# Patient Record
Sex: Female | Born: 1945
Health system: Southern US, Community
[De-identification: ages and names within clinical notes are randomized; demographics above are authoritative.]

## PROBLEM LIST (undated history)

## (undated) DIAGNOSIS — I10 Essential (primary) hypertension: Secondary | ICD-10-CM

## (undated) DIAGNOSIS — E785 Hyperlipidemia, unspecified: Secondary | ICD-10-CM

## (undated) DIAGNOSIS — F0390 Unspecified dementia without behavioral disturbance: Secondary | ICD-10-CM

## (undated) DIAGNOSIS — I2510 Atherosclerotic heart disease of native coronary artery without angina pectoris: Secondary | ICD-10-CM

## (undated) DIAGNOSIS — M1990 Unspecified osteoarthritis, unspecified site: Secondary | ICD-10-CM

## (undated) DIAGNOSIS — I2584 Coronary atherosclerosis due to calcified coronary lesion: Secondary | ICD-10-CM

## (undated) DIAGNOSIS — N390 Urinary tract infection, site not specified: Secondary | ICD-10-CM

## (undated) DIAGNOSIS — J309 Allergic rhinitis, unspecified: Secondary | ICD-10-CM

## (undated) DIAGNOSIS — I350 Nonrheumatic aortic (valve) stenosis: Secondary | ICD-10-CM

## (undated) DIAGNOSIS — F418 Other specified anxiety disorders: Secondary | ICD-10-CM

## (undated) DIAGNOSIS — J449 Chronic obstructive pulmonary disease, unspecified: Secondary | ICD-10-CM

## (undated) DIAGNOSIS — K635 Polyp of colon: Secondary | ICD-10-CM

## (undated) HISTORY — DX: Essential (primary) hypertension: I10

## (undated) HISTORY — PX: OTHER SURGICAL HISTORY: SHX169

## (undated) HISTORY — DX: Atherosclerotic heart disease of native coronary artery without angina pectoris: I25.10

## (undated) HISTORY — PX: DENTAL SURGERY: SHX609

## (undated) HISTORY — DX: Unspecified osteoarthritis, unspecified site: M19.90

## (undated) HISTORY — DX: Polyp of colon: K63.5

## (undated) HISTORY — DX: Hyperlipidemia, unspecified: E78.5

## (undated) HISTORY — DX: Other specified anxiety disorders: F41.8

## (undated) HISTORY — DX: Urinary tract infection, site not specified: N39.0

## (undated) HISTORY — DX: Chronic obstructive pulmonary disease, unspecified: J44.9

## (undated) HISTORY — DX: Nonrheumatic aortic (valve) stenosis: I35.0

## (undated) HISTORY — PX: TONSILLECTOMY: SUR1361

## (undated) HISTORY — DX: Allergic rhinitis, unspecified: J30.9

## (undated) HISTORY — DX: Coronary atherosclerosis due to calcified coronary lesion: I25.84

---

## 1999-02-17 ENCOUNTER — Other Ambulatory Visit: Admission: RE | Admit: 1999-02-17 | Discharge: 1999-02-17 | Payer: Self-pay | Admitting: Family Medicine

## 2000-10-03 ENCOUNTER — Other Ambulatory Visit: Admission: RE | Admit: 2000-10-03 | Discharge: 2000-10-03 | Payer: Self-pay | Admitting: Family Medicine

## 2001-07-06 ENCOUNTER — Emergency Department (HOSPITAL_COMMUNITY): Admission: EM | Admit: 2001-07-06 | Discharge: 2001-07-06 | Payer: Self-pay | Admitting: Emergency Medicine

## 2001-07-07 ENCOUNTER — Encounter: Payer: Self-pay | Admitting: Emergency Medicine

## 2003-12-30 ENCOUNTER — Other Ambulatory Visit: Admission: RE | Admit: 2003-12-30 | Discharge: 2003-12-30 | Payer: Self-pay | Admitting: Family Medicine

## 2004-09-19 ENCOUNTER — Ambulatory Visit: Payer: Self-pay | Admitting: Family Medicine

## 2005-01-02 ENCOUNTER — Ambulatory Visit: Payer: Self-pay | Admitting: Family Medicine

## 2005-01-16 ENCOUNTER — Ambulatory Visit: Payer: Self-pay | Admitting: Family Medicine

## 2005-07-26 ENCOUNTER — Ambulatory Visit: Payer: Self-pay | Admitting: Family Medicine

## 2005-10-23 ENCOUNTER — Ambulatory Visit: Payer: Self-pay | Admitting: Family Medicine

## 2005-12-05 ENCOUNTER — Ambulatory Visit: Payer: Self-pay | Admitting: Family Medicine

## 2006-01-10 ENCOUNTER — Ambulatory Visit: Payer: Self-pay | Admitting: Family Medicine

## 2006-01-17 ENCOUNTER — Other Ambulatory Visit: Admission: RE | Admit: 2006-01-17 | Discharge: 2006-01-17 | Payer: Self-pay | Admitting: Family Medicine

## 2006-01-17 ENCOUNTER — Ambulatory Visit: Payer: Self-pay | Admitting: Family Medicine

## 2006-01-17 ENCOUNTER — Encounter: Payer: Self-pay | Admitting: Family Medicine

## 2006-01-17 LAB — CONVERTED CEMR LAB

## 2006-01-24 ENCOUNTER — Ambulatory Visit: Payer: Self-pay | Admitting: Family Medicine

## 2006-04-24 ENCOUNTER — Ambulatory Visit: Payer: Self-pay | Admitting: Family Medicine

## 2006-04-24 LAB — CONVERTED CEMR LAB
ALT: 27 units/L (ref 0–40)
AST: 26 units/L (ref 0–37)
Albumin: 3.6 g/dL (ref 3.5–5.2)
Alkaline Phosphatase: 60 units/L (ref 39–117)
Bilirubin, Direct: 0.2 mg/dL (ref 0.0–0.3)
Chol/HDL Ratio, serum: 4
Cholesterol: 208 mg/dL (ref 0–200)
HDL: 51.5 mg/dL (ref >39.0)
LDL DIRECT: 124.5 mg/dL
Total Bilirubin: 0.6 mg/dL (ref 0.3–1.2)
Total Protein: 6.9 g/dL (ref 6.0–8.3)
Triglyceride fasting, serum: 156 mg/dL — ABNORMAL HIGH (ref 0–149)
VLDL: 31 mg/dL (ref 0–40)

## 2006-12-31 ENCOUNTER — Encounter: Payer: Self-pay | Admitting: Family Medicine

## 2006-12-31 DIAGNOSIS — J309 Allergic rhinitis, unspecified: Secondary | ICD-10-CM | POA: Insufficient documentation

## 2006-12-31 DIAGNOSIS — E785 Hyperlipidemia, unspecified: Secondary | ICD-10-CM | POA: Insufficient documentation

## 2007-01-23 ENCOUNTER — Ambulatory Visit: Payer: Self-pay | Admitting: Family Medicine

## 2007-01-23 LAB — CONVERTED CEMR LAB
ALT: 40 units/L — ABNORMAL HIGH (ref 0–35)
AST: 34 units/L (ref 0–37)
Albumin: 3.6 g/dL (ref 3.5–5.2)
Alkaline Phosphatase: 61 units/L (ref 39–117)
BUN: 10 mg/dL (ref 6–23)
Basophils Absolute: 0.1 10*3/uL (ref 0.0–0.1)
Basophils Relative: 1.1 % — ABNORMAL HIGH (ref 0.0–1.0)
Bilirubin, Direct: 0.1 mg/dL (ref 0.0–0.3)
CO2: 27 meq/L (ref 19–32)
Calcium: 8.9 mg/dL (ref 8.4–10.5)
Chloride: 105 meq/L (ref 96–112)
Cholesterol: 189 mg/dL (ref 0–200)
Creatinine, Ser: 0.8 mg/dL (ref 0.4–1.2)
Direct LDL: 96.7 mg/dL
Eosinophils Absolute: 0.3 10*3/uL (ref 0.0–0.6)
Eosinophils Relative: 5.4 % — ABNORMAL HIGH (ref 0.0–5.0)
GFR calc Af Amer: 93 mL/min
GFR calc non Af Amer: 77 mL/min
Glucose, Bld: 79 mg/dL (ref 70–99)
HCT: 40.3 % (ref 36.0–46.0)
HDL: 59.4 mg/dL (ref >39.0)
Hemoglobin: 14.2 g/dL (ref 12.0–15.0)
Lymphocytes Relative: 43.5 % (ref 12.0–46.0)
MCHC: 35.3 g/dL (ref 30.0–36.0)
MCV: 92.7 fL (ref 78.0–100.0)
Monocytes Absolute: 0.5 10*3/uL (ref 0.2–0.7)
Monocytes Relative: 11.2 % — ABNORMAL HIGH (ref 3.0–11.0)
Neutro Abs: 1.8 10*3/uL (ref 1.4–7.7)
Neutrophils Relative %: 38.8 % — ABNORMAL LOW (ref 43.0–77.0)
Platelets: 287 10*3/uL (ref 150–400)
Potassium: 4.2 meq/L (ref 3.5–5.1)
RBC: 4.34 M/uL (ref 3.87–5.11)
RDW: 13.2 % (ref 11.5–14.6)
Sodium: 139 meq/L (ref 135–145)
TSH: 1.89 microintl units/mL (ref 0.35–5.50)
Total Bilirubin: 0.8 mg/dL (ref 0.3–1.2)
Total CHOL/HDL Ratio: 3.2
Total Protein: 6.8 g/dL (ref 6.0–8.3)
Triglycerides: 342 mg/dL (ref 0–149)
VLDL: 68 mg/dL — ABNORMAL HIGH (ref 0–40)
WBC: 4.7 10*3/uL (ref 4.5–10.5)

## 2007-01-30 ENCOUNTER — Ambulatory Visit: Payer: Self-pay | Admitting: Family Medicine

## 2007-01-30 ENCOUNTER — Encounter: Payer: Self-pay | Admitting: Family Medicine

## 2007-01-30 ENCOUNTER — Other Ambulatory Visit: Admission: RE | Admit: 2007-01-30 | Discharge: 2007-01-30 | Payer: Self-pay | Admitting: Family Medicine

## 2007-01-30 DIAGNOSIS — J438 Other emphysema: Secondary | ICD-10-CM | POA: Insufficient documentation

## 2007-02-05 ENCOUNTER — Encounter: Payer: Self-pay | Admitting: Family Medicine

## 2007-06-02 ENCOUNTER — Encounter: Payer: Self-pay | Admitting: Family Medicine

## 2008-02-03 ENCOUNTER — Ambulatory Visit: Payer: Self-pay | Admitting: Family Medicine

## 2008-02-03 LAB — CONVERTED CEMR LAB
Bilirubin Urine: NEGATIVE
Glucose, Urine, Semiquant: NEGATIVE
Ketones, urine, test strip: NEGATIVE
Nitrite: NEGATIVE
Protein, U semiquant: NEGATIVE
Specific Gravity, Urine: 1.01
Urobilinogen, UA: 0.2
WBC Urine, dipstick: NEGATIVE
pH: 5.5

## 2008-02-10 ENCOUNTER — Ambulatory Visit: Payer: Self-pay | Admitting: Family Medicine

## 2008-02-10 DIAGNOSIS — E663 Overweight: Secondary | ICD-10-CM | POA: Insufficient documentation

## 2008-02-10 DIAGNOSIS — F341 Dysthymic disorder: Secondary | ICD-10-CM | POA: Insufficient documentation

## 2008-02-10 DIAGNOSIS — J449 Chronic obstructive pulmonary disease, unspecified: Secondary | ICD-10-CM | POA: Insufficient documentation

## 2008-02-10 LAB — CONVERTED CEMR LAB
ALT: 69 units/L — ABNORMAL HIGH (ref 0–35)
AST: 46 units/L — ABNORMAL HIGH (ref 0–37)
Albumin: 3.8 g/dL (ref 3.5–5.2)
Alkaline Phosphatase: 61 units/L (ref 39–117)
BUN: 12 mg/dL (ref 6–23)
Basophils Absolute: 0 10*3/uL (ref 0.0–0.1)
Basophils Relative: 0.9 % (ref 0.0–3.0)
Bilirubin, Direct: 0.1 mg/dL (ref 0.0–0.3)
CO2: 26 meq/L (ref 19–32)
Calcium: 9.2 mg/dL (ref 8.4–10.5)
Chloride: 103 meq/L (ref 96–112)
Cholesterol: 210 mg/dL (ref 0–200)
Creatinine, Ser: 0.8 mg/dL (ref 0.4–1.2)
Direct LDL: 105.6 mg/dL
Eosinophils Absolute: 0.2 10*3/uL (ref 0.0–0.7)
Eosinophils Relative: 3.3 % (ref 0.0–5.0)
GFR calc Af Amer: 93 mL/min
GFR calc non Af Amer: 77 mL/min
Glucose, Bld: 102 mg/dL — ABNORMAL HIGH (ref 70–99)
HCT: 41.7 % (ref 36.0–46.0)
HDL: 61.5 mg/dL (ref >39.0)
Hemoglobin: 14.4 g/dL (ref 12.0–15.0)
Lymphocytes Relative: 31.8 % (ref 12.0–46.0)
MCHC: 34.5 g/dL (ref 30.0–36.0)
MCV: 97.6 fL (ref 78.0–100.0)
Monocytes Absolute: 0.5 10*3/uL (ref 0.1–1.0)
Monocytes Relative: 9.9 % (ref 3.0–12.0)
Neutro Abs: 2.6 10*3/uL (ref 1.4–7.7)
Neutrophils Relative %: 54.1 % (ref 43.0–77.0)
Platelets: 272 10*3/uL (ref 150–400)
Potassium: 4.4 meq/L (ref 3.5–5.1)
RBC: 4.27 M/uL (ref 3.87–5.11)
RDW: 13.3 % (ref 11.5–14.6)
Sodium: 137 meq/L (ref 135–145)
TSH: 1.3 microintl units/mL (ref 0.35–5.50)
Total Bilirubin: 0.9 mg/dL (ref 0.3–1.2)
Total CHOL/HDL Ratio: 3.4
Total Protein: 7 g/dL (ref 6.0–8.3)
Triglycerides: 221 mg/dL (ref 0–149)
VLDL: 44 mg/dL — ABNORMAL HIGH (ref 0–40)
WBC: 4.9 10*3/uL (ref 4.5–10.5)

## 2008-02-13 ENCOUNTER — Ambulatory Visit: Payer: Self-pay | Admitting: Gastroenterology

## 2008-02-13 LAB — CONVERTED CEMR LAB: Vit D, 1,25-Dihydroxy: 8 — ABNORMAL LOW (ref 30–89)

## 2008-02-23 ENCOUNTER — Encounter: Payer: Self-pay | Admitting: Gastroenterology

## 2008-02-23 ENCOUNTER — Telehealth: Payer: Self-pay | Admitting: Gastroenterology

## 2008-02-27 ENCOUNTER — Ambulatory Visit: Payer: Self-pay | Admitting: Gastroenterology

## 2008-02-27 ENCOUNTER — Encounter: Payer: Self-pay | Admitting: Gastroenterology

## 2008-02-27 LAB — HM COLONOSCOPY

## 2008-03-02 ENCOUNTER — Encounter: Payer: Self-pay | Admitting: Gastroenterology

## 2008-03-16 ENCOUNTER — Telehealth: Payer: Self-pay | Admitting: Family Medicine

## 2008-03-31 ENCOUNTER — Ambulatory Visit: Payer: Self-pay | Admitting: Family Medicine

## 2008-04-06 ENCOUNTER — Ambulatory Visit: Payer: Self-pay | Admitting: Family Medicine

## 2008-04-06 DIAGNOSIS — R062 Wheezing: Secondary | ICD-10-CM | POA: Insufficient documentation

## 2008-04-27 ENCOUNTER — Ambulatory Visit: Payer: Self-pay | Admitting: Family Medicine

## 2008-06-22 ENCOUNTER — Telehealth: Payer: Self-pay | Admitting: Family Medicine

## 2008-06-23 ENCOUNTER — Encounter: Payer: Self-pay | Admitting: Family Medicine

## 2008-07-07 ENCOUNTER — Telehealth: Payer: Self-pay | Admitting: Family Medicine

## 2008-07-08 ENCOUNTER — Ambulatory Visit: Payer: Self-pay | Admitting: Family Medicine

## 2008-10-21 ENCOUNTER — Ambulatory Visit: Payer: Self-pay | Admitting: Family Medicine

## 2008-10-21 DIAGNOSIS — M479 Spondylosis, unspecified: Secondary | ICD-10-CM | POA: Insufficient documentation

## 2008-11-04 ENCOUNTER — Ambulatory Visit: Payer: Self-pay | Admitting: Family Medicine

## 2009-01-05 ENCOUNTER — Telehealth: Payer: Self-pay | Admitting: Family Medicine

## 2009-01-20 ENCOUNTER — Telehealth: Payer: Self-pay | Admitting: Family Medicine

## 2009-02-24 ENCOUNTER — Ambulatory Visit: Payer: Self-pay | Admitting: Family Medicine

## 2009-03-01 ENCOUNTER — Ambulatory Visit: Payer: Self-pay | Admitting: Family Medicine

## 2009-03-01 ENCOUNTER — Other Ambulatory Visit: Admission: RE | Admit: 2009-03-01 | Discharge: 2009-03-01 | Payer: Self-pay | Admitting: Family Medicine

## 2009-03-01 ENCOUNTER — Encounter: Payer: Self-pay | Admitting: Family Medicine

## 2009-03-01 LAB — CONVERTED CEMR LAB
ALT: 28 units/L (ref 0–35)
AST: 27 units/L (ref 0–37)
Albumin: 3.8 g/dL (ref 3.5–5.2)
Alkaline Phosphatase: 56 units/L (ref 39–117)
BUN: 14 mg/dL (ref 6–23)
Basophils Absolute: 0 10*3/uL (ref 0.0–0.1)
Basophils Relative: 0.7 % (ref 0.0–3.0)
Bilirubin, Direct: 0 mg/dL (ref 0.0–0.3)
CO2: 27 meq/L (ref 19–32)
Calcium: 9.2 mg/dL (ref 8.4–10.5)
Chloride: 106 meq/L (ref 96–112)
Cholesterol: 183 mg/dL (ref 0–200)
Creatinine, Ser: 0.9 mg/dL (ref 0.4–1.2)
Eosinophils Absolute: 0.2 10*3/uL (ref 0.0–0.7)
Eosinophils Relative: 3.8 % (ref 0.0–5.0)
GFR calc non Af Amer: 66.69 mL/min (ref >60)
Glucose, Bld: 112 mg/dL — ABNORMAL HIGH (ref 70–99)
Glucose, Urine, Semiquant: NEGATIVE
HCT: 43.3 % (ref 36.0–46.0)
HDL: 54.1 mg/dL (ref >39.00)
Hemoglobin: 14.5 g/dL (ref 12.0–15.0)
Ketones, urine, test strip: NEGATIVE
LDL Cholesterol: 102 mg/dL — ABNORMAL HIGH (ref 0–99)
Lymphocytes Relative: 36.6 % (ref 12.0–46.0)
Lymphs Abs: 2.3 10*3/uL (ref 0.7–4.0)
MCHC: 33.4 g/dL (ref 30.0–36.0)
MCV: 100.9 fL — ABNORMAL HIGH (ref 78.0–100.0)
Monocytes Absolute: 0.7 10*3/uL (ref 0.1–1.0)
Monocytes Relative: 10.5 % (ref 3.0–12.0)
Neutro Abs: 3.2 10*3/uL (ref 1.4–7.7)
Neutrophils Relative %: 48.4 % (ref 43.0–77.0)
Nitrite: NEGATIVE
Platelets: 292 10*3/uL (ref 150.0–400.0)
Potassium: 4.6 meq/L (ref 3.5–5.1)
RBC: 4.29 M/uL (ref 3.87–5.11)
RDW: 12.3 % (ref 11.5–14.6)
Sodium: 140 meq/L (ref 135–145)
Specific Gravity, Urine: 1.025
TSH: 1.05 microintl units/mL (ref 0.35–5.50)
Total Bilirubin: 0.6 mg/dL (ref 0.3–1.2)
Total CHOL/HDL Ratio: 3
Total Protein: 7 g/dL (ref 6.0–8.3)
Triglycerides: 134 mg/dL (ref 0.0–149.0)
Urobilinogen, UA: 0.2
VLDL: 26.8 mg/dL (ref 0.0–40.0)
WBC: 6.4 10*3/uL (ref 4.5–10.5)
pH: 5.5

## 2009-03-01 LAB — HM PAP SMEAR

## 2009-04-11 LAB — CONVERTED CEMR LAB: Pap Smear: NORMAL

## 2009-07-24 ENCOUNTER — Emergency Department (HOSPITAL_COMMUNITY): Admission: EM | Admit: 2009-07-24 | Discharge: 2009-07-24 | Payer: Self-pay | Admitting: Emergency Medicine

## 2009-08-31 ENCOUNTER — Encounter: Payer: Self-pay | Admitting: Family Medicine

## 2010-01-11 ENCOUNTER — Telehealth: Payer: Self-pay | Admitting: Family Medicine

## 2010-02-23 ENCOUNTER — Ambulatory Visit: Payer: Self-pay | Admitting: Family Medicine

## 2010-03-01 LAB — CONVERTED CEMR LAB
ALT: 23 units/L (ref 0–35)
AST: 23 units/L (ref 0–37)
Albumin: 3.9 g/dL (ref 3.5–5.2)
Alkaline Phosphatase: 43 units/L (ref 39–117)
BUN: 17 mg/dL (ref 6–23)
Basophils Absolute: 0.1 10*3/uL (ref 0.0–0.1)
Basophils Relative: 1 % (ref 0.0–3.0)
Bilirubin, Direct: 0.1 mg/dL (ref 0.0–0.3)
CO2: 23 meq/L (ref 19–32)
Calcium: 8.9 mg/dL (ref 8.4–10.5)
Chloride: 102 meq/L (ref 96–112)
Cholesterol: 212 mg/dL — ABNORMAL HIGH (ref 0–200)
Creatinine, Ser: 0.8 mg/dL (ref 0.4–1.2)
Direct LDL: 139.2 mg/dL
Eosinophils Absolute: 0.1 10*3/uL (ref 0.0–0.7)
Eosinophils Relative: 1.9 % (ref 0.0–5.0)
GFR calc non Af Amer: 72.99 mL/min (ref >60)
Glucose, Bld: 99 mg/dL (ref 70–99)
Glucose, Urine, Semiquant: NEGATIVE
HCT: 41.2 % (ref 36.0–46.0)
HDL: 48.8 mg/dL (ref >39.00)
Hemoglobin: 13.9 g/dL (ref 12.0–15.0)
Ketones, urine, test strip: NEGATIVE
Lymphocytes Relative: 28 % (ref 12.0–46.0)
Lymphs Abs: 1.8 10*3/uL (ref 0.7–4.0)
MCHC: 33.8 g/dL (ref 30.0–36.0)
MCV: 98.8 fL (ref 78.0–100.0)
Monocytes Absolute: 0.5 10*3/uL (ref 0.1–1.0)
Monocytes Relative: 7.6 % (ref 3.0–12.0)
Neutro Abs: 3.8 10*3/uL (ref 1.4–7.7)
Neutrophils Relative %: 61.5 % (ref 43.0–77.0)
Nitrite: NEGATIVE
Platelets: 318 10*3/uL (ref 150.0–400.0)
Potassium: 4.2 meq/L (ref 3.5–5.1)
RBC: 4.17 M/uL (ref 3.87–5.11)
RDW: 13.5 % (ref 11.5–14.6)
Sodium: 135 meq/L (ref 135–145)
Specific Gravity, Urine: 1.02
TSH: 0.9 microintl units/mL (ref 0.35–5.50)
Total Bilirubin: 0.8 mg/dL (ref 0.3–1.2)
Total CHOL/HDL Ratio: 4
Total Protein: 6.4 g/dL (ref 6.0–8.3)
Triglycerides: 167 mg/dL — ABNORMAL HIGH (ref 0.0–149.0)
Urobilinogen, UA: 0.2
VLDL: 33.4 mg/dL (ref 0.0–40.0)
WBC: 6.3 10*3/uL (ref 4.5–10.5)
pH: 5.5

## 2010-05-01 ENCOUNTER — Telehealth: Payer: Self-pay | Admitting: Family Medicine

## 2010-05-30 ENCOUNTER — Telehealth: Payer: Self-pay | Admitting: Internal Medicine

## 2010-05-30 ENCOUNTER — Telehealth: Payer: Self-pay | Admitting: Family Medicine

## 2010-06-05 ENCOUNTER — Telehealth: Payer: Self-pay | Admitting: Family Medicine

## 2010-06-20 ENCOUNTER — Ambulatory Visit: Payer: Self-pay | Admitting: Family Medicine

## 2010-06-20 DIAGNOSIS — R55 Syncope and collapse: Secondary | ICD-10-CM | POA: Insufficient documentation

## 2010-06-20 DIAGNOSIS — N390 Urinary tract infection, site not specified: Secondary | ICD-10-CM | POA: Insufficient documentation

## 2010-06-20 LAB — CONVERTED CEMR LAB
Blood in Urine, dipstick: NEGATIVE
Glucose, Urine, Semiquant: NEGATIVE
Nitrite: NEGATIVE
Specific Gravity, Urine: 1.02
Urobilinogen, UA: 0.2
WBC Urine, dipstick: NEGATIVE
pH: 5.5

## 2010-06-20 LAB — HM MAMMOGRAPHY

## 2010-07-11 ENCOUNTER — Telehealth: Payer: Self-pay | Admitting: Family Medicine

## 2010-08-08 NOTE — Progress Notes (Signed)
Summary: Alprazolam refill  Phone Note Refill Request Message from:  Fax from Pharmacy on May 30, 2010 9:58 AM  Refills Requested: Medication #1:  ALPRAZOLAM 0.25 MG TABS Take 1 tablet by mouth three times a day as needed stress   Dosage confirmed as above?Dosage Confirmed  Medication #2:  HYDROCODONE-ACETAMINOPHEN 10-650 MG TABS 1 q4-6 as needed pain not to exceed 4 per day   Dosage confirmed as above?Dosage Confirmed Please advise the Alprazolam refill? The Hydrocodone is to early! The last RX was wrote on 01/31/10 #120X5.  Initial call taken by: Josph Macho RMA,  May 30, 2010 9:59 AM  Follow-up for Phone Call        I agree with no Hydrocodone and OK to give 90 more Alprazolam with same sig and no further refills Follow-up by: Danise Edge MD,  May 30, 2010 10:12 AM    Prescriptions: ALPRAZOLAM 0.25 MG TABS (ALPRAZOLAM) Take 1 tablet by mouth three times a day as needed stress  #90 x 0   Entered by:   Josph Macho RMA   Authorized by:   Danise Edge MD   Signed by:   Josph Macho RMA on 05/30/2010   Method used:   Telephoned to ...       [facility]* (retail)       [address]       [place], [place]  [id number]       Ph: [phone]       Fax: [phone]   RxID:   [id number]  I left message on pharmacy vm/ CF

## 2010-08-08 NOTE — Miscellaneous (Signed)
  Clinical Lists Changes  Medications: Added new medication of MOVIPREP 100 GM  SOLR (PEG-KCL-NACL-NASULF-NA ASC-C) As per prep instructions. - Signed Rx of MOVIPREP 100 GM  SOLR (PEG-KCL-NACL-NASULF-NA ASC-C) As per prep instructions.;  #1 x 0;  Signed;  Entered by: Barton Fanny RN;  Authorized by: Meryl Dare MD [facility];  Method used: Electronic Observations: Added new observation of NKA: T (02/23/2008 15:17)    Prescriptions: MOVIPREP 100 GM  SOLR (PEG-KCL-NACL-NASULF-NA ASC-C) As per prep instructions.  #1 x 0   Entered by:   Barton Fanny RN   Authorized by:   Meryl Dare MD [facility]   Signed by:   Barton Fanny RN on 02/23/2008   Method used:   Electronically sent to ...       [facility]*       [address]       [place], [place]  [address]       Ph: [phone]       Fax: [phone]   RxID:   [id number]

## 2010-08-08 NOTE — Progress Notes (Signed)
Summary: Requesting OV  Phone Note Call from Patient Call back at Work Phone [phone]   Caller: Patient Call For: Andrea Sheen MD Summary of Call: Pt requesting OV with Dr Scotty Court today or tomorrow in follow-up to illness during Christmas.  Pt was not able to get OV with Dr Scotty Court, so her daughter in law (physician) prescribed Avelox 400 mg X 7 days, she finished this yesterday.  Pt still suffering from hoarseness, cough and major fatigue and would like to be checked out by Dr Scotty Court.  Denies fever, wheezing, SOB. Initial call taken by: Sid Falcon LPN,  July 07, 2008 12:48 PM  Follow-up for Phone Call        ok appt tomorrow Follow-up by: Pura Spice, RN,  July 07, 2008 1:00 PM  Additional Follow-up for Phone Call Additional follow up Details #1::        Scheduled OV 9am tomorrow per Dr Scotty Court Additional Follow-up by: Sid Falcon LPN,  July 07, 2008 1:01 PM

## 2010-08-08 NOTE — Assessment & Plan Note (Signed)
Summary: right arm and shoulder pain//ccm   Vital Signs:  Patient profile:   65 year old female Height:      62.5 inches Weight:      157 pounds BMI:     28.36 O2 Sat:      96 % Temp:     97.9 degrees F Pulse rate:   112 / minute BP sitting:   140 / 84  (left arm)  Vitals Entered By: Pura Spice, RN (October 21, 2008 1:11 PM) CC: pain rt shoulder  --icy hot patches helped    History of Present Illness: Pt relates she has had shoulder pain since last of Feb and increaing in severity, on question find pain also referred from rt lateral neck, thought she injured lifting on objects BP doing fine relates breathing bbetter since using spiriva and symbocort  Allergies (verified): No Known Drug Allergies  Review of Systems      See HPI General:  Denies chills, fatigue, fever, loss of appetite, malaise, sleep disorder, sweats, weakness, and weight loss. ENT:  Denies decreased hearing, difficulty swallowing, ear discharge, earache, hoarseness, nasal congestion, nosebleeds, postnasal drainage, ringing in ears, sinus pressure, and sore throat. CV:  Denies bluish discoloration of lips or nails, chest pain or discomfort, difficulty breathing at night, difficulty breathing while lying down, fainting, fatigue, leg cramps with exertion, lightheadness, near fainting, palpitations, shortness of breath with exertion, swelling of feet, swelling of hands, and weight gain. Resp:  Denies chest discomfort, chest pain with inspiration, cough, coughing up blood, excessive snoring, hypersomnolence, morning headaches, pleuritic, shortness of breath, sputum productive, and wheezing. GI:  Denies abdominal pain, bloody stools, change in bowel habits, constipation, dark tarry stools, diarrhea, excessive appetite, gas, hemorrhoids, indigestion, loss of appetite, nausea, vomiting, vomiting blood, and yellowish skin color. GU:  Denies abnormal vaginal bleeding, decreased libido, discharge, dysuria, genital sores,  hematuria, incontinence, nocturia, urinary frequency, and urinary hesitancy. MS:  Complains of joint pain; referred shoulder pain.  Physical Exam  General:  Well-developed,well-nourished,in no acute distress; alert,appropriate and cooperative throughout examination Neck:  marked tenderness rt lateral cervical spine 4-7 Msk:  shoulder neg, elbow neg Pulses:  R and L carotid,radial,femoral,dorsalis pedis and posterior tibial pulses are full and equal bilaterally   Impression & Recommendations:  Problem # 1:  DEGENERATIVE JOINT DISEASE, CERVICAL SPINE (ICD-721.90) Assessment New  Orders: T-Cervical Spine Comp 4 Views (16109UE) Depo- Medrol 80mg  (J1040) Admin of Therapeutic Inj  intramuscular or subcutaneous (45409) diclofenac 75 mg qd  Problem # 2:  COPD (ICD-496) Assessment: Improved  Her updated medication list for this problem includes:    Singulair 10 Mg Tabs (Montelukast sodium) .Marland Kitchen... Take 1 tablet by mouth once a day    Proair Hfa 108 (90 Base) Mcg/act Aers (Albuterol sulfate) .Marland Kitchen... Tale 2 inhalations three times a day  when needed    Spiriva Handihaler 18 Mcg Caps (Tiotropium bromide monohydrate) ..... One puff at bedtime    Symbicort 160-4.5 Mcg/act Aero (Budesonide-formoterol fumarate) .Marland Kitchen... 2 inhalations bid  Problem # 3:  ELEVATED BLOOD PRESSURE (ICD-796.2) Assessment: Improved  Problem # 4:  EMPHYSEMA NEC (ICD-492.8) Assessment: Improved  Complete Medication List: 1)  Alprazolam 0.25 Mg Tabs (Alprazolam) .... Take 1 tablet by mouth three times a day as needed stress 2)  Aspirin 81 Mg Tbec (Aspirin) .... Take 1 tablet by mouth once a day 3)  Caltrate 600+d Plus 600-400 Mg-unit Tabs (Calcium carbonate-vit d-min) .... Take 1 tablet by mouth twice a day 4)  Crestor  10 Mg Tabs (Rosuvastatin calcium) .... Take 1 tablet by mouth every day 5)  Prozac 20 Mg Caps (Fluoxetine hcl) .... Take 1 capsule by mouth once a day 6)  Singulair 10 Mg Tabs (Montelukast sodium) .... Take  1 tablet by mouth once a day 7)  Zetia 10 Mg Tabs (Ezetimibe) .... Take 1 tablet by mouth once a day 8)  Proair Hfa 108 (90 Base) Mcg/act Aers (Albuterol sulfate) .... Tale 2 inhalations three times a day  when needed 9)  Spiriva Handihaler 18 Mcg Caps (Tiotropium bromide monohydrate) .... One puff at bedtime 10)  Vitamin D 16109 Unit Caps (Ergocalciferol) .Marland Kitchen.. 1 by mouth weekly 11)  Symbicort 160-4.5 Mcg/act Aero (Budesonide-formoterol fumarate) .... 2 inhalations bid 12)  Hydrocodone-homatropine 5-1.5 Mg/2ml Syrp (Hydrocodone-homatropine) .... 2 tsp q4h as needed cough 13)  Levaquin 750 Mg Tabs (Levofloxacin) .Marland Kitchen.. 1 qd 14)  Diclofenac Sodium 75 Mg Tbec (Diclofenac sodium) .Marland Kitchen.. 1 two times a day  for inflammation 15)  Hydrocodone-acetaminophen 10-650 Mg Tabs (Hydrocodone-acetaminophen) .Marland Kitchen.. 1 q4-6 as needed pain  Patient Instructions: 1)  To Xray cervical spine 2)  Depomedrol 120 mg IMstart diclofenac 75 mg two times a day 3)  alternate cod and heat therapy 4)  You need to lose weight. Consider a lower calorie diet and regular exercise.  Prescriptions: HYDROCODONE-ACETAMINOPHEN 10-650 MG TABS (HYDROCODONE-ACETAMINOPHEN) 1 q4-6 as needed pain  #100 x 3   Entered and Authorized by:   Judithann Sheen MD   Signed by:   Judithann Sheen MD on 10/21/2008   Method used:   Print then Give to Patient   RxID:   [id number] SYMBICORT 160-4.5 MCG/ACT AERO (BUDESONIDE-FORMOTEROL FUMARATE) 2 inhalations bid  #1 x 11   Entered and Authorized by:   Judithann Sheen MD   Signed by:   Judithann Sheen MD on 10/21/2008   Method used:   Electronically to        [facility]* (retail)       [address]       [place], [place]  [address]       Ph: [phone]       Fax: [phone]   RxID:   [id number] DICLOFENAC SODIUM 75 MG TBEC (DICLOFENAC SODIUM) 1 two times a day  for inflammation  #60 x 11   Entered and Authorized by:   Judithann Sheen MD   Signed  by:   Judithann Sheen MD on 10/21/2008   Method used:   Electronically to        [facility]* (retail)       [address]       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 Patient Level IV [16109]

## 2010-08-08 NOTE — Assessment & Plan Note (Signed)
Summary: bronchitis?/mhf   Vital Signs:  Patient Profile:   65 Years Old Female Weight:      159 pounds Temp:     98.1 degrees F Pulse rate:   100 / minute BP sitting:   150 / 100  (left arm) Cuff size:   large  Vitals Entered By: Pura Spice, RN (April 06, 2008 2:40 PM)                 Chief Complaint:  ? bronchitis .  History of Present Illness: to recheck BP in 2 weeks In office they have been using chemicals to clean walls and this has caused pt to start wheexzing and coughing, taking usual COPD medications but getting worse with nasal drainagem, discolores and coughing and wheezing, general malaise,exertional dyspnea no other complaints    Current Allergies (reviewed today): No known allergies      Review of Systems      See HPI   Physical Exam  General:     Well-developed,well-nourished,in no acute distress; alert,appropriate and cooperative throughout examinationoverweight-appearing.   Head:     Normocephalic and atraumatic without obvious abnormalities. No apparent alopecia or balding. Eyes:     No corneal or conjunctival inflammation noted. EOMI. Perrla. Funduscopic exam benign, without hemorrhages, exudates or papilledema. Vision grossly normal. Ears:     External ear exam shows no significant lesions or deformities.  Otoscopic examination reveals clear canals, tympanic membranes are intact bilaterally without bulging, retraction, inflammation or discharge. Hearing is grossly normal bilaterally. Nose:     nasal congestion with yellow drainagre Mouth:     Oral mucosa and oropharynx without lesions or exudates.  Teeth in good repair. Lungs:     inspiratory and expiratory wheezes, de creased breathe sounds Heart:     Normal rate and regular rhythm. S1 and S2 normal without gallop, murmur, click, rub or other extra sounds. Abdomen:     Bowel sounds positive,abdomen soft and non-tender without masses, organomegaly or hernias noted. Extremities:    No clubbing, cyanosis, edema, or deformity noted with normal full range of motion of all joints.      Impression & Recommendations:  Problem # 1:  ASTHMATIC BRONCHITIS, ACUTE (ICD-466.0) Assessment: New  Her updated medication list for this problem includes:    Singulair 10 Mg Tabs (Montelukast sodium) .Marland Kitchen... Take 1 tablet by mouth once a day    Proair Hfa 108 (90 Base) Mcg/act Aers (Albuterol sulfate) .Marland Kitchen... Tale 2 inhalations three times a day  when needed    Spiriva Handihaler 18 Mcg Caps (Tiotropium bromide monohydrate) ..... One puff at bedtime    Symbicort 160-4.5 Mcg/act Aero (Budesonide-formoterol fumarate) .Marland Kitchen... 1 inhalation bid    Zithromax Z-pak 250 Mg Tabs (Azithromycin) .Marland Kitchen... 2 stat then 1  qd    Hydrocodone-homatropine 5-1.5 Mg/20ml Syrp (Hydrocodone-homatropine) .Marland Kitchen... 2 tsp q4h as needed cough  Orders: Depo- Medrol 80mg  (J1040) Admin of Therapeutic Inj  intramuscular or subcutaneous (16109)   Problem # 2:  WHEEZING (ICD-786.07) Assessment: New  Problem # 3:  ALLERGIC RHINITIS (ICD-477.9) Assessment: Deteriorated  Problem # 4:  EMPHYSEMA NEC (ICD-492.8) Assessment: Deteriorated  Complete Medication List: 1)  Alprazolam 0.25 Mg Tabs (Alprazolam) .... Take 1 tablet by mouth three times a day as needed stress 2)  Aspirin 81 Mg Tbec (Aspirin) .... Take 1 tablet by mouth once a day 3)  Caltrate 600+d Plus 600-400 Mg-unit Tabs (Calcium carbonate-vit d-min) .... Take 1 tablet by mouth twice a day 4)  Crestor  10 Mg Tabs (Rosuvastatin calcium) .... Take 1 tablet by mouth every day 5)  Prozac 20 Mg Caps (Fluoxetine hcl) .... Take 1 capsule by mouth once a day 6)  Singulair 10 Mg Tabs (Montelukast sodium) .... Take 1 tablet by mouth once a day 7)  Zetia 10 Mg Tabs (Ezetimibe) .... Take 1 tablet by mouth once a day 8)  Proair Hfa 108 (90 Base) Mcg/act Aers (Albuterol sulfate) .... Tale 2 inhalations three times a day  when needed 9)  Spiriva Handihaler 18 Mcg Caps (Tiotropium  bromide monohydrate) .... One puff at bedtime 10)  Vitamin D 04540 Unit Caps (Ergocalciferol) .Marland Kitchen.. 1 by mouth weekly 11)  Symbicort 160-4.5 Mcg/act Aero (Budesonide-formoterol fumarate) .Marland Kitchen.. 1 inhalation bid 12)  Zithromax Z-pak 250 Mg Tabs (Azithromycin) .... 2 stat then 1  qd 13)  Hydrocodone-homatropine 5-1.5 Mg/32ml Syrp (Hydrocodone-homatropine) .... 2 tsp q4h as needed cough   Patient Instructions: 1)  allerigic asthmatic bronchitis 2)  singular , spiriva as nbefore 3)  new symbicort wo/4.5 2 inalations morming and night 4)  wn 5)  en beter go to 1 inhalation 6)  early infection Zpack plus hycodanexpectorant 7)  Depomedrol 120 mg IM 8)  continue to dietand minimize smoking   Prescriptions: HYDROCODONE-HOMATROPINE 5-1.5 MG/5ML SYRP (HYDROCODONE-HOMATROPINE) 2 tsp q4h as needed cough  #280cc x 2   Entered and Authorized by:   Judithann Sheen MD   Signed by:   Judithann Sheen MD on 04/06/2008   Method used:   Print then Give to Patient   RxID:   [id number] ZITHROMAX Z-PAK 250 MG TABS (AZITHROMYCIN) 2 stat then 1  qd  #1 pkg x 0   Entered and Authorized by:   Judithann Sheen MD   Signed by:   Judithann Sheen MD on 04/06/2008   Method used:   Electronically to        [facility]* (retail)       [address]       [place], [place]  [address]       Ph: [phone]       Fax: [phone]   RxID:   [id number] ZETIA 10 MG TABS (EZETIMIBE) Take 1 tablet by mouth once a day  #30 x 11   Entered by:   Pura Spice, RN   Authorized by:   Judithann Sheen MD   Signed by:   Pura Spice, RN on 04/06/2008   Method used:   Telephoned to ...       [facility]* (retail)       [address]       [place], [place]  [address]       Ph: [phone]       Fax: [phone]   RxID:   [id number] PROZAC 20 MG CAPS (FLUOXETINE HCL) Take 1 capsule by mouth once a day  #30 x 11   Entered by:   Pura Spice, RN    Authorized by:   Judithann Sheen MD   Signed by:   Pura Spice, RN on 04/06/2008   Method used:   Telephoned to ...       [facility]* (retail)       [address]       [place], [place]  [address]       Ph: [phone]       Fax: [phone]   RxID:  [id number] CRESTOR 10 MG TABS (ROSUVASTATIN CALCIUM) Take 1 tablet by mouth every day  #30 x 11   Entered by:   Pura Spice, RN   Authorized by:   Judithann Sheen MD   Signed by:   Pura Spice, RN on 04/06/2008   Method used:   Telephoned to ...       [facility]* (retail)       [address]       [place], [place]  [address]       Ph: [phone]       Fax: [phone]   RxID:   [id number] ALPRAZOLAM 0.25 MG TABS (ALPRAZOLAM) Take 1 tablet by mouth three times a day as needed stress  #90 x 5   Entered by:   Pura Spice, RN   Authorized by:   Judithann Sheen MD   Signed by:   Pura Spice, RN on 04/06/2008   Method used:   Telephoned to ...       [facility]* (retail)       [address]       [place], [place]  [address]       Ph: [phone]       Fax: [phone]   RxID:   [id number]  ]  Medication Administration  Injection # 1:    Medication: Depo- Medrol 80mg     Diagnosis: ASTHMATIC BRONCHITIS, ACUTE (ICD-466.0)    Route: IM    Site: RUOQ gluteus    Exp Date: 05/2009    Lot #: [id number]    Mfr: sircor    Comments: 120 mg given     Patient tolerated injection without complications    Given by: Pura Spice, RN (April 06, 2008 4:41 PM)  Orders Added: 1)  Depo- Medrol 80mg  [J1040] 2)  Admin of Therapeutic Inj  intramuscular or subcutaneous [96372] 3)  Est. Patient Level III [17616]

## 2010-08-08 NOTE — Letter (Signed)
Summary: Results Follow up Letter  [place]  [address] [place], [place] [address]   Phone: [phone]  Fax: [phone]    02/05/2007 MRN: [id number]  [facility] [address] [place], [place]  [address]  Dear Andrea Larson,  The following are the results of your recent test(s):  Test         Result    Pap Smear:        Normal _____  Not Normal _____ Comments: ______________________________________________________ Cholesterol: LDL(Bad cholesterol):         Your goal is less than:         HDL (Good cholesterol):       Your goal is more than: Comments:  ______________________________________________________ Mammogram:        Normal _____  Not Normal _____ Comments:  ___________________________________________________________________ Hemoccult:        Normal _____  Not normal _______ Comments:    _____________________________________________________________________ Other Tests:    We routinely do not discuss normal results over the telephone.  If you desire a copy of the results, or you have any questions about this information we can discuss them at your next office visit.   Sincerely,

## 2010-08-08 NOTE — Progress Notes (Signed)
Summary: refill on hydrocodone-too earlier  Phone Note From Pharmacy   Caller: [facility]* Reason for Call: Needs renewal Details for Reason: Hydrocodone/apap 10/650 Summary of Call: Pt got #120 on 7/26, 8/19, 9/11, 10/4 and 10/28. She wants another refill.  Follow-up for Phone Call        Denied rx Pt got it filled on 8/19-6 days early; then on 9/11-6 days early; 10/4-8 days early; 10/28-6 days early. Pt should have enough for 26 more days. She is only suppose to take 4 a day. This would be a 30 day supply. Follow-up by: Romualdo Bolk, CMA Duncan Dull),  May 30, 2010 4:39 PM

## 2010-08-08 NOTE — Progress Notes (Signed)
Summary: Alprazolam refill  Phone Note Refill Request Message from:  Fax from Pharmacy on May 01, 2010 1:26 PM  Refills Requested: Medication #1:  ALPRAZOLAM 0.25 MG TABS Take 1 tablet by mouth three times a day as needed stress   Dosage confirmed as above?Dosage Confirmed Please advise refill?  Initial call taken by: Josph Macho RMA,  May 01, 2010 1:27 PM  Follow-up for Phone Call        OK to give with same sig #90, no rf Follow-up by: Danise Edge MD,  May 01, 2010 1:55 PM    Prescriptions: ALPRAZOLAM 0.25 MG TABS (ALPRAZOLAM) Take 1 tablet by mouth three times a day as needed stress  #90 x 0   Entered by:   Josph Macho RMA   Authorized by:   Danise Edge MD   Signed by:   Josph Macho RMA on 05/02/2010   Method used:   Telephoned to ...       [facility]* (retail)       [address]       [place], [place]  [address]       Ph: [phone]       Fax: [phone]   RxID:   [id number]

## 2010-08-08 NOTE — Progress Notes (Signed)
Summary: rx hydrocoodne w 5 refills   Phone Note From Pharmacy   Caller: [facility]* Reason for Call: Needs renewal Summary of Call: refill hydrocodone 10/650  Initial call taken by: Pura Spice, RN,  January 20, 2009 3:01 PM  Follow-up for Phone Call        ok per dr Scotty Court and faxed rx Follow-up by: Pura Spice, RN,  January 20, 2009 3:01 PM    New/Updated Medications: HYDROCODONE-ACETAMINOPHEN 10-650 MG TABS (HYDROCODONE-ACETAMINOPHEN) 1 q4-6 as needed pain not to exceed 4 per day Prescriptions: HYDROCODONE-ACETAMINOPHEN 10-650 MG TABS (HYDROCODONE-ACETAMINOPHEN) 1 q4-6 as needed pain not to exceed 4 per day  #100 x 5   Entered by:   Pura Spice, RN   Authorized by:   Judithann Sheen MD   Signed by:   Pura Spice, RN on 01/20/2009   Method used:   Printed then faxed to ...       [facility]* (retail)       [address]       [place], [place]  [address]       Ph: [phone]       Fax: [phone]   RxID:   [id number]

## 2010-08-08 NOTE — Procedures (Signed)
Summary: Colonoscopy   Colonoscopy  Procedure date:  02/27/2008  Findings:      Location:   [facility].    Procedures Next Due Date:    Colonoscopy: 02/2013  Patient Name: Andrea, Larson MRN:  Procedure Procedures: Colonoscopy CPT: 16109.    with biopsy. CPT: Q5068410.    with polypectomy. CPT: A3573898.  Personnel: Endoscopist: Venita Lick. Russella Dar, MD, Clementeen Graham.  Exam Location: Exam performed in Outpatient Clinic. Outpatient  Patient Consent: Procedure, Alternatives, Risks and Benefits discussed, consent obtained, from patient. Consent was obtained by the RN.  Indications  Surveillance of: Adenomatous Polyp(s). Initial polypectomy was performed in 2005. in Aug.  Increased Risk Screening: Family History of Polyps.  History  Current Medications: Patient is not currently taking Coumadin.  Pre-Exam Physical: Performed Feb 27, 2008. Cardio-pulmonary exam, Rectal exam, HEENT exam , Abdominal exam, Mental status exam WNL.  Comments: Pt. history reviewed/updated, physical exam performed prior to initiation of sedation?Yes Exam Exam: Extent of exam reached: Cecum, extent intended: Cecum.  The cecum was identified by appendiceal orifice and IC valve. Time to Cecum: 00:13: 01. Time for Withdrawl: 00:12:47. Colon retroflexion performed. Images taken. ASA Classification: II. Tolerance: excellent.  Monitoring: Pulse and BP monitoring, Oximetry used. Supplemental O2 given.  Colon Prep Used MoviPrep for colon prep. Prep results: good.  Sedation Meds: Patient assessed and found to be appropriate for moderate (conscious) sedation. Fentanyl 125 mcg. given IV. Versed 12 given IV. Benadryl 50mg  given IV.  Findings POLYP: Descending Colon, Maximum size: 10 mm. sessile polyp. Procedure:  snare with cautery, The polyp was removed piece meal. removed, retrieved, Polyp sent to pathology. ICD9: Colon Polyps: 211. 3.  NORMAL EXAM: Ascending Colon to Transverse Colon.  POLYP:  Descending Colon, Maximum size: 5 mm. sessile polyp. Procedure:  snare without cautery, removed, retrieved, sent to pathology. ICD9: Colon Polyps: 211.3.  - DIVERTICULOSIS: Descending Colon to Sigmoid Colon. Not bleeding. ICD9: Diverticulosis: 562.10.  POLYP: Cecum, Maximum size: 4 mm. sessile polyp. Procedure:  biopsy without cautery, removed, retrieved, sent to pathology. ICD9: Colon Polyps: 211.3.  NORMAL EXAM: Rectum.    Comments: tortuous and fixed colon Assessment  Diagnoses: 562.10: Diverticulosis.  211.3: Colon Polyps.   Events  Unplanned Interventions: No intervention was required.  Unplanned Events: There were no complications. Plans  Post Exam Instructions: Post sedation instructions given. No aspirin or non-steroidal containing medications: 2 weeks.  Medication Plan: Await pathology.  Patient Education: Patient given standard instructions for: Polyps. Diverticulosis.  Disposition: After procedure patient sent to recovery. After recovery patient sent home.  Scheduling/Referral: Colonoscopy, to [facility] T. Russella Dar, MD, [facility], around Feb 26, 2013.    cc:  Dianna Limbo, MD  REPORT OF SURGICAL PATHOLOGY   Case #: [id number] Patient Name: Andrea, Larson. Office Chart Number:  N/A   MRN: [id number] Pathologist: H. Hollice Espy, MD DOB/Age  Dec 19, 1945 (Age: 65)    Gender: F Date Taken:  02/27/2008 Date Received: 03/01/2008   FINAL DIAGNOSIS   ***MICROSCOPIC EXAMINATION AND DIAGNOSIS***   COLON, CECAL AND DESCENDING, POLYPS, BIOPSY:   - TUBULAR ADENOMA (TWO FRAGMENTS).        - HYPERPLASTIC POLYPS.  - NO HIGH GRADE DYSPLASIA OR MALIGNANCY IDENTIFIED.        COMMENT There is adenomatous epithelium having a predominantly tubular growth pattern consistent with a tubular adenoma if the biopsy is representative of the entire lesion. No high grade dysplasia or evidence of malignancy is identified. Clinical correlation is recommended.  HCL:mj  03/02/08)  mj Date Reported:  03/02/2008     H. Hollice Espy, MD *** Electronically Signed Out By [facility] ***  March 02, 2008 MRN: [id number]    [facility] [address] [place], [place]  [address]    Dear Ms. Burciaga,  I am pleased to inform you that the colon polyp(s) removed during your recent colonoscopy was (were) found to be benign (no cancer detected) upon pathologic examination.  I recommend you have a repeat colonoscopy examination in 5 years to look for recurrent polyps, as having colon polyps increases your risk for having recurrent polyps or even colon cancer in the future.  Should you develop new or worsening symptoms of abdominal pain, bowel habit changes or bleeding from the rectum or bowels, please schedule an evaluation with either your primary care physician or with me.  Continue treatment plan as outlined the day of your exam.  Please call us if you are having persistent problems or have questions about your condition that have not been fully answered at this time.  Sincerely,  Meryl Dare MD [facility]  This letter has been electronically signed by your physician.  This report was created from the original endoscopy report, which was reviewed and signed by the above listed endoscopist.

## 2010-08-08 NOTE — Assessment & Plan Note (Signed)
Summary: pneumonia shot/gina/mhf  Nurse Visit   Vitals Entered By: Pura Spice, RN (March 31, 2008 2:48 PM)                 Prior Medications: ALPRAZOLAM 0.25 MG TABS (ALPRAZOLAM) Take 1 tablet by mouth three times a day as needed stress  NEED TO CALL FOR APPT. ASPIRIN 81 MG TBEC (ASPIRIN) Take 1 tablet by mouth once a day CALTRATE 600+D PLUS 600-400 MG-UNIT TABS (CALCIUM CARBONATE-VIT D-MIN) Take 1 tablet by mouth twice a day CRESTOR 10 MG TABS (ROSUVASTATIN CALCIUM) Take 1 tablet by mouth every day PROZAC 20 MG CAPS (FLUOXETINE HCL) Take 1 capsule by mouth once a day SINGULAIR 10 MG TABS (MONTELUKAST SODIUM) Take 1 tablet by mouth once a day ZETIA 10 MG TABS (EZETIMIBE) Take 1 tablet by mouth once a day PROAIR HFA 108 (90 BASE) MCG/ACT  AERS (ALBUTEROL SULFATE) tale 2 inhalations three times a day  when needed SPIRIVA HANDIHALER 18 MCG  CAPS (TIOTROPIUM BROMIDE MONOHYDRATE) one puff at bedtime VITAMIN D 73710 UNIT  CAPS (ERGOCALCIFEROL) 1 by mouth weekly Current Allergies: No known allergies    Pneumovax Vaccine    Vaccine Type: Pneumovax    Site: right deltoid    Mfr: Merck    Dose: 0.5 ml    Route: IM    Given by: Pura Spice, RN    Exp. Date: 11/21/2008    Lot #: [id number]   Orders Added: 1)  Admin 1st Vaccine [90471] 2)  Flu Vaccine 78yrs + [62694] 3)  Pneumococcal Vaccine [85462] 4)  Admin of Any Addtl Vaccine [90472]   Impression & Recommendations: Flu Vaccine Consent Questions     Do you have a history of severe allergic reactions to this vaccine? no    Any prior history of allergic reactions to egg and/or gelatin? no    Do you have a sensitivity to the preservative Thimersol? no    Do you have a past history of Guillan-Barre Syndrome? no    Do you currently have an acute febrile illness? no    Have you ever had a severe reaction to latex? no    Vaccine information given and explained to patient? yes    Are you currently pregnant? no    Lot  Number:[id number]   Site Given  Left Deltoid IM  given by Pura Spice, RN  March 31, 2008 2:50 PM    Complete Medication List: 1)  Alprazolam 0.25 Mg Tabs (Alprazolam) .... Take 1 tablet by mouth three times a day as needed stress  need to call for appt. 2)  Aspirin 81 Mg Tbec (Aspirin) .... Take 1 tablet by mouth once a day 3)  Caltrate 600+d Plus 600-400 Mg-unit Tabs (Calcium carbonate-vit d-min) .... Take 1 tablet by mouth twice a day 4)  Crestor 10 Mg Tabs (Rosuvastatin calcium) .... Take 1 tablet by mouth every day 5)  Prozac 20 Mg Caps (Fluoxetine hcl) .... Take 1 capsule by mouth once a day 6)  Singulair 10 Mg Tabs (Montelukast sodium) .... Take 1 tablet by mouth once a day 7)  Zetia 10 Mg Tabs (Ezetimibe) .... Take 1 tablet by mouth once a day 8)  Proair Hfa 108 (90 Base) Mcg/act Aers (Albuterol sulfate) .... Tale 2 inhalations three times a day  when needed 9)  Spiriva Handihaler 18 Mcg Caps (Tiotropium bromide monohydrate) .... One puff at bedtime 10)  Vitamin D 70350 Unit Caps (Ergocalciferol) .Marland Kitchen.. 1 by mouth weekly    ]

## 2010-08-08 NOTE — Assessment & Plan Note (Signed)
Summary: still coughing,hoarsness,major fatigue,schedule OV per Dr Shelda Jakes...   Vital Signs:  Patient Profile:   65 Years Old Female Weight:      162 pounds O2 Sat:      98 % Temp:     97.5 degrees F Pulse rate:   107 / minute BP sitting:   146 / 100  (left arm)  Vitals Entered By: Pura Spice, RN (July 08, 2008 8:58 AM)                 Chief Complaint:  reck cough still cough finiished avelox on Monday .  History of Present Illness: repear BP 136/90 Pt had been onAvelox for bronchitis and has improved considerably but not completely, malaise and continues to cough and dyspneic on exertion continues to smoke and we have discussed this problem no other complaints has not lost weight as planned     Current Allergies (reviewed today): No known allergies     Risk Factors:     Counseled to quit/cut down tobacco use:  yes   Review of Systems      See HPI  General      Denies chills, fatigue, fever, loss of appetite, malaise, sleep disorder, sweats, weakness, and weight loss.  ENT      Denies decreased hearing, difficulty swallowing, ear discharge, earache, hoarseness, nasal congestion, nosebleeds, postnasal drainage, ringing in ears, sinus pressure, and sore throat.  CV      Denies bluish discoloration of lips or nails, chest pain or discomfort, difficulty breathing at night, difficulty breathing while lying down, fainting, fatigue, leg cramps with exertion, lightheadness, near fainting, palpitations, shortness of breath with exertion, swelling of feet, swelling of hands, and weight gain.  Resp      Complains of cough, shortness of breath, and sputum productive.   Physical Exam  General:     Well-developed,well-nourished,in no acute distress; alert,appropriate and cooperative throughout examinationoverweight-appearing.   Head:     Normocephalic and atraumatic without obvious abnormalities. No apparent alopecia or balding. Eyes:     No corneal or  conjunctival inflammation noted. EOMI. Perrla. Funduscopic exam benign, without hemorrhages, exudates or papilledema. Vision grossly normal. Ears:     External ear exam shows no significant lesions or deformities.  Otoscopic examination reveals clear canals, tympanic membranes are intact bilaterally without bulging, retraction, inflammation or discharge. Hearing is grossly normal bilaterally. Nose:     External nasal examination shows no deformity or inflammation. Nasal mucosa are pink and moist without lesions or exudates. Mouth:     Oral mucosa and oropharynx without lesions or exudates.  Teeth in good repair. Lungs:     rhonchi, exp wehhzes Heart:     Normal rate and regular rhythm. S1 and S2 normal without gallop, murmur, click, rub or other extra sounds.    Impression & Recommendations:  Problem # 1:  COPD (ICD-496) Assessment: Unchanged  Her updated medication list for this problem includes:    Singulair 10 Mg Tabs (Montelukast sodium) .Marland Kitchen... Take 1 tablet by mouth once a day    Proair Hfa 108 (90 Base) Mcg/act Aers (Albuterol sulfate) .Marland Kitchen... Tale 2 inhalations three times a day  when needed    Spiriva Handihaler 18 Mcg Caps (Tiotropium bromide monohydrate) ..... One puff at bedtime    Symbicort 160-4.5 Mcg/act Aero (Budesonide-formoterol fumarate) .Marland Kitchen... 1 inhalation bid  Orders: T-2 View CXR, Same Day (71020.5TC)   Problem # 2:  WHEEZING (ICD-786.07) Assessment: Unchanged  Problem # 3:  OVERWEIGHT (ICD-278.02)  Assessment: Unchanged  Problem # 4:  ASTHMATIC BRONCHITIS, ACUTE (ICD-466.0) Assessment: Unchanged  The following medications were removed from the medication list:    Zithromax Z-pak 250 Mg Tabs (Azithromycin) .Marland Kitchen... 2 stat then 1  qd  Her updated medication list for this problem includes:    Singulair 10 Mg Tabs (Montelukast sodium) .Marland Kitchen... Take 1 tablet by mouth once a day    Proair Hfa 108 (90 Base) Mcg/act Aers (Albuterol sulfate) .Marland Kitchen... Tale 2 inhalations three  times a day  when needed    Spiriva Handihaler 18 Mcg Caps (Tiotropium bromide monohydrate) ..... One puff at bedtime    Symbicort 160-4.5 Mcg/act Aero (Budesonide-formoterol fumarate) .Marland Kitchen... 1 inhalation bid    Hydrocodone-homatropine 5-1.5 Mg/36ml Syrp (Hydrocodone-homatropine) .Marland Kitchen... 2 tsp q4h as needed cough    Levaquin 750 Mg Tabs (Levofloxacin) .Marland Kitchen... 1 qd  Orders: T-2 View CXR, Same Day (71020.5TC) Depo- Medrol 80mg  (J1040) Admin of Therapeutic Inj  intramuscular or subcutaneous (16109)   Complete Medication List: 1)  Alprazolam 0.25 Mg Tabs (Alprazolam) .... Take 1 tablet by mouth three times a day as needed stress 2)  Aspirin 81 Mg Tbec (Aspirin) .... Take 1 tablet by mouth once a day 3)  Caltrate 600+d Plus 600-400 Mg-unit Tabs (Calcium carbonate-vit d-min) .... Take 1 tablet by mouth twice a day 4)  Crestor 10 Mg Tabs (Rosuvastatin calcium) .... Take 1 tablet by mouth every day 5)  Prozac 20 Mg Caps (Fluoxetine hcl) .... Take 1 capsule by mouth once a day 6)  Singulair 10 Mg Tabs (Montelukast sodium) .... Take 1 tablet by mouth once a day 7)  Zetia 10 Mg Tabs (Ezetimibe) .... Take 1 tablet by mouth once a day 8)  Proair Hfa 108 (90 Base) Mcg/act Aers (Albuterol sulfate) .... Tale 2 inhalations three times a day  when needed 9)  Spiriva Handihaler 18 Mcg Caps (Tiotropium bromide monohydrate) .... One puff at bedtime 10)  Vitamin D 60454 Unit Caps (Ergocalciferol) .Marland Kitchen.. 1 by mouth weekly 11)  Symbicort 160-4.5 Mcg/act Aero (Budesonide-formoterol fumarate) .Marland Kitchen.. 1 inhalation bid 12)  Hydrocodone-homatropine 5-1.5 Mg/72ml Syrp (Hydrocodone-homatropine) .... 2 tsp q4h as needed cough 13)  Levaquin 750 Mg Tabs (Levofloxacin) .Marland Kitchen.. 1 qd   Patient Instructions: 1)  asthmatic bronchitis and COPD 2)  Levaquin 750 mg for 7 days, also mucinex bid 3)  Depomedrol 120mg  IM  4)  continue symbicort, and proair as needed 5)  good fluid intake 6)  Tobacco is very bad for your health and your loved  ones! You Should stop smoking!. 7)  To refer to Pulmonologist if not improved   Prescriptions: HYDROCODONE-HOMATROPINE 5-1.5 MG/5ML SYRP (HYDROCODONE-HOMATROPINE) 2 tsp q4h as needed cough  #320 x 3   Entered and Authorized by:   Judithann Sheen MD   Signed by:   Judithann Sheen MD on 07/08/2008   Method used:   Print then Give to Patient   RxID:   [id number] LEVAQUIN 750 MG TABS (LEVOFLOXACIN) 1 qd  #7 x 0   Entered and Authorized by:   Judithann Sheen MD   Signed by:   Judithann Sheen MD on 07/08/2008   Method used:   Electronically to        [facility]* (retail)       [address]       [place], [place]  [id number]       Ph: [phone]       Fax: [phone]  RxID:   [id number]  ]  Medication Administration  Injection # 1:    Medication: Depo- Medrol 80mg     Diagnosis: ASTHMATIC BRONCHITIS, ACUTE (ICD-466.0)    Route: IM    Site: RUOQ gluteus    Exp Date: 08/2009    Lot #: [id number]    Mfr: sircor     Comments: 120 mg given     Patient tolerated injection without complications    Given by: Pura Spice, RN (July 08, 2008 10:00 AM)  Orders Added: 1)  T-2 View CXR, Same Day [71020.5TC] 2)  Depo- Medrol 80mg  [J1040] 3)  Admin of Therapeutic Inj  intramuscular or subcutaneous [96372] 4)  Est. Patient Level IV [21308]

## 2010-08-08 NOTE — Progress Notes (Signed)
Summary: new rx simvastatin   Phone Note From Pharmacy   Caller: [facility]* Summary of Call: wants to request another med to change from crestor to alternative med due to insuranc e purposes.  Initial call taken by: Pura Spice, RN,  January 11, 2010 2:37 PM  Follow-up for Phone Call        per dr staffrod  call in simvastatin.  Follow-up by: Pura Spice, RN,  January 11, 2010 2:37 PM    New/Updated Medications: SIMVASTATIN 40 MG TABS (SIMVASTATIN) 1 by mouth at hs for cholesterol Prescriptions: SIMVASTATIN 40 MG TABS (SIMVASTATIN) 1 by mouth at hs for cholesterol  #30 x 3   Entered by:   Pura Spice, RN   Authorized by:   Judithann Sheen MD   Signed by:   Pura Spice, RN on 01/11/2010   Method used:   Electronically to        [facility]* (retail)       [address]       [place], [place]  [id number]       Ph: [phone]       Fax: [phone]   RxID:   [id number]   Appended Document: new rx simvastatin  left mess for pt to call regarding change in med and was sent to [facility]. .gh.Theola Sequin..Marland Kitchen

## 2010-08-08 NOTE — Progress Notes (Signed)
Summary: refill alprazolam   Phone Note From Pharmacy   Caller: [facility]* Reason for Call: Needs renewal Summary of Call: refill alprazolam  Initial call taken by: Pura Spice, RN,  January 05, 2009 2:13 PM  Follow-up for Phone Call        refill alprazolam faxed to [facility] Follow-up by: Pura Spice, RN,  January 05, 2009 2:14 PM    New/Updated Medications: ALPRAZOLAM 0.25 MG TABS (ALPRAZOLAM) Take 1 tablet by mouth three times a day as needed stress   Prescriptions: ALPRAZOLAM 0.25 MG TABS (ALPRAZOLAM) Take 1 tablet by mouth three times a day as needed stress  #90 x 5   Entered by:   Pura Spice, RN   Authorized by:   Judithann Sheen MD   Signed by:   Pura Spice, RN on 01/05/2009   Method used:   Telephoned to ...       [facility]* (retail)       [address]       [place], [place]  [address]       Ph: [phone]       Fax: [phone]   RxID:   [id number]

## 2010-08-08 NOTE — Letter (Signed)
Summary: Patient Notice- Polyp Results  [facility]  [address] [place], [place] [address]   Phone: [phone]  Fax: [phone]        March 02, 2008 MRN: [id number]    [facility] [address] [place], [place]  [address]    Dear Ms. Knappenberger,  I am pleased to inform you that the colon polyp(s) removed during your recent colonoscopy was (were) found to be benign (no cancer detected) upon pathologic examination.  I recommend you have a repeat colonoscopy examination in 5 years to look for recurrent polyps, as having colon polyps increases your risk for having recurrent polyps or even colon cancer in the future.  Should you develop new or worsening symptoms of abdominal pain, bowel habit changes or bleeding from the rectum or bowels, please schedule an evaluation with either your primary care physician or with me.  Continue treatment plan as outlined the day of your exam.  Please call us if you are having persistent problems or have questions about your condition that have not been fully answered at this time.  Sincerely,  Meryl Dare MD [facility]  This letter has been electronically signed by your physician.

## 2010-08-08 NOTE — Progress Notes (Signed)
Summary: Rx Refill: Simvastatin & Hydrocodone  Phone Note Refill Request Message from:  Fax from Pharmacy on June 05, 2010 2:21 PM  Refills Requested: Medication #1:  HYDROCODONE-ACETAMINOPHEN 10-650 MG TABS 1 q4-6 as needed pain not to exceed 4 per day   Supply Requested: 1 month   Last Refilled: 05/05/2010  Medication #2:  SIMVASTATIN 40 MG TAB TAKE 1 TABLET AT BEDTIME FOR CHOLESTEROL   Supply Requested: 1 month   Last Refilled: 05/05/2010 Initial call taken by: Trixie Dredge,  June 05, 2010 2:23 PM    New/Updated Medications: ZOCOR 40 MG TABS (SIMVASTATIN) qd Prescriptions: ZOCOR 40 MG TABS (SIMVASTATIN) qd  #30 x 0   Entered by:   Duard Brady LPN   Authorized by:   Evelena Peat MD   Signed by:   Duard Brady LPN on 16/04/9603   Method used:   Historical   RxID:   [id number] HYDROCODONE-ACETAMINOPHEN 10-650 MG TABS (HYDROCODONE-ACETAMINOPHEN) 1 q4-6 as needed pain not to exceed 4 per day  #120 x 0   Entered by:   Duard Brady LPN   Authorized by:   Evelena Peat MD   Signed by:   Duard Brady LPN on 21/30/8657   Method used:   Historical   RxID:   [id number]  per pharmacy - zorcor rx'd since [id number].  medication list changed and refill given KIK

## 2010-08-10 NOTE — Assessment & Plan Note (Signed)
Summary: cpx/cjr/pt rescd from bump//ccm/pt rsc from bmp/cjr   Vital Signs:  Patient profile:   65 year old female Menstrual status:  postmenopausal Height:      62.5 inches Weight:      142 pounds BMI:     25.65 Temp:     98.2 degrees F oral Pulse rate:   80 / minute Pulse rhythm:   regular Resp:     14 per minute BP sitting:   130 / 80  (left arm)  Vitals Entered By: Willy Eddy, LPN (June 20, 2010 3:18 PM) CC: annual visit for disease management Is Patient Diabetic? No   History of Present Illness: This 65 year old white divorced female is in the discussed her medical problem he complained of some cough with greenish sputum he continues to smoke at least one pack per day. She has lost weight due to stopping her alcoholic intake and watching her calories. 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 Take 1 Tablet By Mouth Three Times A Day As Needed Stress 2)  Caltrate 600+d Plus 600-400 Mg-Unit Tabs (Calcium Carbonate-Vit D-Min) .... Take 1 Tablet By Mouth Twice A Day 3)  Prozac 20 Mg Caps (Fluoxetine Hcl) .... Take 1 Capsule By Mouth Once A Day 4)   Singulair 10 Mg Tabs (Montelukast Sodium) .... Take 1 Tablet By Mouth Once A Day 5)  Zetia 10 Mg Tabs (Ezetimibe) .... Take 1 Tablet By Mouth Once A Day 6)  Proair Hfa 108 (90 Base) Mcg/act  Aers (Albuterol Sulfate) .... Tale 2 Inhalations Three Times A Day  When Needed 7)  Spiriva Handihaler 18 Mcg  Caps (Tiotropium Bromide Monohydrate) .... One Puff At Bedtime 8)  Vitamin D 47425 Unit  Caps (Ergocalciferol) .Marland Kitchen.. 1 By Mouth Weekly 9)  Symbicort 160-4.5 Mcg/act Aero (Budesonide-Formoterol Fumarate) .... 2 Inhalations Bid 10)  Hydrocodone-Homatropine 5-1.5 Mg/45ml Syrp (Hydrocodone-Homatropine) .... 2 Tsp Q4h As Needed Cough 11)  Hydrocodone-Acetaminophen 10-650 Mg Tabs (Hydrocodone-Acetaminophen) .Marland Kitchen.. 1 Q4-6 As Needed Pain Not To Exceed 4 Per Day 12)  Zocor 40 Mg Tabs (Simvastatin) .... Qd  Allergies (verified): No Known Drug Allergies  Past History:  Past Surgical History: Last updated: 12/31/2006 Tonsillectomy  Risk Factors: Smoking Status: current (06/20/2010) Packs/Day: 1.0 (06/20/2010)  Past Medical History: Allergic rhinitis Hyperlipidemia COPD mild  Physical Exam  General:  Well-developed,well-nourished,in no acute distress; alert,appropriate and cooperative throughout examinationoverweight-appearing.   Head:  Normocephalic and atraumatic without obvious abnormalities. No apparent alopecia or balding. Eyes:  No corneal or conjunctival inflammation noted. EOMI. Perrla. Funduscopic exam benign, without hemorrhages, exudates or papilledema. Vision grossly normal. Ears:  External ear exam shows no significant lesions or deformities.  Otoscopic examination reveals clear canals, tympanic membranes are intact bilaterally without bulging, retraction, inflammation or discharge. Hearing is grossly normal bilaterally. Nose:  External nasal examination shows no deformity or inflammation. Nasal mucosa are pink and moist without lesions or exudates. Mouth:  Oral mucosa and oropharynx  without lesions or exudates.  Teeth in good repair. Neck:  No deformities, masses, or tenderness noted. Chest Wall:  No deformities, masses, or tenderness noted. Breasts:  No mass, nodules, thickening, tenderness, bulging, retraction, inflamation, nipple discharge or skin changes noted.   Lungs:  rhonchi with decreased breath sounds as well as minimal expiratory wheeze occasional rale Heart:  Normal rate and regular rhythm. S1 and S2 normal without gallop, murmur, click, rub or other extra sounds. Abdomen:  Bowel sounds positive,abdomen soft and non-tender without masses, organomegaly or hernias noted. Rectal:  not examined Genitalia:  not examined Pap 02/25/2009 neg  Msk:  No deformity or scoliosis noted of thoracic or lumbar spine.   Pulses:  R and L carotid,radial,femoral,dorsalis pedis and posterior tibial pulses are full and equal bilaterally Extremities:  No clubbing, cyanosis, edema, or deformity noted with normal full range of motion of all joints.   Neurologic:  No cranial nerve deficits noted. Station and gait are normal. Plantar reflexes are down-going bilaterally. DTRs are symmetrical throughout. Sensory, motor and coordinative functions appear intact. Skin:  Intact without suspicious lesions or rashes Cervical Nodes:  No lymphadenopathy noted Axillary Nodes:  No palpable lymphadenopathy Inguinal Nodes:  No significant adenopathy Psych:  Cognition and judgment appear intact. Alert and cooperative with normal attention span and concentration. No apparent delusions, illusions, hallucinations   Impression & Recommendations:  Problem # 1:  DEGENERATIVE JOINT DISEASE, CERVICAL SPINE (ICD-721.90) Assessment Deteriorated prednisone decreasing dose  Problem # 2:  ANXIETY DEPRESSION (ICD-300.4) Assessment: Improved  Problem # 3:  COPD (ICD-496) Assessment: Unchanged  Her updated medication list for this problem includes:    Singulair 10 Mg Tabs (Montelukast sodium) .Marland Kitchen... Take 1  tablet by mouth once a day    Proair Hfa 108 (90 Base) Mcg/act Aers (Albuterol sulfate) .Marland Kitchen... Tale 2 inhalations three times a day  when needed    Spiriva Handihaler 18 Mcg Caps (Tiotropium bromide monohydrate) ..... One puff at bedtime    Symbicort 160-4.5 Mcg/act Aero (Budesonide-formoterol fumarate) .Marland Kitchen... 2 inhalations bid  Problem # 4:  OVERWEIGHT (ICD-278.02) Assessment: Improved decrease dweight 8 pounds  Problem # 5:  WHEEZING (ICD-786.07) Assessment: Unchanged  Problem # 6:  HYPERLIPIDEMIA (ICD-272.4) Assessment: Improved  Her updated medication list for this problem includes:    Zetia 10 Mg Tabs (Ezetimibe) .Marland Kitchen... Take 1 tablet by mouth once a day    Zocor 40 Mg Tabs (Simvastatin) ..... Qd  Problem # 7:  ALLERGIC RHINITIS (ICD-477.9) Assessment: Unchanged  Complete Medication List: 1)  Alprazolam 0.25 Mg Tabs (Alprazolam) .... Take 1 tablet by mouth three times a day as needed stress 2)  Caltrate 600+d Plus 600-400 Mg-unit Tabs (Calcium carbonate-vit d-min) .... Take 1 tablet by mouth twice a day 3)  Prozac 20 Mg Caps (Fluoxetine hcl) .... Take 1 capsule by mouth once a day 4)  Singulair 10 Mg Tabs (Montelukast sodium) .... Take 1 tablet by mouth once a day 5)  Zetia 10 Mg Tabs (Ezetimibe) .... Take 1 tablet by mouth once a day 6)  Proair Hfa 108 (90 Base) Mcg/act Aers (Albuterol sulfate) .... Tale 2 inhalations three times a day  when needed 7)  Spiriva Handihaler 18 Mcg Caps (Tiotropium bromide  monohydrate) .... One puff at bedtime 8)  Vitamin D 40981 Unit Caps (Ergocalciferol) .Marland Kitchen.. 1 by mouth weekly 9)  Symbicort 160-4.5 Mcg/act Aero (Budesonide-formoterol fumarate) .... 2 inhalations bid 10)  Hydrocodone-homatropine 5-1.5 Mg/49ml Syrp (Hydrocodone-homatropine) .... 2 tsp q4h as needed cough 11)  Hydrocodone-acetaminophen 10-650 Mg Tabs (Hydrocodone-acetaminophen) .Marland Kitchen.. 1 q4-6 as needed pain not to exceed 4 per day 12)  Zocor 40 Mg Tabs (Simvastatin) .... Qd 13)   Prednisone 10 Mg Tabs (Prednisone) .... 2 tidpc for 1 day then 1 three times day for 3 days then1 once daily for 6 days then 1 qd 14)  Phenteramine 37.5  .Marland Kitchen.. 1 qam to decrease appetite  Other Orders: UA Dipstick w/o Micro (manual) (19147)  Patient Instructions: 1)  Continue to diet , pleased that you havelost 8 lbs 2)  You need to lose weight. Consider a lower calorie diet and regular exercise.  3)  Tobacco is very bad for your health and your loved ones ! You should stop smoking !  4)  Stop smoking tips: Choose a quit date. Cut down before the quit date. Decide what you will do as a substitute when you feel the urge to smoke(gum, toothpick, exercise).  5)  refilled medications Prescriptions: HYDROCODONE-ACETAMINOPHEN 10-650 MG TABS (HYDROCODONE-ACETAMINOPHEN) 1 q4-6 as needed pain not to exceed 4 per day  #100 x 5   Entered and Authorized by:   Judithann Sheen MD   Signed by:   Judithann Sheen MD on 06/20/2010   Method used:   Print then Give to Patient   RxID:   [id number] PHENTERAMINE 37.5 1 qam to decrease appetite  #30 x 3   Entered and Authorized by:   Judithann Sheen MD   Signed by:   Judithann Sheen MD on 06/20/2010   Method used:   Print then Give to Patient   RxID:   [id number] HYDROCODONE-HOMATROPINE 5-1.5 MG/5ML SYRP (HYDROCODONE-HOMATROPINE) 2 tsp q4h as needed cough  #240cc x 4   Entered and Authorized by:   Judithann Sheen MD   Signed by:   Judithann Sheen MD on 06/20/2010   Method used:   Print then Give to Patient   RxID:   [id number] PREDNISONE 10 MG TABS (PREDNISONE) 2 tidpc for 1 day then 1 three times day for 3 days then1 once daily for 6 days then 1 qd  #36 x 1   Entered and Authorized by:   Judithann Sheen MD   Signed by:   Judithann Sheen MD on 06/20/2010   Method used:   Electronically to        [facility]* (retail)       [address]       [place], [place]  [id number]       Ph:  [phone]       Fax: [phone]   RxID:   [id number] PREDNISONE 10 MG TABS (PREDNISONE) 2 tidpc for 1 day then 1 three times day for 3 days then1 once daily for 6 days then 1 qd  #36 x 1   Entered and Authorized by:   Judithann Sheen MD   Signed by:   Judithann Sheen MD on 06/20/2010   Method used:   Print then Give to Patient   RxID:   [id number]    Orders Added: 1)  UA Dipstick w/o Micro (manual) [81002] 2)  Est. Patient Level IV [  78295]     Preventive Care Screening  Mammogram:    Date:  09/05/2009    Next Due:  09/2010    Results:  normal   Pap Smear:    Date:  04/11/2009    Next Due:  04/2012    Results:  normal    Laboratory Results   Urine Tests    Routine Urinalysis   Color: yellow Appearance: Clear Glucose: negative   (Normal Range: Negative) Bilirubin: 1+   (Normal Range: Negative) Ketone: trace (5)   (Normal Range: Negative) Spec. Gravity: 1.020   (Normal Range: 1.003-1.035) Blood: negative   (Normal Range: Negative) pH: 5.5   (Normal Range: 5.0-8.0) Protein: trace   (Normal Range: Negative) Urobilinogen: 0.2   (Normal Range: 0-1) Nitrite: negative   (Normal Range: Negative) Leukocyte Esterace: negative   (Normal Range: Negative)    Comments: Rita Ohara  June 20, 2010 4:36 PM

## 2010-08-10 NOTE — Progress Notes (Signed)
Summary: Alprazolam refill  Phone Note Refill Request Message from:  Fax from Pharmacy on July 11, 2010 3:30 PM  Refills Requested: Medication #1:  ALPRAZOLAM 0.25 MG TABS Take 1 tablet by mouth three times a day as needed stress   Dosage confirmed as above?Dosage Confirmed   Brand Name Necessary? No   Supply Requested: 1 month   Last Refilled: 05/30/2010  Method Requested: Electronic Initial call taken by: Lannette Donath,  July 11, 2010 3:30 PM    Prescriptions: ALPRAZOLAM 0.25 MG TABS (ALPRAZOLAM) Take 1 tablet by mouth three times a day as needed stress  #90 x 5   Entered and Authorized by:   Kern Reap CMA (AAMA)   Signed by:   Kern Reap CMA (AAMA) on 07/13/2010   Method used:   Telephoned to ...       [facility]* (retail)       [address]       [place], [place]  [address]       Ph: [phone]       Fax: [phone]   RxID:   [id number]

## 2010-09-12 ENCOUNTER — Ambulatory Visit: Payer: Self-pay | Admitting: Family Medicine

## 2010-09-12 ENCOUNTER — Ambulatory Visit (INDEPENDENT_AMBULATORY_CARE_PROVIDER_SITE_OTHER): Payer: PRIVATE HEALTH INSURANCE | Admitting: Family Medicine

## 2010-09-12 ENCOUNTER — Encounter: Payer: Self-pay | Admitting: Family Medicine

## 2010-09-12 VITALS — BP 100/60 | HR 103 | Temp 98.4°F | Ht 62.5 in | Wt 133.0 lb

## 2010-09-12 DIAGNOSIS — F32A Depression, unspecified: Secondary | ICD-10-CM

## 2010-09-12 DIAGNOSIS — F329 Major depressive disorder, single episode, unspecified: Secondary | ICD-10-CM

## 2010-09-12 DIAGNOSIS — J449 Chronic obstructive pulmonary disease, unspecified: Secondary | ICD-10-CM

## 2010-09-12 MED ORDER — CITALOPRAM HYDROBROMIDE 20 MG PO TABS: 20.0000 mg | ORAL_TABLET | Freq: Every day | ORAL | Status: DC

## 2010-09-12 NOTE — Patient Instructions (Addendum)
Contine to take  prozac 20 mg each day and add celex(citaloprm) for depression Call me in 4 weeks and we will discuss your medications You will probably see achange within aweek Restart  Using inhaler for your wheezing

## 2010-09-20 NOTE — Progress Notes (Signed)
  Subjective:    Patient ID: Donald Pore, female    DOB: [birth year], 65 y.o.   MRN: [id number] This 65 year old white divorced female who has lost weight from 147 133 and physical he is doing well even her COPD is improved especially since she is markedly decreased and smoking her main problem which we discussed at some time as she been depressed over the past 3 months and has been on Prozac but is not helping. Her depression has been increasing in severity we discussed her life situation unable but unable to pinpoint any one call. Possibly postmenopausal syndrome symptoms as stated COPD has been doing fine continues to have some arthritic pain but not severeHPI    Review of Systemsreview of systems negative with a stated above     Objective:   Physical Exam the patient appears to be in no distress but does appear slightly depressed there examination of the chest reveal minimal expiratory wheezes on expiration      Assessment & Plan:  Did not pain and the patient is depressed and plan of treatment is to continue Prozac 20 mg a day plus adding Celexa 20 mg per day,,plan to continue all the medications and call or see me in 4-6 weeks if she is not improved as far as the depression

## 2010-11-15 ENCOUNTER — Other Ambulatory Visit: Payer: Self-pay

## 2010-11-15 MED ORDER — HYDROCODONE-ACETAMINOPHEN 10-650 MG PO TABS: 1.0000 | ORAL_TABLET | Freq: Four times a day (QID) | ORAL | Status: DC | PRN

## 2010-11-15 NOTE — Telephone Encounter (Signed)
rx phoned in to [facility] for hydrocodone-acte 10-650 #100 with 5 ok per Dr. Scotty Court

## 2010-12-31 ENCOUNTER — Other Ambulatory Visit: Payer: Self-pay | Admitting: Family Medicine

## 2011-02-01 ENCOUNTER — Other Ambulatory Visit: Payer: Self-pay

## 2011-02-01 MED ORDER — ALPRAZOLAM 0.25 MG PO TABS: 0.2500 mg | ORAL_TABLET | Freq: Three times a day (TID) | ORAL | Status: DC | PRN

## 2011-02-01 NOTE — Telephone Encounter (Signed)
rx for alprazolam sent to [facility].

## 2011-02-20 ENCOUNTER — Ambulatory Visit (INDEPENDENT_AMBULATORY_CARE_PROVIDER_SITE_OTHER): Payer: Medicare Other | Admitting: Family Medicine

## 2011-02-20 ENCOUNTER — Encounter: Payer: Self-pay | Admitting: Family Medicine

## 2011-02-20 VITALS — BP 128/78 | HR 99 | Temp 98.6°F | Wt 145.0 lb

## 2011-02-20 DIAGNOSIS — F32A Depression, unspecified: Secondary | ICD-10-CM

## 2011-02-20 DIAGNOSIS — R05 Cough: Secondary | ICD-10-CM

## 2011-02-20 DIAGNOSIS — J449 Chronic obstructive pulmonary disease, unspecified: Secondary | ICD-10-CM

## 2011-02-20 DIAGNOSIS — R059 Cough, unspecified: Secondary | ICD-10-CM

## 2011-02-20 DIAGNOSIS — F329 Major depressive disorder, single episode, unspecified: Secondary | ICD-10-CM

## 2011-02-20 MED ORDER — HYDROCODONE-HOMATROPINE 5-1.5 MG/5ML PO SYRP: ORAL_SOLUTION | ORAL | Status: DC

## 2011-02-20 MED ORDER — FLUOXETINE HCL 40 MG PO CAPS: 40.0000 mg | ORAL_CAPSULE | Freq: Every day | ORAL | Status: DC

## 2011-02-20 MED ORDER — TRAMADOL HCL 50 MG PO TABS: ORAL_TABLET | ORAL | Status: DC

## 2011-02-20 NOTE — Progress Notes (Signed)
  Subjective:    Patient ID: Andrea Larson, female    DOB: August 11, 1945, 65 y.o.   MRN: [id number] This 65 year old white divorced female was treated by another physician 10 days ago for bronchitis treated with Avelox and he improved however continues to still cough and came in for follow up examination regarding necessity  of further treatment.. she was also treated with prednisone and over this period of time as gaining weight from taking the prednisone. She has no other complaint other than the cough, no fever nonproductive cough Discuss the fact that her emotional state is much better since increasing Prozac to 40 mg per day and stopped in Celexa which we will continue HPI    Review of Systems see history of present illness     Objective:   Physical Exam the patient is a well-developed well-nourished slightly overweight white female in no distress pleasant cooperative. HEENT no positive finding postnasal drainage Lungs decreased breath sounds however on deep inspiration and expiration has minimal wheezing bilaterally no rales no dullness        Assessment & Plan:  Bronchitis completed treatment problem consists of COPD to continue Spiriva proair, Symbicort and Singulair Cough 2 treat with tramadol 50 mg 4 times a day, ordered chest x-ray Anxiety depression increased Prozac 40 mg daily continue alprazolam as needed

## 2011-02-20 NOTE — Patient Instructions (Addendum)
You have COPD and recurrent bronchitis I do not feel that she needs an antibiotic this time continue the other supportive treatment to prevent recurrence take Spiriva  Symbicort Proair Singulair And most of allr your cessation of smoking is going to help tremendously sTop Celexa and will start Prozact 40 mg a day

## 2011-02-21 ENCOUNTER — Ambulatory Visit (INDEPENDENT_AMBULATORY_CARE_PROVIDER_SITE_OTHER)
Admission: RE | Admit: 2011-02-21 | Discharge: 2011-02-21 | Disposition: A | Discharge status: Home / Self Care | Payer: PRIVATE HEALTH INSURANCE | Source: Ambulatory Visit | Attending: Family Medicine | Admitting: Family Medicine

## 2011-02-21 DIAGNOSIS — R05 Cough: Secondary | ICD-10-CM

## 2011-02-21 DIAGNOSIS — R059 Cough, unspecified: Secondary | ICD-10-CM

## 2011-02-26 NOTE — Progress Notes (Signed)
Quick Note:  Pt aware ______ 

## 2011-03-15 ENCOUNTER — Other Ambulatory Visit: Payer: Self-pay | Admitting: Family Medicine

## 2011-04-09 ENCOUNTER — Ambulatory Visit: Payer: PRIVATE HEALTH INSURANCE

## 2011-04-09 ENCOUNTER — Other Ambulatory Visit: Payer: Self-pay | Admitting: Family Medicine

## 2011-04-10 ENCOUNTER — Other Ambulatory Visit: Payer: Self-pay | Admitting: Family Medicine

## 2011-04-11 ENCOUNTER — Ambulatory Visit (INDEPENDENT_AMBULATORY_CARE_PROVIDER_SITE_OTHER): Payer: PRIVATE HEALTH INSURANCE

## 2011-04-11 DIAGNOSIS — Z23 Encounter for immunization: Secondary | ICD-10-CM

## 2011-05-16 LAB — HM MAMMOGRAPHY: HM Mammogram: NEGATIVE

## 2011-05-25 ENCOUNTER — Other Ambulatory Visit: Payer: Self-pay

## 2011-05-25 MED ORDER — ALBUTEROL SULFATE HFA 108 (90 BASE) MCG/ACT IN AERS: 2.0000 | INHALATION_SPRAY | Freq: Four times a day (QID) | RESPIRATORY_TRACT | Status: DC | PRN

## 2011-05-25 NOTE — Telephone Encounter (Signed)
rx sent in to pharmacy for albuterol.

## 2011-06-15 ENCOUNTER — Other Ambulatory Visit: Payer: Self-pay | Admitting: Family Medicine

## 2011-06-18 NOTE — Telephone Encounter (Signed)
Pt aware of need to establish with another physician and that there will be no further refills authorized

## 2011-06-26 ENCOUNTER — Encounter: Payer: Self-pay | Admitting: Family Medicine

## 2011-06-27 ENCOUNTER — Encounter: Payer: PRIVATE HEALTH INSURANCE | Admitting: Family Medicine

## 2011-08-20 ENCOUNTER — Ambulatory Visit (INDEPENDENT_AMBULATORY_CARE_PROVIDER_SITE_OTHER): Payer: Medicare Other | Admitting: Family Medicine

## 2011-08-20 VITALS — BP 135/88 | HR 88 | Temp 98.7°F | Resp 20 | Ht 62.5 in | Wt 142.4 lb

## 2011-08-20 DIAGNOSIS — J069 Acute upper respiratory infection, unspecified: Secondary | ICD-10-CM

## 2011-08-20 DIAGNOSIS — J449 Chronic obstructive pulmonary disease, unspecified: Secondary | ICD-10-CM

## 2011-08-20 DIAGNOSIS — E7800 Pure hypercholesterolemia, unspecified: Secondary | ICD-10-CM

## 2011-08-20 MED ORDER — HYDROCODONE-HOMATROPINE 5-1.5 MG/5ML PO SYRP: ORAL_SOLUTION | ORAL | Status: DC

## 2011-08-20 MED ORDER — AZITHROMYCIN 250 MG PO TABS: ORAL_TABLET | ORAL | Status: AC

## 2011-08-20 MED ORDER — SIMVASTATIN 40 MG PO TABS: 40.0000 mg | ORAL_TABLET | Freq: Every day | ORAL | Status: DC

## 2011-08-20 MED ORDER — TIOTROPIUM BROMIDE MONOHYDRATE 18 MCG IN CAPS: 18.0000 ug | ORAL_CAPSULE | Freq: Every day | RESPIRATORY_TRACT | Status: DC

## 2011-08-20 MED ORDER — EZETIMIBE 10 MG PO TABS: 10.0000 mg | ORAL_TABLET | Freq: Every day | ORAL | Status: DC

## 2011-08-20 NOTE — Progress Notes (Signed)
  Subjective:    Patient ID: Andrea Larson, female    DOB: [birth year], 66 y.o.   MRN: [id number]  HPI 66 yo female with COPD/Emphysema and several other medical problems here with URI symptoms for over a week.  Coughing up yellow phlegm.  Sore throat.  Left ear fullness/pressure.  No fever.  No runny nose.  No shortness of breath currently.  Some increased used of albuterol since illness.  Quit smoking in August 2012.  No recent use of antibiotics.    Also, needs refill of cholesterol meds and spiriva.  PCP appt isn't until mid-March.  Not fasting today.    Review of Systems Negative except as per HPI     Objective:   Physical Exam  Constitutional: She appears well-developed. No distress.  HENT:  Right Ear: Tympanic membrane, external ear and ear canal normal. Tympanic membrane is not injected, not scarred, not perforated, not erythematous, not retracted and not bulging.  Left Ear: Tympanic membrane, external ear and ear canal normal. Tympanic membrane is not injected, not scarred, not perforated, not erythematous, not retracted and not bulging.  Nose: No mucosal edema or rhinorrhea. Right sinus exhibits no maxillary sinus tenderness and no frontal sinus tenderness. Left sinus exhibits no maxillary sinus tenderness and no frontal sinus tenderness.  Mouth/Throat: Uvula is midline, oropharynx is clear and moist and mucous membranes are normal. No oropharyngeal exudate or tonsillar abscesses.  Cardiovascular: Normal rate, regular rhythm, normal heart sounds and intact distal pulses.   No murmur heard. Pulmonary/Chest: Effort normal and breath sounds normal. No respiratory distress. She has no wheezes. She has no rales.  Lymphadenopathy:       Head (right side): No submandibular and no preauricular adenopathy present.       Head (left side): No submandibular and no preauricular adenopathy present.       Right cervical: No superficial cervical and no posterior cervical adenopathy present.     Left cervical: No superficial cervical and no posterior cervical adenopathy present.       Right: No supraclavicular adenopathy present.       Left: No supraclavicular adenopathy present.  Skin: Skin is warm and dry.          Assessment & Plan:  URI  For 7-10 days- in patient with COPD.  Zpak.  Hycodan.  No wheezing and good O2 sat.  No prednisone right now.  Consider if does not improve.  High cholesterol - refilled simvastatin and zetia for 2 months.  cOPD - refilled spiriva for 2 months.

## 2011-09-03 ENCOUNTER — Other Ambulatory Visit: Payer: Self-pay | Admitting: Family Medicine

## 2011-09-04 ENCOUNTER — Other Ambulatory Visit: Payer: Self-pay

## 2011-09-04 ENCOUNTER — Other Ambulatory Visit: Payer: Self-pay | Admitting: Family Medicine

## 2011-09-04 MED ORDER — HYDROCODONE-ACETAMINOPHEN 10-650 MG PO TABS: ORAL_TABLET | ORAL | Status: DC

## 2011-09-04 MED ORDER — ALPRAZOLAM 0.25 MG PO TABS: 0.2500 mg | ORAL_TABLET | Freq: Three times a day (TID) | ORAL | Status: DC | PRN

## 2011-09-04 NOTE — Telephone Encounter (Signed)
Ok per Dr. Caryl Never to fill alprazolam 0.25 mg and hydrocodone-acetaminophen 10-650 until pt's appt on 09/21/11.  Rx called in to pharmacy.

## 2011-09-15 ENCOUNTER — Encounter: Payer: Self-pay | Admitting: Internal Medicine

## 2011-09-15 DIAGNOSIS — Z0001 Encounter for general adult medical examination with abnormal findings: Secondary | ICD-10-CM | POA: Insufficient documentation

## 2011-09-15 DIAGNOSIS — Z0000 Encounter for general adult medical examination without abnormal findings: Secondary | ICD-10-CM | POA: Insufficient documentation

## 2011-09-21 ENCOUNTER — Other Ambulatory Visit (INDEPENDENT_AMBULATORY_CARE_PROVIDER_SITE_OTHER): Payer: PRIVATE HEALTH INSURANCE

## 2011-09-21 ENCOUNTER — Encounter: Payer: Self-pay | Admitting: Internal Medicine

## 2011-09-21 ENCOUNTER — Ambulatory Visit (INDEPENDENT_AMBULATORY_CARE_PROVIDER_SITE_OTHER): Payer: Medicare Other | Admitting: Internal Medicine

## 2011-09-21 DIAGNOSIS — J45909 Unspecified asthma, uncomplicated: Secondary | ICD-10-CM | POA: Insufficient documentation

## 2011-09-21 DIAGNOSIS — R5381 Other malaise: Secondary | ICD-10-CM

## 2011-09-21 DIAGNOSIS — K635 Polyp of colon: Secondary | ICD-10-CM | POA: Insufficient documentation

## 2011-09-21 DIAGNOSIS — J309 Allergic rhinitis, unspecified: Secondary | ICD-10-CM | POA: Insufficient documentation

## 2011-09-21 DIAGNOSIS — R5383 Other fatigue: Secondary | ICD-10-CM | POA: Insufficient documentation

## 2011-09-21 DIAGNOSIS — E785 Hyperlipidemia, unspecified: Secondary | ICD-10-CM

## 2011-09-21 HISTORY — DX: Allergic rhinitis, unspecified: J30.9

## 2011-09-21 LAB — HEPATIC FUNCTION PANEL
ALT: 39 U/L — ABNORMAL HIGH (ref 0–35)
AST: 33 U/L (ref 0–37)
Albumin: 4.2 g/dL (ref 3.5–5.2)
Alkaline Phosphatase: 63 U/L (ref 39–117)
Bilirubin, Direct: 0.1 mg/dL (ref 0.0–0.3)
Total Bilirubin: 0.7 mg/dL (ref 0.3–1.2)
Total Protein: 7.9 g/dL (ref 6.0–8.3)

## 2011-09-21 LAB — BASIC METABOLIC PANEL
BUN: 24 mg/dL — ABNORMAL HIGH (ref 6–23)
CO2: 26 mEq/L (ref 19–32)
Calcium: 9.5 mg/dL (ref 8.4–10.5)
Chloride: 103 mEq/L (ref 96–112)
Creatinine, Ser: 1 mg/dL (ref 0.4–1.2)
GFR: 58.59 mL/min — ABNORMAL LOW (ref >60.00)
Glucose, Bld: 116 mg/dL — ABNORMAL HIGH (ref 70–99)
Potassium: 4.6 mEq/L (ref 3.5–5.1)
Sodium: 136 mEq/L (ref 135–145)

## 2011-09-21 LAB — CBC WITH DIFFERENTIAL/PLATELET
Basophils Absolute: 0 10*3/uL (ref 0.0–0.1)
Basophils Relative: 0.4 % (ref 0.0–3.0)
Eosinophils Absolute: 0.1 10*3/uL (ref 0.0–0.7)
Eosinophils Relative: 1.1 % (ref 0.0–5.0)
HCT: 43.8 % (ref 36.0–46.0)
Hemoglobin: 14.4 g/dL (ref 12.0–15.0)
Lymphocytes Relative: 15.3 % (ref 12.0–46.0)
Lymphs Abs: 1.6 10*3/uL (ref 0.7–4.0)
MCHC: 33 g/dL (ref 30.0–36.0)
MCV: 97.2 fl (ref 78.0–100.0)
Monocytes Absolute: 0.6 10*3/uL (ref 0.1–1.0)
Monocytes Relative: 5.4 % (ref 3.0–12.0)
Neutro Abs: 8.2 10*3/uL — ABNORMAL HIGH (ref 1.4–7.7)
Neutrophils Relative %: 77.8 % — ABNORMAL HIGH (ref 43.0–77.0)
Platelets: 418 10*3/uL — ABNORMAL HIGH (ref 150.0–400.0)
RBC: 4.51 Mil/uL (ref 3.87–5.11)
RDW: 12.9 % (ref 11.5–14.6)
WBC: 10.6 10*3/uL — ABNORMAL HIGH (ref 4.5–10.5)

## 2011-09-21 LAB — URINALYSIS, ROUTINE W REFLEX MICROSCOPIC
Bilirubin Urine: NEGATIVE
Ketones, ur: NEGATIVE
Leukocytes, UA: NEGATIVE
Nitrite: POSITIVE
Specific Gravity, Urine: 1.025 (ref 1.000–1.030)
Total Protein, Urine: NEGATIVE
Urine Glucose: NEGATIVE
Urobilinogen, UA: 0.2 (ref 0.0–1.0)
pH: 5.5 (ref 5.0–8.0)

## 2011-09-21 LAB — LIPID PANEL
Cholesterol: 210 mg/dL — ABNORMAL HIGH (ref 0–200)
HDL: 68.2 mg/dL (ref >39.00)
Total CHOL/HDL Ratio: 3
Triglycerides: 163 mg/dL — ABNORMAL HIGH (ref 0.0–149.0)
VLDL: 32.6 mg/dL (ref 0.0–40.0)

## 2011-09-21 LAB — TSH: TSH: 1.02 u[IU]/mL (ref 0.35–5.50)

## 2011-09-21 LAB — LDL CHOLESTEROL, DIRECT: Direct LDL: 124.8 mg/dL

## 2011-09-21 MED ORDER — TRAMADOL HCL 50 MG PO TABS: ORAL_TABLET | ORAL | Status: DC

## 2011-09-21 MED ORDER — EZETIMIBE 10 MG PO TABS: 10.0000 mg | ORAL_TABLET | Freq: Every day | ORAL | Status: DC

## 2011-09-21 MED ORDER — ALBUTEROL SULFATE HFA 108 (90 BASE) MCG/ACT IN AERS: 2.0000 | INHALATION_SPRAY | Freq: Four times a day (QID) | RESPIRATORY_TRACT | Status: DC | PRN

## 2011-09-21 MED ORDER — SIMVASTATIN 40 MG PO TABS: 40.0000 mg | ORAL_TABLET | Freq: Every day | ORAL | Status: DC

## 2011-09-21 MED ORDER — BUDESONIDE-FORMOTEROL FUMARATE 160-4.5 MCG/ACT IN AERO: 2.0000 | INHALATION_SPRAY | Freq: Two times a day (BID) | RESPIRATORY_TRACT | Status: DC

## 2011-09-21 MED ORDER — HYDROCODONE-ACETAMINOPHEN 10-650 MG PO TABS: ORAL_TABLET | ORAL | Status: DC

## 2011-09-21 MED ORDER — MONTELUKAST SODIUM 10 MG PO TABS: 10.0000 mg | ORAL_TABLET | Freq: Every day | ORAL | Status: DC

## 2011-09-21 MED ORDER — FLUOXETINE HCL 40 MG PO CAPS: 40.0000 mg | ORAL_CAPSULE | Freq: Every day | ORAL | Status: DC

## 2011-09-21 MED ORDER — TIOTROPIUM BROMIDE MONOHYDRATE 18 MCG IN CAPS: 18.0000 ug | ORAL_CAPSULE | Freq: Every day | RESPIRATORY_TRACT | Status: DC

## 2011-09-21 MED ORDER — ALPRAZOLAM 0.25 MG PO TABS: 0.2500 mg | ORAL_TABLET | Freq: Three times a day (TID) | ORAL | Status: DC | PRN

## 2011-09-21 NOTE — Patient Instructions (Signed)
Continue all other medications as before Please go to LAB in the Basement for the blood and/or urine tests to be done today You are otherwise up to date with prevention Your medications were all refilled today Please have the pharmacy call with any other refills you may need. You will be contacted by phone if any changes need to be made immediately.  Otherwise, you will receive a letter about your results with an explanation. Please return in 1 year for your yearly visit, or sooner if needed

## 2011-09-23 ENCOUNTER — Encounter: Payer: Self-pay | Admitting: Internal Medicine

## 2011-09-23 NOTE — Progress Notes (Signed)
Subjective:    Patient ID: Andrea Larson, female    DOB: [birth year], 66 y.o.   MRN: [id number]  HPI  Here as new pt to establish, former pt of Dr Scotty Court who is now retired.  Pt denies chest pain, increased sob or doe, wheezing, orthopnea, PND, increased LE swelling, palpitations, dizziness or syncope.  Pt denies new neurological symptoms such as new headache, or facial or extremity weakness or numbness   Pt denies polydipsia, polyuria  Pt states overall good compliance with meds, trying to follow lower cholesterol diet, wt overall stable but little exercise however.  Does have sense of ongoing fatigue, but denies signficant hypersomnolence.  Denies worsening depressive symptoms, suicidal ideation, or panic, though has ongoing anxiety, not increased recently. Needs mult med refills today.  Does have several wks ongoing nasal allergy symptoms with clear congestion, itch and sneeze, without fever, pain, ST, cough or wheezing, but controlled with singulair. Past Medical History  Diagnosis Date  . Allergic rhinitis   . Hyperlipidemia   . COPD (chronic obstructive pulmonary disease)     mild   . Asthma   . Arthritis   . UTI (lower urinary tract infection)   . Colon polyps   . Asthma 09/21/2011  . Allergic rhinitis, cause unspecified 09/21/2011   Past Surgical History  Procedure Date  . Tonsillectomy     reports that she quit smoking about 7 months ago. Her smoking use included Cigarettes. She has never used smokeless tobacco. She reports that she drinks alcohol. She reports that she does not use illicit drugs. family history includes Alcohol abuse in her other; Arthritis in her other; Heart disease in her other; Hypertension in her other; and Stroke in her other. Allergies  Allergen Reactions  . Sulfa Antibiotics    Current Outpatient Prescriptions on File Prior to Visit  Medication Sig Dispense Refill  . Calcium Carbonate-Vitamin D (CALTRATE 600+D) 600-400 MG-UNIT per tablet Take 1  tablet by mouth 2 (two) times daily.        . ergocalciferol (VITAMIN D2) 50000 UNITS capsule Take 50,000 Units by mouth once a week. One by mouth weekly         Review of Systems Review of Systems  Constitutional: Negative for diaphoresis and unexpected weight change.  HENT: Negative for drooling and tinnitus.   Eyes: Negative for photophobia and visual disturbance.  Respiratory: Negative for choking and stridor.   Gastrointestinal: Negative for vomiting and blood in stool.  Genitourinary: Negative for hematuria and decreased urine volume.  Musculoskeletal: Negative for gait problem.  Skin: Negative for color change and wound.  Neurological: Negative for tremors and numbness.  Psychiatric/Behavioral: Negative for decreased concentration. The patient is not hyperactive.       Objective:   Physical Exam BP 140/84  Pulse 91  Temp(Src) 98.3 F (36.8 C) (Oral)  Ht 5\' 2"  (1.575 m)  Wt 139 lb 8 oz (63.277 kg)  BMI 25.51 kg/m2  SpO2 92% Physical Exam  VS noted Constitutional: Pt appears well-developed and well-nourished.  HENT: Head: Normocephalic.  Right Ear: External ear normal.  Left Ear: External ear normal.  Eyes: Conjunctivae and EOM are normal. Pupils are equal, round, and reactive to light.  Neck: Normal range of motion. Neck supple.  Cardiovascular: Normal rate and regular rhythm.   Pulmonary/Chest: Effort normal and breath sounds normal.  Abd:  Soft, NT, non-distended, + BS Neurological: Pt is alert. No cranial nerve deficit.  Skin: Skin is warm. No erythema.  Psychiatric: Pt  behavior is normal. Thought content normal. 1+ nervous    Assessment & Plan:

## 2011-09-23 NOTE — Assessment & Plan Note (Signed)
stable overall by hx and exam, , and pt to continue medical treatment as before   

## 2011-09-23 NOTE — Assessment & Plan Note (Signed)
stable overall by hx and exam, most recent data reviewed with pt, and pt to continue medical treatment as before  Lab Results  Component Value Date   LDLCALC 102* 02/24/2009

## 2011-09-23 NOTE — Assessment & Plan Note (Signed)
stable overall by hx and exam, most recent data reviewed with pt, and pt to continue medical treatment as before  SpO2 Readings from Last 3 Encounters:  09/21/11 92%  08/20/11 97%  02/20/11 95%

## 2011-09-23 NOTE — Assessment & Plan Note (Signed)
Etiology unclear, Exam otherwise benign, to check labs as documented, follow with expectant management  

## 2011-09-24 LAB — HEMOGLOBIN A1C: Hgb A1c MFr Bld: 5.5 % (ref 4.6–6.5)

## 2011-10-18 ENCOUNTER — Telehealth: Payer: Self-pay

## 2011-10-18 MED ORDER — ATORVASTATIN CALCIUM 10 MG PO TABS: 10.0000 mg | ORAL_TABLET | Freq: Every day | ORAL | Status: DC

## 2011-10-18 NOTE — Telephone Encounter (Signed)
Fax from pharmacy to inform the patient would like to start Lipitor but needs prescription sent to [facility]

## 2011-10-18 NOTE — Telephone Encounter (Signed)
Done per emr 

## 2011-11-07 ENCOUNTER — Other Ambulatory Visit: Payer: Self-pay | Admitting: Internal Medicine

## 2011-11-09 ENCOUNTER — Telehealth: Payer: Self-pay

## 2011-11-09 MED ORDER — HYDROCODONE-ACETAMINOPHEN 10-650 MG PO TABS: ORAL_TABLET | ORAL | Status: DC

## 2011-11-09 NOTE — Telephone Encounter (Signed)
Faxed hardcopy to pharmacy and called pharmacy informed of MD's instructions.

## 2011-11-09 NOTE — Telephone Encounter (Signed)
Pt was new pt on mar 15  Hydrocodone rx was not meant to be used as freqeuntly as she is doing  Ok for refill , but limit to 1 tab qid prn - max use 4 tabs per day

## 2011-11-09 NOTE — Telephone Encounter (Signed)
Pharmacy requesting refill on the patients hydrocodone. They are informing pt. Received #90 on 09/21/11 and #90 on 10/08/11. Is ok to refill at this time as patient states needs refill. Please advise.

## 2012-01-07 ENCOUNTER — Other Ambulatory Visit: Payer: Self-pay | Admitting: Internal Medicine

## 2012-01-07 NOTE — Telephone Encounter (Signed)
Faxed hardcopy to pharmacy. 

## 2012-01-07 NOTE — Telephone Encounter (Signed)
Done hardcopy to robin  

## 2012-01-31 ENCOUNTER — Other Ambulatory Visit: Payer: Self-pay

## 2012-01-31 MED ORDER — ALPRAZOLAM 0.25 MG PO TABS: 0.2500 mg | ORAL_TABLET | Freq: Three times a day (TID) | ORAL | Status: DC | PRN

## 2012-01-31 NOTE — Telephone Encounter (Signed)
Faxed hardcopy to pharmacy. 

## 2012-01-31 NOTE — Telephone Encounter (Signed)
Done hardcopy to robin  

## 2012-03-03 ENCOUNTER — Other Ambulatory Visit: Payer: Self-pay

## 2012-03-03 MED ORDER — HYDROCODONE-ACETAMINOPHEN 10-650 MG PO TABS: 1.0000 | ORAL_TABLET | Freq: Four times a day (QID) | ORAL | Status: DC | PRN

## 2012-03-03 NOTE — Telephone Encounter (Signed)
Faxed hardcopy to pharmacy. 

## 2012-03-03 NOTE — Telephone Encounter (Signed)
Done hardcopy to robin  

## 2012-03-16 ENCOUNTER — Other Ambulatory Visit: Payer: Self-pay | Admitting: Family Medicine

## 2012-04-30 ENCOUNTER — Other Ambulatory Visit: Payer: Self-pay

## 2012-04-30 MED ORDER — HYDROCODONE-ACETAMINOPHEN 10-650 MG PO TABS: 1.0000 | ORAL_TABLET | Freq: Four times a day (QID) | ORAL | Status: DC | PRN

## 2012-04-30 NOTE — Telephone Encounter (Signed)
Done hardcopy to robin  

## 2012-04-30 NOTE — Telephone Encounter (Signed)
Faxed hardcopy to [facility] 

## 2012-05-20 ENCOUNTER — Other Ambulatory Visit: Payer: Self-pay | Admitting: Family Medicine

## 2012-05-21 LAB — HM MAMMOGRAPHY

## 2012-05-26 ENCOUNTER — Encounter: Payer: Self-pay | Admitting: Internal Medicine

## 2012-06-02 ENCOUNTER — Encounter: Payer: Self-pay | Admitting: Internal Medicine

## 2012-06-18 ENCOUNTER — Other Ambulatory Visit: Payer: Self-pay

## 2012-06-18 MED ORDER — ALPRAZOLAM 0.25 MG PO TABS: 0.2500 mg | ORAL_TABLET | Freq: Three times a day (TID) | ORAL | Status: DC | PRN

## 2012-06-18 NOTE — Telephone Encounter (Signed)
Done hardcopy to robin  

## 2012-06-18 NOTE — Telephone Encounter (Signed)
Faxed hardcopy to pharmacy. 

## 2012-06-24 ENCOUNTER — Other Ambulatory Visit: Payer: Self-pay

## 2012-06-24 MED ORDER — HYDROCODONE-ACETAMINOPHEN 10-650 MG PO TABS: 1.0000 | ORAL_TABLET | Freq: Four times a day (QID) | ORAL | Status: DC | PRN

## 2012-06-24 NOTE — Telephone Encounter (Signed)
Done hardcopy to robin  

## 2012-06-24 NOTE — Telephone Encounter (Signed)
Faxed hardcopy to pharmacy. 

## 2012-08-18 ENCOUNTER — Other Ambulatory Visit: Payer: Self-pay

## 2012-08-18 MED ORDER — HYDROCODONE-ACETAMINOPHEN 10-650 MG PO TABS: 1.0000 | ORAL_TABLET | Freq: Four times a day (QID) | ORAL | Status: DC | PRN

## 2012-08-18 NOTE — Telephone Encounter (Signed)
Done hardcopy to robin  

## 2012-08-18 NOTE — Telephone Encounter (Signed)
Faxed hardcopy to pharmacy. 

## 2012-08-28 ENCOUNTER — Telehealth: Payer: Self-pay

## 2012-08-28 MED ORDER — HYDROCODONE-ACETAMINOPHEN 10-325 MG PO TABS: 1.0000 | ORAL_TABLET | Freq: Four times a day (QID) | ORAL | Status: DC | PRN

## 2012-08-28 NOTE — Telephone Encounter (Signed)
Faxed hardcopy to pharmacy. 

## 2012-08-28 NOTE — Telephone Encounter (Signed)
Fax from pharmacy to inform insur. Will no longer pay for 10/650 can they change to 10/325?

## 2012-08-28 NOTE — Telephone Encounter (Signed)
Done hardcopy to robin  

## 2012-10-06 ENCOUNTER — Other Ambulatory Visit: Payer: Self-pay | Admitting: Internal Medicine

## 2012-10-16 ENCOUNTER — Other Ambulatory Visit: Payer: Self-pay | Admitting: Internal Medicine

## 2012-10-17 ENCOUNTER — Other Ambulatory Visit: Payer: Self-pay

## 2012-10-17 MED ORDER — ALPRAZOLAM 0.25 MG PO TABS: 0.2500 mg | ORAL_TABLET | Freq: Three times a day (TID) | ORAL | Status: DC | PRN

## 2012-10-17 NOTE — Telephone Encounter (Signed)
Faxed hardcopy to [facility] 

## 2012-10-17 NOTE — Telephone Encounter (Signed)
Done hardcopy to robin  

## 2012-10-28 ENCOUNTER — Ambulatory Visit (INDEPENDENT_AMBULATORY_CARE_PROVIDER_SITE_OTHER): Payer: Medicare Other | Admitting: Internal Medicine

## 2012-10-28 ENCOUNTER — Ambulatory Visit (INDEPENDENT_AMBULATORY_CARE_PROVIDER_SITE_OTHER)
Admission: RE | Admit: 2012-10-28 | Discharge: 2012-10-28 | Disposition: A | Discharge status: Home / Self Care | Payer: Medicare Other | Source: Ambulatory Visit | Attending: Internal Medicine | Admitting: Internal Medicine

## 2012-10-28 ENCOUNTER — Other Ambulatory Visit (INDEPENDENT_AMBULATORY_CARE_PROVIDER_SITE_OTHER): Payer: Medicare Other

## 2012-10-28 ENCOUNTER — Telehealth: Payer: Self-pay | Admitting: Internal Medicine

## 2012-10-28 ENCOUNTER — Encounter: Payer: Self-pay | Admitting: Internal Medicine

## 2012-10-28 VITALS — BP 130/80 | HR 106 | Temp 97.1°F | Ht 62.5 in | Wt 149.1 lb

## 2012-10-28 DIAGNOSIS — Z136 Encounter for screening for cardiovascular disorders: Secondary | ICD-10-CM

## 2012-10-28 DIAGNOSIS — E785 Hyperlipidemia, unspecified: Secondary | ICD-10-CM

## 2012-10-28 DIAGNOSIS — M549 Dorsalgia, unspecified: Secondary | ICD-10-CM

## 2012-10-28 DIAGNOSIS — J4489 Other specified chronic obstructive pulmonary disease: Secondary | ICD-10-CM

## 2012-10-28 DIAGNOSIS — R000 Tachycardia, unspecified: Secondary | ICD-10-CM

## 2012-10-28 DIAGNOSIS — J45901 Unspecified asthma with (acute) exacerbation: Secondary | ICD-10-CM

## 2012-10-28 DIAGNOSIS — J309 Allergic rhinitis, unspecified: Secondary | ICD-10-CM

## 2012-10-28 DIAGNOSIS — J449 Chronic obstructive pulmonary disease, unspecified: Secondary | ICD-10-CM

## 2012-10-28 LAB — URINALYSIS, ROUTINE W REFLEX MICROSCOPIC
Ketones, ur: 40
Nitrite: NEGATIVE
Specific Gravity, Urine: 1.025 (ref 1.000–1.030)
Urine Glucose: NEGATIVE
Urobilinogen, UA: 0.2 (ref 0.0–1.0)
pH: 5.5 (ref 5.0–8.0)

## 2012-10-28 LAB — CBC WITH DIFFERENTIAL/PLATELET
Basophils Absolute: 0 10*3/uL (ref 0.0–0.1)
Basophils Relative: 0.4 % (ref 0.0–3.0)
Eosinophils Absolute: 0 10*3/uL (ref 0.0–0.7)
Eosinophils Relative: 0.4 % (ref 0.0–5.0)
HCT: 39.7 % (ref 36.0–46.0)
Hemoglobin: 13.4 g/dL (ref 12.0–15.0)
Lymphocytes Relative: 19.6 % (ref 12.0–46.0)
Lymphs Abs: 1.7 10*3/uL (ref 0.7–4.0)
MCHC: 33.7 g/dL (ref 30.0–36.0)
MCV: 95.8 fl (ref 78.0–100.0)
Monocytes Absolute: 0.4 10*3/uL (ref 0.1–1.0)
Monocytes Relative: 4.8 % (ref 3.0–12.0)
Neutro Abs: 6.6 10*3/uL (ref 1.4–7.7)
Neutrophils Relative %: 74.8 % (ref 43.0–77.0)
Platelets: 470 10*3/uL — ABNORMAL HIGH (ref 150.0–400.0)
RBC: 4.14 Mil/uL (ref 3.87–5.11)
RDW: 12.9 % (ref 11.5–14.6)
WBC: 8.8 10*3/uL (ref 4.5–10.5)

## 2012-10-28 LAB — HEMOGLOBIN A1C: Hgb A1c MFr Bld: 5.5 % (ref 4.6–6.5)

## 2012-10-28 LAB — TSH: TSH: 0.62 u[IU]/mL (ref 0.35–5.50)

## 2012-10-28 MED ORDER — CEPHALEXIN 500 MG PO CAPS: 500.0000 mg | ORAL_CAPSULE | Freq: Four times a day (QID) | ORAL | Status: DC

## 2012-10-28 MED ORDER — BUDESONIDE-FORMOTEROL FUMARATE 160-4.5 MCG/ACT IN AERO: 2.0000 | INHALATION_SPRAY | Freq: Two times a day (BID) | RESPIRATORY_TRACT | Status: DC

## 2012-10-28 MED ORDER — EZETIMIBE 10 MG PO TABS: ORAL_TABLET | ORAL | Status: DC

## 2012-10-28 MED ORDER — METHYLPREDNISOLONE ACETATE 80 MG/ML IJ SUSP
120.0000 mg | Freq: Once | INTRAMUSCULAR | Status: AC
  Administered 2012-10-28: 120 mg via INTRAMUSCULAR

## 2012-10-28 MED ORDER — HYDROCODONE-ACETAMINOPHEN 10-325 MG PO TABS: 1.0000 | ORAL_TABLET | Freq: Four times a day (QID) | ORAL | Status: DC | PRN

## 2012-10-28 MED ORDER — ALPRAZOLAM 0.25 MG PO TABS: 0.2500 mg | ORAL_TABLET | Freq: Three times a day (TID) | ORAL | Status: DC | PRN

## 2012-10-28 MED ORDER — ROSUVASTATIN CALCIUM 20 MG PO TABS: 20.0000 mg | ORAL_TABLET | Freq: Every day | ORAL | Status: DC

## 2012-10-28 MED ORDER — ALBUTEROL SULFATE HFA 108 (90 BASE) MCG/ACT IN AERS: INHALATION_SPRAY | RESPIRATORY_TRACT | Status: DC

## 2012-10-28 MED ORDER — TIOTROPIUM BROMIDE MONOHYDRATE 18 MCG IN CAPS: 18.0000 ug | ORAL_CAPSULE | Freq: Every day | RESPIRATORY_TRACT | Status: DC

## 2012-10-28 MED ORDER — FLUOXETINE HCL 20 MG PO CAPS: 20.0000 mg | ORAL_CAPSULE | Freq: Every day | ORAL | Status: DC

## 2012-10-28 MED ORDER — MONTELUKAST SODIUM 10 MG PO TABS: 10.0000 mg | ORAL_TABLET | Freq: Every day | ORAL | Status: DC

## 2012-10-28 MED ORDER — PREDNISONE 10 MG PO TABS: ORAL_TABLET | ORAL | Status: DC

## 2012-10-28 NOTE — Patient Instructions (Addendum)
Your EKG was OK today You had the steroid shot today Please take all new medication as prescribed - the prednisone OK to re-start the crestor at 20 mg per day Please continue all other medications as before, and refills have been done if requested  Please continue your efforts at being more active, low cholesterol diet, and weight control. You are otherwise up to date with prevention measures today. Please go to the LAB in the Basement (turn left off the elevator) for the tests to be done today Please go to the XRAY Department in the Basement (go straight as you get off the elevator) for the x-ray testing You will be contacted by phone if any changes need to be made immediately.  Otherwise, you will receive a letter about your results with an explanation Please remember to sign up for My Chart if you have not done so, as this will be important to you in the future with finding out test results, communicating by private email, and scheduling acute appointments online when needed. Please return in 6 months, or sooner if needed

## 2012-10-28 NOTE — Assessment & Plan Note (Addendum)
Mild to mod, for depomedrol IM, adn predpack asd,,  to f/u any worsening symptoms or concerns  Note:  Total time for pt hx, exam, review of record with pt in the room, determination of diagnoses and plan for further eval and tx is > 40 min, with over 50% spent in coordination and counseling of patient

## 2012-10-28 NOTE — Assessment & Plan Note (Signed)
Cont meds   

## 2012-10-28 NOTE — Progress Notes (Signed)
Subjective:    Patient ID: Andrea Larson, female    DOB: [birth year], 67 y.o.   MRN: [id number]  HPI here for yearly f/u, c/o chest pressure intermittent since last march after moved back from [facility], mild, last hours at a time, assoc with sob but nonexertional/nonpleuritic, not assoc with fever, diaphoresis (though has some night sweats and warmth), n/v, palps.  Pt denies  orthopnea, PND, increased LE swelling, palpitations, dizziness or syncope. Quit smoking about 2 yrs ago, but prior 1ppd for > 40 yrs.   Does have several wks ongoing nasal allergy symptoms with clearish congestion, itch and sneezing, without fever, pain, ST, cough, swelling or wheezing, not better with mucinex, and few cough seems to lowe left rib cage sore to touch, also been using inhalers more.  Has other occas nausea and xanax helps, Denies worsening depressive symptoms, suicidal ideation, or panic; has ongoing anxiety.  Pt continues to have recurring right LBP without change in severity, overall mild, and no bowel or bladder change, fever, wt loss,  worsening LE pain/numbness/weakness, gait change or falls; felt worse to "get after the kids" with more walking and standing during the kids easter egg hunt 2 days ago, felt worse to lie down that night, and somewhat improved overall today.   Better with current pain med. Trying to follow lower chol diet., could not tolerate lipitor in past, wants to re-start crestor since she tolerated this before, only changed due to cost. Past Medical History  Diagnosis Date  . Allergic rhinitis   . Hyperlipidemia   . COPD (chronic obstructive pulmonary disease)     mild   . Asthma   . Arthritis   . UTI (lower urinary tract infection)   . Colon polyps   . Asthma 09/21/2011  . Allergic rhinitis, cause unspecified 09/21/2011   Past Surgical History  Procedure Laterality Date  . Tonsillectomy      reports that she quit smoking about 20 months ago. Her smoking use included Cigarettes. She  smoked 0.00 packs per day. She has never used smokeless tobacco. She reports that  drinks alcohol. She reports that she does not use illicit drugs. family history includes Alcohol abuse in her other; Arthritis in her other; Heart disease in her other; Hypertension in her other; and Stroke in her other. Allergies  Allergen Reactions  . Lipitor (Atorvastatin)     myalgias  . Sulfa Antibiotics    Current Outpatient Prescriptions on File Prior to Visit  Medication Sig Dispense Refill  . Calcium Carbonate-Vitamin D (CALTRATE 600+D) 600-400 MG-UNIT per tablet Take 1 tablet by mouth 2 (two) times daily.        . traMADol (ULTRAM) 50 MG tablet 1 tab qid to prevent cough  100 tablet  2  . atorvastatin (LIPITOR) 10 MG tablet Take 1 tablet (10 mg total) by mouth daily.  90 tablet  3  . citalopram (CELEXA) 20 MG tablet Take 1 tablet (20 mg total) by mouth daily.  30 tablet  5  . FLUoxetine (PROZAC) 40 MG capsule Take 1 capsule (40 mg total) by mouth daily.  30 capsule  11   No current facility-administered medications on file prior to visit.   Review of Systems  Constitutional: Negative for unexpected weight change, or unusual diaphoresis  HENT: Negative for tinnitus.   Eyes: Negative for photophobia and visual disturbance.  Respiratory: Negative for choking and stridor.   Gastrointestinal: Negative for vomiting and blood in stool.  Genitourinary: Negative for hematuria and decreased  urine volume.  Musculoskeletal: Negative for acute joint swelling Skin: Negative for color change and wound.  Neurological: Negative for tremors and numbness other than noted  Psychiatric/Behavioral: Negative for decreased concentration or  hyperactivity.       Objective:   Physical Exam BP 130/80  Pulse 106  Temp(Src) 97.1 F (36.2 C) (Oral)  Ht 5' 2.5" (1.588 m)  Wt 149 lb 2 oz (67.643 kg)  BMI 26.82 kg/m2  SpO2 94% VS noted,  Constitutional: Pt appears well-developed and well-nourished.  HENT: Head:  NCAT.  Right Ear: External ear normal.  Left Ear: External ear normal.  Bilat tm's with mild erythema.  Max sinus areas non tender.  Pharynx with mild erythema, no exudate Eyes: Conjunctivae and EOM are normal. Pupils are equal, round, and reactive to light.  Neck: Normal range of motion. Neck supple.  Cardiovascular: tachy rate and regular rhythm.   Pulmonary/Chest: Effort normal and breath sounds mild decreased, few wheeze bilat.  Abd:  Soft, NT, non-distended, + BS Spine nontender, mild tender right buttock at SI joint area Neurological: Pt is alert. Not confused , motor/dtr/sens/gait intact Skin: Skin is warm. No erythema. No LE edema Psychiatric: Pt behavior is normal. Thought content normal. 2+ nervous    Assessment & Plan:

## 2012-10-28 NOTE — Telephone Encounter (Signed)
Besides the tx pt received at her OV today, UA on labs appears c/w UTI as well  Ok for antibx - done erx  Robin to let pt know

## 2012-10-28 NOTE — Assessment & Plan Note (Signed)
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

## 2012-10-28 NOTE — Assessment & Plan Note (Signed)
Likely due to nervous, back pain, wheezing -  to f/u any worsening symptoms or concerns

## 2012-10-28 NOTE — Assessment & Plan Note (Signed)
For same tx as per asthma exac,  to f/u any worsening symptoms or concerns

## 2012-10-28 NOTE — Assessment & Plan Note (Deleted)

## 2012-10-28 NOTE — Assessment & Plan Note (Signed)
stable overall by history and exam, recent data reviewed with pt, and pt to continue medical treatment as before,  to f/u any worsening symptoms or concerns Lab Results  Component Value Date   LDLCALC 102* 02/24/2009   For lipids today

## 2012-10-29 ENCOUNTER — Telehealth: Payer: Self-pay | Admitting: Internal Medicine

## 2012-10-29 ENCOUNTER — Encounter: Payer: Self-pay | Admitting: Internal Medicine

## 2012-10-29 LAB — PRO B NATRIURETIC PEPTIDE: Pro B Natriuretic peptide (BNP): 535.7 pg/mL — ABNORMAL HIGH (ref <126)

## 2012-10-29 LAB — BASIC METABOLIC PANEL
BUN: 17 mg/dL (ref 6–23)
CO2: 25 mEq/L (ref 19–32)
Calcium: 8.9 mg/dL (ref 8.4–10.5)
Chloride: 98 mEq/L (ref 96–112)
Creatinine, Ser: 1 mg/dL (ref 0.4–1.2)
GFR: 61.96 mL/min (ref >60.00)
Glucose, Bld: 82 mg/dL (ref 70–99)
Potassium: 4.3 mEq/L (ref 3.5–5.1)
Sodium: 135 mEq/L (ref 135–145)

## 2012-10-29 LAB — HEPATIC FUNCTION PANEL
ALT: 21 U/L (ref 0–35)
AST: 27 U/L (ref 0–37)
Albumin: 4 g/dL (ref 3.5–5.2)
Alkaline Phosphatase: 63 U/L (ref 39–117)
Bilirubin, Direct: 0.1 mg/dL (ref 0.0–0.3)
Total Bilirubin: 1 mg/dL (ref 0.3–1.2)
Total Protein: 7.5 g/dL (ref 6.0–8.3)

## 2012-10-29 LAB — LIPID PANEL
Cholesterol: 171 mg/dL (ref 0–200)
HDL: 67 mg/dL (ref >39.00)
LDL Cholesterol: 79 mg/dL (ref 0–99)
Total CHOL/HDL Ratio: 3
Triglycerides: 127 mg/dL (ref 0.0–149.0)
VLDL: 25.4 mg/dL (ref 0.0–40.0)

## 2012-10-29 NOTE — Telephone Encounter (Signed)
Andrea Larson, sorry, antibx rx was already done apr 22

## 2012-10-29 NOTE — Telephone Encounter (Signed)
Called informed the patient of results and antibx sent in.

## 2012-12-24 ENCOUNTER — Other Ambulatory Visit: Payer: Self-pay

## 2012-12-24 MED ORDER — HYDROCODONE-ACETAMINOPHEN 10-325 MG PO TABS: 1.0000 | ORAL_TABLET | Freq: Four times a day (QID) | ORAL | Status: DC | PRN

## 2012-12-24 NOTE — Telephone Encounter (Signed)
Done hardcopy to robin  

## 2012-12-24 NOTE — Telephone Encounter (Signed)
Faxed hardcopy to [facility] 

## 2013-02-10 ENCOUNTER — Encounter: Payer: Self-pay | Admitting: Gastroenterology

## 2013-02-20 ENCOUNTER — Other Ambulatory Visit: Payer: Self-pay

## 2013-02-20 MED ORDER — HYDROCODONE-ACETAMINOPHEN 10-325 MG PO TABS: 1.0000 | ORAL_TABLET | Freq: Four times a day (QID) | ORAL | Status: DC | PRN

## 2013-02-20 NOTE — Telephone Encounter (Signed)
Done hardcopy to robin  

## 2013-02-20 NOTE — Telephone Encounter (Signed)
Faxed hardcopy to [facility] [place] 

## 2013-04-20 ENCOUNTER — Other Ambulatory Visit: Payer: Self-pay

## 2013-04-20 MED ORDER — ROSUVASTATIN CALCIUM 20 MG PO TABS: 20.0000 mg | ORAL_TABLET | Freq: Every day | ORAL | Status: DC

## 2013-04-20 MED ORDER — ALBUTEROL SULFATE HFA 108 (90 BASE) MCG/ACT IN AERS: INHALATION_SPRAY | RESPIRATORY_TRACT | Status: DC

## 2013-04-29 ENCOUNTER — Encounter: Payer: Self-pay | Admitting: Internal Medicine

## 2013-04-29 ENCOUNTER — Ambulatory Visit (INDEPENDENT_AMBULATORY_CARE_PROVIDER_SITE_OTHER): Payer: Medicare Other | Admitting: Internal Medicine

## 2013-04-29 VITALS — BP 132/100 | HR 126 | Temp 99.3°F | Ht 61.5 in | Wt 143.2 lb

## 2013-04-29 DIAGNOSIS — F341 Dysthymic disorder: Secondary | ICD-10-CM

## 2013-04-29 DIAGNOSIS — E785 Hyperlipidemia, unspecified: Secondary | ICD-10-CM

## 2013-04-29 DIAGNOSIS — Z23 Encounter for immunization: Secondary | ICD-10-CM

## 2013-04-29 DIAGNOSIS — J449 Chronic obstructive pulmonary disease, unspecified: Secondary | ICD-10-CM

## 2013-04-29 MED ORDER — ALPRAZOLAM 0.25 MG PO TABS: 0.2500 mg | ORAL_TABLET | Freq: Three times a day (TID) | ORAL | Status: DC | PRN

## 2013-04-29 MED ORDER — HYDROCODONE-ACETAMINOPHEN 10-325 MG PO TABS: 1.0000 | ORAL_TABLET | Freq: Four times a day (QID) | ORAL | Status: DC | PRN

## 2013-04-29 NOTE — Assessment & Plan Note (Signed)
stable overall by history and exam, recent data reviewed with pt, and pt to continue medical treatment as before,  to f/u any worsening symptoms or concerns Lab Results  Component Value Date   LDLCALC 79 10/28/2012

## 2013-04-29 NOTE — Assessment & Plan Note (Signed)
stable overall by history and exam, recent data reviewed with pt, and pt to continue medical treatment as before,  to f/u any worsening symptoms or concerns SpO2 Readings from Last 3 Encounters:  04/29/13 94%  10/28/12 94%  09/21/11 92%

## 2013-04-29 NOTE — Progress Notes (Signed)
Subjective:    Patient ID: Andrea Larson, female    DOB: 07/27/1945, 67 y.o.   MRN: [id number]  HPI  Here to f/u; overall doing ok,  Pt denies chest pain, increased sob or doe, wheezing, orthopnea, PND, increased LE swelling, palpitations, dizziness or syncope.  Pt denies polydipsia, polyuria, or low sugar symptoms such as weakness or confusion improved with po intake.  Pt denies new neurological symptoms such as new headache, or facial or extremity weakness or numbness.   Pt states overall good compliance with meds, has been trying to follow lower cholesterol, diabetic diet, with wt overall stable,  but little exercise however.  Trying to quit the cigs with ecigs but not working so far.  Overall pain ok. Denies worsening depressive symptoms, suicidal ideation, or panic; has ongoing anxiety, not increased recently.   Needs med refills. Past Medical History  Diagnosis Date  . Allergic rhinitis   . Hyperlipidemia   . COPD (chronic obstructive pulmonary disease)     mild   . Asthma   . Arthritis   . UTI (lower urinary tract infection)   . Colon polyps   . Asthma 09/21/2011  . Allergic rhinitis, cause unspecified 09/21/2011   Past Surgical History  Procedure Laterality Date  . Tonsillectomy      reports that she quit smoking about 2 years ago. Her smoking use included Cigarettes. She smoked 0.00 packs per day. She has never used smokeless tobacco. She reports that she drinks alcohol. She reports that she does not use illicit drugs. family history includes Alcohol abuse in her other; Arthritis in her other; Heart disease in her other; Hypertension in her other; Stroke in her other. Allergies  Allergen Reactions  . Lipitor [Atorvastatin]     myalgias  . Sulfa Antibiotics    Current Outpatient Prescriptions on File Prior to Visit  Medication Sig Dispense Refill  . albuterol (PROAIR HFA) 108 (90 BASE) MCG/ACT inhaler USE 2 PUFFS EVERY 6 HOURS AS NEEDED FOR WHEEZING.  8.5 g  5  . ALPRAZolam  (XANAX) 0.25 MG tablet Take 1 tablet (0.25 mg total) by mouth 3 (three) times daily as needed.  90 tablet  2  . aspirin 81 MG tablet Take 81 mg by mouth daily.      . budesonide-formoterol (SYMBICORT) 160-4.5 MCG/ACT inhaler Inhale 2 puffs into the lungs 2 (two) times daily.  30.6 g  5  . Calcium Carbonate-Vitamin D (CALTRATE 600+D) 600-400 MG-UNIT per tablet Take 1 tablet by mouth 2 (two) times daily.        Marland Kitchen ezetimibe (ZETIA) 10 MG tablet TAKE 1 TABLET ONCE DAILY.  90 tablet  3  . FLUoxetine (PROZAC) 20 MG capsule Take 1 capsule (20 mg total) by mouth daily.  90 capsule  3  . HYDROcodone-acetaminophen (NORCO) 10-325 MG per tablet Take 1 tablet by mouth every 6 (six) hours as needed for pain. With limit 4 tabs per day  120 tablet  1  . montelukast (SINGULAIR) 10 MG tablet Take 1 tablet (10 mg total) by mouth daily.  90 tablet  3  . rosuvastatin (CRESTOR) 20 MG tablet Take 1 tablet (20 mg total) by mouth daily.  90 tablet  3  . tiotropium (SPIRIVA HANDIHALER) 18 MCG inhalation capsule Place 1 capsule (18 mcg total) into inhaler and inhale daily.  90 capsule  3  . traMADol (ULTRAM) 50 MG tablet 1 tab qid to prevent cough  100 tablet  2  . atorvastatin (LIPITOR) 10 MG  tablet Take 1 tablet (10 mg total) by mouth daily.  90 tablet  3  . citalopram (CELEXA) 20 MG tablet Take 1 tablet (20 mg total) by mouth daily.  30 tablet  5  . FLUoxetine (PROZAC) 40 MG capsule Take 1 capsule (40 mg total) by mouth daily.  30 capsule  11   No current facility-administered medications on file prior to visit.   Review of Systems  Constitutional: Negative for unexpected weight change, or unusual diaphoresis  HENT: Negative for tinnitus.   Eyes: Negative for photophobia and visual disturbance.  Respiratory: Negative for choking and stridor.   Gastrointestinal: Negative for vomiting and blood in stool.  Genitourinary: Negative for hematuria and decreased urine volume.  Musculoskeletal: Negative for acute joint  swelling Skin: Negative for color change and wound.  Neurological: Negative for tremors and numbness other than noted  Psychiatric/Behavioral: Negative for decreased concentration or  hyperactivity.       Objective:   Physical Exam BP 132/100  Pulse 126  Temp(Src) 99.3 F (37.4 C) (Oral)  Ht 5' 1.5" (1.562 m)  Wt 143 lb 4 oz (64.978 kg)  BMI 26.63 kg/m2  SpO2 94% VS noted,  Constitutional: Pt appears well-developed and well-nourished.  HENT: Head: NCAT.  Right Ear: External ear normal.  Left Ear: External ear normal.  Eyes: Conjunctivae and EOM are normal. Pupils are equal, round, and reactive to light.  Neck: Normal range of motion. Neck supple.  Cardiovascular: Normal rate and regular rhythm.   Pulmonary/Chest: Effort normal and breath sounds normal.  Abd:  Soft, NT, non-distended, + BS Neurological: Pt is alert. Not confused  Skin: Skin is warm. No erythema.  Psychiatric: Pt behavior is normal. Thought content normal. 1+ nervous    Assessment & Plan:

## 2013-04-29 NOTE — Assessment & Plan Note (Signed)
stable overall by history and exam, recent data reviewed with pt, and pt to continue medical treatment as before,  to f/u any worsening symptoms or concerns Lab Results  Component Value Date   WBC 8.8 10/28/2012   HGB 13.4 10/28/2012   HCT 39.7 10/28/2012   PLT 470.0* 10/28/2012   GLUCOSE 82 10/28/2012   CHOL 171 10/28/2012   TRIG 127.0 10/28/2012   HDL 67.00 10/28/2012   LDLDIRECT 124.8 09/21/2011   LDLCALC 79 10/28/2012   ALT 21 10/28/2012   AST 27 10/28/2012   NA 135 10/28/2012   K 4.3 10/28/2012   CL 98 10/28/2012   CREATININE 1.0 10/28/2012   BUN 17 10/28/2012   CO2 25 10/28/2012   TSH 0.62 10/28/2012   HGBA1C 5.5 10/28/2012

## 2013-04-29 NOTE — Patient Instructions (Addendum)
You had the new flu shot today Please return in 2 wks with the Nurse Visit for the Prevnar pneumonia shot Please continue all other medications as before, and refills have been done if requested. Please have the pharmacy call with any other refills you may need.  Please keep your appointments with your specialists as you have planned  No need for further labs or tx today  Please return in 6 months, or sooner if needed

## 2013-05-13 ENCOUNTER — Ambulatory Visit (INDEPENDENT_AMBULATORY_CARE_PROVIDER_SITE_OTHER): Payer: Medicare Other

## 2013-05-13 DIAGNOSIS — Z23 Encounter for immunization: Secondary | ICD-10-CM

## 2013-07-30 ENCOUNTER — Telehealth: Payer: Self-pay | Admitting: *Deleted

## 2013-07-30 ENCOUNTER — Other Ambulatory Visit: Payer: Self-pay | Admitting: Internal Medicine

## 2013-07-30 DIAGNOSIS — G894 Chronic pain syndrome: Secondary | ICD-10-CM

## 2013-07-30 MED ORDER — HYDROCODONE-ACETAMINOPHEN 10-325 MG PO TABS: 1.0000 | ORAL_TABLET | Freq: Four times a day (QID) | ORAL | Status: DC | PRN

## 2013-07-30 NOTE — Telephone Encounter (Signed)
See other note today.

## 2013-07-30 NOTE — Telephone Encounter (Signed)
Left msg on triage wanting refill on her hydrocodone...Johny Chess

## 2013-07-30 NOTE — Telephone Encounter (Signed)
Called left message to call back 

## 2013-07-30 NOTE — Telephone Encounter (Signed)
Done hardcopy to robin., but also to let pt know  You are given the letter today explaining the transitional pain medication refill policy due to change in [place] law, and [place] [facility] Regulations  Please be aware that I will no longer be able to offer monthly refills of any Schedule II or higher medication starting Aug 09, 2013  I can try to refer to pain management if the pt would like this

## 2013-07-30 NOTE — Telephone Encounter (Signed)
referral done.

## 2013-07-30 NOTE — Telephone Encounter (Signed)
Called the patient informed hardcopy's are ready for pickup at the front desk.  Also explained enclosed letter regarding pain medication change in refill policy.  The patient did understand and would like a pain management referral.

## 2013-10-01 ENCOUNTER — Other Ambulatory Visit: Payer: Self-pay

## 2013-10-01 MED ORDER — BUDESONIDE-FORMOTEROL FUMARATE 160-4.5 MCG/ACT IN AERO: 2.0000 | INHALATION_SPRAY | Freq: Two times a day (BID) | RESPIRATORY_TRACT | Status: DC

## 2013-10-28 ENCOUNTER — Other Ambulatory Visit (INDEPENDENT_AMBULATORY_CARE_PROVIDER_SITE_OTHER): Payer: Medicare Other

## 2013-10-28 ENCOUNTER — Encounter: Payer: Self-pay | Admitting: Internal Medicine

## 2013-10-28 ENCOUNTER — Ambulatory Visit (INDEPENDENT_AMBULATORY_CARE_PROVIDER_SITE_OTHER): Payer: Medicare Other | Admitting: Internal Medicine

## 2013-10-28 VITALS — BP 122/80 | HR 98 | Temp 99.7°F | Resp 12 | Wt 127.1 lb

## 2013-10-28 DIAGNOSIS — Z0000 Encounter for general adult medical examination without abnormal findings: Secondary | ICD-10-CM

## 2013-10-28 DIAGNOSIS — E785 Hyperlipidemia, unspecified: Secondary | ICD-10-CM

## 2013-10-28 DIAGNOSIS — R7302 Impaired glucose tolerance (oral): Secondary | ICD-10-CM

## 2013-10-28 DIAGNOSIS — J449 Chronic obstructive pulmonary disease, unspecified: Secondary | ICD-10-CM

## 2013-10-28 DIAGNOSIS — R7309 Other abnormal glucose: Secondary | ICD-10-CM

## 2013-10-28 LAB — HEMOGLOBIN A1C: Hgb A1c MFr Bld: 5.3 % (ref 4.6–6.5)

## 2013-10-28 LAB — URINALYSIS, ROUTINE W REFLEX MICROSCOPIC
Bilirubin Urine: NEGATIVE
Hgb urine dipstick: NEGATIVE
Ketones, ur: NEGATIVE
Leukocytes, UA: NEGATIVE
Nitrite: NEGATIVE
RBC / HPF: NONE SEEN (ref 0–?)
Specific Gravity, Urine: 1.015 (ref 1.000–1.030)
Total Protein, Urine: NEGATIVE
Urine Glucose: NEGATIVE
Urobilinogen, UA: 0.2 (ref 0.0–1.0)
WBC, UA: NONE SEEN (ref 0–?)
pH: 5.5 (ref 5.0–8.0)

## 2013-10-28 LAB — CBC WITH DIFFERENTIAL/PLATELET
Basophils Absolute: 0.1 10*3/uL (ref 0.0–0.1)
Basophils Relative: 1.2 % (ref 0.0–3.0)
Eosinophils Absolute: 0.1 10*3/uL (ref 0.0–0.7)
Eosinophils Relative: 0.9 % (ref 0.0–5.0)
HCT: 43.8 % (ref 36.0–46.0)
Hemoglobin: 14.7 g/dL (ref 12.0–15.0)
Lymphocytes Relative: 24.8 % (ref 12.0–46.0)
Lymphs Abs: 2.4 10*3/uL (ref 0.7–4.0)
MCHC: 33.5 g/dL (ref 30.0–36.0)
MCV: 96.3 fl (ref 78.0–100.0)
Monocytes Absolute: 0.9 10*3/uL (ref 0.1–1.0)
Monocytes Relative: 9.8 % (ref 3.0–12.0)
Neutro Abs: 6.1 10*3/uL (ref 1.4–7.7)
Neutrophils Relative %: 63.3 % (ref 43.0–77.0)
Platelets: 331 10*3/uL (ref 150.0–400.0)
RBC: 4.56 Mil/uL (ref 3.87–5.11)
RDW: 13.6 % (ref 11.5–14.6)
WBC: 9.7 10*3/uL (ref 4.5–10.5)

## 2013-10-28 LAB — BASIC METABOLIC PANEL
BUN: 18 mg/dL (ref 6–23)
CO2: 22 mEq/L (ref 19–32)
Calcium: 9.2 mg/dL (ref 8.4–10.5)
Chloride: 98 mEq/L (ref 96–112)
Creatinine, Ser: 0.9 mg/dL (ref 0.4–1.2)
GFR: 62.54 mL/min (ref >60.00)
Glucose, Bld: 87 mg/dL (ref 70–99)
Potassium: 4 mEq/L (ref 3.5–5.1)
Sodium: 131 mEq/L — ABNORMAL LOW (ref 135–145)

## 2013-10-28 LAB — HEPATIC FUNCTION PANEL
ALT: 26 U/L (ref 0–35)
AST: 24 U/L (ref 0–37)
Albumin: 3.7 g/dL (ref 3.5–5.2)
Alkaline Phosphatase: 48 U/L (ref 39–117)
Bilirubin, Direct: 0.1 mg/dL (ref 0.0–0.3)
Total Bilirubin: 0.6 mg/dL (ref 0.3–1.2)
Total Protein: 7.1 g/dL (ref 6.0–8.3)

## 2013-10-28 LAB — LIPID PANEL
Cholesterol: 159 mg/dL (ref 0–200)
HDL: 63.9 mg/dL (ref >39.00)
LDL Cholesterol: 55 mg/dL (ref 0–99)
Total CHOL/HDL Ratio: 2
Triglycerides: 201 mg/dL — ABNORMAL HIGH (ref 0.0–149.0)
VLDL: 40.2 mg/dL — ABNORMAL HIGH (ref 0.0–40.0)

## 2013-10-28 MED ORDER — TIOTROPIUM BROMIDE MONOHYDRATE 2.5 MCG/ACT IN AERS: 1.0000 | INHALATION_SPRAY | Freq: Every day | RESPIRATORY_TRACT | Status: DC

## 2013-10-28 NOTE — Patient Instructions (Addendum)
Please continue all other medications as before, and refills have been done if requested. Please have the pharmacy call with any other refills you may need.  Please continue your efforts at being more active, low cholesterol diet, and weight control. You are otherwise up to date with prevention measures today.  Please remember to followup with your GYN for the yearly pap smear and/or mammogram  Please remember to call for your colonoscopy follow up with Dr Fuller Plan.  Please go to the LAB in the Basement (turn left off the elevator) for the tests to be done today  You will be contacted by phone if any changes need to be made immediately.  Otherwise, you will receive a letter about your results with an explanation, but please check with MyChart first.  Please remember to sign up for MyChart if you have not done so, as this will be important to you in the future with finding out test results, communicating by private email, and scheduling acute appointments online when needed.  Please return in 1 year for your yearly visit, or sooner if needed

## 2013-10-28 NOTE — Progress Notes (Signed)
Subjective:    Patient ID: Andrea Larson, female    DOB: Oct 30, 1945, 68 y.o.   MRN: [id number]  HPI Here for wellness and f/u;  Overall doing ok;  Pt denies CP, worsening SOB, DOE, wheezing, orthopnea, PND, worsening LE edema, palpitations, dizziness or syncope.  Pt denies neurological change such as new headache, facial or extremity weakness.  Pt denies polydipsia, polyuria, or low sugar symptoms. Pt states overall good compliance with treatment and medications, good tolerability, and has been trying to follow lower cholesterol diet.  Pt denies worsening depressive symptoms, suicidal ideation or panic. No fever, night sweats, wt loss, loss of appetite, or other constitutional symptoms.  Pt states good ability with ADL's, has low fall risk, home safety reviewed and adequate, no other significant changes in hearing or vision, and only occasionally active with exercise.  No current complaints Past Medical History  Diagnosis Date  . Allergic rhinitis   . Hyperlipidemia   . COPD (chronic obstructive pulmonary disease)     mild   . Asthma   . Arthritis   . UTI (lower urinary tract infection)   . Colon polyps   . Asthma 09/21/2011  . Allergic rhinitis, cause unspecified 09/21/2011   Past Surgical History  Procedure Laterality Date  . Tonsillectomy      reports that she quit smoking about 2 years ago. Her smoking use included Cigarettes. She smoked 0.00 packs per day. She has never used smokeless tobacco. She reports that she drinks alcohol. She reports that she does not use illicit drugs. family history includes Alcohol abuse in her other; Arthritis in her other; Heart disease in her other; Hypertension in her other; Stroke in her other. Allergies  Allergen Reactions  . Lipitor [Atorvastatin]     myalgias  . Sulfa Antibiotics    Current Outpatient Prescriptions on File Prior to Visit  Medication Sig Dispense Refill  . albuterol (PROAIR HFA) 108 (90 BASE) MCG/ACT inhaler USE 2 PUFFS  EVERY 6 HOURS AS NEEDED FOR WHEEZING.  8.5 g  5  . ALPRAZolam (XANAX) 0.25 MG tablet Take 1 tablet (0.25 mg total) by mouth 3 (three) times daily as needed.  90 tablet  5  . aspirin 81 MG tablet Take 81 mg by mouth daily.      . budesonide-formoterol (SYMBICORT) 160-4.5 MCG/ACT inhaler Inhale 2 puffs into the lungs 2 (two) times daily.  30.6 g  11  . Calcium Carbonate-Vitamin D (CALTRATE 600+D) 600-400 MG-UNIT per tablet Take 1 tablet by mouth 2 (two) times daily.        Marland Kitchen ezetimibe (ZETIA) 10 MG tablet TAKE 1 TABLET ONCE DAILY.  90 tablet  3  . HYDROcodone-acetaminophen (NORCO) 10-325 MG per tablet Take 1 tablet by mouth every 6 (six) hours as needed. With limit 4 tabs per day  - to fill Sep 27, 2013  120 tablet  0  . montelukast (SINGULAIR) 10 MG tablet Take 1 tablet (10 mg total) by mouth daily.  90 tablet  3  . rosuvastatin (CRESTOR) 20 MG tablet Take 1 tablet (20 mg total) by mouth daily.  90 tablet  3  . traMADol (ULTRAM) 50 MG tablet 1 tab qid to prevent cough  100 tablet  2  . atorvastatin (LIPITOR) 10 MG tablet Take 1 tablet (10 mg total) by mouth daily.  90 tablet  3  . citalopram (CELEXA) 20 MG tablet Take 1 tablet (20 mg total) by mouth daily.  30 tablet  5  . FLUoxetine (  PROZAC) 40 MG capsule Take 1 capsule (40 mg total) by mouth daily.  30 capsule  11   No current facility-administered medications on file prior to visit.   Review of Systems Constitutional: Negative for increased diaphoresis, other activity, appetite or other siginficant weight change  HENT: Negative for worsening hearing loss, ear pain, facial swelling, mouth sores and neck stiffness.   Eyes: Negative for other worsening pain, redness or visual disturbance.  Respiratory: Negative for shortness of breath and wheezing.   Cardiovascular: Negative for chest pain and palpitations.  Gastrointestinal: Negative for diarrhea, blood in stool, abdominal distention or other pain Genitourinary: Negative for hematuria, flank  pain or change in urine volume.  Musculoskeletal: Negative for myalgias or other joint complaints.  Skin: Negative for color change and wound.  Neurological: Negative for syncope and numbness. other than noted Hematological: Negative for adenopathy. or other swelling Psychiatric/Behavioral: Negative for hallucinations, self-injury, decreased concentration or other worsening agitation. ]     Objective:   Physical Exam BP 122/80  Pulse 98  Temp(Src) 99.7 F (37.6 C) (Oral)  Resp 12  Wt 127 lb 1.9 oz (57.661 kg)  SpO2 97% VS noted,  Constitutional: Pt is oriented to person, place, and time. Appears well-developed and well-nourished.  Head: Normocephalic and atraumatic.  Right Ear: External ear normal.  Left Ear: External ear normal.  Nose: Nose normal.  Mouth/Throat: Oropharynx is clear and moist.  Eyes: Conjunctivae and EOM are normal. Pupils are equal, round, and reactive to light.  Neck: Normal range of motion. Neck supple. No JVD present. No tracheal deviation present.  Cardiovascular: Normal rate, regular rhythm, normal heart sounds and intact distal pulses.   Pulmonary/Chest: Effort normal and breath sounds without rales or wheezing  Abdominal: Soft. Bowel sounds are normal. NT. No HSM  Musculoskeletal: Normal range of motion. Exhibits no edema.  Lymphadenopathy:  Has no cervical adenopathy.  Neurological: Pt is alert and oriented to person, place, and time. Pt has normal reflexes. No cranial nerve deficit. Motor grossly intact Skin: Skin is warm and dry. No rash noted.  Psychiatric:  Has normal mood and affect. Behavior is normal.     Assessment & Plan:

## 2013-10-29 ENCOUNTER — Encounter: Payer: Self-pay | Admitting: Internal Medicine

## 2013-10-29 LAB — TSH: TSH: 2.32 u[IU]/mL (ref 0.35–5.50)

## 2013-10-31 DIAGNOSIS — R7302 Impaired glucose tolerance (oral): Secondary | ICD-10-CM | POA: Insufficient documentation

## 2013-10-31 NOTE — Assessment & Plan Note (Signed)

## 2013-10-31 NOTE — Assessment & Plan Note (Signed)
stable overall by history and exam, recent data reviewed with pt, and pt to continue medical treatment as before,  to f/u any worsening symptoms or concerns SpO2 Readings from Last 3 Encounters:  10/28/13 97%  04/29/13 94%  10/28/12 94%

## 2013-10-31 NOTE — Assessment & Plan Note (Signed)
Also for a1c 

## 2013-11-05 ENCOUNTER — Other Ambulatory Visit: Payer: Self-pay

## 2013-11-05 MED ORDER — ALPRAZOLAM 0.25 MG PO TABS: 0.2500 mg | ORAL_TABLET | Freq: Three times a day (TID) | ORAL | Status: DC | PRN

## 2013-11-05 NOTE — Telephone Encounter (Signed)
Done hardcopy to robin  

## 2013-11-05 NOTE — Telephone Encounter (Signed)
Faxed hardcopy to [facility] [address]

## 2013-11-24 ENCOUNTER — Encounter: Payer: Self-pay | Admitting: Internal Medicine

## 2013-12-08 ENCOUNTER — Other Ambulatory Visit: Payer: Self-pay | Admitting: Internal Medicine

## 2013-12-20 ENCOUNTER — Other Ambulatory Visit: Payer: Self-pay | Admitting: Internal Medicine

## 2014-03-25 ENCOUNTER — Other Ambulatory Visit: Payer: Self-pay | Admitting: Internal Medicine

## 2014-03-25 MED ORDER — MONTELUKAST SODIUM 10 MG PO TABS: ORAL_TABLET | ORAL | Status: DC

## 2014-05-07 ENCOUNTER — Ambulatory Visit (INDEPENDENT_AMBULATORY_CARE_PROVIDER_SITE_OTHER): Payer: Medicare Other

## 2014-05-07 DIAGNOSIS — Z23 Encounter for immunization: Secondary | ICD-10-CM

## 2014-05-23 ENCOUNTER — Other Ambulatory Visit: Payer: Self-pay | Admitting: Internal Medicine

## 2014-06-01 ENCOUNTER — Other Ambulatory Visit: Payer: Self-pay

## 2014-06-01 MED ORDER — ALPRAZOLAM 0.25 MG PO TABS: 0.2500 mg | ORAL_TABLET | Freq: Three times a day (TID) | ORAL | Status: DC | PRN

## 2014-06-01 NOTE — Telephone Encounter (Signed)
Done hardcopy to robin  

## 2014-06-02 NOTE — Telephone Encounter (Signed)
Faxed hardcopy for Alprazolam to [facility]

## 2014-07-12 ENCOUNTER — Other Ambulatory Visit: Payer: Self-pay | Admitting: Internal Medicine

## 2014-08-31 ENCOUNTER — Encounter: Payer: Self-pay | Admitting: Gastroenterology

## 2014-10-11 ENCOUNTER — Other Ambulatory Visit: Payer: Self-pay | Admitting: Internal Medicine

## 2014-11-02 ENCOUNTER — Encounter: Payer: Medicare Other | Admitting: Internal Medicine

## 2014-11-02 DIAGNOSIS — Z0289 Encounter for other administrative examinations: Secondary | ICD-10-CM

## 2014-11-16 ENCOUNTER — Other Ambulatory Visit: Payer: Self-pay | Admitting: Internal Medicine

## 2014-12-07 ENCOUNTER — Other Ambulatory Visit: Payer: Self-pay | Admitting: Internal Medicine

## 2014-12-20 ENCOUNTER — Other Ambulatory Visit: Payer: Self-pay | Admitting: Internal Medicine

## 2015-01-17 ENCOUNTER — Other Ambulatory Visit: Payer: Self-pay | Admitting: Internal Medicine

## 2015-01-18 ENCOUNTER — Ambulatory Visit (INDEPENDENT_AMBULATORY_CARE_PROVIDER_SITE_OTHER): Payer: Medicare Other | Admitting: Emergency Medicine

## 2015-01-18 VITALS — BP 128/86 | HR 105 | Temp 99.0°F | Resp 20 | Ht 61.25 in | Wt 130.4 lb

## 2015-01-18 DIAGNOSIS — J441 Chronic obstructive pulmonary disease with (acute) exacerbation: Secondary | ICD-10-CM | POA: Diagnosis not present

## 2015-01-18 DIAGNOSIS — R0602 Shortness of breath: Secondary | ICD-10-CM | POA: Diagnosis not present

## 2015-01-18 MED ORDER — ALBUTEROL SULFATE (2.5 MG/3ML) 0.083% IN NEBU
5.0000 mg | INHALATION_SOLUTION | Freq: Once | RESPIRATORY_TRACT | Status: AC
  Administered 2015-01-18: 5 mg via RESPIRATORY_TRACT

## 2015-01-18 MED ORDER — LEVOFLOXACIN 500 MG PO TABS: 500.0000 mg | ORAL_TABLET | Freq: Every day | ORAL | Status: AC

## 2015-01-18 MED ORDER — ALBUTEROL SULFATE HFA 108 (90 BASE) MCG/ACT IN AERS: 2.0000 | INHALATION_SPRAY | RESPIRATORY_TRACT | Status: DC | PRN

## 2015-01-18 MED ORDER — IPRATROPIUM BROMIDE 0.02 % IN SOLN
0.5000 mg | Freq: Once | RESPIRATORY_TRACT | Status: AC
  Administered 2015-01-18: 0.5 mg via RESPIRATORY_TRACT

## 2015-01-18 NOTE — Patient Instructions (Signed)

## 2015-01-18 NOTE — Progress Notes (Signed)
Subjective:  Patient ID: Andrea Larson, female    DOB: December 14, 1945  Age: 69 y.o. MRN: [id number]  CC: Cough and Medication Refill   HPI CHIYO FAY presents  with a history of COPD. He uses inhalers at home. She's experienced shortness of breath and wheezing over the last month. She has a cough productive of mucopurulent sputum. She also has a mucopurulent nasal discharge. She has initially had a fever. But that has resolved. No chills. No nausea vomiting or stool change. No rash. She's had no improvement with over-the-counter medication. Despite using her normal maintenance inhaler she's had increasing shortness of breath and wheezing  History Cesar has a past medical history of Allergic rhinitis; Hyperlipidemia; COPD (chronic obstructive pulmonary disease); Asthma; Arthritis; UTI (lower urinary tract infection); Colon polyps; Asthma (09/21/2011); and Allergic rhinitis, cause unspecified (09/21/2011).   She has past surgical history that includes Tonsillectomy.   Her  family history includes Alcohol abuse in her other; Arthritis in her other; Heart disease in her other; Hypertension in her other; Stroke in her other.  She   reports that she quit smoking about 3 years ago. Her smoking use included Cigarettes. She has never used smokeless tobacco. She reports that she drinks alcohol. She reports that she does not use illicit drugs.  Outpatient Prescriptions Prior to Visit  Medication Sig Dispense Refill  . ALPRAZolam (XANAX) 0.25 MG tablet Take 1 tablet (0.25 mg total) by mouth 3 (three) times daily as needed. 90 tablet 5  . aspirin 81 MG tablet Take 81 mg by mouth daily.    . CRESTOR 20 MG tablet TAKE 1 TABLET ONCE DAILY. 30 tablet 11  . FLUoxetine (PROZAC) 20 MG capsule TAKE (1) CAPSULE DAILY. 90 capsule 3  . montelukast (SINGULAIR) 10 MG tablet TAKE 1 TABLET ONCE DAILY. 90 tablet 3  . SYMBICORT 160-4.5 MCG/ACT inhaler USE 2 INHALATIONS TWICE DAILY 10.2 g 11  . Tiotropium  Bromide Monohydrate (SPIRIVA RESPIMAT) 2.5 MCG/ACT AERS Inhale 1 puff into the lungs daily as needed. 12 g 3  . PROAIR HFA 108 (90 BASE) MCG/ACT inhaler USE 2 PUFFS EVERY 6 HOURS AS NEEDED FOR WHEEZING. 8.5 g 0  . atorvastatin (LIPITOR) 10 MG tablet Take 1 tablet (10 mg total) by mouth daily. 90 tablet 3  . Calcium Carbonate-Vitamin D (CALTRATE 600+D) 600-400 MG-UNIT per tablet Take 1 tablet by mouth 2 (two) times daily.      . citalopram (CELEXA) 20 MG tablet Take 1 tablet (20 mg total) by mouth daily. 30 tablet 5  . FLUoxetine (PROZAC) 40 MG capsule Take 1 capsule (40 mg total) by mouth daily. 30 capsule 11  . HYDROcodone-acetaminophen (NORCO) 10-325 MG per tablet Take 1 tablet by mouth every 6 (six) hours as needed. With limit 4 tabs per day  - to fill Sep 27, 2013 (Patient not taking: Reported on 01/18/2015) 120 tablet 0  . traMADol (ULTRAM) 50 MG tablet 1 tab qid to prevent cough (Patient not taking: Reported on 01/18/2015) 100 tablet 2  . ZETIA 10 MG tablet TAKE 1 TABLET ONCE DAILY. (Patient not taking: Reported on 01/18/2015) 90 tablet 3   No facility-administered medications prior to visit.    History   Social History  . Marital Status: Divorced    Spouse Name: N/A  . Number of Children: N/A  . Years of Education: N/A   Social History Main Topics  . Smoking status: Former Smoker    Types: Cigarettes    Quit date: 02/18/2011  .  Smokeless tobacco: Never Used  . Alcohol Use: Yes     Comment: occ.   . Drug Use: No  . Sexual Activity: Not on file   Other Topics Concern  . None   Social History Narrative     Review of Systems  Constitutional: Negative for fever, chills and appetite change.  HENT: Positive for congestion, postnasal drip, rhinorrhea and sinus pressure. Negative for ear pain and sore throat.   Eyes: Negative for pain and redness.  Respiratory: Positive for cough, shortness of breath and wheezing.   Cardiovascular: Negative for leg swelling.  Gastrointestinal:  Negative for nausea, vomiting, abdominal pain, diarrhea, constipation and blood in stool.  Endocrine: Negative for polyuria.  Genitourinary: Negative for dysuria, urgency, frequency and flank pain.  Musculoskeletal: Negative for gait problem.  Skin: Negative for rash.  Neurological: Negative for weakness and headaches.  Psychiatric/Behavioral: Negative for confusion and decreased concentration. The patient is not nervous/anxious.     Objective:  BP 128/86 mmHg  Pulse 105  Temp(Src) 99 F (37.2 C) (Oral)  Resp 20  Ht 5' 1.25" (1.556 m)  Wt 130 lb 6.4 oz (59.149 kg)  BMI 24.43 kg/m2  SpO2 94%  Physical Exam  Constitutional: She is oriented to person, place, and time. She appears well-developed and well-nourished. No distress.  HENT:  Head: Normocephalic and atraumatic.  Right Ear: External ear normal.  Left Ear: External ear normal.  Nose: Nose normal.  Eyes: Conjunctivae and EOM are normal. Pupils are equal, round, and reactive to light. No scleral icterus.  Neck: Normal range of motion. Neck supple. No tracheal deviation present.  Cardiovascular: Normal rate, regular rhythm and normal heart sounds.   Pulmonary/Chest: Effort normal. No respiratory distress. She has wheezes. She has no rales.  Abdominal: She exhibits no mass. There is no tenderness. There is no rebound and no guarding.  Musculoskeletal: She exhibits no edema.  Lymphadenopathy:    She has no cervical adenopathy.  Neurological: She is alert and oriented to person, place, and time. Coordination normal.  Skin: Skin is warm and dry. No rash noted.  Psychiatric: She has a normal mood and affect. Her behavior is normal.      Assessment & Plan:   Alyra was seen today for cough and medication refill.  Diagnoses and all orders for this visit:  Shortness of breath Orders: -     albuterol (PROVENTIL) (2.5 MG/3ML) 0.083% nebulizer solution 5 mg; Take 6 mLs (5 mg total) by nebulization once. -     ipratropium  (ATROVENT) nebulizer solution 0.5 mg; Take 2.5 mLs (0.5 mg total) by nebulization once.  COPD exacerbation  Other orders -     albuterol (PROAIR HFA) 108 (90 BASE) MCG/ACT inhaler; Inhale 2 puffs into the lungs every 4 (four) hours as needed for wheezing or shortness of breath. -     levofloxacin (LEVAQUIN) 500 MG tablet; Take 1 tablet (500 mg total) by mouth daily.  I have changed Ms. Enyeart's PROAIR HFA to albuterol. I am also having her start on levofloxacin. Additionally, I am having her maintain her Calcium Carbonate-Vitamin D, citalopram, traMADol, FLUoxetine, atorvastatin, aspirin, HYDROcodone-acetaminophen, ZETIA, FLUoxetine, montelukast, ALPRAZolam, CRESTOR, SYMBICORT, and Tiotropium Bromide Monohydrate. We administered albuterol and ipratropium.  Meds ordered this encounter  Medications  . albuterol (PROVENTIL) (2.5 MG/3ML) 0.083% nebulizer solution 5 mg    Sig:   . ipratropium (ATROVENT) nebulizer solution 0.5 mg    Sig:   . albuterol (PROAIR HFA) 108 (90 BASE) MCG/ACT inhaler  Sig: Inhale 2 puffs into the lungs every 4 (four) hours as needed for wheezing or shortness of breath.    Dispense:  18 g    Refill:  12    Appointment is required for any further refills  . levofloxacin (LEVAQUIN) 500 MG tablet    Sig: Take 1 tablet (500 mg total) by mouth daily.    Dispense:  10 tablet    Refill:  0    Appropriate red flag conditions were discussed with the patient as well as actions that should be taken.  Patient expressed his understanding.  Follow-up: Return if symptoms worsen or fail to improve.  Roselee Culver, MD

## 2015-01-28 ENCOUNTER — Telehealth: Payer: Self-pay | Admitting: Internal Medicine

## 2015-01-28 MED ORDER — FLUOXETINE HCL 40 MG PO CAPS: 40.0000 mg | ORAL_CAPSULE | Freq: Every day | ORAL | Status: DC

## 2015-01-28 MED ORDER — ALBUTEROL SULFATE HFA 108 (90 BASE) MCG/ACT IN AERS: 2.0000 | INHALATION_SPRAY | RESPIRATORY_TRACT | Status: DC | PRN

## 2015-01-28 NOTE — Telephone Encounter (Signed)
Patient missed her cpe in April.  Patient states she did not get the automated call.  I have worked her in for August.  Patient states she will be out of proair and fluoxetine.  She is requesting scripts to be sent to [facility].

## 2015-02-15 ENCOUNTER — Other Ambulatory Visit (INDEPENDENT_AMBULATORY_CARE_PROVIDER_SITE_OTHER): Payer: Medicare Other

## 2015-02-15 ENCOUNTER — Encounter: Payer: Self-pay | Admitting: Internal Medicine

## 2015-02-15 ENCOUNTER — Ambulatory Visit (INDEPENDENT_AMBULATORY_CARE_PROVIDER_SITE_OTHER): Payer: Medicare Other | Admitting: Internal Medicine

## 2015-02-15 VITALS — BP 122/80 | HR 138 | Temp 98.1°F | Ht 61.0 in | Wt 129.0 lb

## 2015-02-15 DIAGNOSIS — J441 Chronic obstructive pulmonary disease with (acute) exacerbation: Secondary | ICD-10-CM | POA: Diagnosis not present

## 2015-02-15 DIAGNOSIS — J209 Acute bronchitis, unspecified: Secondary | ICD-10-CM | POA: Insufficient documentation

## 2015-02-15 DIAGNOSIS — R739 Hyperglycemia, unspecified: Secondary | ICD-10-CM

## 2015-02-15 DIAGNOSIS — Z0000 Encounter for general adult medical examination without abnormal findings: Secondary | ICD-10-CM

## 2015-02-15 DIAGNOSIS — R000 Tachycardia, unspecified: Secondary | ICD-10-CM | POA: Diagnosis not present

## 2015-02-15 LAB — URINALYSIS, ROUTINE W REFLEX MICROSCOPIC
Ketones, ur: 15 — AB
Leukocytes, UA: NEGATIVE
Nitrite: NEGATIVE
Specific Gravity, Urine: 1.025 (ref 1.000–1.030)
Urine Glucose: NEGATIVE
Urobilinogen, UA: 0.2 (ref 0.0–1.0)
pH: 5.5 (ref 5.0–8.0)

## 2015-02-15 LAB — CBC WITH DIFFERENTIAL/PLATELET
Basophils Absolute: 0.1 10*3/uL (ref 0.0–0.1)
Basophils Relative: 1 % (ref 0.0–3.0)
Eosinophils Absolute: 0.1 10*3/uL (ref 0.0–0.7)
Eosinophils Relative: 1 % (ref 0.0–5.0)
HCT: 42.2 % (ref 36.0–46.0)
Hemoglobin: 14.2 g/dL (ref 12.0–15.0)
Lymphocytes Relative: 24 % (ref 12.0–46.0)
Lymphs Abs: 1.8 10*3/uL (ref 0.7–4.0)
MCHC: 33.6 g/dL (ref 30.0–36.0)
MCV: 95.8 fl (ref 78.0–100.0)
Monocytes Absolute: 0.7 10*3/uL (ref 0.1–1.0)
Monocytes Relative: 9.7 % (ref 3.0–12.0)
Neutro Abs: 4.8 10*3/uL (ref 1.4–7.7)
Neutrophils Relative %: 64.3 % (ref 43.0–77.0)
Platelets: 375 10*3/uL (ref 150.0–400.0)
RBC: 4.41 Mil/uL (ref 3.87–5.11)
RDW: 13 % (ref 11.5–15.5)
WBC: 7.4 10*3/uL (ref 4.0–10.5)

## 2015-02-15 LAB — BASIC METABOLIC PANEL
BUN: 19 mg/dL (ref 6–23)
CO2: 24 mEq/L (ref 19–32)
Calcium: 9.3 mg/dL (ref 8.4–10.5)
Chloride: 101 mEq/L (ref 96–112)
Creatinine, Ser: 1 mg/dL (ref 0.40–1.20)
GFR: 58.01 mL/min — ABNORMAL LOW (ref >60.00)
Glucose, Bld: 94 mg/dL (ref 70–99)
Potassium: 3.9 mEq/L (ref 3.5–5.1)
Sodium: 134 mEq/L — ABNORMAL LOW (ref 135–145)

## 2015-02-15 LAB — HEPATIC FUNCTION PANEL
ALT: 12 U/L (ref 0–35)
AST: 19 U/L (ref 0–37)
Albumin: 4 g/dL (ref 3.5–5.2)
Alkaline Phosphatase: 54 U/L (ref 39–117)
Bilirubin, Direct: 0.1 mg/dL (ref 0.0–0.3)
Total Bilirubin: 0.5 mg/dL (ref 0.2–1.2)
Total Protein: 7.2 g/dL (ref 6.0–8.3)

## 2015-02-15 LAB — LIPID PANEL
Cholesterol: 150 mg/dL (ref 0–200)
HDL: 62.7 mg/dL (ref >39.00)
LDL Cholesterol: 68 mg/dL (ref 0–99)
NonHDL: 87.79
Total CHOL/HDL Ratio: 2
Triglycerides: 97 mg/dL (ref 0.0–149.0)
VLDL: 19.4 mg/dL (ref 0.0–40.0)

## 2015-02-15 LAB — TSH: TSH: 1.08 u[IU]/mL (ref 0.35–4.50)

## 2015-02-15 LAB — HEMOGLOBIN A1C: Hgb A1c MFr Bld: 5.2 % (ref 4.6–6.5)

## 2015-02-15 MED ORDER — ALBUTEROL SULFATE HFA 108 (90 BASE) MCG/ACT IN AERS: 2.0000 | INHALATION_SPRAY | RESPIRATORY_TRACT | Status: DC | PRN

## 2015-02-15 MED ORDER — BUDESONIDE-FORMOTEROL FUMARATE 160-4.5 MCG/ACT IN AERO: 2.0000 | INHALATION_SPRAY | Freq: Two times a day (BID) | RESPIRATORY_TRACT | Status: DC

## 2015-02-15 MED ORDER — FLUOXETINE HCL 40 MG PO CAPS: 40.0000 mg | ORAL_CAPSULE | Freq: Every day | ORAL | Status: DC

## 2015-02-15 MED ORDER — LEVOFLOXACIN 250 MG PO TABS: 250.0000 mg | ORAL_TABLET | Freq: Every day | ORAL | Status: DC

## 2015-02-15 MED ORDER — PREDNISONE 10 MG PO TABS: ORAL_TABLET | ORAL | Status: DC

## 2015-02-15 MED ORDER — METHYLPREDNISOLONE ACETATE 80 MG/ML IJ SUSP
80.0000 mg | Freq: Once | INTRAMUSCULAR | Status: AC
  Administered 2015-02-15: 80 mg via INTRAMUSCULAR

## 2015-02-15 MED ORDER — HYDROCODONE-HOMATROPINE 5-1.5 MG/5ML PO SYRP: 5.0000 mL | ORAL_SOLUTION | Freq: Four times a day (QID) | ORAL | Status: DC | PRN

## 2015-02-15 MED ORDER — TIOTROPIUM BROMIDE MONOHYDRATE 2.5 MCG/ACT IN AERS: 1.0000 | INHALATION_SPRAY | Freq: Every day | RESPIRATORY_TRACT | Status: DC | PRN

## 2015-02-15 MED ORDER — MONTELUKAST SODIUM 10 MG PO TABS: ORAL_TABLET | ORAL | Status: DC

## 2015-02-15 MED ORDER — ROSUVASTATIN CALCIUM 20 MG PO TABS: 20.0000 mg | ORAL_TABLET | Freq: Every day | ORAL | Status: DC

## 2015-02-15 NOTE — Addendum Note (Signed)
Addended by: Lyman Bishop on: 02/15/2015 04:23 PM   Modules accepted: Orders

## 2015-02-15 NOTE — Assessment & Plan Note (Signed)
SR by ecg, likely related to current illness,  to f/u any worsening symptoms or concerns, also check tsh with labs

## 2015-02-15 NOTE — Patient Instructions (Addendum)
You had the steroid shot today  Please take all new medication as prescribed - the antibiotic, cough medicine, and prednisone  Please continue all other medications as before, and refills have been done if requested.  Please have the pharmacy call with any other refills you may need.  Please continue your efforts at being more active, low cholesterol diet, and weight control.  You are otherwise up to date with prevention measures today.  Please keep your appointments with your specialists as you may have planned  You will be contacted regarding the referral for: colonoscopy  Please follow up with your mammogram as you indicated.   Please go to the LAB in the Basement (turn left off the elevator) for the tests to be done today  You will be contacted by phone if any changes need to be made immediately.  Otherwise, you will receive a letter about your results with an explanation, but please check with MyChart first.  Please remember to sign up for MyChart if you have not done so, as this will be important to you in the future with finding out test results, communicating by private email, and scheduling acute appointments online when needed.  Please return in 6 months, or sooner if needed

## 2015-02-15 NOTE — Progress Notes (Signed)
Subjective:    Patient ID: Andrea Larson, female    DOB: 1946-04-03, 69 y.o.   MRN: [id number]  HPI  Here for wellness and f/u;  Overall doing ok;  Pt denies Chest pain, orthopnea, PND, worsening LE edema, palpitations, dizziness or syncope.  Pt denies neurological change such as new headache, facial or extremity weakness.  Pt denies polydipsia, polyuria, or low sugar symptoms. Pt states overall good compliance with treatment and medications, good tolerability, and has been trying to follow appropriate diet.  Pt denies worsening depressive symptoms, suicidal ideation or panic. No fever, night sweats, wt loss, loss of appetite, or other constitutional symptoms.  Pt states good ability with ADL's, has low fall risk, home safety reviewed and adequate, no other significant changes in hearing or vision, and only occasionally active with exercise. Did have URi tx with antibx per UC, still with cough, then last wk with more prod cough, congestion, ST, + wheezing (using inhaler more often), not sure about fever.  Neb tx at the UC helped as well. Feels overall worse today again than when seen in UC. Has cut back on smoking with illness, and plans to try to quit.   Now willing to have her overdue colonsocpy, needs mammogram. Past Medical History  Diagnosis Date  . Allergic rhinitis   . Hyperlipidemia   . COPD (chronic obstructive pulmonary disease)     mild   . Asthma   . Arthritis   . UTI (lower urinary tract infection)   . Colon polyps   . Asthma 09/21/2011  . Allergic rhinitis, cause unspecified 09/21/2011   Past Surgical History  Procedure Laterality Date  . Tonsillectomy      reports that she has been smoking Cigarettes.  She has never used smokeless tobacco. She reports that she drinks alcohol. She reports that she does not use illicit drugs. family history includes Alcohol abuse in her other; Arthritis in her other; Heart disease in her other; Hypertension in her other; Stroke in her  other. Allergies  Allergen Reactions  . Lipitor [Atorvastatin]     myalgias  . Sulfa Antibiotics    Current Outpatient Prescriptions on File Prior to Visit  Medication Sig Dispense Refill  . ALPRAZolam (XANAX) 0.25 MG tablet Take 1 tablet (0.25 mg total) by mouth 3 (three) times daily as needed. 90 tablet 5  . aspirin 81 MG tablet Take 81 mg by mouth daily.    Marland Kitchen atorvastatin (LIPITOR) 10 MG tablet Take 1 tablet (10 mg total) by mouth daily. 90 tablet 3  . Calcium Carbonate-Vitamin D (CALTRATE 600+D) 600-400 MG-UNIT per tablet Take 1 tablet by mouth 2 (two) times daily.      . citalopram (CELEXA) 20 MG tablet Take 1 tablet (20 mg total) by mouth daily. 30 tablet 5  . HYDROcodone-acetaminophen (NORCO) 10-325 MG per tablet Take 1 tablet by mouth every 6 (six) hours as needed. With limit 4 tabs per day  - to fill Sep 27, 2013 (Patient not taking: Reported on 01/18/2015) 120 tablet 0  . traMADol (ULTRAM) 50 MG tablet 1 tab qid to prevent cough (Patient not taking: Reported on 01/18/2015) 100 tablet 2  . ZETIA 10 MG tablet TAKE 1 TABLET ONCE DAILY. (Patient not taking: Reported on 01/18/2015) 90 tablet 3   No current facility-administered medications on file prior to visit.   Review of Systems Constitutional: Negative for increased diaphoresis, other activity, appetite or siginficant weight change other than noted HENT: Negative for worsening hearing loss,  ear pain, facial swelling, mouth sores and neck stiffness.   Eyes: Negative for other worsening pain, redness or visual disturbance.  Respiratory: Negative for shortness of breath and wheezing  Cardiovascular: Negative for chest pain and palpitations.  Gastrointestinal: Negative for diarrhea, blood in stool, abdominal distention or other pain Genitourinary: Negative for hematuria, flank pain or change in urine volume.  Musculoskeletal: Negative for myalgias or other joint complaints.  Skin: Negative for color change and wound or drainage.   Neurological: Negative for syncope and numbness. other than noted Hematological: Negative for adenopathy. or other swelling Psychiatric/Behavioral: Negative for hallucinations, SI, self-injury, decreased concentration or other worsening agitation.      Objective:   Physical Exam BP 122/80 mmHg  Pulse 138  Temp(Src) 98.1 F (36.7 C) (Oral)  Ht 5\' 1"  (1.549 m)  Wt 129 lb (58.514 kg)  BMI 24.39 kg/m2  SpO2 96% VS noted, mild ill Constitutional: Pt is oriented to person, place, and time. Appears well-developed and well-nourished, in no significant distress Head: Normocephalic and atraumatic.  Right Ear: External ear normal.  Left Ear: External ear normal.  Nose: Nose normal.  Mouth/Throat: Oropharynx is clear and moist.  Eyes: Conjunctivae and EOM are normal. Pupils are equal, round, and reactive to light.  Bilat tm's with mild erythema.  Max sinus areas mild tender.  Pharynx with mild erythema, no exudate Neck: Normal range of motion. Neck supple. No JVD present. No tracheal deviation present or significant neck LA or mass Cardiovascular: Normal rate, regular rhythm, normal heart sounds and intact distal pulses.   Pulmonary/Chest: Effort normal and breath soundsdecreased without rales but mild bilat wheezing noted Abdominal: Soft. Bowel sounds are normal. NT. No HSM  Musculoskeletal: Normal range of motion. Exhibits no edema.  Lymphadenopathy:  Has no cervical adenopathy.  Neurological: Pt is alert and oriented to person, place, and time. Pt has normal reflexes. No cranial nerve deficit. Motor grossly intact Skin: Skin is warm and dry. No rash noted.  Psychiatric:  Has normal mood and affect. Behavior is normal.     Assessment & Plan:

## 2015-02-15 NOTE — Assessment & Plan Note (Signed)
Mild to mod, for antibx course,  Cough med prn,to f/u any worsening symptoms or concerns 

## 2015-02-15 NOTE — Assessment & Plan Note (Signed)
Mild to mod, for contd inhaler use, as well as steroid IM, predpac asd,  to f/u any worsening symptoms or concerns SpO2 Readings from Last 3 Encounters:  02/15/15 96%  01/18/15 94%  10/28/13 97%

## 2015-02-15 NOTE — Progress Notes (Signed)
Pre visit review using our clinic review tool, if applicable. No additional management support is needed unless otherwise documented below in the visit note. 

## 2015-02-15 NOTE — Assessment & Plan Note (Signed)

## 2015-02-16 ENCOUNTER — Encounter: Payer: Self-pay | Admitting: Gastroenterology

## 2015-03-01 ENCOUNTER — Encounter: Payer: Self-pay | Admitting: Internal Medicine

## 2015-03-01 ENCOUNTER — Ambulatory Visit (INDEPENDENT_AMBULATORY_CARE_PROVIDER_SITE_OTHER): Payer: Medicare Other | Admitting: Internal Medicine

## 2015-03-01 VITALS — BP 150/92 | HR 114 | Temp 98.8°F | Ht 61.0 in | Wt 130.0 lb

## 2015-03-01 DIAGNOSIS — J209 Acute bronchitis, unspecified: Secondary | ICD-10-CM

## 2015-03-01 DIAGNOSIS — J441 Chronic obstructive pulmonary disease with (acute) exacerbation: Secondary | ICD-10-CM | POA: Diagnosis not present

## 2015-03-01 DIAGNOSIS — J309 Allergic rhinitis, unspecified: Secondary | ICD-10-CM | POA: Diagnosis not present

## 2015-03-01 DIAGNOSIS — R7302 Impaired glucose tolerance (oral): Secondary | ICD-10-CM | POA: Diagnosis not present

## 2015-03-01 MED ORDER — METHYLPREDNISOLONE ACETATE 80 MG/ML IJ SUSP
80.0000 mg | Freq: Once | INTRAMUSCULAR | Status: AC
  Administered 2015-03-01: 80 mg via INTRAMUSCULAR

## 2015-03-01 MED ORDER — PREDNISONE 10 MG PO TABS: ORAL_TABLET | ORAL | Status: DC

## 2015-03-01 MED ORDER — FEXOFENADINE HCL 180 MG PO TABS: 180.0000 mg | ORAL_TABLET | Freq: Every day | ORAL | Status: DC

## 2015-03-01 MED ORDER — HYDROCODONE-HOMATROPINE 5-1.5 MG/5ML PO SYRP: 5.0000 mL | ORAL_SOLUTION | Freq: Four times a day (QID) | ORAL | Status: DC | PRN

## 2015-03-01 NOTE — Patient Instructions (Addendum)
You had the steroid shot today  Please take all new medication as prescribed - the prednisone (higher dose) and allegra  Please continue all other medications as before, and refills have been done if requested such as the cough medicine  Please have the pharmacy call with any other refills you may need.  Please keep your appointments with your specialists as you may have planned  Please go to the XRAY Department in the Basement (go straight as you get off the elevator) for the x-ray testing tomorrow if possible  You will be contacted by phone if any changes need to be made immediately.  Otherwise, you will receive a letter about your results with an explanation, but please check with MyChart first.  Please remember to sign up for MyChart if you have not done so, as this will be important to you in the future with finding out test results, communicating by private email, and scheduling acute appointments online when needed.

## 2015-03-01 NOTE — Assessment & Plan Note (Signed)
Resolved, will hold on furhter antibx, for cxr to clarify , consider further antbx if infiltrate

## 2015-03-01 NOTE — Assessment & Plan Note (Signed)
Ok to add the allegra prn,  to f/u any worsening symptoms or concerns

## 2015-03-01 NOTE — Progress Notes (Signed)
Subjective:    Patient ID: Andrea Larson, female    DOB: 09/15/1945, 69 y.o.   MRN: [id number]  HPI  Here to f/u, did quite nicely with last tx for copd exac/acute bronchitis with levaquin, predpac, cough med with near resolution of symptoms at 10 days, but now worse again in last 5 days without fever, white sputum, but scratchy throat, wheezing/sob/doe and scant prod cough.  Does have several wks ongoing nasal allergy symptoms with clearish congestion, itch and sneezing, without fever, pain, ST, cough, swelling, asks for allegra in addition to singulair.  Still smoking - plans to try quit with nicotine patch. Pt denies chest pain, orthopnea, PND, increased LE swelling, palpitations, dizziness or syncope. Past Medical History  Diagnosis Date  . Allergic rhinitis   . Hyperlipidemia   . COPD (chronic obstructive pulmonary disease)     mild   . Asthma   . Arthritis   . UTI (lower urinary tract infection)   . Colon polyps   . Asthma 09/21/2011  . Allergic rhinitis, cause unspecified 09/21/2011   Past Surgical History  Procedure Laterality Date  . Tonsillectomy      reports that she has been smoking Cigarettes.  She has never used smokeless tobacco. She reports that she drinks alcohol. She reports that she does not use illicit drugs. family history includes Alcohol abuse in her other; Arthritis in her other; Heart disease in her other; Hypertension in her other; Stroke in her other. Allergies  Allergen Reactions  . Lipitor [Atorvastatin]     myalgias  . Sulfa Antibiotics    Current Outpatient Prescriptions on File Prior to Visit  Medication Sig Dispense Refill  . albuterol (PROAIR HFA) 108 (90 BASE) MCG/ACT inhaler Inhale 2 puffs into the lungs every 4 (four) hours as needed for wheezing or shortness of breath. 18 g 3  . ALPRAZolam (XANAX) 0.25 MG tablet Take 1 tablet (0.25 mg total) by mouth 3 (three) times daily as needed. 90 tablet 5  . aspirin 81 MG tablet Take 81 mg by mouth  daily.    . budesonide-formoterol (SYMBICORT) 160-4.5 MCG/ACT inhaler Inhale 2 puffs into the lungs 2 (two) times daily. 10.2 g 11  . Calcium Carbonate-Vitamin D (CALTRATE 600+D) 600-400 MG-UNIT per tablet Take 1 tablet by mouth 2 (two) times daily.      Marland Kitchen HYDROcodone-acetaminophen (NORCO) 10-325 MG per tablet Take 1 tablet by mouth every 6 (six) hours as needed. With limit 4 tabs per day  - to fill Sep 27, 2013 120 tablet 0  . HYDROcodone-homatropine (HYCODAN) 5-1.5 MG/5ML syrup Take 5 mLs by mouth every 6 (six) hours as needed for cough. 180 mL 0  . montelukast (SINGULAIR) 10 MG tablet TAKE 1 TABLET ONCE DAILY. 90 tablet 3  . predniSONE (DELTASONE) 10 MG tablet 3 tabs by mouth per day for 3 days,2tabs per day for 3 days,1tab per day for 3 days 18 tablet 0  . rosuvastatin (CRESTOR) 20 MG tablet Take 1 tablet (20 mg total) by mouth daily. 30 tablet 11  . Tiotropium Bromide Monohydrate (SPIRIVA RESPIMAT) 2.5 MCG/ACT AERS Inhale 1 puff into the lungs daily as needed. 12 g 3  . traMADol (ULTRAM) 50 MG tablet 1 tab qid to prevent cough 100 tablet 2  . ZETIA 10 MG tablet TAKE 1 TABLET ONCE DAILY. 90 tablet 3  . atorvastatin (LIPITOR) 10 MG tablet Take 1 tablet (10 mg total) by mouth daily. 90 tablet 3  . citalopram (CELEXA) 20 MG tablet  Take 1 tablet (20 mg total) by mouth daily. 30 tablet 5  . FLUoxetine (PROZAC) 40 MG capsule Take 1 capsule (40 mg total) by mouth daily. (Patient not taking: Reported on 03/01/2015) 30 capsule 1   No current facility-administered medications on file prior to visit.   Review of Systems  Constitutional: Negative for unusual diaphoresis or night sweats HENT: Negative for ringing in ear or discharge Eyes: Negative for double vision or worsening visual disturbance.  Respiratory: Negative for choking and stridor.   Gastrointestinal: Negative for vomiting or other signifcant bowel change Genitourinary: Negative for hematuria or change in urine volume.  Musculoskeletal:  Negative for other MSK pain or swelling Skin: Negative for color change and worsening wound.  Neurological: Negative for tremors and numbness other than noted  Psychiatric/Behavioral: Negative for decreased concentration or agitation other than above       Objective:   Physical Exam BP 150/92 mmHg  Pulse 114  Temp(Src) 98.8 F (37.1 C) (Oral)  Ht 5\' 1"  (1.549 m)  Wt 130 lb (58.968 kg)  BMI 24.58 kg/m2  SpO2 96%  VS noted, not ill appearing Constitutional: Pt appears in no significant distress HENT: Head: NCAT.  Right Ear: External ear normal.  Left Ear: External ear normal.  Eyes: . Pupils are equal, round, and reactive to light. Conjunctivae and EOM are normal Neck: Normal range of motion. Neck supple.  Cardiovascular: Normal rate and regular rhythm.   Pulmonary/Chest: Effort normal and breath sounds decreased with bilat wheezes and several rhonchi Neurological: Pt is alert. Not confused , motor grossly intact Skin: Skin is warm. No rash, no LE edema Psychiatric: Pt behavior is normal. No agitation.      Assessment & Plan:

## 2015-03-01 NOTE — Assessment & Plan Note (Signed)
stable overall by history and exam, recent data reviewed with pt, and pt to continue medical treatment as before,  to f/u any worsening symptoms or concerns Lab Results  Component Value Date   HGBA1C 5.2 02/15/2015

## 2015-03-01 NOTE — Addendum Note (Signed)
Addended by: Lyman Bishop on: 03/01/2015 06:46 PM   Modules accepted: Orders

## 2015-03-01 NOTE — Assessment & Plan Note (Signed)
Better, then worse symptomatically again, for dpeomedrol, predpac asd, cough med prn,  to f/u any worsening symptoms or concerns, cont inhalers as prescrbied including the symbicort

## 2015-03-01 NOTE — Progress Notes (Signed)
Pre visit review using our clinic review tool, if applicable. No additional management support is needed unless otherwise documented below in the visit note. 

## 2015-03-02 ENCOUNTER — Encounter: Payer: Self-pay | Admitting: Internal Medicine

## 2015-03-02 ENCOUNTER — Ambulatory Visit (INDEPENDENT_AMBULATORY_CARE_PROVIDER_SITE_OTHER)
Admission: RE | Admit: 2015-03-02 | Discharge: 2015-03-02 | Disposition: A | Discharge status: Home / Self Care | Payer: Medicare Other | Source: Ambulatory Visit | Attending: Internal Medicine | Admitting: Internal Medicine

## 2015-03-02 DIAGNOSIS — J209 Acute bronchitis, unspecified: Secondary | ICD-10-CM | POA: Diagnosis not present

## 2015-03-31 ENCOUNTER — Other Ambulatory Visit: Payer: Self-pay | Admitting: Internal Medicine

## 2015-04-06 ENCOUNTER — Ambulatory Visit (AMBULATORY_SURGERY_CENTER): Payer: Self-pay

## 2015-04-06 VITALS — Ht 61.0 in | Wt 129.6 lb

## 2015-04-06 DIAGNOSIS — Z860100 Personal history of colon polyps, unspecified: Secondary | ICD-10-CM

## 2015-04-06 NOTE — Progress Notes (Signed)
No allergies to eggs or soy No diet/weight loss meds No home oxygen No past problems with anesthesia  Refused emmi 

## 2015-04-15 ENCOUNTER — Other Ambulatory Visit: Payer: Self-pay | Admitting: Internal Medicine

## 2015-04-20 ENCOUNTER — Ambulatory Visit (AMBULATORY_SURGERY_CENTER): Payer: Medicare Other | Admitting: Gastroenterology

## 2015-04-20 ENCOUNTER — Encounter: Payer: Self-pay | Admitting: Gastroenterology

## 2015-04-20 VITALS — BP 153/84 | HR 94 | Temp 97.0°F | Resp 38 | Ht 61.0 in | Wt 129.0 lb

## 2015-04-20 DIAGNOSIS — D123 Benign neoplasm of transverse colon: Secondary | ICD-10-CM

## 2015-04-20 DIAGNOSIS — Z86010 Personal history of colonic polyps: Secondary | ICD-10-CM

## 2015-04-20 DIAGNOSIS — K635 Polyp of colon: Secondary | ICD-10-CM | POA: Diagnosis not present

## 2015-04-20 DIAGNOSIS — D120 Benign neoplasm of cecum: Secondary | ICD-10-CM | POA: Diagnosis not present

## 2015-04-20 MED ORDER — SODIUM CHLORIDE 0.9 % IV SOLN: 500.0000 mL | INTRAVENOUS | Status: DC

## 2015-04-20 NOTE — Op Note (Signed)
[facility]  [employer]. [place], [address]   COLONOSCOPY PROCEDURE REPORT  PATIENT: Andrea, Larson  MR#: [id number] BIRTHDATE: Aug 23, 1945 , 69  yrs. old GENDER: female ENDOSCOPIST: Ladene Artist, MD, [facility] PROCEDURE DATE:  04/20/2015 PROCEDURE:   Colonoscopy, surveillance , Colonoscopy with biopsy, and Colonoscopy with snare polypectomy First Screening Colonoscopy - Avg.  risk and is 50 yrs.  old or older - No.  Prior Negative Screening - Now for repeat screening. N/A  History of Adenoma - Now for follow-up colonoscopy & has been > or = to 3 yrs.  Yes hx of adenoma.  Has been 3 or more years since last colonoscopy.  Polyps removed today? Yes ASA CLASS:   Class III INDICATIONS:Surveillance due to prior colonic neoplasia and PH Colon Adenoma. MEDICATIONS: Monitored anesthesia care and Propofol 325 mg IV DESCRIPTION OF PROCEDURE:   After the risks benefits and alternatives of the procedure were thoroughly explained, informed consent was obtained.  The digital rectal exam revealed no abnormalities of the rectum.   The LB [id number]-H190 [id number]  endoscope was introduced through the anus and advanced to the cecum, which was identified by both the appendix and ileocecal valve. No adverse events experienced.   The quality of the prep was excellent. (MiraLax was used)  The instrument was then slowly withdrawn as the colon was fully examined. Estimated blood loss is zero unless otherwise noted in this procedure report.  COLON FINDINGS: Two sessile polyps measuring 4 mm in size were found at the cecum.  Polypectomies were performed with cold forceps.  The resection was complete, the polyp tissue was completely retrieved and sent to histology.   A sessile polyp measuring 6 mm in size was found in the transverse colon.  A polypectomy was performed with a cold snare.  The resection was complete, the polyp tissue was completely retrieved and sent to histology.  There was  moderate diverticulosis noted in the sigmoid colon with associated tortuosity, angulation, muscular hypertrophy and luminal narrowing. The examination was otherwise normal.  Retroflexed views revealed no abnormalities. The time to cecum = 7.4 Withdrawal time = 9.0 The scope was withdrawn and the procedure completed. COMPLICATIONS: There were no immediate complications.  ENDOSCOPIC IMPRESSION: 1.   Two sessile polyps at the cecum; polypectomies performed with cold forceps 2.   Sessile polyp in the transverse colon; polypectomy performed with a cold snare 3.   Moderate diverticulosis in the sigmoid colon  RECOMMENDATIONS: 1.  Hold Aspirin and all other NSAIDS for 2 weeks. 2.  Await pathology results 3.  High fiber diet with liberal fluid intake. 4.  Repeat Colonoscopy in 5 years.  eSigned:  Ladene Artist, MD, [facility] 04/20/2015 2:22 PM

## 2015-04-20 NOTE — Progress Notes (Signed)
Transferred to recovery room. A/O x3, pleased with MAC.  VSS.  Report to Wendy, RN. 

## 2015-04-20 NOTE — Progress Notes (Signed)
Called to room to assist during endoscopic procedure.  Patient ID and intended procedure confirmed with present staff. Received instructions for my participation in the procedure from the performing physician.  

## 2015-04-20 NOTE — Patient Instructions (Signed)

## 2015-04-21 ENCOUNTER — Telehealth: Payer: Self-pay | Admitting: *Deleted

## 2015-04-21 NOTE — Telephone Encounter (Signed)
  Follow up Call-  Call back number 04/20/2015  Post procedure Call Back phone  # [phone]  Permission to leave phone message Yes     Patient questions:  Do you have a fever, pain , or abdominal swelling? No. Pain Score  0 *  Have you tolerated food without any problems? Yes.    Have you been able to return to your normal activities? Yes.    Do you have any questions about your discharge instructions: Diet   No. Medications  No. Follow up visit  No.  Do you have questions or concerns about your Care? No.  Actions: * If pain score is 4 or above: No action needed, pain <4.

## 2015-04-25 ENCOUNTER — Encounter: Payer: Self-pay | Admitting: Gastroenterology

## 2015-05-27 ENCOUNTER — Other Ambulatory Visit: Payer: Self-pay | Admitting: Internal Medicine

## 2015-08-18 ENCOUNTER — Other Ambulatory Visit (INDEPENDENT_AMBULATORY_CARE_PROVIDER_SITE_OTHER): Payer: Medicare Other

## 2015-08-18 ENCOUNTER — Encounter: Payer: Self-pay | Admitting: Internal Medicine

## 2015-08-18 ENCOUNTER — Telehealth: Payer: Self-pay | Admitting: Internal Medicine

## 2015-08-18 ENCOUNTER — Ambulatory Visit (INDEPENDENT_AMBULATORY_CARE_PROVIDER_SITE_OTHER): Payer: Medicare Other | Admitting: Internal Medicine

## 2015-08-18 VITALS — BP 144/86 | HR 112 | Temp 98.1°F | Ht 61.0 in | Wt 128.0 lb

## 2015-08-18 DIAGNOSIS — K219 Gastro-esophageal reflux disease without esophagitis: Secondary | ICD-10-CM | POA: Diagnosis not present

## 2015-08-18 DIAGNOSIS — G4700 Insomnia, unspecified: Secondary | ICD-10-CM | POA: Diagnosis not present

## 2015-08-18 DIAGNOSIS — Z0000 Encounter for general adult medical examination without abnormal findings: Secondary | ICD-10-CM

## 2015-08-18 DIAGNOSIS — E785 Hyperlipidemia, unspecified: Secondary | ICD-10-CM

## 2015-08-18 DIAGNOSIS — F341 Dysthymic disorder: Secondary | ICD-10-CM

## 2015-08-18 DIAGNOSIS — J449 Chronic obstructive pulmonary disease, unspecified: Secondary | ICD-10-CM | POA: Diagnosis not present

## 2015-08-18 LAB — URINALYSIS, ROUTINE W REFLEX MICROSCOPIC
Ketones, ur: NEGATIVE
Nitrite: POSITIVE — AB
Specific Gravity, Urine: 1.025 (ref 1.000–1.030)
Urine Glucose: NEGATIVE
Urobilinogen, UA: 0.2 (ref 0.0–1.0)
pH: 5.5 (ref 5.0–8.0)

## 2015-08-18 LAB — CBC WITH DIFFERENTIAL/PLATELET
Basophils Absolute: 0.1 10*3/uL (ref 0.0–0.1)
Basophils Relative: 0.6 % (ref 0.0–3.0)
Eosinophils Absolute: 0.2 10*3/uL (ref 0.0–0.7)
Eosinophils Relative: 1.4 % (ref 0.0–5.0)
HCT: 43.5 % (ref 36.0–46.0)
Hemoglobin: 14.3 g/dL (ref 12.0–15.0)
Lymphocytes Relative: 16.7 % (ref 12.0–46.0)
Lymphs Abs: 1.8 10*3/uL (ref 0.7–4.0)
MCHC: 32.9 g/dL (ref 30.0–36.0)
MCV: 97.6 fl (ref 78.0–100.0)
Monocytes Absolute: 1 10*3/uL (ref 0.1–1.0)
Monocytes Relative: 9 % (ref 3.0–12.0)
Neutro Abs: 7.8 10*3/uL — ABNORMAL HIGH (ref 1.4–7.7)
Neutrophils Relative %: 72.3 % (ref 43.0–77.0)
Platelets: 353 10*3/uL (ref 150.0–400.0)
RBC: 4.45 Mil/uL (ref 3.87–5.11)
RDW: 13.8 % (ref 11.5–15.5)
WBC: 10.8 10*3/uL — ABNORMAL HIGH (ref 4.0–10.5)

## 2015-08-18 MED ORDER — FLUOXETINE HCL 40 MG PO CAPS: 40.0000 mg | ORAL_CAPSULE | Freq: Every day | ORAL | Status: DC

## 2015-08-18 MED ORDER — PANTOPRAZOLE SODIUM 40 MG PO TBEC: 40.0000 mg | DELAYED_RELEASE_TABLET | Freq: Every day | ORAL | Status: DC

## 2015-08-18 MED ORDER — ALPRAZOLAM 0.25 MG PO TABS: 0.2500 mg | ORAL_TABLET | Freq: Two times a day (BID) | ORAL | Status: DC | PRN

## 2015-08-18 MED ORDER — CLONAZEPAM 1 MG PO TABS
1.0000 mg | ORAL_TABLET | Freq: Every evening | ORAL | Status: DC | PRN
Start: 2015-08-18 — End: 2016-01-19

## 2015-08-18 NOTE — Assessment & Plan Note (Signed)
For clonazepan qhs, may need pulm referral r/o osa if not improved

## 2015-08-18 NOTE — Assessment & Plan Note (Signed)
Mild to mod, for PPI,  to f/u any worsening symptoms or concerns 

## 2015-08-18 NOTE — Assessment & Plan Note (Signed)
stable overall by history and exam, recent data reviewed with pt, and pt to continue medical treatment as before,  to f/u any worsening symptoms or concerns Lab Results  Component Value Date   WBC 7.4 02/15/2015   HGB 14.2 02/15/2015   HCT 42.2 02/15/2015   PLT 375.0 02/15/2015   GLUCOSE 94 02/15/2015   CHOL 150 02/15/2015   TRIG 97.0 02/15/2015   HDL 62.70 02/15/2015   LDLDIRECT 124.8 09/21/2011   LDLCALC 68 02/15/2015   ALT 12 02/15/2015   AST 19 02/15/2015   NA 134* 02/15/2015   K 3.9 02/15/2015   CL 101 02/15/2015   CREATININE 1.00 02/15/2015   BUN 19 02/15/2015   CO2 24 02/15/2015   TSH 1.08 02/15/2015   HGBA1C 5.2 02/15/2015

## 2015-08-18 NOTE — Progress Notes (Signed)
Subjective:    Patient ID: Andrea Larson, female    DOB: 1945/10/28, 70 y.o.   MRN: [id number]  HPI  Here for wellness and f/u;  Overall doing ok;  Pt denies Chest pain, worsening SOB, DOE, wheezing, orthopnea, PND, worsening LE edema, palpitations, dizziness or syncope.  Pt denies neurological change such as new headache, facial or extremity weakness.  Pt denies polydipsia, polyuria, or low sugar symptoms. Pt states overall good compliance with treatment and medications, good tolerability, and has been trying to follow appropriate diet.  Pt denies worsening depressive symptoms, suicidal ideation or panic. No fever, night sweats, wt loss, loss of appetite, or other constitutional symptoms.  Pt states good ability with ADL's, has low fall risk, home safety reviewed and adequate, no other significant changes in hearing or vision, and only occasionally active with exercise Completely quit smoking oct 10; Does have worsening reflux recently with nausea, sob, but no abd pain, dysphagia, vomiting, bowel change or blood.  Does have significant trouble getting to sleep and seems to jerk all night with the extremities per housemate with her today  Does also have occas right neck pain with radiation to the right arm, but very infreq and mild, no neuro changes Past Medical History  Diagnosis Date  . Allergic rhinitis   . Hyperlipidemia   . COPD (chronic obstructive pulmonary disease) (HCC)     mild   . Asthma   . Arthritis   . UTI (lower urinary tract infection)   . Colon polyps   . Asthma 09/21/2011  . Allergic rhinitis, cause unspecified 09/21/2011   Past Surgical History  Procedure Laterality Date  . Tonsillectomy    . Pilonidial cyst    . Dental surgery      reports that she quit smoking about 4 months ago. Her smoking use included Cigarettes. She has never used smokeless tobacco. She reports that she drinks alcohol. She reports that she does not use illicit drugs. family history includes  Alcohol abuse in her other; Arthritis in her other; Heart disease in her other; Hypertension in her other; Stroke in her other. There is no history of Colon cancer. Allergies  Allergen Reactions  . Lipitor [Atorvastatin]     myalgias  . Sulfa Antibiotics    Current Outpatient Prescriptions on File Prior to Visit  Medication Sig Dispense Refill  . ALPRAZolam (XANAX) 0.25 MG tablet Take 1 tablet (0.25 mg total) by mouth 3 (three) times daily as needed. 90 tablet 5  . aspirin 81 MG tablet Take 81 mg by mouth daily.    . budesonide-formoterol (SYMBICORT) 160-4.5 MCG/ACT inhaler Inhale 2 puffs into the lungs 2 (two) times daily. 10.2 g 11  . fexofenadine (ALLEGRA) 180 MG tablet Take 1 tablet (180 mg total) by mouth daily. 30 tablet 11  . FLUoxetine (PROZAC) 40 MG capsule TAKE 1 CAPSULE DAILY. 30 capsule 5  . PROAIR HFA 108 (90 BASE) MCG/ACT inhaler USE 2 PUFFS EVERY 4 HOURS AS NEEDED FOR WHEEZE OR SHORTNESS OF BREATH. 8.5 g 0  . rosuvastatin (CRESTOR) 20 MG tablet Take 1 tablet (20 mg total) by mouth daily. 30 tablet 11  . Tiotropium Bromide Monohydrate (SPIRIVA RESPIMAT) 2.5 MCG/ACT AERS Inhale 1 puff into the lungs daily as needed. 12 g 3  . atorvastatin (LIPITOR) 10 MG tablet Take 1 tablet (10 mg total) by mouth daily. 90 tablet 3  . citalopram (CELEXA) 20 MG tablet Take 1 tablet (20 mg total) by mouth daily. 30 tablet 5  .  HYDROcodone-acetaminophen (NORCO) 10-325 MG per tablet Take 1 tablet by mouth every 6 (six) hours as needed. With limit 4 tabs per day  - to fill Sep 27, 2013 (Patient not taking: Reported on 04/20/2015) 120 tablet 0  . HYDROcodone-homatropine (HYCODAN) 5-1.5 MG/5ML syrup Take 5 mLs by mouth every 6 (six) hours as needed for cough. (Patient not taking: Reported on 04/20/2015) 180 mL 0  . montelukast (SINGULAIR) 10 MG tablet TAKE 1 TABLET ONCE DAILY. (Patient not taking: Reported on 08/18/2015) 90 tablet 3   No current facility-administered medications on file prior to visit.     Review of Systems Constitutional: Negative for increased diaphoresis, other activity, appetite or siginficant weight change other than noted HENT: Negative for worsening hearing loss, ear pain, facial swelling, mouth sores and neck stiffness.   Eyes: Negative for other worsening pain, redness or visual disturbance.  Respiratory: Negative for shortness of breath and wheezing  Cardiovascular: Negative for chest pain and palpitations.  Gastrointestinal: Negative for diarrhea, blood in stool, abdominal distention or other pain Genitourinary: Negative for hematuria, flank pain or change in urine volume.  Musculoskeletal: Negative for myalgias or other joint complaints.  Skin: Negative for color change and wound or drainage.  Neurological: Negative for syncope and numbness. other than noted Hematological: Negative for adenopathy. or other swelling Psychiatric/Behavioral: Negative for hallucinations, SI, self-injury, decreased concentration or other worsening agitation.      Objective:   Physical Exam BP 144/86 mmHg  Pulse 112  Temp(Src) 98.1 F (36.7 C) (Oral)  Ht 5\' 1"  (1.549 m)  Wt 128 lb (58.06 kg)  BMI 24.20 kg/m2  SpO2 95% VS noted,  Constitutional: Pt is oriented to person, place, and time. Appears well-developed and well-nourished, in no significant distress Head: Normocephalic and atraumatic.  Right Ear: External ear normal.  Left Ear: External ear normal.  Nose: Nose normal.  Mouth/Throat: Oropharynx is clear and moist.  Eyes: Conjunctivae and EOM are normal. Pupils are equal, round, and reactive to light.  Neck: Normal range of motion. Neck supple. No JVD present. No tracheal deviation present or significant neck LA or mass Cardiovascular: Normal rate, regular rhythm, normal heart sounds and intact distal pulses.   Pulmonary/Chest: Effort normal and breath sounds without rales or wheezing  Abdominal: Soft. Bowel sounds are normal. NT. No HSM  Musculoskeletal: Normal  range of motion. Exhibits no edema.  Lymphadenopathy:  Has no cervical adenopathy.  Neurological: Pt is alert and oriented to person, place, and time. Pt has normal reflexes. No cranial nerve deficit. Motor grossly intact Skin: Skin is warm and dry. No rash noted.  Psychiatric:  Has nervous 2+ mood and affect. Behavior is normal.     Assessment & Plan:

## 2015-08-18 NOTE — Telephone Encounter (Signed)
Patient has a date of service 11/02/2014 $50.00 no show fee. Patient states she called billing and billing had originally told her that they were going to take care of it but patient has recieved another bill.  Can you please remove fee for patient.

## 2015-08-18 NOTE — Assessment & Plan Note (Signed)

## 2015-08-18 NOTE — Patient Instructions (Addendum)
Please take all new medication as prescribed - the nighttime clonazepam for sleep and jerking at night, and the protonix for reflux  Please continue all other medications as before, and refills have been done if requested - including the Xanax BUT we had to decrease this to twice per day as you would take the clonazepam at night (they are similar medications)  Please have the pharmacy call with any other refills you may need.  Please continue your efforts at being more active, low cholesterol diet, and weight control.  You are otherwise up to date with prevention measures today.  Please keep your appointments with your specialists as you may have planned  Please go to the LAB in the Basement (turn left off the elevator) for the tests to be done today  You will be contacted by phone if any changes need to be made immediately.  Otherwise, you will receive a letter about your results with an explanation, but please check with MyChart first.  Please remember to sign up for MyChart if you have not done so, as this will be important to you in the future with finding out test results, communicating by private email, and scheduling acute appointments online when needed.  Please return in 6 months, or sooner if needed, with Lab testing done 3-5 days before

## 2015-08-18 NOTE — Assessment & Plan Note (Signed)
stable overall by history and exam, recent data reviewed with pt, and pt to continue medical treatment as before,  to f/u any worsening symptoms or concerns  SpO2 Readings from Last 3 Encounters:  08/18/15 95%  04/20/15 97%  03/01/15 96%

## 2015-08-19 ENCOUNTER — Other Ambulatory Visit: Payer: Self-pay | Admitting: Internal Medicine

## 2015-08-19 ENCOUNTER — Encounter: Payer: Self-pay | Admitting: Internal Medicine

## 2015-08-19 LAB — BASIC METABOLIC PANEL
BUN: 23 mg/dL (ref 6–23)
CO2: 27 mEq/L (ref 19–32)
Calcium: 10.1 mg/dL (ref 8.4–10.5)
Chloride: 103 mEq/L (ref 96–112)
Creatinine, Ser: 1.41 mg/dL — ABNORMAL HIGH (ref 0.40–1.20)
GFR: 38.97 mL/min — ABNORMAL LOW (ref >60.00)
Glucose, Bld: 98 mg/dL (ref 70–99)
Potassium: 4.7 mEq/L (ref 3.5–5.1)
Sodium: 139 mEq/L (ref 135–145)

## 2015-08-19 LAB — HEPATIC FUNCTION PANEL
ALT: 16 U/L (ref 0–35)
AST: 23 U/L (ref 0–37)
Albumin: 4.3 g/dL (ref 3.5–5.2)
Alkaline Phosphatase: 55 U/L (ref 39–117)
Bilirubin, Direct: 0.2 mg/dL (ref 0.0–0.3)
Total Bilirubin: 0.5 mg/dL (ref 0.2–1.2)
Total Protein: 7.6 g/dL (ref 6.0–8.3)

## 2015-08-19 LAB — LIPID PANEL
Cholesterol: 222 mg/dL — ABNORMAL HIGH (ref 0–200)
HDL: 87.6 mg/dL (ref >39.00)
LDL Cholesterol: 109 mg/dL — ABNORMAL HIGH (ref 0–99)
NonHDL: 134.67
Total CHOL/HDL Ratio: 3
Triglycerides: 127 mg/dL (ref 0.0–149.0)
VLDL: 25.4 mg/dL (ref 0.0–40.0)

## 2015-08-19 LAB — HEPATITIS C ANTIBODY: HCV Ab: NEGATIVE

## 2015-08-19 LAB — TSH: TSH: 1.18 u[IU]/mL (ref 0.35–4.50)

## 2015-08-19 MED ORDER — LEVOFLOXACIN 250 MG PO TABS: 250.0000 mg | ORAL_TABLET | Freq: Every day | ORAL | Status: DC

## 2015-08-19 NOTE — Telephone Encounter (Signed)
The no show fee for 11/02/14 was voided. Please inform patient the $56.70 balance she is being billed for is from Dr. Lenoard Aden office. She will need to contact billing or Dr. Lynne Leader office for clarification.

## 2015-08-22 NOTE — Telephone Encounter (Signed)
Informed patient

## 2015-08-31 ENCOUNTER — Encounter: Payer: Self-pay | Admitting: Gastroenterology

## 2015-09-26 ENCOUNTER — Other Ambulatory Visit: Payer: Self-pay | Admitting: Internal Medicine

## 2015-10-20 ENCOUNTER — Other Ambulatory Visit: Payer: Self-pay | Admitting: Internal Medicine

## 2015-12-18 ENCOUNTER — Emergency Department (HOSPITAL_COMMUNITY)
Admission: EM | Admit: 2015-12-18 | Discharge: 2015-12-19 | Disposition: A | Discharge status: Home / Self Care | Payer: Medicare Other | Attending: Emergency Medicine | Admitting: Emergency Medicine

## 2015-12-18 ENCOUNTER — Encounter (HOSPITAL_COMMUNITY): Payer: Self-pay | Admitting: Nurse Practitioner

## 2015-12-18 ENCOUNTER — Emergency Department (HOSPITAL_COMMUNITY): Payer: Medicare Other

## 2015-12-18 DIAGNOSIS — S81812A Laceration without foreign body, left lower leg, initial encounter: Secondary | ICD-10-CM | POA: Insufficient documentation

## 2015-12-18 DIAGNOSIS — J449 Chronic obstructive pulmonary disease, unspecified: Secondary | ICD-10-CM | POA: Diagnosis not present

## 2015-12-18 DIAGNOSIS — Z87891 Personal history of nicotine dependence: Secondary | ICD-10-CM | POA: Insufficient documentation

## 2015-12-18 DIAGNOSIS — S0181XA Laceration without foreign body of other part of head, initial encounter: Secondary | ICD-10-CM | POA: Insufficient documentation

## 2015-12-18 DIAGNOSIS — Y999 Unspecified external cause status: Secondary | ICD-10-CM | POA: Insufficient documentation

## 2015-12-18 DIAGNOSIS — Y92828 Other wilderness area as the place of occurrence of the external cause: Secondary | ICD-10-CM | POA: Diagnosis not present

## 2015-12-18 DIAGNOSIS — Z7982 Long term (current) use of aspirin: Secondary | ICD-10-CM | POA: Insufficient documentation

## 2015-12-18 DIAGNOSIS — W01198A Fall on same level from slipping, tripping and stumbling with subsequent striking against other object, initial encounter: Secondary | ICD-10-CM | POA: Diagnosis not present

## 2015-12-18 DIAGNOSIS — M1990 Unspecified osteoarthritis, unspecified site: Secondary | ICD-10-CM | POA: Diagnosis not present

## 2015-12-18 DIAGNOSIS — Z79899 Other long term (current) drug therapy: Secondary | ICD-10-CM | POA: Diagnosis not present

## 2015-12-18 DIAGNOSIS — J45909 Unspecified asthma, uncomplicated: Secondary | ICD-10-CM | POA: Diagnosis not present

## 2015-12-18 DIAGNOSIS — E785 Hyperlipidemia, unspecified: Secondary | ICD-10-CM | POA: Insufficient documentation

## 2015-12-18 DIAGNOSIS — S0990XA Unspecified injury of head, initial encounter: Secondary | ICD-10-CM | POA: Diagnosis present

## 2015-12-18 DIAGNOSIS — Y9301 Activity, walking, marching and hiking: Secondary | ICD-10-CM | POA: Diagnosis not present

## 2015-12-18 DIAGNOSIS — W19XXXA Unspecified fall, initial encounter: Secondary | ICD-10-CM

## 2015-12-18 DIAGNOSIS — S0191XA Laceration without foreign body of unspecified part of head, initial encounter: Secondary | ICD-10-CM

## 2015-12-18 MED ORDER — LIDOCAINE-EPINEPHRINE (PF) 1 %-1:200000 IJ SOLN
INTRAMUSCULAR | Status: AC
  Administered 2015-12-19: 30 mL
  Filled 2015-12-18: qty 30

## 2015-12-18 MED ORDER — LIDOCAINE-EPINEPHRINE (PF) 2 %-1:200000 IJ SOLN: 20.0000 mL | Freq: Once | INTRAMUSCULAR | Status: DC

## 2015-12-18 MED ORDER — LIDOCAINE HCL (PF) 1 % IJ SOLN: 30.0000 mL | Freq: Once | INTRAMUSCULAR | Status: DC

## 2015-12-18 MED ORDER — TETANUS-DIPHTH-ACELL PERTUSSIS 5-2.5-18.5 LF-MCG/0.5 IM SUSP
0.5000 mL | Freq: Once | INTRAMUSCULAR | Status: AC
  Administered 2015-12-18: 0.5 mL via INTRAMUSCULAR
  Filled 2015-12-18: qty 0.5

## 2015-12-18 MED ORDER — BACITRACIN ZINC 500 UNIT/GM EX OINT
TOPICAL_OINTMENT | Freq: Two times a day (BID) | CUTANEOUS | Status: DC
  Administered 2015-12-19: 1 via TOPICAL
  Filled 2015-12-18: qty 2.7

## 2015-12-18 NOTE — ED Notes (Signed)
Bed: [id number] Expected date:  Expected time:  Means of arrival:  Comments: EMS 70yo F fall / head lac

## 2015-12-18 NOTE — ED Notes (Signed)
Pt reports a fall on her drive way, impact to the head with a mild laceration, also laceration with controlled bleeding to her left knee. Endorses alcohol involvement, denies pain and being on anticoagulants.

## 2015-12-18 NOTE — ED Provider Notes (Signed)
CSN: [id number]     Arrival date & time 12/18/15  2025 History   First MD Initiated Contact with Patient 12/18/15 2054     No chief complaint on file.  (Consider location/radiation/quality/duration/timing/severity/associated sxs/prior Treatment) The history is provided by the patient and medical records. No language interpreter was used.    Andrea Larson is a 70 y.o. female  with a PMH of COPD, HLD who presents to the Emergency Department after a mechanical fall just PTA. Patient was walking up the hill in her driveway when she tripped and fell forward, hitting her head and right knee. She is unsure of LOC. Admits to laceration of right knee, but denies any knee pain or decreased ROM. Denies headache, neck pain, nausea, vomiting. On 81mg  ASA, no anticoagulants. Tetanus status unknown.   Past Medical History  Diagnosis Date  . Allergic rhinitis   . Hyperlipidemia   . COPD (chronic obstructive pulmonary disease) (HCC)     mild   . Asthma   . Arthritis   . UTI (lower urinary tract infection)   . Colon polyps   . Asthma 09/21/2011  . Allergic rhinitis, cause unspecified 09/21/2011   Past Surgical History  Procedure Laterality Date  . Tonsillectomy    . Pilonidial cyst    . Dental surgery     Family History  Problem Relation Age of Onset  . Alcohol abuse Other   . Arthritis Other   . Heart disease Other   . Stroke Other   . Hypertension Other   . Colon cancer Neg Hx    Social History  Substance Use Topics  . Smoking status: Former Smoker    Types: Cigarettes    Quit date: 04/18/2015  . Smokeless tobacco: Never Used  . Alcohol Use: 0.0 oz/week    0 Standard drinks or equivalent per week     Comment: occ.    OB History    No data available     Review of Systems  Constitutional: Negative for fever.  HENT: Negative for congestion.   Eyes: Negative for visual disturbance.  Respiratory: Negative for cough and shortness of breath.   Cardiovascular: Negative for chest  pain.  Gastrointestinal: Negative for nausea, vomiting and abdominal pain.  Musculoskeletal: Negative for arthralgias.  Skin: Positive for wound.  Allergic/Immunologic: Negative for immunocompromised state.  Neurological: Negative for numbness and headaches.      Allergies  Lipitor and Sulfa antibiotics  Home Medications   Prior to Admission medications   Medication Sig Start Date End Date Taking? Authorizing Provider  ALPRAZolam (XANAX) 0.25 MG tablet Take 1 tablet (0.25 mg total) by mouth 2 (two) times daily as needed. 08/18/15   Biagio Borg, MD  aspirin 81 MG tablet Take 81 mg by mouth daily.    Historical Provider, MD  atorvastatin (LIPITOR) 10 MG tablet Take 1 tablet (10 mg total) by mouth daily. 10/18/11 10/17/12  Biagio Borg, MD  clonazePAM (KLONOPIN) 1 MG tablet Take 1 tablet (1 mg total) by mouth at bedtime as needed (for PLMD). Ok to take with the xanax in daytime 08/18/15   Biagio Borg, MD  fexofenadine (ALLEGRA) 180 MG tablet Take 1 tablet (180 mg total) by mouth daily. 03/01/15   Biagio Borg, MD  FLUoxetine (PROZAC) 40 MG capsule Take 1 capsule (40 mg total) by mouth daily. 08/18/15   Biagio Borg, MD  levofloxacin (LEVAQUIN) 250 MG tablet Take 1 tablet (250 mg total) by mouth daily. 08/19/15  Biagio Borg, MD  pantoprazole (PROTONIX) 40 MG tablet Take 1 tablet (40 mg total) by mouth daily. 08/18/15   Biagio Borg, MD  PROAIR HFA 108 (2 BASE) MCG/ACT inhaler USE 2 PUFFS EVERY 4 HOURS AS NEEDED FOR WHEEZE OR SHORTNESS OF BREATH. 04/15/15   Biagio Borg, MD  rosuvastatin (CRESTOR) 20 MG tablet TAKE 1 TABLET ONCE DAILY. 10/20/15   Biagio Borg, MD  SYMBICORT 160-4.5 MCG/ACT inhaler USE 2 INHALATIONS TWICE DAILY 10/20/15   Biagio Borg, MD  Tiotropium Bromide Monohydrate (SPIRIVA RESPIMAT) 2.5 MCG/ACT AERS Inhale 1 puff into the lungs daily as needed. 02/15/15   Biagio Borg, MD   BP 109/59 mmHg  Pulse 100  Temp(Src) 98.7 F (37.1 C) (Oral)  Resp 18  SpO2 93% Physical Exam   Constitutional: She is oriented to person, place, and time. She appears well-developed and well-nourished.  Alert and in no acute distress  HENT:  Head: Normocephalic. Head is without raccoon's eyes and without Battle's sign.  Right Ear: No hemotympanum.  Left Ear: No hemotympanum.  0.5 cm laceration of right temporal area.   Neck:  Full ROM without pain, no midline tenderness.   Cardiovascular: Normal rate, regular rhythm, normal heart sounds and intact distal pulses.  Exam reveals no gallop and no friction rub.   No murmur heard. Pulmonary/Chest: Effort normal and breath sounds normal. No respiratory distress. She has no wheezes. She has no rales. She exhibits no tenderness.  Abdominal: Soft. She exhibits no distension and no mass. There is no tenderness. There is no rebound and no guarding.  Musculoskeletal:  Left knee: Knee with full ROM. No joint line or bony TTP. There is no joint effusion or swelling appreciated. No abnormal alignment or patellar mobility. No bruising, erythema, or warmth overlaying the joint. + lac. No varus/valgus laxity. Negative drawer's, negative Lachman's, negative McMurray's, no crepitus.  2+ DP pulses bilaterally. All compartments are soft. Sensation intact distal to injury.  Neurological: She is alert and oriented to person, place, and time.  Alert, oriented, thought content appropriate, able to give a coherent history. Speech is clear and goal oriented, able to follow commands.  Cranial Nerves:  II:  Peripheral visual fields grossly normal, pupils equal, round, reactive to light III, IV, VI: EOM intact bilaterally, ptosis not present V,VII: smile symmetric, eyes kept closed tightly against resistance, facial light touch sensation equal VIII: hearing grossly normal IX, X: symmetric soft palate movement, uvula elevates symmetrically  XI: bilateral shoulder shrug symmetric and strong XII: midline tongue extension 5/5 muscle strength in upper and lower  extremities bilaterally including strong and equal grip strength and dorsiflexion/plantar flexion Sensory to light touch normal in all four extremities.  Normal finger-to-nose and rapid alternating movements; normal gait and balance.  Skin: Skin is warm and dry.  5 cm laceration / skin flap of left knee.  Nursing note and vitals reviewed.   ED Course  Procedures (including critical care time)  LACERATION REPAIR Performed by: Ozella Almond Kadija Cruzen Authorized by: Ozella Almond Klaryssa Fauth Consent: Verbal consent obtained. Risks and benefits: risks, benefits and alternatives were discussed Consent given by: patient Patient identity confirmed: provided demographic data Prepped and Draped in normal sterile fashion Wound explored Laceration Location: left knee Laceration Length: 5.5 cm No Foreign Bodies seen or palpated Anesthesia: local infiltration Local anesthetic: lidocaine 1% with epinephrine Anesthetic total: 10 ml Irrigation method: syringe Amount of cleaning: standard Skin closure: 5-0 Nylon Number of sutures: 8 Technique: Simple interrupted  Patient tolerance: Patient tolerated the procedure well with no immediate complications.  LACERATION REPAIR Performed by: Ozella Almond Caylie Sandquist Authorized by: Ozella Almond Cartez Mogle Consent: Verbal consent obtained. Risks and benefits: risks, benefits and alternatives were discussed Consent given by: patient Patient identity confirmed: provided demographic data Prepped and Draped in normal sterile fashion Wound explored Laceration Location: Right temporal area, head Laceration Length: 1cm No Foreign Bodies seen or palpated Anesthesia: none Irrigation method: syringe Amount of cleaning: standard Skin closure: Steri-strips, 1/4 Number: 2  Patient tolerance: Patient tolerated the procedure well with no immediate complications.  Labs Review Labs Reviewed - No data to display  Imaging Review No results found. I have personally reviewed and  evaluated these images and lab results as part of my medical decision-making.   EKG Interpretation None      MDM   Final diagnoses:  None   Andrea Larson presents with mechanical fall. Hit head, on 81mg  ASA. No focal neuro deficits on exam. CT head and neck reassuring. Lacerations repaired as dictated above. Patient counseled on home wound care. Follow up with PCP/urgent care or return to ER for suture removal in 7 days. Patient was urged to return to the Emergency Department for worsening pain, swelling, expanding erythema especially if it streaks away from the affected area, fever, or for any additional concerns. Patient verbalized understanding. All questions answered.   Patient discussed with Dr. Audie Pinto who agrees with treatment plan.    [facility] Layza Summa, PA-C 12/19/15 0004  Leonard Schwartz, MD 12/19/15 [id number]

## 2015-12-19 MED ORDER — BACITRACIN ZINC 500 UNIT/GM EX OINT: 1.0000 "application " | TOPICAL_OINTMENT | Freq: Two times a day (BID) | CUTANEOUS | Status: DC

## 2015-12-19 MED ORDER — HYDROCODONE-ACETAMINOPHEN 5-325 MG PO TABS: 1.0000 | ORAL_TABLET | Freq: Four times a day (QID) | ORAL | Status: DC | PRN

## 2015-12-19 NOTE — Discharge Instructions (Signed)
Keep wound clean with mild soap and water. Keep area covered with a topical antibiotic ointment and bandage, keep bandage dry, and do not submerge in water for 24 hours. Ice and elevate for additional pain relief and swelling. Take pain medication only as needed for severe pain - This can make you very drowsy - please do not drink alcohol, operate heavy machinery or drive on this medication.  Follow up with your primary care doctor or the [facility] [place] in approximately 7 days for wound recheck and suture removal. Monitor area for signs of infection to include, but not limited to: increasing pain, spreading redness, drainage/pus, worsening swelling, or fevers. Return to emergency department for emergent changing or worsening symptoms.   WOUND CARE  Keep area clean and dry for 24 hours. Do not remove bandage, if applied.  After 24 hours,you should change it at least once a day. Also, change the dressing if it becomes wet or dirty, or as directed by your caregiver.   Wash the wound with soap and water 2 times a day. Rinse the wound off with water to remove all soap. Pat the wound dry with a clean towel.   You may shower as usual after the first 24 hours. Do not soak the wound in water until the sutures are removed.   Once the wound has healed, scarring can be minimized by covering the wound with sunscreen during the day for 1 full year.  Do not apply any ointments or creams to the wound while stitches/staples are in place, as this may cause delayed healing.  Return if you experience any of the following signs of infection: Swelling, redness, pus drainage, streaking, fever >101.0 F  Return if you experience excessive bleeding that does not stop after 15-20 minutes of constant, firm pressure.

## 2016-01-19 ENCOUNTER — Other Ambulatory Visit: Payer: Self-pay | Admitting: Internal Medicine

## 2016-01-19 NOTE — Telephone Encounter (Signed)
Medication refill sent to pharmacy  

## 2016-01-19 NOTE — Telephone Encounter (Signed)
Done hardcopy to Corinne  

## 2016-02-28 ENCOUNTER — Other Ambulatory Visit: Payer: Self-pay | Admitting: Internal Medicine

## 2016-02-28 NOTE — Telephone Encounter (Signed)
Faxed script back to [place].../lmb 

## 2016-02-28 NOTE — Telephone Encounter (Signed)
Done hardcopy to Corinne  

## 2016-03-16 ENCOUNTER — Other Ambulatory Visit: Payer: Self-pay | Admitting: Internal Medicine

## 2016-03-28 ENCOUNTER — Encounter: Payer: Self-pay | Admitting: Internal Medicine

## 2016-04-02 ENCOUNTER — Other Ambulatory Visit: Payer: Self-pay | Admitting: Internal Medicine

## 2016-04-17 ENCOUNTER — Other Ambulatory Visit: Payer: Self-pay | Admitting: Internal Medicine

## 2016-04-30 ENCOUNTER — Other Ambulatory Visit: Payer: Self-pay | Admitting: Internal Medicine

## 2016-05-21 ENCOUNTER — Other Ambulatory Visit: Payer: Self-pay | Admitting: Internal Medicine

## 2016-05-29 ENCOUNTER — Other Ambulatory Visit: Payer: Self-pay | Admitting: Internal Medicine

## 2016-06-17 ENCOUNTER — Emergency Department (HOSPITAL_COMMUNITY)
Admission: EM | Admit: 2016-06-17 | Discharge: 2016-06-18 | Disposition: A | Discharge status: Home / Self Care | Payer: Medicare Other | Attending: Emergency Medicine | Admitting: Emergency Medicine

## 2016-06-17 ENCOUNTER — Emergency Department (HOSPITAL_COMMUNITY): Payer: Medicare Other

## 2016-06-17 ENCOUNTER — Encounter (HOSPITAL_COMMUNITY): Payer: Self-pay

## 2016-06-17 DIAGNOSIS — F10920 Alcohol use, unspecified with intoxication, uncomplicated: Secondary | ICD-10-CM

## 2016-06-17 DIAGNOSIS — F10220 Alcohol dependence with intoxication, uncomplicated: Secondary | ICD-10-CM | POA: Diagnosis present

## 2016-06-17 DIAGNOSIS — Z87891 Personal history of nicotine dependence: Secondary | ICD-10-CM | POA: Diagnosis not present

## 2016-06-17 DIAGNOSIS — J441 Chronic obstructive pulmonary disease with (acute) exacerbation: Secondary | ICD-10-CM | POA: Diagnosis not present

## 2016-06-17 DIAGNOSIS — Z79899 Other long term (current) drug therapy: Secondary | ICD-10-CM | POA: Diagnosis not present

## 2016-06-17 DIAGNOSIS — N390 Urinary tract infection, site not specified: Secondary | ICD-10-CM | POA: Diagnosis not present

## 2016-06-17 LAB — COMPREHENSIVE METABOLIC PANEL
ALT: 17 U/L (ref 14–54)
AST: 23 U/L (ref 15–41)
Albumin: 3.7 g/dL (ref 3.5–5.0)
Alkaline Phosphatase: 56 U/L (ref 38–126)
Anion gap: 8 (ref 5–15)
BUN: 16 mg/dL (ref 6–20)
CO2: 23 mmol/L (ref 22–32)
Calcium: 8.4 mg/dL — ABNORMAL LOW (ref 8.9–10.3)
Chloride: 100 mmol/L — ABNORMAL LOW (ref 101–111)
Creatinine, Ser: 0.88 mg/dL (ref 0.44–1.00)
GFR calc Af Amer: >60 mL/min (ref >60)
GFR calc non Af Amer: >60 mL/min (ref >60)
Glucose, Bld: 108 mg/dL — ABNORMAL HIGH (ref 65–99)
Potassium: 3.9 mmol/L (ref 3.5–5.1)
Sodium: 131 mmol/L — ABNORMAL LOW (ref 135–145)
Total Bilirubin: 0.5 mg/dL (ref 0.3–1.2)
Total Protein: 6.6 g/dL (ref 6.5–8.1)

## 2016-06-17 LAB — CBC WITH DIFFERENTIAL/PLATELET
Basophils Absolute: 0.1 10*3/uL (ref 0.0–0.1)
Basophils Relative: 1 %
Eosinophils Absolute: 0.1 10*3/uL (ref 0.0–0.7)
Eosinophils Relative: 1 %
HCT: 39.1 % (ref 36.0–46.0)
Hemoglobin: 13 g/dL (ref 12.0–15.0)
Lymphocytes Relative: 13 %
Lymphs Abs: 1.1 10*3/uL (ref 0.7–4.0)
MCH: 31.2 pg (ref 26.0–34.0)
MCHC: 33.2 g/dL (ref 30.0–36.0)
MCV: 93.8 fL (ref 78.0–100.0)
Monocytes Absolute: 0.5 10*3/uL (ref 0.1–1.0)
Monocytes Relative: 6 %
Neutro Abs: 6.6 10*3/uL (ref 1.7–7.7)
Neutrophils Relative %: 79 %
Platelets: 298 10*3/uL (ref 150–400)
RBC: 4.17 MIL/uL (ref 3.87–5.11)
RDW: 13.5 % (ref 11.5–15.5)
WBC: 8.3 10*3/uL (ref 4.0–10.5)

## 2016-06-17 LAB — ETHANOL: Alcohol, Ethyl (B): 340 mg/dL (ref <5)

## 2016-06-17 MED ORDER — IPRATROPIUM BROMIDE 0.02 % IN SOLN
0.5000 mg | Freq: Once | RESPIRATORY_TRACT | Status: AC
  Administered 2016-06-17: 0.5 mg via RESPIRATORY_TRACT
  Filled 2016-06-17: qty 2.5

## 2016-06-17 MED ORDER — ALBUTEROL SULFATE (2.5 MG/3ML) 0.083% IN NEBU
5.0000 mg | INHALATION_SOLUTION | Freq: Once | RESPIRATORY_TRACT | Status: AC
  Administered 2016-06-17: 5 mg via RESPIRATORY_TRACT
  Filled 2016-06-17: qty 6

## 2016-06-17 NOTE — ED Notes (Signed)
No respiratory or acute distress noted alert and talking pt smells of alcohol at times ask the same question over and over where her friend is.

## 2016-06-17 NOTE — ED Triage Notes (Signed)
Picked up out of another persons car due to alcohol intoxication unable to get pt out of car ems called to transport to hospital.

## 2016-06-17 NOTE — ED Notes (Signed)
Bed: [facility] Expected date:  Expected time:  Means of arrival:  Comments: EMS intoxicated female/fall

## 2016-06-17 NOTE — ED Provider Notes (Signed)
[facility] DEPT Provider Note   CSN: [id number] Arrival date & time: 06/17/16  2141     History   Chief Complaint Chief Complaint  Patient presents with  . Alcohol Intoxication    HPI Andrea Larson is a 70 y.o. female.  HPI  Pt presenting with alcohol intoxication.  Pt is here via EMS- her friend states they had multiple drinks at dinner and patient was having a difficult time walking and another person called EMS.  Pt was found by EMS in another persons car intoxicated and was transported to the ED.  Pt states she drinks every day, but states she did not drink more than usual.  She has hx of COPD- she states she feels some shortness of breath.  No fever/chills.  No fainting, no chest pain.  No vomiting.  There are no other associated systemic symptoms, there are no other alleviating or modifying factors.   Past Medical History:  Diagnosis Date  . Allergic rhinitis   . Allergic rhinitis, cause unspecified 09/21/2011  . Arthritis   . Asthma   . Asthma 09/21/2011  . Colon polyps   . COPD (chronic obstructive pulmonary disease) (HCC)    mild   . Hyperlipidemia   . UTI (lower urinary tract infection)     Patient Active Problem List   Diagnosis Date Noted  . Insomnia 08/18/2015  . GERD (gastroesophageal reflux disease) 08/18/2015  . Acute bronchitis 02/15/2015  . COPD exacerbation ([facility]) 02/15/2015  . Impaired glucose tolerance 10/31/2013  . Tachycardia 10/28/2012  . Back pain 10/28/2012  . Asthma 09/21/2011  . Allergic rhinitis 09/21/2011  . Colon polyps 09/21/2011  . Fatigue 09/21/2011  . Preventative health care 09/15/2011  . SYNCOPE 06/20/2010  . DEGENERATIVE JOINT DISEASE, CERVICAL SPINE 10/21/2008  . Overweight(278.02) 02/10/2008  . ANXIETY DEPRESSION 02/10/2008  . COPD (chronic obstructive pulmonary disease) ([place]) 02/10/2008  . EMPHYSEMA NEC 01/30/2007  . HYPERLIPIDEMIA 12/31/2006    Past Surgical History:  Procedure Laterality Date  . DENTAL  SURGERY    . pilonidial cyst    . TONSILLECTOMY      OB History    No data available       Home Medications    Prior to Admission medications   Medication Sig Start Date End Date Taking? Authorizing Provider  albuterol (PROVENTIL) (2.5 MG/3ML) 0.083% nebulizer solution Take 3 mLs (2.5 mg total) by nebulization every 6 (six) hours as needed for wheezing or shortness of breath. DX J44.9 06/20/16   Marshell Garfinkel, MD  ALPRAZolam (XANAX) 0.25 MG tablet TAKE (1) TABLET TWICE A DAY AS NEEDED. 02/28/16   Biagio Borg, MD  bacitracin ointment Apply 1 application topically 2 (two) times daily. 12/19/15   Ozella Almond Ward, PA-C  cephALEXin (KEFLEX) 500 MG capsule Take 1 capsule (500 mg total) by mouth 4 (four) times daily. 06/18/16   Alfonzo Beers, MD  clonazePAM (KLONOPIN) 1 MG tablet TAKE 1 TABLET AT BEDTIME AS NEEDED. Patient not taking: Reported on 06/20/2016 01/19/16   Biagio Borg, MD  FLUoxetine (PROZAC) 40 MG capsule Take 1 capsule (40 mg total) by mouth daily. 08/18/15   Biagio Borg, MD  pantoprazole (PROTONIX) 40 MG tablet Take 1 tablet (40 mg total) by mouth daily. 08/18/15   Biagio Borg, MD  predniSONE (STERAPRED UNI-PAK 21 TAB) 10 MG (21) TBPK tablet Take 1 tablet (10 mg total) by mouth daily. Take 6 tabs by mouth daily  for 2 days, then 5 tabs for 2  days, then 4 tabs for 2 days, then 3 tabs for 2 days, 2 tabs for 2 days, then 1 tab by mouth daily for 2 days 06/18/16   Alfonzo Beers, MD  rosuvastatin (CRESTOR) 20 MG tablet TAKE 1 TABLET ONCE DAILY. 05/29/16   Biagio Borg, MD  SPIRIVA RESPIMAT 2.5 MCG/ACT AERS USE 1 PUFF DAILY AS DIRECTED 03/16/16   Biagio Borg, MD  sulfamethoxazole-trimethoprim (BACTRIM DS,SEPTRA DS) 800-160 MG tablet Take 1 tablet by mouth 2 (two) times daily. 06/20/16   Marshell Garfinkel, MD  SYMBICORT 160-4.5 MCG/ACT inhaler USE 2 INHALATIONS TWICE DAILY 10/20/15   Biagio Borg, MD  VENTOLIN HFA 108 [phone] Base) MCG/ACT inhaler USE 2 PUFFS EVERY FOUR HOURS AS NEEDED FOR  WHEEZING OR SHORTNESS OF BREATH. 05/21/16   Biagio Borg, MD    Family History Family History  Problem Relation Age of Onset  . Alcohol abuse Other   . Arthritis Other   . Heart disease Other   . Stroke Other   . Hypertension Other   . Colon cancer Neg Hx     Social History Social History  Substance Use Topics  . Smoking status: Former Smoker    Packs/day: 1.00    Types: Cigarettes    Quit date: 04/18/2015  . Smokeless tobacco: Never Used  . Alcohol use 0.0 oz/week     Comment: occ.      Allergies   Lipitor [atorvastatin] and Sulfa antibiotics   Review of Systems Review of Systems  ROS reviewed and all otherwise negative except for mentioned in HPI   Physical Exam Updated Vital Signs BP 125/71   Pulse 96   Temp 97.7 F (36.5 C) (Oral)   Resp 22   Ht 5\' 4"  (1.626 m)   Wt 63.5 kg   SpO2 94%   BMI 24.03 kg/m  Vitals reviewed Physical Exam  Physical Examination: General appearance - alert but intoxicated appearing, well appearing, and in no distress Mental status - alert, oriented to person, place, and time Eyes - no conjunctival injection, no scleral icterus Mouth - mucous membranes moist, pharynx normal without lesions Chest - BSS, bilateral wheezing with prolonged expiratory phase Heart - normal rate, regular rhythm, normal S1, S2, no murmurs, rubs, clicks or gallops Abdomen - soft, nontender, nondistended, no masses or organomegaly Neurological - alert, oriented x 3, moving all extremities, GCS 15 Extremities - peripheral pulses normal, no pedal edema, no clubbing or cyanosis Skin - normal coloration and turgor, no rashes, no suspicious skin lesions noted Psych- appears intoxicated, intermittently tearful, cooperative   ED Treatments / Results  Labs (all labs ordered are listed, but only abnormal results are displayed) Labs Reviewed  URINE CULTURE - Abnormal; Notable for the following:       Result Value   Culture >=100,000 COLONIES/mL ESCHERICHIA  COLI (*)    Organism ID, Bacteria ESCHERICHIA COLI (*)    All other components within normal limits  COMPREHENSIVE METABOLIC PANEL - Abnormal; Notable for the following:    Sodium 131 (*)    Chloride 100 (*)    Glucose, Bld 108 (*)    Calcium 8.4 (*)    All other components within normal limits  ETHANOL - Abnormal; Notable for the following:    Alcohol, Ethyl (B) 340 (*)    All other components within normal limits  RAPID URINE DRUG SCREEN, HOSP PERFORMED - Abnormal; Notable for the following:    Benzodiazepines POSITIVE (*)    All other components within  normal limits  URINALYSIS, ROUTINE W REFLEX MICROSCOPIC - Abnormal; Notable for the following:    Hgb urine dipstick SMALL (*)    Protein, ur 30 (*)    Nitrite POSITIVE (*)    Bacteria, UA MANY (*)    Squamous Epithelial / LPF 0-5 (*)    All other components within normal limits  CBC WITH DIFFERENTIAL/PLATELET    EKG  EKG Interpretation  Date/Time:  Sunday June 17 2016 23:30:22 EST Ventricular Rate:  94 PR Interval:    QRS Duration: 102 QT Interval:  363 QTC Calculation: 454 R Axis:   76 Text Interpretation:  Sinus rhythm Anteroseptal infarct, age indeterminate Baseline wander in lead(s) V3 No old tracing to compare Confirmed by [facility]  MD, MARTHA [phone]) on 06/18/2016 1:10:27 AM Also confirmed by Canary Brim  MD, MARTHA [phone]), editor [place]  CCT, BEVERLY ([id number])  on 06/18/2016 6:50:17 AM       Radiology No results found.  Procedures Procedures (including critical care time)  Medications Ordered in ED Medications  albuterol (PROVENTIL) (2.5 MG/3ML) 0.083% nebulizer solution 5 mg (5 mg Nebulization Given 06/17/16 2327)  ipratropium (ATROVENT) nebulizer solution 0.5 mg (0.5 mg Nebulization Given 06/17/16 2327)  albuterol (PROVENTIL) (2.5 MG/3ML) 0.083% nebulizer solution 5 mg (5 mg Nebulization Given 06/18/16 0104)  ipratropium (ATROVENT) nebulizer solution 0.5 mg (0.5 mg Nebulization Given 06/18/16 0104)    cephALEXin (KEFLEX) capsule 500 mg (500 mg Oral Given 06/18/16 0132)  predniSONE (DELTASONE) tablet 60 mg (60 mg Oral Given 06/18/16 0144)     Initial Impression / Assessment and Plan / ED Course  I have reviewed the triage vital signs and the nursing notes.  Pertinent labs & imaging results that were available during my care of the patient were reviewed by me and considered in my medical decision making (see chart for details).  Clinical Course     Pt presenting with alcohol intoxication.  Her etoh level is over 300.  She endorses daily drinking.  She has hx of COPD and has some wheezing on exam.  Pt treated with 2 duonebs- will go ahead and give prednisone.  Her UDS is positive for benzos which she is prescribed.  Her UA is c/w UTI as well.  Urine culture sent.  Will start on keflex in the ED.  Pt will need to be observed until more sober.  Would discharge with course of steroids, keflex for UTI.  She denies SI/HI.  Pt signed out at 2am pending recheck and disposition.    Final Clinical Impressions(s) / ED Diagnoses   Final diagnoses:  Alcoholic intoxication without complication ([place])  COPD exacerbation (HCC)  Urinary tract infection without hematuria, site unspecified    New Prescriptions Discharge Medication List as of 06/18/2016  2:29 AM    START taking these medications   Details  cephALEXin (KEFLEX) 500 MG capsule Take 1 capsule (500 mg total) by mouth 4 (four) times daily., Starting Mon 06/18/2016, Print    predniSONE (STERAPRED UNI-PAK 21 TAB) 10 MG (21) TBPK tablet Take 1 tablet (10 mg total) by mouth daily. Take 6 tabs by mouth daily  for 2 days, then 5 tabs for 2 days, then 4 tabs for 2 days, then 3 tabs for 2 days, 2 tabs for 2 days, then 1 tab by mouth daily for 2 days, Starting Mon 06/18/2016, Print         Alfonzo Beers, MD 06/20/16 1654

## 2016-06-18 LAB — URINALYSIS, ROUTINE W REFLEX MICROSCOPIC
Bilirubin Urine: NEGATIVE
Glucose, UA: NEGATIVE mg/dL
Ketones, ur: NEGATIVE mg/dL
Leukocytes, UA: NEGATIVE
Nitrite: POSITIVE — AB
Protein, ur: 30 mg/dL — AB
Specific Gravity, Urine: 1.015 (ref 1.005–1.030)
pH: 5 (ref 5.0–8.0)

## 2016-06-18 LAB — RAPID URINE DRUG SCREEN, HOSP PERFORMED
Amphetamines: NOT DETECTED
Barbiturates: NOT DETECTED
Benzodiazepines: POSITIVE — AB
Cocaine: NOT DETECTED
Opiates: NOT DETECTED
Tetrahydrocannabinol: NOT DETECTED

## 2016-06-18 MED ORDER — IPRATROPIUM BROMIDE 0.02 % IN SOLN
0.5000 mg | Freq: Once | RESPIRATORY_TRACT | Status: AC
  Administered 2016-06-18: 0.5 mg via RESPIRATORY_TRACT
  Filled 2016-06-18: qty 2.5

## 2016-06-18 MED ORDER — PREDNISONE 20 MG PO TABS
60.0000 mg | ORAL_TABLET | Freq: Once | ORAL | Status: AC
  Administered 2016-06-18: 60 mg via ORAL
  Filled 2016-06-18: qty 3

## 2016-06-18 MED ORDER — PREDNISONE 10 MG (21) PO TBPK: 10.0000 mg | ORAL_TABLET | Freq: Every day | ORAL | 0 refills | Status: DC

## 2016-06-18 MED ORDER — CEPHALEXIN 500 MG PO CAPS: 500.0000 mg | ORAL_CAPSULE | Freq: Four times a day (QID) | ORAL | 0 refills | Status: DC

## 2016-06-18 MED ORDER — CEPHALEXIN 500 MG PO CAPS
500.0000 mg | ORAL_CAPSULE | Freq: Once | ORAL | Status: AC
  Administered 2016-06-18: 500 mg via ORAL
  Filled 2016-06-18: qty 1

## 2016-06-18 MED ORDER — ALBUTEROL SULFATE (2.5 MG/3ML) 0.083% IN NEBU
5.0000 mg | INHALATION_SOLUTION | Freq: Once | RESPIRATORY_TRACT | Status: AC
  Administered 2016-06-18: 5 mg via RESPIRATORY_TRACT
  Filled 2016-06-18: qty 6

## 2016-06-18 NOTE — Discharge Instructions (Signed)
Return to the ED with any concerns including difficulty breathing despite using albuterol every 4 hours, not drinking fluids, decreased urine output, vomiting and not able to keep down liquids or medications, decreased level of alertness/lethargy, or any other alarming symptoms °

## 2016-06-18 NOTE — ED Provider Notes (Signed)
2:29 AM  Patient is requesting discharge. She is ambulatory independently to the bathroom. She has no complaints. She is not slurring her words. Satting 95% on room air and no respiratory distress. We'll discharge him per Dr. Liliane Shi instructions.  After history, exam, and medical workup I feel the patient has been appropriately medically screened and is safe for discharge home. Pertinent diagnoses were discussed with the patient. Patient was given return precautions.    Merryl Hacker, MD 06/18/16 0230

## 2016-06-20 ENCOUNTER — Ambulatory Visit (INDEPENDENT_AMBULATORY_CARE_PROVIDER_SITE_OTHER): Payer: Medicare Other | Admitting: Pulmonary Disease

## 2016-06-20 ENCOUNTER — Encounter: Payer: Self-pay | Admitting: Pulmonary Disease

## 2016-06-20 VITALS — BP 120/72 | HR 90 | Ht 64.0 in | Wt 135.6 lb

## 2016-06-20 DIAGNOSIS — J449 Chronic obstructive pulmonary disease, unspecified: Secondary | ICD-10-CM

## 2016-06-20 DIAGNOSIS — Z87891 Personal history of nicotine dependence: Secondary | ICD-10-CM | POA: Diagnosis not present

## 2016-06-20 DIAGNOSIS — R0600 Dyspnea, unspecified: Secondary | ICD-10-CM | POA: Diagnosis not present

## 2016-06-20 DIAGNOSIS — R0689 Other abnormalities of breathing: Secondary | ICD-10-CM | POA: Diagnosis not present

## 2016-06-20 LAB — URINE CULTURE: Culture: >=100000 — AB

## 2016-06-20 MED ORDER — SULFAMETHOXAZOLE-TRIMETHOPRIM 800-160 MG PO TABS: 1.0000 | ORAL_TABLET | Freq: Two times a day (BID) | ORAL | 0 refills | Status: DC

## 2016-06-20 MED ORDER — ALBUTEROL SULFATE (2.5 MG/3ML) 0.083% IN NEBU: 2.5000 mg | INHALATION_SOLUTION | Freq: Four times a day (QID) | RESPIRATORY_TRACT | 12 refills | Status: DC | PRN

## 2016-06-20 NOTE — Patient Instructions (Signed)
We'll schedule for pulmonary function tests. We'll start him on Trelegy inhaler. Use this instead of the Spiriva and Symbicort. Continue using the albuterol rescue inhaler.  We'll prescribe nebulizer machine and albuterol nebulizers when necessary. We refer you for a low-dose screening CT. We'll prescribe Bactrim double strength tablet one twice a day for 7 days. Stopped taking the Keflex.  Return to clinic in 1-2 months.

## 2016-06-20 NOTE — Progress Notes (Signed)
Andrea Larson    [id number]    [birth year]  Primary Care Physician:James Jenny Reichmann, MD  Referring Physician: Biagio Borg, MD [address] [place], [place] [address]  Chief complaint:  Consult for dyspnea.  HPI: Andrea Larson is a 70 year old with past medical history of COPD, asthma, hyperlipidemia. She has complains of dyspnea on exertion and at rest and occasional cough with sputum production, wheezing. She has symptoms every day throughout the year and at least 2 exacerbations per year requiring prednisone and antibiotic taper. She is currently on Symbicort and Spiriva. She has been on these medications for several years and she feels that helped with her symptoms. She is also using her albuterol rescue inhaler at least 2 times per day. She denies any nighttime awakenings She was seen in the ED on 06/17/16 for alcohol intoxication, COPD exacerbation. She was given Keflex for UTI and a prednisone taper. She feels that her symptoms are much better while on the prednisone. She has 4 days of the taper left.  She has 60-pack-year smoking history, quit in October 2016. 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  Outpatient Encounter Prescriptions as of 06/20/2016  Medication Sig  . ALPRAZolam (XANAX) 0.25 MG tablet TAKE (1) TABLET TWICE A DAY AS NEEDED.  . bacitracin ointment Apply 1 application topically 2 (two) times daily.  . cephALEXin (KEFLEX) 500 MG capsule Take 1 capsule (500 mg total) by mouth 4 (four) times daily.  Marland Kitchen FLUoxetine (PROZAC) 40 MG capsule Take 1 capsule (40 mg total) by mouth daily.  . pantoprazole (PROTONIX) 40 MG tablet Take 1 tablet (40 mg total) by mouth daily.  . predniSONE (STERAPRED UNI-PAK 21 TAB) 10 MG (21) TBPK tablet Take 1 tablet (10 mg total) by mouth daily. Take 6 tabs by mouth daily  for 2 days, then 5 tabs for 2 days, then 4 tabs for 2 days, then 3 tabs for 2 days, 2 tabs for  2 days, then 1 tab by mouth daily for 2 days  . rosuvastatin (CRESTOR) 20 MG tablet TAKE 1 TABLET ONCE DAILY.  Marland Kitchen SPIRIVA RESPIMAT 2.5 MCG/ACT AERS USE 1 PUFF DAILY AS DIRECTED  . SYMBICORT 160-4.5 MCG/ACT inhaler USE 2 INHALATIONS TWICE DAILY  . VENTOLIN HFA 108 (90 Base) MCG/ACT inhaler USE 2 PUFFS EVERY FOUR HOURS AS NEEDED FOR WHEEZING OR SHORTNESS OF BREATH.  . [DISCONTINUED] aspirin 81 MG tablet Take 81 mg by mouth daily.  . clonazePAM (KLONOPIN) 1 MG tablet TAKE 1 TABLET AT BEDTIME AS NEEDED. (Patient not taking: Reported on 06/20/2016)  . [DISCONTINUED] fexofenadine (ALLEGRA) 180 MG tablet Take 1 tablet (180 mg total) by mouth daily. (Patient not taking: Reported on 12/18/2015)  . [DISCONTINUED] HYDROcodone-acetaminophen (NORCO/VICODIN) 5-325 MG tablet Take 1 tablet by mouth every 6 (six) hours as needed.  . [DISCONTINUED] levofloxacin (LEVAQUIN) 250 MG tablet Take 1 tablet (250 mg total) by mouth daily. (Patient not taking: Reported on 12/18/2015)   No facility-administered encounter medications on file as of 06/20/2016.     Allergies as of 06/20/2016 - Review Complete 06/20/2016  Allergen Reaction Noted  . Lipitor [atorvastatin]  10/28/2012  . Sulfa antibiotics  08/20/2011    Past Medical History:  Diagnosis Date  . Allergic rhinitis   . Allergic rhinitis, cause unspecified 09/21/2011  . Arthritis   . Asthma   . Asthma 09/21/2011  . Colon polyps   .  COPD (chronic obstructive pulmonary disease) (HCC)    mild   . Hyperlipidemia   . UTI (lower urinary tract infection)     Past Surgical History:  Procedure Laterality Date  . DENTAL SURGERY    . pilonidial cyst    . TONSILLECTOMY      Family History  Problem Relation Age of Onset  . Alcohol abuse Other   . Arthritis Other   . Heart disease Other   . Stroke Other   . Hypertension Other   . Colon cancer Neg Hx     Social History   Social History  . Marital status: Divorced    Spouse name: N/A  . Number of  children: N/A  . Years of education: N/A   Occupational History  . Not on file.   Social History Main Topics  . Smoking status: Former Smoker    Types: Cigarettes    Quit date: 04/18/2015  . Smokeless tobacco: Never Used  . Alcohol use 0.0 oz/week     Comment: occ.   . Drug use: No  . Sexual activity: Not on file   Other Topics Concern  . Not on file   Social History Narrative  . No narrative on file   Review of systems: Review of Systems  Constitutional: Negative for fever and chills.  HENT: Negative.   Eyes: Negative for blurred vision.  Respiratory: as per HPI  Cardiovascular: Negative for chest pain and palpitations.  Gastrointestinal: Negative for vomiting, diarrhea, blood per rectum. Genitourinary: Negative for dysuria, urgency, frequency and hematuria.  Musculoskeletal: Negative for myalgias, back pain and joint pain.  Skin: Negative for itching and rash.  Neurological: Negative for dizziness, tremors, focal weakness, seizures and loss of consciousness.  Endo/Heme/Allergies: Negative for environmental allergies.  Psychiatric/Behavioral: Negative for depression, suicidal ideas and hallucinations.  All other systems reviewed and are negative.  Physical Exam: Blood pressure 120/72, pulse 90, height 5\' 4"  (1.626 m), weight 135 lb 9.6 oz (61.5 kg), SpO2 95 %. Gen:      No acute distress HEENT:  EOMI, sclera anicteric Neck:     No masses; no thyromegaly Lungs:    Clear to auscultation bilaterally; normal respiratory effort CV:         Regular rate and rhythm; no murmurs Abd:      + bowel sounds; soft, non-tender; no palpable masses, no distension Ext:    No edema; adequate peripheral perfusion Skin:      Warm and dry; no rash Neuro: alert and oriented x 3 Psych: normal mood and affect  Data Reviewed: CXR 06/17/16- No acute cardiopulmonary abnormality. Images reviewed  Assessment:  #1 Evaluation for dyspnea. She likely has COPD based on her smoking history,  presentation. She is just coming off her prednisone taper with improvement in symptoms. I'll switch her inhalers from Spiriva, Symbicort to trelegy. She'll get scheduled for pulmonary function tests and get nebulizer for her home.  She is a good candidate for the Danerexin COPD study. We'll get consent after the PFTs are done..  #2 Ex smoker Refer to lung cancer screening program  #3 UTI She was started 2 days ago on Keflex for UTI. Her culture is growing resistant Escherichia coli. I'll switch her antibiotics to Bactrim. She has a record of allergies to sulfa but it is minor consisting of nausea and has occurred in her childhood. So she should be ok on bactrim. She will let us know if she has any reaction to new abx  Plan/Recommendations: -  Stop symbicort, spiriva. - Start trelegy, albuterol nebs PRN - Refer to cancer screening program - Stop keflex, start bactrim for 7 days  Marshell Garfinkel MD [facility] Pager [phone] 06/20/2016, 11:31 AM  CC: Biagio Borg, MD

## 2016-06-21 ENCOUNTER — Telehealth (HOSPITAL_BASED_OUTPATIENT_CLINIC_OR_DEPARTMENT_OTHER): Payer: Self-pay

## 2016-06-21 NOTE — Telephone Encounter (Signed)
Post ED Visit - Positive Culture Follow-up  Culture report reviewed by antimicrobial stewardship pharmacist:  [x]  Elenor Quinones, Pharm.D. []  Heide Guile, Pharm.D., BCPS []  Parks Neptune, Pharm.D. []  Alycia Rossetti, Pharm.D., BCPS []  [place], [place].D., BCPS, AAHIVP []  Legrand Como, Pharm.D., BCPS, AAHIVP []  Milus Glazier, Pharm.D. []  Stephens November, Pharm.D.  Positive urine culture, >/= 100,000 colonies -> E Coli Dcd home on Cephalexin  Chart reviewed by Harlene Ramus  PA "No treatment necessary"  Dortha Kern 06/21/2016, 12:16 PM

## 2016-06-21 NOTE — Progress Notes (Signed)
ED Antimicrobial Stewardship Positive Culture Follow Up   Andrea Larson is an 70 y.o. female who presented to [facility] on 06/17/2016 with a chief complaint of  Chief Complaint  Patient presents with  . Alcohol Intoxication    Recent Results (from the past 720 hour(s))  Urine culture     Status: Abnormal   Collection Time: 06/18/16 12:48 AM  Result Value Ref Range Status   Specimen Description URINE, RANDOM  Final   Special Requests NONE  Final   Culture >=100,000 COLONIES/mL ESCHERICHIA COLI (A)  Final   Report Status 06/20/2016 FINAL  Final   Organism ID, Bacteria ESCHERICHIA COLI (A)  Final      Susceptibility   Escherichia coli - MIC*    AMPICILLIN >=32 RESISTANT Resistant     CEFAZOLIN >=64 RESISTANT Resistant     CEFTRIAXONE 16 INTERMEDIATE Intermediate     CIPROFLOXACIN >=4 RESISTANT Resistant     GENTAMICIN <=1 SENSITIVE Sensitive     IMIPENEM <=0.25 SENSITIVE Sensitive     NITROFURANTOIN 32 SENSITIVE Sensitive     TRIMETH/SULFA <=20 SENSITIVE Sensitive     AMPICILLIN/SULBACTAM >=32 RESISTANT Resistant     PIP/TAZO 32 INTERMEDIATE Intermediate     Extended ESBL NEGATIVE Sensitive     * >=100,000 COLONIES/mL ESCHERICHIA COLI    [x]  Treated with Keflex, organism resistant to prescribed antimicrobial  Presented due to alcohol intoxication. No urinary symptoms present. Considered to be asymptomatic bacteruria.  No further antibiotics needed  ED Provider: Harlene Ramus PA-C   Andrea Larson 06/21/2016, 9:38 AM Infectious Diseases Pharmacist Phone# [phone]

## 2016-07-10 ENCOUNTER — Other Ambulatory Visit: Payer: Self-pay | Admitting: Internal Medicine

## 2016-07-10 ENCOUNTER — Encounter: Payer: Medicare Other | Admitting: Internal Medicine

## 2016-07-16 ENCOUNTER — Telehealth: Payer: Self-pay | Admitting: Internal Medicine

## 2016-07-16 NOTE — Telephone Encounter (Signed)
Pt is coming in for her physical on Friday and she is requesting to get her labs done ahead of time.  Her insurance has changed to [facility]. She states she doesn't care about insurance. She will fight with them later. She insists she wants the results when she is there for the appt Please call pt at [phone] to let her know

## 2016-07-17 ENCOUNTER — Telehealth: Payer: Self-pay | Admitting: Internal Medicine

## 2016-07-17 DIAGNOSIS — Z0001 Encounter for general adult medical examination with abnormal findings: Secondary | ICD-10-CM

## 2016-07-17 NOTE — Telephone Encounter (Signed)
Labs done.

## 2016-07-17 NOTE — Telephone Encounter (Signed)
Patient aware.

## 2016-07-17 NOTE — Telephone Encounter (Signed)
Labs ordered.

## 2016-07-18 ENCOUNTER — Institutional Professional Consult (permissible substitution): Payer: Medicare Other | Admitting: Emergency Medicine

## 2016-07-18 ENCOUNTER — Other Ambulatory Visit (INDEPENDENT_AMBULATORY_CARE_PROVIDER_SITE_OTHER): Payer: Medicare HMO

## 2016-07-18 DIAGNOSIS — Z0001 Encounter for general adult medical examination with abnormal findings: Secondary | ICD-10-CM

## 2016-07-18 LAB — URINALYSIS, ROUTINE W REFLEX MICROSCOPIC
Leukocytes, UA: NEGATIVE
Nitrite: NEGATIVE
Specific Gravity, Urine: 1.02 (ref 1.000–1.030)
Urine Glucose: NEGATIVE
Urobilinogen, UA: 0.2 (ref 0.0–1.0)
pH: 6 (ref 5.0–8.0)

## 2016-07-18 LAB — HEPATIC FUNCTION PANEL
ALT: 13 U/L (ref 0–35)
AST: 17 U/L (ref 0–37)
Albumin: 3.8 g/dL (ref 3.5–5.2)
Alkaline Phosphatase: 61 U/L (ref 39–117)
Bilirubin, Direct: 0.1 mg/dL (ref 0.0–0.3)
Total Bilirubin: 0.5 mg/dL (ref 0.2–1.2)
Total Protein: 6.9 g/dL (ref 6.0–8.3)

## 2016-07-18 LAB — LIPID PANEL
Cholesterol: 218 mg/dL — ABNORMAL HIGH (ref 0–200)
HDL: 79 mg/dL (ref >39.00)
LDL Cholesterol: 107 mg/dL — ABNORMAL HIGH (ref 0–99)
NonHDL: 139.26
Total CHOL/HDL Ratio: 3
Triglycerides: 160 mg/dL — ABNORMAL HIGH (ref 0.0–149.0)
VLDL: 32 mg/dL (ref 0.0–40.0)

## 2016-07-18 LAB — BASIC METABOLIC PANEL
BUN: 10 mg/dL (ref 6–23)
CO2: 24 mEq/L (ref 19–32)
Calcium: 9.1 mg/dL (ref 8.4–10.5)
Chloride: 104 mEq/L (ref 96–112)
Creatinine, Ser: 1.02 mg/dL (ref 0.40–1.20)
GFR: 56.48 mL/min — ABNORMAL LOW (ref >60.00)
Glucose, Bld: 112 mg/dL — ABNORMAL HIGH (ref 70–99)
Potassium: 4.7 mEq/L (ref 3.5–5.1)
Sodium: 138 mEq/L (ref 135–145)

## 2016-07-18 LAB — CBC WITH DIFFERENTIAL/PLATELET
Basophils Absolute: 0 10*3/uL (ref 0.0–0.1)
Basophils Relative: 0.8 % (ref 0.0–3.0)
Eosinophils Absolute: 0.3 10*3/uL (ref 0.0–0.7)
Eosinophils Relative: 4.2 % (ref 0.0–5.0)
HCT: 42.4 % (ref 36.0–46.0)
Hemoglobin: 14.4 g/dL (ref 12.0–15.0)
Lymphocytes Relative: 22.2 % (ref 12.0–46.0)
Lymphs Abs: 1.4 10*3/uL (ref 0.7–4.0)
MCHC: 34 g/dL (ref 30.0–36.0)
MCV: 93.9 fl (ref 78.0–100.0)
Monocytes Absolute: 0.5 10*3/uL (ref 0.1–1.0)
Monocytes Relative: 8.3 % (ref 3.0–12.0)
Neutro Abs: 4 10*3/uL (ref 1.4–7.7)
Neutrophils Relative %: 64.5 % (ref 43.0–77.0)
Platelets: 381 10*3/uL (ref 150.0–400.0)
RBC: 4.52 Mil/uL (ref 3.87–5.11)
RDW: 14.5 % (ref 11.5–15.5)
WBC: 6.2 10*3/uL (ref 4.0–10.5)

## 2016-07-18 LAB — TSH: TSH: 1.68 u[IU]/mL (ref 0.35–4.50)

## 2016-07-20 ENCOUNTER — Ambulatory Visit (INDEPENDENT_AMBULATORY_CARE_PROVIDER_SITE_OTHER): Payer: Medicare HMO | Admitting: Internal Medicine

## 2016-07-20 ENCOUNTER — Other Ambulatory Visit: Payer: Medicare HMO

## 2016-07-20 VITALS — BP 128/80 | HR 104 | Temp 97.6°F | Resp 20 | Wt 136.0 lb

## 2016-07-20 DIAGNOSIS — R739 Hyperglycemia, unspecified: Secondary | ICD-10-CM | POA: Diagnosis not present

## 2016-07-20 DIAGNOSIS — R079 Chest pain, unspecified: Secondary | ICD-10-CM | POA: Diagnosis not present

## 2016-07-20 DIAGNOSIS — J449 Chronic obstructive pulmonary disease, unspecified: Secondary | ICD-10-CM | POA: Diagnosis not present

## 2016-07-20 DIAGNOSIS — Z0001 Encounter for general adult medical examination with abnormal findings: Secondary | ICD-10-CM | POA: Diagnosis not present

## 2016-07-20 DIAGNOSIS — E785 Hyperlipidemia, unspecified: Secondary | ICD-10-CM

## 2016-07-20 MED ORDER — GABAPENTIN 100 MG PO CAPS: 100.0000 mg | ORAL_CAPSULE | Freq: Three times a day (TID) | ORAL | 5 refills | Status: DC

## 2016-07-20 NOTE — Assessment & Plan Note (Signed)

## 2016-07-20 NOTE — Assessment & Plan Note (Signed)
With minor elev LDL 107, for lower chol diet,  to f/u any worsening symptoms or concerns

## 2016-07-20 NOTE — Assessment & Plan Note (Signed)
I think ideally should have a LDCT done yearly for lung ca screening, pt plans to f/u with pulm

## 2016-07-20 NOTE — Patient Instructions (Signed)
OK for trial nerve pain medication - gabapentin (sent to pharmacy), but ok to stop if not better in 1 week  Your EKG was OK today  You will be contacted regarding the referral for: cardiology  Please follow up with Pulmonary regarding Low Dose CT chest yearly lung cancer screening program  Please continue all other medications as before, and refills have been done if requested.  Please have the pharmacy call with any other refills you may need.  Please continue your efforts at being more active, low cholesterol diet, and weight control.  You are otherwise up to date with prevention measures today.  Please keep your appointments with your specialists as you may have planned  Please go to the LAB in the Basement (turn left off the elevator) for the tests to be done today  You will be contacted by phone if any changes need to be made immediately.  Otherwise, you will receive a letter about your results with an explanation, but please check with MyChart first.  Please remember to sign up for MyChart if you have not done so, as this will be important to you in the future with finding out test results, communicating by private email, and scheduling acute appointments online when needed.  Please return in 6 months, or sooner if needed

## 2016-07-20 NOTE — Assessment & Plan Note (Signed)
ecg reviewed, atypical, etiology unclear, for cardiology referral per pt request  In addition to the time spent performing CPE, I spent an additional 25 minutes face to face,in which greater than 50% of this time was spent in counseling and coordination of care for patient's acute illness as documented.\

## 2016-07-20 NOTE — Progress Notes (Addendum)
Subjective:    Patient ID: Andrea Larson, female    DOB: 01-15-1946, 71 y.o.   MRN: [id number]  HPI    Here for wellness and f/u;  Overall doing ok;  Pt denies  worsening SOB, DOE exzcept that as chronic, or worsening wheezing, orthopnea, PND, or worsening LE edema, palpitations, dizziness or syncope.  Pt denies neurological change such as new headache, facial or extremity weakness.  Pt denies polydipsia, polyuria, or low sugar symptoms. Pt states overall good compliance with treatment and medications, good tolerability, and has been trying to follow appropriate diet.  Pt denies worsening depressive symptoms, suicidal ideation or panic. No fever, night sweats, wt loss, loss of appetite, or other constitutional symptoms.  Pt states good ability with ADL's, has low fall risk, home safety reviewed and adequate, no other significant changes in hearing or vision, and only occasionally active with exercise. Pt believes having a CT chest soon per pulm but not sure of the details.    Does also have SSCP tightness intermittent over the past month without worsening assoc symptoms, diaphoresis, and not clearly exertional, pleuritic or positional.  Asks for card referral.  ECG done today Also describes a mid thoracic  Arthritic recurring pain, spends much of her days in bed, worse to do this, with electric pain radiating about t4 level it seems to the left chest and anterior chest Past Medical History:  Diagnosis Date  . Allergic rhinitis   . Allergic rhinitis, cause unspecified 09/21/2011  . Arthritis   . Asthma   . Asthma 09/21/2011  . Colon polyps   . COPD (chronic obstructive pulmonary disease) (HCC)    mild   . Hyperlipidemia   . UTI (lower urinary tract infection)    Past Surgical History:  Procedure Laterality Date  . DENTAL SURGERY    . pilonidial cyst    . TONSILLECTOMY      reports that she quit smoking about 15 months ago. Her smoking use included Cigarettes. She smoked 1.00 pack per  day. She has never used smokeless tobacco. She reports that she drinks alcohol. She reports that she does not use drugs. family history includes Alcohol abuse in her other; Arthritis in her other; Heart disease in her other; Hypertension in her other; Stroke in her other. Allergies  Allergen Reactions  . Lipitor [Atorvastatin]     myalgias  . Sulfa Antibiotics    Current Outpatient Prescriptions on File Prior to Visit  Medication Sig Dispense Refill  . albuterol (PROVENTIL) (2.5 MG/3ML) 0.083% nebulizer solution Take 3 mLs (2.5 mg total) by nebulization every 6 (six) hours as needed for wheezing or shortness of breath. DX J44.9 75 mL 12  . ALPRAZolam (XANAX) 0.25 MG tablet TAKE (1) TABLET TWICE A DAY AS NEEDED. 60 tablet 5  . bacitracin ointment Apply 1 application topically 2 (two) times daily. 120 g 0  . FLUoxetine (PROZAC) 40 MG capsule Take 1 capsule (40 mg total) by mouth daily. 90 capsule 3  . pantoprazole (PROTONIX) 40 MG tablet Take 1 tablet (40 mg total) by mouth daily. 90 tablet 3  . predniSONE (STERAPRED UNI-PAK 21 TAB) 10 MG (21) TBPK tablet Take 1 tablet (10 mg total) by mouth daily. Take 6 tabs by mouth daily  for 2 days, then 5 tabs for 2 days, then 4 tabs for 2 days, then 3 tabs for 2 days, 2 tabs for 2 days, then 1 tab by mouth daily for 2 days 42 tablet 0  .  rosuvastatin (CRESTOR) 20 MG tablet TAKE 1 TABLET ONCE DAILY. 30 tablet 0  . SPIRIVA RESPIMAT 2.5 MCG/ACT AERS USE 1 PUFF DAILY AS DIRECTED 4 g 5  . sulfamethoxazole-trimethoprim (BACTRIM DS,SEPTRA DS) 800-160 MG tablet Take 1 tablet by mouth 2 (two) times daily. 14 tablet 0  . SYMBICORT 160-4.5 MCG/ACT inhaler USE 2 INHALATIONS TWICE DAILY 10.2 g 5  . VENTOLIN HFA 108 (90 Base) MCG/ACT inhaler USE 2 PUFFS EVERY FOUR HOURS AS NEEDED FOR WHEEZING OR SHORTNESS OF BREATH. 18 g 2  . cephALEXin (KEFLEX) 500 MG capsule Take 1 capsule (500 mg total) by mouth 4 (four) times daily. (Patient not taking: Reported on 07/20/2016) 28  capsule 0  . clonazePAM (KLONOPIN) 1 MG tablet TAKE 1 TABLET AT BEDTIME AS NEEDED. (Patient not taking: Reported on 07/20/2016) 30 tablet 5   No current facility-administered medications on file prior to visit.     Wt Readings from Last 3 Encounters:  07/20/16 136 lb (61.7 kg)  06/20/16 135 lb 9.6 oz (61.5 kg)  06/17/16 140 lb (63.5 kg)   Review of Systems Constitutional: Negative for increased diaphoresis, or other activity, appetite or siginficant weight change other than noted HENT: Negative for worsening hearing loss, ear pain, facial swelling, mouth sores and neck stiffness.   Eyes: Negative for other worsening pain, redness or visual disturbance.  Respiratory: Negative for choking or stridor Cardiovascular: Negative for other chest pain and palpitations.  Gastrointestinal: Negative for worsening diarrhea, blood in stool, or abdominal distention Genitourinary: Negative for hematuria, flank pain or change in urine volume.  Musculoskeletal: Negative for myalgias or other joint complaints.  Skin: Negative for other color change and wound or drainage.  Neurological: Negative for syncope and numbness. other than noted Hematological: Negative for adenopathy. or other swelling Psychiatric/Behavioral: Negative for hallucinations, SI, self-injury, decreased concentration or other worsening agitation.  All other system neg per pt    Objective:   Physical Exam BP 128/80   Pulse (!) 104   Temp 97.6 F (36.4 C) (Oral)   Resp 20   Wt 136 lb (61.7 kg)   SpO2 92%   BMI 23.34 kg/m  VS noted, chronic mild breathless to speak longer sentences, no accessory muscle use Constitutional: Pt is oriented to person, place, and time. Appears well-developed and well-nourished, in no significant distress Head: Normocephalic and atraumatic  Eyes: Conjunctivae and EOM are normal. Pupils are equal, round, and reactive to light Right Ear: External ear normal.  Left Ear: External ear normal Nose: Nose  normal.  Mouth/Throat: Oropharynx is clear and moist  Neck: Normal range of motion. Neck supple. No JVD present. No tracheal deviation present or significant neck LA or mass Cardiovascular: Normal rate, regular rhythm, normal heart sounds and intact distal pulses.   Pulmonary/Chest: Effort normal and breath sounds decreased without rales or wheezing  Abdominal: Soft. Bowel sounds are normal. NT. No HSM  Musculoskeletal: Normal range of motion. Exhibits no edema Lymphadenopathy: Has no cervical adenopathy.  Neurological: Pt is alert and oriented to person, place, and time. Pt has normal reflexes. No cranial nerve deficit. Motor grossly intact Skin: Skin is warm and dry. No rash noted or new ulcers Psychiatric:  Has mild nervous mood and affect. Behavior is normal.  No other new exam findings  ECG today I have personally interpreted Sinus  Rhythm  -Left atrial enlargement.   -  Negative precordial T-waves  -Probably normal    Assessment & Plan:

## 2016-07-20 NOTE — Assessment & Plan Note (Signed)
Asympt, minor, cont to montior

## 2016-07-20 NOTE — Progress Notes (Signed)
Pre visit review using our clinic review tool, if applicable. No additional management support is needed unless otherwise documented below in the visit note. 

## 2016-07-24 ENCOUNTER — Ambulatory Visit (INDEPENDENT_AMBULATORY_CARE_PROVIDER_SITE_OTHER): Payer: Medicare HMO | Admitting: Pulmonary Disease

## 2016-07-24 ENCOUNTER — Encounter: Payer: Self-pay | Admitting: Pulmonary Disease

## 2016-07-24 VITALS — BP 122/64 | HR 85 | Ht 64.0 in | Wt 139.0 lb

## 2016-07-24 DIAGNOSIS — R0600 Dyspnea, unspecified: Secondary | ICD-10-CM

## 2016-07-24 DIAGNOSIS — R0689 Other abnormalities of breathing: Secondary | ICD-10-CM

## 2016-07-24 MED ORDER — ALBUTEROL SULFATE HFA 108 (90 BASE) MCG/ACT IN AERS: INHALATION_SPRAY | RESPIRATORY_TRACT | 2 refills | Status: DC

## 2016-07-24 MED ORDER — ALBUTEROL SULFATE (2.5 MG/3ML) 0.083% IN NEBU: 2.5000 mg | INHALATION_SOLUTION | Freq: Four times a day (QID) | RESPIRATORY_TRACT | 12 refills | Status: DC | PRN

## 2016-07-24 NOTE — Patient Instructions (Signed)
Continue using the Symbicort and Spiriva. We will reorder your Proventil and order albuterol nebs to be used every 6 hours as needed.  Follow up in clinic in March after PFTs

## 2016-07-24 NOTE — Progress Notes (Signed)
Andrea Larson    [id number]    31-Mar-1946  Primary Care Physician:Andrea Andrea Reichmann, MD  Referring Physician: Biagio Borg, MD [address] [place], [place] [address]  Chief complaint:  Consult for dyspnea.  HPI: Andrea Larson is a 71 year old with past medical history of COPD, asthma, hyperlipidemia. She has complains of dyspnea on exertion and at rest and occasional cough with sputum production, wheezing. She has symptoms every day throughout the year and at least 2 exacerbations per year requiring prednisone and antibiotic taper. She is currently on Symbicort and Spiriva. She has been on these medications for several years and she feels that helped with her symptoms. She is also using her albuterol rescue inhaler at least 2 times per day. She denies any nighttime awakenings She was seen in the ED on 06/17/16 for alcohol intoxication, COPD exacerbation. She was given Keflex for UTI and a prednisone taper. She feels that her symptoms are much better while on the prednisone. She has 4 days of the taper left.  She has 60-pack-year smoking history, quit in October 2016. 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  Interim History: She was switched to Trelegy last week but feels that this did not help with her breathing. She is now back on Symbicort and Spiriva. She is also getting nebulizers which helps with her breathing. However she does not recall the name of the nebulizer.  Outpatient Encounter Prescriptions as of 07/24/2016  Medication Sig  . albuterol (PROVENTIL) (2.5 MG/3ML) 0.083% nebulizer solution Take 3 mLs (2.5 mg total) by nebulization every 6 (six) hours as needed for wheezing or shortness of breath. DX J44.9  . ALPRAZolam (XANAX) 0.25 MG tablet TAKE (1) TABLET TWICE A DAY AS NEEDED.  . bacitracin ointment Apply 1 application topically 2 (two) times daily.  Marland Kitchen FLUoxetine (PROZAC) 40 MG capsule  Take 1 capsule (40 mg total) by mouth daily.  Marland Kitchen gabapentin (NEURONTIN) 100 MG capsule Take 1 capsule (100 mg total) by mouth 3 (three) times daily.  . pantoprazole (PROTONIX) 40 MG tablet Take 1 tablet (40 mg total) by mouth daily.  . rosuvastatin (CRESTOR) 20 MG tablet TAKE 1 TABLET ONCE DAILY.  Marland Kitchen SPIRIVA RESPIMAT 2.5 MCG/ACT AERS USE 1 PUFF DAILY AS DIRECTED  . SYMBICORT 160-4.5 MCG/ACT inhaler USE 2 INHALATIONS TWICE DAILY  . VENTOLIN HFA 108 (90 Base) MCG/ACT inhaler USE 2 PUFFS EVERY FOUR HOURS AS NEEDED FOR WHEEZING OR SHORTNESS OF BREATH.  . [DISCONTINUED] cephALEXin (KEFLEX) 500 MG capsule Take 1 capsule (500 mg total) by mouth 4 (four) times daily. (Patient not taking: Reported on 07/20/2016)  . [DISCONTINUED] clonazePAM (KLONOPIN) 1 MG tablet TAKE 1 TABLET AT BEDTIME AS NEEDED. (Patient not taking: Reported on 07/20/2016)  . [DISCONTINUED] predniSONE (STERAPRED UNI-PAK 21 TAB) 10 MG (21) TBPK tablet Take 1 tablet (10 mg total) by mouth daily. Take 6 tabs by mouth daily  for 2 days, then 5 tabs for 2 days, then 4 tabs for 2 days, then 3 tabs for 2 days, 2 tabs for 2 days, then 1 tab by mouth daily for 2 days  . [DISCONTINUED] sulfamethoxazole-trimethoprim (BACTRIM DS,SEPTRA DS) 800-160 MG tablet Take 1 tablet by mouth 2 (two) times daily.   No facility-administered encounter medications on file as of 07/24/2016.     Allergies as of 07/24/2016 - Review Complete 07/24/2016  Allergen Reaction Noted  . Lipitor [atorvastatin]  10/28/2012  . Sulfa antibiotics  08/20/2011    Past Medical History:  Diagnosis Date  . Allergic rhinitis   . Allergic rhinitis, cause unspecified 09/21/2011  . Arthritis   . Asthma   . Asthma 09/21/2011  . Colon polyps   . COPD (chronic obstructive pulmonary disease) (HCC)    mild   . Hyperlipidemia   . UTI (lower urinary tract infection)     Past Surgical History:  Procedure Laterality Date  . DENTAL SURGERY    . pilonidial cyst    . TONSILLECTOMY       Family History  Problem Relation Age of Onset  . Alcohol abuse Other   . Arthritis Other   . Heart disease Other   . Stroke Other   . Hypertension Other   . Colon cancer Neg Hx     Social History   Social History  . Marital status: Divorced    Spouse name: N/A  . Number of children: N/A  . Years of education: N/A   Occupational History  . Not on file.   Social History Main Topics  . Smoking status: Former Smoker    Packs/day: 1.00    Types: Cigarettes    Quit date: 04/18/2015  . Smokeless tobacco: Never Used  . Alcohol use 0.0 oz/week     Comment: occ.   . Drug use: No  . Sexual activity: Not on file   Other Topics Concern  . Not on file   Social History Narrative  . No narrative on file   Review of systems: Review of Systems  Constitutional: Negative for fever and chills.  HENT: Negative.   Eyes: Negative for blurred vision.  Respiratory: as per HPI  Cardiovascular: Negative for chest pain and palpitations.  Gastrointestinal: Negative for vomiting, diarrhea, blood per rectum. Genitourinary: Negative for dysuria, urgency, frequency and hematuria.  Musculoskeletal: Negative for myalgias, back pain and joint pain.  Skin: Negative for itching and rash.  Neurological: Negative for dizziness, tremors, focal weakness, seizures and loss of consciousness.  Endo/Heme/Allergies: Negative for environmental allergies.  Psychiatric/Behavioral: Negative for depression, suicidal ideas and hallucinations.  All other systems reviewed and are negative.  Physical Exam: Blood pressure 120/72, pulse 90, height 5\' 4"  (1.626 m), weight 135 lb 9.6 oz (61.5 kg), SpO2 95 %. Gen:      No acute distress HEENT:  EOMI, sclera anicteric Neck:     No masses; no thyromegaly Lungs:    Clear to auscultation bilaterally; normal respiratory effort CV:         Regular rate and rhythm; no murmurs Abd:      + bowel sounds; soft, non-tender; no palpable masses, no distension Ext:    No  edema; adequate peripheral perfusion Skin:      Warm and dry; no rash Neuro: alert and oriented x 3 Psych: normal mood and affect  Data Reviewed: CXR 06/17/16- No acute cardiopulmonary abnormality. Images reviewed  Assessment:  #1 Evaluation for dyspnea. She likely has COPD based on her smoking history, presentation. She is now off the prednisone taper. She is back to Symbicort and Spiriva as she feels that this is helping better than the Trelegy. She is scheduled for pulmonary function tests in march and will continue on the nebulizer PRN. She is a good candidate for the Danerexin COPD study. We'll get consent after the PFTs confirm COPD.  #2 Ex smoker Refered to lung cancer screening program  Plan/Recommendations: -  Continue symbicort, spiriva. - Continue Proventil and albuterol nebs PRN   Marshell Garfinkel MD [facility] Pager [phone] 07/24/2016, 4:01 PM  CC: Andrea Borg, MD

## 2016-07-30 ENCOUNTER — Telehealth: Payer: Self-pay | Admitting: Emergency Medicine

## 2016-07-30 NOTE — Telephone Encounter (Signed)
Pt called and stated she was suppose to call and let you know if the medication gabapentin (NEURONTIN) 100 MG capsule worked. She said it did and thank you.

## 2016-07-31 ENCOUNTER — Other Ambulatory Visit: Payer: Self-pay | Admitting: Internal Medicine

## 2016-08-03 ENCOUNTER — Encounter: Payer: Self-pay | Admitting: *Deleted

## 2016-08-03 ENCOUNTER — Other Ambulatory Visit: Payer: Self-pay | Admitting: Acute Care

## 2016-08-03 DIAGNOSIS — Z87891 Personal history of nicotine dependence: Secondary | ICD-10-CM

## 2016-08-13 ENCOUNTER — Encounter: Payer: Self-pay | Admitting: Acute Care

## 2016-08-13 ENCOUNTER — Ambulatory Visit (INDEPENDENT_AMBULATORY_CARE_PROVIDER_SITE_OTHER): Payer: Medicare HMO | Admitting: Acute Care

## 2016-08-13 ENCOUNTER — Ambulatory Visit (INDEPENDENT_AMBULATORY_CARE_PROVIDER_SITE_OTHER)
Admission: RE | Admit: 2016-08-13 | Discharge: 2016-08-13 | Disposition: A | Discharge status: Home / Self Care | Payer: Medicare HMO | Source: Ambulatory Visit | Attending: Acute Care | Admitting: Acute Care

## 2016-08-13 DIAGNOSIS — Z87891 Personal history of nicotine dependence: Secondary | ICD-10-CM

## 2016-08-13 NOTE — Progress Notes (Signed)
Shared Decision Making Visit Lung Cancer Screening Program [phone])   Eligibility:  Age 71 y.o.  Pack Years Smoking History Calculation 41.25-pack-year smoking history (# packs/per year x # years smoked)  Recent History of coughing up blood  no  Unexplained weight loss? no ( >Than 15 pounds within the last 6 months )  Prior History Lung / other cancer no (Diagnosis within the last 5 years already requiring surveillance chest CT Scans).  Smoking Status Former Smoker  Former Smokers: Years since quit: 1.5 years  Quit Date: 04/2015  Visit Components:  Discussion included one or more decision making aids. yes  Discussion included risk/benefits of screening. yes  Discussion included potential follow up diagnostic testing for abnormal scans. yes  Discussion included meaning and risk of over diagnosis. yes  Discussion included meaning and risk of False Positives. yes  Discussion included meaning of total radiation exposure. yes  Counseling Included:  Importance of adherence to annual lung cancer LDCT screening. yes  Impact of comorbidities on ability to participate in the program. yes  Ability and willingness to under diagnostic treatment. yes  Smoking Cessation Counseling:  Current Smokers:   Discussed importance of smoking cessation. NA  Information about tobacco cessation classes and interventions provided to patient. NA  Patient provided with "ticket" for LDCT Scan. yes  Symptomatic Patient. no  Counseling  Diagnosis Code: Tobacco Use Z72.0  Asymptomatic Patient yes  Counseling (Intermediate counseling: > three minutes counseling) [id number]  Former Smokers:   Discussed the importance of maintaining cigarette abstinence. yes  Diagnosis Code: Personal History of Nicotine Dependence. [id number]  Information about tobacco cessation classes and interventions provided to patient. Yes  Patient provided with "ticket" for LDCT Scan. yes  Written Order for Lung  Cancer Screening with LDCT placed in Epic. Yes (CT Chest Lung Cancer Screening Low Dose W/O CM) [id number] Z12.2-Screening of respiratory organs Z87.891-Personal history of nicotine dependence  I spent 20 minutes of face to face time with Andrea Larson discussing the risks and benefits of lung cancer screening. We viewed a power point together that explained in detail the above noted topics. We took the time to pause the power point at intervals to allow for questions to be asked and answered to ensure understanding. We discussed that she had taken the single most powerful action possible to decrease her risk of developing lung cancer when she quit smoking. I counseled her to remain smoke free, and to contact me if she ever had the desire to smoke again so that I can provide resources and tools to help support the effort to remain smoke free. We discussed the time and location of the scan, and that either [place] or I will call with the results within  24-48 hours of receiving them. Andrea Larson has my card and contact information in the event she needs to speak with me, in addition to a copy of the power point we reviewed as a resource. Andrea Larson verbalized understanding of all of the above and had no further questions upon leaving the office.   I spent 4 minutes counseling this patient on maintaining smoking abstinence.   Andrea Spatz, NP 08/13/2016

## 2016-08-14 ENCOUNTER — Telehealth: Payer: Self-pay | Admitting: Acute Care

## 2016-08-14 DIAGNOSIS — Z87891 Personal history of nicotine dependence: Secondary | ICD-10-CM

## 2016-08-14 NOTE — Telephone Encounter (Signed)
I have called the results of Ms. Sarabelle Browne's low dose screening CT to her. I explained that her scan was read as a lung RADS 1, indicating a negative study: no nodules or definitely benign nodules. Radiology recommendation is for a repeat LDCT in 12 months. I explained that we will order and schedule the scan for February 2019. I also explained that there was an incidental finding of coronary artery atherosclerosis and aortic atherosclerosis. Additionally there was a finding of aortic valvular calcifications. Recommendation per radiology was to consider a cardiac echocardiogram to evaluate for valvular dysfunction. Mrs. Archibald explained to me that she has an appointment with Dr. Oval Linsey, cardiologist, 08/23/2016 at 2:30 PM. She has requested that I forward the results of the scan to Dr. Oval Linsey to assist in determining her plan of care. I explained to her that I will also send a copy of the scan to Dr. Cathlean Cower. Patient verbalized understanding of the above and had no further questions at completion of the call.

## 2016-08-23 ENCOUNTER — Encounter: Payer: Self-pay | Admitting: Cardiovascular Disease

## 2016-08-23 ENCOUNTER — Ambulatory Visit (INDEPENDENT_AMBULATORY_CARE_PROVIDER_SITE_OTHER): Payer: Medicare HMO | Admitting: Cardiovascular Disease

## 2016-08-23 VITALS — BP 161/94 | HR 99 | Ht 62.0 in | Wt 135.8 lb

## 2016-08-23 DIAGNOSIS — R079 Chest pain, unspecified: Secondary | ICD-10-CM

## 2016-08-23 DIAGNOSIS — I350 Nonrheumatic aortic (valve) stenosis: Secondary | ICD-10-CM

## 2016-08-23 DIAGNOSIS — R0602 Shortness of breath: Secondary | ICD-10-CM | POA: Diagnosis not present

## 2016-08-23 DIAGNOSIS — I2510 Atherosclerotic heart disease of native coronary artery without angina pectoris: Secondary | ICD-10-CM | POA: Diagnosis not present

## 2016-08-23 DIAGNOSIS — I2584 Coronary atherosclerosis due to calcified coronary lesion: Secondary | ICD-10-CM | POA: Diagnosis not present

## 2016-08-23 HISTORY — DX: Nonrheumatic aortic (valve) stenosis: I35.0

## 2016-08-23 HISTORY — DX: Atherosclerotic heart disease of native coronary artery without angina pectoris: I25.10

## 2016-08-23 NOTE — Progress Notes (Signed)
Cardiology Office Note   Date:  08/23/2016   ID:  Andrea Larson, DOB 1946-01-09, MRN [id number]  PCP:  Cathlean Cower, MD  Cardiologist:   Skeet Latch, MD   Chief Complaint  Patient presents with  . New Patient (Initial Visit)  . Chest Pain    chest discomfort.      History of Present Illness: Andrea Larson is a 71 y.o. female with hyperlipidemia, COPD and syncope  who presents for an evaluation of coronary calcification noted on CT.  [name] had a screening CT scan on 08/14/16 and was noted to have atherosclerosis of the coronary arteries, aorta and aortic valve.  6 months ago she experienced intermittent episodes of left-sided chest pressure. It always occurred when laying down. She has chronic shortness of breath attributable to COPD. It is been stable, and initially has improved since she started seeing pulmonary was started on inhalers. She hasn't noted any lower extremity edema, orthopnea, or PND. Her chest pain improved after she was started on gabapentin. She does not get any exercise due to her shortness of breath. She denies palpitations, lightheadedness, or dizziness. She did experience one fall 05/2016 that occurred when walking up her driveway. She reports that it was a mechanical fall.  She quit smoking over a year ago.   Past Medical History:  Diagnosis Date  . Allergic rhinitis   . Allergic rhinitis, cause unspecified 09/21/2011  . Aortic stenosis 08/23/2016  . Arthritis   . Asthma   . Asthma 09/21/2011  . Colon polyps   . COPD (chronic obstructive pulmonary disease) (HCC)    mild   . Coronary artery calcification 08/23/2016  . Hyperlipidemia   . UTI (lower urinary tract infection)     Past Surgical History:  Procedure Laterality Date  . DENTAL SURGERY    . pilonidial cyst    . TONSILLECTOMY       Current Outpatient Prescriptions  Medication Sig Dispense Refill  . albuterol (PROVENTIL) (2.5 MG/3ML) 0.083% nebulizer solution Take 3 mLs (2.5 mg  total) by nebulization every 6 (six) hours as needed for wheezing or shortness of breath. DX J44.9 75 mL 12  . albuterol (VENTOLIN HFA) 108 (90 Base) MCG/ACT inhaler USE 2 PUFFS EVERY FOUR HOURS AS NEEDED FOR WHEEZING OR SHORTNESS OF BREATH. 18 g 2  . ALPRAZolam (XANAX) 0.25 MG tablet TAKE (1) TABLET TWICE A DAY AS NEEDED. 60 tablet 5  . bacitracin ointment Apply 1 application topically 2 (two) times daily. 120 g 0  . FLUoxetine (PROZAC) 40 MG capsule Take 1 capsule (40 mg total) by mouth daily. 90 capsule 3  . gabapentin (NEURONTIN) 100 MG capsule Take 1 capsule (100 mg total) by mouth 3 (three) times daily. 90 capsule 5  . pantoprazole (PROTONIX) 40 MG tablet Take 1 tablet (40 mg total) by mouth daily. 90 tablet 3  . rosuvastatin (CRESTOR) 20 MG tablet TAKE 1 TABLET ONCE DAILY. 30 tablet 0  . SPIRIVA RESPIMAT 2.5 MCG/ACT AERS USE 1 PUFF DAILY AS DIRECTED 4 g 5  . SYMBICORT 160-4.5 MCG/ACT inhaler USE 2 INHALATIONS TWICE DAILY 10.2 g 5   No current facility-administered medications for this visit.     Allergies:   Lipitor [atorvastatin] and Sulfa antibiotics    Social History:  The patient  reports that she quit smoking about 16 months ago. Her smoking use included Cigarettes. She has a 41.25 pack-year smoking history. She has never used smokeless tobacco. She reports that she drinks  alcohol. She reports that she does not use drugs.   Family History:  The patient's family history includes Alcohol abuse in her other; Arthritis in her other; Atrial fibrillation in her brother; Heart disease in her other; Hypertension in her father and other; Stroke in her father and other.    ROS:  Please see the history of present illness.   Otherwise, review of systems are positive for none.   All other systems are reviewed and negative.    PHYSICAL EXAM: VS:  BP (!) 161/94   Pulse 99   Ht 5\' 2"  (1.575 m)   Wt 61.6 kg (135 lb 12.8 oz)   BMI 24.84 kg/m  , BMI Body mass index is 24.84 kg/m. GENERAL:   Well appearing HEENT:  Pupils equal round and reactive, fundi not visualized, oral mucosa unremarkable NECK:  No jugular venous distention, waveform within normal limits, carotid upstroke brisk and symmetric, no bruits, no thyromegaly LYMPHATICS:  No cervical adenopathy LUNGS:  Clear to auscultation bilaterally HEART:  Distant heart sounds.  RRR.  PMI not displaced or sustained,S1 and S2 within normal limits, no S3, no S4, no clicks, no rubs, III/VI mid to late peaking systolic murmur at the LUSB ABD:  Flat, positive bowel sounds normal in frequency in pitch, no bruits, no rebound, no guarding, no midline pulsatile mass, no hepatomegaly, no splenomegaly EXT:  2 plus pulses throughout, no edema, no cyanosis no clubbing SKIN:  No rashes no nodules NEURO:  Cranial nerves II through XII grossly intact, motor grossly intact throughout PSYCH:  Cognitively intact, oriented to person place and time    EKG:  EKG is ordered today. The ekg ordered 07/20/16 demonstrates sinus rhythm.  Rate 93 bpm.    Recent Labs: 07/18/2016: ALT 13; BUN 10; Creatinine, Ser 1.02; Hemoglobin 14.4; Platelets 381.0; Potassium 4.7; Sodium 138; TSH 1.68    Lipid Panel    Component Value Date/Time   CHOL 218 (H) 07/18/2016 1330   TRIG 160.0 (H) 07/18/2016 1330   TRIG 156 (H) 04/24/2006 1606   HDL 79.00 07/18/2016 1330   CHOLHDL 3 07/18/2016 1330   VLDL 32.0 07/18/2016 1330   LDLCALC 107 (H) 07/18/2016 1330   LDLDIRECT 124.8 09/21/2011 1051      Wt Readings from Last 3 Encounters:  08/23/16 61.6 kg (135 lb 12.8 oz)  07/24/16 63 kg (139 lb)  07/20/16 61.7 kg (136 lb)      ASSESSMENT AND PLAN:  # Coronary calcification: [name] was noted to have coronary calcifications on CT scan. She previously experienced episodes of atypical chest pain that improved with gabapentin. I suspect that this is not due to ischemia. However, she does not get any exercise and therefore we do not know if she has exertional symptoms.  We will obtain a Lexiscan Myoview to better assess.  We will start aspirin 81 mg daily.  # Aortic valve calcification: [name] was noted to have calcification of aortic valve on CT scan. She also has a systolic murmur consistent with moderate aortic stenosis.  We will obtain an echo to assess.   # Hyperlipidemia: Lipids not at goal.  She notes that she had not been taking her rosuvastatin particularly when they were last checked. Repeat in one month and if her LDL remains greater than 70 we will increase her dose to 40 mg daily.  # Hypertension: BP above goal.  On repeat it was 134/74.  In general and has been well-controlled. We will not make any changes  and continue to monitor for now.    Current medicines are reviewed at length with the patient today.  The patient does not have concerns regarding medicines.  The following changes have been made:  Start aspirin 81 mg daily   Labs/ tests ordered today include  Orders Placed This Encounter  Procedures  . Myocardial Perfusion Imaging  . ECHOCARDIOGRAM COMPLETE     Disposition:   FU with Elliot Simoneaux C. Oval Linsey, MD, [facility] in 1 month   This note was written with the assistance of speech recognition software.  Please excuse any transcriptional errors.  Signed, Harshal Sirmon C. Oval Linsey, MD, [facility]  08/23/2016 3:07 PM    [facility]

## 2016-08-23 NOTE — Patient Instructions (Signed)
Medication Instructions:  Your physician recommends that you continue on your current medications as directed. Please refer to the Current Medication list given to you today.  Labwork: none  Testing/Procedures: Your physician has requested that you have an echocardiogram. Echocardiography is a painless test that uses sound waves to create images of your heart. It provides your doctor with information about the size and shape of your heart and how well your heart's chambers and valves are working. This procedure takes approximately one hour. There are no restrictions for this procedure.  Your physician has requested that you have a lexiscan myoview. For further information please visit [email or web address]. Please follow instruction sheet, as given.  Follow-Up: Your physician recommends that you schedule a follow-up appointment in: 4-6 weeks   If you need a refill on your cardiac medications before your next appointment, please call your pharmacy.

## 2016-08-29 ENCOUNTER — Telehealth (HOSPITAL_COMMUNITY): Payer: Self-pay

## 2016-08-29 NOTE — Telephone Encounter (Signed)
Encounter complete. 

## 2016-08-30 ENCOUNTER — Ambulatory Visit (HOSPITAL_COMMUNITY)
Admission: RE | Admit: 2016-08-30 | Discharge: 2016-08-30 | Disposition: A | Discharge status: Home / Self Care | Payer: Medicare HMO | Source: Ambulatory Visit | Attending: Cardiovascular Disease | Admitting: Cardiovascular Disease

## 2016-08-30 DIAGNOSIS — R079 Chest pain, unspecified: Secondary | ICD-10-CM | POA: Diagnosis not present

## 2016-08-30 DIAGNOSIS — R0602 Shortness of breath: Secondary | ICD-10-CM

## 2016-08-30 LAB — MYOCARDIAL PERFUSION IMAGING
LV dias vol: 63 mL (ref 46–106)
LV sys vol: 23 mL
Peak HR: 100 {beats}/min
Rest HR: 84 {beats}/min
SDS: 2
SRS: 6
SSS: 8
TID: 1.16

## 2016-08-30 MED ORDER — TECHNETIUM TC 99M TETROFOSMIN IV KIT
30.8000 | PACK | Freq: Once | INTRAVENOUS | Status: AC | PRN
  Administered 2016-08-30: 30.8 via INTRAVENOUS
  Filled 2016-08-30: qty 31

## 2016-08-30 MED ORDER — REGADENOSON 0.4 MG/5ML IV SOLN
0.4000 mg | Freq: Once | INTRAVENOUS | Status: AC
  Administered 2016-08-30: 0.4 mg via INTRAVENOUS

## 2016-08-30 MED ORDER — TECHNETIUM TC 99M TETROFOSMIN IV KIT
9.9000 | PACK | Freq: Once | INTRAVENOUS | Status: AC | PRN
  Administered 2016-08-30: 9.9 via INTRAVENOUS
  Filled 2016-08-30: qty 10

## 2016-09-03 ENCOUNTER — Other Ambulatory Visit: Payer: Self-pay | Admitting: Internal Medicine

## 2016-09-06 ENCOUNTER — Other Ambulatory Visit: Payer: Self-pay | Admitting: Internal Medicine

## 2016-09-06 ENCOUNTER — Ambulatory Visit: Payer: Medicare Other | Admitting: Pulmonary Disease

## 2016-09-06 ENCOUNTER — Ambulatory Visit (INDEPENDENT_AMBULATORY_CARE_PROVIDER_SITE_OTHER): Payer: Medicare HMO | Admitting: Pulmonary Disease

## 2016-09-06 DIAGNOSIS — R0689 Other abnormalities of breathing: Secondary | ICD-10-CM | POA: Diagnosis not present

## 2016-09-06 DIAGNOSIS — R0600 Dyspnea, unspecified: Secondary | ICD-10-CM

## 2016-09-06 DIAGNOSIS — J449 Chronic obstructive pulmonary disease, unspecified: Secondary | ICD-10-CM

## 2016-09-06 LAB — PULMONARY FUNCTION TEST
DL/VA % pred: 79 %
DL/VA: 3.5 ml/min/mmHg/L
DLCO cor % pred: 57 %
DLCO cor: 11.56 ml/min/mmHg
DLCO unc % pred: 58 %
DLCO unc: 11.73 ml/min/mmHg
FEF 25-75 Post: 0.42 L/sec
FEF 25-75 Pre: 0.18 L/sec
FEF2575-%Change-Post: 139 %
FEF2575-%Pred-Post: 26 %
FEF2575-%Pred-Pre: 11 %
FEV1-%Change-Post: 39 %
FEV1-%Pred-Post: 36 %
FEV1-%Pred-Pre: 26 %
FEV1-Post: 0.7 L
FEV1-Pre: 0.5 L
FEV1FVC-%Change-Post: 15 %
FEV1FVC-%Pred-Pre: 59 %
FEV6-%Change-Post: 22 %
FEV6-%Pred-Post: 55 %
FEV6-%Pred-Pre: 45 %
FEV6-Post: 1.33 L
FEV6-Pre: 1.08 L
FEV6FVC-%Change-Post: 1 %
FEV6FVC-%Pred-Post: 101 %
FEV6FVC-%Pred-Pre: 100 %
FVC-%Change-Post: 21 %
FVC-%Pred-Post: 54 %
FVC-%Pred-Pre: 44 %
FVC-Post: 1.37 L
FVC-Pre: 1.13 L
Post FEV1/FVC ratio: 51 %
Post FEV6/FVC ratio: 97 %
Pre FEV1/FVC ratio: 44 %
Pre FEV6/FVC Ratio: 96 %
RV % pred: 155 %
RV: 3.26 L
TLC % pred: 110 %
TLC: 5.11 L

## 2016-09-10 ENCOUNTER — Ambulatory Visit (HOSPITAL_COMMUNITY): Payer: Medicare HMO | Attending: Cardiovascular Disease

## 2016-09-10 ENCOUNTER — Other Ambulatory Visit: Payer: Self-pay

## 2016-09-10 DIAGNOSIS — I501 Left ventricular failure: Secondary | ICD-10-CM | POA: Insufficient documentation

## 2016-09-10 DIAGNOSIS — I361 Nonrheumatic tricuspid (valve) insufficiency: Secondary | ICD-10-CM | POA: Diagnosis not present

## 2016-09-10 DIAGNOSIS — R0602 Shortness of breath: Secondary | ICD-10-CM | POA: Diagnosis not present

## 2016-09-10 DIAGNOSIS — R079 Chest pain, unspecified: Secondary | ICD-10-CM | POA: Diagnosis not present

## 2016-09-10 DIAGNOSIS — I352 Nonrheumatic aortic (valve) stenosis with insufficiency: Secondary | ICD-10-CM | POA: Insufficient documentation

## 2016-09-13 ENCOUNTER — Encounter: Payer: Self-pay | Admitting: Cardiovascular Disease

## 2016-09-13 ENCOUNTER — Ambulatory Visit (INDEPENDENT_AMBULATORY_CARE_PROVIDER_SITE_OTHER): Payer: Medicare HMO | Admitting: Cardiovascular Disease

## 2016-09-13 VITALS — BP 151/86 | HR 100 | Ht 61.0 in | Wt 138.2 lb

## 2016-09-13 DIAGNOSIS — E785 Hyperlipidemia, unspecified: Secondary | ICD-10-CM | POA: Diagnosis not present

## 2016-09-13 DIAGNOSIS — I10 Essential (primary) hypertension: Secondary | ICD-10-CM

## 2016-09-13 DIAGNOSIS — I350 Nonrheumatic aortic (valve) stenosis: Secondary | ICD-10-CM | POA: Diagnosis not present

## 2016-09-13 DIAGNOSIS — I259 Chronic ischemic heart disease, unspecified: Secondary | ICD-10-CM

## 2016-09-13 MED ORDER — LOSARTAN POTASSIUM 25 MG PO TABS: 25.0000 mg | ORAL_TABLET | Freq: Every day | ORAL | 6 refills | Status: DC

## 2016-09-13 NOTE — Patient Instructions (Addendum)
Medication Instructions:  START losartan 25mg  (1 tablet) daily.  Labwork: Please return to have FASTING blood work (Lipid panel, CMP) in 1 WEEK.  Follow-Up: Your physician recommends that you schedule a follow-up appointment in: 2-3 weeks with our Pharmacist for BP check.   Your physician wants you to follow-up in: 1 YEAR with DR. [place]. You will receive a reminder letter in the mail two months in advance. If you don't receive a letter, please call our office to schedule the follow-up appointment.   Any Other Special Instructions Will Be Listed Below (If Applicable).   [facility] #: [phone]   If you need a refill on your cardiac medications before your next appointment, please call your pharmacy.

## 2016-09-13 NOTE — Progress Notes (Signed)
Cardiology Office Note   Date:  09/17/2016   ID:  Andrea Larson, DOB 11/06/1945, MRN [id number]  PCP:  Cathlean Cower, MD  Cardiologist:   Skeet Latch, MD   Chief Complaint  Patient presents with  . Follow-up    after echo and lexican.      History of Present Illness: Andrea Larson is a 71 y.o. female with mild aortic stenosis, hyperlipidemia, COPD and syncope  who presents for follow up.  She was initially seen 08/2016 for an evaluation of coronary calcification noted on CT. She had a screening CT scan on 08/14/16 and was noted to have atherosclerosis of the coronary arteries, aorta and aortic valve.  She initially reported shortness of breath and chest pressure.  She was referred for [facility] 08/30/16 that revealed LVEF 64% and no ischemia.  She also had an echocardiogram 09/10/16 that showed LVEF 60-65% with grade 1 diastolic dysfunction and mild aortic stenosis (mean gradient 12 mmHg).  Since her last appointment ms. Perritt has been doing well.  Her shortness of breath has been better since starting inhalers.  Her chest pain improved after she was started on gabapentin. . She hasn't noted any lower extremity edema, orthopnea, or PND. She has been feeling well and has no complaints today.   Past Medical History:  Diagnosis Date  . Allergic rhinitis   . Allergic rhinitis, cause unspecified 09/21/2011  . Aortic stenosis 08/23/2016  . Arthritis   . Asthma   . Asthma 09/21/2011  . Colon polyps   . COPD (chronic obstructive pulmonary disease) (HCC)    mild   . Coronary artery calcification 08/23/2016  . Hyperlipidemia   . UTI (lower urinary tract infection)     Past Surgical History:  Procedure Laterality Date  . DENTAL SURGERY    . pilonidial cyst    . TONSILLECTOMY       Current Outpatient Prescriptions  Medication Sig Dispense Refill  . albuterol (PROVENTIL) (2.5 MG/3ML) 0.083% nebulizer solution Take 3 mLs (2.5 mg total) by nebulization every 6 (six) hours  as needed for wheezing or shortness of breath. DX J44.9 75 mL 12  . albuterol (VENTOLIN HFA) 108 (90 Base) MCG/ACT inhaler USE 2 PUFFS EVERY FOUR HOURS AS NEEDED FOR WHEEZING OR SHORTNESS OF BREATH. 18 g 2  . ALPRAZolam (XANAX) 0.25 MG tablet TAKE (1) TABLET TWICE A DAY AS NEEDED. 60 tablet 5  . FLUoxetine (PROZAC) 40 MG capsule TAKE (1) CAPSULE DAILY. 90 capsule 3  . gabapentin (NEURONTIN) 100 MG capsule Take 1 capsule (100 mg total) by mouth 3 (three) times daily. 90 capsule 5  . pantoprazole (PROTONIX) 40 MG tablet TAKE 1 TABLET ONCE DAILY. 90 tablet 3  . rosuvastatin (CRESTOR) 20 MG tablet TAKE 1 TABLET ONCE DAILY. 90 tablet 3  . SPIRIVA RESPIMAT 2.5 MCG/ACT AERS USE 1 PUFF DAILY AS DIRECTED 4 g 5  . SYMBICORT 160-4.5 MCG/ACT inhaler USE 2 PUFFS TWICE DAILY. 10.2 g 5  . losartan (COZAAR) 25 MG tablet Take 1 tablet (25 mg total) by mouth daily. 30 tablet 6   No current facility-administered medications for this visit.     Allergies:   Lipitor [atorvastatin] and Sulfa antibiotics    Social History:  The patient  reports that she quit smoking about 17 months ago. Her smoking use included Cigarettes. She has a 41.25 pack-year smoking history. She has never used smokeless tobacco. She reports that she drinks alcohol. She reports that she does not use  drugs.   Family History:  The patient's family history includes Alcohol abuse in her other; Arthritis in her other; Atrial fibrillation in her brother; Heart disease in her other; Hypertension in her father and other; Stroke in her father and other.    ROS:  Please see the history of present illness.   Otherwise, review of systems are positive for none.   All other systems are reviewed and negative.    PHYSICAL EXAM: VS:  BP (!) 151/86   Pulse 100   Ht 5\' 1"  (1.549 m)   Wt 62.7 kg (138 lb 3.2 oz)   BMI 26.11 kg/m  , BMI Body mass index is 26.11 kg/m. GENERAL:  Well appearing HEENT:  Pupils equal round and reactive, fundi not visualized,  oral mucosa unremarkable NECK:  No jugular venous distention, waveform within normal limits, carotid upstroke brisk and symmetric, no bruits LYMPHATICS:  No cervical adenopathy LUNGS:  Clear to auscultation bilaterally HEART:  Distant heart sounds.  RRR.  PMI not displaced or sustained,S1 and S2 within normal limits, no S3, no S4, no clicks, no rubs, III/VI mid to late peaking systolic murmur at the LUSB ABD:  Flat, positive bowel sounds normal in frequency in pitch, no bruits, no rebound, no guarding, no midline pulsatile mass, no hepatomegaly, no splenomegaly EXT:  2 plus pulses throughout, no edema, no cyanosis no clubbing SKIN:  No rashes no nodules NEURO:  Cranial nerves II through XII grossly intact, motor grossly intact throughout PSYCH:  Cognitively intact, oriented to person place and time   EKG:  EKG is ordered today. The ekg ordered 07/20/16 demonstrates sinus rhythm.  Rate 93 bpm.   Echo 09/10/16: Study Conclusions  - Left ventricle: The cavity size was normal. There was mild   concentric hypertrophy. Systolic function was normal. The   estimated ejection fraction was in the range of 60% to 65%. Wall   motion was normal; there were no regional wall motion   abnormalities. Doppler parameters are consistent with   indeterminate left ventricular relaxation (grade 1 diastolic   dysfunction). Doppler parameters are consistent with high   ventricular filling pressure. - Aortic valve: There was mild stenosis. There was trivial   regurgitation. - Mitral valve: Calcified annulus. Transvalvular velocity was   within the normal range. There was no evidence for stenosis.   There was no regurgitation. - Right ventricle: The cavity size was normal. Wall thickness was   normal. Systolic function was normal. - Tricuspid valve: There was mild regurgitation.   Lexiscan Myoview 08/30/16: The left ventricular ejection fraction is normal (55-65%).  Nuclear stress EF: 64%.  There was no  ST segment deviation noted during stress.  The study is normal.  This is a low risk study    Recent Labs: 07/18/2016: ALT 13; BUN 10; Creatinine, Ser 1.02; Hemoglobin 14.4; Platelets 381.0; Potassium 4.7; Sodium 138; TSH 1.68    Lipid Panel    Component Value Date/Time   CHOL 218 (H) 07/18/2016 1330   TRIG 160.0 (H) 07/18/2016 1330   TRIG 156 (H) 04/24/2006 1606   HDL 79.00 07/18/2016 1330   CHOLHDL 3 07/18/2016 1330   VLDL 32.0 07/18/2016 1330   LDLCALC 107 (H) 07/18/2016 1330   LDLDIRECT 124.8 09/21/2011 1051      Wt Readings from Last 3 Encounters:  09/13/16 62.7 kg (138 lb 3.2 oz)  08/30/16 61.2 kg (135 lb)  08/23/16 61.6 kg (135 lb 12.8 oz)      ASSESSMENT AND PLAN:  #  Coronary calcification: Ms. Tift was noted to have coronary calcifications on CT scan. She had atypical chest pain that has resolved.  Lexiscan Myoview was negative for ischemia 08/2016.  Continue aspirin 81 mg daily.  # Mild atrial stenosis: Ms. Kosek is asymptomatic.  Mean gradient 12 mmHg.  Continue to monitor.  # Hyperlipidemia: Lipids not at goal.  She notes that she had not been taking her rosuvastatin particularly when they were last checked.  She has now been taking it regularly.  Check lipids and CMP.  # Hypertension: BP above goal.  On repeat it was 142/84.  Start losartan 25 mg daily.  Check CMP in 1 week when lipids are checked.    Current medicines are reviewed at length with the patient today.  The patient does not have concerns regarding medicines.  The following changes have been made:  Start losartan 25 mg daily Labs/ tests ordered today include  Orders Placed This Encounter  Procedures  . Lipid panel  . Comprehensive metabolic panel     Disposition:   FU with Azad Calame C. Oval Linsey, MD, [facility] in 1 year.  Pharmacy in 2 weeks   This note was written with the assistance of speech recognition software.  Please excuse any transcriptional errors.  Signed, Peterson Mathey C. Oval Linsey,  MD, [facility]  09/17/2016 12:48 AM    [place]

## 2016-09-18 ENCOUNTER — Encounter: Payer: Self-pay | Admitting: Pulmonary Disease

## 2016-09-18 ENCOUNTER — Other Ambulatory Visit: Payer: Self-pay | Admitting: Internal Medicine

## 2016-09-18 ENCOUNTER — Ambulatory Visit (INDEPENDENT_AMBULATORY_CARE_PROVIDER_SITE_OTHER): Payer: Medicare HMO | Admitting: Pulmonary Disease

## 2016-09-18 VITALS — BP 140/70 | HR 103 | Ht 64.0 in | Wt 139.6 lb

## 2016-09-18 DIAGNOSIS — J439 Emphysema, unspecified: Secondary | ICD-10-CM | POA: Diagnosis not present

## 2016-09-18 NOTE — Progress Notes (Signed)
Andrea Larson    [id number]    1946-05-10  Primary Care Physician:James Jenny Reichmann, MD  Referring Physician: Biagio Borg, MD [place], [place] [address]  Chief complaint:   Follow up for COPD GOLD D (CAT score 17, > 2 exacerbations/year)  HPI: Mrs. Facemire is a 71 year old with past medical history of COPD, asthma, hyperlipidemia. She has complains of dyspnea on exertion and at rest and occasional cough with sputum production, wheezing. She has symptoms every day throughout the year and at least 2 exacerbations per year requiring prednisone and antibiotic taper. She is currently on Symbicort and Spiriva. She has been on these medications for several years and she feels that helped with her symptoms. She is also using her albuterol rescue inhaler at least 2 times per day. She denies any nighttime awakenings She was seen in the ED on 06/17/16 for alcohol intoxication, COPD exacerbation. She was given Keflex for UTI and a prednisone taper. She feels that her symptoms are much better while on the prednisone. She has 4 days of the taper left.  She has 60-pack-year smoking history, quit in October 2016. 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   Interim History: She switched from Trelegy to Symbicort and Spiriva. She feels that her breathing is better now. She uses her albuterol nebulizer up to 2 times a day which helps with her breathing. She had a screening CT which did not show any lung nodules. She had coronary artery calcifications and a cardiology follow up with echo, stress test which did not show any worrisome findings.  Outpatient Encounter Prescriptions as of 09/18/2016  Medication Sig  . albuterol (PROVENTIL) (2.5 MG/3ML) 0.083% nebulizer solution Take 3 mLs (2.5 mg total) by nebulization every 6 (six) hours as needed for wheezing or shortness of breath. DX J44.9  . albuterol  (VENTOLIN HFA) 108 (90 Base) MCG/ACT inhaler USE 2 PUFFS EVERY FOUR HOURS AS NEEDED FOR WHEEZING OR SHORTNESS OF BREATH.  Marland Kitchen ALPRAZolam (XANAX) 0.25 MG tablet TAKE (1) TABLET TWICE A DAY AS NEEDED.  Marland Kitchen FLUoxetine (PROZAC) 40 MG capsule TAKE (1) CAPSULE DAILY.  Marland Kitchen gabapentin (NEURONTIN) 100 MG capsule Take 1 capsule (100 mg total) by mouth 3 (three) times daily.  Marland Kitchen losartan (COZAAR) 25 MG tablet Take 1 tablet (25 mg total) by mouth daily.  . pantoprazole (PROTONIX) 40 MG tablet TAKE 1 TABLET ONCE DAILY.  . rosuvastatin (CRESTOR) 20 MG tablet TAKE 1 TABLET ONCE DAILY.  Marland Kitchen SPIRIVA RESPIMAT 2.5 MCG/ACT AERS USE 1 PUFF DAILY AS DIRECTED  . SYMBICORT 160-4.5 MCG/ACT inhaler USE 2 PUFFS TWICE DAILY.   No facility-administered encounter medications on file as of 09/18/2016.     Allergies as of 09/18/2016 - Review Complete 09/18/2016  Allergen Reaction Noted  . Lipitor [atorvastatin]  10/28/2012  . Sulfa antibiotics  08/20/2011    Past Medical History:  Diagnosis Date  . Allergic rhinitis   . Allergic rhinitis, cause unspecified 09/21/2011  . Aortic stenosis 08/23/2016  . Arthritis   . Asthma   . Asthma 09/21/2011  . Colon polyps   . COPD (chronic obstructive pulmonary disease) (HCC)    mild   . Coronary artery calcification 08/23/2016  . Hyperlipidemia   . UTI (lower urinary tract infection)     Past Surgical History:  Procedure Laterality Date  . DENTAL  SURGERY    . pilonidial cyst    . TONSILLECTOMY      Family History  Problem Relation Age of Onset  . Alcohol abuse Other   . Arthritis Other   . Heart disease Other   . Stroke Other   . Hypertension Other   . Hypertension Father   . Stroke Father   . Atrial fibrillation Brother   . Colon cancer Neg Hx     Social History   Social History  . Marital status: Divorced    Spouse name: N/A  . Number of children: N/A  . Years of education: N/A   Occupational History  . Not on file.   Social History Main Topics  . Smoking  status: Former Smoker    Packs/day: 0.75    Years: 55.00    Types: Cigarettes    Quit date: 04/18/2015  . Smokeless tobacco: Never Used  . Alcohol use 0.0 oz/week     Comment: occ.   . Drug use: No  . Sexual activity: Not on file   Other Topics Concern  . Not on file   Social History Narrative  . No narrative on file   Review of systems: Review of Systems  Constitutional: Negative for fever and chills.  HENT: Negative.   Eyes: Negative for blurred vision.  Respiratory: as per HPI  Cardiovascular: Negative for chest pain and palpitations.  Gastrointestinal: Negative for vomiting, diarrhea, blood per rectum. Genitourinary: Negative for dysuria, urgency, frequency and hematuria.  Musculoskeletal: Negative for myalgias, back pain and joint pain.  Skin: Negative for itching and rash.  Neurological: Negative for dizziness, tremors, focal weakness, seizures and loss of consciousness.  Endo/Heme/Allergies: Negative for environmental allergies.  Psychiatric/Behavioral: Negative for depression, suicidal ideas and hallucinations.  All other systems reviewed and are negative.  Physical Exam: Blood pressure 140/70, pulse (!) 103, height 5\' 4"  (1.626 m), weight 139 lb 9.6 oz (63.3 kg), SpO2 95 %. 94% on exertion. Gen:      No acute distress HEENT:  EOMI, sclera anicteric Neck:     No masses; no thyromegaly Lungs:    Clear to auscultation bilaterally; normal respiratory effort CV:         Regular rate and rhythm; no murmurs Abd:      + bowel sounds; soft, non-tender; no palpable masses, no distension Ext:    No edema; adequate peripheral perfusion Skin:      Warm and dry; no rash Neuro: alert and oriented x 3 Psych: normal mood and affect  Data Reviewed: CXR 06/17/16- No acute cardiopulmonary abnormality. I have reviewed the images personally CT chest 08/13/16- Lung-RADS Category 1 I have reviewed all images personally.  PFTs 09/06/16 FVC 1.37 (54%) FEV1 0.70 (36%) F/F 51 TLC  110%  RV/TLC 141% DLCO 58% Severe obstruction with positive bronchodilator response, airtrapping, moderate diffusion defect.  Echo 09/11/15 - Left ventricle: The cavity size was normal. There was mild   concentric hypertrophy. Systolic function was normal. The   estimated ejection fraction was in the range of 60% to 65%. Wall   motion was normal; there were no regional wall motion   abnormalities. Doppler parameters are consistent with   indeterminate left ventricular relaxation (grade 1 diastolic   dysfunction). Doppler parameters are consistent with high   ventricular filling pressure. - Aortic valve: There was mild stenosis. There was trivial   regurgitation. - Mitral valve: Calcified annulus. Transvalvular velocity was   within the normal range. There was no evidence for  stenosis.   There was no regurgitation. - Right ventricle: The cavity size was normal. Wall thickness was   normal. Systolic function was normal. - Tricuspid valve: There was mild regurgitation.  Stress test 08/30/16- Low risk study  Assessment:  #1 COPD GOLD She is back to Symbicort and Spiriva as she feels that this is helping better than the Trelegy and continues on albuterol nebs PRN.  She did not desat on exertion today and will benefit from pulmonary rehab  #2 Ex smoker Screening CT of chest noted with no lung nodules. Follow up in 1 year.   Plan/Recommendations: - Continue symbicort, spiriva. - Continue Proventil and albuterol nebs PRN - Pulmonary rehab - Continue screening CT of chest.  Marshell Garfinkel MD [facility] Pager [phone] 09/18/2016, 4:55 PM  CC: Biagio Borg, MD

## 2016-09-18 NOTE — Telephone Encounter (Signed)
Done hardcopy to Anna  

## 2016-09-18 NOTE — Patient Instructions (Addendum)
We will refer you to pulmonary rehab We will check O2 levels on ambulation Continue symbicort and spiriva  Return in 3 months

## 2016-09-18 NOTE — Telephone Encounter (Signed)
Faxed script back to [facility].../lmb 

## 2016-09-27 ENCOUNTER — Ambulatory Visit (INDEPENDENT_AMBULATORY_CARE_PROVIDER_SITE_OTHER): Payer: Medicare HMO | Admitting: Pharmacist Clinician (PhC)/ Clinical Pharmacy Specialist

## 2016-09-27 ENCOUNTER — Encounter: Payer: Self-pay | Admitting: Pharmacist Clinician (PhC)/ Clinical Pharmacy Specialist

## 2016-09-27 DIAGNOSIS — E785 Hyperlipidemia, unspecified: Secondary | ICD-10-CM

## 2016-09-27 NOTE — Assessment & Plan Note (Addendum)
Today her BP is slightly elevated in the office, but before making any changes in her medication, I would like to see more readings.  She is to check her pressure daily at home and write them on a log.  After 3-4 weeks she will fax this into our office.  Depending on those readings, we can determine if she really needs further drug therapy.  Patient agreeable to this plan.  She is also going to the lab today for BMET.   I believe the elevated heart rate is more related to her rushing to the office (she apparently went to the lab by mistake thinking her appointment was there) and use of her albuterol inhaler in the past 30 minutes.  Will watch this and asked that she include HR readings in her log.

## 2016-09-27 NOTE — Patient Instructions (Signed)
  Your blood pressure today is 144/86  (goal is < 130/80)  Check your blood pressure at home daily and keep record of the readings.  After 3-4 weeks fax your results to [place] at [phone] (attention Erasmo Downer)  Take your BP meds as follows:  Continue with the losartan 25 mg daily  Bring all of your meds, your BP cuff and your record of home blood pressures to your next appointment.  Exercise as you're able, try to walk approximately 30 minutes per day.  Keep salt intake to a minimum, especially watch canned and prepared boxed foods.  Eat more fresh fruits and vegetables and fewer canned items.  Avoid eating in fast food restaurants.    HOW TO TAKE YOUR BLOOD PRESSURE: . Rest 5 minutes before taking your blood pressure. .  Don't smoke or drink caffeinated beverages for at least 30 minutes before. . Take your blood pressure before (not after) you eat. . Sit comfortably with your back supported and both feet on the floor (don't cross your legs). . Elevate your arm to heart level on a table or a desk. . Use the proper sized cuff. It should fit smoothly and snugly around your bare upper arm. There should be enough room to slip a fingertip under the cuff. The bottom edge of the cuff should be 1 inch above the crease of the elbow. . Ideally, take 3 measurements at one sitting and record the average.

## 2016-09-27 NOTE — Progress Notes (Signed)
09/27/2016 Andrea Larson 15-Jul-1945 [id number]   HPI:  Andrea Larson is a 71 y.o. female patient of Dr Oval Linsey, with a [place] below who presents today for hypertension clinic evaluation.  Her cardiac history is significant for coronary calcification, grade 1 diastolic dysfunction and mild aortic stenosis, as well as hypertension.   She also suffers from COPD and asthma.  At her last visit with Dr. Oval Linsey her blood pressure was elevated at 151/86.   Patient states that she has never had hypertension problems in the past, and the losartan 25 mg is the first BP medication she has ever taken.  She bought a new Omron blood pressure cuff about 2 weeks ago, but did not write down any of her home readings.  She reports no dizziness, lightheadedness or shortness of breath.  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  She also used her albuterol inhaler shortly before coming over.  Blood Pressure Goal:  130/80   Current Medications:  Losartan 25 mg qd   Family Hx:  Father hypertension, multiple strokes, pacemaker (died at 79)  Mother - no heart disease (died at 48 - pneumonia and PE)  1 brother - lung cancer, heart disease, still living  3 boys - no known heart issues  Social Hx:  Quit smoking > 1 year, less than 1 ppd for 30+ yeears  coctails with dinner  No coffee, tea, sodas (some diet caffeine free)  Diet:  Eats out regularly, but no added salt; occasionall with cooking  Exercise:  None; started laps around office; allergies keep all exercise indoors  Home BP readings:  New cuff - Omron  67 weeks old; read within 5 points of the office cuff     Wt Readings from Last 3 Encounters:  09/18/16 139 lb 9.6 oz (63.3 kg)  09/13/16 138 lb 3.2 oz (62.7 kg)  08/30/16 135 lb (61.2 kg)   BP Readings from Last 3 Encounters:  09/27/16 (!) 144/82  09/18/16 140/70  09/13/16 (!) 151/86   Pulse  Readings from Last 3 Encounters:  09/27/16 96  09/18/16 (!) 103  09/13/16 100    Current Outpatient Prescriptions  Medication Sig Dispense Refill  . albuterol (PROVENTIL) (2.5 MG/3ML) 0.083% nebulizer solution Take 3 mLs (2.5 mg total) by nebulization every 6 (six) hours as needed for wheezing or shortness of breath. DX J44.9 75 mL 12  . albuterol (VENTOLIN HFA) 108 (90 Base) MCG/ACT inhaler USE 2 PUFFS EVERY FOUR HOURS AS NEEDED FOR WHEEZING OR SHORTNESS OF BREATH. 18 g 2  . ALPRAZolam (XANAX) 0.25 MG tablet TAKE (1) TABLET TWICE A DAY AS NEEDED. 60 tablet 5  . FLUoxetine (PROZAC) 40 MG capsule TAKE (1) CAPSULE DAILY. 90 capsule 3  . gabapentin (NEURONTIN) 100 MG capsule Take 1 capsule (100 mg total) by mouth 3 (three) times daily. 90 capsule 5  . losartan (COZAAR) 25 MG tablet Take 1 tablet (25 mg total) by mouth daily. 30 tablet 6  . pantoprazole (PROTONIX) 40 MG tablet TAKE 1 TABLET ONCE DAILY. 90 tablet 3  . rosuvastatin (CRESTOR) 20 MG tablet TAKE 1 TABLET ONCE DAILY. 90 tablet 3  . SPIRIVA RESPIMAT 2.5 MCG/ACT AERS USE 1 PUFF DAILY AS DIRECTED 4 g 5  . SYMBICORT 160-4.5 MCG/ACT inhaler USE 2 PUFFS TWICE DAILY. 10.2 g 5   No current facility-administered medications for this visit.  Allergies  Allergen Reactions  . Lipitor [Atorvastatin]     myalgias  . Sulfa Antibiotics     Past Medical History:  Diagnosis Date  . Allergic rhinitis   . Allergic rhinitis, cause unspecified 09/21/2011  . Aortic stenosis 08/23/2016  . Arthritis   . Asthma   . Asthma 09/21/2011  . Colon polyps   . COPD (chronic obstructive pulmonary disease) (HCC)    mild   . Coronary artery calcification 08/23/2016  . Hyperlipidemia   . UTI (lower urinary tract infection)     Blood pressure (!) 144/82, pulse 96.  Hyperlipidemia Today her BP is slightly elevated in the office, but before making any changes in her medication, I would like to see more readings.  She is to check her pressure daily at  home and write them on a log.  After 3-4 weeks she will fax this into our office.  Depending on those readings, we can determine if she really needs further drug therapy.  Patient agreeable to this plan.  She is also going to the lab today for BMET.   I believe the elevated heart rate is more related to her rushing to the office (she apparently went to the lab by mistake thinking her appointment was there) and use of her albuterol inhaler in the past 30 minutes.  Will watch this and asked that she include HR readings in her log.     Tommy Medal PharmD CPP [facility]

## 2016-09-28 LAB — LIPID PANEL
Cholesterol: 188 mg/dL (ref <200)
HDL: 90 mg/dL (ref >50)
LDL Cholesterol: 69 mg/dL (ref <100)
Total CHOL/HDL Ratio: 2.1 Ratio (ref <5.0)
Triglycerides: 146 mg/dL (ref <150)
VLDL: 29 mg/dL (ref <30)

## 2016-09-28 LAB — COMPREHENSIVE METABOLIC PANEL
ALT: 19 U/L (ref 6–29)
AST: 26 U/L (ref 10–35)
Albumin: 4 g/dL (ref 3.6–5.1)
Alkaline Phosphatase: 56 U/L (ref 33–130)
BUN: 16 mg/dL (ref 7–25)
CO2: 25 mmol/L (ref 20–31)
Calcium: 9.1 mg/dL (ref 8.6–10.4)
Chloride: 103 mmol/L (ref 98–110)
Creat: 0.94 mg/dL — ABNORMAL HIGH (ref 0.60–0.93)
Glucose, Bld: 90 mg/dL (ref 65–99)
Potassium: 4.6 mmol/L (ref 3.5–5.3)
Sodium: 139 mmol/L (ref 135–146)
Total Bilirubin: 0.6 mg/dL (ref 0.2–1.2)
Total Protein: 6.6 g/dL (ref 6.1–8.1)

## 2016-10-08 DIAGNOSIS — H524 Presbyopia: Secondary | ICD-10-CM | POA: Diagnosis not present

## 2016-10-08 DIAGNOSIS — H40013 Open angle with borderline findings, low risk, bilateral: Secondary | ICD-10-CM | POA: Diagnosis not present

## 2016-11-07 ENCOUNTER — Other Ambulatory Visit: Payer: Self-pay | Admitting: Pulmonary Disease

## 2016-11-15 ENCOUNTER — Telehealth: Payer: Self-pay | Admitting: Pharmacist Clinician (PhC)/ Clinical Pharmacy Specialist

## 2016-11-15 NOTE — Telephone Encounter (Signed)
Spoke with [name] - patient home BP readings doing well, average of 31 readings in April was 124/72.  Although 6 of those readings showed a systolic < 323, [name] reports that patient is not having any symptoms of hypotension.    Advised that they continue with current medication regimen and call should they notice an increase in BP or other problems.

## 2017-01-03 ENCOUNTER — Other Ambulatory Visit: Payer: Self-pay | Admitting: Pulmonary Disease

## 2017-01-15 ENCOUNTER — Telehealth: Payer: Self-pay | Admitting: Internal Medicine

## 2017-01-15 DIAGNOSIS — Z0000 Encounter for general adult medical examination without abnormal findings: Secondary | ICD-10-CM

## 2017-01-15 NOTE — Telephone Encounter (Signed)
Pt would like orders put in for her appt on 8/9

## 2017-01-15 NOTE — Telephone Encounter (Signed)
Done. Please inform pt. Thanks!

## 2017-01-17 ENCOUNTER — Ambulatory Visit: Payer: Medicare HMO | Admitting: Internal Medicine

## 2017-01-22 ENCOUNTER — Ambulatory Visit (INDEPENDENT_AMBULATORY_CARE_PROVIDER_SITE_OTHER): Payer: Medicare HMO | Admitting: Pulmonary Disease

## 2017-01-22 ENCOUNTER — Encounter: Payer: Self-pay | Admitting: Pulmonary Disease

## 2017-01-22 VITALS — BP 116/60 | HR 99 | Ht 61.0 in | Wt 138.6 lb

## 2017-01-22 DIAGNOSIS — J449 Chronic obstructive pulmonary disease, unspecified: Secondary | ICD-10-CM

## 2017-01-22 NOTE — Patient Instructions (Signed)
Continue using the Symbicort and Spiriva  Follow-up in 6 months

## 2017-01-22 NOTE — Progress Notes (Signed)
Andrea Larson    [id number]    14-Nov-1945  Primary Care Physician:John, Hunt Oris, MD  Referring Physician: Biagio Borg, MD [place], [place] [address]  Chief complaint:   Follow up for COPD GOLD D (CAT score 17, > 2 exacerbations/year)  HPI: Andrea Larson is a 71 year old with past medical history of COPD, asthma, hyperlipidemia. She has complains of dyspnea on exertion and at rest and occasional cough with sputum production, wheezing. She has symptoms every day throughout the year and at least 2 exacerbations per year requiring prednisone and antibiotic taper. She is currently on Symbicort and Spiriva. She has been on these medications for several years and she feels that helped with her symptoms. She is also using her albuterol rescue inhaler at least 2 times per day. She denies any nighttime awakenings She was seen in the ED on 06/17/16 for alcohol intoxication, COPD exacerbation. She was given Keflex for UTI and a prednisone taper. She feels that her symptoms are much better while on the prednisone. She has 4 days of the taper left.  She has 60-pack-year smoking history, quit in October 2016. 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  She had a screening CT which did not show any lung nodules. She had coronary artery calcifications and a cardiology follow up with echo, stress test which did not show any worrisome findings.    Interim History: Stable on symbicort and spiriva. No exacerbations or ER visits since last visit.   Outpatient Encounter Prescriptions as of 01/22/2017  Medication Sig  . albuterol (PROVENTIL) (2.5 MG/3ML) 0.083% nebulizer solution USE 1 NEBULE EVERY 6 HOURS AS NEEDED FOR WHEEZING OR SHORTNESS OF BREATH.  Marland Kitchen ALPRAZolam (XANAX) 0.25 MG tablet TAKE (1) TABLET TWICE A DAY AS NEEDED.  . Aspirin-Acetaminophen-Caffeine (EXCEDRIN EXTRA STRENGTH PO) Take 1-2  tablets by mouth daily as needed (Prn for arthritis flare-ups.).  Marland Kitchen FLUoxetine (PROZAC) 40 MG capsule TAKE (1) CAPSULE DAILY.  Marland Kitchen gabapentin (NEURONTIN) 100 MG capsule Take 1 capsule (100 mg total) by mouth 3 (three) times daily.  Marland Kitchen losartan (COZAAR) 25 MG tablet Take 25 mg by mouth daily.  . pantoprazole (PROTONIX) 40 MG tablet TAKE 1 TABLET ONCE DAILY.  . rosuvastatin (CRESTOR) 20 MG tablet TAKE 1 TABLET ONCE DAILY.  Marland Kitchen SPIRIVA RESPIMAT 2.5 MCG/ACT AERS USE 1 PUFF DAILY AS DIRECTED  . SYMBICORT 160-4.5 MCG/ACT inhaler USE 2 PUFFS TWICE DAILY.  . VENTOLIN HFA 108 (90 Base) MCG/ACT inhaler USE 2 PUFFS EVERY FOUR HOURS AS NEEDED FOR WHEEZING OR SHORTNESS OF BREATH.  . [DISCONTINUED] losartan (COZAAR) 25 MG tablet Take 1 tablet (25 mg total) by mouth daily.   No facility-administered encounter medications on file as of 01/22/2017.     Allergies as of 01/22/2017 - Review Complete 01/22/2017  Allergen Reaction Noted  . Lipitor [atorvastatin]  10/28/2012  . Sulfa antibiotics  08/20/2011    Past Medical History:  Diagnosis Date  . Allergic rhinitis   . Allergic rhinitis, cause unspecified 09/21/2011  . Aortic stenosis 08/23/2016  . Arthritis   . Asthma   . Asthma 09/21/2011  . Colon polyps   . COPD (chronic obstructive pulmonary disease) (HCC)    mild   . Coronary artery calcification 08/23/2016  . Hyperlipidemia   . UTI (lower urinary tract infection)     Past  Surgical History:  Procedure Laterality Date  . DENTAL SURGERY    . pilonidial cyst    . TONSILLECTOMY      Family History  Problem Relation Age of Onset  . Alcohol abuse Other   . Arthritis Other   . Heart disease Other   . Stroke Other   . Hypertension Other   . Hypertension Father   . Stroke Father   . Atrial fibrillation Brother   . Colon cancer Neg Hx     Social History   Social History  . Marital status: Divorced    Spouse name: N/A  . Number of children: N/A  . Years of education: N/A   Occupational  History  . Not on file.   Social History Main Topics  . Smoking status: Former Smoker    Packs/day: 0.75    Years: 55.00    Types: Cigarettes    Quit date: 04/18/2015  . Smokeless tobacco: Never Used  . Alcohol use 0.0 oz/week     Comment: occ.   . Drug use: No  . Sexual activity: Not on file   Other Topics Concern  . Not on file   Social History Narrative  . No narrative on file   Review of systems: Review of Systems  Constitutional: Negative for fever and chills.  HENT: Negative.   Eyes: Negative for blurred vision.  Respiratory: as per HPI  Cardiovascular: Negative for chest pain and palpitations.  Gastrointestinal: Negative for vomiting, diarrhea, blood per rectum. Genitourinary: Negative for dysuria, urgency, frequency and hematuria.  Musculoskeletal: Negative for myalgias, back pain and joint pain.  Skin: Negative for itching and rash.  Neurological: Negative for dizziness, tremors, focal weakness, seizures and loss of consciousness.  Endo/Heme/Allergies: Negative for environmental allergies.  Psychiatric/Behavioral: Negative for depression, suicidal ideas and hallucinations.  All other systems reviewed and are negative.  Physical Exam: Blood pressure 140/70, pulse (!) 103, height 5\' 4"  (1.626 m), weight 139 lb 9.6 oz (63.3 kg), SpO2 95 %. 94% on exertion. Gen:      No acute distress HEENT:  EOMI, sclera anicteric Neck:     No masses; no thyromegaly Lungs:    Clear to auscultation bilaterally; normal respiratory effort CV:         Regular rate and rhythm; no murmurs Abd:      + bowel sounds; soft, non-tender; no palpable masses, no distension Ext:    No edema; adequate peripheral perfusion Skin:      Warm and dry; no rash Neuro: alert and oriented x 3 Psych: normal mood and affect  Data Reviewed: CXR 06/17/16- No acute cardiopulmonary abnormality. I have reviewed the images personally CT chest 08/13/16- Lung-RADS Category 1 I have reviewed all images  personally.  PFTs 09/06/16 FVC 1.37 (54%) FEV1 0.70 (36%) F/F 51 TLC 110%  RV/TLC 141% DLCO 58% Severe obstruction with positive bronchodilator response, airtrapping, moderate diffusion defect.  Echo 09/11/15 - Left ventricle: The cavity size was normal. There was mild   concentric hypertrophy. Systolic function was normal. The   estimated ejection fraction was in the range of 60% to 65%. Wall   motion was normal; there were no regional wall motion   abnormalities. Doppler parameters are consistent with   indeterminate left ventricular relaxation (grade 1 diastolic   dysfunction). Doppler parameters are consistent with high   ventricular filling pressure. - Aortic valve: There was mild stenosis. There was trivial   regurgitation. - Mitral valve: Calcified annulus. Transvalvular velocity was  within the normal range. There was no evidence for stenosis.   There was no regurgitation. - Right ventricle: The cavity size was normal. Wall thickness was   normal. Systolic function was normal. - Tricuspid valve: There was mild regurgitation.  Stress test 08/30/16- Low risk study  Assessment:  #1 COPD GOLD She is back to Symbicort and Spiriva as she feels that this is helping better than the Trelegy and continues on albuterol nebs PRN.  She did not desat on exertion today. Referred for pulmonary rehab but she prefers to exercise on her own.  #2 Ex smoker Screening CT of chest noted with no lung nodules. Follow up next year.   Plan/Recommendations: - Continue symbicort, spiriva. - Continue Proventil and albuterol nebs PRN - Continue screening CT of chest.  Marshell Garfinkel MD  [facility]ulmonary [facility] Critical Care Pager [phone] 01/22/2017, 2:33 PM  CC: Biagio Borg, MD

## 2017-01-30 ENCOUNTER — Ambulatory Visit: Payer: Medicare HMO

## 2017-02-04 ENCOUNTER — Other Ambulatory Visit (INDEPENDENT_AMBULATORY_CARE_PROVIDER_SITE_OTHER): Payer: Medicare HMO

## 2017-02-04 DIAGNOSIS — Z0000 Encounter for general adult medical examination without abnormal findings: Secondary | ICD-10-CM

## 2017-02-04 LAB — CBC WITH DIFFERENTIAL/PLATELET
Basophils Absolute: 0.1 10*3/uL (ref 0.0–0.1)
Basophils Relative: 1.2 % (ref 0.0–3.0)
Eosinophils Absolute: 0.2 10*3/uL (ref 0.0–0.7)
Eosinophils Relative: 2.3 % (ref 0.0–5.0)
HCT: 40 % (ref 36.0–46.0)
Hemoglobin: 13.2 g/dL (ref 12.0–15.0)
Lymphocytes Relative: 16.5 % (ref 12.0–46.0)
Lymphs Abs: 1.1 10*3/uL (ref 0.7–4.0)
MCHC: 33.1 g/dL (ref 30.0–36.0)
MCV: 98.8 fl (ref 78.0–100.0)
Monocytes Absolute: 0.5 10*3/uL (ref 0.1–1.0)
Monocytes Relative: 7.8 % (ref 3.0–12.0)
Neutro Abs: 4.8 10*3/uL (ref 1.4–7.7)
Neutrophils Relative %: 72.2 % (ref 43.0–77.0)
Platelets: 297 10*3/uL (ref 150.0–400.0)
RBC: 4.05 Mil/uL (ref 3.87–5.11)
RDW: 13.8 % (ref 11.5–15.5)
WBC: 6.7 10*3/uL (ref 4.0–10.5)

## 2017-02-04 LAB — HEPATIC FUNCTION PANEL
ALT: 14 U/L (ref 0–35)
AST: 19 U/L (ref 0–37)
Albumin: 3.9 g/dL (ref 3.5–5.2)
Alkaline Phosphatase: 65 U/L (ref 39–117)
Bilirubin, Direct: 0.1 mg/dL (ref 0.0–0.3)
Total Bilirubin: 0.6 mg/dL (ref 0.2–1.2)
Total Protein: 7.1 g/dL (ref 6.0–8.3)

## 2017-02-04 LAB — LIPID PANEL
Cholesterol: 188 mg/dL (ref 0–200)
HDL: 94.7 mg/dL (ref >39.00)
LDL Cholesterol: 77 mg/dL (ref 0–99)
NonHDL: 93.16
Total CHOL/HDL Ratio: 2
Triglycerides: 82 mg/dL (ref 0.0–149.0)
VLDL: 16.4 mg/dL (ref 0.0–40.0)

## 2017-02-04 LAB — URINALYSIS, ROUTINE W REFLEX MICROSCOPIC
Bilirubin Urine: NEGATIVE
Ketones, ur: NEGATIVE
Leukocytes, UA: NEGATIVE
Nitrite: NEGATIVE
Specific Gravity, Urine: 1.015 (ref 1.000–1.030)
Total Protein, Urine: 30 — AB
Urine Glucose: NEGATIVE
Urobilinogen, UA: 0.2 (ref 0.0–1.0)
pH: 6 (ref 5.0–8.0)

## 2017-02-04 LAB — BASIC METABOLIC PANEL
BUN: 17 mg/dL (ref 6–23)
CO2: 26 mEq/L (ref 19–32)
Calcium: 8.9 mg/dL (ref 8.4–10.5)
Chloride: 101 mEq/L (ref 96–112)
Creatinine, Ser: 1 mg/dL (ref 0.40–1.20)
GFR: 57.7 mL/min — ABNORMAL LOW (ref >60.00)
Glucose, Bld: 92 mg/dL (ref 70–99)
Potassium: 4.3 mEq/L (ref 3.5–5.1)
Sodium: 136 mEq/L (ref 135–145)

## 2017-02-04 LAB — TSH: TSH: 0.98 u[IU]/mL (ref 0.35–4.50)

## 2017-02-05 ENCOUNTER — Encounter: Payer: Self-pay | Admitting: Internal Medicine

## 2017-02-07 ENCOUNTER — Other Ambulatory Visit: Payer: Self-pay | Admitting: Pulmonary Disease

## 2017-02-14 ENCOUNTER — Ambulatory Visit (INDEPENDENT_AMBULATORY_CARE_PROVIDER_SITE_OTHER): Payer: Medicare HMO | Admitting: Internal Medicine

## 2017-02-14 ENCOUNTER — Encounter: Payer: Self-pay | Admitting: Internal Medicine

## 2017-02-14 VITALS — BP 106/70 | Ht 61.0 in | Wt 138.0 lb

## 2017-02-14 DIAGNOSIS — J449 Chronic obstructive pulmonary disease, unspecified: Secondary | ICD-10-CM

## 2017-02-14 DIAGNOSIS — Z0000 Encounter for general adult medical examination without abnormal findings: Secondary | ICD-10-CM | POA: Diagnosis not present

## 2017-02-14 DIAGNOSIS — R739 Hyperglycemia, unspecified: Secondary | ICD-10-CM

## 2017-02-14 DIAGNOSIS — J4520 Mild intermittent asthma, uncomplicated: Secondary | ICD-10-CM

## 2017-02-14 DIAGNOSIS — E785 Hyperlipidemia, unspecified: Secondary | ICD-10-CM

## 2017-02-14 NOTE — Patient Instructions (Addendum)
Please continue all other medications as before, and refills have been done if requested.  Please have the pharmacy call with any other refills you may need.  Please continue your efforts at being more active, low cholesterol diet, and weight control..  Please keep your appointments with your specialists as you may have planned  Please return in 6 months, or sooner if needed, with Lab testing done 3-5 days before  

## 2017-02-14 NOTE — Progress Notes (Signed)
Subjective:    Patient ID: Andrea Larson, female    DOB: Nov 04, 1945, 71 y.o.   MRN: [id number]  HPI  Here to f/u; overall doing ok,  Pt denies chest pain, increasing sob or doe, wheezing, orthopnea, PND, increased LE swelling, palpitations, dizziness or syncope.  Pt denies new neurological symptoms such as new headache, or facial or extremity weakness or numbness.  Pt denies polydipsia, polyuria, or low sugar episode.  Pt states overall good compliance with meds, mostly trying to follow appropriate diet, with wt overall stable.  Wt Readings from Last 3 Encounters:  02/14/17 138 lb (62.6 kg)  01/22/17 138 lb 9.6 oz (62.9 kg)  09/18/16 139 lb 9.6 oz (63.3 kg)   Past Medical History:  Diagnosis Date  . Allergic rhinitis   . Allergic rhinitis, cause unspecified 09/21/2011  . Aortic stenosis 08/23/2016  . Arthritis   . Asthma   . Asthma 09/21/2011  . Colon polyps   . COPD (chronic obstructive pulmonary disease) (HCC)    mild   . Coronary artery calcification 08/23/2016  . Hyperlipidemia   . UTI (lower urinary tract infection)    Past Surgical History:  Procedure Laterality Date  . DENTAL SURGERY    . pilonidial cyst    . TONSILLECTOMY      reports that she quit smoking about 22 months ago. Her smoking use included Cigarettes. She has a 41.25 pack-year smoking history. She has never used smokeless tobacco. She reports that she drinks alcohol. She reports that she does not use drugs. family history includes Alcohol abuse in her other; Arthritis in her other; Atrial fibrillation in her brother; Heart disease in her other; Hypertension in her father and other; Stroke in her father and other. Allergies  Allergen Reactions  . Lipitor [Atorvastatin]     myalgias  . Sulfa Antibiotics    Current Outpatient Prescriptions on File Prior to Visit  Medication Sig Dispense Refill  . albuterol (PROVENTIL) (2.5 MG/3ML) 0.083% nebulizer solution USE 1 NEBULE EVERY 6 HOURS AS NEEDED FOR WHEEZING  OR SHORTNESS OF BREATH. 75 mL 0  . ALPRAZolam (XANAX) 0.25 MG tablet TAKE (1) TABLET TWICE A DAY AS NEEDED. 60 tablet 5  . Aspirin-Acetaminophen-Caffeine (EXCEDRIN EXTRA STRENGTH PO) Take 1-2 tablets by mouth daily as needed (Prn for arthritis flare-ups.).    Marland Kitchen FLUoxetine (PROZAC) 40 MG capsule TAKE (1) CAPSULE DAILY. 90 capsule 3  . gabapentin (NEURONTIN) 100 MG capsule Take 1 capsule (100 mg total) by mouth 3 (three) times daily. 90 capsule 5  . losartan (COZAAR) 25 MG tablet Take 25 mg by mouth daily.    . pantoprazole (PROTONIX) 40 MG tablet TAKE 1 TABLET ONCE DAILY. 90 tablet 3  . rosuvastatin (CRESTOR) 20 MG tablet TAKE 1 TABLET ONCE DAILY. 90 tablet 3  . SPIRIVA RESPIMAT 2.5 MCG/ACT AERS USE 1 PUFF DAILY AS DIRECTED 4 g 5  . SYMBICORT 160-4.5 MCG/ACT inhaler USE 2 PUFFS TWICE DAILY. 10.2 g 5  . VENTOLIN HFA 108 (90 Base) MCG/ACT inhaler USE 2 PUFFS EVERY FOUR HOURS AS NEEDED FOR WHEEZING OR SHORTNESS OF BREATH. 18 g 0   No current facility-administered medications on file prior to visit.    Review of Systems  Constitutional: Negative for other unusual diaphoresis or sweats HENT: Negative for ear discharge or swelling Eyes: Negative for other worsening visual disturbances Respiratory: Negative for stridor or other swelling  Gastrointestinal: Negative for worsening distension or other blood Genitourinary: Negative for retention or other urinary change  Musculoskeletal: Negative for other MSK pain or swelling Skin: Negative for color change or other new lesions Neurological: Negative for worsening tremors and other numbness  Psychiatric/Behavioral: Negative for worsening agitation or other fatigue All other system neg per pt    Objective:   Physical Exam BP 106/70   Ht 5\' 1"  (1.549 m)   Wt 138 lb (62.6 kg)   BMI 26.07 kg/m  VS noted,  Constitutional: Pt appears in NAD HENT: Head: NCAT.  Right Ear: External ear normal.  Left Ear: External ear normal.  Eyes: . Pupils are  equal, round, and reactive to light. Conjunctivae and EOM are normal Nose: without d/c or deformity Neck: Neck supple. Gross normal ROM Cardiovascular: Normal rate and regular rhythm.   Pulmonary/Chest: Effort normal and breath sounds without rales or wheezing.  Abd:  Soft, NT, ND, + BS, no organomegaly Neurological: Pt is alert. At baseline orientation, motor grossly intact Skin: Skin is warm. No rashes, other new lesions, no LE edema Psychiatric: Pt behavior is normal without agitation  No other exam findings Lab Results  Component Value Date   WBC 6.7 02/04/2017   HGB 13.2 02/04/2017   HCT 40.0 02/04/2017   PLT 297.0 02/04/2017   GLUCOSE 92 02/04/2017   CHOL 188 02/04/2017   TRIG 82.0 02/04/2017   HDL 94.70 02/04/2017   LDLDIRECT 124.8 09/21/2011   LDLCALC 77 02/04/2017   ALT 14 02/04/2017   AST 19 02/04/2017   NA 136 02/04/2017   K 4.3 02/04/2017   CL 101 02/04/2017   CREATININE 1.00 02/04/2017   BUN 17 02/04/2017   CO2 26 02/04/2017   TSH 0.98 02/04/2017   HGBA1C 5.2 02/15/2015       Assessment & Plan:

## 2017-02-17 NOTE — Assessment & Plan Note (Signed)
Lab Results  Component Value Date   LDLCALC 77 02/04/2017  stable overall by history and exam, recent data reviewed with pt, and pt to continue medical treatment as before,  to f/u any worsening symptoms or concerns 

## 2017-02-17 NOTE — Assessment & Plan Note (Signed)
stable overall by history and exam, and pt to continue medical treatment as before,  to f/u any worsening symptoms or concerns 

## 2017-02-17 NOTE — Assessment & Plan Note (Signed)
stable overall by history and exam and pt to continue medical treatment as before,  to f/u any worsening symptoms or concerns  

## 2017-02-17 NOTE — Assessment & Plan Note (Signed)
Lab Results  Component Value Date   HGBA1C 5.2 02/15/2015  stable overall by history and exam, recent data reviewed with pt, and pt to continue medical treatment as before,  to f/u any worsening symptoms or concerns 

## 2017-03-13 ENCOUNTER — Other Ambulatory Visit: Payer: Self-pay | Admitting: Pulmonary Disease

## 2017-03-26 ENCOUNTER — Other Ambulatory Visit: Payer: Self-pay | Admitting: Internal Medicine

## 2017-03-26 NOTE — Telephone Encounter (Signed)
Done hardcopy to Shirron  

## 2017-03-26 NOTE — Telephone Encounter (Signed)
Faxed

## 2017-04-09 ENCOUNTER — Other Ambulatory Visit: Payer: Self-pay | Admitting: Pulmonary Disease

## 2017-04-24 ENCOUNTER — Other Ambulatory Visit: Payer: Self-pay | Admitting: Cardiovascular Disease

## 2017-04-24 NOTE — Telephone Encounter (Signed)
Please review for refill. Thanks!  

## 2017-05-01 ENCOUNTER — Other Ambulatory Visit: Payer: Self-pay | Admitting: Pulmonary Disease

## 2017-05-02 DIAGNOSIS — Z8679 Personal history of other diseases of the circulatory system: Secondary | ICD-10-CM | POA: Diagnosis not present

## 2017-05-02 DIAGNOSIS — H35033 Hypertensive retinopathy, bilateral: Secondary | ICD-10-CM | POA: Diagnosis not present

## 2017-05-02 DIAGNOSIS — Z961 Presence of intraocular lens: Secondary | ICD-10-CM | POA: Diagnosis not present

## 2017-05-02 DIAGNOSIS — H40013 Open angle with borderline findings, low risk, bilateral: Secondary | ICD-10-CM | POA: Diagnosis not present

## 2017-05-20 ENCOUNTER — Other Ambulatory Visit: Payer: Self-pay | Admitting: Internal Medicine

## 2017-05-27 ENCOUNTER — Other Ambulatory Visit: Payer: Self-pay | Admitting: Cardiovascular Disease

## 2017-05-27 NOTE — Telephone Encounter (Signed)
Please review for refill, thanks ! 

## 2017-06-05 ENCOUNTER — Other Ambulatory Visit: Payer: Self-pay | Admitting: Pulmonary Disease

## 2017-06-25 ENCOUNTER — Other Ambulatory Visit: Payer: Self-pay | Admitting: Internal Medicine

## 2017-07-22 ENCOUNTER — Ambulatory Visit: Payer: Medicare HMO | Admitting: Pulmonary Disease

## 2017-07-22 ENCOUNTER — Encounter: Payer: Self-pay | Admitting: Pulmonary Disease

## 2017-07-22 VITALS — BP 128/78 | HR 97 | Ht 61.0 in | Wt 134.0 lb

## 2017-07-22 DIAGNOSIS — J449 Chronic obstructive pulmonary disease, unspecified: Secondary | ICD-10-CM | POA: Diagnosis not present

## 2017-07-22 NOTE — Patient Instructions (Signed)
Continue using the Symbicort and Spiriva. You need to use the Symbicort 2 puffs 2 times daily for good effect Continue using Mucinex.  We will start you on flutter valve.  Use the flutter valve 2-3 times a day for clearance of secretion Follow-up in 6 months.

## 2017-07-22 NOTE — Progress Notes (Signed)
IVYROSE HASHMAN    [id number]    Aug 21, 1945  Primary Care Physician:John, Hunt Oris, MD  Referring Physician: Biagio Borg, MD [address] [place], [place] [address]  Chief complaint:   Follow up for COPD GOLD D (CAT score 17, > 2 exacerbations/year)  HPI: Mrs. Forge is a 72 year old with past medical history of COPD, asthma, hyperlipidemia. She has complains of dyspnea on exertion and at rest and occasional cough with sputum production, wheezing. She has symptoms every day throughout the year and at least 2 exacerbations per year requiring prednisone and antibiotic taper. She is currently on Symbicort and Spiriva. She has been on these medications for several years and she feels that helped with her symptoms. She is also using her albuterol rescue inhaler at least 2 times per day. She denies any nighttime awakenings She was seen in the ED on 06/17/16 for alcohol intoxication, COPD exacerbation. She was given Keflex for UTI and a prednisone taper. She feels that her symptoms are much better while on the prednisone. She has 4 days of the taper left.  She has 60-pack-year smoking history, quit in October 2016. 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  She had a screening CT which did not show any lung nodules. She had coronary artery calcifications and a cardiology follow up with echo, stress test which did not show any worrisome findings.    Interim History: Tried on trelegy but she did not like it. Stable on Symbicort and Spiriva.  No exacerbations or ER visits. She is using Mucinex for chest congestion with good effect. Did not desat on exertion  Outpatient Encounter Medications as of 07/22/2017  Medication Sig  . albuterol (PROVENTIL) (2.5 MG/3ML) 0.083% nebulizer solution USE 1 NEBULE EVERY 6 HOURS AS NEEDED FOR WHEEZING OR SHORTNESS OF BREATH.  Marland Kitchen ALPRAZolam (XANAX) 0.25 MG tablet TAKE  (1) TABLET TWICE A DAY AS NEEDED.  . Aspirin-Acetaminophen-Caffeine (EXCEDRIN EXTRA STRENGTH PO) Take 1-2 tablets by mouth daily as needed (Prn for arthritis flare-ups.).  Marland Kitchen FLUoxetine (PROZAC) 40 MG capsule TAKE (1) CAPSULE DAILY.  Marland Kitchen gabapentin (NEURONTIN) 100 MG capsule TAKE (1) CAPSULE THREE TIMES DAILY.  Marland Kitchen losartan (COZAAR) 25 MG tablet TAKE 1 TABLET BY MOUTH DAILY.  . pantoprazole (PROTONIX) 40 MG tablet TAKE 1 TABLET ONCE DAILY.  . rosuvastatin (CRESTOR) 20 MG tablet TAKE 1 TABLET ONCE DAILY.  Marland Kitchen SPIRIVA RESPIMAT 2.5 MCG/ACT AERS USE 1 PUFF DAILY AS DIRECTED  . SYMBICORT 160-4.5 MCG/ACT inhaler USE 2 PUFFS TWICE DAILY.  . VENTOLIN HFA 108 (90 Base) MCG/ACT inhaler 2 PUFFS INTO THE LUNGS EVERY 4 HOURS IF NEEDED FOR WHEEZING/SHORTNESSOF BREATH.   No facility-administered encounter medications on file as of 07/22/2017.     Allergies as of 07/22/2017 - Review Complete 07/22/2017  Allergen Reaction Noted  . Lipitor [atorvastatin]  10/28/2012  . Sulfa antibiotics  08/20/2011    Past Medical History:  Diagnosis Date  . Allergic rhinitis   . Allergic rhinitis, cause unspecified 09/21/2011  . Aortic stenosis 08/23/2016  . Arthritis   . Asthma   . Asthma 09/21/2011  . Colon polyps   . COPD (chronic obstructive pulmonary disease) (HCC)    mild   . Coronary artery calcification 08/23/2016  . Hyperlipidemia   . UTI (lower urinary tract infection)     Past Surgical History:  Procedure Laterality Date  . DENTAL SURGERY    . pilonidial cyst    . TONSILLECTOMY      Family History  Problem Relation Age of Onset  . Alcohol abuse Other   . Arthritis Other   . Heart disease Other   . Stroke Other   . Hypertension Other   . Hypertension Father   . Stroke Father   . Atrial fibrillation Brother   . Colon cancer Neg Hx     Social History   Socioeconomic History  . Marital status: Divorced    Spouse name: Not on file  . Number of children: Not on file  . Years of education: Not on  file  . Highest education level: Not on file  Social Needs  . Financial resource strain: Not on file  . Food insecurity - worry: Not on file  . Food insecurity - inability: Not on file  . Transportation needs - medical: Not on file  . Transportation needs - non-medical: Not on file  Occupational History  . Not on file  Tobacco Use  . Smoking status: Former Smoker    Packs/day: 0.75    Years: 55.00    Pack years: 41.25    Types: Cigarettes    Last attempt to quit: 04/18/2015    Years since quitting: 2.2  . Smokeless tobacco: Never Used  Substance and Sexual Activity  . Alcohol use: Yes    Alcohol/week: 0.0 oz    Comment: occ.   . Drug use: No  . Sexual activity: Not on file  Other Topics Concern  . Not on file  Social History Narrative  . Not on file   Review of systems: Review of Systems  Constitutional: Negative for fever and chills.  HENT: Negative.   Eyes: Negative for blurred vision.  Respiratory: as per HPI  Cardiovascular: Negative for chest pain and palpitations.  Gastrointestinal: Negative for vomiting, diarrhea, blood per rectum. Genitourinary: Negative for dysuria, urgency, frequency and hematuria.  Musculoskeletal: Negative for myalgias, back pain and joint pain.  Skin: Negative for itching and rash.  Neurological: Negative for dizziness, tremors, focal weakness, seizures and loss of consciousness.  Endo/Heme/Allergies: Negative for environmental allergies.  Psychiatric/Behavioral: Negative for depression, suicidal ideas and hallucinations.  All other systems reviewed and are negative.  Physical Exam: Blood pressure 128/78, pulse 97, height 5\' 1"  (1.549 m), weight 134 lb (60.8 kg), SpO2 94 %. Gen:      No acute distress HEENT:  EOMI, sclera anicteric Neck:     No masses; no thyromegaly Lungs:    Clear to auscultation bilaterally; normal respiratory effort CV:         Regular rate and rhythm; no murmurs Abd:      + bowel sounds; soft, non-tender; no  palpable masses, no distension Ext:    No edema; adequate peripheral perfusion Skin:      Warm and dry; no rash Neuro: alert and oriented x 3 Psych: normal mood and affect  Data Reviewed: CXR 06/17/16- No acute cardiopulmonary abnormality. I have reviewed the images personally CT chest 08/13/16- Lung-RADS Category 1 I have reviewed all images personally.  PFTs 09/06/16 FVC 1.37 (54%), FEV1 0.70 (36%), F/F 51, TLC 110% , RV/TLC 141%, DLCO 58% Severe obstruction with positive bronchodilator response, airtrapping, moderate diffusion defect.  Echo 09/11/15 - Left ventricle: The cavity size was normal. There was mild   concentric hypertrophy. Systolic function was normal. The   estimated ejection fraction was in the range of  60% to 65%. Wall   motion was normal; there were no regional wall motion   abnormalities. Doppler parameters are consistent with   indeterminate left ventricular relaxation (grade 1 diastolic   dysfunction). Doppler parameters are consistent with high   ventricular filling pressure. - Aortic valve: There was mild stenosis. There was trivial   regurgitation. - Mitral valve: Calcified annulus. Transvalvular velocity was   within the normal range. There was no evidence for stenosis.   There was no regurgitation. - Right ventricle: The cavity size was normal. Wall thickness was   normal. Systolic function was normal. - Tricuspid valve: There was mild regurgitation.  Stress test 08/30/16- Low risk study  Assessment:  #1 COPD GOLD D Symptoms are stable on Symbicort and Spiriva.   Referred for pulmonary rehab but she prefers to exercise on her own. Continue Mucinex.  Start flutter valve for clearance of secretion.  #2 Ex smoker Screening CT of chest noted with no lung nodules.  Continue annual follow-up.  Plan/Recommendations: - Continue symbicort, spiriva. - Continue Proventil and albuterol nebs PRN - Continue screening CT of chest. - Mucinex, flutter  wall.  Marshell Garfinkel MD [facility] Pager [phone] 07/22/2017, 4:02 PM  CC: Biagio Borg, MD

## 2017-07-23 ENCOUNTER — Other Ambulatory Visit: Payer: Self-pay | Admitting: Pulmonary Disease

## 2017-07-27 ENCOUNTER — Other Ambulatory Visit: Payer: Self-pay | Admitting: Pulmonary Disease

## 2017-08-19 ENCOUNTER — Encounter: Payer: Self-pay | Admitting: Internal Medicine

## 2017-08-19 ENCOUNTER — Other Ambulatory Visit: Payer: Self-pay

## 2017-08-19 ENCOUNTER — Ambulatory Visit (INDEPENDENT_AMBULATORY_CARE_PROVIDER_SITE_OTHER): Payer: Medicare HMO | Admitting: Internal Medicine

## 2017-08-19 VITALS — BP 122/78 | HR 123 | Temp 97.7°F | Ht 61.0 in | Wt 133.0 lb

## 2017-08-19 DIAGNOSIS — E785 Hyperlipidemia, unspecified: Secondary | ICD-10-CM | POA: Diagnosis not present

## 2017-08-19 DIAGNOSIS — R739 Hyperglycemia, unspecified: Secondary | ICD-10-CM

## 2017-08-19 DIAGNOSIS — J449 Chronic obstructive pulmonary disease, unspecified: Secondary | ICD-10-CM | POA: Diagnosis not present

## 2017-08-19 DIAGNOSIS — Z0000 Encounter for general adult medical examination without abnormal findings: Secondary | ICD-10-CM

## 2017-08-19 DIAGNOSIS — E2839 Other primary ovarian failure: Secondary | ICD-10-CM

## 2017-08-19 DIAGNOSIS — Z23 Encounter for immunization: Secondary | ICD-10-CM

## 2017-08-19 MED ORDER — FLUTTER DEVI: 0 refills | Status: AC

## 2017-08-19 NOTE — Patient Instructions (Addendum)
You had the Pneumovax pneumonia shot today  Please schedule the bone density test before leaving today at the scheduling desk (where you check out)  Please continue all other medications as before, and refills have been done if requested.  Please have the pharmacy call with any other refills you may need.  Please continue your efforts at being more active, low cholesterol diet, and weight control.  You are otherwise up to date with prevention measures today.  Please keep your appointments with your specialists as you may have planned  Please return in 6 months, or sooner if needed, with Lab testing done 3-5 days before

## 2017-08-19 NOTE — Progress Notes (Signed)
Subjective:    Patient ID: Andrea Larson, female    DOB: [birth year], 72 y.o.   MRN: [id number]  HPI  Here for wellness and f/u;  Overall doing ok;  Pt denies Chest pain, worsening SOB, DOE, wheezing, orthopnea, PND, worsening LE edema, palpitations, dizziness or syncope.  Pt denies neurological change such as new headache, facial or extremity weakness.  Pt denies polydipsia, polyuria, or low sugar symptoms. Pt states overall good compliance with treatment and medications, good tolerability, and has been trying to follow appropriate diet.  Pt denies worsening depressive symptoms, suicidal ideation or panic. No fever, night sweats, wt loss, loss of appetite, or other constitutional symptoms.  Pt states good ability with ADL's, has low fall risk, home safety reviewed and adequate, no other significant changes in hearing or vision, and not active with exercise.  Pt plans to have mammogram soon and will call herself.  Lost 5 lbs since last august with better diet.   Wt Readings from Last 3 Encounters:  08/19/17 133 lb (60.3 kg)  07/22/17 134 lb (60.8 kg)  02/14/17 138 lb (62.6 kg)  No other interval hx or complaints Past Medical History:  Diagnosis Date  . Allergic rhinitis   . Allergic rhinitis, cause unspecified 09/21/2011  . Aortic stenosis 08/23/2016  . Arthritis   . Asthma   . Asthma 09/21/2011  . Colon polyps   . COPD (chronic obstructive pulmonary disease) (HCC)    mild   . Coronary artery calcification 08/23/2016  . Hyperlipidemia   . UTI (lower urinary tract infection)    Past Surgical History:  Procedure Laterality Date  . DENTAL SURGERY    . pilonidial cyst    . TONSILLECTOMY      reports that she quit smoking about 2 years ago. Her smoking use included cigarettes. She has a 41.25 pack-year smoking history. she has never used smokeless tobacco. She reports that she drinks alcohol. She reports that she does not use drugs. family history includes Alcohol abuse in her other;  Arthritis in her other; Atrial fibrillation in her brother; Heart disease in her other; Hypertension in her father and other; Stroke in her father and other. Allergies  Allergen Reactions  . Lipitor [Atorvastatin]     myalgias  . Sulfa Antibiotics    Current Outpatient Medications on File Prior to Visit  Medication Sig Dispense Refill  . albuterol (PROVENTIL) (2.5 MG/3ML) 0.083% nebulizer solution USE 1 VIAL IN NEBULIZER EVERY 6 HOURS AS NEEDED FOR WHEEZING/SHORTNESS OF BREATH. 75 mL 2  . ALPRAZolam (XANAX) 0.25 MG tablet TAKE (1) TABLET TWICE A DAY AS NEEDED. 60 tablet 5  . Aspirin-Acetaminophen-Caffeine (EXCEDRIN EXTRA STRENGTH PO) Take 1-2 tablets by mouth daily as needed (Prn for arthritis flare-ups.).    Marland Kitchen FLUoxetine (PROZAC) 40 MG capsule TAKE (1) CAPSULE DAILY. 90 capsule 3  . gabapentin (NEURONTIN) 100 MG capsule TAKE (1) CAPSULE THREE TIMES DAILY. 90 capsule 2  . losartan (COZAAR) 25 MG tablet TAKE 1 TABLET BY MOUTH DAILY. 30 tablet 4  . pantoprazole (PROTONIX) 40 MG tablet TAKE 1 TABLET ONCE DAILY. 90 tablet 3  . rosuvastatin (CRESTOR) 20 MG tablet TAKE 1 TABLET ONCE DAILY. 90 tablet 3  . SPIRIVA RESPIMAT 2.5 MCG/ACT AERS USE 1 PUFF DAILY AS DIRECTED 4 g 3  . SYMBICORT 160-4.5 MCG/ACT inhaler USE 2 PUFFS TWICE DAILY. 10.2 g 5  . VENTOLIN HFA 108 (90 Base) MCG/ACT inhaler 2 PUFFS INTO THE LUNGS EVERY 4 HOURS IF NEEDED FOR WHEEZING/SHORTNESSOF  BREATH. 18 g 0   No current facility-administered medications on file prior to visit.    Review of Systems Constitutional: Negative for other unusual diaphoresis, sweats, appetite or weight changes HENT: Negative for other worsening hearing loss, ear pain, facial swelling, mouth sores or neck stiffness.   Eyes: Negative for other worsening pain, redness or other visual disturbance.  Respiratory: Negative for other stridor or swelling Cardiovascular: Negative for other palpitations or other chest pain  Gastrointestinal: Negative for  worsening diarrhea or loose stools, blood in stool, distention or other pain Genitourinary: Negative for hematuria, flank pain or other change in urine volume.  Musculoskeletal: Negative for myalgias or other joint swelling.  Skin: Negative for other color change, or other wound or worsening drainage.  Neurological: Negative for other syncope or numbness. Hematological: Negative for other adenopathy or swelling Psychiatric/Behavioral: Negative for hallucinations, other worsening agitation, SI, self-injury, or new decreased concentration All other system neg per pt    Objective:   Physical Exam BP 122/78   Pulse (!) 123   Temp 97.7 F (36.5 C) (Oral)   Ht 5\' 1"  (1.549 m)   Wt 133 lb (60.3 kg)   SpO2 95%   BMI 25.13 kg/m  VS noted,  Constitutional: Pt is oriented to person, place, and time. Appears well-developed and well-nourished, in no significant distress and comfortable Head: Normocephalic and atraumatic  Eyes: Conjunctivae and EOM are normal. Pupils are equal, round, and reactive to light Right Ear: External ear normal without discharge Left Ear: External ear normal without discharge Nose: Nose without discharge or deformity Mouth/Throat: Oropharynx is without other ulcerations and moist  Neck: Normal range of motion. Neck supple. No JVD present. No tracheal deviation present or significant neck LA or mass Cardiovascular: Normal rate, regular rhythm, normal heart sounds and intact distal pulses.   Pulmonary/Chest: WOB normal and breath sounds without rales or wheezing  Abdominal: Soft. Bowel sounds are normal. NT. No HSM  Musculoskeletal: Normal range of motion. Exhibits no edema Lymphadenopathy: Has no other cervical adenopathy.  Neurological: Pt is alert and oriented to person, place, and time. Pt has normal reflexes. No cranial nerve deficit. Motor grossly intact, Gait intact Skin: Skin is warm and dry. No rash noted or new ulcerations Psychiatric:  Has normal mood and  affect. Behavior is normal without agitation No other exam findings  Lab Results  Component Value Date   WBC 6.7 02/04/2017   HGB 13.2 02/04/2017   HCT 40.0 02/04/2017   PLT 297.0 02/04/2017   GLUCOSE 92 02/04/2017   CHOL 188 02/04/2017   TRIG 82.0 02/04/2017   HDL 94.70 02/04/2017   LDLDIRECT 124.8 09/21/2011   LDLCALC 77 02/04/2017   ALT 14 02/04/2017   AST 19 02/04/2017   NA 136 02/04/2017   K 4.3 02/04/2017   CL 101 02/04/2017   CREATININE 1.00 02/04/2017   BUN 17 02/04/2017   CO2 26 02/04/2017   TSH 0.98 02/04/2017   HGBA1C 5.2 02/15/2015   Declines further labs    Assessment & Plan:

## 2017-08-19 NOTE — Assessment & Plan Note (Signed)
stable overall by history and exam, recent data reviewed with pt, and pt to continue medical treatment as before,  to f/u any worsening symptoms or concerns  

## 2017-08-19 NOTE — Assessment & Plan Note (Signed)
Lab Results  Component Value Date   LDLCALC 77 02/04/2017  stable overall by history and exam, recent data reviewed with pt, and pt to continue medical treatment as before,  to f/u any worsening symptoms or concerns

## 2017-08-19 NOTE — Assessment & Plan Note (Signed)
Lab Results  Component Value Date   HGBA1C 5.2 02/15/2015  stable overall by history and exam, recent data reviewed with pt, and pt to continue medical treatment as before,  to f/u any worsening symptoms or concerns

## 2017-08-19 NOTE — Assessment & Plan Note (Signed)

## 2017-08-22 ENCOUNTER — Ambulatory Visit (INDEPENDENT_AMBULATORY_CARE_PROVIDER_SITE_OTHER)
Admission: RE | Admit: 2017-08-22 | Discharge: 2017-08-22 | Disposition: A | Discharge status: Home / Self Care | Payer: Medicare HMO | Source: Ambulatory Visit | Attending: Internal Medicine | Admitting: Internal Medicine

## 2017-08-22 DIAGNOSIS — E2839 Other primary ovarian failure: Secondary | ICD-10-CM | POA: Diagnosis not present

## 2017-08-23 ENCOUNTER — Encounter: Payer: Self-pay | Admitting: Internal Medicine

## 2017-08-23 ENCOUNTER — Telehealth: Payer: Self-pay

## 2017-08-23 ENCOUNTER — Other Ambulatory Visit: Payer: Self-pay | Admitting: Internal Medicine

## 2017-08-23 MED ORDER — ALENDRONATE SODIUM 70 MG PO TABS: 70.0000 mg | ORAL_TABLET | ORAL | 3 refills | Status: DC

## 2017-08-23 NOTE — Telephone Encounter (Signed)
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  This is significant enough to warrant treatment, so I will send Fosamax 70 mg weekly to your pharmacy.  You should also hear from the office as well.    Lashone Stauber to please inform pt, I will do rx

## 2017-08-23 NOTE — Telephone Encounter (Signed)
Called pt, LVM.   CRM created.  

## 2017-08-25 ENCOUNTER — Other Ambulatory Visit: Payer: Self-pay | Admitting: Pulmonary Disease

## 2017-09-04 ENCOUNTER — Other Ambulatory Visit: Payer: Self-pay | Admitting: Internal Medicine

## 2017-09-05 NOTE — Telephone Encounter (Signed)
Done erx 

## 2017-09-10 ENCOUNTER — Other Ambulatory Visit: Payer: Self-pay | Admitting: Internal Medicine

## 2017-10-16 ENCOUNTER — Other Ambulatory Visit: Payer: Self-pay | Admitting: Internal Medicine

## 2017-10-17 NOTE — Telephone Encounter (Signed)
08/26/2017 60# 

## 2017-10-17 NOTE — Telephone Encounter (Signed)
Done erx 

## 2017-10-21 ENCOUNTER — Other Ambulatory Visit: Payer: Self-pay | Admitting: Internal Medicine

## 2017-11-01 ENCOUNTER — Other Ambulatory Visit: Payer: Self-pay | Admitting: Cardiovascular Disease

## 2017-11-01 NOTE — Telephone Encounter (Signed)
Rx sent to pharmacy   

## 2017-11-28 ENCOUNTER — Other Ambulatory Visit: Payer: Self-pay | Admitting: Internal Medicine

## 2017-11-29 NOTE — Telephone Encounter (Signed)
LOV w/you: 08/19/17 Next OV: 02/20/18 Ok to Rf?

## 2017-11-29 NOTE — Telephone Encounter (Signed)
Done erx 

## 2017-12-25 ENCOUNTER — Other Ambulatory Visit: Payer: Self-pay | Admitting: Cardiovascular Disease

## 2017-12-25 ENCOUNTER — Other Ambulatory Visit: Payer: Self-pay | Admitting: Internal Medicine

## 2017-12-26 IMAGING — CR DG CHEST 2V
2 series · 2 of 2 positions shown · non-contrast
Comparison: Chest radiograph dated 03/02/2015

CLINICAL DATA: 70-year-old female with wheezing and shortness of
breath

EXAM:
CHEST  2 VIEW

[w chest lat]
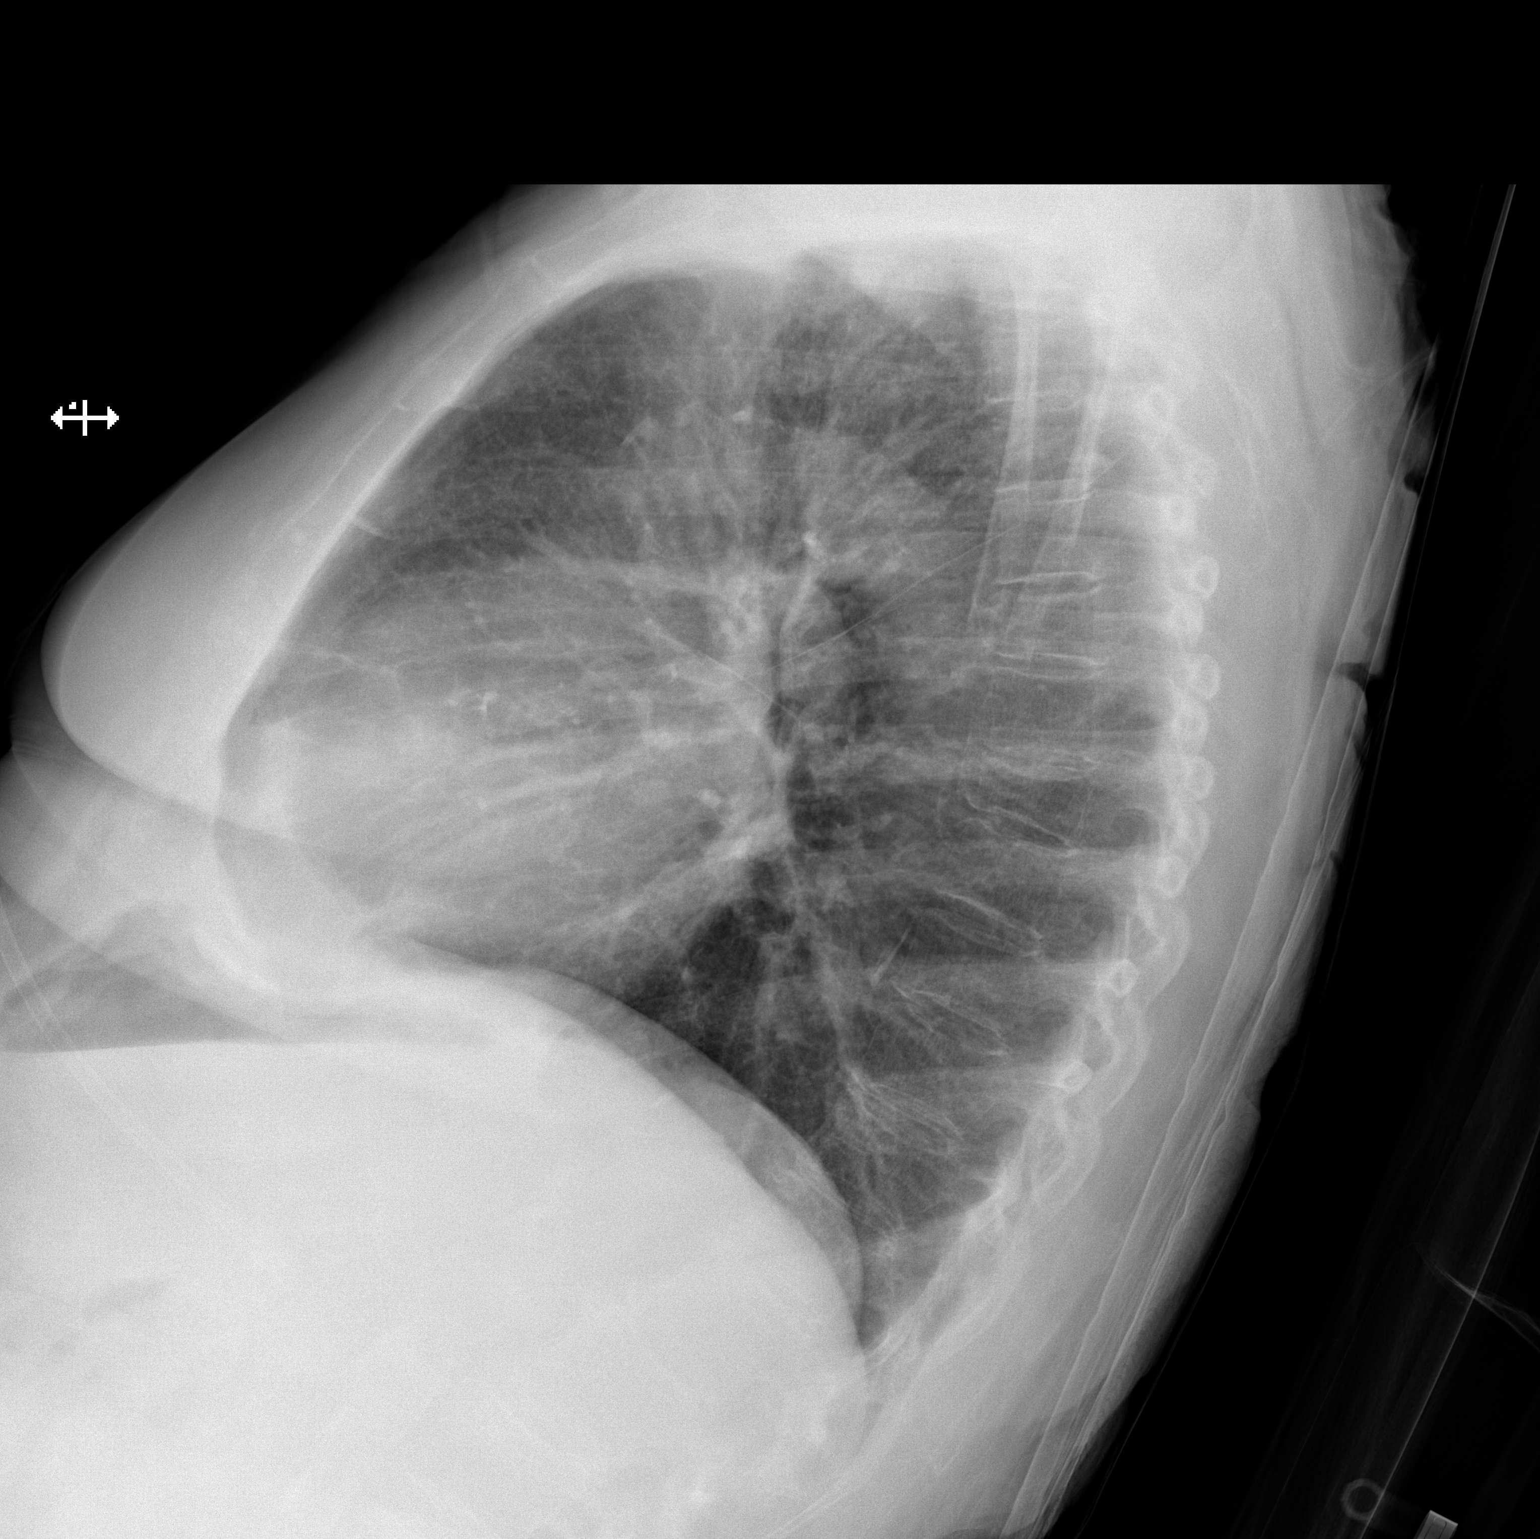

[x chest ap]
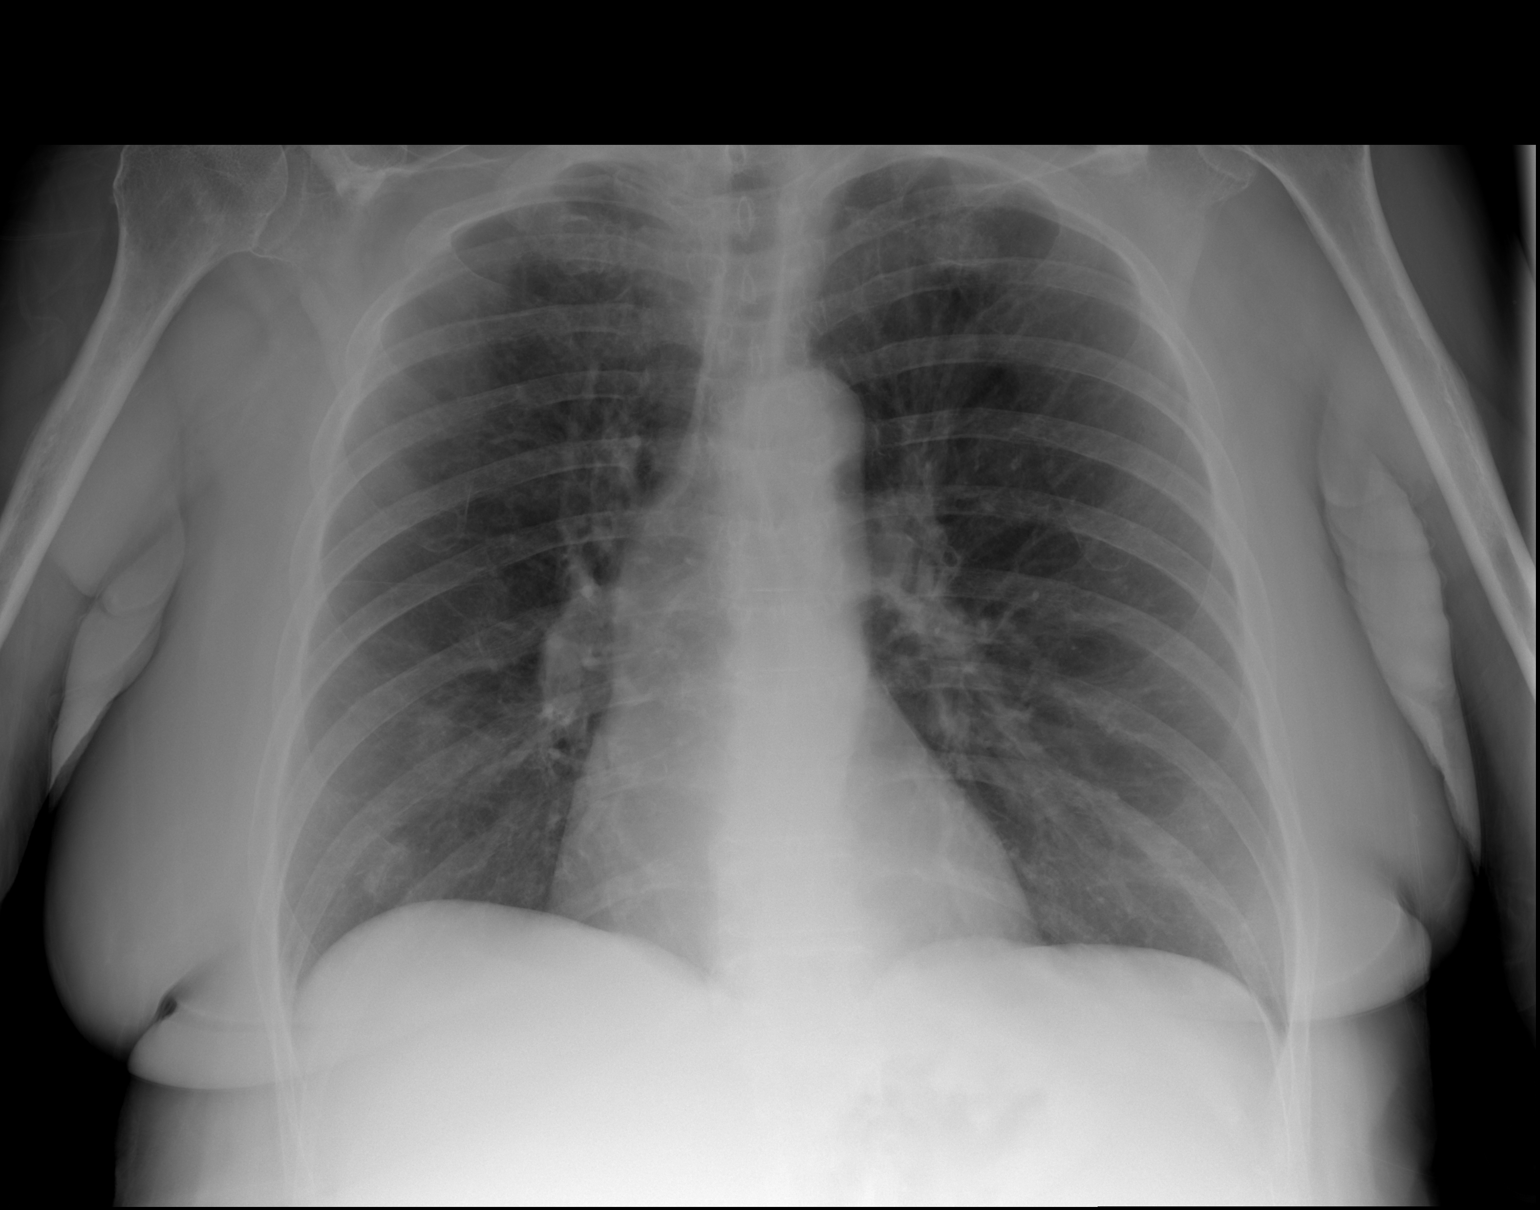

[2 of 2 positions shown; findings below may reference images not displayed]

FINDINGS: Two views of the chest demonstrate mild emphysematous changes of the
lungs. There is no focal consolidation, pleural effusion, or
pneumothorax. The cardiac silhouette is within normal limits. There
is osteopenia with degenerative changes of the spine. Stable
appearing mid thoracic compression fracture with anterior wedging.
No acute osseous pathology identified.
IMPRESSION: No active cardiopulmonary disease.

## 2018-02-11 ENCOUNTER — Telehealth: Payer: Self-pay | Admitting: Emergency Medicine

## 2018-02-11 NOTE — Telephone Encounter (Signed)
Called patient to schedule AWV. Pt will call back at later date to schedule. Can schedule at anytime.

## 2018-02-13 ENCOUNTER — Other Ambulatory Visit: Payer: Self-pay | Admitting: Internal Medicine

## 2018-02-13 DIAGNOSIS — Z1231 Encounter for screening mammogram for malignant neoplasm of breast: Secondary | ICD-10-CM | POA: Diagnosis not present

## 2018-02-13 LAB — HM MAMMOGRAPHY

## 2018-02-18 ENCOUNTER — Other Ambulatory Visit (INDEPENDENT_AMBULATORY_CARE_PROVIDER_SITE_OTHER): Payer: Medicare HMO

## 2018-02-18 DIAGNOSIS — E785 Hyperlipidemia, unspecified: Secondary | ICD-10-CM | POA: Diagnosis not present

## 2018-02-18 DIAGNOSIS — R739 Hyperglycemia, unspecified: Secondary | ICD-10-CM | POA: Diagnosis not present

## 2018-02-18 LAB — URINALYSIS, ROUTINE W REFLEX MICROSCOPIC
Bilirubin Urine: NEGATIVE
Ketones, ur: NEGATIVE
Nitrite: NEGATIVE
Specific Gravity, Urine: 1.015 (ref 1.000–1.030)
Total Protein, Urine: 100 — AB
Urine Glucose: NEGATIVE
Urobilinogen, UA: 0.2 (ref 0.0–1.0)
pH: 6 (ref 5.0–8.0)

## 2018-02-18 LAB — CBC WITH DIFFERENTIAL/PLATELET
Basophils Absolute: 0.1 K/uL (ref 0.0–0.1)
Basophils Relative: 1.2 % (ref 0.0–3.0)
Eosinophils Absolute: 0.1 K/uL (ref 0.0–0.7)
Eosinophils Relative: 1.7 % (ref 0.0–5.0)
HCT: 38.4 % (ref 36.0–46.0)
Hemoglobin: 12.7 g/dL (ref 12.0–15.0)
Lymphocytes Relative: 21 % (ref 12.0–46.0)
Lymphs Abs: 1.3 K/uL (ref 0.7–4.0)
MCHC: 33.2 g/dL (ref 30.0–36.0)
MCV: 99.2 fl (ref 78.0–100.0)
Monocytes Absolute: 0.6 K/uL (ref 0.1–1.0)
Monocytes Relative: 9.9 % (ref 3.0–12.0)
Neutro Abs: 4.2 K/uL (ref 1.4–7.7)
Neutrophils Relative %: 66.2 % (ref 43.0–77.0)
Platelets: 262 K/uL (ref 150.0–400.0)
RBC: 3.87 Mil/uL (ref 3.87–5.11)
RDW: 13.5 % (ref 11.5–15.5)
WBC: 6.4 K/uL (ref 4.0–10.5)

## 2018-02-18 LAB — HEPATIC FUNCTION PANEL
ALT: 15 U/L (ref 0–35)
AST: 24 U/L (ref 0–37)
Albumin: 4 g/dL (ref 3.5–5.2)
Alkaline Phosphatase: 71 U/L (ref 39–117)
Bilirubin, Direct: 0.1 mg/dL (ref 0.0–0.3)
Total Bilirubin: 0.6 mg/dL (ref 0.2–1.2)
Total Protein: 7.1 g/dL (ref 6.0–8.3)

## 2018-02-18 LAB — LIPID PANEL
Cholesterol: 205 mg/dL — ABNORMAL HIGH (ref 0–200)
HDL: 132.6 mg/dL
LDL Cholesterol: 56 mg/dL (ref 0–99)
NonHDL: 72.48
Total CHOL/HDL Ratio: 2
Triglycerides: 80 mg/dL (ref 0.0–149.0)
VLDL: 16 mg/dL (ref 0.0–40.0)

## 2018-02-18 LAB — BASIC METABOLIC PANEL WITH GFR
BUN: 17 mg/dL (ref 6–23)
CO2: 25 meq/L (ref 19–32)
Calcium: 9.2 mg/dL (ref 8.4–10.5)
Chloride: 100 meq/L (ref 96–112)
Creatinine, Ser: 2.16 mg/dL — ABNORMAL HIGH (ref 0.40–1.20)
GFR: 23.66 mL/min — ABNORMAL LOW
Glucose, Bld: 121 mg/dL — ABNORMAL HIGH (ref 70–99)
Potassium: 3.5 meq/L (ref 3.5–5.1)
Sodium: 133 meq/L — ABNORMAL LOW (ref 135–145)

## 2018-02-18 LAB — HEMOGLOBIN A1C: Hgb A1c MFr Bld: 5.3 % (ref 4.6–6.5)

## 2018-02-18 LAB — TSH: TSH: 2.07 u[IU]/mL (ref 0.35–4.50)

## 2018-02-19 ENCOUNTER — Ambulatory Visit (INDEPENDENT_AMBULATORY_CARE_PROVIDER_SITE_OTHER): Payer: Medicare HMO | Admitting: Internal Medicine

## 2018-02-19 ENCOUNTER — Encounter: Payer: Self-pay | Admitting: Internal Medicine

## 2018-02-19 ENCOUNTER — Other Ambulatory Visit: Payer: Medicare HMO

## 2018-02-19 ENCOUNTER — Other Ambulatory Visit: Payer: Self-pay | Admitting: Cardiovascular Disease

## 2018-02-19 ENCOUNTER — Telehealth: Payer: Self-pay

## 2018-02-19 VITALS — BP 124/82 | HR 104 | Temp 98.0°F | Ht 61.0 in | Wt 134.0 lb

## 2018-02-19 DIAGNOSIS — H40013 Open angle with borderline findings, low risk, bilateral: Secondary | ICD-10-CM | POA: Diagnosis not present

## 2018-02-19 DIAGNOSIS — R739 Hyperglycemia, unspecified: Secondary | ICD-10-CM | POA: Diagnosis not present

## 2018-02-19 DIAGNOSIS — R8290 Unspecified abnormal findings in urine: Secondary | ICD-10-CM

## 2018-02-19 DIAGNOSIS — N179 Acute kidney failure, unspecified: Secondary | ICD-10-CM | POA: Insufficient documentation

## 2018-02-19 DIAGNOSIS — Z0001 Encounter for general adult medical examination with abnormal findings: Secondary | ICD-10-CM | POA: Diagnosis not present

## 2018-02-19 DIAGNOSIS — N390 Urinary tract infection, site not specified: Secondary | ICD-10-CM | POA: Insufficient documentation

## 2018-02-19 MED ORDER — CIPROFLOXACIN HCL 500 MG PO TABS: 500.0000 mg | ORAL_TABLET | Freq: Two times a day (BID) | ORAL | 0 refills | Status: AC

## 2018-02-19 NOTE — Telephone Encounter (Signed)
-----   Message from Biagio Borg, MD sent at 02/18/2018  6:50 PM EDT ----- Andrea Larson to call pt - appears to have possible UTI and acute renal failure  Please ask her to be seen at same day appt in the AM of aug 14 if possible (instead of waiting for her appt aug 15)  Please let me know if pt is not able

## 2018-02-19 NOTE — Assessment & Plan Note (Signed)
Asympt, ? Infectious vs other, for urine cx, empiric cipro asd,  to f/u any worsening symptoms or concerns

## 2018-02-19 NOTE — Assessment & Plan Note (Signed)

## 2018-02-19 NOTE — Patient Instructions (Signed)
Please take all new medication as prescribed - the cipro antibiotic  Please continue all other medications as before, and refills have been done if requested.  Please have the pharmacy call with any other refills you may need.  Please continue your efforts at being more active, low cholesterol diet, and weight control.  You are otherwise up to date with prevention measures today.  Please keep your appointments with your specialists as you may have planned  Please go to the LAB in the Basement (turn left off the elevator) for the tests to be done today - just for the urine testing  Please go to the LAB in the Basement (turn left off the elevator) for the repeat kidney tests to be done on Friday  You will be contacted by phone if any changes need to be made immediately.  Otherwise, you will receive a letter about your results with an explanation, but please check with MyChart first.  Please remember to sign up for MyChart if you have not done so, as this will be important to you in the future with finding out test results, communicating by private email, and scheduling acute appointments online when needed.

## 2018-02-19 NOTE — Progress Notes (Signed)
Subjective:    Patient ID: Andrea Larson, female    DOB: [birth year], 72 y.o.   MRN: [id number]  HPI  Here for wellness and f/u;  Overall doing ok;  Pt denies Chest pain, worsening SOB, DOE, wheezing, orthopnea, PND, worsening LE edema, palpitations, dizziness or syncope.  Pt denies neurological change such as new headache, facial or extremity weakness.  Pt denies polydipsia, polyuria, or low sugar symptoms. Pt states overall good compliance with treatment and medications, good tolerability, and has been trying to follow appropriate diet.  Pt denies worsening depressive symptoms, suicidal ideation or panic. No fever, night sweats, wt loss, loss of appetite, or other constitutional symptoms.  Pt states good ability with ADL's, has low fall risk, home safety reviewed and adequate, no other significant changes in hearing or vision, and only occasionally active with exercise.  Denies urinary symptoms such as dysuria, frequency, urgency, flank pain, hematuria or n/v, fever, chills. States she thinks she may not drink enough fluids but not sure.  No recent vomiting or diarrhea, Denies worsening reflux, abd pain, dysphagia, n/v, bowel change or blood. Past Medical History:  Diagnosis Date  . Allergic rhinitis   . Allergic rhinitis, cause unspecified 09/21/2011  . Aortic stenosis 08/23/2016  . Arthritis   . Asthma   . Asthma 09/21/2011  . Colon polyps   . COPD (chronic obstructive pulmonary disease) (HCC)    mild   . Coronary artery calcification 08/23/2016  . Hyperlipidemia   . UTI (lower urinary tract infection)    Past Surgical History:  Procedure Laterality Date  . DENTAL SURGERY    . pilonidial cyst    . TONSILLECTOMY      reports that she quit smoking about 2 years ago. Her smoking use included cigarettes. She has a 41.25 pack-year smoking history. She has never used smokeless tobacco. She reports that she drinks alcohol. She reports that she does not use drugs. family history includes  Alcohol abuse in her other; Arthritis in her other; Atrial fibrillation in her brother; Heart disease in her other; Hypertension in her father and other; Stroke in her father and other. Allergies  Allergen Reactions  . Lipitor [Atorvastatin]     myalgias  . Sulfa Antibiotics    Current Outpatient Medications on File Prior to Visit  Medication Sig Dispense Refill  . albuterol (PROVENTIL) (2.5 MG/3ML) 0.083% nebulizer solution USE 1 VIAL IN NEBULIZER EVERY 6 HOURS AS NEEDED FOR WHEEZING/SHORTNESS OF BREATH. 75 mL 2  . alendronate (FOSAMAX) 70 MG tablet Take 1 tablet (70 mg total) by mouth every 7 (seven) days. Take with a full glass of water on an empty stomach. 12 tablet 3  . ALPRAZolam (XANAX) 0.25 MG tablet TAKE (1) TABLET TWICE A DAY AS NEEDED. 60 tablet 2  . Aspirin-Acetaminophen-Caffeine (EXCEDRIN EXTRA STRENGTH PO) Take 1-2 tablets by mouth daily as needed (Prn for arthritis flare-ups.).    Marland Kitchen FLUoxetine (PROZAC) 40 MG capsule TAKE (1) CAPSULE DAILY. 90 capsule 3  . gabapentin (NEURONTIN) 100 MG capsule TAKE (1) CAPSULE THREE TIMES DAILY. 90 capsule 0  . losartan (COZAAR) 25 MG tablet TAKE 1 TABLET BY MOUTH DAILY. 30 tablet 0  . pantoprazole (PROTONIX) 40 MG tablet TAKE 1 TABLET ONCE DAILY. 90 tablet 3  . Respiratory Therapy Supplies (FLUTTER) DEVI Use as directed 1 each 0  . rosuvastatin (CRESTOR) 20 MG tablet TAKE 1 TABLET ONCE DAILY. 90 tablet 3  . SPIRIVA RESPIMAT 2.5 MCG/ACT AERS USE 1 PUFF DAILY AS DIRECTED  4 g 5  . SYMBICORT 160-4.5 MCG/ACT inhaler USE 2 PUFFS TWICE DAILY. 10.2 g 2  . VENTOLIN HFA 108 (90 Base) MCG/ACT inhaler 2 PUFFS INTO THE LUNGS EVERY 4 HOURS IF NEEDED FOR WHEEZING/SHORTNESSOF BREATH. 1 Inhaler 5   No current facility-administered medications on file prior to visit.    Review of Systems Constitutional: Negative for other unusual diaphoresis, sweats, appetite or weight changes HENT: Negative for other worsening hearing loss, ear pain, facial swelling, mouth  sores or neck stiffness.   Eyes: Negative for other worsening pain, redness or other visual disturbance.  Respiratory: Negative for other stridor or swelling Cardiovascular: Negative for other palpitations or other chest pain  Gastrointestinal: Negative for worsening diarrhea or loose stools, blood in stool, distention or other pain Genitourinary: Negative for hematuria, flank pain or other change in urine volume.  Musculoskeletal: Negative for myalgias or other joint swelling.  Skin: Negative for other color change, or other wound or worsening drainage.  Neurological: Negative for other syncope or numbness. Hematological: Negative for other adenopathy or swelling Psychiatric/Behavioral: Negative for hallucinations, other worsening agitation, SI, self-injury, or new decreased concentration All other system neg per pt    Objective:   Physical Exam BP 124/82   Pulse (!) 104   Temp 98 F (36.7 C) (Oral)   Ht 5\' 1"  (1.549 m)   Wt 134 lb (60.8 kg)   SpO2 98%   BMI 25.32 kg/m  VS noted, not ill appearing Constitutional: Pt is oriented to person, place, and time. Appears well-developed and well-nourished, in no significant distress and comfortable Head: Normocephalic and atraumatic  Eyes: Conjunctivae and EOM are normal. Pupils are equal, round, and reactive to light Right Ear: External ear normal without discharge Left Ear: External ear normal without discharge Nose: Nose without discharge or deformity Mouth/Throat: Oropharynx is without other ulcerations and moist  Neck: Normal range of motion. Neck supple. No JVD present. No tracheal deviation present or significant neck LA or mass Cardiovascular: Normal rate, regular rhythm, normal heart sounds and intact distal pulses.   Pulmonary/Chest: WOB normal and breath sounds without rales or wheezing  Abdominal: Soft. Bowel sounds are normal. NT. No HSM  Musculoskeletal: Normal range of motion. Exhibits no edema Lymphadenopathy: Has no  other cervical adenopathy.  Neurological: Pt is alert and oriented to person, place, and time. Pt has normal reflexes. No cranial nerve deficit. Motor grossly intact, Gait intact Skin: Skin is warm and dry. No rash noted or new ulcerations Psychiatric:  Has normal mood and affect. Behavior is normal without agitation No other exam findings Lab Results  Component Value Date   WBC 6.4 02/18/2018   HGB 12.7 02/18/2018   HCT 38.4 02/18/2018   PLT 262.0 02/18/2018   GLUCOSE 121 (H) 02/18/2018   CHOL 205 (H) 02/18/2018   TRIG 80.0 02/18/2018   HDL 132.60 02/18/2018   LDLDIRECT 124.8 09/21/2011   LDLCALC 56 02/18/2018   ALT 15 02/18/2018   AST 24 02/18/2018   NA 133 (L) 02/18/2018   K 3.5 02/18/2018   CL 100 02/18/2018   CREATININE 2.16 (H) 02/18/2018   BUN 17 02/18/2018   CO2 25 02/18/2018   TSH 2.07 02/18/2018   HGBA1C 5.3 02/18/2018   Urinalysis, Routine w reflex microscopic  Order: [id number]  Status:  Final result Visible to patient:  No (Not Released) Next appt:  03/12/2018 at 03:40 PM in Cardiology Skeet Latch, MD) Dx:  Hyperlipidemia, unspecified hyperlipi...   Ref Range & Units 1d ago  29yr ago  Color, Urine Yellow;Lt. Yellow YELLOW  YELLOW   APPearance Clear CloudyAbnormal   CLEAR   Specific Gravity, Urine 1.000 - 1.030 1.015  1.015   pH 5.0 - 8.0 6.0  6.0   Total Protein, Urine Negative 100Abnormal   30Abnormal    Urine Glucose Negative NEGATIVE  NEGATIVE   Ketones, ur Negative NEGATIVE  NEGATIVE   Bilirubin Urine Negative NEGATIVE  NEGATIVE   Hgb urine dipstick Negative MODERATEAbnormal   SMALLAbnormal    Urobilinogen, UA 0.0 - 1.0 0.2  0.2   Leukocytes, UA Negative MODERATEAbnormal   NEGATIVE   Nitrite Negative NEGATIVE  NEGATIVE   WBC, UA 0-2/hpf 21-50/hpfAbnormal   3-6/hpfAbnormal    RBC / HPF 0-2/hpf 11-20/hpfAbnormal   0-2/hpf   Mucus, UA None Presence ofAbnormal     Squamous Epithelial / LPF Rare(0-4/hpf) Many(>10/hpf)Abnormal   Rare(0-4/hpf) CM    Bacteria, UA None Rare(<10/hpf)Abnormal   Rare(<10/hpf)Abnormal    Granular Casts, UA None Presence ofAbnormal     Hyaline Casts, UA None Presence ofAbnormal     Resulting Agency  [facility]            Assessment & Plan:

## 2018-02-19 NOTE — Telephone Encounter (Signed)
Pt informed and expressed understanding. She has made an appt for this afternoon.

## 2018-02-19 NOTE — Addendum Note (Signed)
Addended by: Biagio Borg on: 02/19/2018 07:08 PM   Modules accepted: Orders

## 2018-02-19 NOTE — Assessment & Plan Note (Addendum)
Etiology unclear, infectous vs volume vs other, for urine cx today, more fluids oral in next few days, repeat BMP in 2 days  In addition to the time spent performing CPE, I spent an additional 25 minutes face to face,in which greater than 50% of this time was spent in counseling and coordination of care for patient's acute illness as documented, including the differential dx, treatment, further evaluation and other management of AKI, abnormal UA, and hyperglycemia

## 2018-02-19 NOTE — Assessment & Plan Note (Signed)
Lab Results  Component Value Date   HGBA1C 5.3 02/18/2018   stable overall by history and exam, recent data reviewed with pt, and pt to continue medical treatment as before,  to f/u any worsening symptoms or concerns

## 2018-02-20 ENCOUNTER — Ambulatory Visit: Payer: Medicare HMO | Admitting: Internal Medicine

## 2018-02-21 ENCOUNTER — Other Ambulatory Visit: Payer: Self-pay | Admitting: Internal Medicine

## 2018-02-21 ENCOUNTER — Encounter: Payer: Self-pay | Admitting: Internal Medicine

## 2018-02-21 ENCOUNTER — Other Ambulatory Visit (INDEPENDENT_AMBULATORY_CARE_PROVIDER_SITE_OTHER): Payer: Medicare HMO

## 2018-02-21 DIAGNOSIS — N289 Disorder of kidney and ureter, unspecified: Secondary | ICD-10-CM

## 2018-02-21 DIAGNOSIS — N179 Acute kidney failure, unspecified: Secondary | ICD-10-CM | POA: Diagnosis not present

## 2018-02-21 LAB — BASIC METABOLIC PANEL
BUN: 23 mg/dL (ref 6–23)
CO2: 22 mEq/L (ref 19–32)
Calcium: 9.3 mg/dL (ref 8.4–10.5)
Chloride: 108 mEq/L (ref 96–112)
Creatinine, Ser: 2.39 mg/dL — ABNORMAL HIGH (ref 0.40–1.20)
GFR: 21.05 mL/min — ABNORMAL LOW (ref >60.00)
Glucose, Bld: 98 mg/dL (ref 70–99)
Potassium: 3.3 mEq/L — ABNORMAL LOW (ref 3.5–5.1)
Sodium: 139 mEq/L (ref 135–145)

## 2018-02-21 LAB — URINE CULTURE
MICRO NUMBER:: 90965601
SPECIMEN QUALITY:: ADEQUATE

## 2018-02-24 ENCOUNTER — Telehealth: Payer: Self-pay

## 2018-02-24 NOTE — Telephone Encounter (Signed)
-----   Message from Biagio Borg, MD sent at 02/21/2018  8:42 PM EDT ----- Letter sent, cont same tx except  The test results show that your current treatment is OK, except the kidney function is not improved, and the potassium is slightly low  Please stop the losartan, and we will need to check a kidney ultrasound and refer to Nephrology (kidney doctor).  You should hear soon.  Tinnie Kunin to please inform pt, I will stop the losartan, order kidney u/s and referral

## 2018-02-24 NOTE — Telephone Encounter (Signed)
Pt has been informed of results and expressed understanding.  °

## 2018-02-26 ENCOUNTER — Other Ambulatory Visit: Payer: Self-pay | Admitting: Pulmonary Disease

## 2018-03-12 ENCOUNTER — Encounter: Payer: Self-pay | Admitting: Cardiovascular Disease

## 2018-03-12 ENCOUNTER — Ambulatory Visit: Payer: Medicare HMO | Admitting: Cardiovascular Disease

## 2018-03-12 VITALS — BP 128/76 | HR 108 | Ht 61.0 in | Wt 133.2 lb

## 2018-03-12 DIAGNOSIS — I350 Nonrheumatic aortic (valve) stenosis: Secondary | ICD-10-CM

## 2018-03-12 DIAGNOSIS — I2584 Coronary atherosclerosis due to calcified coronary lesion: Secondary | ICD-10-CM | POA: Diagnosis not present

## 2018-03-12 DIAGNOSIS — I10 Essential (primary) hypertension: Secondary | ICD-10-CM | POA: Diagnosis not present

## 2018-03-12 DIAGNOSIS — I2510 Atherosclerotic heart disease of native coronary artery without angina pectoris: Secondary | ICD-10-CM

## 2018-03-12 DIAGNOSIS — E785 Hyperlipidemia, unspecified: Secondary | ICD-10-CM

## 2018-03-12 NOTE — Patient Instructions (Signed)
Medication Instructions:  Your physician recommends that you continue on your current medications as directed. Please refer to the Current Medication list given to you today.  Labwork: NONE  Testing/Procedures: Your physician has requested that you have an echocardiogram. Echocardiography is a painless test that uses sound waves to create images of your heart. It provides your doctor with information about the size and shape of your heart and how well your heart's chambers and valves are working. This procedure takes approximately one hour. There are no restrictions for this procedure. 1 YEAR   Follow-Up: Your physician wants you to follow-up in: [place] will receive a reminder letter in the mail two months in advance. If you don't receive a letter, please call our office to schedule the follow-up appointment.  If you need a refill on your cardiac medications before your next appointment, please call your pharmacy.

## 2018-03-12 NOTE — Progress Notes (Signed)
Cardiology Office Note   Date:  03/12/2018   ID:  Andrea Larson, DOB 03/07/1946, MRN [id number]  PCP:  Andrea Borg, MD  Cardiologist:   Andrea Latch, MD   No chief complaint on file.     History of Present Illness: Andrea Larson is a 72 y.o. female with mild aortic stenosis, hyperlipidemia, COPD and syncope  who presents for follow up.  She was initially seen 08/2016 for an evaluation of coronary calcification noted on CT. She had a screening CT scan on 08/14/16 and was noted to have atherosclerosis of the coronary arteries, aorta and aortic valve.  She initially reported shortness of breath and chest pressure.  She was referred for [facility] 08/30/16 that revealed LVEF 64% and no ischemia.  She also had an echocardiogram 09/10/16 that showed LVEF 60-65% with grade 1 diastolic dysfunction and mild aortic stenosis (mean gradient 12 mmHg).  Andrea Larson has been feeling well.  She was treated for urinary tract infection in August.  Those symptoms have resolved but she also had acute renal failure which has not resolved despite stopping losartan.  She is scheduled to see nephrology on Friday.  She has been feeling otherwise well.  She has no chest pain and her breathing has been stable.  She continues to get short of breath when rushing but does better with her inhalers.  She takes albuterol every morning and then as needed throughout the day it usually at least one time.  She has no lower extremity edema, orthopnea, or PND.  She does not get much exercise.  She tries to do a little extra walking around her office and around her home.  She has no palpitations, lightheadedness, or dizziness.  She drinks caffeine rarely   Past Medical History:  Diagnosis Date  . Allergic rhinitis   . Allergic rhinitis, cause unspecified 09/21/2011  . Aortic stenosis 08/23/2016  . Arthritis   . Asthma   . Asthma 09/21/2011  . Colon polyps   . COPD (chronic obstructive pulmonary disease) (HCC)    mild   . Coronary artery calcification 08/23/2016  . Hyperlipidemia   . UTI (lower urinary tract infection)     Past Surgical History:  Procedure Laterality Date  . DENTAL SURGERY    . pilonidial cyst    . TONSILLECTOMY       Current Outpatient Medications  Medication Sig Dispense Refill  . albuterol (PROVENTIL) (2.5 MG/3ML) 0.083% nebulizer solution USE 1 VIAL IN NEBULIZER EVERY 6 HOURS AS NEEDED FOR WHEEZING/SHORTNESS OF BREATH. 75 mL 2  . alendronate (FOSAMAX) 70 MG tablet Take 1 tablet (70 mg total) by mouth every 7 (seven) days. Take with a full glass of water on an empty stomach. 12 tablet 3  . ALPRAZolam (XANAX) 0.25 MG tablet TAKE (1) TABLET TWICE A DAY AS NEEDED. 60 tablet 2  . Aspirin-Acetaminophen-Caffeine (EXCEDRIN EXTRA STRENGTH PO) Take 1-2 tablets by mouth daily as needed (Prn for arthritis flare-ups.).    Marland Kitchen FLUoxetine (PROZAC) 40 MG capsule TAKE (1) CAPSULE DAILY. 90 capsule 3  . gabapentin (NEURONTIN) 100 MG capsule TAKE (1) CAPSULE THREE TIMES DAILY. 90 capsule 0  . pantoprazole (PROTONIX) 40 MG tablet TAKE 1 TABLET ONCE DAILY. 90 tablet 3  . Respiratory Therapy Supplies (FLUTTER) DEVI Use as directed 1 each 0  . rosuvastatin (CRESTOR) 20 MG tablet TAKE 1 TABLET ONCE DAILY. 90 tablet 3  . SPIRIVA RESPIMAT 2.5 MCG/ACT AERS USE 1 PUFF DAILY AS DIRECTED 4  g 5  . SYMBICORT 160-4.5 MCG/ACT inhaler USE 2 PUFFS TWICE DAILY. 10.2 g 2  . VENTOLIN HFA 108 (90 Base) MCG/ACT inhaler 2 PUFFS INTO THE LUNGS EVERY 4 HOURS IF NEEDED FOR WHEEZING/SHORTNESSOF BREATH. 18 g 0   No current facility-administered medications for this visit.     Allergies:   Lipitor [atorvastatin]    Social History:  The patient  reports that she quit smoking about 2 years ago. Her smoking use included cigarettes. She has a 41.25 pack-year smoking history. She has never used smokeless tobacco. She reports that she drinks alcohol. She reports that she does not use drugs.   Family History:  The patient's  family history includes Alcohol abuse in her other; Arthritis in her other; Atrial fibrillation in her brother; Heart disease in her other; Hypertension in her father and other; Stroke in her father and other.    ROS:  Please see the history of present illness.   Otherwise, review of systems are positive for none.   All other systems are reviewed and negative.    PHYSICAL EXAM: VS:  BP 128/76   Pulse (!) 108   Ht 5\' 1"  (1.549 m)   Wt 133 lb 3.2 oz (60.4 kg)   BMI 25.17 kg/m  , BMI Body mass index is 25.17 kg/m. GENERAL:  Well appearing HEENT: Pupils equal round and reactive, fundi not visualized, oral mucosa unremarkable NECK:  No jugular venous distention, waveform within normal limits, carotid upstroke brisk and symmetric, no bruits, no thyromegaly LYMPHATICS:  No cervical adenopathy LUNGS:  Clear to auscultation bilaterally HEART: Tachycardic.  Regular rhythm.  PMI not displaced or sustained,S1 and S2 within normal limits, no S3, no S4, no clicks, no rubs, II/VI early-peaking systolic murmur at the LUSB. ABD:  Flat, positive bowel sounds normal in frequency in pitch, no bruits, no rebound, no guarding, no midline pulsatile mass, no hepatomegaly, no splenomegaly EXT:  2 plus pulses throughout, no edema, no cyanosis no clubbing SKIN:  No rashes no nodules NEURO:  Cranial nerves II through XII grossly intact, motor grossly intact throughout PSYCH:  Cognitively intact, oriented to person place and time  EKG:  EKG is ordered today. The ekg ordered 07/20/16 demonstrates sinus rhythm.  Rate 93 bpm.  03/12/2018: Sinus tachycardia.  Rate 108 beats per minute. Nonspecific ST changes.   Echo 09/10/16: Study Conclusions  - Left ventricle: The cavity size was normal. There was mild   concentric hypertrophy. Systolic function was normal. The   estimated ejection fraction was in the range of 60% to 65%. Wall   motion was normal; there were no regional wall motion   abnormalities. Doppler  parameters are consistent with   indeterminate left ventricular relaxation (grade 1 diastolic   dysfunction). Doppler parameters are consistent with high   ventricular filling pressure. - Aortic valve: There was mild stenosis. There was trivial   regurgitation. - Mitral valve: Calcified annulus. Transvalvular velocity was   within the normal range. There was no evidence for stenosis.   There was no regurgitation. - Right ventricle: The cavity size was normal. Wall thickness was   normal. Systolic function was normal. - Tricuspid valve: There was mild regurgitation.   Lexiscan Myoview 08/30/16: The left ventricular ejection fraction is normal (55-65%).  Nuclear stress EF: 64%.  There was no ST segment deviation noted during stress.  The study is normal.  This is a low risk study   Recent Labs: 02/18/2018: ALT 15; Hemoglobin 12.7; Platelets 262.0; TSH  2.07 02/21/2018: BUN 23; Creatinine, Ser 2.39; Potassium 3.3; Sodium 139    Lipid Panel    Component Value Date/Time   CHOL 205 (H) 02/18/2018 1649   TRIG 80.0 02/18/2018 1649   TRIG 156 (H) 04/24/2006 1606   HDL 132.60 02/18/2018 1649   CHOLHDL 2 02/18/2018 1649   VLDL 16.0 02/18/2018 1649   LDLCALC 56 02/18/2018 1649   LDLDIRECT 124.8 09/21/2011 1051      Wt Readings from Last 3 Encounters:  03/12/18 133 lb 3.2 oz (60.4 kg)  02/19/18 134 lb (60.8 kg)  08/19/17 133 lb (60.3 kg)      ASSESSMENT AND PLAN:  # Coronary calcification: # Hyperlipidemia:  Ms. Billiter was noted to have coronary calcifications on CT scan. She had atypical chest pain that has resolved.  Lexiscan Myoview was negative for ischemia 08/2016.  Continue aspirin 81 mg daily and rosuvastatin. LDL 56 on 02/2018.  # Mild aortic stenosis: Ms. Langbehn is asymptomatic.  Mean gradient 12 mmHg.  Repeat echo 03/2019.   # Hypertension: BP well-controlled.  Losartan was discontinued in the setting of her acute renal failure.  If she needs blood pressure medication  in the future would consider a cardioselective beta-blocker given her CAD and tachycardia.  # Sinus tachycardia: Ms. Briel is asymptomatic.  Her heart rate decreased to 100 bpm after waiting for moment.  I suspect it was initially elevated because of her respiratory symptoms with walking.  She also uses albuterol frequently.  Given that she is asymptomatic no medication changes are needed.  Could consider Xopenex if she develops symptomatic tachycardia.  Thyroid function, blood counts, and electrolytes were within normal limits 02/18/2018.   Current medicines are reviewed at length with the patient today.  The patient does not have concerns regarding medicines.  The following changes have been made:  none Labs/ tests ordered today include  Orders Placed This Encounter  Procedures  . EKG 12-Lead  . ECHOCARDIOGRAM COMPLETE     Disposition:   FU with Kimesha Claxton C. Oval Linsey, MD, [facility] in 1 year.     Signed, Gregorio Worley C. Oval Linsey, MD, [facility]  03/12/2018 4:49 PM    [facility]

## 2018-03-14 ENCOUNTER — Ambulatory Visit
Admission: RE | Admit: 2018-03-14 | Discharge: 2018-03-14 | Disposition: A | Discharge status: Home / Self Care | Payer: Medicare HMO | Source: Ambulatory Visit | Attending: Internal Medicine | Admitting: Internal Medicine

## 2018-03-14 DIAGNOSIS — N189 Chronic kidney disease, unspecified: Secondary | ICD-10-CM | POA: Diagnosis not present

## 2018-03-14 DIAGNOSIS — N289 Disorder of kidney and ureter, unspecified: Secondary | ICD-10-CM

## 2018-03-15 ENCOUNTER — Encounter: Payer: Self-pay | Admitting: Internal Medicine

## 2018-03-18 ENCOUNTER — Ambulatory Visit: Payer: Medicare HMO | Admitting: Pulmonary Disease

## 2018-03-18 ENCOUNTER — Encounter: Payer: Self-pay | Admitting: Pulmonary Disease

## 2018-03-18 ENCOUNTER — Other Ambulatory Visit: Payer: Medicare HMO

## 2018-03-18 VITALS — BP 124/68 | HR 89 | Ht 60.0 in | Wt 135.0 lb

## 2018-03-18 DIAGNOSIS — Z23 Encounter for immunization: Secondary | ICD-10-CM

## 2018-03-18 DIAGNOSIS — J449 Chronic obstructive pulmonary disease, unspecified: Secondary | ICD-10-CM

## 2018-03-18 MED ORDER — LEVALBUTEROL TARTRATE 45 MCG/ACT IN AERO: 2.0000 | INHALATION_SPRAY | Freq: Four times a day (QID) | RESPIRATORY_TRACT | 12 refills | Status: DC | PRN

## 2018-03-18 MED ORDER — LEVALBUTEROL HCL 0.63 MG/3ML IN NEBU: 0.6300 mg | INHALATION_SOLUTION | Freq: Four times a day (QID) | RESPIRATORY_TRACT | 2 refills | Status: DC | PRN

## 2018-03-18 NOTE — Patient Instructions (Signed)
I am glad you are doing well with the breathing.  We will change your albuterol nebulizer and inhaler to Xopenex to help with the rapid heart rate Continue the Symbicort and Spiriva We will give flu vaccine today Check alpha-1 antitrypsin levels and phenotype Follow-up in 6 months with nurse practitioner.

## 2018-03-18 NOTE — Progress Notes (Signed)
Andrea Larson    [id number]    11/30/1945  Primary Care Physician:Andrea Larson, Andrea Oris, MD  Referring Physician: Biagio Borg, MD [place], [place] [id number]  Chief complaint:   Follow up for COPD GOLD B (CAT score 15, No recent exacerbations)  HPI: Andrea Larson is a 72 year old with past medical history of COPD, asthma, hyperlipidemia.  Previously on trelegy but did not like the inhaler.  Switch back to Symbicort and Spiriva.  She has 60-pack-year smoking history, quit in October 2016. 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  She has screening CTs which did not show any lung nodules. She had coronary artery calcifications and a cardiology follow up with echo, stress test which did not show any worrisome findings.    Interim History: Stable on Symbicort and Spiriva.  No exacerbations or ER visits. Seen by Dr. Joya Larson ardiology who was suggested Xopenex since she has sinus tachycardic  Physical Exam: Blood pressure 124/68, pulse 89, height 5' (1.524 m), weight 135 lb (61.2 kg), SpO2 95 %. Gen:      No acute distress HEENT:  EOMI, sclera anicteric Neck:     No masses; no thyromegaly Lungs:    Clear to auscultation bilaterally; normal respiratory effort CV:         Regular rate and rhythm; no murmurs Abd:      + bowel sounds; soft, non-tender; no palpable masses, no distension Ext:    No edema; adequate peripheral perfusion Skin:      Warm and dry; no rash Neuro: alert and oriented x 3 Psych: normal mood and affect  Data Reviewed: CXR 06/17/16- No acute cardiopulmonary abnormality. I have reviewed the images personally CT chest 08/13/16- Lung-RADS Category 1 I have reviewed all images personally.  PFTs 09/06/16 FVC 1.37 (54%), FEV1 0.70 (36%), F/F 51, TLC 110% , RV/TLC 141%, DLCO 58% Severe obstruction with positive bronchodilator response, airtrapping, moderate diffusion  defect.  Echo 09/11/15 - Left ventricle: The cavity size was normal. There was mild   concentric hypertrophy. Systolic function was normal. The   estimated ejection fraction was in the range of 60% to 65%. Wall   motion was normal; there were no regional wall motion   abnormalities. Doppler parameters are consistent with   indeterminate left ventricular relaxation (grade 1 diastolic   dysfunction). Doppler parameters are consistent with high   ventricular filling pressure. - Aortic valve: There was mild stenosis. There was trivial   regurgitation. - Mitral valve: Calcified annulus. Transvalvular velocity was   within the normal range. There was no evidence for stenosis.   There was no regurgitation. - Right ventricle: The cavity size was normal. Wall thickness was   normal. Systolic function was normal. - Tricuspid valve: There was mild regurgitation.  Stress test 08/30/16- Low risk study  Assessment:  COPD GOLD B Symptoms are stable on Symbicort and Spiriva.   Referred for pulmonary rehab but she prefers to exercise on her own. Continue Mucinex, flutter valve for clearance of secretion.  Okay with switching albuterol to Xopenex as she has issues with tachycardia.  Ex smoker Screening CT of chest noted with no lung nodules.  Continue annual follow-up.  Health maintenance Flu vaccine today 05/13/2013-Prevnar 08/19/2017-Pneumovax  Plan/Recommendations: - Continue symbicort, spiriva. - Change Proventil and albuterol nebs to xopenex - Continue screening CT  of chest. - Mucinex, flutter valve - Flu vaccine  Outpatient Encounter Medications as of 03/18/2018  Medication Sig  . albuterol (PROVENTIL) (2.5 MG/3ML) 0.083% nebulizer solution USE 1 VIAL IN NEBULIZER EVERY 6 HOURS AS NEEDED FOR WHEEZING/SHORTNESS OF BREATH.  Marland Kitchen alendronate (FOSAMAX) 70 MG tablet Take 1 tablet (70 mg total) by mouth every 7 (seven) days. Take with a full glass of water on an empty stomach.  . ALPRAZolam  (XANAX) 0.25 MG tablet TAKE (1) TABLET TWICE A DAY AS NEEDED.  . Aspirin-Acetaminophen-Caffeine (EXCEDRIN EXTRA STRENGTH PO) Take 1-2 tablets by mouth daily as needed (Prn for arthritis flare-ups.).  Marland Kitchen FLUoxetine (PROZAC) 40 MG capsule TAKE (1) CAPSULE DAILY.  Marland Kitchen gabapentin (NEURONTIN) 100 MG capsule TAKE (1) CAPSULE THREE TIMES DAILY.  . pantoprazole (PROTONIX) 40 MG tablet TAKE 1 TABLET ONCE DAILY.  Marland Kitchen Respiratory Therapy Supplies (FLUTTER) DEVI Use as directed  . rosuvastatin (CRESTOR) 20 MG tablet TAKE 1 TABLET ONCE DAILY.  Marland Kitchen SPIRIVA RESPIMAT 2.5 MCG/ACT AERS USE 1 PUFF DAILY AS DIRECTED  . SYMBICORT 160-4.5 MCG/ACT inhaler USE 2 PUFFS TWICE DAILY.  . VENTOLIN HFA 108 (90 Base) MCG/ACT inhaler 2 PUFFS INTO THE LUNGS EVERY 4 HOURS IF NEEDED FOR WHEEZING/SHORTNESSOF BREATH.   No facility-administered encounter medications on file as of 03/18/2018.     Allergies as of 03/18/2018 - Review Complete 03/18/2018  Allergen Reaction Noted  . Lipitor [atorvastatin]  10/28/2012    Past Medical History:  Diagnosis Date  . Allergic rhinitis   . Allergic rhinitis, cause unspecified 09/21/2011  . Aortic stenosis 08/23/2016  . Arthritis   . Asthma   . Asthma 09/21/2011  . Colon polyps   . COPD (chronic obstructive pulmonary disease) (HCC)    mild   . Coronary artery calcification 08/23/2016  . Hyperlipidemia   . UTI (lower urinary tract infection)     Past Surgical History:  Procedure Laterality Date  . DENTAL SURGERY    . pilonidial cyst    . TONSILLECTOMY      Family History  Problem Relation Age of Onset  . Alcohol abuse Other   . Arthritis Other   . Heart disease Other   . Stroke Other   . Hypertension Other   . Hypertension Father   . Stroke Father   . Atrial fibrillation Brother   . Colon cancer Neg Hx     Social History   Socioeconomic History  . Marital status: Divorced    Spouse name: Not on file  . Number of children: Not on file  . Years of education: Not on file    . Highest education level: Not on file  Occupational History  . Not on file  Social Needs  . Financial resource strain: Not on file  . Food insecurity:    Worry: Not on file    Inability: Not on file  . Transportation needs:    Medical: Not on file    Non-medical: Not on file  Tobacco Use  . Smoking status: Former Smoker    Packs/day: 0.75    Years: 55.00    Pack years: 41.25    Types: Cigarettes    Last attempt to quit: 04/18/2015    Years since quitting: 2.9  . Smokeless tobacco: Never Used  Substance and Sexual Activity  . Alcohol use: Yes    Alcohol/week: 0.0 standard drinks    Comment: occ.   . Drug use: No  . Sexual activity: Not on file  Lifestyle  . Physical  activity:    Days per week: Not on file    Minutes per session: Not on file  . Stress: Not on file  Relationships  . Social connections:    Talks on phone: Not on file    Gets together: Not on file    Attends religious service: Not on file    Active member of club or organization: Not on file    Attends meetings of clubs or organizations: Not on file    Relationship status: Not on file  . Intimate partner violence:    Fear of current or ex partner: Not on file    Emotionally abused: Not on file    Physically abused: Not on file    Forced sexual activity: Not on file  Other Topics Concern  . Not on file  Social History Narrative  . Not on file   Review of systems: Review of Systems  Constitutional: Negative for fever and chills.  HENT: Negative.   Eyes: Negative for blurred vision.  Respiratory: as per HPI  Cardiovascular: Negative for chest pain and palpitations.  Gastrointestinal: Negative for vomiting, diarrhea, blood per rectum. Genitourinary: Negative for dysuria, urgency, frequency and hematuria.  Musculoskeletal: Negative for myalgias, back pain and joint pain.  Skin: Negative for itching and rash.  Neurological: Negative for dizziness, tremors, focal weakness, seizures and loss of  consciousness.  Endo/Heme/Allergies: Negative for environmental allergies.  Psychiatric/Behavioral: Negative for depression, suicidal ideas and hallucinations.  All other systems reviewed and are negative.  Marshell Garfinkel MD [facility] 03/18/2018, 4:19 PM  CC: Andrea Borg, MD

## 2018-03-20 ENCOUNTER — Telehealth: Payer: Self-pay | Admitting: Internal Medicine

## 2018-03-20 NOTE — Telephone Encounter (Signed)
Last abnormal urine cx was mid august  Please consider ROV for persistent symptoms

## 2018-03-20 NOTE — Telephone Encounter (Signed)
Copied from [place] [id number]. Topic: Quick Communication - See Telephone Encounter >> Mar 20, 2018  9:43 AM Ahmed Prima L wrote: CRM for notification. See Telephone encounter for: 03/20/18.  Patient contacted me and asked if Dr Jenny Reichmann would mind letting her start back on "Ciprofloxacin 500 mg" taking twice a day. She was told to stop taking the medication because she was being referred to [facility]. Patient said she is still having symptoms and is uncomfortable. Patient has not heard from [facility] as of today 9/12. Please advise.

## 2018-03-20 NOTE — Telephone Encounter (Signed)
Pt has been informed and will schedule an OV

## 2018-03-21 ENCOUNTER — Encounter: Payer: Self-pay | Admitting: Internal Medicine

## 2018-03-21 ENCOUNTER — Other Ambulatory Visit (INDEPENDENT_AMBULATORY_CARE_PROVIDER_SITE_OTHER): Payer: Medicare HMO

## 2018-03-21 ENCOUNTER — Ambulatory Visit (INDEPENDENT_AMBULATORY_CARE_PROVIDER_SITE_OTHER): Payer: Medicare HMO | Admitting: Internal Medicine

## 2018-03-21 VITALS — BP 134/84 | HR 98 | Temp 97.6°F | Ht 60.0 in | Wt 136.0 lb

## 2018-03-21 DIAGNOSIS — E876 Hypokalemia: Secondary | ICD-10-CM | POA: Insufficient documentation

## 2018-03-21 DIAGNOSIS — R739 Hyperglycemia, unspecified: Secondary | ICD-10-CM | POA: Diagnosis not present

## 2018-03-21 DIAGNOSIS — N390 Urinary tract infection, site not specified: Secondary | ICD-10-CM | POA: Diagnosis not present

## 2018-03-21 DIAGNOSIS — N179 Acute kidney failure, unspecified: Secondary | ICD-10-CM

## 2018-03-21 LAB — URINALYSIS, ROUTINE W REFLEX MICROSCOPIC
Ketones, ur: 15 — AB
Leukocytes, UA: NEGATIVE
Nitrite: NEGATIVE
Specific Gravity, Urine: >=1.03 — AB (ref 1.000–1.030)
Total Protein, Urine: 100 — AB
Urine Glucose: NEGATIVE
Urobilinogen, UA: 0.2 (ref 0.0–1.0)
pH: 6 (ref 5.0–8.0)

## 2018-03-21 LAB — CBC WITH DIFFERENTIAL/PLATELET
Basophils Absolute: 0.1 10*3/uL (ref 0.0–0.1)
Basophils Relative: 0.7 % (ref 0.0–3.0)
Eosinophils Absolute: 0.1 10*3/uL (ref 0.0–0.7)
Eosinophils Relative: 0.7 % (ref 0.0–5.0)
HCT: 37.1 % (ref 36.0–46.0)
Hemoglobin: 12.3 g/dL (ref 12.0–15.0)
Lymphocytes Relative: 8.4 % — ABNORMAL LOW (ref 12.0–46.0)
Lymphs Abs: 0.9 10*3/uL (ref 0.7–4.0)
MCHC: 33.1 g/dL (ref 30.0–36.0)
MCV: 99.3 fl (ref 78.0–100.0)
Monocytes Absolute: 1 10*3/uL (ref 0.1–1.0)
Monocytes Relative: 8.9 % (ref 3.0–12.0)
Neutro Abs: 9.1 10*3/uL — ABNORMAL HIGH (ref 1.4–7.7)
Neutrophils Relative %: 81.3 % — ABNORMAL HIGH (ref 43.0–77.0)
Platelets: 320 10*3/uL (ref 150.0–400.0)
RBC: 3.74 Mil/uL — ABNORMAL LOW (ref 3.87–5.11)
RDW: 13.3 % (ref 11.5–15.5)
WBC: 11.2 10*3/uL — ABNORMAL HIGH (ref 4.0–10.5)

## 2018-03-21 LAB — BASIC METABOLIC PANEL
BUN: 19 mg/dL (ref 6–23)
CO2: 18 mEq/L — ABNORMAL LOW (ref 19–32)
Calcium: 8.7 mg/dL (ref 8.4–10.5)
Chloride: 99 mEq/L (ref 96–112)
Creatinine, Ser: 1.38 mg/dL — ABNORMAL HIGH (ref 0.40–1.20)
GFR: 39.66 mL/min — ABNORMAL LOW (ref >60.00)
Glucose, Bld: 54 mg/dL — ABNORMAL LOW (ref 70–99)
Potassium: 2.9 mEq/L — ABNORMAL LOW (ref 3.5–5.1)
Sodium: 136 mEq/L (ref 135–145)

## 2018-03-21 LAB — ALPHA-1 ANTITRYPSIN PHENOTYPE: A-1 Antitrypsin, Ser: 190 mg/dL (ref 83–199)

## 2018-03-21 MED ORDER — CEFTRIAXONE SODIUM 1 G IJ SOLR
1.0000 g | Freq: Once | INTRAMUSCULAR | Status: AC
  Administered 2018-03-21: 1 g via INTRAMUSCULAR

## 2018-03-21 NOTE — Progress Notes (Signed)
Subjective:    Patient ID: Andrea Larson, female    DOB: [birth year], 72 y.o.   MRN: [id number]  HPI   Here to f/u with friend who drove her; since last visit with tx for UTI last visit,  Then pt was contacted by our office after the finding of AKI; pt was called by my assistant and apparently pt misunderstood the instructions to stop the losartan, but instead stopped the antibiotic.  Only took 2 days, has 8 days left at home.Today pt c/o recurring lower abd discomfort a bit worse in area compared to last with dysuria and frequency, but no flank pain, hematuria or n/v, fever, chills.  Pt denies chest pain, increased sob or doe, wheezing, orthopnea, PND, increased LE swelling, palpitations, dizziness or syncope.  Pt denies new neurological symptoms such as new headache, or facial or extremity weakness or numbness   Recent renal u/s neg for hydronephrosis.  Has appt next wk with Dr Deedra Ehrich. No other new complaints Past Medical History:  Diagnosis Date  . Allergic rhinitis   . Allergic rhinitis, cause unspecified 09/21/2011  . Aortic stenosis 08/23/2016  . Arthritis   . Asthma   . Asthma 09/21/2011  . Colon polyps   . COPD (chronic obstructive pulmonary disease) (HCC)    mild   . Coronary artery calcification 08/23/2016  . Hyperlipidemia   . UTI (lower urinary tract infection)    Past Surgical History:  Procedure Laterality Date  . DENTAL SURGERY    . pilonidial cyst    . TONSILLECTOMY      reports that she quit smoking about 2 years ago. Her smoking use included cigarettes. She has a 41.25 pack-year smoking history. She has never used smokeless tobacco. She reports that she drinks alcohol. She reports that she does not use drugs. family history includes Alcohol abuse in her other; Arthritis in her other; Atrial fibrillation in her brother; Heart disease in her other; Hypertension in her father and other; Stroke in her father and other. Allergies  Allergen Reactions  . Lipitor  [Atorvastatin]     myalgias   Current Outpatient Medications on File Prior to Visit  Medication Sig Dispense Refill  . alendronate (FOSAMAX) 70 MG tablet Take 1 tablet (70 mg total) by mouth every 7 (seven) days. Take with a full glass of water on an empty stomach. 12 tablet 3  . ALPRAZolam (XANAX) 0.25 MG tablet TAKE (1) TABLET TWICE A DAY AS NEEDED. 60 tablet 2  . Aspirin-Acetaminophen-Caffeine (EXCEDRIN EXTRA STRENGTH PO) Take 1-2 tablets by mouth daily as needed (Prn for arthritis flare-ups.).    Marland Kitchen FLUoxetine (PROZAC) 40 MG capsule TAKE (1) CAPSULE DAILY. 90 capsule 3  . gabapentin (NEURONTIN) 100 MG capsule TAKE (1) CAPSULE THREE TIMES DAILY. 90 capsule 0  . levalbuterol (XOPENEX HFA) 45 MCG/ACT inhaler Inhale 2 puffs into the lungs every 6 (six) hours as needed for wheezing. 1 Inhaler 12  . levalbuterol (XOPENEX) 0.63 MG/3ML nebulizer solution Take 3 mLs (0.63 mg total) by nebulization every 6 (six) hours as needed for wheezing or shortness of breath. Dx:J44.9 3 mL 2  . pantoprazole (PROTONIX) 40 MG tablet TAKE 1 TABLET ONCE DAILY. 90 tablet 3  . Respiratory Therapy Supplies (FLUTTER) DEVI Use as directed 1 each 0  . rosuvastatin (CRESTOR) 20 MG tablet TAKE 1 TABLET ONCE DAILY. 90 tablet 3  . SPIRIVA RESPIMAT 2.5 MCG/ACT AERS USE 1 PUFF DAILY AS DIRECTED 4 g 5  . SYMBICORT 160-4.5 MCG/ACT inhaler USE  2 PUFFS TWICE DAILY. 10.2 g 2   No current facility-administered medications on file prior to visit.    Review of Systems  Constitutional: Negative for other unusual diaphoresis or sweats HENT: Negative for ear discharge or swelling Eyes: Negative for other worsening visual disturbances Respiratory: Negative for stridor or other swelling  Gastrointestinal: Negative for worsening distension or other blood Genitourinary: Negative for retention or other urinary change Musculoskeletal: Negative for other MSK pain or swelling Skin: Negative for color change or other new  lesions Neurological: Negative for worsening tremors and other numbness  Psychiatric/Behavioral: Negative for worsening agitation or other fatigue All other system neg per pt    Objective:   Physical Exam BP 134/84   Pulse 98   Temp 97.6 F (36.4 C) (Oral)   Ht 5' (1.524 m)   Wt 136 lb (61.7 kg)   SpO2 96%   BMI 26.56 kg/m  VS noted,  Constitutional: Pt appears in NAD HENT: Head: NCAT.  Right Ear: External ear normal.  Left Ear: External ear normal.  Eyes: . Pupils are equal, round, and reactive to light. Conjunctivae and EOM are normal Nose: without d/c or deformity Neck: Neck supple. Gross normal ROM Cardiovascular: Normal rate and regular rhythm.   Pulmonary/Chest: Effort normal and breath sounds without rales or wheezing.  Abd:  Soft, ND, + BS, no organomegaly, + low mid abd tender without flank tender, no guarding or rebound Neurological: Pt is alert. At baseline orientation, motor grossly intact Skin: Skin is warm. No rashes, other new lesions, no LE edema Psychiatric: Pt behavior is normal without agitation  No other exam findings  Lab Results  Component Value Date   WBC 6.4 02/18/2018   HGB 12.7 02/18/2018   HCT 38.4 02/18/2018   PLT 262.0 02/18/2018   GLUCOSE 98 02/21/2018   CHOL 205 (H) 02/18/2018   TRIG 80.0 02/18/2018   HDL 132.60 02/18/2018   LDLDIRECT 124.8 09/21/2011   LDLCALC 56 02/18/2018   ALT 15 02/18/2018   AST 24 02/18/2018   NA 139 02/21/2018   K 3.3 (L) 02/21/2018   CL 108 02/21/2018   CREATININE 2.39 (H) 02/21/2018   BUN 23 02/21/2018   CO2 22 02/21/2018   TSH 2.07 02/18/2018   HGBA1C 5.3 02/18/2018   Urine Culture  Specimen Information: Urine     Component 52mo ago  MICRO NUMBER: [id number]   SPECIMEN QUALITY: ADEQUATE   Sample Source URINE   STATUS: FINAL   ISOLATE 1: Escherichia coliAbnormal    Comment: 50,000-100,000 CFU/mL of Escherichia coli  Resulting Agency [facility]  Susceptibility    Escherichia coli    URINE CULTURE,  REFLEX    AMOX/CLAVULANIC 4  Sensitive    AMPICILLIN 8  Sensitive    AMPICILLIN/SULBACTAM 4  Sensitive    CEFAZOLIN <=4  Not Reportable1    CEFEPIME <=1  Sensitive    CEFTRIAXONE <=1  Sensitive    CIPROFLOXACIN <=0.25  Sensitive    ERTAPENEM <=0.5  Sensitive    GENTAMICIN <=1  Sensitive    IMIPENEM <=0.25  Sensitive    LEVOFLOXACIN <=0.12  Sensitive    NITROFURANTOIN <=16  Sensitive    PIP/TAZO <=4  Sensitive    TOBRAMYCIN <=1  Sensitive    TRIMETH/SULFA <=20  Sensitive2         1 For infections other than uncomplicated UTI caused by E. coli, K. pneumoniae or P. mirabilis: Cefazolin is resistant if MIC > or = 8 mcg/mL. (Distinguishing susceptible versus intermediate  for isolates with MIC < or = 4 mcg/mL requires additional testing.) For uncomplicated UTI caused by E. coli, K. pneumoniae or P. mirabilis: Cefazolin is susceptible if MIC <32 mcg/mL and predicts susceptible to the oral agents cefaclor, cefdinir, cefpodoxime, cefprozil, cefuroxime, cephalexin and loracarbef.  2 Legend: S = Susceptible I = Intermediate R = Resistant NS = Not susceptible * = Not tested NR = Not reported **NN = See antimicrobic comments        Specimen Collected: 02/19/18 17:05 Last Resulted: 02/21/18 12:24        RENAL / URINARY TRACT ULTRASOUND COMPLETE  COMPARISON:  None.  FINDINGS: Right Kidney:  Length: 9.9 cm. Cortical echogenicity within normal limits. Lobulated renal contour with probable areas of cortical scarring. No hydronephrosis  Left Kidney:  Length: 10.3 cm. Echogenicity within normal limits. No mass or hydronephrosis visualized.  Bladder:  Appears normal for degree of bladder distention. Incidental note made of gallstones. Normal wall thickness. Normal common duct diameter and negative sonographic Murphy.  IMPRESSION: 1. No hydronephrosis 2. Prominent lobulated contour of the kidney without definitive mass, suspect that there may be renal cortical  scarring on the right 3. Gallstone   Electronically Signed   By: Donavan Foil M.D.   On: 03/14/2018 22:06    Assessment & Plan:

## 2018-03-21 NOTE — Patient Instructions (Signed)
You had the antibiotic shot today  OK to finish the 8 days of cephalexin that you still have at home  Please stop the Losartan as we discussed  Please continue all other medications as before, and refills have been done if requested.  Please have the pharmacy call with any other refills you may need.  Please keep your appointments with your specialists as you may have planned  Please go to the LAB in the Basement (turn left off the elevator) for the tests to be done today  You will be contacted by phone if any changes need to be made immediately.  Otherwise, you will receive a letter about your results with an explanation, but please check with MyChart first.  Please remember to sign up for MyChart if you have not done so, as this will be important to you in the future with finding out test results, communicating by private email, and scheduling acute appointments online when needed.

## 2018-03-22 ENCOUNTER — Other Ambulatory Visit: Payer: Self-pay | Admitting: Internal Medicine

## 2018-03-22 ENCOUNTER — Encounter: Payer: Self-pay | Admitting: Internal Medicine

## 2018-03-22 LAB — URINE CULTURE
MICRO NUMBER:: 91100290
SPECIMEN QUALITY:: ADEQUATE

## 2018-03-22 MED ORDER — POTASSIUM CHLORIDE ER 10 MEQ PO TBCR: 20.0000 meq | EXTENDED_RELEASE_TABLET | Freq: Every day | ORAL | 0 refills | Status: DC

## 2018-03-22 NOTE — Assessment & Plan Note (Signed)
Mild last visit, and not aggressively tx with oral replacement due to recent AKI of unclear etiology, for f/u lab today

## 2018-03-22 NOTE — Assessment & Plan Note (Signed)
High suspicion for incompletely tx uti, for f/u urine cx today, rocephin IM 1 gm, and finish home cephalexin,  to f/u any worsening symptoms or concerns

## 2018-03-22 NOTE — Assessment & Plan Note (Signed)
Likely related to lower volume and UTI , renal u/s neg for hydro, for f/u lab today, f/u renal as planned

## 2018-03-22 NOTE — Assessment & Plan Note (Signed)
stable overall by history and exam, recent data reviewed with pt, and pt to continue medical treatment as before,  to f/u any worsening symptoms or concerns  

## 2018-03-24 ENCOUNTER — Telehealth: Payer: Self-pay

## 2018-03-24 ENCOUNTER — Other Ambulatory Visit: Payer: Self-pay | Admitting: Cardiovascular Disease

## 2018-03-24 NOTE — Telephone Encounter (Signed)
-----   Message from Biagio Borg, MD sent at 03/22/2018  1:46 PM EDT ----- Letter sent, cont same tx except  The test results show that your current treatment is OK, as the kidney function is improved.  The potassium is still mild low, so we need to take potassium for a short time - I will send the prescription, and you should hear from the office as well.  The urine culture is still pending, so please finish the antibiotic as we discussed.  Kacey Vicuna to please inform pt, I will do rx for potassium pills for 5 days

## 2018-03-24 NOTE — Telephone Encounter (Signed)
-----   Message from Biagio Borg, MD sent at 03/22/2018  1:46 PM EDT ----- Letter sent, cont same tx except  The test results show that your current treatment is OK, as the kidney function is improved.  The potassium is still mild low, so we need to take potassium for a short time - I will send the prescription, and you should hear from the office as well.  The urine culture is still pending, so please finish the antibiotic as we discussed.  Manan Olmo to please inform pt, I will do rx for potassium pills for 5 days

## 2018-03-24 NOTE — Telephone Encounter (Signed)
Pt has been informed of results and expressed understanding.  °

## 2018-03-25 ENCOUNTER — Other Ambulatory Visit: Payer: Self-pay | Admitting: Internal Medicine

## 2018-03-26 DIAGNOSIS — N390 Urinary tract infection, site not specified: Secondary | ICD-10-CM | POA: Diagnosis not present

## 2018-03-26 DIAGNOSIS — R627 Adult failure to thrive: Secondary | ICD-10-CM | POA: Diagnosis not present

## 2018-03-26 DIAGNOSIS — N179 Acute kidney failure, unspecified: Secondary | ICD-10-CM | POA: Diagnosis not present

## 2018-03-26 DIAGNOSIS — N184 Chronic kidney disease, stage 4 (severe): Secondary | ICD-10-CM | POA: Diagnosis not present

## 2018-03-26 DIAGNOSIS — E876 Hypokalemia: Secondary | ICD-10-CM | POA: Diagnosis not present

## 2018-03-27 DIAGNOSIS — N390 Urinary tract infection, site not specified: Secondary | ICD-10-CM | POA: Diagnosis not present

## 2018-03-27 DIAGNOSIS — N179 Acute kidney failure, unspecified: Secondary | ICD-10-CM | POA: Diagnosis not present

## 2018-04-11 ENCOUNTER — Inpatient Hospital Stay (HOSPITAL_COMMUNITY): Payer: Medicare HMO

## 2018-04-11 ENCOUNTER — Other Ambulatory Visit (INDEPENDENT_AMBULATORY_CARE_PROVIDER_SITE_OTHER): Payer: Medicare HMO

## 2018-04-11 ENCOUNTER — Inpatient Hospital Stay (HOSPITAL_COMMUNITY)
Admission: AD | Admit: 2018-04-11 | Discharge: 2018-04-14 | DRG: 683 | Disposition: A | Discharge status: Home / Self Care | Payer: Medicare HMO | Attending: Family Medicine | Admitting: Family Medicine

## 2018-04-11 ENCOUNTER — Encounter (HOSPITAL_COMMUNITY): Payer: Self-pay | Admitting: Internal Medicine

## 2018-04-11 ENCOUNTER — Encounter: Payer: Self-pay | Admitting: Physician Assistant

## 2018-04-11 ENCOUNTER — Telehealth: Payer: Self-pay | Admitting: Internal Medicine

## 2018-04-11 ENCOUNTER — Ambulatory Visit: Payer: Medicare HMO | Admitting: Physician Assistant

## 2018-04-11 ENCOUNTER — Other Ambulatory Visit: Payer: Self-pay

## 2018-04-11 VITALS — BP 124/78 | HR 128 | Ht 60.0 in | Wt 122.1 lb

## 2018-04-11 DIAGNOSIS — J449 Chronic obstructive pulmonary disease, unspecified: Secondary | ICD-10-CM | POA: Diagnosis present

## 2018-04-11 DIAGNOSIS — I350 Nonrheumatic aortic (valve) stenosis: Secondary | ICD-10-CM | POA: Diagnosis present

## 2018-04-11 DIAGNOSIS — R634 Abnormal weight loss: Secondary | ICD-10-CM

## 2018-04-11 DIAGNOSIS — M25531 Pain in right wrist: Secondary | ICD-10-CM | POA: Diagnosis not present

## 2018-04-11 DIAGNOSIS — R112 Nausea with vomiting, unspecified: Secondary | ICD-10-CM

## 2018-04-11 DIAGNOSIS — E876 Hypokalemia: Secondary | ICD-10-CM | POA: Diagnosis present

## 2018-04-11 DIAGNOSIS — Z87891 Personal history of nicotine dependence: Secondary | ICD-10-CM

## 2018-04-11 DIAGNOSIS — E440 Moderate protein-calorie malnutrition: Secondary | ICD-10-CM | POA: Diagnosis present

## 2018-04-11 DIAGNOSIS — Z79899 Other long term (current) drug therapy: Secondary | ICD-10-CM | POA: Diagnosis not present

## 2018-04-11 DIAGNOSIS — N179 Acute kidney failure, unspecified: Secondary | ICD-10-CM | POA: Diagnosis not present

## 2018-04-11 DIAGNOSIS — I10 Essential (primary) hypertension: Secondary | ICD-10-CM | POA: Diagnosis not present

## 2018-04-11 DIAGNOSIS — E785 Hyperlipidemia, unspecified: Secondary | ICD-10-CM | POA: Diagnosis present

## 2018-04-11 DIAGNOSIS — R52 Pain, unspecified: Secondary | ICD-10-CM

## 2018-04-11 DIAGNOSIS — Z6823 Body mass index (BMI) 23.0-23.9, adult: Secondary | ICD-10-CM | POA: Diagnosis not present

## 2018-04-11 DIAGNOSIS — R531 Weakness: Secondary | ICD-10-CM

## 2018-04-11 DIAGNOSIS — R627 Adult failure to thrive: Secondary | ICD-10-CM | POA: Diagnosis not present

## 2018-04-11 DIAGNOSIS — Z7951 Long term (current) use of inhaled steroids: Secondary | ICD-10-CM | POA: Diagnosis not present

## 2018-04-11 DIAGNOSIS — K8020 Calculus of gallbladder without cholecystitis without obstruction: Secondary | ICD-10-CM | POA: Diagnosis present

## 2018-04-11 DIAGNOSIS — M1811 Unilateral primary osteoarthritis of first carpometacarpal joint, right hand: Secondary | ICD-10-CM | POA: Diagnosis not present

## 2018-04-11 LAB — CBC WITH DIFFERENTIAL/PLATELET
Basophils Absolute: 0.1 10*3/uL (ref 0.0–0.1)
Basophils Relative: 0.9 % (ref 0.0–3.0)
Eosinophils Absolute: 0.1 10*3/uL (ref 0.0–0.7)
Eosinophils Relative: 0.5 % (ref 0.0–5.0)
HCT: 38.3 % (ref 36.0–46.0)
Hemoglobin: 12.4 g/dL (ref 12.0–15.0)
Lymphocytes Relative: 4.7 % — ABNORMAL LOW (ref 12.0–46.0)
Lymphs Abs: 0.5 10*3/uL — ABNORMAL LOW (ref 0.7–4.0)
MCHC: 32.4 g/dL (ref 30.0–36.0)
MCV: 97.9 fl (ref 78.0–100.0)
Monocytes Absolute: 1.1 10*3/uL — ABNORMAL HIGH (ref 0.1–1.0)
Monocytes Relative: 9.2 % (ref 3.0–12.0)
Neutro Abs: 9.9 10*3/uL — ABNORMAL HIGH (ref 1.4–7.7)
Neutrophils Relative %: 84.7 % — ABNORMAL HIGH (ref 43.0–77.0)
Platelets: 232 10*3/uL (ref 150.0–400.0)
RBC: 3.92 Mil/uL (ref 3.87–5.11)
RDW: 14.2 % (ref 11.5–15.5)
WBC: 11.7 10*3/uL — ABNORMAL HIGH (ref 4.0–10.5)

## 2018-04-11 LAB — COMPREHENSIVE METABOLIC PANEL
ALT: 6 U/L (ref 0–35)
AST: 12 U/L (ref 0–37)
Albumin: 3.5 g/dL (ref 3.5–5.2)
Alkaline Phosphatase: 51 U/L (ref 39–117)
BUN: 47 mg/dL — ABNORMAL HIGH (ref 6–23)
CO2: 12 mEq/L — ABNORMAL LOW (ref 19–32)
Calcium: 8.6 mg/dL (ref 8.4–10.5)
Chloride: 107 mEq/L (ref 96–112)
Creatinine, Ser: 3.31 mg/dL — ABNORMAL HIGH (ref 0.40–1.20)
GFR: 14.45 mL/min — CL (ref >60.00)
Glucose, Bld: 94 mg/dL (ref 70–99)
Potassium: 3.6 mEq/L (ref 3.5–5.1)
Sodium: 135 mEq/L (ref 135–145)
Total Bilirubin: 0.4 mg/dL (ref 0.2–1.2)
Total Protein: 7.1 g/dL (ref 6.0–8.3)

## 2018-04-11 LAB — CBC
HCT: 34.4 % — ABNORMAL LOW (ref 36.0–46.0)
Hemoglobin: 11.4 g/dL — ABNORMAL LOW (ref 12.0–15.0)
MCH: 31.8 pg (ref 26.0–34.0)
MCHC: 33.1 g/dL (ref 30.0–36.0)
MCV: 96.1 fL (ref 78.0–100.0)
Platelets: 222 10*3/uL (ref 150–400)
RBC: 3.58 MIL/uL — ABNORMAL LOW (ref 3.87–5.11)
RDW: 14 % (ref 11.5–15.5)
WBC: 9.4 10*3/uL (ref 4.0–10.5)

## 2018-04-11 LAB — SEDIMENTATION RATE
Sed Rate: 130 mm/hr — ABNORMAL HIGH (ref 0–30)
Sed Rate: 61 mm/hr — ABNORMAL HIGH (ref 0–22)

## 2018-04-11 LAB — CREATININE, SERUM
Creatinine, Ser: 3.51 mg/dL — ABNORMAL HIGH (ref 0.44–1.00)
GFR calc Af Amer: 14 mL/min — ABNORMAL LOW (ref >60)
GFR calc non Af Amer: 12 mL/min — ABNORMAL LOW (ref >60)

## 2018-04-11 LAB — LACTIC ACID, PLASMA: Lactic Acid, Venous: 0.7 mmol/L (ref 0.5–1.9)

## 2018-04-11 LAB — HIGH SENSITIVITY CRP: CRP, High Sensitivity: 158.15 mg/L — ABNORMAL HIGH (ref 0.000–5.000)

## 2018-04-11 LAB — URIC ACID: Uric Acid, Serum: 5.1 mg/dL (ref 2.5–7.1)

## 2018-04-11 MED ORDER — ALPRAZOLAM 0.25 MG PO TABS
0.2500 mg | ORAL_TABLET | Freq: Two times a day (BID) | ORAL | Status: DC | PRN
  Administered 2018-04-11: 0.25 mg via ORAL
  Filled 2018-04-11: qty 1

## 2018-04-11 MED ORDER — MOMETASONE FURO-FORMOTEROL FUM 200-5 MCG/ACT IN AERO
2.0000 | INHALATION_SPRAY | Freq: Two times a day (BID) | RESPIRATORY_TRACT | Status: DC
  Administered 2018-04-12 – 2018-04-14 (×5): 2 via RESPIRATORY_TRACT
  Filled 2018-04-11: qty 8.8

## 2018-04-11 MED ORDER — LEVALBUTEROL TARTRATE 45 MCG/ACT IN AERO: 2.0000 | INHALATION_SPRAY | Freq: Four times a day (QID) | RESPIRATORY_TRACT | Status: DC | PRN

## 2018-04-11 MED ORDER — ACETAMINOPHEN 325 MG PO TABS
650.0000 mg | ORAL_TABLET | Freq: Four times a day (QID) | ORAL | Status: DC | PRN
  Administered 2018-04-13: 650 mg via ORAL
  Filled 2018-04-11: qty 2

## 2018-04-11 MED ORDER — HYDRALAZINE HCL 20 MG/ML IJ SOLN: 10.0000 mg | INTRAMUSCULAR | Status: DC | PRN

## 2018-04-11 MED ORDER — ACETAMINOPHEN 650 MG RE SUPP: 650.0000 mg | Freq: Four times a day (QID) | RECTAL | Status: DC | PRN

## 2018-04-11 MED ORDER — ONDANSETRON HCL 4 MG PO TABS: 4.0000 mg | ORAL_TABLET | Freq: Four times a day (QID) | ORAL | Status: DC | PRN

## 2018-04-11 MED ORDER — SODIUM CHLORIDE 0.9 % IV BOLUS
1000.0000 mL | Freq: Once | INTRAVENOUS | Status: AC
  Administered 2018-04-11: 1000 mL via INTRAVENOUS

## 2018-04-11 MED ORDER — TIOTROPIUM BROMIDE MONOHYDRATE 18 MCG IN CAPS
18.0000 ug | ORAL_CAPSULE | Freq: Every day | RESPIRATORY_TRACT | Status: DC
  Administered 2018-04-12 – 2018-04-14 (×3): 18 ug via RESPIRATORY_TRACT
  Filled 2018-04-11: qty 5

## 2018-04-11 MED ORDER — SODIUM CHLORIDE 0.9 % IV SOLN
INTRAVENOUS | Status: DC
  Administered 2018-04-11: 22:00:00 via INTRAVENOUS

## 2018-04-11 MED ORDER — FLUOXETINE HCL 20 MG PO CAPS
40.0000 mg | ORAL_CAPSULE | Freq: Every day | ORAL | Status: DC
  Administered 2018-04-12 – 2018-04-14 (×3): 40 mg via ORAL
  Filled 2018-04-11 (×3): qty 2

## 2018-04-11 MED ORDER — ROSUVASTATIN CALCIUM 10 MG PO TABS
20.0000 mg | ORAL_TABLET | Freq: Every day | ORAL | Status: DC
  Administered 2018-04-12 – 2018-04-13 (×2): 20 mg via ORAL
  Filled 2018-04-11 (×2): qty 2

## 2018-04-11 MED ORDER — ONDANSETRON HCL 4 MG/2ML IJ SOLN: 4.0000 mg | Freq: Four times a day (QID) | INTRAMUSCULAR | Status: DC | PRN

## 2018-04-11 MED ORDER — LEVALBUTEROL HCL 0.63 MG/3ML IN NEBU: 0.6300 mg | INHALATION_SOLUTION | Freq: Four times a day (QID) | RESPIRATORY_TRACT | Status: DC | PRN

## 2018-04-11 MED ORDER — HEPARIN SODIUM (PORCINE) 5000 UNIT/ML IJ SOLN
5000.0000 [IU] | Freq: Three times a day (TID) | INTRAMUSCULAR | Status: DC
  Administered 2018-04-11 – 2018-04-14 (×7): 5000 [IU] via SUBCUTANEOUS
  Filled 2018-04-11 (×7): qty 1

## 2018-04-11 MED ORDER — ENSURE ENLIVE PO LIQD
237.0000 mL | Freq: Two times a day (BID) | ORAL | Status: DC
  Administered 2018-04-12 – 2018-04-13 (×3): 237 mL via ORAL

## 2018-04-11 NOTE — Telephone Encounter (Signed)
I admitted pt to [facility]/hospitaist service

## 2018-04-11 NOTE — Telephone Encounter (Signed)
Critical lab called to incorrect office. Will forward to ordering provider.

## 2018-04-11 NOTE — Progress Notes (Signed)
72 year old lady with prior h/o of  renal insufficiency, hypertension, hyperlipidemia, COPD, asthma, was referred by Dr Cathlean Cower to Gastroenterology for evaluation of loss of appetite, nausea and weight loss and generalized weakness.  On arrival to the [facility] office today, she was found to be afebrile, tachycardic in 120's, normotensive, .  Labs at the office revealed AKI, with creatinine of 3.3 , increased from a baseline of 1.3 , with elevated CRP. Her complaints include generalized weakness, .   Amy, NP with [facility] , requested Direct admission to [facility] service at [facility] for AKI.  She will be accepted to telemetry for AKI at [facility].    Hosie Poisson, MD  [phone]

## 2018-04-11 NOTE — Patient Instructions (Signed)
We will call with the lab results this afternoon.    Normal BMI (Body Mass Index- based on height and weight) is between 23 and 30. Your BMI today is Body mass index is 23.85 kg/m. Marland Kitchen Please consider follow up  regarding your BMI with your Primary Care Provider.

## 2018-04-11 NOTE — Progress Notes (Signed)
Subjective:    Patient ID: Andrea Larson, female    DOB: Dec 23, 1945, 72 y.o.   MRN: [id number]  HPI  Andrea Larson is a 72 year old white female, known to Dr. Fuller Plan who was last seen in our office in October 2016 when she underwent colonoscopy.  She was found to have 2 sessile polyps and diverticulosis.  Both polyps were tubular adenomas. She has history of COPD, not oxygen dependent, GERD, aortic stenosis, anxiety and depression and recurrent UTIs.    She is brought in today by her family.  She has been feeling ill over the past couple of months, she did have a urinary tract infection about a month ago which was treated.  Over the past 3 weeks she has had absolutely no appetite and has not been eating much at all.  They do not feel she is keeping herself hydrated and she has lost about 14 pounds in 3 weeks.  She has been able to keep down a little bit of very bland food.  She complains of vague nausea and has had a couple of episodes of vomiting.  She has no complaints of abdominal discomfort, no diarrhea or melena.  No fever chills or sweats.  She denies any dysphagia. She mentions that her right hand became swollen 2 days ago and has been tender. Her sister who she lives with says that she has been incredibly weak and is unable to walk on her own at this point.  She has spent most of the past couple of weeks in bed. Her brother mentions that she drinks vodka on a regular basis but patient says she has not had any in the past week.  [facility] Chest x-ray on 03/21/2018 was negative Most recent labs on 03/21/2018 globin 12.3 WBC of 10.2 and was 2.9 apparently corrected and creatinine was elevated at 1.38, TSH normal. She has been taking Advil on a regular basis generally 4 to 6/day.  Had seen her PCP about 2-1/2 weeks ago.  Sister feels that she needs to be in the hospital  Review of Systems Pertinent positive and negative review of systems were noted in the above HPI section.  All other review of  systems was otherwise negative.  Outpatient Encounter Medications as of 04/11/2018  Medication Sig  . ALPRAZolam (XANAX) 0.25 MG tablet TAKE (1) TABLET TWICE A DAY AS NEEDED.  Marland Kitchen FLUoxetine (PROZAC) 40 MG capsule TAKE (1) CAPSULE DAILY.  Marland Kitchen ibuprofen (ADVIL,MOTRIN) 100 MG/5ML suspension Take 200 mg by mouth as needed.  . levalbuterol (XOPENEX HFA) 45 MCG/ACT inhaler Inhale 2 puffs into the lungs every 6 (six) hours as needed for wheezing.  . levalbuterol (XOPENEX) 0.63 MG/3ML nebulizer solution Take 3 mLs (0.63 mg total) by nebulization every 6 (six) hours as needed for wheezing or shortness of breath. Dx:J44.9  . Respiratory Therapy Supplies (FLUTTER) DEVI Use as directed  . rosuvastatin (CRESTOR) 20 MG tablet TAKE 1 TABLET ONCE DAILY.  Marland Kitchen SPIRIVA RESPIMAT 2.5 MCG/ACT AERS USE 1 PUFF DAILY AS DIRECTED  . SYMBICORT 160-4.5 MCG/ACT inhaler USE 2 PUFFS TWICE DAILY.  . [DISCONTINUED] alendronate (FOSAMAX) 70 MG tablet Take 1 tablet (70 mg total) by mouth every 7 (seven) days. Take with a full glass of water on an empty stomach.  . [DISCONTINUED] Aspirin-Acetaminophen-Caffeine (EXCEDRIN EXTRA STRENGTH PO) Take 1-2 tablets by mouth daily as needed (Prn for arthritis flare-ups.).  . [DISCONTINUED] gabapentin (NEURONTIN) 100 MG capsule TAKE (1) CAPSULE THREE TIMES DAILY.  . [DISCONTINUED] losartan (COZAAR) 25 MG tablet  TAKE 1 TABLET BY MOUTH DAILY.  . [DISCONTINUED] pantoprazole (PROTONIX) 40 MG tablet TAKE 1 TABLET ONCE DAILY.  . [DISCONTINUED] potassium chloride (K-DUR) 10 MEQ tablet Take 2 tablets (20 mEq total) by mouth daily for 5 days.   No facility-administered encounter medications on file as of 04/11/2018.    Allergies  Allergen Reactions  . Lipitor [Atorvastatin]     myalgias   Patient Active Problem List   Diagnosis Date Noted  . Hypokalemia 03/21/2018  . UTI (urinary tract infection) 02/19/2018  . AKI (acute kidney injury) ([place]) 02/19/2018  . Coronary artery calcification 08/23/2016    . Aortic stenosis 08/23/2016  . Chest pain 07/20/2016  . Hyperglycemia 07/20/2016  . Insomnia 08/18/2015  . GERD (gastroesophageal reflux disease) 08/18/2015  . Acute bronchitis 02/15/2015  . COPD exacerbation ([place]) 02/15/2015  . Tachycardia 10/28/2012  . Back pain 10/28/2012  . Asthma 09/21/2011  . Allergic rhinitis 09/21/2011  . Colon polyps 09/21/2011  . Fatigue 09/21/2011  . Encounter for well adult exam with abnormal findings 09/15/2011  . SYNCOPE 06/20/2010  . DEGENERATIVE JOINT DISEASE, CERVICAL SPINE 10/21/2008  . Overweight(278.02) 02/10/2008  . ANXIETY DEPRESSION 02/10/2008  . COPD (chronic obstructive pulmonary disease) ([place]) 02/10/2008  . EMPHYSEMA NEC 01/30/2007  . Hyperlipidemia 12/31/2006   Social History   Socioeconomic History  . Marital status: Divorced    Spouse name: Not on file  . Number of children: Not on file  . Years of education: Not on file  . Highest education level: Not on file  Occupational History  . Not on file  Social Needs  . Financial resource strain: Not on file  . Food insecurity:    Worry: Not on file    Inability: Not on file  . Transportation needs:    Medical: Not on file    Non-medical: Not on file  Tobacco Use  . Smoking status: Former Smoker    Packs/day: 0.75    Years: 55.00    Pack years: 41.25    Types: Cigarettes    Last attempt to quit: 04/18/2015    Years since quitting: 2.9  . Smokeless tobacco: Never Used  Substance and Sexual Activity  . Alcohol use: Yes    Alcohol/week: 0.0 standard drinks    Comment: occ.   . Drug use: No  . Sexual activity: Not on file  Lifestyle  . Physical activity:    Days per week: Not on file    Minutes per session: Not on file  . Stress: Not on file  Relationships  . Social connections:    Talks on phone: Not on file    Gets together: Not on file    Attends religious service: Not on file    Active member of club or organization: Not on file    Attends meetings of clubs or  organizations: Not on file    Relationship status: Not on file  . Intimate partner violence:    Fear of current or ex partner: Not on file    Emotionally abused: Not on file    Physically abused: Not on file    Forced sexual activity: Not on file  Other Topics Concern  . Not on file  Social History Narrative  . Not on file    Andrea Larson's family history includes Alcohol abuse in her other; Arthritis in her other; Atrial fibrillation in her brother; Heart disease in her other; Hypertension in her father and other; Stroke in her father and other.  Objective:    Vitals:   04/11/18 1010  BP: 124/78  Pulse: (!) 128    Physical Exam; developed elderly white female, in no acute distress, she comes in  a wheelchair accompanied by her brother and sister.  She is weak and has difficulty getting up on the exam table. Blood pressure 124/78 pulse 128 height 5 foot weight 122 BMI of 23.8.  HEENT; nontraumatic normocephalic EOMI PERRLA sclera anicteric buccal mucosa somewhat dry, Cardiovascular; tachy  regular rhythm with S1-S2, Pulmonary; decreased breath sounds bilateral few scattered rhonchi in the bases, Abdomen soft, nondistended bowel sounds are present there is no palpable mass or hepatosplenomegaly, nontender.  Rectal ;exam not done, Extremities; no clubbing cyanosis or edema skin is warm and dry, she does have erythema, some mild swelling and tenderness of her right hand primarily around the base of the right thumb. Neuro psych alert and oriented, grossly nonfocal mood and affect appropriate       Assessment & Plan:   #51 71 year old white female with 1 month history of acute illness with failure to thrive, progressive weakness and fatigue, very poor appetite and poor oral intake with 14 pound weight loss over the past 3 weeks. She has no specific GI symptoms. Labs done 2 weeks ago did show mild acute kidney injury.  Etiology of current illness is unclear, he is clearly ill,  tachycardic and probably dehydrated.  Rule out underlying infection or malignancy  #2 history of recurrent UTIs and recent UTI #3 history of EtOH use, no history of cirrhosis #4 COPD #5 aortic stenosis #6 anxiety and depression #7.  History of adenomatous colon polyps-last colonoscopy 2016  Plan; stat labs will be obtained, and expect she will require admission.  This was discussed with the patient and family are in agreement.   Addendum;   today's labs showed decreased CO2 at 12 BUN 47/creatinine 3.31 GFR of 14 sed rate of 130 CRP of 158.1 WBC 11.7 hemoglobin 12.4.  I have discussed the patient with hospitalist Dr. Karleen Hampshire at [facility] who has accepted her on the hospitalist service.  GI will be available if needed.  Amy S Esterwood PA-C 04/11/2018   Cc: Biagio Borg, MD

## 2018-04-11 NOTE — Progress Notes (Signed)
Patient arrived on unit.  MD on call, paged.  Oncoming nurse made aware.

## 2018-04-11 NOTE — Telephone Encounter (Signed)
CRITICAL VALUE STICKER  CRITICAL VALUE: GFR 14.45  RECEIVER (on-site recipient of call):Julie  DATE & TIME NOTIFIED: 10/4 12:29  MESSENGER (representative from lab):Sy  MD NOTIFIED: Sharlet Salina  TIME OF NOTIFICATION:12:29  RESPONSE: will f/u

## 2018-04-11 NOTE — H&P (Addendum)
History and Physical    Andrea Larson GQQ:[id number] DOB: 1945/12/07 DOA: 04/11/2018  PCP: Biagio Borg, MD  Patient coming from: Home.  Chief Complaint: Worsening renal function.  HPI: Andrea Larson is a 72 y.o. female with history of hypertension, COPD, tobacco abuse, hyperlipidemia and alcoholism had gone to her primary care physician in August and September with concerns of UTI and at that time patient was found to have progressive renal failure and had an ultrasound of the kidneys which did not show any obstruction but possibility of gallstones and possible medical renal disease eventually was referred to nephrologist.  Patient usually takes Cozaar which was told to be could discontinue along with the NSAIDs.  Patient has follow-up labs scheduled for next week with nephrologist.  Over the last 2 weeks patient has become more weak and poor appetite but denies any chest pain or shortness of breath nausea vomiting or diarrhea.  This patient has benign poor appetite patient had followed up with her gastroenterologist and in the gastroenterologist clinic patient was found to be tachycardic and weak.  Labs revealed "further worsening of renal function.  Patient also was complaining of right wrist pain for the last 2 days and has started taking back to NSAIDs.  CRP done at GI office was 158 which is markedly elevated along with markedly elevated sed rate.  Patient was admitted directly.  On my exam patient is still tachycardic but denies any chest pain or shortness of breath.  Abdomen is benign on exam.  Patient admitted for acute renal failure and right wrist pain with SIRS picture.  ED Course: Patient was a direct admit.  Review of Systems: As per HPI, rest all negative.   Past Medical History:  Diagnosis Date  . Allergic rhinitis   . Allergic rhinitis, cause unspecified 09/21/2011  . Aortic stenosis 08/23/2016  . Arthritis   . Asthma   . Asthma 09/21/2011  . Colon polyps   . COPD  (chronic obstructive pulmonary disease) ([facility])    mild   . Coronary artery calcification 08/23/2016  . Hyperlipidemia   . UTI (lower urinary tract infection)     Past Surgical History:  Procedure Laterality Date  . DENTAL SURGERY    . pilonidial cyst    . TONSILLECTOMY       reports that she quit smoking about 2 years ago. Her smoking use included cigarettes. She has a 41.25 pack-year smoking history. She has never used smokeless tobacco. She reports that she drinks alcohol. She reports that she does not use drugs.  Allergies  Allergen Reactions  . Lipitor [Atorvastatin]     myalgias    Family History  Problem Relation Age of Onset  . Alcohol abuse Other   . Arthritis Other   . Heart disease Other   . Stroke Other   . Hypertension Other   . Hypertension Father   . Stroke Father   . Atrial fibrillation Brother   . Colon cancer Neg Hx     Prior to Admission medications   Medication Sig Start Date End Date Taking? Authorizing Provider  ALPRAZolam (XANAX) 0.25 MG tablet TAKE (1) TABLET TWICE A DAY AS NEEDED. Patient taking differently: Take 0.25 mg by mouth 2 (two) times daily as needed for anxiety.  11/29/17  Yes Biagio Borg, MD  FLUoxetine (PROZAC) 40 MG capsule TAKE (1) CAPSULE DAILY. Patient taking differently: Take 40 mg by mouth daily.  09/05/17  Yes Biagio Borg, MD  Ibuprofen (  ADVIL) 200 MG CAPS Take 1 capsule by mouth every 6 (six) hours as needed (pain).   Yes [provider]  levalbuterol (XOPENEX HFA) 45 MCG/ACT inhaler Inhale 2 puffs into the lungs every 6 (six) hours as needed for wheezing. 03/18/18  Yes Mannam, Praveen, MD  levalbuterol (XOPENEX) 0.63 MG/3ML nebulizer solution Take 3 mLs (0.63 mg total) by nebulization every 6 (six) hours as needed for wheezing or shortness of breath. Dx:J44.9 03/18/18 03/18/19 Yes Mannam, Praveen, MD  rosuvastatin (CRESTOR) 20 MG tablet TAKE 1 TABLET ONCE DAILY. 09/10/17  Yes Biagio Borg, MD  SPIRIVA RESPIMAT 2.5 MCG/ACT  AERS USE 1 PUFF DAILY AS DIRECTED Patient taking differently: Take 1 puff by mouth daily.  12/26/17  Yes Biagio Borg, MD  SYMBICORT 160-4.5 MCG/ACT inhaler USE 2 PUFFS TWICE DAILY. Patient taking differently: Inhale 2 puffs into the lungs 2 (two) times daily.  10/21/17  Yes Biagio Borg, MD  Respiratory Therapy Supplies (FLUTTER) DEVI Use as directed 08/19/17   Marshell Garfinkel, MD    Physical Exam: Vitals:   04/11/18 1859 04/11/18 2021  BP: 137/82 (!) 156/97  Pulse: (!) 120 (!) 117  Resp: 18 18  Temp: (!) 97.5 F (36.4 C) 98.4 F (36.9 C)  TempSrc:  Oral  SpO2: 100% 99%      Constitutional: Moderately built and nourished. Vitals:   04/11/18 1859 04/11/18 2021  BP: 137/82 (!) 156/97  Pulse: (!) 120 (!) 117  Resp: 18 18  Temp: (!) 97.5 F (36.4 C) 98.4 F (36.9 C)  TempSrc:  Oral  SpO2: 100% 99%   Eyes: Anicteric no pallor. ENMT: No discharge from the ears eyes nose or mouth. Neck: No mass or.  No neck rigidity.  No JVD appreciated. Respiratory: No rhonchi or crepitations.  S1 Cardiovascular: S1-S2 heard tachycardic. Abdomen: Soft nontender bowel sounds present. Musculoskeletal: Right wrist swelling and tenderness. Skin: Right wrist looks swollen and erythematous. Neurologic: Alert awake oriented to time place and person.  Moves all extremities. Psychiatric: Appears normal.  Normal affect.   Labs on Admission: I have personally reviewed following labs and imaging studies  CBC: Recent Labs  Lab 04/11/18 1057  WBC 11.7*  NEUTROABS 9.9*  HGB 12.4  HCT 38.3  MCV 97.9  PLT 829.9   Basic Metabolic Panel: Recent Labs  Lab 04/11/18 1057  NA 135  K 3.6  CL 107  CO2 12*  GLUCOSE 94  BUN 47*  CREATININE 3.31*  CALCIUM 8.6   GFR: Estimated Creatinine Clearance: 11.7 mL/min (A) (by C-G formula based on SCr of 3.31 mg/dL (H)). Liver Function Tests: Recent Labs  Lab 04/11/18 1057  AST 12  ALT 6  ALKPHOS 51  BILITOT 0.4  PROT 7.1  ALBUMIN 3.5   No  results for input(s): LIPASE, AMYLASE in the last 168 hours. No results for input(s): AMMONIA in the last 168 hours. Coagulation Profile: No results for input(s): INR, PROTIME in the last 168 hours. Cardiac Enzymes: No results for input(s): CKTOTAL, CKMB, CKMBINDEX, TROPONINI in the last 168 hours. BNP (last 3 results) No results for input(s): PROBNP in the last 8760 hours. HbA1C: No results for input(s): HGBA1C in the last 72 hours. CBG: No results for input(s): GLUCAP in the last 168 hours. Lipid Profile: No results for input(s): CHOL, HDL, LDLCALC, TRIG, CHOLHDL, LDLDIRECT in the last 72 hours. Thyroid Function Tests: No results for input(s): TSH, T4TOTAL, FREET4, T3FREE, THYROIDAB in the last 72 hours. Anemia Panel: No results for input(s):  VITAMINB12, FOLATE, FERRITIN, TIBC, IRON, RETICCTPCT in the last 72 hours. Urine analysis:    Component Value Date/Time   COLORURINE YELLOW 03/21/2018 1428   APPEARANCEUR Sl Cloud[place] (A) 03/21/2018 1428   LABSPEC >=1.030 (A) 03/21/2018 1428   PHURINE 6.0 03/21/2018 1428   GLUCOSEU NEGATIVE 03/21/2018 1428   HGBUR LARGE (A) 03/21/2018 1428   HGBUR negative 06/20/2010 1406   BILIRUBINUR MODERATE (A) 03/21/2018 1428   KETONESUR 15 (A) 03/21/2018 1428   PROTEINUR 30 (A) 06/18/2016 0048   UROBILINOGEN 0.2 03/21/2018 1428   NITRITE NEGATIVE 03/21/2018 1428   LEUKOCYTESUR NEGATIVE 03/21/2018 1428   Sepsis Labs: @LABRCNTIP (procalcitonin:4,lacticidven:4) )No results found for this or any previous visit (from the past 240 hour(s)).   Radiological Exams on Admission: Dg Wrist 2 Views Right  Result Date: 04/11/2018 CLINICAL DATA:  Pain and swelling EXAM: RIGHT WRIST - 2 VIEW COMPARISON:  06/19/2006 FINDINGS: No acute displaced fracture or malalignment. Moderate arthritis at the first [facility] and STT joints. Mild cartilaginous calcification. Os or old fracture adjacent to the ulnar styloid. Moderate arthritis at the first MCP joint. IMPRESSION: 1. No  acute osseous abnormality. 2. 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 3. Chondrocalcinosis Electronically Signed   By: Donavan Foil M.D.   On: 04/11/2018 21:50    EKG: Independently reviewed.  Sinus tachycardia.  Assessment/Plan Principal Problem:   ARF (acute renal failure) ([facility]) Active Problems:   COPD (chronic obstructive pulmonary disease) ([facility])   Right wrist pain   Essential hypertension   Gallstones    1. Acute renal failure -cause not clear.  Repeat UA has been ordered along with FENa.  Recent renal sonogram done on did not show any definite obstruction.  I have ordered fluid bolus and IV fluid infusion.  Closely follow intake output metabolic panel. 2. Right wrist swelling with markedly elevated CRP and sed rate.  Patient states she has had previous infection of the same wrist few years ago and was treated for infection.  At this time I have ordered blood cultures and started patient on empiric antibiotics.  Other differentials would include rheumatological process.  Check uric acid ANA rheumatoid factor and MRI of the right wrist. 3. SIRS picture -placed on empiric antibiotics IV fluids follow cultures and lactate. 4. COPD not actively wheezing. 5. History of hypertension -holding Cozaar due to renal failure.  PRN IV hydralazine. 6. Sinus tachycardia -could be from some infectious process or patient used to drink alcohol which she has not been doing the last 2 weeks.  Check thyroid function and d-dimer. 7. Gallstones -is poor appetite but denies any abdominal pain.  If patient continues to have poor appetite may consider HIDA scan.   DVT prophylaxis: Heparin. Code Status: Full code. Family Communication: Patient's family at the bedside. Disposition Plan: Home. Consults called: None. Admission status: Inpatient.   Rise Patience MD [facility] Pager [phone].  If 7PM-7AM, please contact night-coverage [email or web address] Password  [email or web address]  04/11/2018, 10:32 PM

## 2018-04-12 ENCOUNTER — Inpatient Hospital Stay (HOSPITAL_COMMUNITY): Payer: Medicare HMO

## 2018-04-12 DIAGNOSIS — K8020 Calculus of gallbladder without cholecystitis without obstruction: Secondary | ICD-10-CM

## 2018-04-12 DIAGNOSIS — J449 Chronic obstructive pulmonary disease, unspecified: Secondary | ICD-10-CM

## 2018-04-12 LAB — HEPATIC FUNCTION PANEL
ALT: 10 U/L (ref 0–44)
AST: 13 U/L — ABNORMAL LOW (ref 15–41)
Albumin: 2.6 g/dL — ABNORMAL LOW (ref 3.5–5.0)
Alkaline Phosphatase: 41 U/L (ref 38–126)
Bilirubin, Direct: 0.1 mg/dL (ref 0.0–0.2)
Indirect Bilirubin: 0.5 mg/dL (ref 0.3–0.9)
Total Bilirubin: 0.6 mg/dL (ref 0.3–1.2)
Total Protein: 5.8 g/dL — ABNORMAL LOW (ref 6.5–8.1)

## 2018-04-12 LAB — TSH: TSH: 1.01 u[IU]/mL (ref 0.350–4.500)

## 2018-04-12 LAB — BASIC METABOLIC PANEL
Anion gap: 15 (ref 5–15)
BUN: 45 mg/dL — ABNORMAL HIGH (ref 8–23)
CO2: 11 mmol/L — ABNORMAL LOW (ref 22–32)
Calcium: 7.6 mg/dL — ABNORMAL LOW (ref 8.9–10.3)
Chloride: 113 mmol/L — ABNORMAL HIGH (ref 98–111)
Creatinine, Ser: 3.11 mg/dL — ABNORMAL HIGH (ref 0.44–1.00)
GFR calc Af Amer: 16 mL/min — ABNORMAL LOW (ref >60)
GFR calc non Af Amer: 14 mL/min — ABNORMAL LOW (ref >60)
Glucose, Bld: 86 mg/dL (ref 70–99)
Potassium: 3.3 mmol/L — ABNORMAL LOW (ref 3.5–5.1)
Sodium: 139 mmol/L (ref 135–145)

## 2018-04-12 LAB — CBC WITH DIFFERENTIAL/PLATELET
Basophils Absolute: 0 10*3/uL (ref 0.0–0.1)
Basophils Relative: 0 %
Eosinophils Absolute: 0.1 10*3/uL (ref 0.0–0.7)
Eosinophils Relative: 1 %
HCT: 34.2 % — ABNORMAL LOW (ref 36.0–46.0)
Hemoglobin: 11 g/dL — ABNORMAL LOW (ref 12.0–15.0)
Lymphocytes Relative: 11 %
Lymphs Abs: 0.8 10*3/uL (ref 0.7–4.0)
MCH: 31.3 pg (ref 26.0–34.0)
MCHC: 32.2 g/dL (ref 30.0–36.0)
MCV: 97.2 fL (ref 78.0–100.0)
Monocytes Absolute: 0.8 10*3/uL (ref 0.1–1.0)
Monocytes Relative: 11 %
Neutro Abs: 5.9 10*3/uL (ref 1.7–7.7)
Neutrophils Relative %: 77 %
Platelets: 233 10*3/uL (ref 150–400)
RBC: 3.52 MIL/uL — ABNORMAL LOW (ref 3.87–5.11)
RDW: 14.1 % (ref 11.5–15.5)
WBC: 7.6 10*3/uL (ref 4.0–10.5)

## 2018-04-12 LAB — D-DIMER, QUANTITATIVE (NOT AT ARMC): D-Dimer, Quant: 3.54 ug/mL-FEU — ABNORMAL HIGH (ref 0.00–0.50)

## 2018-04-12 MED ORDER — SODIUM CHLORIDE 0.9 % IV SOLN
1.0000 g | Freq: Every day | INTRAVENOUS | Status: DC
  Administered 2018-04-12 – 2018-04-13 (×3): 1 g via INTRAVENOUS
  Filled 2018-04-12 (×3): qty 1

## 2018-04-12 MED ORDER — SODIUM CHLORIDE 0.9 % IV SOLN
INTRAVENOUS | Status: DC
  Administered 2018-04-12: 18:00:00 via INTRAVENOUS

## 2018-04-12 MED ORDER — POTASSIUM CHLORIDE CRYS ER 20 MEQ PO TBCR
40.0000 meq | EXTENDED_RELEASE_TABLET | Freq: Once | ORAL | Status: AC
  Administered 2018-04-12: 40 meq via ORAL
  Filled 2018-04-12: qty 2

## 2018-04-12 MED ORDER — THIAMINE HCL 100 MG/ML IJ SOLN
100.0000 mg | Freq: Every day | INTRAMUSCULAR | Status: DC
  Administered 2018-04-12 – 2018-04-13 (×2): 100 mg via INTRAVENOUS
  Filled 2018-04-12 (×2): qty 2

## 2018-04-12 NOTE — Progress Notes (Signed)
PROGRESS NOTE    Andrea Larson  MRN:[id number] DOB: 10/09/1945 DOA: 04/11/2018 PCP: John, James W, MD   Brief Narrative: Andrea Larson is a 72 y.o. female with history of hypertension, COPD, tobacco abuse, hyperlipidemia and alcoholism. She presented with worsening renal function.   Assessment & Plan:   Principal Problem:   ARF (acute renal failure) (HCC) Active Problems:   COPD (chronic obstructive pulmonary disease) (HCC)   Right wrist pain   Essential hypertension   Gallstones   Acute renal failure Unknown etiology. Urine studies pending. Exacerbated by NSAID use most likely. Plans for patient to follow-up with nephrology as an outpatient. -Decrease IV fluids to 75 ml/hr secondary to history of heart dysfunction  Diastolic heart dysfunction Grade 1 diastolic dysfunction  Right wrist pain Concern for inflammatory process. Elevated CRP and ESR. MRI limited but significant for osteoarthritis and chronic ligament injury. Pain improved today. Question of cellulitis. -Continue ceftriaxone  SIRS Ruled out. Only meets 1/4 criteria. No infectious source. Blood cultures obtained. No elevated WBC.  COPD Stable -Continue Xopenex, Spiriva and Dulera  Hypertension Patient is on Cozaar as an outpatient which was apparently discontinued secondary to renal function.  -hydralazine prn  Sinus tachycardia Unknown source. Improved.  Gallstones Asymptomatic.  Hypokalemia Replete.  Hyperlipidemia -Continue Crestor   DVT prophylaxis: Heparin subq Code Status:   Code Status: Full Code Family Communication: None at bedside Disposition Plan: Discharge pending improvement in renal function   Consultants:   None  Procedures:   None  Antimicrobials:  Ceftriaxone    Subjective: Wrist pain improved. Urinating well.  Objective: Vitals:   04/12/18 0118 04/12/18 0438 04/12/18 0833 04/12/18 0836  BP:  (!) 152/83    Pulse: (!) 105 (!) 101    Resp:  18      Temp:  98.6 F (37 C)    TempSrc:  Oral    SpO2:  96% 95% 95%    Intake/Output Summary (Last 24 hours) at 04/12/2018 1249 Last data filed at 04/12/2018 0834 Gross per 24 hour  Intake 1081.71 ml  Output 475 ml  Net 606.71 ml   There were no vitals filed for this visit.  Examination:  General exam: Appears calm and comfortable Respiratory system: Clear to auscultation. Respiratory effort normal. Cardiovascular system: S1 & S2 heard, RRR. No murmurs, rubs, gallops or clicks. Gastrointestinal system: Abdomen is nondistended, soft and nontender. No organomegaly or masses felt. Normal bowel sounds heard. Central nervous system: Alert and oriented. No focal neurological deficits. Extremities: No calf tenderness. Right wrist with erythema and swelling on lateral aspect. Skin: No cyanosis. No rashes Psychiatry: Judgement and insight appear normal. Mood & affect appropriate.     Data Reviewed: I have personally reviewed following labs and imaging studies  CBC: Recent Labs  Lab 04/11/18 1057 04/11/18 2211 04/12/18 0546  WBC 11.7* 9.4 7.6  NEUTROABS 9.9*  --  5.9  HGB 12.4 11.4* 11.0*  HCT 38.3 34.4* 34.2*  MCV 97.9 96.1 97.2  PLT 232.0 222 233   Basic Metabolic Panel: Recent Labs  Lab 04/11/18 1057 04/11/18 2211 04/12/18 0546  NA 135  --  139  K 3.6  --  3.3*  CL 107  --  113*  CO2 12*  --  11*  GLUCOSE 94  --  86  BUN 47*  --  45*  CREATININE 3.31* 3.51* 3.11*  CALCIUM 8.6  --  7.6*   GFR: Estimated Creatinine Clearance: 12.4 mL/min (A) (by C-G formula based on   SCr of 3.11 mg/dL (H)). Liver Function Tests: Recent Labs  Lab 04/11/18 1057 04/12/18 0546  AST 12 13*  ALT 6 10  ALKPHOS 51 41  BILITOT 0.4 0.6  PROT 7.1 5.8*  ALBUMIN 3.5 2.6*   No results for input(s): LIPASE, AMYLASE in the last 168 hours. No results for input(s): AMMONIA in the last 168 hours. Coagulation Profile: No results for input(s): INR, PROTIME in the last 168 hours. Cardiac  Enzymes: No results for input(s): CKTOTAL, CKMB, CKMBINDEX, TROPONINI in the last 168 hours. BNP (last 3 results) No results for input(s): PROBNP in the last 8760 hours. HbA1C: No results for input(s): HGBA1C in the last 72 hours. CBG: No results for input(s): GLUCAP in the last 168 hours. Lipid Profile: No results for input(s): CHOL, HDL, LDLCALC, TRIG, CHOLHDL, LDLDIRECT in the last 72 hours. Thyroid Function Tests: Recent Labs    04/12/18 0546  TSH 1.010   Anemia Panel: No results for input(s): VITAMINB12, FOLATE, FERRITIN, TIBC, IRON, RETICCTPCT in the last 72 hours. Sepsis Labs: Recent Labs  Lab 04/11/18 2211  LATICACIDVEN 0.7    No results found for this or any previous visit (from the past 240 hour(s)).       Radiology Studies: Dg Wrist 2 Views Right  Result Date: 04/11/2018 CLINICAL DATA:  Pain and swelling EXAM: RIGHT WRIST - 2 VIEW COMPARISON:  06/19/2006 FINDINGS: No acute displaced fracture or malalignment. Moderate arthritis at the first [facility] and STT joints. Mild cartilaginous calcification. Os or old fracture adjacent to the ulnar styloid. Moderate arthritis at the first MCP joint. IMPRESSION: 1. No acute osseous abnormality. 2. Marked arthritis of the first [facility] joint and STT joints. 3. Chondrocalcinosis Electronically Signed   By: Donavan Foil M.D.   On: 04/11/2018 21:50   Mr Wrist Right Wo Contrast  Result Date: 04/12/2018 CLINICAL DATA:  Right wrist pain for 2-3 days.  No known injury. EXAM: MR OF THE RIGHT WRIST WITHOUT CONTRAST TECHNIQUE: Multiplanar, multisequence MR imaging of the right wrist was performed. No intravenous contrast was administered. COMPARISON:  Plain films of the right wrist 04/11/2018. FINDINGS: Only a coronal T2 weighted and T1 weighted sequence are provided. The patient was unable to tolerate further scanning. Ligaments: The scapholunate ligament appears torn. Lunotriquetral ligament appears intact. Triangular fibrocartilage: The disc is  severely thinned and degenerated without discrete tear identified. Tendons: Evaluation is limited but no tear is seen. Carpal tunnel/median nerve: Limited evaluation without tear seen. Guyon's canal: Cannot be assessed. Joint/cartilage: Advanced degenerative disease is present at the scaphoid trapezium trapezoid joint and first [place] joint. Bones/carpal alignment: No fracture or focal lesion is identified. Small degenerative cysts in the distal ulna are noted. Small accessory ossicle off the ulnar styloid is also seen. Other: There is a small distal radioulnar joint effusion. Effusion is also seen at the scaphoid trapezium trapezoid joint. IMPRESSION: Severely limited study demonstrating no acute abnormality. 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 Markedly degenerated triangular fibrocartilage without definite tear. Electronically Signed   By: Inge Rise M.D.   On: 04/12/2018 09:30        Scheduled Meds: . feeding supplement (ENSURE ENLIVE)  237 mL Oral BID BM  . FLUoxetine  40 mg Oral Daily  . heparin  5,000 Units Subcutaneous Q8H  . mometasone-formoterol  2 puff Inhalation BID  . potassium chloride  40 mEq Oral Once  . rosuvastatin  20 mg Oral q1800  . thiamine injection  100 mg  Intravenous Daily  . tiotropium  18 mcg Inhalation Daily   Continuous Infusions: . sodium chloride 75 mL/hr at 04/12/18 1148  . cefTRIAXone (ROCEPHIN)  IV 1 g (04/12/18 0054)     LOS: 1 day     Ralph Nettey, MD [facility] 04/12/2018, 12:49 PM Pager: [phone]  If 7PM-7AM, please contact night-coverage [email or web address] 04/12/2018, 12:49 PM  

## 2018-04-13 DIAGNOSIS — E440 Moderate protein-calorie malnutrition: Secondary | ICD-10-CM

## 2018-04-13 LAB — BASIC METABOLIC PANEL
Anion gap: 10 (ref 5–15)
BUN: 36 mg/dL — ABNORMAL HIGH (ref 8–23)
CO2: 12 mmol/L — ABNORMAL LOW (ref 22–32)
Calcium: 7.8 mg/dL — ABNORMAL LOW (ref 8.9–10.3)
Chloride: 118 mmol/L — ABNORMAL HIGH (ref 98–111)
Creatinine, Ser: 2.44 mg/dL — ABNORMAL HIGH (ref 0.44–1.00)
GFR calc Af Amer: 21 mL/min — ABNORMAL LOW (ref >60)
GFR calc non Af Amer: 18 mL/min — ABNORMAL LOW (ref >60)
Glucose, Bld: 90 mg/dL (ref 70–99)
Potassium: 3.4 mmol/L — ABNORMAL LOW (ref 3.5–5.1)
Sodium: 140 mmol/L (ref 135–145)

## 2018-04-13 MED ORDER — VITAMIN B-1 100 MG PO TABS
100.0000 mg | ORAL_TABLET | Freq: Every day | ORAL | Status: DC
  Administered 2018-04-14: 100 mg via ORAL
  Filled 2018-04-13: qty 1

## 2018-04-13 NOTE — Progress Notes (Signed)
Reviewed and agree with management plan.  Malcolm T. Stark, MD FACG 

## 2018-04-13 NOTE — Progress Notes (Signed)
Initial Nutrition Assessment  DOCUMENTATION CODES:   Non-severe (moderate) malnutrition in context of acute illness/injury  INTERVENTION:   Continue Ensure Enlive po BID, each supplement provides 350 kcal and 20 grams of protein  NUTRITION DIAGNOSIS:   Moderate Malnutrition related to acute illness, poor appetite(acute renal failure) as evidenced by percent weight loss, energy intake < 75% for > 7 days.  GOAL:   Patient will meet greater than or equal to 90% of their needs  MONITOR:   PO intake, Supplement acceptance, Labs, Weight trends, I & O's  REASON FOR ASSESSMENT:   Malnutrition Screening Tool    ASSESSMENT:   72 y.o. female with history of hypertension, COPD, tobacco abuse, hyperlipidemia and alcoholism. She presented with worsening renal function  Patient reports poor appetite and not eating well for 2-3 weeks PTA. Pt has had 14 lb of weight loss, this is a 10% wt loss x 1 month, significant for time frame. Pt has been ordered Ensure supplements , will continue.   Medications: IV Thiamine daily Labs reviewed: Low K GFR: 18  NUTRITION - FOCUSED PHYSICAL EXAM:  Nutrition focused physical exam shows no sign of depletion of muscle mass or body fat.  Diet Order:   Diet Order            Diet Heart Room service appropriate? Yes; Fluid consistency: Thin  Diet effective now              EDUCATION NEEDS:   Not appropriate for education at this time  Skin:  Skin Assessment: Reviewed RN Assessment  Last BM:  10/5  Height:   Ht Readings from Last 1 Encounters:  04/12/18 5' (1.524 m)    Weight:   Wt Readings from Last 1 Encounters:  04/11/18 55.4 kg    Ideal Body Weight:  45.5 kg  BMI:  Body mass index is 23.85 kg/m.  Estimated Nutritional Needs:   Kcal:  1400-1600  Protein:  65-75g  Fluid:  1.6L/day  Clayton Bibles, MS, RD, LDN [place] Dietitian Pager: [phone] After Hours Pager: [phone]

## 2018-04-13 NOTE — Progress Notes (Signed)
PROGRESS NOTE    Andrea Larson  TRV:[id number] DOB: [birth year] DOA: 04/11/2018 PCP: Biagio Borg, MD   Brief Narrative: Andrea Larson is a 72 y.o. female with history of hypertension, COPD, tobacco abuse, hyperlipidemia and alcoholism. She presented with worsening renal function.   Assessment & Plan:   Principal Problem:   ARF (acute renal failure) (HCC) Active Problems:   COPD (chronic obstructive pulmonary disease) (HCC)   Right wrist pain   Essential hypertension   Gallstones   Malnutrition of moderate degree   Acute renal failure Unknown etiology. Urine studies pending. Exacerbated by NSAID use most likely. Plans for patient to follow-up with nephrology as an outpatient. Creatinine trending down. Far from previous baseline of 1.38. Currently down to 2.44 -Continue IV fluids  Diastolic heart dysfunction Grade 1 diastolic dysfunction  Right wrist pain Concern for inflammatory process. Elevated CRP and ESR. MRI limited but significant for osteoarthritis and chronic ligament injury. Question of cellulitis. Improved. -Continue ceftriaxone  SIRS Ruled out. Only meets 1/4 criteria. No infectious source. Blood cultures obtained. No elevated WBC.  COPD Stable -Continue Xopenex, Spiriva and Dulera  Hypertension Patient is on Cozaar as an outpatient which was apparently discontinued secondary to renal function.  -hydralazine prn  Sinus tachycardia Unknown source. Improved.  Gallstones Asymptomatic.  Hypokalemia Replete.  Hyperlipidemia -Continue Crestor  Poor PO intake Seen by dietician. Supplements ordered  History of alcohol use Per son.    DVT prophylaxis: Heparin subq Code Status:   Code Status: Full Code Family Communication: Son at bedside Disposition Plan: Discharge pending improvement in renal function and PT eval   Consultants:   None  Procedures:   None  Antimicrobials:  Ceftriaxone    Subjective: Wrist pain resolved. No  issues overnight  Objective: Vitals:   04/13/18 0805 04/13/18 0931 04/13/18 0935 04/13/18 1412  BP:    138/73  Pulse: (!) 111   (!) 107  Resp:    16  Temp:    98.3 F (36.8 C)  TempSrc:    Oral  SpO2:  96% 96% 99%  Height:        Intake/Output Summary (Last 24 hours) at 04/13/2018 1446 Last data filed at 04/13/2018 1000 Gross per 24 hour  Intake 969.93 ml  Output -  Net 969.93 ml   There were no vitals filed for this visit.  Examination:  General exam: Appears calm and comfortable  Respiratory system: Clear to auscultation. Respiratory effort normal. Cardiovascular system: S1 & S2 heard, RRR. No murmurs, rubs, gallops or clicks. Gastrointestinal system: Abdomen is nondistended, soft and nontender. No organomegaly or masses felt. Normal bowel sounds heard. Central nervous system: Alert and oriented. No focal neurological deficits. Extremities: No edema. No calf tenderness Skin: No cyanosis. No rashes Psychiatry: Judgement and insight appear normal. Mood & affect appropriate.      Data Reviewed: I have personally reviewed following labs and imaging studies  CBC: Recent Labs  Lab 04/11/18 1057 04/11/18 2211 04/12/18 0546  WBC 11.7* 9.4 7.6  NEUTROABS 9.9*  --  5.9  HGB 12.4 11.4* 11.0*  HCT 38.3 34.4* 34.2*  MCV 97.9 96.1 97.2  PLT 232.0 222 861   Basic Metabolic Panel: Recent Labs  Lab 04/11/18 1057 04/11/18 2211 04/12/18 0546 04/13/18 0829  NA 135  --  139 140  K 3.6  --  3.3* 3.4*  CL 107  --  113* 118*  CO2 12*  --  11* 12*  GLUCOSE 94  --  86  90  BUN 47*  --  45* 36*  CREATININE 3.31* 3.51* 3.11* 2.44*  CALCIUM 8.6  --  7.6* 7.8*   GFR: Estimated Creatinine Clearance: 15.8 mL/min (A) (by C-G formula based on SCr of 2.44 mg/dL (H)). Liver Function Tests: Recent Labs  Lab 04/11/18 1057 04/12/18 0546  AST 12 13*  ALT 6 10  ALKPHOS 51 41  BILITOT 0.4 0.6  PROT 7.1 5.8*  ALBUMIN 3.5 2.6*   No results for input(s): LIPASE, AMYLASE in the  last 168 hours. No results for input(s): AMMONIA in the last 168 hours. Coagulation Profile: No results for input(s): INR, PROTIME in the last 168 hours. Cardiac Enzymes: No results for input(s): CKTOTAL, CKMB, CKMBINDEX, TROPONINI in the last 168 hours. BNP (last 3 results) No results for input(s): PROBNP in the last 8760 hours. HbA1C: No results for input(s): HGBA1C in the last 72 hours. CBG: No results for input(s): GLUCAP in the last 168 hours. Lipid Profile: No results for input(s): CHOL, HDL, LDLCALC, TRIG, CHOLHDL, LDLDIRECT in the last 72 hours. Thyroid Function Tests: Recent Labs    04/12/18 0546  TSH 1.010   Anemia Panel: No results for input(s): VITAMINB12, FOLATE, FERRITIN, TIBC, IRON, RETICCTPCT in the last 72 hours. Sepsis Labs: Recent Labs  Lab 04/11/18 2211  LATICACIDVEN 0.7    No results found for this or any previous visit (from the past 240 hour(s)).       Radiology Studies: Dg Wrist 2 Views Right  Result Date: 04/11/2018 CLINICAL DATA:  Pain and swelling EXAM: RIGHT WRIST - 2 VIEW COMPARISON:  06/19/2006 FINDINGS: No acute displaced fracture or malalignment. Moderate arthritis at the first [facility] and STT joints. Mild cartilaginous calcification. Os or old fracture adjacent to the ulnar styloid. Moderate arthritis at the first MCP joint. IMPRESSION: 1. No acute osseous abnormality. 2. Marked arthritis of the first [facility] joint and STT joints. 3. Chondrocalcinosis Electronically Signed   By: Donavan Foil M.D.   On: 04/11/2018 21:50   Mr Wrist Right Wo Contrast  Result Date: 04/12/2018 CLINICAL DATA:  Right wrist pain for 2-3 days.  No known injury. EXAM: MR OF THE RIGHT WRIST WITHOUT CONTRAST TECHNIQUE: Multiplanar, multisequence MR imaging of the right wrist was performed. No intravenous contrast was administered. COMPARISON:  Plain films of the right wrist 04/11/2018. FINDINGS: Only a coronal T2 weighted and T1 weighted sequence are provided. The patient was  unable to tolerate further scanning. Ligaments: The scapholunate ligament appears torn. Lunotriquetral ligament appears intact. Triangular fibrocartilage: The disc is severely thinned and degenerated without discrete tear identified. Tendons: Evaluation is limited but no tear is seen. Carpal tunnel/median nerve: Limited evaluation without tear seen. Guyon's canal: Cannot be assessed. Joint/cartilage: Advanced degenerative disease is present at the scaphoid trapezium trapezoid joint and first [employer] joint. Bones/carpal alignment: No fracture or focal lesion is identified. Small degenerative cysts in the distal ulna are noted. Small accessory ossicle off the ulnar styloid is also seen. Other: There is a small distal radioulnar joint effusion. Effusion is also seen at the scaphoid trapezium trapezoid joint. IMPRESSION: Severely limited study demonstrating no acute abnormality. 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 Markedly degenerated triangular fibrocartilage without definite tear. Electronically Signed   By: Inge Rise M.D.   On: 04/12/2018 09:30        Scheduled Meds: . feeding supplement (ENSURE ENLIVE)  237 mL Oral BID BM  . FLUoxetine  40 mg Oral Daily  . heparin  5,000 Units Subcutaneous Q8H  . mometasone-formoterol  2 puff Inhalation BID  . rosuvastatin  20 mg Oral q1800  . [START ON 04/14/2018] thiamine  100 mg Oral Daily  . tiotropium  18 mcg Inhalation Daily   Continuous Infusions: . sodium chloride 75 mL/hr at 04/13/18 0809  . cefTRIAXone (ROCEPHIN)  IV 1 g (04/12/18 2147)     LOS: 2 days     Cordelia Poche, MD [facility] 04/13/2018, 2:46 PM Pager: [phone]  If 7PM-7AM, please contact night-coverage [email or web address] 04/13/2018, 2:46 PM

## 2018-04-13 NOTE — Progress Notes (Signed)
pts HR elevated , no other signs of distress, has been elevated earlier in the day

## 2018-04-13 NOTE — Progress Notes (Signed)
Pt has been ST. Will continue to monitor HR. Kizzie Ide, RN

## 2018-04-13 NOTE — Progress Notes (Signed)
PHARMACIST - PHYSICIAN COMMUNICATION  CONCERNING: IV to Oral Route Change Policy  RECOMMENDATION: This patient is receiving Thiamine by the intravenous route.  Based on criteria approved by the Pharmacy and Therapeutics Committee, the intravenous medication(s) is/are being converted to the equivalent oral dose form(s).   DESCRIPTION: These criteria include:  The patient is eating (either orally or via tube) and/or has been taking other orally administered medications for a least 24 hours  The patient has no evidence of active gastrointestinal bleeding or impaired GI absorption (gastrectomy, short bowel, patient on TNA or NPO).  If you have questions about this conversion, please contact the Pharmacy Department  []   [phone] )  [facility] []   [phone] )  [facility] []   [phone] )  [facility] []   [phone] )  [facility] [x]   [phone] )  [facility]   Lindell Spar [place], [facility] 04/13/2018 12:19 PM

## 2018-04-14 ENCOUNTER — Telehealth: Payer: Self-pay | Admitting: *Deleted

## 2018-04-14 ENCOUNTER — Telehealth: Payer: Self-pay | Admitting: Internal Medicine

## 2018-04-14 DIAGNOSIS — E440 Moderate protein-calorie malnutrition: Secondary | ICD-10-CM

## 2018-04-14 LAB — ANA: Anti Nuclear Antibody(ANA): NEGATIVE

## 2018-04-14 LAB — RHEUMATOID FACTOR: Rhuematoid fact SerPl-aCnc: 12.5 IU/mL (ref 0.0–13.9)

## 2018-04-14 LAB — BASIC METABOLIC PANEL
Anion gap: 9 (ref 5–15)
BUN: 25 mg/dL — ABNORMAL HIGH (ref 8–23)
CO2: 14 mmol/L — ABNORMAL LOW (ref 22–32)
Calcium: 7.6 mg/dL — ABNORMAL LOW (ref 8.9–10.3)
Chloride: 117 mmol/L — ABNORMAL HIGH (ref 98–111)
Creatinine, Ser: 2.05 mg/dL — ABNORMAL HIGH (ref 0.44–1.00)
GFR calc Af Amer: 26 mL/min — ABNORMAL LOW (ref >60)
GFR calc non Af Amer: 23 mL/min — ABNORMAL LOW (ref >60)
Glucose, Bld: 97 mg/dL (ref 70–99)
Potassium: 3.1 mmol/L — ABNORMAL LOW (ref 3.5–5.1)
Sodium: 140 mmol/L (ref 135–145)

## 2018-04-14 LAB — MAGNESIUM: Magnesium: 1.4 mg/dL — ABNORMAL LOW (ref 1.7–2.4)

## 2018-04-14 MED ORDER — CEFDINIR 300 MG PO CAPS: 300.0000 mg | ORAL_CAPSULE | Freq: Every day | ORAL | Status: DC

## 2018-04-14 MED ORDER — ENSURE ENLIVE PO LIQD: 237.0000 mL | Freq: Two times a day (BID) | ORAL | Status: DC

## 2018-04-14 MED ORDER — SACCHAROMYCES BOULARDII 250 MG PO CAPS: 250.0000 mg | ORAL_CAPSULE | Freq: Two times a day (BID) | ORAL | 0 refills | Status: AC

## 2018-04-14 MED ORDER — CEFDINIR 300 MG PO CAPS: 300.0000 mg | ORAL_CAPSULE | Freq: Every day | ORAL | 0 refills | Status: AC

## 2018-04-14 MED ORDER — POTASSIUM CHLORIDE CRYS ER 20 MEQ PO TBCR
40.0000 meq | EXTENDED_RELEASE_TABLET | Freq: Once | ORAL | Status: AC
  Administered 2018-04-14: 40 meq via ORAL
  Filled 2018-04-14: qty 2

## 2018-04-14 MED ORDER — SACCHAROMYCES BOULARDII 250 MG PO CAPS
250.0000 mg | ORAL_CAPSULE | Freq: Two times a day (BID) | ORAL | Status: DC
  Administered 2018-04-14: 250 mg via ORAL
  Filled 2018-04-14: qty 1

## 2018-04-14 NOTE — Discharge Summary (Signed)
Physician Discharge Summary  Andrea Larson IOE:[id number] DOB: 1945/08/09 DOA: 04/11/2018  PCP: Biagio Borg, MD  Admit date: 04/11/2018 Discharge date: 04/14/2018  Admitted From: Home Disposition: Home  Recommendations for Outpatient Follow-up:  1. Follow up with PCP in 1 week 2. Please obtain BMP/CBC in one week 3. Please follow up on the following pending results: None  Home Health: None Equipment/Devices: None  Discharge Condition: Stable CODE STATUS: Full code Diet recommendation: Heart healthy   Brief/Interim Summary:  Admission HPI written by Hosie Poisson, MD   Chief Complaint: Worsening renal function.  HPI: Andrea Larson is a 72 y.o. female with history of hypertension, COPD, tobacco abuse, hyperlipidemia and alcoholism had gone to her primary care physician in August and September with concerns of UTI and at that time patient was found to have progressive renal failure and had an ultrasound of the kidneys which did not show any obstruction but possibility of gallstones and possible medical renal disease eventually was referred to nephrologist.  Patient usually takes Cozaar which was told to be could discontinue along with the NSAIDs.  Patient has follow-up labs scheduled for next week with nephrologist.  Over the last 2 weeks patient has become more weak and poor appetite but denies any chest pain or shortness of breath nausea vomiting or diarrhea.  This patient has benign poor appetite patient had followed up with her gastroenterologist and in the gastroenterologist clinic patient was found to be tachycardic and weak.  Labs revealed "further worsening of renal function.  Patient also was complaining of right wrist pain for the last 2 days and has started taking back to NSAIDs.  CRP done at GI office was 158 which is markedly elevated along with markedly elevated sed rate.  Patient was admitted directly.  On my exam patient is still tachycardic but denies any  chest pain or shortness of breath.  Abdomen is benign on exam.  Patient admitted for acute renal failure and right wrist pain with SIRS picture.   Hospital course:  Acute renal failure Unknown etiology. Urine studies pending. Exacerbated by NSAID use most likely. Plans for patient to follow-up with nephrology as an outpatient. Creatinine trending down. Far from previous baseline of 1.38. Currently down to 2.08 on discharge. Continue oral hydration. Discussed no NSAID use. Follow up with already scheduled nephrology appointment  Diastolic heart dysfunction Grade 1 diastolic dysfunction  Right wrist pain Concern for inflammatory process. Elevated CRP and ESR. MRI limited but significant for osteoarthritis and chronic ligament injury. Question of cellulitis. Improved. Ceftriaxone empirically, transitioned to Cefdinir on discharge.  SIRS Ruled out. Only meets 1/4 criteria. No infectious source. Blood cultures obtained. No elevated WBC.  COPD Stable. Continued Xopenex, Spiriva and Dulera  Hypertension Patient is on Cozaar as an outpatient which was apparently discontinued secondary to renal function. Outpatient follow-up.  Sinus tachycardia Unknown source. Improved. D-dimer high but in setting of inflammation/infection and low test probability for PE. On chart review, patient has a baseline history of tachycardia. TSH WNL. Would consider a cardiology follow-up as an outpatient.  Gallstones Asymptomatic.  Hypokalemia Replete.  Hyperlipidemia Continue Crestor  Weakness Poor PO intake Seen by dietician. Supplements ordered. Poor desire. Recent weight loss. Advised could be secondary to drinking. Would recommend evaluation for depression as an outpatient. Patient worked with PT who recommended no PT follow-up or equipment.  History of alcohol use Per son. Social work consulted.  Discharge Diagnoses:  Principal Problem:   ARF (acute renal failure) (  [place]) Active  Problems:   COPD (chronic obstructive pulmonary disease) ([facility])   Right wrist pain   Essential hypertension   Gallstones   Malnutrition of moderate degree    Discharge Instructions  Discharge Instructions    Increase activity slowly   Complete by:  As directed      Allergies as of 04/14/2018      Reactions   Lipitor [atorvastatin]    myalgias      Medication List    STOP taking these medications   ADVIL 200 MG Caps Generic drug:  Ibuprofen     TAKE these medications   ALPRAZolam 0.25 MG tablet Commonly known as:  XANAX TAKE (1) TABLET TWICE A DAY AS NEEDED. What changed:  See the new instructions.   cefdinir 300 MG capsule Commonly known as:  OMNICEF Take 1 capsule (300 mg total) by mouth at bedtime for 4 days.   feeding supplement (ENSURE ENLIVE) Liqd Take 237 mLs by mouth 2 (two) times daily between meals.   FLUoxetine 40 MG capsule Commonly known as:  PROZAC TAKE (1) CAPSULE DAILY. What changed:  See the new instructions.   FLUTTER Devi Use as directed   levalbuterol 0.63 MG/3ML nebulizer solution Commonly known as:  XOPENEX Take 3 mLs (0.63 mg total) by nebulization every 6 (six) hours as needed for wheezing or shortness of breath. Dx:J44.9   levalbuterol 45 MCG/ACT inhaler Commonly known as:  XOPENEX HFA Inhale 2 puffs into the lungs every 6 (six) hours as needed for wheezing.   rosuvastatin 20 MG tablet Commonly known as:  CRESTOR TAKE 1 TABLET ONCE DAILY.   saccharomyces boulardii 250 MG capsule Commonly known as:  FLORASTOR Take 1 capsule (250 mg total) by mouth 2 (two) times daily for 11 days.   SPIRIVA RESPIMAT 2.5 MCG/ACT Aers Generic drug:  Tiotropium Bromide Monohydrate USE 1 PUFF DAILY AS DIRECTED What changed:  See the new instructions.   SYMBICORT 160-4.5 MCG/ACT inhaler Generic drug:  budesonide-formoterol USE 2 PUFFS TWICE DAILY. What changed:  See the new instructions.      Follow-up Information    Biagio Borg, MD.  Schedule an appointment as soon as possible for a visit in 1 week(s).   Specialties:  Internal Medicine, Radiology Contact information: [address] [place] [address] [phone]          Allergies  Allergen Reactions  . Lipitor [Atorvastatin]     myalgias    Consultations:  None   Procedures/Studies: Dg Wrist 2 Views Right  Result Date: 04/11/2018 CLINICAL DATA:  Pain and swelling EXAM: RIGHT WRIST - 2 VIEW COMPARISON:  06/19/2006 FINDINGS: No acute displaced fracture or malalignment. Moderate arthritis at the first [facility] and STT joints. Mild cartilaginous calcification. Os or old fracture adjacent to the ulnar styloid. Moderate arthritis at the first MCP joint. IMPRESSION: 1. No acute osseous abnormality. 2. Marked arthritis of the first [facility] joint and STT joints. 3. Chondrocalcinosis Electronically Signed   By: Donavan Foil M.D.   On: 04/11/2018 21:50   Mr Wrist Right Wo Contrast  Result Date: 04/12/2018 CLINICAL DATA:  Right wrist pain for 2-3 days.  No known injury. EXAM: MR OF THE RIGHT WRIST WITHOUT CONTRAST TECHNIQUE: Multiplanar, multisequence MR imaging of the right wrist was performed. No intravenous contrast was administered. COMPARISON:  Plain films of the right wrist 04/11/2018. FINDINGS: Only a coronal T2 weighted and T1 weighted sequence are provided. The patient was unable to tolerate further scanning. Ligaments:  The scapholunate ligament appears torn. Lunotriquetral ligament appears intact. Triangular fibrocartilage: The disc is severely thinned and degenerated without discrete tear identified. Tendons: Evaluation is limited but no tear is seen. Carpal tunnel/median nerve: Limited evaluation without tear seen. Guyon's canal: Cannot be assessed. Joint/cartilage: Advanced degenerative disease is present at the scaphoid trapezium trapezoid joint and first [place] joint. Bones/carpal alignment: No fracture or focal lesion is identified. Small degenerative cysts in  the distal ulna are noted. Small accessory ossicle off the ulnar styloid is also seen. Other: There is a small distal radioulnar joint effusion. Effusion is also seen at the scaphoid trapezium trapezoid joint. IMPRESSION: Severely limited study demonstrating no acute abnormality. 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 Markedly degenerated triangular fibrocartilage without definite tear. Electronically Signed   By: Inge Rise M.D.   On: 04/12/2018 09:30      Subjective: No issues  Discharge Exam: Vitals:   04/14/18 0447 04/14/18 0731  BP: 132/68   Pulse: 100   Resp: 18   Temp: 98 F (36.7 C)   SpO2: 98% 97%   Vitals:   04/13/18 2004 04/13/18 2038 04/14/18 0447 04/14/18 0731  BP: 138/90  132/68   Pulse: (!) 112  100   Resp: 20  18   Temp: 98.6 F (37 C)  98 F (36.7 C)   TempSrc: Oral  Oral   SpO2: 99% 99% 98% 97%  Height:        General: Pt is alert, awake, not in acute distress Cardiovascular: RRR, S1/S2 +, no rubs, no gallops Respiratory: CTA bilaterally, no wheezing, no rhonchi Abdominal: Soft, NT, ND, bowel sounds + Extremities: no edema, no cyanosis    The results of significant diagnostics from this hospitalization (including imaging, microbiology, ancillary and laboratory) are listed below for reference.     Microbiology: Recent Results (from the past 240 hour(s))  Culture, blood (routine x 2)     Status: None (Preliminary result)   Collection Time: 04/11/18 10:11 PM  Result Value Ref Range Status   Specimen Description   Final    BLOOD LEFT ANTECUBITAL Performed at [facility] [address], [place], [place] [address]    Special Requests   Final    BOTTLES DRAWN AEROBIC AND ANAEROBIC Blood Culture adequate volume Performed at [facility] [address] [place], [place] [address]    Culture   Final    NO GROWTH 2 DAYS Performed at [facility] [address], [place], [place] [address]    Report Status PENDING  Incomplete  Culture, blood (routine x 2)     Status: None (Preliminary result)   Collection Time: 04/11/18 10:11 PM  Result Value Ref Range Status   Specimen Description   Final    BLOOD LEFT ARM Performed at [facility] [address] [place], [place] [address]    Special Requests   Final    BOTTLES DRAWN AEROBIC ONLY Blood Culture results may not be optimal due to an inadequate volume of blood received in culture bottles Performed at [facility] [address], [place], [place] [address]    Culture   Final    NO GROWTH 2 DAYS Performed at [facility] [address], [place], [place] [address]    Report Status PENDING  Incomplete     Labs: BNP (last 3 results) No results for input(s): BNP in the last 8760 hours.  Basic Metabolic Panel: Recent Labs  Lab 04/11/18 1057 04/11/18 2211 04/12/18 0546 04/13/18 0829 04/14/18 0736  NA 135  --  139 140 140  K 3.6  --  3.3* 3.4* 3.1*  CL 107  --  113* 118* 117*  CO2 12*  --  11* 12* 14*  GLUCOSE 94  --  86 90 97  BUN 47*  --  45* 36* 25*  CREATININE 3.31* 3.51* 3.11* 2.44* 2.05*  CALCIUM 8.6  --  7.6* 7.8* 7.6*   Liver Function Tests: Recent Labs  Lab 04/11/18 1057 04/12/18 0546  AST 12 13*  ALT 6 10  ALKPHOS 51 41  BILITOT 0.4 0.6  PROT 7.1 5.8*  ALBUMIN 3.5 2.6*   No results for input(s): LIPASE, AMYLASE in the last 168 hours. No results for input(s): AMMONIA in the last 168 hours. CBC: Recent Labs  Lab 04/11/18 1057 04/11/18 2211 04/12/18 0546  WBC 11.7* 9.4 7.6  NEUTROABS 9.9*  --  5.9  HGB 12.4 11.4* 11.0*  HCT 38.3 34.4* 34.2*  MCV 97.9 96.1 97.2  PLT 232.0 222 233   Cardiac Enzymes: No results for input(s): CKTOTAL, CKMB, CKMBINDEX, TROPONINI in the last 168 hours. BNP: Invalid input(s): POCBNP CBG: No results for input(s): GLUCAP in the last 168 hours. D-Dimer Recent Labs     04/12/18 0712  DDIMER 3.54*   Hgb A1c No results for input(s): HGBA1C in the last 72 hours. Lipid Profile No results for input(s): CHOL, HDL, LDLCALC, TRIG, CHOLHDL, LDLDIRECT in the last 72 hours. Thyroid function studies Recent Labs    04/12/18 0546  TSH 1.010   Anemia work up No results for input(s): VITAMINB12, FOLATE, FERRITIN, TIBC, IRON, RETICCTPCT in the last 72 hours. Urinalysis    Component Value Date/Time   COLORURINE YELLOW 03/21/2018 1428   APPEARANCEUR [place] (A) 03/21/2018 1428   LABSPEC >=1.030 (A) 03/21/2018 1428   PHURINE 6.0 03/21/2018 1428   GLUCOSEU NEGATIVE 03/21/2018 1428   HGBUR LARGE (A) 03/21/2018 1428   HGBUR negative 06/20/2010 1406   BILIRUBINUR MODERATE (A) 03/21/2018 1428   KETONESUR 15 (A) 03/21/2018 1428   PROTEINUR 30 (A) 06/18/2016 0048   UROBILINOGEN 0.2 03/21/2018 1428   NITRITE NEGATIVE 03/21/2018 1428   LEUKOCYTESUR NEGATIVE 03/21/2018 1428   Sepsis Labs Invalid input(s): PROCALCITONIN,  WBC,  LACTICIDVEN Microbiology Recent Results (from the past 240 hour(s))  Culture, blood (routine x 2)     Status: None (Preliminary result)   Collection Time: 04/11/18 10:11 PM  Result Value Ref Range Status   Specimen Description   Final    BLOOD LEFT ANTECUBITAL Performed at [facility], [place] [address], [place], [place] [address]    Special Requests   Final    BOTTLES DRAWN AEROBIC AND ANAEROBIC Blood Culture adequate volume Performed at [facility] [address], [place], [place] [address]    Culture   Final    NO GROWTH 2 DAYS Performed at [facility] [address], [place], [place] [address]    Report Status PENDING  Incomplete  Culture, blood (routine x 2)     Status: None (Preliminary result)   Collection Time: 04/11/18 10:11 PM  Result Value Ref Range Status   Specimen Description   Final    BLOOD LEFT ARM Performed at [facility] [address]  [place], [place] [address]    Special Requests   Final    BOTTLES DRAWN AEROBIC ONLY Blood  Culture results may not be optimal due to an inadequate volume of blood received in culture bottles Performed at [facility] [address] [place], [place] [address]    Culture   Final    NO GROWTH 2 DAYS Performed at [facility] [address], [place], [place] [address]    Report Status PENDING  Incomplete    SIGNED:   Cordelia Poche, MD [facility] 04/14/2018, 11:04 AM

## 2018-04-14 NOTE — Progress Notes (Signed)
Patient and patient's son stated that they did not want to wait for the social worker. They will follow up with patient's pcp. If social worker wants, they said they can be called at home.

## 2018-04-14 NOTE — Telephone Encounter (Signed)
Pt.notified

## 2018-04-14 NOTE — Evaluation (Signed)
Physical Therapy Evaluation Patient Details Name: Andrea Larson MRN: [id number] DOB: 08-11-1945 Today's Date: 04/14/2018   History of Present Illness  Andrea Larson is a 72 y.o. female with history of hypertension, COPD, tobacco abuse, hyperlipidemia and alcoholism. She presented with worsening renal function.  Clinical Impression  Patient presents with decreased mobility due to weakness with acute illness and bedrest.  Feel she will benefit from skilled PT in the acute setting to allow d/c home likely without follow up PT and no equipment needs.  Currently minguard to S for hallway ambulation.  SpO2 remained 92% or above throughout session, however HR up to 123.  Will follow acutely.    Follow Up Recommendations No PT follow up;Home health PT;Other (comment)(depending on progress)    Equipment Recommendations  None recommended by PT    Recommendations for Other Services       Precautions / Restrictions Precautions Precautions: Fall      Mobility  Bed Mobility Overal bed mobility: Needs Assistance Bed Mobility: Sit to Supine       Sit to supine: Supervision;HOB elevated   General bed mobility comments: increased time, cues for positioning  Transfers Overall transfer level: Needs assistance Equipment used: None Transfers: Sit to/from Stand   Stand pivot transfers: Supervision       General transfer comment: no UE assist or device  Ambulation/Gait Ambulation/Gait assistance: Supervision;Min guard Gait Distance (Feet): 150 Feet Assistive device: None Gait Pattern/deviations: Step-to pattern;Decreased stride length;Shuffle     General Gait Details: short shuffling steps with no device and no LOB, occasional minguard for safetu  Stairs            Wheelchair Mobility    Modified Rankin (Stroke Patients Only)       Balance Overall balance assessment: Needs assistance Sitting-balance support: No upper extremity supported Sitting balance-Leahy  Scale: Good       Standing balance-Leahy Scale: Poor Standing balance comment: toileted in bathroom with distant S                             Pertinent Vitals/Pain Pain Assessment: No/denies pain    Home Living Family/patient expects to be discharged to:: Private residence Living Arrangements: Non-relatives/Friends Available Help at Discharge: Friend(s);Available 24 hours/day Type of Home: House Home Access: Stairs to enter Entrance Stairs-Rails: Right Entrance Stairs-Number of Steps: 1 Home Layout: One level Home Equipment: None      Prior Function Level of Independence: Independent         Comments: no difficulties with mobility and no falls per pt, able to get go out to eat, etc     Hand Dominance        Extremity/Trunk Assessment   Upper Extremity Assessment Upper Extremity Assessment: RUE deficits/detail RUE Deficits / Details: some stiffness still noted in R wrist though patient reports pain has resolved    Lower Extremity Assessment Lower Extremity Assessment: Generalized weakness    Cervical / Trunk Assessment Cervical / Trunk Assessment: Kyphotic;Other exceptions Cervical / Trunk Exceptions: scoliotic  Communication   Communication: No difficulties  Cognition Arousal/Alertness: Awake/alert Behavior During Therapy: WFL for tasks assessed/performed Overall Cognitive Status: Within Functional Limits for tasks assessed                                        General Comments  Exercises     Assessment/Plan    PT Assessment Patient needs continued PT services  PT Problem List Decreased mobility;Decreased strength;Decreased safety awareness;Decreased activity tolerance;Decreased balance;Decreased knowledge of use of DME;Impaired sensation       PT Treatment Interventions Therapeutic activities;DME instruction;Therapeutic exercise;Patient/family education;Functional mobility training;Neuromuscular  re-education;Balance training    PT Goals (Current goals can be found in the Care Plan section)  Acute Rehab PT Goals Patient Stated Goal: to go home PT Goal Formulation: With patient Time For Goal Achievement: 04/19/18 Potential to Achieve Goals: Good    Frequency Min 3X/week   Barriers to discharge        Co-evaluation               AM-PAC PT "6 Clicks" Daily Activity  Outcome Measure Difficulty turning over in bed (including adjusting bedclothes, sheets and blankets)?: A Little Difficulty moving from lying on back to sitting on the side of the bed? : A Little Difficulty sitting down on and standing up from a chair with arms (e.g., wheelchair, bedside commode, etc,.)?: A Little Help needed moving to and from a bed to chair (including a wheelchair)?: A Little Help needed walking in hospital room?: A Little Help needed climbing 3-5 steps with a railing? : A Little 6 Click Score: 18    End of Session Equipment Utilized During Treatment: Gait belt Activity Tolerance: Patient tolerated treatment well Patient left: in bed;with call bell/phone within reach Nurse Communication: Other (comment) PT Visit Diagnosis: Other abnormalities of gait and mobility (R26.89);Muscle weakness (generalized) (M62.81)    Time: 6578-4696 PT Time Calculation (min) (ACUTE ONLY): 20 min   Charges:   PT Evaluation $PT Eval Low Complexity: [place], [place] Acute Rehabilitation Services [phone] 04/14/2018   Reginia Naas 04/14/2018, 11:38 AM

## 2018-04-14 NOTE — Telephone Encounter (Signed)
I am not currently accepting new patients and I generally do not accept transfer patients.

## 2018-04-14 NOTE — Telephone Encounter (Signed)
Copied from Hand [id number]. Topic: Appointment Scheduling - Scheduling Inquiry for Clinic >> Apr 14, 2018  2:21 PM Percell Belt A wrote: Reason for CRM:   Orinda Kenner called in and would like to see if Dr burns would be willing to take this pt on as a new pt.  Pt would like to transfer from [facility] to [facility].  Please advise   Best number  [phone] or  [phone]

## 2018-04-14 NOTE — Discharge Instructions (Addendum)
Andrea Larson,  You were admitted because of worsened kidney function and right hand pain  1. Kidney: your function has improved. Please follow-up with your primary care physician and your nephrologist. Please continue hydrating yourself. Please stop alcohol use.  2. Right hand pain: this seems secondary to an infection. You were treated with an antibiotic which has improved your symptoms.  3. Fast heart rate: you have been asymptomatic and have had this issue in the past. Please follow-up with your primary care physicians. If you have palpitations, difficulty breathing, chest pain, leg swelling, please seek immediate medical evaluation.

## 2018-04-14 NOTE — Telephone Encounter (Signed)
Tried calling pt to set-up TCM Hosp f/u appt. No answer and can't leave msg due to vm being full. Will retry later.Marland KitchenJohny Larson

## 2018-04-16 NOTE — Telephone Encounter (Signed)
Called pt/sister again to make hosp follow-up appt. Per sister she called yesterday to schedule. Verified appt made for 04/21/18. Inform sister had some additional questions concerning discharge. Completed TCM below../lm,b  Transition Care Management Follow-up Telephone Call   Date discharged? 04/14/18   How have you been since you were released from the hospital? Per sister (Andrea Larson) pt is doing ok   Do you understand why you were in the hospital? YES   Do you understand the discharge instruction? YES   Where were you discharged to? Home   Items Reviewed:  Medications reviewed: YES  Allergies reviewed: YES  Dietary changes reviewed: YES, heart healthy  Referrals reviewed: No referral needed   Functional Questionnaire:   Activities of Daily Living (ADLs):   She states she are independent in the following: ambulation, bathing and hygiene, feeding, continence, grooming, toileting and dressing States she doesn't require assistance    Any transportation issues/concerns?: NO   Any patient concerns? NO   Confirmed importance and date/time of follow-up visits scheduled YES, appt 04/21/18  Provider Appointment booked with Dr. Jenny Reichmann   Confirmed with patient if condition begins to worsen call PCP or go to the ER.  Patient was given the office number and encouraged to call back with question or concerns.  : YES

## 2018-04-17 LAB — CULTURE, BLOOD (ROUTINE X 2)
Culture: NO GROWTH
Culture: NO GROWTH
Special Requests: ADEQUATE

## 2018-04-21 ENCOUNTER — Ambulatory Visit (INDEPENDENT_AMBULATORY_CARE_PROVIDER_SITE_OTHER): Payer: Medicare HMO | Admitting: Internal Medicine

## 2018-04-21 ENCOUNTER — Other Ambulatory Visit: Payer: Self-pay | Admitting: Internal Medicine

## 2018-04-21 ENCOUNTER — Other Ambulatory Visit (INDEPENDENT_AMBULATORY_CARE_PROVIDER_SITE_OTHER): Payer: Medicare HMO

## 2018-04-21 ENCOUNTER — Encounter: Payer: Self-pay | Admitting: Internal Medicine

## 2018-04-21 VITALS — BP 110/68 | HR 62 | Temp 97.6°F | Ht 60.0 in | Wt 121.0 lb

## 2018-04-21 DIAGNOSIS — K521 Toxic gastroenteritis and colitis: Secondary | ICD-10-CM | POA: Diagnosis not present

## 2018-04-21 DIAGNOSIS — F341 Dysthymic disorder: Secondary | ICD-10-CM

## 2018-04-21 DIAGNOSIS — N179 Acute kidney failure, unspecified: Secondary | ICD-10-CM

## 2018-04-21 DIAGNOSIS — I10 Essential (primary) hypertension: Secondary | ICD-10-CM | POA: Diagnosis not present

## 2018-04-21 DIAGNOSIS — E440 Moderate protein-calorie malnutrition: Secondary | ICD-10-CM

## 2018-04-21 DIAGNOSIS — M25531 Pain in right wrist: Secondary | ICD-10-CM | POA: Diagnosis not present

## 2018-04-21 LAB — CBC WITH DIFFERENTIAL/PLATELET
Basophils Absolute: 0.1 10*3/uL (ref 0.0–0.1)
Basophils Relative: 0.5 % (ref 0.0–3.0)
Eosinophils Absolute: 0.4 10*3/uL (ref 0.0–0.7)
Eosinophils Relative: 2.5 % (ref 0.0–5.0)
HCT: 35.9 % — ABNORMAL LOW (ref 36.0–46.0)
Hemoglobin: 11.8 g/dL — ABNORMAL LOW (ref 12.0–15.0)
Lymphocytes Relative: 8.2 % — ABNORMAL LOW (ref 12.0–46.0)
Lymphs Abs: 1.2 10*3/uL (ref 0.7–4.0)
MCHC: 32.7 g/dL (ref 30.0–36.0)
MCV: 96 fl (ref 78.0–100.0)
Monocytes Absolute: 1.2 10*3/uL — ABNORMAL HIGH (ref 0.1–1.0)
Monocytes Relative: 8.3 % (ref 3.0–12.0)
Neutro Abs: 11.7 10*3/uL — ABNORMAL HIGH (ref 1.4–7.7)
Neutrophils Relative %: 80.5 % — ABNORMAL HIGH (ref 43.0–77.0)
Platelets: 307 10*3/uL (ref 150.0–400.0)
RBC: 3.74 Mil/uL — ABNORMAL LOW (ref 3.87–5.11)
RDW: 14.3 % (ref 11.5–15.5)
WBC: 14.6 10*3/uL — ABNORMAL HIGH (ref 4.0–10.5)

## 2018-04-21 LAB — BASIC METABOLIC PANEL
BUN: 33 mg/dL — ABNORMAL HIGH (ref 6–23)
CO2: 24 mEq/L (ref 19–32)
Calcium: 8.4 mg/dL (ref 8.4–10.5)
Chloride: 103 mEq/L (ref 96–112)
Creatinine, Ser: 1.52 mg/dL — ABNORMAL HIGH (ref 0.40–1.20)
GFR: 35.47 mL/min — ABNORMAL LOW (ref >60.00)
Glucose, Bld: 136 mg/dL — ABNORMAL HIGH (ref 70–99)
Potassium: 3 mEq/L — ABNORMAL LOW (ref 3.5–5.1)
Sodium: 134 mEq/L — ABNORMAL LOW (ref 135–145)

## 2018-04-21 MED ORDER — ENSURE ENLIVE PO LIQD: 237.0000 mL | Freq: Three times a day (TID) | ORAL | Status: DC

## 2018-04-21 MED ORDER — BUPROPION HCL ER (XL) 150 MG PO TB24: 150.0000 mg | ORAL_TABLET | Freq: Every day | ORAL | 3 refills | Status: DC

## 2018-04-21 MED ORDER — DIPHENOXYLATE-ATROPINE 2.5-0.025 MG PO TABS: 1.0000 | ORAL_TABLET | Freq: Four times a day (QID) | ORAL | 0 refills | Status: DC | PRN

## 2018-04-21 MED ORDER — POTASSIUM CHLORIDE ER 10 MEQ PO TBCR: 20.0000 meq | EXTENDED_RELEASE_TABLET | Freq: Every day | ORAL | 0 refills | Status: DC

## 2018-04-21 NOTE — Assessment & Plan Note (Signed)
?   Better today, finished antibx and right hand doing well, for lomotil pain, restart florastor, f/u any worsening s/s such as fever, pain, blood.

## 2018-04-21 NOTE — Assessment & Plan Note (Signed)
stable overall by history and exam, recent data reviewed with pt, and pt to continue medical treatment as before,  to f/u any worsening symptoms or concerns  

## 2018-04-21 NOTE — Assessment & Plan Note (Signed)
[place] for increased ensure to 3 can per day

## 2018-04-21 NOTE — Assessment & Plan Note (Signed)
C/w severe DJD, pain much improved, declines hand surgury referral

## 2018-04-21 NOTE — Patient Instructions (Addendum)
Please take all new medication as prescribed - the wellbutrin ER 150 per day  You will be contacted regarding the referral for: Psychiatry  Please take all new medication as prescribed - the lomotil  Please continue all other medications as before, including restart of the florastor  OK to increase the ensure to 3 cans per day  Please have the pharmacy call with any other refills you may need.  Please keep your appointments with your specialists as you may have planned  Please go to the LAB in the Basement (turn left off the elevator) for the tests to be done today  You will be contacted by phone if any changes need to be made immediately.  Otherwise, you will receive a letter about your results with an explanation, but please check with MyChart first.  Please remember to sign up for MyChart if you have not done so, as this will be important to you in the future with finding out test results, communicating by private email, and scheduling acute appointments online when needed.  Please return in 2 months, or sooner if needed

## 2018-04-21 NOTE — Assessment & Plan Note (Signed)
Etiology unclear, most recently appears to have had some post antibx diarrhea now improved but could exacerbate the lower volume and possible related AKI

## 2018-04-21 NOTE — Assessment & Plan Note (Signed)
Suspected to be the primary issue ? with others secondary (ETOH use, malnutrition, wt loss, AKI and diarrhea); today to add wellbutrin XL 150 qd, refer psychiatry (though it is not clear she will go)

## 2018-04-21 NOTE — Progress Notes (Signed)
Subjective:    Patient ID: Andrea Larson, female    DOB: March 11, 1946, 72 y.o.   MRN: [id number]  HPI 72 y.o.femalewithher brother and duaghter, with history of hypertension, COPD, tobacco abuse, hyperlipidemia and alcoholism had with concerns of UTI previously and at that time patient was found to have progressive renal failure and had an ultrasound of the kidneys which did not show any obstruction but possibility of gallstones and possible medical renal disease eventually was referred to nephrologist. Patient usually takes Cozaar which was told to be could discontinue along with the NSAIDs.  Over 2 weeks patient had become more weak and poor appetite but denies any chest pain or shortness of breath nausea vomiting or diarrhea. This patient has benign poor appetite patient had followed up with her gastroenterologist and was found to be tachycardic and weak. Labs revealed further worsening of renal function. Patient also was complaining of right wrist pain for the last 2 days and has started taking back to NSAIDs. CRP done at GI office was 158 which is markedly elevated along with markedly elevated sed rate.  Was seen per renal with insufficiency unknown etiology. Urine studies pending at time of d/c. Felt to be exacerbated by NSAID use as least. Plans for patient to follow-up with nephrology as an outpatient.Creatinine was trending down at time of d/c. Though far from previous baseline of 1.38. Was down to 2.08 on discharge. Pt advised to continue oral hydration. Discussed no NSAID use.  Did have some diarrhea post antibx.  Treated for possible cellulitis right hand now improved.   Family states they think she is dehydrated again.  Pt can walk from her bedroom to BR by holding onto walls.  No falls.Stopped her florastor due to thinking it was causing the diarrhea which persists post antibx, though incidentally better today?  Taking 2 ensure per day, but willing to go to 3 Wt Readings from Last 3  Encounters:  04/21/18 121 lb (54.9 kg)  04/11/18 122 lb 2 oz (55.4 kg)  03/21/18 136 lb (61.7 kg)  Remains chronically depressed.  Resistant to change in tx or referral, but accepts today in front of family.  Denies SI or HI.   Past Medical History:  Diagnosis Date  . Allergic rhinitis   . Allergic rhinitis, cause unspecified 09/21/2011  . Aortic stenosis 08/23/2016  . Arthritis   . Asthma   . Asthma 09/21/2011  . Colon polyps   . COPD (chronic obstructive pulmonary disease) (HCC)    mild   . Coronary artery calcification 08/23/2016  . Hyperlipidemia   . UTI (lower urinary tract infection)    Past Surgical History:  Procedure Laterality Date  . DENTAL SURGERY    . pilonidial cyst    . TONSILLECTOMY      reports that she quit smoking about 3 years ago. Her smoking use included cigarettes. She has a 41.25 pack-year smoking history. She has never used smokeless tobacco. She reports that she drinks alcohol. She reports that she does not use drugs. family history includes Alcohol abuse in her other; Arthritis in her other; Atrial fibrillation in her brother; Heart disease in her other; Hypertension in her father and other; Stroke in her father and other. Allergies  Allergen Reactions  . Lipitor [Atorvastatin]     myalgias   Current Outpatient Medications on File Prior to Visit  Medication Sig Dispense Refill  . ALPRAZolam (XANAX) 0.25 MG tablet TAKE (1) TABLET TWICE A DAY AS NEEDED. (Patient taking differently:  Take 0.25 mg by mouth 2 (two) times daily as needed for anxiety. ) 60 tablet 2  . FLUoxetine (PROZAC) 40 MG capsule TAKE (1) CAPSULE DAILY. (Patient taking differently: Take 40 mg by mouth daily. ) 90 capsule 3  . levalbuterol (XOPENEX HFA) 45 MCG/ACT inhaler Inhale 2 puffs into the lungs every 6 (six) hours as needed for wheezing. 1 Inhaler 12  . levalbuterol (XOPENEX) 0.63 MG/3ML nebulizer solution Take 3 mLs (0.63 mg total) by nebulization every 6 (six) hours as needed for  wheezing or shortness of breath. Dx:J44.9 3 mL 2  . Respiratory Therapy Supplies (FLUTTER) DEVI Use as directed 1 each 0  . rosuvastatin (CRESTOR) 20 MG tablet TAKE 1 TABLET ONCE DAILY. 90 tablet 3  . saccharomyces boulardii (FLORASTOR) 250 MG capsule Take 1 capsule (250 mg total) by mouth 2 (two) times daily for 11 days. 22 capsule 0  . SPIRIVA RESPIMAT 2.5 MCG/ACT AERS USE 1 PUFF DAILY AS DIRECTED (Patient taking differently: Take 1 puff by mouth daily. ) 4 g 5  . SYMBICORT 160-4.5 MCG/ACT inhaler USE 2 PUFFS TWICE DAILY. (Patient taking differently: Inhale 2 puffs into the lungs 2 (two) times daily. ) 10.2 g 2   No current facility-administered medications on file prior to visit.    Review of Systems  Constitutional: Negative for other unusual diaphoresis or sweats HENT: Negative for ear discharge or swelling Eyes: Negative for other worsening visual disturbances Respiratory: Negative for stridor or other swelling  Gastrointestinal: Negative for worsening distension or other blood Genitourinary: Negative for retention or other urinary change Musculoskeletal: Negative for other MSK pain or swelling Skin: Negative for color change or other new lesions Neurological: Negative for worsening tremors and other numbness  Psychiatric/Behavioral: Negative for worsening agitation or other fatigue ALl other system neg per pt    Objective:   Physical Exam BP 110/68   Pulse 62   Temp 97.6 F (36.4 C) (Oral)   Ht 5' (1.524 m)   Wt 121 lb (54.9 kg)   SpO2 94%   BMI 23.63 kg/m  VS noted,  Constitutional: Pt appears in NAD HENT: Head: NCAT.  Right Ear: External ear normal.  Left Ear: External ear normal.  Eyes: . Pupils are equal, round, and reactive to light. Conjunctivae and EOM are normal Nose: without d/c or deformity Neck: Neck supple. Gross normal ROM Cardiovascular: Normal rate and regular rhythm.   Pulmonary/Chest: Effort normal and breath sounds without rales or wheezing.  Abd:   Soft, NT, ND, + BS, no organomegaly Neurological: Pt is alert. At baseline orientation, motor grossly intact Skin: Skin is warm. No rashes, other new lesions, no LE edema Psychiatric: Pt behavior is normal without agitation  but irritable and depressed affect,  No other exam findings Lab Results  Component Value Date   WBC 14.6 (H) 04/21/2018   HGB 11.8 (L) 04/21/2018   HCT 35.9 (L) 04/21/2018   PLT 307.0 04/21/2018   GLUCOSE 136 (H) 04/21/2018   CHOL 205 (H) 02/18/2018   TRIG 80.0 02/18/2018   HDL 132.60 02/18/2018   LDLDIRECT 124.8 09/21/2011   LDLCALC 56 02/18/2018   ALT 10 04/12/2018   AST 13 (L) 04/12/2018   NA 134 (L) 04/21/2018   K 3.0 (L) 04/21/2018   CL 103 04/21/2018   CREATININE 1.52 (H) 04/21/2018   BUN 33 (H) 04/21/2018   CO2 24 04/21/2018   TSH 1.010 04/12/2018   HGBA1C 5.3 02/18/2018        Assessment &  Plan:

## 2018-04-22 ENCOUNTER — Telehealth: Payer: Self-pay

## 2018-04-22 NOTE — Telephone Encounter (Signed)
Called pt to discuss lab results, VM can not accept any additional messages since the VM is full.  CRM created.

## 2018-04-22 NOTE — Telephone Encounter (Signed)
-----   Message from Biagio Borg, MD sent at 04/21/2018  8:14 PM EDT ----- Left message on MyChart, pt to cont same tx except  The test results show that your current treatment is OK, although the sodium and potassium are mildly low, the Kidney Function is improved (close to where she started), slight anemia is stable, and the White Blood Cells are mildly elevated today for unclear reason.  We need to address the low potassium by adding a prescription for potassium for 5 days (I will send a prescription), please keep up with fluids as you are trying to do, take the lomotil if needed for diarrhea, and we will need to follow up on the stool testing as well to look for infection.   Yajayra Feldt to please inform pt, I will do rx for K

## 2018-04-28 ENCOUNTER — Other Ambulatory Visit: Payer: Self-pay

## 2018-04-28 ENCOUNTER — Emergency Department (HOSPITAL_COMMUNITY): Payer: Medicare HMO

## 2018-04-28 ENCOUNTER — Inpatient Hospital Stay (HOSPITAL_COMMUNITY)
Admission: EM | Admit: 2018-04-28 | Discharge: 2018-05-06 | DRG: 372 | Disposition: A | Discharge status: Home / Self Care | Payer: Medicare HMO | Attending: Internal Medicine | Admitting: Internal Medicine

## 2018-04-28 ENCOUNTER — Encounter (HOSPITAL_COMMUNITY): Payer: Self-pay

## 2018-04-28 DIAGNOSIS — D509 Iron deficiency anemia, unspecified: Secondary | ICD-10-CM | POA: Diagnosis present

## 2018-04-28 DIAGNOSIS — F419 Anxiety disorder, unspecified: Secondary | ICD-10-CM | POA: Diagnosis present

## 2018-04-28 DIAGNOSIS — R0602 Shortness of breath: Secondary | ICD-10-CM | POA: Diagnosis not present

## 2018-04-28 DIAGNOSIS — F329 Major depressive disorder, single episode, unspecified: Secondary | ICD-10-CM | POA: Diagnosis present

## 2018-04-28 DIAGNOSIS — K219 Gastro-esophageal reflux disease without esophagitis: Secondary | ICD-10-CM | POA: Diagnosis present

## 2018-04-28 DIAGNOSIS — N1831 Chronic kidney disease, stage 3a: Secondary | ICD-10-CM

## 2018-04-28 DIAGNOSIS — F1011 Alcohol abuse, in remission: Secondary | ICD-10-CM | POA: Diagnosis present

## 2018-04-28 DIAGNOSIS — R1111 Vomiting without nausea: Secondary | ICD-10-CM | POA: Diagnosis not present

## 2018-04-28 DIAGNOSIS — Z87440 Personal history of urinary (tract) infections: Secondary | ICD-10-CM

## 2018-04-28 DIAGNOSIS — Z87891 Personal history of nicotine dependence: Secondary | ICD-10-CM | POA: Diagnosis not present

## 2018-04-28 DIAGNOSIS — J449 Chronic obstructive pulmonary disease, unspecified: Secondary | ICD-10-CM | POA: Diagnosis present

## 2018-04-28 DIAGNOSIS — R32 Unspecified urinary incontinence: Secondary | ICD-10-CM | POA: Diagnosis present

## 2018-04-28 DIAGNOSIS — I10 Essential (primary) hypertension: Secondary | ICD-10-CM | POA: Diagnosis not present

## 2018-04-28 DIAGNOSIS — N183 Chronic kidney disease, stage 3 unspecified: Secondary | ICD-10-CM

## 2018-04-28 DIAGNOSIS — A0472 Enterocolitis due to Clostridium difficile, not specified as recurrent: Principal | ICD-10-CM | POA: Diagnosis present

## 2018-04-28 DIAGNOSIS — D539 Nutritional anemia, unspecified: Secondary | ICD-10-CM | POA: Diagnosis present

## 2018-04-28 DIAGNOSIS — Z7951 Long term (current) use of inhaled steroids: Secondary | ICD-10-CM | POA: Diagnosis not present

## 2018-04-28 DIAGNOSIS — R197 Diarrhea, unspecified: Secondary | ICD-10-CM | POA: Diagnosis present

## 2018-04-28 DIAGNOSIS — Z8249 Family history of ischemic heart disease and other diseases of the circulatory system: Secondary | ICD-10-CM | POA: Diagnosis not present

## 2018-04-28 DIAGNOSIS — E785 Hyperlipidemia, unspecified: Secondary | ICD-10-CM | POA: Diagnosis not present

## 2018-04-28 DIAGNOSIS — R0902 Hypoxemia: Secondary | ICD-10-CM | POA: Diagnosis not present

## 2018-04-28 DIAGNOSIS — I350 Nonrheumatic aortic (valve) stenosis: Secondary | ICD-10-CM | POA: Diagnosis present

## 2018-04-28 DIAGNOSIS — I129 Hypertensive chronic kidney disease with stage 1 through stage 4 chronic kidney disease, or unspecified chronic kidney disease: Secondary | ICD-10-CM | POA: Diagnosis not present

## 2018-04-28 DIAGNOSIS — I2510 Atherosclerotic heart disease of native coronary artery without angina pectoris: Secondary | ICD-10-CM | POA: Diagnosis present

## 2018-04-28 DIAGNOSIS — J441 Chronic obstructive pulmonary disease with (acute) exacerbation: Secondary | ICD-10-CM | POA: Diagnosis not present

## 2018-04-28 DIAGNOSIS — Z86010 Personal history of colonic polyps: Secondary | ICD-10-CM

## 2018-04-28 DIAGNOSIS — R634 Abnormal weight loss: Secondary | ICD-10-CM | POA: Diagnosis present

## 2018-04-28 DIAGNOSIS — K5730 Diverticulosis of large intestine without perforation or abscess without bleeding: Secondary | ICD-10-CM | POA: Diagnosis present

## 2018-04-28 DIAGNOSIS — Z79899 Other long term (current) drug therapy: Secondary | ICD-10-CM | POA: Diagnosis not present

## 2018-04-28 DIAGNOSIS — A09 Infectious gastroenteritis and colitis, unspecified: Secondary | ICD-10-CM | POA: Diagnosis not present

## 2018-04-28 DIAGNOSIS — R110 Nausea: Secondary | ICD-10-CM | POA: Diagnosis not present

## 2018-04-28 DIAGNOSIS — R531 Weakness: Secondary | ICD-10-CM | POA: Diagnosis not present

## 2018-04-28 DIAGNOSIS — Z888 Allergy status to other drugs, medicaments and biological substances status: Secondary | ICD-10-CM

## 2018-04-28 LAB — CBC WITH DIFFERENTIAL/PLATELET
Abs Immature Granulocytes: 0.06 10*3/uL (ref 0.00–0.07)
Basophils Absolute: 0.1 10*3/uL (ref 0.0–0.1)
Basophils Relative: 1 %
Eosinophils Absolute: 0.2 10*3/uL (ref 0.0–0.5)
Eosinophils Relative: 2 %
HCT: 32.9 % — ABNORMAL LOW (ref 36.0–46.0)
Hemoglobin: 10.1 g/dL — ABNORMAL LOW (ref 12.0–15.0)
Immature Granulocytes: 1 %
Lymphocytes Relative: 13 %
Lymphs Abs: 1 10*3/uL (ref 0.7–4.0)
MCH: 31 pg (ref 26.0–34.0)
MCHC: 30.7 g/dL (ref 30.0–36.0)
MCV: 100.9 fL — ABNORMAL HIGH (ref 80.0–100.0)
Monocytes Absolute: 1 10*3/uL (ref 0.1–1.0)
Monocytes Relative: 13 %
Neutro Abs: 5.3 10*3/uL (ref 1.7–7.7)
Neutrophils Relative %: 70 %
Platelets: 345 10*3/uL (ref 150–400)
RBC: 3.26 MIL/uL — ABNORMAL LOW (ref 3.87–5.11)
RDW: 13.7 % (ref 11.5–15.5)
WBC: 7.5 10*3/uL (ref 4.0–10.5)
nRBC: 0 % (ref 0.0–0.2)

## 2018-04-28 LAB — COMPREHENSIVE METABOLIC PANEL
ALT: 14 U/L (ref 0–44)
AST: 16 U/L (ref 15–41)
Albumin: 2.3 g/dL — ABNORMAL LOW (ref 3.5–5.0)
Alkaline Phosphatase: 53 U/L (ref 38–126)
Anion gap: 8 (ref 5–15)
BUN: 19 mg/dL (ref 8–23)
CO2: 23 mmol/L (ref 22–32)
Calcium: 8.3 mg/dL — ABNORMAL LOW (ref 8.9–10.3)
Chloride: 107 mmol/L (ref 98–111)
Creatinine, Ser: 1.22 mg/dL — ABNORMAL HIGH (ref 0.44–1.00)
GFR calc Af Amer: 49 mL/min — ABNORMAL LOW (ref >60)
GFR calc non Af Amer: 43 mL/min — ABNORMAL LOW (ref >60)
Glucose, Bld: 84 mg/dL (ref 70–99)
Potassium: 4.3 mmol/L (ref 3.5–5.1)
Sodium: 138 mmol/L (ref 135–145)
Total Bilirubin: 0.7 mg/dL (ref 0.3–1.2)
Total Protein: 5.7 g/dL — ABNORMAL LOW (ref 6.5–8.1)

## 2018-04-28 LAB — LIPASE, BLOOD: Lipase: 18 U/L (ref 11–51)

## 2018-04-28 MED ORDER — SODIUM CHLORIDE 0.9 % IV SOLN
INTRAVENOUS | Status: DC
  Administered 2018-04-28 – 2018-05-01 (×7): via INTRAVENOUS

## 2018-04-28 MED ORDER — METHYLPREDNISOLONE SODIUM SUCC 125 MG IJ SOLR
125.0000 mg | Freq: Once | INTRAMUSCULAR | Status: AC
  Administered 2018-04-28: 125 mg via INTRAVENOUS
  Filled 2018-04-28: qty 2

## 2018-04-28 MED ORDER — IPRATROPIUM BROMIDE 0.02 % IN SOLN
0.5000 mg | Freq: Three times a day (TID) | RESPIRATORY_TRACT | Status: DC
  Administered 2018-04-29 – 2018-05-03 (×13): 0.5 mg via RESPIRATORY_TRACT
  Filled 2018-04-28 (×13): qty 2.5

## 2018-04-28 MED ORDER — SODIUM CHLORIDE 0.9 % IV BOLUS
1000.0000 mL | Freq: Once | INTRAVENOUS | Status: AC
  Administered 2018-04-28: 1000 mL via INTRAVENOUS

## 2018-04-28 MED ORDER — ROSUVASTATIN CALCIUM 10 MG PO TABS
20.0000 mg | ORAL_TABLET | Freq: Every day | ORAL | Status: DC
  Administered 2018-04-28 – 2018-05-06 (×9): 20 mg via ORAL
  Filled 2018-04-28 (×9): qty 2

## 2018-04-28 MED ORDER — LEVALBUTEROL HCL 0.63 MG/3ML IN NEBU
0.6300 mg | INHALATION_SOLUTION | Freq: Three times a day (TID) | RESPIRATORY_TRACT | Status: DC
  Administered 2018-04-29 – 2018-05-03 (×13): 0.63 mg via RESPIRATORY_TRACT
  Filled 2018-04-28 (×13): qty 3

## 2018-04-28 MED ORDER — IPRATROPIUM BROMIDE 0.02 % IN SOLN
0.5000 mg | Freq: Four times a day (QID) | RESPIRATORY_TRACT | Status: DC
  Administered 2018-04-28: 0.5 mg via RESPIRATORY_TRACT
  Filled 2018-04-28: qty 2.5

## 2018-04-28 MED ORDER — LEVALBUTEROL HCL 0.63 MG/3ML IN NEBU
0.6300 mg | INHALATION_SOLUTION | Freq: Four times a day (QID) | RESPIRATORY_TRACT | Status: DC
  Administered 2018-04-28: 0.63 mg via RESPIRATORY_TRACT
  Filled 2018-04-28: qty 3

## 2018-04-28 MED ORDER — BUDESONIDE 0.25 MG/2ML IN SUSP
0.2500 mg | Freq: Two times a day (BID) | RESPIRATORY_TRACT | Status: DC
  Administered 2018-04-28 – 2018-05-06 (×16): 0.25 mg via RESPIRATORY_TRACT
  Filled 2018-04-28 (×16): qty 2

## 2018-04-28 MED ORDER — ENOXAPARIN SODIUM 40 MG/0.4ML ~~LOC~~ SOLN
40.0000 mg | SUBCUTANEOUS | Status: DC
  Administered 2018-04-28 – 2018-05-05 (×8): 40 mg via SUBCUTANEOUS
  Filled 2018-04-28 (×9): qty 0.4

## 2018-04-28 MED ORDER — PREDNISONE 20 MG PO TABS
40.0000 mg | ORAL_TABLET | Freq: Every day | ORAL | Status: DC
  Administered 2018-04-29 – 2018-04-30 (×2): 40 mg via ORAL
  Filled 2018-04-28 (×2): qty 2

## 2018-04-28 MED ORDER — ALBUTEROL SULFATE (2.5 MG/3ML) 0.083% IN NEBU
5.0000 mg | INHALATION_SOLUTION | Freq: Once | RESPIRATORY_TRACT | Status: AC
  Administered 2018-04-28: 5 mg via RESPIRATORY_TRACT
  Filled 2018-04-28: qty 6

## 2018-04-28 MED ORDER — BUPROPION HCL ER (XL) 150 MG PO TB24
150.0000 mg | ORAL_TABLET | Freq: Every day | ORAL | Status: DC
  Administered 2018-04-28 – 2018-05-06 (×9): 150 mg via ORAL
  Filled 2018-04-28 (×9): qty 1

## 2018-04-28 MED ORDER — ALPRAZOLAM 0.25 MG PO TABS
0.2500 mg | ORAL_TABLET | Freq: Two times a day (BID) | ORAL | Status: DC | PRN
  Administered 2018-05-02 – 2018-05-05 (×4): 0.25 mg via ORAL
  Filled 2018-04-28 (×5): qty 1

## 2018-04-28 MED ORDER — IPRATROPIUM BROMIDE 0.02 % IN SOLN
0.5000 mg | Freq: Once | RESPIRATORY_TRACT | Status: AC
  Administered 2018-04-28: 0.5 mg via RESPIRATORY_TRACT
  Filled 2018-04-28: qty 2.5

## 2018-04-28 MED ORDER — SODIUM CHLORIDE 0.9 % IV SOLN: INTRAVENOUS | Status: DC

## 2018-04-28 NOTE — ED Triage Notes (Signed)
EMS reports from home, diarrhea x 1 month and increasing generalized weakness. Seen 2 weeks ago for same, also saw Nephrologist and GI a week ago. No chronic Abx usage.  BP 142/72 HR 100 RR 20 Sp02 93 RA CBG 99

## 2018-04-28 NOTE — Progress Notes (Signed)
Attempted to call ed to get report; no answer at this time.

## 2018-04-28 NOTE — H&P (Signed)
History and Physical    Andrea Larson NIO:[id number] DOB: 04-18-1946 DOA: 04/28/2018  I have briefly reviewed the patient's prior medical records in [place]  PCP: Biagio Borg, MD  Patient coming from: home  Chief Complaint: Progressive diarrhea  HPI: Andrea Larson is a 72 y.o. female with medical history significant of COPD, hyperlipidemia, alcohol abuse (has not been drinking for 3 weeks now) hypertension, chronic kidney disease stage III, who presents to the hospital with chief complaint of unrelenting diarrhea.  She states that she has been having significant diarrhea for the past 6 weeks associated with a total of about 20 pound weight loss.  She was evaluated by GI on 04/11/2018 in office.  At that time she was found to be quite dehydrated in acute renal failure and was admitted to the hospital, hydrated and then discharged home.  She states that her diarrhea never resolved, and since being home is progressively gotten worse.  She gets watery diarrhea every time she eats or drinks anything including water.  She also gets occasional abdominal cramping.  She has been trying to stay hydrated with Pedialyte.  She denies any chest pain, this complain of increased shortness of breath and wheezing for the last couple of days.  She denies any fever or chills, sore throat, chest congestion.  She does have a chronic cough without sputum production and this has not changed in quality.  ED Course: In the ER her vital signs are unremarkable, creatinine is 1.2, LFTs are normal, CBC has a hemoglobin of 10.1.  Due to persistent diarrhea, GI was consulted, and recommended admission for further work-up and we were called  Review of Systems: As per HPI otherwise 10 point review of systems negative.   Past Medical History:  Diagnosis Date  . Allergic rhinitis   . Allergic rhinitis, cause unspecified 09/21/2011  . Aortic stenosis 08/23/2016  . Arthritis   . Asthma   . Asthma 09/21/2011  .  Colon polyps   . COPD (chronic obstructive pulmonary disease) (HCC)    mild   . Coronary artery calcification 08/23/2016  . Hyperlipidemia   . UTI (lower urinary tract infection)     Past Surgical History:  Procedure Laterality Date  . DENTAL SURGERY    . pilonidial cyst    . TONSILLECTOMY       reports that she quit smoking about 3 years ago. Her smoking use included cigarettes. She has a 41.25 pack-year smoking history. She has never used smokeless tobacco. She reports that she drinks alcohol. She reports that she does not use drugs.  Allergies  Allergen Reactions  . Lipitor [Atorvastatin] Other (See Comments)    Myalgias    Family History  Problem Relation Age of Onset  . Alcohol abuse Other   . Arthritis Other   . Heart disease Other   . Stroke Other   . Hypertension Other   . Hypertension Father   . Stroke Father   . Atrial fibrillation Brother   . Colon cancer Neg Hx     Prior to Admission medications   Medication Sig Start Date End Date Taking? Authorizing Provider  ALPRAZolam (XANAX) 0.25 MG tablet TAKE (1) TABLET TWICE A DAY AS NEEDED. Patient taking differently: Take 0.25 mg by mouth 2 (two) times daily as needed for anxiety.  11/29/17  Yes Biagio Borg, MD  buPROPion (WELLBUTRIN XL) 150 MG 24 hr tablet Take 1 tablet (150 mg total) by mouth daily. 04/21/18  Yes  Biagio Borg, MD  diphenoxylate-atropine (LOMOTIL) 2.5-0.025 MG tablet Take 1 tablet by mouth 4 (four) times daily as needed for diarrhea or loose stools. 04/21/18  Yes Biagio Borg, MD  feeding supplement, ENSURE ENLIVE, (ENSURE ENLIVE) LIQD Take 237 mLs by mouth 3 (three) times daily between meals. Patient taking differently: Take 237 mLs by mouth 2 (two) times daily between meals.  04/21/18  Yes Biagio Borg, MD  levalbuterol [facility] HFA) 45 MCG/ACT inhaler Inhale 2 puffs into the lungs every 6 (six) hours as needed for wheezing. 03/18/18  Yes Mannam, Praveen, MD  levalbuterol (XOPENEX) 0.63 MG/3ML  nebulizer solution Take 3 mLs (0.63 mg total) by nebulization every 6 (six) hours as needed for wheezing or shortness of breath. Dx:J44.9 03/18/18 03/18/19 Yes Mannam, Praveen, MD  loperamide (IMODIUM A-D) 2 MG tablet Take 2 mg by mouth 4 (four) times daily as needed for diarrhea or loose stools.   Yes [provider]  potassium chloride (KLOR-CON 10) 10 MEQ tablet Take 2 tablets (20 mEq total) by mouth daily for 5 days. 04/21/18 04/28/18 Yes Biagio Borg, MD  rosuvastatin (CRESTOR) 20 MG tablet TAKE 1 TABLET ONCE DAILY. Patient taking differently: Take 20 mg by mouth daily.  09/10/17  Yes Biagio Borg, MD  SPIRIVA RESPIMAT 2.5 MCG/ACT AERS USE 1 PUFF DAILY AS DIRECTED Patient taking differently: Take 1 puff by mouth daily.  12/26/17  Yes Biagio Borg, MD  SYMBICORT 160-4.5 MCG/ACT inhaler USE 2 PUFFS TWICE DAILY. Patient taking differently: Inhale 2 puffs into the lungs 2 (two) times daily.  10/21/17  Yes Biagio Borg, MD  FLUoxetine (PROZAC) 40 MG capsule TAKE (1) CAPSULE DAILY. Patient not taking: No sig reported 09/05/17   Biagio Borg, MD  Respiratory Therapy Supplies (FLUTTER) DEVI Use as directed 08/19/17   Marshell Garfinkel, MD    Physical Exam: Vitals:   04/28/18 1330 04/28/18 1400 04/28/18 1426 04/28/18 1427  BP: 132/71 131/68 131/68   Pulse: 94 92 92   Resp:   16   Temp:      TempSrc:      SpO2: 93% 94% 94% 96%      Constitutional: NAD, calm, comfortable Eyes: PERRL, lids and conjunctivae normal ENMT: Mucous membranes are moist. P Neck: normal, supple Respiratory: Faint end expiratory wheezing, no crackles. Normal respiratory effort. No accessory muscle use.  Cardiovascular: Regular rate and rhythm, soft SEM.  Also has an abdominal bruit.  No extremity edema. 2+ pedal pulses.  Abdomen: no tenderness, no masses palpated. Bowel sounds positive.  Musculoskeletal: no clubbing / cyanosis. Normal muscle tone.  Skin: no rashes, lesions, ulcers. No induration Neurologic: CN  2-12 grossly intact. Strength 5/5 in all 4.  Psychiatric: Normal judgment and insight. Alert and oriented x 3. Normal mood.   Labs on Admission: I have personally reviewed following labs and imaging studies  CBC: Recent Labs  Lab 04/28/18 1328  WBC 7.5  NEUTROABS 5.3  HGB 10.1*  HCT 32.9*  MCV 100.9*  PLT 627   Basic Metabolic Panel: Recent Labs  Lab 04/28/18 1328  NA 138  K 4.3  CL 107  CO2 23  GLUCOSE 84  BUN 19  CREATININE 1.22*  CALCIUM 8.3*   GFR: Estimated Creatinine Clearance: 31.5 mL/min (A) (by C-G formula based on SCr of 1.22 mg/dL (H)). Liver Function Tests: Recent Labs  Lab 04/28/18 1328  AST 16  ALT 14  ALKPHOS 53  BILITOT 0.7  PROT 5.7*  ALBUMIN  2.3*   Recent Labs  Lab 04/28/18 1328  LIPASE 18   No results for input(s): AMMONIA in the last 168 hours. Coagulation Profile: No results for input(s): INR, PROTIME in the last 168 hours. Cardiac Enzymes: No results for input(s): CKTOTAL, CKMB, CKMBINDEX, TROPONINI in the last 168 hours. BNP (last 3 results) No results for input(s): PROBNP in the last 8760 hours. HbA1C: No results for input(s): HGBA1C in the last 72 hours. CBG: No results for input(s): GLUCAP in the last 168 hours. Lipid Profile: No results for input(s): CHOL, HDL, LDLCALC, TRIG, CHOLHDL, LDLDIRECT in the last 72 hours. Thyroid Function Tests: No results for input(s): TSH, T4TOTAL, FREET4, T3FREE, THYROIDAB in the last 72 hours. Anemia Panel: No results for input(s): VITAMINB12, FOLATE, FERRITIN, TIBC, IRON, RETICCTPCT in the last 72 hours. Urine analysis:    Component Value Date/Time   COLORURINE YELLOW 03/21/2018 1428   APPEARANCEUR [place] (A) 03/21/2018 1428   LABSPEC >=1.030 (A) 03/21/2018 1428   PHURINE 6.0 03/21/2018 1428   GLUCOSEU NEGATIVE 03/21/2018 1428   HGBUR LARGE (A) 03/21/2018 1428   HGBUR negative 06/20/2010 1406   BILIRUBINUR MODERATE (A) 03/21/2018 1428   KETONESUR 15 (A) 03/21/2018 1428    PROTEINUR 30 (A) 06/18/2016 0048   UROBILINOGEN 0.2 03/21/2018 1428   NITRITE NEGATIVE 03/21/2018 1428   LEUKOCYTESUR NEGATIVE 03/21/2018 1428     Radiological Exams on Admission: Dg Chest 2 View  Result Date: 04/28/2018 CLINICAL DATA:  Shortness of breath. EXAM: CHEST - 2 VIEW COMPARISON:  Chest CT dated 08/13/2016 and chest x-ray dated 06/17/2016 FINDINGS: The heart size and pulmonary vascularity are normal. Lungs are clear. No effusions. Aortic atherosclerosis. Old compression fractures in the midthoracic spine. No acute bone abnormality. IMPRESSION: No acute abnormalities. Aortic Atherosclerosis (ICD10-I70.0). Electronically Signed   By: Lorriane Shire M.D.   On: 04/28/2018 13:49    Assessment/Plan Principal Problem:   Diarrhea Active Problems:   Hyperlipidemia   COPD (chronic obstructive pulmonary disease) (HCC)   COPD exacerbation (HCC)   Essential hypertension   CKD (chronic kidney disease), stage III (HCC)   Acute on chronic diarrhea -No recent antibiotics, this does not appear to be infectious, she is afebrile, no nausea or vomiting.  GI consulted, appreciate input -Keep n.p.o. until GI evaluates, continue fluids  COPD with possible mild exacerbation -No productive cough, no role for antibiotics, do a short course of prednisone as well as nebulizers  Hyperlipidemia -Continue Crestor  Tobacco and alcohol abuse, both in remission -Family confirms she has not drank anything in 3 weeks  Possible chronic kidney disease stage III -Creatinine 1.2 on admission, she did have prior normal values as well as a creatinine of 2 in August 2019 and 1.4 in 2017, suspect a degree of chronic kidney disease but currently her renal function is stable  Anxiety -Continue home medications   DVT prophylaxis: Lovenox Code Status: Full code Family Communication: Family at bedside Disposition Plan: Admit to [facility] called: GI    Admission status: Observation  At the point  of initial evaluation, it is my clinical opinion that admission for OBSERVATION is reasonable and necessary because the patient's presenting complaints in the context of their chronic conditions represent sufficient risk of deterioration or significant morbidity to constitute reasonable grounds for close observation in the hospital setting, but that the patient may be medically stable for discharge from the hospital within 24 to 48 hours.    Marzetta Board, MD [facility] Pager [phone]  If 7PM-7AM, please contact night-coverage [email or web address] Password [email or web address]  04/28/2018, 3:11 PM

## 2018-04-28 NOTE — Progress Notes (Signed)
Attempted to get report; RN is with another patient at this time and will call back to give report.

## 2018-04-28 NOTE — Progress Notes (Signed)
Patient ID: Andrea Larson, female   DOB: January 07, 1946, 72 y.o.   MRN: [id number]    Brief GI Note - consult received   Have ordered multiple stool studies - needs infectious workup completed , then if negative  may need Colonoscopy this admit. Full GI consult  In am

## 2018-04-28 NOTE — ED Notes (Signed)
Bed: HV74 Expected date: 04/28/18 Expected time: 11:54 AM Means of arrival: Ambulance Comments: EMS weakness, poss fever, SHOB

## 2018-04-28 NOTE — ED Provider Notes (Signed)
[facility] DEPT Provider Note   CSN: [id number] Arrival date & time: 04/28/18  1155     History   Chief Complaint Chief Complaint  Patient presents with  . Diarrhea  . Weakness    HPI Andrea Larson is a 72 y.o. female.  72 year old female with history of COPD and chronic diarrhea x7 weeks who is being followed by GI for that presents with worsening loose stools now with associated weakness and increased dyspnea.  States that she has been using her home nebulizer with some relief and feels that her dyspnea is related to her COPD.  Denies any fever or cough.  No anginal type chest pain.  Watery diarrhea has been persistent and not associated with blood in her stool or abdominal bloating.  She is being followed by GI and has an appointment coming up.  Also recently had an admission for acute renal failure.  Notes no change to color of her urine.     Past Medical History:  Diagnosis Date  . Allergic rhinitis   . Allergic rhinitis, cause unspecified 09/21/2011  . Aortic stenosis 08/23/2016  . Arthritis   . Asthma   . Asthma 09/21/2011  . Colon polyps   . COPD (chronic obstructive pulmonary disease) (HCC)    mild   . Coronary artery calcification 08/23/2016  . Hyperlipidemia   . UTI (lower urinary tract infection)     Patient Active Problem List   Diagnosis Date Noted  . Diarrhea due to drug 04/21/2018  . Malnutrition of moderate degree 04/13/2018  . ARF (acute renal failure) ([place]) 04/11/2018  . Right wrist pain 04/11/2018  . Essential hypertension 04/11/2018  . Gallstones 04/11/2018  . Hypokalemia 03/21/2018  . UTI (urinary tract infection) 02/19/2018  . AKI (acute kidney injury) ([place]) 02/19/2018  . Coronary artery calcification 08/23/2016  . Aortic stenosis 08/23/2016  . Chest pain 07/20/2016  . Hyperglycemia 07/20/2016  . Insomnia 08/18/2015  . GERD (gastroesophageal reflux disease) 08/18/2015  . Acute bronchitis 02/15/2015  .  COPD exacerbation ([place]) 02/15/2015  . Tachycardia 10/28/2012  . Back pain 10/28/2012  . Asthma 09/21/2011  . Allergic rhinitis 09/21/2011  . Colon polyps 09/21/2011  . Fatigue 09/21/2011  . Encounter for well adult exam with abnormal findings 09/15/2011  . SYNCOPE 06/20/2010  . DEGENERATIVE JOINT DISEASE, CERVICAL SPINE 10/21/2008  . Overweight(278.02) 02/10/2008  . ANXIETY DEPRESSION 02/10/2008  . COPD (chronic obstructive pulmonary disease) ([place]) 02/10/2008  . EMPHYSEMA NEC 01/30/2007  . Hyperlipidemia 12/31/2006    Past Surgical History:  Procedure Laterality Date  . DENTAL SURGERY    . pilonidial cyst    . TONSILLECTOMY       OB History   None      Home Medications    Prior to Admission medications   Medication Sig Start Date End Date Taking? Authorizing Provider  ALPRAZolam (XANAX) 0.25 MG tablet TAKE (1) TABLET TWICE A DAY AS NEEDED. Patient taking differently: Take 0.25 mg by mouth 2 (two) times daily as needed for anxiety.  11/29/17   Biagio Borg, MD  buPROPion (WELLBUTRIN XL) 150 MG 24 hr tablet Take 1 tablet (150 mg total) by mouth daily. 04/21/18   Biagio Borg, MD  diphenoxylate-atropine (LOMOTIL) 2.5-0.025 MG tablet Take 1 tablet by mouth 4 (four) times daily as needed for diarrhea or loose stools. 04/21/18   Biagio Borg, MD  feeding supplement, ENSURE ENLIVE, (ENSURE ENLIVE) LIQD Take 237 mLs by mouth 3 (three) times daily  between meals. 04/21/18   Biagio Borg, MD  FLUoxetine (PROZAC) 40 MG capsule TAKE (1) CAPSULE DAILY. Patient taking differently: Take 40 mg by mouth daily.  09/05/17   Biagio Borg, MD  levalbuterol [facility] HFA) 45 MCG/ACT inhaler Inhale 2 puffs into the lungs every 6 (six) hours as needed for wheezing. 03/18/18   Mannam, Hart Robinsons, MD  levalbuterol (XOPENEX) 0.63 MG/3ML nebulizer solution Take 3 mLs (0.63 mg total) by nebulization every 6 (six) hours as needed for wheezing or shortness of breath. Dx:J44.9 03/18/18 03/18/19  Mannam, Hart Robinsons,  MD  potassium chloride (KLOR-CON 10) 10 MEQ tablet Take 2 tablets (20 mEq total) by mouth daily for 5 days. 04/21/18 04/26/18  Biagio Borg, MD  Respiratory Therapy Supplies (FLUTTER) DEVI Use as directed 08/19/17   Marshell Garfinkel, MD  rosuvastatin (CRESTOR) 20 MG tablet TAKE 1 TABLET ONCE DAILY. 09/10/17   Biagio Borg, MD  SPIRIVA RESPIMAT 2.5 MCG/ACT AERS USE 1 PUFF DAILY AS DIRECTED Patient taking differently: Take 1 puff by mouth daily.  12/26/17   Biagio Borg, MD  SYMBICORT 160-4.5 MCG/ACT inhaler USE 2 PUFFS TWICE DAILY. Patient taking differently: Inhale 2 puffs into the lungs 2 (two) times daily.  10/21/17   Biagio Borg, MD    Family History Family History  Problem Relation Age of Onset  . Alcohol abuse Other   . Arthritis Other   . Heart disease Other   . Stroke Other   . Hypertension Other   . Hypertension Father   . Stroke Father   . Atrial fibrillation Brother   . Colon cancer Neg Hx     Social History Social History   Tobacco Use  . Smoking status: Former Smoker    Packs/day: 0.75    Years: 55.00    Pack years: 41.25    Types: Cigarettes    Last attempt to quit: 04/18/2015    Years since quitting: 3.0  . Smokeless tobacco: Never Used  Substance Use Topics  . Alcohol use: Yes    Alcohol/week: 0.0 standard drinks    Comment: occ.   . Drug use: No     Allergies   Lipitor [atorvastatin]   Review of Systems Review of Systems  All other systems reviewed and are negative.    Physical Exam Updated Vital Signs BP 134/68   Pulse 94   Temp 98 F (36.7 C) (Oral)   Resp 16   SpO2 95%   Physical Exam  Constitutional: She is oriented to person, place, and time. She appears well-developed and well-nourished.  Non-toxic appearance. No distress.  HENT:  Head: Normocephalic and atraumatic.  Eyes: Pupils are equal, round, and reactive to light. Conjunctivae, EOM and lids are normal.  Neck: Normal range of motion. Neck supple. No tracheal deviation  present. No thyroid mass present.  Cardiovascular: Normal rate, regular rhythm and normal heart sounds. Exam reveals no gallop.  No murmur heard. Pulmonary/Chest: Effort normal and breath sounds normal. No stridor. No respiratory distress. She has no decreased breath sounds. She has no wheezes. She has no rhonchi. She has no rales.  Abdominal: Soft. Normal appearance and bowel sounds are normal. She exhibits no distension. There is no tenderness. There is no rebound and no CVA tenderness.  Musculoskeletal: Normal range of motion. She exhibits no edema or tenderness.  Neurological: She is alert and oriented to person, place, and time. She has normal strength. No cranial nerve deficit or sensory deficit. GCS eye subscore is 4.  GCS verbal subscore is 5. GCS motor subscore is 6.  Skin: Skin is warm and dry. No abrasion and no rash noted.  Psychiatric: She has a normal mood and affect. Her speech is normal and behavior is normal.  Nursing note and vitals reviewed.    ED Treatments / Results  Labs (all labs ordered are listed, but only abnormal results are displayed) Labs Reviewed  CBC WITH DIFFERENTIAL/PLATELET  COMPREHENSIVE METABOLIC PANEL  LIPASE, BLOOD    EKG None  Radiology No results found.  Procedures Procedures (including critical care time)  Medications Ordered in ED Medications  0.9 %  sodium chloride infusion (has no administration in time range)  sodium chloride 0.9 % bolus 1,000 mL (has no administration in time range)  albuterol (PROVENTIL) (2.5 MG/3ML) 0.083% nebulizer solution 5 mg (has no administration in time range)  ipratropium (ATROVENT) nebulizer solution 0.5 mg (has no administration in time range)     Initial Impression / Assessment and Plan / ED Course  I have reviewed the triage vital signs and the nursing notes.  Pertinent labs & imaging results that were available during my care of the patient were reviewed by me and considered in my medical decision  making (see chart for details).     Patient given IV fluids here.  Discussed with GI who will come and see patient as well.  COPD treated with steroids and beta agonist therapy.  Due to patient's worsening weakness will consult hospitalist for admission  Final Clinical Impressions(s) / ED Diagnoses   Final diagnoses:  SOB (shortness of breath)    ED Discharge Orders    None       Lacretia Leigh, MD 04/28/18 1433

## 2018-04-28 NOTE — ED Notes (Signed)
ED TO INPATIENT HANDOFF REPORT  Name/Age/Gender Andrea Larson 72 y.o. female  Code Status Code Status History    Date Active Date Inactive Code Status Order ID Comments User Context   04/11/2018 2232 04/14/2018 1553 Full Code [id number]  Rise Patience, MD Inpatient      Home/SNF/Other Home  Chief Complaint WEAKNESS  Level of Care/Admitting Diagnosis ED Disposition    ED Disposition Condition [place] [facility] Area: [facility] [[id number]]  Level of Care: Med-Surg [16]  Diagnosis: Diarrhea [787.91.ICD-9-CM]  Admitting Physician: Caren Griffins [[id number]]  Attending Physician: Caren Griffins [id number]  PT Class (Do Not Modify): Observation [104]  PT Acc Code (Do Not Modify): Observation [10022]       Medical History Past Medical History:  Diagnosis Date  . Allergic rhinitis   . Allergic rhinitis, cause unspecified 09/21/2011  . Aortic stenosis 08/23/2016  . Arthritis   . Asthma   . Asthma 09/21/2011  . Colon polyps   . COPD (chronic obstructive pulmonary disease) (HCC)    mild   . Coronary artery calcification 08/23/2016  . Hyperlipidemia   . UTI (lower urinary tract infection)     Allergies Allergies  Allergen Reactions  . Lipitor [Atorvastatin] Other (See Comments)    Myalgias    IV Location/Drains/Wounds Patient Lines/Drains/Airways Status   Active Line/Drains/Airways    Name:   Placement date:   Placement time:   Site:   Days:   Peripheral IV 04/28/18 Left Antecubital   04/28/18    1326    Antecubital   less than 1          Labs/Imaging Results for orders placed or performed during the hospital encounter of 04/28/18 (from the past 48 hour(s))  CBC with Differential/Platelet     Status: Abnormal   Collection Time: 04/28/18  1:28 PM  Result Value Ref Range   WBC 7.5 4.0 - 10.5 K/uL   RBC 3.26 (L) 3.87 - 5.11 MIL/uL   Hemoglobin 10.1 (L) 12.0 - 15.0 g/dL   HCT 32.9 (L) 36.0 - 46.0 %   MCV 100.9 (H) 80.0 - 100.0 fL    MCH 31.0 26.0 - 34.0 pg   MCHC 30.7 30.0 - 36.0 g/dL   RDW 13.7 11.5 - 15.5 %   Platelets 345 150 - 400 K/uL   nRBC 0.0 0.0 - 0.2 %   Neutrophils Relative % 70 %   Neutro Abs 5.3 1.7 - 7.7 K/uL   Lymphocytes Relative 13 %   Lymphs Abs 1.0 0.7 - 4.0 K/uL   Monocytes Relative 13 %   Monocytes Absolute 1.0 0.1 - 1.0 K/uL   Eosinophils Relative 2 %   Eosinophils Absolute 0.2 0.0 - 0.5 K/uL   Basophils Relative 1 %   Basophils Absolute 0.1 0.0 - 0.1 K/uL   Smear Review MORPHOLOGY UNREMARKABLE    Immature Granulocytes 1 %   Abs Immature Granulocytes 0.06 0.00 - 0.07 K/uL    Comment: Performed at [facility], [place] [address] [place], [place] [address]  Comprehensive metabolic panel     Status: Abnormal   Collection Time: 04/28/18  1:28 PM  Result Value Ref Range   Sodium 138 135 - 145 mmol/L   Potassium 4.3 3.5 - 5.1 mmol/L   Chloride 107 98 - 111 mmol/L   CO2 23 22 - 32 mmol/L   Glucose, Bld 84 70 - 99 mg/dL   BUN 19 8 - 23 mg/dL  Creatinine, Ser 1.22 (H) 0.44 - 1.00 mg/dL   Calcium 8.3 (L) 8.9 - 10.3 mg/dL   Total Protein 5.7 (L) 6.5 - 8.1 g/dL   Albumin 2.3 (L) 3.5 - 5.0 g/dL   AST 16 15 - 41 U/L   ALT 14 0 - 44 U/L   Alkaline Phosphatase 53 38 - 126 U/L   Total Bilirubin 0.7 0.3 - 1.2 mg/dL   GFR calc non Af Amer 43 (L) >60 mL/min   GFR calc Af Amer 49 (L) >60 mL/min    Comment: (NOTE) The eGFR has been calculated using the CKD EPI equation. This calculation has not been validated in all clinical situations. eGFR's persistently <60 mL/min signify possible Chronic Kidney Disease.    Anion gap 8 5 - 15    Comment: Performed at [facility], [place] [address] [place], [place] [address]  Lipase, blood     Status: None   Collection Time: 04/28/18  1:28 PM  Result Value Ref Range   Lipase 18 11 - 51 U/L    Comment: Performed at [facility], [place] [address] [place], [place] [address]   Dg Chest 2 View  Result  Date: 04/28/2018 CLINICAL DATA:  Shortness of breath. EXAM: CHEST - 2 VIEW COMPARISON:  Chest CT dated 08/13/2016 and chest x-ray dated 06/17/2016 FINDINGS: The heart size and pulmonary vascularity are normal. Lungs are clear. No effusions. Aortic atherosclerosis. Old compression fractures in the midthoracic spine. No acute bone abnormality. IMPRESSION: No acute abnormalities. Aortic Atherosclerosis (ICD10-I70.0). Electronically Signed   By: Lorriane Shire M.D.   On: 04/28/2018 13:49    Pending Labs [employer] (From admission, onward)    Start     Ordered   Signed and Held  Comprehensive metabolic panel  Tomorrow morning,   R     Signed and Held   Signed and Held  CBC  Tomorrow morning,   R     Signed and Held          Vitals/Pain Today's Vitals   04/28/18 1427 04/28/18 1430 04/28/18 1500 04/28/18 1520  BP:  123/68 138/82 138/82  Pulse:  89 97 97  Resp:    16  Temp:      TempSrc:      SpO2: 96% 100% 93% 93%  PainSc:        Isolation Precautions No active isolations  Medications Medications  0.9 %  sodium chloride infusion (has no administration in time range)  sodium chloride 0.9 % bolus 1,000 mL (0 mLs Intravenous Stopped 04/28/18 1510)  albuterol (PROVENTIL) (2.5 MG/3ML) 0.083% nebulizer solution 5 mg (5 mg Nebulization Given 04/28/18 1426)  ipratropium (ATROVENT) nebulizer solution 0.5 mg (0.5 mg Nebulization Given 04/28/18 1426)  methylPREDNISolone sodium succinate (SOLU-MEDROL) 125 mg/2 mL injection 125 mg (125 mg Intravenous Given 04/28/18 1514)    Mobility walks

## 2018-04-29 LAB — COMPREHENSIVE METABOLIC PANEL
ALT: 13 U/L (ref 0–44)
AST: 12 U/L — ABNORMAL LOW (ref 15–41)
Albumin: 2.1 g/dL — ABNORMAL LOW (ref 3.5–5.0)
Alkaline Phosphatase: 47 U/L (ref 38–126)
Anion gap: 7 (ref 5–15)
BUN: 21 mg/dL (ref 8–23)
CO2: 19 mmol/L — ABNORMAL LOW (ref 22–32)
Calcium: 7.7 mg/dL — ABNORMAL LOW (ref 8.9–10.3)
Chloride: 110 mmol/L (ref 98–111)
Creatinine, Ser: 1.18 mg/dL — ABNORMAL HIGH (ref 0.44–1.00)
GFR calc Af Amer: 51 mL/min — ABNORMAL LOW (ref >60)
GFR calc non Af Amer: 44 mL/min — ABNORMAL LOW (ref >60)
Glucose, Bld: 120 mg/dL — ABNORMAL HIGH (ref 70–99)
Potassium: 4.8 mmol/L (ref 3.5–5.1)
Sodium: 136 mmol/L (ref 135–145)
Total Bilirubin: 0.5 mg/dL (ref 0.3–1.2)
Total Protein: 5.4 g/dL — ABNORMAL LOW (ref 6.5–8.1)

## 2018-04-29 LAB — GASTROINTESTINAL PANEL BY PCR, STOOL (REPLACES STOOL CULTURE)

## 2018-04-29 LAB — IRON AND TIBC
Iron: 40 ug/dL (ref 28–170)
Saturation Ratios: 27 % (ref 10.4–31.8)
TIBC: 149 ug/dL — ABNORMAL LOW (ref 250–450)
UIBC: 109 ug/dL

## 2018-04-29 LAB — FERRITIN: Ferritin: 476 ng/mL — ABNORMAL HIGH (ref 11–307)

## 2018-04-29 LAB — CBC
HCT: 32.3 % — ABNORMAL LOW (ref 36.0–46.0)
Hemoglobin: 9.6 g/dL — ABNORMAL LOW (ref 12.0–15.0)
MCH: 30.4 pg (ref 26.0–34.0)
MCHC: 29.7 g/dL — ABNORMAL LOW (ref 30.0–36.0)
MCV: 102.2 fL — ABNORMAL HIGH (ref 80.0–100.0)
Platelets: 328 10*3/uL (ref 150–400)
RBC: 3.16 MIL/uL — ABNORMAL LOW (ref 3.87–5.11)
RDW: 13.7 % (ref 11.5–15.5)
WBC: 5.3 10*3/uL (ref 4.0–10.5)
nRBC: 0 % (ref 0.0–0.2)

## 2018-04-29 LAB — FOLATE: Folate: 8.1 ng/mL (ref >5.9)

## 2018-04-29 LAB — VITAMIN B12: Vitamin B-12: 1502 pg/mL — ABNORMAL HIGH (ref 180–914)

## 2018-04-29 NOTE — Progress Notes (Signed)
Initial Nutrition Assessment  DOCUMENTATION CODES:   Non-severe (moderate) malnutrition in context of chronic illness  INTERVENTION:   -Diet advancement per MD -Once diet advanced, Provide Ensure Enlive po BID, each supplement provides 350 kcal and 20 grams of protein  NUTRITION DIAGNOSIS:   Moderate Malnutrition related to acute illness, poor appetite, diarrhea as evidenced by percent weight loss, energy intake < or equal to 75% for > or equal to 1 month.  GOAL:   Patient will meet greater than or equal to 90% of their needs  MONITOR:   PO intake, Supplement acceptance, Labs, Weight trends, I & O's  REASON FOR ASSESSMENT:   Malnutrition Screening Tool    ASSESSMENT:   72 y.o. female with medical history significant of COPD, hyperlipidemia, alcohol abuse (has not been drinking for 3 weeks now) hypertension, chronic kidney disease stage III, who presents to the hospital with chief complaint of unrelenting diarrhea.  She states that she has been having significant diarrhea for the past 6 weeks associated with a total of about 20 pound weight loss.  Patient recently seen for similar symptoms on 10/6, pt was started on Ensure supplements which she had continued at home. Pt continues with diarrhea and poor appetite. Prior to that admission pt was consuming vodka daily and has poor intake for 2-3 weeks.   Per chart review, no reported ETOH use since that admission. GI has seen patient and testing for Cdiff. Pt has been on antibiotics around the time the diarrhea started.  Will order Ensure supplements once diet is advanced.  Patient has continued to lose weight, now has lost 15 lb since 9/13 (11% wt loss x 1 month, significant for time frame).  Medications reviewed. Labs reviewed: GFR: 44   NUTRITION - FOCUSED PHYSICAL EXAM:  Nutrition focused physical exam shows no sign of depletion of muscle mass or body fat.  Diet Order:   Diet Order            Diet NPO time specified  Except for: Sips with Meds  Diet effective now              EDUCATION NEEDS:   No education needs have been identified at this time  Skin:  Skin Assessment: Reviewed RN Assessment  Last BM:  10/22   Height:   Ht Readings from Last 1 Encounters:  04/21/18 5' (1.524 m)    Weight:   Wt Readings from Last 1 Encounters:  04/21/18 54.9 kg    Ideal Body Weight:  45.5 kg  BMI:  23.6 kg/m^2  Estimated Nutritional Needs:   Kcal:  1400-1600  Protein:  65-75g  Fluid:  1.8L/day  Clayton Bibles, MS, RD, LDN [facility] Pager: [phone] After Hours Pager: [phone]

## 2018-04-29 NOTE — Consult Note (Addendum)
Referring Provider: [facility]  Primary Care Physician:  Biagio Borg, MD Primary Gastroenterologist:  Lucio Edward, MD  Reason for Consultation:   diarrhea    ASSESSMENT AND PLAN:    #92. 72 year old female admitted with acute diarrhea, started during recent hospital admission.  She did receive antibiotics in the hospital and upon discharge.  Oddly enough, her diarrhea has ceased -With recent hospitalization and antibiotic he certainly need to rule out C. Difficile.  Stool studies pending (GI path panel, C-diff, lactoferrin,  fecal elastase) -If stool studies are negative and diarrhea recurs then consider colonoscopy with random biopsies  #2, Macrocytic anemia.  Baseline hemoglobin around 12.4.  She presented yesterday with hemoglobin of 10.1, down to 9.6 today. -drop in hgb today is probably dilutional but overall her hemoglobin has declined 1 to 2 g.  No overt bleeding.  -Bone marrow depression from alcohol is a possibility.  -Nutritional deficiencies possibly contributing -We will check folate and B12.  Also going to check iron studies as anemia could still be mixed  #3.  Daily alcohol use, none in recent weeks apparently.  No stigmata of chronic liver disease on exam.   HPI: Andrea Larson is a 72 y.o. female with COPD, aortic stenosis, GERD, adenomatous colon polyps , anxiety/depression.  She was seen on the 4th of this month in our office for weakness , diminished appetite and 14 pound weight loss in the preceding 3 weeks.  A family member reported daily vodka use but none in the proceeding couple of weeks. Polyps surveillance colonoscopy in Oct 2016.  She had been taking Advil on a daily basis.  No abdominal pain, nausea vomiting or other localizing symptoms.  She was tachycardic . Stat labs were remarkable for elevated sed rate and CRP.  She had AKI.  Hospitalist was called, patient admitted for further evaluation and treatment.  On admission her white count was 14.6 .  She spent 3 days in the hospital for treatment of AKI of unknown etiology but probably related to NSAIDs and volume depletion.  Cultures were negative.  I do not see where analysis nor urine culture were obtained.  No chest x-ray was done.  She did receive Rocephin possible cellulitis, transitioned to cefdinir on discharge    Most recent colonoscopy - Oct 2016 ENDOSCOPIC IMPRESSION: 1. Two sessile polyps at the cecum; polypectomies performed with cold forceps 2. Sessile polyp in the transverse colon; polypectomy performed with a cold snare 3. Moderate diverticulosis in the sigmoid colon  Path - TAs without HGD  Patient says her diarrhea started during the recent hospital admission.  Upon discharge she was having multiple nonbloody bowel movements a day.  He was taking Imodium as well as Lomotil but nothing worked minimal associated cramping has had some associated nausea and vomiting.  No significant nocturnal stooling.  No contact with anyone with similar symptoms . Other than antibiotics she had not started any new medications.  Her newest medication was an antidepressant but that was started only a week ago.  Patient says she is not prone to diarrhea this is a new problem for her.  His admission her white count is normal, hemoglobin down on admission to 10.1.  It was 11.82 weeks ago.  Hemoglobin down further today at 9.6.  No overt bleeding.   Past Medical History:  Diagnosis Date  . Allergic rhinitis   . Allergic rhinitis, cause unspecified 09/21/2011  . Aortic stenosis 08/23/2016  . Arthritis   . Asthma   .  Asthma 09/21/2011  . Colon polyps   . COPD (chronic obstructive pulmonary disease) (HCC)    mild   . Coronary artery calcification 08/23/2016  . Hyperlipidemia   . UTI (lower urinary tract infection)     Past Surgical History:  Procedure Laterality Date  . DENTAL SURGERY    . pilonidial cyst    . TONSILLECTOMY      Prior to Admission medications   Medication Sig Start Date  End Date Taking? Authorizing Provider  ALPRAZolam (XANAX) 0.25 MG tablet TAKE (1) TABLET TWICE A DAY AS NEEDED. Patient taking differently: Take 0.25 mg by mouth 2 (two) times daily as needed for anxiety.  11/29/17  Yes Biagio Borg, MD  buPROPion (WELLBUTRIN XL) 150 MG 24 hr tablet Take 1 tablet (150 mg total) by mouth daily. 04/21/18  Yes Biagio Borg, MD  diphenoxylate-atropine (LOMOTIL) 2.5-0.025 MG tablet Take 1 tablet by mouth 4 (four) times daily as needed for diarrhea or loose stools. 04/21/18  Yes Biagio Borg, MD  feeding supplement, ENSURE ENLIVE, (ENSURE ENLIVE) LIQD Take 237 mLs by mouth 3 (three) times daily between meals. Patient taking differently: Take 237 mLs by mouth 2 (two) times daily between meals.  04/21/18  Yes Biagio Borg, MD  levalbuterol [facility] HFA) 45 MCG/ACT inhaler Inhale 2 puffs into the lungs every 6 (six) hours as needed for wheezing. 03/18/18  Yes Mannam, Praveen, MD  levalbuterol (XOPENEX) 0.63 MG/3ML nebulizer solution Take 3 mLs (0.63 mg total) by nebulization every 6 (six) hours as needed for wheezing or shortness of breath. Dx:J44.9 03/18/18 03/18/19 Yes Mannam, Praveen, MD  loperamide (IMODIUM A-D) 2 MG tablet Take 2 mg by mouth 4 (four) times daily as needed for diarrhea or loose stools.   Yes [provider]  potassium chloride (KLOR-CON 10) 10 MEQ tablet Take 2 tablets (20 mEq total) by mouth daily for 5 days. 04/21/18 04/28/18 Yes Biagio Borg, MD  rosuvastatin (CRESTOR) 20 MG tablet TAKE 1 TABLET ONCE DAILY. Patient taking differently: Take 20 mg by mouth daily.  09/10/17  Yes Biagio Borg, MD  SPIRIVA RESPIMAT 2.5 MCG/ACT AERS USE 1 PUFF DAILY AS DIRECTED Patient taking differently: Take 1 puff by mouth daily.  12/26/17  Yes Biagio Borg, MD  SYMBICORT 160-4.5 MCG/ACT inhaler USE 2 PUFFS TWICE DAILY. Patient taking differently: Inhale 2 puffs into the lungs 2 (two) times daily.  10/21/17  Yes Biagio Borg, MD  FLUoxetine (PROZAC) 40 MG capsule  TAKE (1) CAPSULE DAILY. Patient not taking: No sig reported 09/05/17   Biagio Borg, MD  Respiratory Therapy Supplies (FLUTTER) DEVI Use as directed 08/19/17   Marshell Garfinkel, MD    Current Facility-Administered Medications  Medication Dose Route Frequency Provider Last Rate Last Dose  . 0.9 %  sodium chloride infusion   Intravenous Continuous Caren Griffins, MD 75 mL/hr at 04/29/18 0743    . ALPRAZolam (XANAX) tablet 0.25 mg  0.25 mg Oral BID PRN Caren Griffins, MD      . budesonide (PULMICORT) nebulizer solution 0.25 mg  0.25 mg Nebulization BID Caren Griffins, MD   0.25 mg at 04/29/18 0725  . buPROPion (WELLBUTRIN XL) 24 hr tablet 150 mg  150 mg Oral Daily Caren Griffins, MD   150 mg at 04/29/18 0830  . enoxaparin (LOVENOX) injection 40 mg  40 mg Subcutaneous Q24H Caren Griffins, MD   40 mg at 04/28/18 2121  . ipratropium (ATROVENT) nebulizer solution  0.5 mg  0.5 mg Nebulization TID Caren Griffins, MD   0.5 mg at 04/29/18 0725  . levalbuterol (XOPENEX) nebulizer solution 0.63 mg  0.63 mg Nebulization TID Caren Griffins, MD   0.63 mg at 04/29/18 0725  . predniSONE (DELTASONE) tablet 40 mg  40 mg Oral Q breakfast Caren Griffins, MD   40 mg at 04/29/18 0830  . rosuvastatin (CRESTOR) tablet 20 mg  20 mg Oral Daily Caren Griffins, MD   20 mg at 04/29/18 0830    Allergies as of 04/28/2018 - Review Complete 04/28/2018  Allergen Reaction Noted  . Lipitor [atorvastatin] Other (See Comments) 10/28/2012    Family History  Problem Relation Age of Onset  . Alcohol abuse Other   . Arthritis Other   . Heart disease Other   . Stroke Other   . Hypertension Other   . Hypertension Father   . Stroke Father   . Atrial fibrillation Brother   . Colon cancer Neg Hx     Social History   Socioeconomic History  . Marital status: Divorced    Spouse name: Not on file  . Number of children: Not on file  . Years of education: Not on file  . Highest education level: Not on  file  Occupational History  . Not on file  Social Needs  . Financial resource strain: Not on file  . Food insecurity:    Worry: Not on file    Inability: Not on file  . Transportation needs:    Medical: Not on file    Non-medical: Not on file  Tobacco Use  . Smoking status: Former Smoker    Packs/day: 0.75    Years: 55.00    Pack years: 41.25    Types: Cigarettes    Last attempt to quit: 04/18/2015    Years since quitting: 3.0  . Smokeless tobacco: Never Used  Substance and Sexual Activity  . Alcohol use: Yes    Alcohol/week: 0.0 standard drinks    Comment: occ.   . Drug use: No  . Sexual activity: Not on file  Lifestyle  . Physical activity:    Days per week: Not on file    Minutes per session: Not on file  . Stress: Not on file  Relationships  . Social connections:    Talks on phone: Not on file    Gets together: Not on file    Attends religious service: Not on file    Active member of club or organization: Not on file    Attends meetings of clubs or organizations: Not on file    Relationship status: Not on file  . Intimate partner violence:    Fear of current or ex partner: Not on file    Emotionally abused: Not on file    Physically abused: Not on file    Forced sexual activity: Not on file  Other Topics Concern  . Not on file  Social History Narrative  . Not on file    Review of Systems: All systems reviewed and negative except where noted in HPI.  Physical Exam: Vital signs in last 24 hours: Temp:  [97.5 F (36.4 C)-98.2 F (36.8 C)] 97.5 F (36.4 C) (10/22 0345) Pulse Rate:  [89-109] 101 (10/22 0726) Resp:  [16-22] 17 (10/22 0726) BP: (123-142)/(68-82) 140/76 (10/22 0345) SpO2:  [93 %-100 %] 95 % (10/22 0726) Last BM Date: 04/28/18 General:   Alert, well-developed, white female in NAD Psych:  Pleasant, cooperative. Normal  mood and affect. Eyes:  Pupils equal, sclera clear, no icterus.   Conjunctiva pink. Ears:  Normal auditory acuity. Nose:   No deformity, discharge,  or lesions. Neck:  Supple; no masses Lungs:  A few faint bilateral wheezes.  No crackles.  Heart:  Distant sounds. Regular rate and rhythm; no murmurs heard Abdomen:  Soft, non-distended, nontender, BS active, no palp mass    Rectal:  Deferred  Msk:  Symmetrical without gross deformities. . Neurologic:  Alert and  oriented x4;  grossly normal neurologically. Skin:  Intact without significant lesions or rashes..   Intake/Output from previous day: 10/21 0701 - 10/22 0700 In: 1950.5 [I.V.:950.5; IV Piggyback:1000] Out: 0  Intake/Output this shift: No intake/output data recorded.  Lab Results: Recent Labs    04/28/18 1328 04/29/18 0555  WBC 7.5 5.3  HGB 10.1* 9.6*  HCT 32.9* 32.3*  PLT 345 328   BMET Recent Labs    04/28/18 1328 04/29/18 0555  NA 138 136  K 4.3 4.8  CL 107 110  CO2 23 19*  GLUCOSE 84 120*  BUN 19 21  CREATININE 1.22* 1.18*  CALCIUM 8.3* 7.7*   LFT Recent Labs    04/29/18 0555  PROT 5.4*  ALBUMIN 2.1*  AST 12*  ALT 13  ALKPHOS 47  BILITOT 0.5     Studies/Results: Dg Chest 2 View  Result Date: 04/28/2018 CLINICAL DATA:  Shortness of breath. EXAM: CHEST - 2 VIEW COMPARISON:  Chest CT dated 08/13/2016 and chest x-ray dated 06/17/2016 FINDINGS: The heart size and pulmonary vascularity are normal. Lungs are clear. No effusions. Aortic atherosclerosis. Old compression fractures in the midthoracic spine. No acute bone abnormality. IMPRESSION: No acute abnormalities. Aortic Atherosclerosis (ICD10-I70.0). Electronically Signed   By: Lorriane Shire M.D.   On: 04/28/2018 13:49     Tye Savoy, NP-C @  04/29/2018, 8:55 AM

## 2018-04-29 NOTE — Progress Notes (Signed)
PROGRESS NOTE [facility]   CAERA ENWRIGHT   UGQ:[id number] DOB: 10-15-1945  DOA: 04/28/2018 PCP: Biagio Borg, MD   Brief Narrative:  [name] 72 year old female with medical history significant of COPD, hyperlipidemia, alcohol abuse, hypertension, CKD who presented to the emergency department complaining of persistent diarrhea.  Patient was recently discharged from the hospital on antibiotics and since then patient has persistent diarrhea, associated with weight loss and nausea w vomiting.Marland Kitchen  Upon ED evaluation vital signs were stable, creatinine of 1.2.  Patient admitted with working diagnosis of persistent diarrhea.  Subjective: She is seen and examined, has had 2 diarrheal episodes this morning.  GI pathogen negative.  Denies nausea and vomiting.   Assessment & Plan: Diarrhea Somewhat improved today however continues to have episodic bowel movement.  With recent hospitalization in antibiotic use need to rule out C. difficile.  Stool studies GI pathogen negative.  C. difficile, lactoferrin and fecal elastase pending.  GI was consulted and recommending possible colonoscopy if stool studies negative.  Diet has been advanced to clear liquids.  If tolerated and no further work-up patient can be discharged in a.m.  COPD On admission felt to be with mild exacerbation, patient was started on short course of prednisone with nebulizer treatment.  Denies shortness of breath and cough.  Will continue current treatment for now.    Microcytic anemia Likely from alcohol abuse, bone marrow suppression.  No signs of overt bleeding.  Check anemia panel.  Continue to monitor and transfuse if hemoglobin less than 7.  CKD stage III Creatinine seems to be at baseline.  Monitor renal function  Tobacco and alcohol abuse In remission  Anxiety Continue home medication  DVT prophylaxis: Lovenox  Code Status: Full Code  Family Communication: Family at beside  Disposition Plan:  pending GI work up, possible in AM   Consultants:   GI   Procedures:   None   Antimicrobials:  None     Objective: Vitals:   04/29/18 0345 04/29/18 0726 04/29/18 1312 04/29/18 1415  BP: 140/76   (!) 142/80  Pulse: 93 (!) 101 (!) 108 (!) 104  Resp: (!) 22 17 17 16   Temp: (!) 97.5 F (36.4 C)   98.5 F (36.9 C)  TempSrc: Oral   Oral  SpO2: 95% 95% 93% 94%    Intake/Output Summary (Last 24 hours) at 04/29/2018 1622 Last data filed at 04/29/2018 1500 Gross per 24 hour  Intake 1617.81 ml  Output 0 ml  Net 1617.81 ml   There were no vitals filed for this visit.  Examination:  General exam: Appears calm and comfortable  Respiratory system: Clear to auscultation. No wheezes,crackle or rhonchi Cardiovascular system: S1 & S2 heard, RRR. No JVD, murmurs, rubs or gallops Gastrointestinal system: Abd soft, mild distended, non tender, hyperactive bowel sounds  Central nervous system: Alert and oriented. No focal neurological deficits. Extremities: No pedal edema.  Skin: No rashes Psychiatry:  Mood & affect appropriate.    Data Reviewed: I have personally reviewed following labs and imaging studies  CBC: Recent Labs  Lab 04/28/18 1328 04/29/18 0555  WBC 7.5 5.3  NEUTROABS 5.3  --   HGB 10.1* 9.6*  HCT 32.9* 32.3*  MCV 100.9* 102.2*  PLT 345 882   Basic Metabolic Panel: Recent Labs  Lab 04/28/18 1328 04/29/18 0555  NA 138 136  K 4.3 4.8  CL 107 110  CO2 23 19*  GLUCOSE 84 120*  BUN 19 21  CREATININE 1.22* 1.18*  CALCIUM 8.3* 7.7*   GFR: Estimated Creatinine Clearance: 32.6 mL/min (A) (by C-G formula based on SCr of 1.18 mg/dL (H)). Liver Function Tests: Recent Labs  Lab 04/28/18 1328 04/29/18 0555  AST 16 12*  ALT 14 13  ALKPHOS 53 47  BILITOT 0.7 0.5  PROT 5.7* 5.4*  ALBUMIN 2.3* 2.1*   Recent Labs  Lab 04/28/18 1328  LIPASE 18   No results for input(s): AMMONIA in the last 168 hours. Coagulation Profile: No results for input(s): INR,  PROTIME in the last 168 hours. Cardiac Enzymes: No results for input(s): CKTOTAL, CKMB, CKMBINDEX, TROPONINI in the last 168 hours. BNP (last 3 results) No results for input(s): PROBNP in the last 8760 hours. HbA1C: No results for input(s): HGBA1C in the last 72 hours. CBG: No results for input(s): GLUCAP in the last 168 hours. Lipid Profile: No results for input(s): CHOL, HDL, LDLCALC, TRIG, CHOLHDL, LDLDIRECT in the last 72 hours. Thyroid Function Tests: No results for input(s): TSH, T4TOTAL, FREET4, T3FREE, THYROIDAB in the last 72 hours. Anemia Panel: Recent Labs    04/29/18 0946  VITAMINB12 1,502*  FOLATE 8.1  FERRITIN 476*  TIBC 149*  IRON 40   Sepsis Labs: No results for input(s): PROCALCITON, LATICACIDVEN in the last 168 hours.  Recent Results (from the past 240 hour(s))  Gastrointestinal Panel by PCR , Stool     Status: None   Collection Time: 04/29/18  9:31 AM  Result Value Ref Range Status   Campylobacter species NOT DETECTED NOT DETECTED Final   Plesimonas shigelloides NOT DETECTED NOT DETECTED Final   Salmonella species NOT DETECTED NOT DETECTED Final   Yersinia enterocolitica NOT DETECTED NOT DETECTED Final   Vibrio species NOT DETECTED NOT DETECTED Final   Vibrio cholerae NOT DETECTED NOT DETECTED Final   Enteroaggregative E coli (EAEC) NOT DETECTED NOT DETECTED Final   Enteropathogenic E coli (EPEC) NOT DETECTED NOT DETECTED Final   Enterotoxigenic E coli (ETEC) NOT DETECTED NOT DETECTED Final   Shiga like toxin producing E coli (STEC) NOT DETECTED NOT DETECTED Final   Shigella/Enteroinvasive E coli (EIEC) NOT DETECTED NOT DETECTED Final   Cryptosporidium NOT DETECTED NOT DETECTED Final   Cyclospora cayetanensis NOT DETECTED NOT DETECTED Final   Entamoeba histolytica NOT DETECTED NOT DETECTED Final   Giardia lamblia NOT DETECTED NOT DETECTED Final   Adenovirus F40/41 NOT DETECTED NOT DETECTED Final   Astrovirus NOT DETECTED NOT DETECTED Final   Norovirus  GI/GII NOT DETECTED NOT DETECTED Final   Rotavirus A NOT DETECTED NOT DETECTED Final   Sapovirus (I, II, IV, and V) NOT DETECTED NOT DETECTED Final    Comment: Performed at [facility], [address] [place], [place] [address]      Radiology Studies: Dg Chest 2 View  Result Date: 04/28/2018 CLINICAL DATA:  Shortness of breath. EXAM: CHEST - 2 VIEW COMPARISON:  Chest CT dated 08/13/2016 and chest x-ray dated 06/17/2016 FINDINGS: The heart size and pulmonary vascularity are normal. Lungs are clear. No effusions. Aortic atherosclerosis. Old compression fractures in the midthoracic spine. No acute bone abnormality. IMPRESSION: No acute abnormalities. Aortic Atherosclerosis (ICD10-I70.0). Electronically Signed   By: Lorriane Shire M.D.   On: 04/28/2018 13:49      Scheduled Meds: . budesonide (PULMICORT) nebulizer solution  0.25 mg Nebulization BID  . buPROPion  150 mg Oral Daily  . enoxaparin (LOVENOX) injection  40 mg Subcutaneous Q24H  . ipratropium  0.5 mg Nebulization TID  . levalbuterol  0.63 mg Nebulization  TID  . predniSONE  40 mg Oral Q breakfast  . rosuvastatin  20 mg Oral Daily   Continuous Infusions: . sodium chloride 75 mL/hr at 04/29/18 1500     LOS: 0 days    Time spent: Total of 25 minutes spent with pt, greater than 50% of which was spent in discussion of  treatment, counseling and coordination of care   Chipper Oman, MD Pager: Text Page via [email or web address]   If 7PM-7AM, please contact night-coverage [email or web address] 04/29/2018, 4:22 PM   Note - This record has been created using [employer]. Chart creation errors have been sought, but may not always have been located. Such creation errors do not reflect on the standard of medical care.

## 2018-04-30 LAB — C DIFFICILE QUICK SCREEN W PCR REFLEX
C Diff antigen: POSITIVE — AB
C Diff toxin: NEGATIVE

## 2018-04-30 LAB — CLOSTRIDIUM DIFFICILE BY PCR, REFLEXED: Toxigenic C. Difficile by PCR: POSITIVE — AB

## 2018-04-30 LAB — LACTOFERRIN, FECAL, QUALITATIVE: Lactoferrin, Fecal, Qual: POSITIVE — AB

## 2018-04-30 NOTE — Progress Notes (Signed)
Pancreatic stool test unable to be completed. Stool must be formed.  Kizzie Ide, RN

## 2018-04-30 NOTE — Progress Notes (Signed)
[facility] PROGRESS NOTE    Progress Note  Andrea Larson  XTA:[id number] DOB: 1945-10-26 DOA: 04/28/2018 PCP: Biagio Borg, MD     Brief Narrative:   Andrea Larson is an 72 y.o. female past medical history significant for COPD hyperlipidemia alcohol abuse chronic kidney disease who presents to the emergency room complaining of diarrhea was recently discharged from the hospital and antibiotics, and the ED was found to have a creatinine of 1.2  Assessment/Plan:   Diarrhea: With ongoing diarrhea. GI pathogen negative, C. difficile antigen positive toxin negative. Further management per GI. Continue clear liquid diet.``  Hyperlipidemia Continue statins.  Macrocytic anemia: Likely due to alcohol abuse and/or chronic kidney disease stage III No signs of overt bleeding. Anemia panel showed a ferritin of 476 and saturation of  27.  Chronic kidney disease stage III: Creatinine seems to be at baseline.  Tobacco abuse/alcohol abuse: In remission.  Anxiety: Continue medications.  Mild COPD exacerbation: No wheezing on physical exam satting more than 90% on room air discontinue steroids.  DVT prophylaxis: Lovenox Family Communication:none Disposition Plan/Barrier to D/C: home in am Code Status:     Code Status Orders  (From admission, onward)         Start     Ordered   04/28/18 1630  Full code  Continuous     04/28/18 1629        Code Status History    Date Active Date Inactive Code Status Order ID Comments User Context   04/11/2018 2232 04/14/2018 1553 Full Code [id number]  Rise Patience, MD Inpatient        IV Access:    Peripheral IV   Procedures and diagnostic studies:   Dg Chest 2 View  Result Date: 04/28/2018 CLINICAL DATA:  Shortness of breath. EXAM: CHEST - 2 VIEW COMPARISON:  Chest CT dated 08/13/2016 and chest x-ray dated 06/17/2016 FINDINGS: The heart size and pulmonary vascularity are normal. Lungs are clear. No  effusions. Aortic atherosclerosis. Old compression fractures in the midthoracic spine. No acute bone abnormality. IMPRESSION: No acute abnormalities. Aortic Atherosclerosis (ICD10-I70.0). Electronically Signed   By: Lorriane Shire M.D.   On: 04/28/2018 13:49     Medical Consultants:    None.  Anti-Infectives:     Subjective:    [name] she relates her watery stools are improving today they are more formed  Objective:    Vitals:   04/29/18 2021 04/29/18 2035 04/30/18 0503 04/30/18 0753  BP:  (!) 133/94 (!) 142/79   Pulse:  98 92   Resp:  18 17   Temp:  98.1 F (36.7 C) 98.2 F (36.8 C)   TempSrc:  Oral Oral   SpO2: 95% 98% 97% 95%    Intake/Output Summary (Last 24 hours) at 04/30/2018 1052 Last data filed at 04/29/2018 2051 Gross per 24 hour  Intake 1085.29 ml  Output -  Net 1085.29 ml   There were no vitals filed for this visit.  Exam: General exam: In no acute distress. Respiratory system: Good air movement and clear to auscultation. Cardiovascular system: S1 & S2 heard, RRR.  Gastrointestinal system: Abdomen is nondistended, soft and nontender.  Central nervous system: Alert and oriented. No focal neurological deficits. Extremities: No pedal edema. Skin: No rashes, lesions or ulcers Psychiatry: Judgement and insight appear normal. Mood & affect appropriate.    Data Reviewed:    Labs: Basic Metabolic Panel: Recent Labs  Lab 04/28/18 1328 04/29/18 0555  NA 138 136  K 4.3 4.8  CL 107 110  CO2 23 19*  GLUCOSE 84 120*  BUN 19 21  CREATININE 1.22* 1.18*  CALCIUM 8.3* 7.7*   GFR Estimated Creatinine Clearance: 32.6 mL/min (A) (by C-G formula based on SCr of 1.18 mg/dL (H)). Liver Function Tests: Recent Labs  Lab 04/28/18 1328 04/29/18 0555  AST 16 12*  ALT 14 13  ALKPHOS 53 47  BILITOT 0.7 0.5  PROT 5.7* 5.4*  ALBUMIN 2.3* 2.1*   Recent Labs  Lab 04/28/18 1328  LIPASE 18   No results for input(s): AMMONIA in the last 168  hours. Coagulation profile No results for input(s): INR, PROTIME in the last 168 hours.  CBC: Recent Labs  Lab 04/28/18 1328 04/29/18 0555  WBC 7.5 5.3  NEUTROABS 5.3  --   HGB 10.1* 9.6*  HCT 32.9* 32.3*  MCV 100.9* 102.2*  PLT 345 328   Cardiac Enzymes: No results for input(s): CKTOTAL, CKMB, CKMBINDEX, TROPONINI in the last 168 hours. BNP (last 3 results) No results for input(s): PROBNP in the last 8760 hours. CBG: No results for input(s): GLUCAP in the last 168 hours. D-Dimer: No results for input(s): DDIMER in the last 72 hours. Hgb A1c: No results for input(s): HGBA1C in the last 72 hours. Lipid Profile: No results for input(s): CHOL, HDL, LDLCALC, TRIG, CHOLHDL, LDLDIRECT in the last 72 hours. Thyroid function studies: No results for input(s): TSH, T4TOTAL, T3FREE, THYROIDAB in the last 72 hours.  Invalid input(s): FREET3 Anemia work up: Recent Labs    04/29/18 0946  VITAMINB12 1,502*  FOLATE 8.1  FERRITIN 476*  TIBC 149*  IRON 40   Sepsis Labs: Recent Labs  Lab 04/28/18 1328 04/29/18 0555  WBC 7.5 5.3   Microbiology Recent Results (from the past 240 hour(s))  Gastrointestinal Panel by PCR , Stool     Status: None   Collection Time: 04/29/18  9:31 AM  Result Value Ref Range Status   Campylobacter species NOT DETECTED NOT DETECTED Final   Plesimonas shigelloides NOT DETECTED NOT DETECTED Final   Salmonella species NOT DETECTED NOT DETECTED Final   Yersinia enterocolitica NOT DETECTED NOT DETECTED Final   Vibrio species NOT DETECTED NOT DETECTED Final   Vibrio cholerae NOT DETECTED NOT DETECTED Final   Enteroaggregative E coli (EAEC) NOT DETECTED NOT DETECTED Final   Enteropathogenic E coli (EPEC) NOT DETECTED NOT DETECTED Final   Enterotoxigenic E coli (ETEC) NOT DETECTED NOT DETECTED Final   Shiga like toxin producing E coli (STEC) NOT DETECTED NOT DETECTED Final   Shigella/Enteroinvasive E coli (EIEC) NOT DETECTED NOT DETECTED Final    Cryptosporidium NOT DETECTED NOT DETECTED Final   Cyclospora cayetanensis NOT DETECTED NOT DETECTED Final   Entamoeba histolytica NOT DETECTED NOT DETECTED Final   Giardia lamblia NOT DETECTED NOT DETECTED Final   Adenovirus F40/41 NOT DETECTED NOT DETECTED Final   Astrovirus NOT DETECTED NOT DETECTED Final   Norovirus GI/GII NOT DETECTED NOT DETECTED Final   Rotavirus A NOT DETECTED NOT DETECTED Final   Sapovirus (I, II, IV, and V) NOT DETECTED NOT DETECTED Final    Comment: Performed at [facility], [address] [place], [place] [address]  C difficile quick scan w PCR reflex     Status: Abnormal   Collection Time: 04/30/18  8:43 AM  Result Value Ref Range Status   C Diff antigen POSITIVE (A) NEGATIVE Final   C Diff toxin NEGATIVE NEGATIVE Final   C Diff interpretation Results are indeterminate. See  PCR results.  Final    Comment: Performed at [facility] [address], [place] [address], [place], [place] [address]     Medications:   . budesonide (PULMICORT) nebulizer solution  0.25 mg Nebulization BID  . buPROPion  150 mg Oral Daily  . enoxaparin (LOVENOX) injection  40 mg Subcutaneous Q24H  . ipratropium  0.5 mg Nebulization TID  . levalbuterol  0.63 mg Nebulization TID  . predniSONE  40 mg Oral Q breakfast  . rosuvastatin  20 mg Oral Daily   Continuous Infusions: . sodium chloride 75 mL/hr at 04/30/18 0953      LOS: 0 days   Charlynne Cousins  [facility] Pager [phone]  *Please refer to [email or web address], password [email or web address] to get updated schedule on who will round on this patient, as hospitalists switch teams weekly. If 7PM-7AM, please contact night-coverage at [email or web address], password [email or web address] for any overnight needs.  04/30/2018, 10:52 AM

## 2018-04-30 NOTE — Progress Notes (Signed)
     [facility] Progress Note   Chief Complaint:   diarrhea    SUBJECTIVE:   stools partially formed now. Volume and frequency much less than prior to admission but "I pass a small amount of stool every time I go to the bathroom".    ASSESSMENT AND PLAN:   1. Acute diarrhea. Ruling out infectious. -GI path panel negative -C-diff pending -fecal elastase cancelled by lab- stool too loose -On clear diet, will give solids today  2. Macrocytic anemia - folate, B12 normal.  -no evidence for component of iron def based on iron studies.  -TSH normal   OBJECTIVE:     Vital signs in last 24 hours: Temp:  [98.1 F (36.7 C)-98.5 F (36.9 C)] 98.2 F (36.8 C) (10/23 0503) Pulse Rate:  [92-108] 92 (10/23 0503) Resp:  [16-18] 17 (10/23 0503) BP: (133-142)/(79-94) 142/79 (10/23 0503) SpO2:  [93 %-98 %] 95 % (10/23 0753) Last BM Date: 04/30/18 General:   Alert, well-developed female in NAD EENT:  Normal hearing, non icteric sclera, conjunctive pink.  Heart:  Regular rate and rhythm, no lower extremity edema Pulm: Normal respiratory effort, a few scattered wheezes. Abdomen:  Soft, nondistended, nontender.  Normal bowel sounds, no masses felt.     Neurologic:  Alert and  oriented x4;  grossly normal neurologically. Psych:  Pleasant, cooperative.  Normal mood and affect.   Intake/Output from previous day: 10/22 0701 - 10/23 0700 In: 1085.3 [I.V.:1085.3] Out: -  Intake/Output this shift: No intake/output data recorded.  Lab Results: Recent Labs    04/28/18 1328 04/29/18 0555  WBC 7.5 5.3  HGB 10.1* 9.6*  HCT 32.9* 32.3*  PLT 345 328   BMET Recent Labs    04/28/18 1328 04/29/18 0555  NA 138 136  K 4.3 4.8  CL 107 110  CO2 23 19*  GLUCOSE 84 120*  BUN 19 21  CREATININE 1.22* 1.18*  CALCIUM 8.3* 7.7*   LFT Recent Labs    04/29/18 0555  PROT 5.4*  ALBUMIN 2.1*  AST 12*  ALT 13  ALKPHOS 47  BILITOT 0.5    Dg Chest 2 View  Result  Date: 04/28/2018 CLINICAL DATA:  Shortness of breath. EXAM: CHEST - 2 VIEW COMPARISON:  Chest CT dated 08/13/2016 and chest x-ray dated 06/17/2016 FINDINGS: The heart size and pulmonary vascularity are normal. Lungs are clear. No effusions. Aortic atherosclerosis. Old compression fractures in the midthoracic spine. No acute bone abnormality. IMPRESSION: No acute abnormalities. Aortic Atherosclerosis (ICD10-I70.0). Electronically Signed   By: Lorriane Shire M.D.   On: 04/28/2018 13:49     Principal Problem:   Diarrhea Active Problems:   Hyperlipidemia   COPD (chronic obstructive pulmonary disease) (HCC)   COPD exacerbation (HCC)   Essential hypertension   CKD (chronic kidney disease), stage III ([place])     LOS: 0 days   Tye Savoy ,NP 04/30/2018, 9:22 AM

## 2018-04-30 NOTE — Care Management Obs Status (Signed)
[place] NOTIFICATION   Patient Details  Name: FREDRICKA KOHRS MRN: [id number] Date of Birth: [birth year]   Medicare Observation Status Notification Given:  Yes    Leeroy Cha, RN 04/30/2018, 4:12 PM

## 2018-05-01 MED ORDER — GERHARDT'S BUTT CREAM
TOPICAL_CREAM | Freq: Two times a day (BID) | CUTANEOUS | Status: DC | PRN
  Filled 2018-05-01: qty 1

## 2018-05-01 MED ORDER — WHITE PETROLATUM EX OINT
TOPICAL_OINTMENT | CUTANEOUS | Status: DC | PRN
  Filled 2018-05-01: qty 5

## 2018-05-01 MED ORDER — VANCOMYCIN 50 MG/ML ORAL SOLUTION
125.0000 mg | Freq: Four times a day (QID) | ORAL | Status: DC
  Administered 2018-05-01 – 2018-05-06 (×21): 125 mg via ORAL
  Filled 2018-05-01 (×23): qty 2.5

## 2018-05-01 NOTE — Progress Notes (Signed)
    Progress Note   Assessment / Plan:    1.  Acute diarrhea -GI path panel negative, C. difficile quick scan with positive antigen and negative toxin, C. difficile PCR positive -We will treat for C. difficile with Vancomycin, 125 mg p.o. every 6 hours x10 days  2.  Macrocytic anemia -Folate, B12 normal, no evidence for complaint of iron deficiency based on iron studies, TSH normal  Please await any further recommendations from Dr. Rush Landmark later today. Thank you for kind consultation, we will continue to follow along.   Subjective  Chief Complaint: Diarrhea  Patient tells me that all of her diarrhea started again last night after eating some soup for dinner, 2 episodes last night and another 7 throughout the night and this morning.  Continues with generalized abdominal pain.   Objective   Vital signs in last 24 hours: Temp:  [97.6 F (36.4 C)-98.7 F (37.1 C)] 97.6 F (36.4 C) (10/24 0557) Pulse Rate:  [95-98] 96 (10/24 0557) Resp:  [15-21] 21 (10/24 0557) BP: (133-150)/(90-100) 150/94 (10/24 0557) SpO2:  [92 %-97 %] 96 % (10/24 0800) Last BM Date: 05/01/18 General:    Elderly white female in NAD Heart:  Regular rate and rhythm; no murmurs Lungs: Respirations even and unlabored, lungs CTA bilaterally Abdomen:  Soft,  Mild generalized ttp and nondistended. Normal bowel sounds. Extremities:  Without edema. Neurologic:  Alert and oriented,  grossly normal neurologically. Psych:  Cooperative. Normal mood and affect.  Intake/Output from previous day: 10/23 0701 - 10/24 0700 In: 600 [P.O.:600] Out: -    Lab Results: Recent Labs    04/28/18 1328 04/29/18 0555  WBC 7.5 5.3  HGB 10.1* 9.6*  HCT 32.9* 32.3*  PLT 345 328   BMET Recent Labs    04/28/18 1328 04/29/18 0555  NA 138 136  K 4.3 4.8  CL 107 110  CO2 23 19*  GLUCOSE 84 120*  BUN 19 21  CREATININE 1.22* 1.18*  CALCIUM 8.3* 7.7*   LFT Recent Labs    04/29/18 0555  PROT 5.4*  ALBUMIN 2.1*  AST  12*  ALT 13  ALKPHOS 47  BILITOT 0.5    LOS: 0 days   Levin Erp  05/01/2018, 9:54 AM

## 2018-05-01 NOTE — Progress Notes (Signed)
[facility] PROGRESS NOTE    Progress Note  Andrea Larson  QBH:[id number] DOB: August 06, 1945 DOA: 04/28/2018 PCP: Biagio Borg, MD     Brief Narrative:   Andrea Larson is an 72 y.o. female past medical history significant for COPD hyperlipidemia alcohol abuse chronic kidney disease who presents to the emergency room complaining of diarrhea was recently discharged from the hospital and antibiotics, and the ED was found to have a creatinine of 1.2  Assessment/Plan:   Diarrhea: GI pathogen negative, C. difficile antigen positive toxin negative. Further management per GI. Worsening diarrhea, will discuss with GI, will start on oral VAnc.  Hyperlipidemia Continue statins.  Macrocytic anemia: Likely due to alcohol abuse and/or chronic kidney disease stage III No signs of overt bleeding. Anemia panel showed a ferritin of 476 and saturation of  27.  Chronic kidney disease stage III: Creatinine seems to be at baseline.  Tobacco abuse/alcohol abuse: In remission.  Anxiety: Continue medications.  Mild COPD exacerbation: No wheezing on physical exam satting more than 90% on room air discontinue steroids.  DVT prophylaxis: Lovenox Family Communication:none Disposition Plan/Barrier to D/C: Unable to determine. Code Status:     Code Status Orders  (From admission, onward)         Start     Ordered   04/28/18 1630  Full code  Continuous     04/28/18 1629        Code Status History    Date Active Date Inactive Code Status Order ID Comments User Context   04/11/2018 2232 04/14/2018 1553 Full Code [id number]  Rise Patience, MD Inpatient        IV Access:    Peripheral IV   Procedures and diagnostic studies:   No results found.   Medical Consultants:    None.  Anti-Infectives:     Subjective:    Andrea Larson she relates her stool was more watery today has been had about 7-8 bowel watery bowel movements.  Objective:     Vitals:   04/30/18 2022 04/30/18 2038 05/01/18 0557 05/01/18 0800  BP:  (!) 140/100 (!) 150/94   Pulse:  95 96   Resp:  15 (!) 21   Temp:  97.7 F (36.5 C) 97.6 F (36.4 C)   TempSrc:  Oral Oral   SpO2: 96% 96% 97% 96%    Intake/Output Summary (Last 24 hours) at 05/01/2018 0936 Last data filed at 05/01/2018 2426 Gross per 24 hour  Intake 480 ml  Output -  Net 480 ml   There were no vitals filed for this visit.  Exam: General exam: In no acute distress. Respiratory system: Good air movement and clear to auscultation. Cardiovascular system: S1 & S2 heard, RRR.  Gastrointestinal system: Abdomen is nondistended, soft and nontender.  Central nervous system: Alert and oriented. No focal neurological deficits. Extremities: No pedal edema. Skin: No rashes, lesions or ulcers Psychiatry: Judgement and insight appear normal. Mood & affect appropriate.    Data Reviewed:    Labs: Basic Metabolic Panel: Recent Labs  Lab 04/28/18 1328 04/29/18 0555  NA 138 136  K 4.3 4.8  CL 107 110  CO2 23 19*  GLUCOSE 84 120*  BUN 19 21  CREATININE 1.22* 1.18*  CALCIUM 8.3* 7.7*   GFR Estimated Creatinine Clearance: 32.6 mL/min (A) (by C-G formula based on SCr of 1.18 mg/dL (H)). Liver Function Tests: Recent Labs  Lab 04/28/18 1328 04/29/18 0555  AST 16 12*  ALT 14 13  ALKPHOS 53 47  BILITOT 0.7 0.5  PROT 5.7* 5.4*  ALBUMIN 2.3* 2.1*   Recent Labs  Lab 04/28/18 1328  LIPASE 18   No results for input(s): AMMONIA in the last 168 hours. Coagulation profile No results for input(s): INR, PROTIME in the last 168 hours.  CBC: Recent Labs  Lab 04/28/18 1328 04/29/18 0555  WBC 7.5 5.3  NEUTROABS 5.3  --   HGB 10.1* 9.6*  HCT 32.9* 32.3*  MCV 100.9* 102.2*  PLT 345 328   Cardiac Enzymes: No results for input(s): CKTOTAL, CKMB, CKMBINDEX, TROPONINI in the last 168 hours. BNP (last 3 results) No results for input(s): PROBNP in the last 8760 hours. CBG: No results  for input(s): GLUCAP in the last 168 hours. D-Dimer: No results for input(s): DDIMER in the last 72 hours. Hgb A1c: No results for input(s): HGBA1C in the last 72 hours. Lipid Profile: No results for input(s): CHOL, HDL, LDLCALC, TRIG, CHOLHDL, LDLDIRECT in the last 72 hours. Thyroid function studies: No results for input(s): TSH, T4TOTAL, T3FREE, THYROIDAB in the last 72 hours.  Invalid input(s): FREET3 Anemia work up: Recent Labs    04/29/18 0946  VITAMINB12 1,502*  FOLATE 8.1  FERRITIN 476*  TIBC 149*  IRON 40   Sepsis Labs: Recent Labs  Lab 04/28/18 1328 04/29/18 0555  WBC 7.5 5.3   Microbiology Recent Results (from the past 240 hour(s))  Gastrointestinal Panel by PCR , Stool     Status: None   Collection Time: 04/29/18  9:31 AM  Result Value Ref Range Status   Campylobacter species NOT DETECTED NOT DETECTED Final   Plesimonas shigelloides NOT DETECTED NOT DETECTED Final   Salmonella species NOT DETECTED NOT DETECTED Final   Yersinia enterocolitica NOT DETECTED NOT DETECTED Final   Vibrio species NOT DETECTED NOT DETECTED Final   Vibrio cholerae NOT DETECTED NOT DETECTED Final   Enteroaggregative E coli (EAEC) NOT DETECTED NOT DETECTED Final   Enteropathogenic E coli (EPEC) NOT DETECTED NOT DETECTED Final   Enterotoxigenic E coli (ETEC) NOT DETECTED NOT DETECTED Final   Shiga like toxin producing E coli (STEC) NOT DETECTED NOT DETECTED Final   Shigella/Enteroinvasive E coli (EIEC) NOT DETECTED NOT DETECTED Final   Cryptosporidium NOT DETECTED NOT DETECTED Final   Cyclospora cayetanensis NOT DETECTED NOT DETECTED Final   Entamoeba histolytica NOT DETECTED NOT DETECTED Final   Giardia lamblia NOT DETECTED NOT DETECTED Final   Adenovirus F40/41 NOT DETECTED NOT DETECTED Final   Astrovirus NOT DETECTED NOT DETECTED Final   Norovirus GI/GII NOT DETECTED NOT DETECTED Final   Rotavirus A NOT DETECTED NOT DETECTED Final   Sapovirus (I, II, IV, and V) NOT DETECTED NOT  DETECTED Final    Comment: Performed at [facility], [address] [place], [place] [address]  C difficile quick scan w PCR reflex     Status: Abnormal   Collection Time: 04/30/18  8:43 AM  Result Value Ref Range Status   C Diff antigen POSITIVE (A) NEGATIVE Final   C Diff toxin NEGATIVE NEGATIVE Final   C Diff interpretation Results are indeterminate. See PCR results.  Final    Comment: Performed at [facility], [place] [address], [place], [place] [address]  C. Diff by PCR, Reflexed     Status: Abnormal   Collection Time: 04/30/18  8:43 AM  Result Value Ref Range Status   Toxigenic C. Difficile by PCR POSITIVE (A) NEGATIVE Final    Comment: Positive for toxigenic C.  difficile with little to no toxin production. Only treat if clinical presentation suggests symptomatic illness. Performed at [facility], [place] [address], [place], [place] [address]      Medications:   . budesonide (PULMICORT) nebulizer solution  0.25 mg Nebulization BID  . buPROPion  150 mg Oral Daily  . enoxaparin (LOVENOX) injection  40 mg Subcutaneous Q24H  . ipratropium  0.5 mg Nebulization TID  . levalbuterol  0.63 mg Nebulization TID  . rosuvastatin  20 mg Oral Daily  . vancomycin  125 mg Oral QID   Continuous Infusions: . sodium chloride 75 mL/hr at 04/30/18 2249      LOS: 0 days   Charlynne Cousins  [facility] Pager [phone]  *Please refer to [email or web address], password [email or web address] to get updated schedule on who will round on this patient, as hospitalists switch teams weekly. If 7PM-7AM, please contact night-coverage at [email or web address], password [email or web address] for any overnight needs.  05/01/2018, 9:36 AM

## 2018-05-01 NOTE — Progress Notes (Signed)
PT Cancellation Note  Patient Details Name: Andrea Larson MRN: [id number] DOB: 1945-08-09   Cancelled Treatment:    Reason Eval/Treat Not Completed: Fatigue/lethargy limiting ability to participate Pt declines to participate at this time stating she has been up and down all night to toilet due to diarrhea.     Rodgers Likes,KATHrine E 05/01/2018, 2:52 PM Carmelia Bake, PT, DPT Acute Rehabilitation Services Office: [phone] Pager: [phone]

## 2018-05-02 DIAGNOSIS — Z87440 Personal history of urinary (tract) infections: Secondary | ICD-10-CM | POA: Diagnosis not present

## 2018-05-02 DIAGNOSIS — Z888 Allergy status to other drugs, medicaments and biological substances status: Secondary | ICD-10-CM | POA: Diagnosis not present

## 2018-05-02 DIAGNOSIS — A0472 Enterocolitis due to Clostridium difficile, not specified as recurrent: Secondary | ICD-10-CM | POA: Diagnosis present

## 2018-05-02 DIAGNOSIS — D509 Iron deficiency anemia, unspecified: Secondary | ICD-10-CM | POA: Diagnosis present

## 2018-05-02 DIAGNOSIS — R197 Diarrhea, unspecified: Secondary | ICD-10-CM | POA: Diagnosis present

## 2018-05-02 DIAGNOSIS — Z86010 Personal history of colonic polyps: Secondary | ICD-10-CM | POA: Diagnosis not present

## 2018-05-02 DIAGNOSIS — F329 Major depressive disorder, single episode, unspecified: Secondary | ICD-10-CM | POA: Diagnosis present

## 2018-05-02 DIAGNOSIS — Z8249 Family history of ischemic heart disease and other diseases of the circulatory system: Secondary | ICD-10-CM | POA: Diagnosis not present

## 2018-05-02 DIAGNOSIS — Z79899 Other long term (current) drug therapy: Secondary | ICD-10-CM | POA: Diagnosis not present

## 2018-05-02 DIAGNOSIS — A09 Infectious gastroenteritis and colitis, unspecified: Secondary | ICD-10-CM | POA: Diagnosis not present

## 2018-05-02 DIAGNOSIS — J449 Chronic obstructive pulmonary disease, unspecified: Secondary | ICD-10-CM | POA: Diagnosis not present

## 2018-05-02 DIAGNOSIS — J441 Chronic obstructive pulmonary disease with (acute) exacerbation: Secondary | ICD-10-CM | POA: Diagnosis present

## 2018-05-02 DIAGNOSIS — F419 Anxiety disorder, unspecified: Secondary | ICD-10-CM | POA: Diagnosis present

## 2018-05-02 DIAGNOSIS — R32 Unspecified urinary incontinence: Secondary | ICD-10-CM | POA: Diagnosis present

## 2018-05-02 DIAGNOSIS — E785 Hyperlipidemia, unspecified: Secondary | ICD-10-CM | POA: Diagnosis present

## 2018-05-02 DIAGNOSIS — K5730 Diverticulosis of large intestine without perforation or abscess without bleeding: Secondary | ICD-10-CM | POA: Diagnosis present

## 2018-05-02 DIAGNOSIS — I350 Nonrheumatic aortic (valve) stenosis: Secondary | ICD-10-CM | POA: Diagnosis present

## 2018-05-02 DIAGNOSIS — I2510 Atherosclerotic heart disease of native coronary artery without angina pectoris: Secondary | ICD-10-CM | POA: Diagnosis present

## 2018-05-02 DIAGNOSIS — I129 Hypertensive chronic kidney disease with stage 1 through stage 4 chronic kidney disease, or unspecified chronic kidney disease: Secondary | ICD-10-CM | POA: Diagnosis present

## 2018-05-02 DIAGNOSIS — Z87891 Personal history of nicotine dependence: Secondary | ICD-10-CM | POA: Diagnosis not present

## 2018-05-02 DIAGNOSIS — Z7951 Long term (current) use of inhaled steroids: Secondary | ICD-10-CM | POA: Diagnosis not present

## 2018-05-02 DIAGNOSIS — D539 Nutritional anemia, unspecified: Secondary | ICD-10-CM | POA: Diagnosis present

## 2018-05-02 DIAGNOSIS — F1011 Alcohol abuse, in remission: Secondary | ICD-10-CM | POA: Diagnosis present

## 2018-05-02 DIAGNOSIS — I10 Essential (primary) hypertension: Secondary | ICD-10-CM | POA: Diagnosis not present

## 2018-05-02 DIAGNOSIS — R634 Abnormal weight loss: Secondary | ICD-10-CM | POA: Diagnosis present

## 2018-05-02 DIAGNOSIS — K219 Gastro-esophageal reflux disease without esophagitis: Secondary | ICD-10-CM | POA: Diagnosis present

## 2018-05-02 DIAGNOSIS — N183 Chronic kidney disease, stage 3 (moderate): Secondary | ICD-10-CM | POA: Diagnosis present

## 2018-05-02 LAB — BASIC METABOLIC PANEL
Anion gap: 10 (ref 5–15)
BUN: 10 mg/dL (ref 8–23)
CO2: 16 mmol/L — ABNORMAL LOW (ref 22–32)
Calcium: 7.3 mg/dL — ABNORMAL LOW (ref 8.9–10.3)
Chloride: 112 mmol/L — ABNORMAL HIGH (ref 98–111)
Creatinine, Ser: 1.13 mg/dL — ABNORMAL HIGH (ref 0.44–1.00)
GFR calc Af Amer: 54 mL/min — ABNORMAL LOW (ref >60)
GFR calc non Af Amer: 47 mL/min — ABNORMAL LOW (ref >60)
Glucose, Bld: 55 mg/dL — ABNORMAL LOW (ref 70–99)
Potassium: 3.4 mmol/L — ABNORMAL LOW (ref 3.5–5.1)
Sodium: 138 mmol/L (ref 135–145)

## 2018-05-02 MED ORDER — POTASSIUM CHLORIDE CRYS ER 20 MEQ PO TBCR
40.0000 meq | EXTENDED_RELEASE_TABLET | Freq: Two times a day (BID) | ORAL | Status: AC
  Administered 2018-05-02 (×2): 40 meq via ORAL
  Filled 2018-05-02 (×2): qty 2

## 2018-05-02 MED ORDER — ZOLPIDEM TARTRATE 5 MG PO TABS: 5.0000 mg | ORAL_TABLET | Freq: Every evening | ORAL | Status: DC | PRN

## 2018-05-02 MED ORDER — LOPERAMIDE HCL 2 MG PO CAPS
2.0000 mg | ORAL_CAPSULE | Freq: Two times a day (BID) | ORAL | Status: DC | PRN
  Administered 2018-05-02 (×2): 2 mg via ORAL
  Filled 2018-05-02 (×2): qty 1

## 2018-05-02 MED ORDER — VANCOMYCIN HCL 125 MG PO CAPS: 125.0000 mg | ORAL_CAPSULE | Freq: Four times a day (QID) | ORAL | 0 refills | Status: DC

## 2018-05-02 MED ORDER — ACETAMINOPHEN 325 MG PO TABS
650.0000 mg | ORAL_TABLET | Freq: Four times a day (QID) | ORAL | Status: DC | PRN
  Administered 2018-05-02 – 2018-05-06 (×4): 650 mg via ORAL
  Filled 2018-05-02 (×4): qty 2

## 2018-05-02 MED ORDER — LOPERAMIDE HCL 2 MG PO CAPS: 2.0000 mg | ORAL_CAPSULE | Freq: Three times a day (TID) | ORAL | Status: DC

## 2018-05-02 NOTE — Progress Notes (Signed)
PT Cancellation Note  Patient Details Name: Andrea Larson MRN: [id number] DOB: 26-Nov-1945   Cancelled Treatment:    Reason Eval/Treat Not Completed: Medical issues which prohibited therapy. Patient having diarrhea. Check back another time.   Claretha Cooper 05/02/2018, 9:38 AM  [place] Pager [phone] Office [phone]

## 2018-05-02 NOTE — Progress Notes (Signed)
Pharmacy - Brief Note  Vancomycin capsule cost  Per [facility],  Vancomycin caps will be ~$93 (patient in coverage gap) The liquid they have at [facility] is not covered.    [facility] compounds vancomycin liquid but is not open on weekends.  May be option if remains hospitalized until next week.  Will follow-up if remains in hospital Monday.    Doreene Eland, PharmD, BCPS.   Work Cell: [phone] 05/02/2018 4:14 PM

## 2018-05-02 NOTE — Progress Notes (Addendum)
[facility] PROGRESS NOTE    Progress Note  Andrea Larson  OEU:[id number] DOB: [birth year] DOA: 04/28/2018 PCP: Biagio Borg, MD     Brief Narrative:   Andrea Larson is an 72 y.o. female past medical history significant for COPD hyperlipidemia alcohol abuse chronic kidney disease who presents to the emergency room complaining of diarrhea was recently discharged from the hospital and antibiotics, and the ED was found to have a creatinine of 1.2  Assessment/Plan:   Diarrhea: Appreciate GIs assistance. Currently on oral vancomycin continues to have more than 7 watery bowel movements a day no blood. Other management per GI. I will ask physical therapy to evaluate.  Basi Metabolic panel is pending Cont IV fluids as her fluid losses are more than she can keep up with orally.  Hyperlipidemia Continue statins.  Macrocytic anemia: Likely due to alcohol abuse  No signs of overt bleeding. Anemia panel showed a ferritin of 476 and saturation of  27.  Chronic kidney disease stage III: Creatinine seems to be at baseline.  Tobacco abuse/alcohol abuse: In remission.  Anxiety: Continue medications.  Mild COPD exacerbation: No wheezing on physical exam satting more than 90% on room air discontinue steroids.  DVT prophylaxis: Lovenox Family Communication:none Disposition Plan/Barrier to D/C: Unable to determine. Code Status:     Code Status Orders  (From admission, onward)         Start     Ordered   04/28/18 1630  Full code  Continuous     04/28/18 1629        Code Status History    Date Active Date Inactive Code Status Order ID Comments User Context   04/11/2018 2232 04/14/2018 1553 Full Code [id number]  Rise Patience, MD Inpatient        IV Access:    Peripheral IV   Procedures and diagnostic studies:   No results found.   Medical Consultants:    None.  Anti-Infectives:     Subjective:    Andrea Larson seems to have more  than 7 watery bowel movements a day.  Objective:    Vitals:   05/01/18 2035 05/01/18 2043 05/02/18 0504 05/02/18 0742  BP:  (!) 147/89 (!) 154/97   Pulse:  (!) 101 100   Resp:  17 14   Temp:  97.8 F (36.6 C) 97.6 F (36.4 C)   TempSrc:  Oral Oral   SpO2: 95% 98% 97% 95%    Intake/Output Summary (Last 24 hours) at 05/02/2018 0915 Last data filed at 05/01/2018 1200 Gross per 24 hour  Intake 0 ml  Output -  Net 0 ml   There were no vitals filed for this visit.  Exam: General exam: In no acute distress. Respiratory system: Good air movement and clear to auscultation. Cardiovascular system: S1 & S2 heard, RRR.  Gastrointestinal system: Abdomen is nondistended, soft and mild diffuse tenderness. Central nervous system: Alert and oriented. No focal neurological deficits. Extremities: No pedal edema. Skin: No rashes, lesions or ulcers Psychiatry: Judgement and insight appear normal. Mood & affect appropriate.    Data Reviewed:    Labs: Basic Metabolic Panel: Recent Labs  Lab 04/28/18 1328 04/29/18 0555  NA 138 136  K 4.3 4.8  CL 107 110  CO2 23 19*  GLUCOSE 84 120*  BUN 19 21  CREATININE 1.22* 1.18*  CALCIUM 8.3* 7.7*   GFR Estimated Creatinine Clearance: 32.6 mL/min (A) (by C-G formula based on SCr of 1.18 mg/dL (H)). Liver  Function Tests: Recent Labs  Lab 04/28/18 1328 04/29/18 0555  AST 16 12*  ALT 14 13  ALKPHOS 53 47  BILITOT 0.7 0.5  PROT 5.7* 5.4*  ALBUMIN 2.3* 2.1*   Recent Labs  Lab 04/28/18 1328  LIPASE 18   No results for input(s): AMMONIA in the last 168 hours. Coagulation profile No results for input(s): INR, PROTIME in the last 168 hours.  CBC: Recent Labs  Lab 04/28/18 1328 04/29/18 0555  WBC 7.5 5.3  NEUTROABS 5.3  --   HGB 10.1* 9.6*  HCT 32.9* 32.3*  MCV 100.9* 102.2*  PLT 345 328   Cardiac Enzymes: No results for input(s): CKTOTAL, CKMB, CKMBINDEX, TROPONINI in the last 168 hours. BNP (last 3 results) No results  for input(s): PROBNP in the last 8760 hours. CBG: No results for input(s): GLUCAP in the last 168 hours. D-Dimer: No results for input(s): DDIMER in the last 72 hours. Hgb A1c: No results for input(s): HGBA1C in the last 72 hours. Lipid Profile: No results for input(s): CHOL, HDL, LDLCALC, TRIG, CHOLHDL, LDLDIRECT in the last 72 hours. Thyroid function studies: No results for input(s): TSH, T4TOTAL, T3FREE, THYROIDAB in the last 72 hours.  Invalid input(s): FREET3 Anemia work up: Recent Labs    04/29/18 0946  VITAMINB12 1,502*  FOLATE 8.1  FERRITIN 476*  TIBC 149*  IRON 40   Sepsis Labs: Recent Labs  Lab 04/28/18 1328 04/29/18 0555  WBC 7.5 5.3   Microbiology Recent Results (from the past 240 hour(s))  Gastrointestinal Panel by PCR , Stool     Status: None   Collection Time: 04/29/18  9:31 AM  Result Value Ref Range Status   Campylobacter species NOT DETECTED NOT DETECTED Final   Plesimonas shigelloides NOT DETECTED NOT DETECTED Final   Salmonella species NOT DETECTED NOT DETECTED Final   Yersinia enterocolitica NOT DETECTED NOT DETECTED Final   Vibrio species NOT DETECTED NOT DETECTED Final   Vibrio cholerae NOT DETECTED NOT DETECTED Final   Enteroaggregative E coli (EAEC) NOT DETECTED NOT DETECTED Final   Enteropathogenic E coli (EPEC) NOT DETECTED NOT DETECTED Final   Enterotoxigenic E coli (ETEC) NOT DETECTED NOT DETECTED Final   Shiga like toxin producing E coli (STEC) NOT DETECTED NOT DETECTED Final   Shigella/Enteroinvasive E coli (EIEC) NOT DETECTED NOT DETECTED Final   Cryptosporidium NOT DETECTED NOT DETECTED Final   Cyclospora cayetanensis NOT DETECTED NOT DETECTED Final   Entamoeba histolytica NOT DETECTED NOT DETECTED Final   Giardia lamblia NOT DETECTED NOT DETECTED Final   Adenovirus F40/41 NOT DETECTED NOT DETECTED Final   Astrovirus NOT DETECTED NOT DETECTED Final   Norovirus GI/GII NOT DETECTED NOT DETECTED Final   Rotavirus A NOT DETECTED NOT  DETECTED Final   Sapovirus (I, II, IV, and V) NOT DETECTED NOT DETECTED Final    Comment: Performed at [facility], [address] [place], [place] [address]  C difficile quick scan w PCR reflex     Status: Abnormal   Collection Time: 04/30/18  8:43 AM  Result Value Ref Range Status   C Diff antigen POSITIVE (A) NEGATIVE Final   C Diff toxin NEGATIVE NEGATIVE Final   C Diff interpretation Results are indeterminate. See PCR results.  Final    Comment: Performed at [facility], [place] [address] [place], [place] [address]  C. Diff by PCR, Reflexed     Status: Abnormal   Collection Time: 04/30/18  8:43 AM  Result Value Ref Range Status  Toxigenic C. Difficile by PCR POSITIVE (A) NEGATIVE Final    Comment: Positive for toxigenic C. difficile with little to no toxin production. Only treat if clinical presentation suggests symptomatic illness. Performed at [facility], [place] [address] [place], [place] [address]      Medications:   . budesonide (PULMICORT) nebulizer solution  0.25 mg Nebulization BID  . buPROPion  150 mg Oral Daily  . enoxaparin (LOVENOX) injection  40 mg Subcutaneous Q24H  . ipratropium  0.5 mg Nebulization TID  . levalbuterol  0.63 mg Nebulization TID  . rosuvastatin  20 mg Oral Daily  . vancomycin  125 mg Oral QID   Continuous Infusions: . sodium chloride 75 mL/hr at 05/01/18 2326      LOS: 0 days   Charlynne Cousins  [facility] Pager [phone]  *Please refer to [email or web address], password [email or web address] to get updated schedule on who will round on this patient, as hospitalists switch teams weekly. If 7PM-7AM, please contact night-coverage at [email or web address], password [email or web address] for any overnight needs.  05/02/2018, 9:15 AM

## 2018-05-02 NOTE — Progress Notes (Signed)
     [facility] Progress Note   Chief Complaint:   diarrhea    SUBJECTIVE:    8-9 watery BMs yesterday. Diarrhea with episode of incontinence this am. Scared to eat  ASSESSMENT AND PLAN:   Acute diarrhea starting during a recent hospitalization. C-diff toxigenic PCR positive. On Vanco 125 mg QID. Still having several loose BMs.  -abdominal exam is benign, afebrile, normal WBC and non-tender belly. Continue Vanco, will add small amount of imodium  -bmet to check electrolytes, last check 3 days ago.     OBJECTIVE:     Vital signs in last 24 hours: Temp:  [97.6 F (36.4 C)-97.9 F (36.6 C)] 97.6 F (36.4 C) (10/25 0504) Pulse Rate:  [100-108] 100 (10/25 0504) Resp:  [14-18] 14 (10/25 0504) BP: (146-154)/(89-97) 154/97 (10/25 0504) SpO2:  [95 %-98 %] 95 % (10/25 0742) Last BM Date: 05/02/18 General:   Alert, well-developed white female in NAD EENT:  Normal hearing, non icteric sclera, conjunctive pink.  Heart:  Regular rate and rhythm; no murmurs. No lower extremity edema Pulm: Normal respiratory effort, lungs CTA bilaterally without wheezes or crackles. Abdomen:  Soft, nondistended, nontender.  Normal bowel sounds, no masses felt.     Neurologic:  Alert and  oriented x4;  grossly normal neurologically. Psych:  Pleasant, cooperative.  Normal mood and affect.   Intake/Output from previous day: 10/24 0701 - 10/25 0700 In: 240 [P.O.:240] Out: -  Intake/Output this shift: No intake/output data recorded.   Principal Problem:   Diarrhea Active Problems:   Hyperlipidemia   COPD (chronic obstructive pulmonary disease) (HCC)   COPD exacerbation (HCC)   Essential hypertension   CKD (chronic kidney disease), stage III ([place])     LOS: 0 days   Tye Savoy ,NP 05/02/2018, 9:06 AM

## 2018-05-03 DIAGNOSIS — A0472 Enterocolitis due to Clostridium difficile, not specified as recurrent: Secondary | ICD-10-CM

## 2018-05-03 MED ORDER — POTASSIUM CHLORIDE CRYS ER 20 MEQ PO TBCR
40.0000 meq | EXTENDED_RELEASE_TABLET | Freq: Two times a day (BID) | ORAL | Status: AC
  Administered 2018-05-03 (×2): 40 meq via ORAL
  Filled 2018-05-03 (×2): qty 2

## 2018-05-03 MED ORDER — IPRATROPIUM BROMIDE 0.02 % IN SOLN
0.5000 mg | Freq: Two times a day (BID) | RESPIRATORY_TRACT | Status: DC
  Administered 2018-05-03 – 2018-05-06 (×6): 0.5 mg via RESPIRATORY_TRACT
  Filled 2018-05-03 (×6): qty 2.5

## 2018-05-03 MED ORDER — LEVALBUTEROL HCL 0.63 MG/3ML IN NEBU
0.6300 mg | INHALATION_SOLUTION | Freq: Two times a day (BID) | RESPIRATORY_TRACT | Status: DC
  Administered 2018-05-03 – 2018-05-06 (×6): 0.63 mg via RESPIRATORY_TRACT
  Filled 2018-05-03 (×6): qty 3

## 2018-05-03 MED ORDER — SODIUM CHLORIDE 0.45 % IV SOLN
INTRAVENOUS | Status: DC
  Administered 2018-05-03 (×2): via INTRAVENOUS

## 2018-05-03 NOTE — Progress Notes (Signed)
PT Cancellation Note  Patient Details Name: Andrea Larson MRN: [id number] DOB: 1945/08/17   Cancelled Treatment:    Reason Eval/Treat Not Completed: PT screened, no needs identified, will sign off, RN reports that patient requires no assistance to ambulate  To BR.    Claretha Cooper 05/03/2018, 12:25 PM  Tresa Endo [place] Pager [phone] Office [phone]

## 2018-05-03 NOTE — Progress Notes (Signed)
   Patient Name: Andrea Larson Date of Encounter: 05/03/2018, 11:45 AM    Subjective  Still having diarrhea but less (5x in 24 hrs I think)   Objective  BP (!) 138/97 Comment: nurse notified  Pulse 91   Temp 98.6 F (37 C) (Oral)   Resp 20   SpO2 95%  Resting NAD    Assessment and Plan  C diff diarrhea - day 2 into 3 of vancomycin  Continue current tx  It takes 3-5 days to see sig improvement - no need for IV flagyl now Will follow-up by Monday  Gatha Mayer, MD, [facility] 05/03/2018 11:45 AM Pager [phone]

## 2018-05-03 NOTE — Progress Notes (Signed)
[facility] PROGRESS NOTE    Progress Note  Andrea Larson  HCW:[id number] DOB: 05/17/1946 DOA: 04/28/2018 PCP: Biagio Borg, MD     Brief Narrative:   Andrea Larson is an 72 y.o. female past medical history significant for COPD hyperlipidemia alcohol abuse chronic kidney disease who presents to the emergency room complaining of diarrhea was recently discharged from the hospital and antibiotics, and the ED was found to have a creatinine of 1.2  Assessment/Plan:   Diarrhea: Appreciate GIs assistance. On oral bank continues to have large amounts of watery bowel movements she has had more than 10.   Further management per GI.  Continue IV fluids we will change him to half-normal saline. Discussed with GI bulking agents will help.  Hyperlipidemia Continue statins.  Macrocytic anemia: Likely due to alcohol abuse  No signs of overt bleeding. Anemia panel showed a ferritin of 476 and saturation of  27.  Chronic kidney disease stage III: Creatinine seems to be at baseline.  Tobacco abuse/alcohol abuse: In remission.  Anxiety: Continue medications.  Mild COPD exacerbation: No wheezing on physical exam satting more than 90% on room air discontinue steroids.  DVT prophylaxis: Lovenox Family Communication:none Disposition Plan/Barrier to D/C: Unable to determine. Code Status:     Code Status Orders  (From admission, onward)         Start     Ordered   04/28/18 1630  Full code  Continuous     04/28/18 1629        Code Status History    Date Active Date Inactive Code Status Order ID Comments User Context   04/11/2018 2232 04/14/2018 1553 Full Code [id number]  Rise Patience, MD Inpatient        IV Access:    Peripheral IV   Procedures and diagnostic studies:   No results found.   Medical Consultants:    None.  Anti-Infectives:     Subjective:    Andrea Larson the last 24 hours she is had more than 10 watery bowel  movements.  Objective:    Vitals:   05/02/18 2016 05/02/18 2114 05/03/18 0812 05/03/18 0818  BP:  (!) 138/97    Pulse:  91    Resp:  20    Temp:  98.6 F (37 C)    TempSrc:  Oral    SpO2: 97% 95% 95% 95%    Intake/Output Summary (Last 24 hours) at 05/03/2018 0915 Last data filed at 05/03/2018 0600 Gross per 24 hour  Intake 1550 ml  Output -  Net 1550 ml   There were no vitals filed for this visit.  Exam: General exam: In no acute distress. Respiratory system: Good air movement and clear to auscultation. Cardiovascular system: S1 & S2 heard, RRR.  Gastrointestinal system: Abdomen is nondistended, soft and mild diffuse tenderness. Central nervous system: Alert and oriented. No focal neurological deficits. Extremities: No pedal edema. Skin: No rashes, lesions or ulcers Psychiatry: Judgement and insight appear normal. Mood & affect appropriate.    Data Reviewed:    Labs: Basic Metabolic Panel: Recent Labs  Lab 04/28/18 1328 04/29/18 0555 05/02/18 0938  NA 138 136 138  K 4.3 4.8 3.4*  CL 107 110 112*  CO2 23 19* 16*  GLUCOSE 84 120* 55*  BUN 19 21 10   CREATININE 1.22* 1.18* 1.13*  CALCIUM 8.3* 7.7* 7.3*   GFR Estimated Creatinine Clearance: 34 mL/min (A) (by C-G formula based on SCr of 1.13 mg/dL (H)). Liver Function  Tests: Recent Labs  Lab 04/28/18 1328 04/29/18 0555  AST 16 12*  ALT 14 13  ALKPHOS 53 47  BILITOT 0.7 0.5  PROT 5.7* 5.4*  ALBUMIN 2.3* 2.1*   Recent Labs  Lab 04/28/18 1328  LIPASE 18   No results for input(s): AMMONIA in the last 168 hours. Coagulation profile No results for input(s): INR, PROTIME in the last 168 hours.  CBC: Recent Labs  Lab 04/28/18 1328 04/29/18 0555  WBC 7.5 5.3  NEUTROABS 5.3  --   HGB 10.1* 9.6*  HCT 32.9* 32.3*  MCV 100.9* 102.2*  PLT 345 328   Cardiac Enzymes: No results for input(s): CKTOTAL, CKMB, CKMBINDEX, TROPONINI in the last 168 hours. BNP (last 3 results) No results for input(s):  PROBNP in the last 8760 hours. CBG: No results for input(s): GLUCAP in the last 168 hours. D-Dimer: No results for input(s): DDIMER in the last 72 hours. Hgb A1c: No results for input(s): HGBA1C in the last 72 hours. Lipid Profile: No results for input(s): CHOL, HDL, LDLCALC, TRIG, CHOLHDL, LDLDIRECT in the last 72 hours. Thyroid function studies: No results for input(s): TSH, T4TOTAL, T3FREE, THYROIDAB in the last 72 hours.  Invalid input(s): FREET3 Anemia work up: No results for input(s): VITAMINB12, FOLATE, FERRITIN, TIBC, IRON, RETICCTPCT in the last 72 hours. Sepsis Labs: Recent Labs  Lab 04/28/18 1328 04/29/18 0555  WBC 7.5 5.3   Microbiology Recent Results (from the past 240 hour(s))  Gastrointestinal Panel by PCR , Stool     Status: None   Collection Time: 04/29/18  9:31 AM  Result Value Ref Range Status   Campylobacter species NOT DETECTED NOT DETECTED Final   Plesimonas shigelloides NOT DETECTED NOT DETECTED Final   Salmonella species NOT DETECTED NOT DETECTED Final   Yersinia enterocolitica NOT DETECTED NOT DETECTED Final   Vibrio species NOT DETECTED NOT DETECTED Final   Vibrio cholerae NOT DETECTED NOT DETECTED Final   Enteroaggregative E coli (EAEC) NOT DETECTED NOT DETECTED Final   Enteropathogenic E coli (EPEC) NOT DETECTED NOT DETECTED Final   Enterotoxigenic E coli (ETEC) NOT DETECTED NOT DETECTED Final   Shiga like toxin producing E coli (STEC) NOT DETECTED NOT DETECTED Final   Shigella/Enteroinvasive E coli (EIEC) NOT DETECTED NOT DETECTED Final   Cryptosporidium NOT DETECTED NOT DETECTED Final   Cyclospora cayetanensis NOT DETECTED NOT DETECTED Final   Entamoeba histolytica NOT DETECTED NOT DETECTED Final   Giardia lamblia NOT DETECTED NOT DETECTED Final   Adenovirus F40/41 NOT DETECTED NOT DETECTED Final   Astrovirus NOT DETECTED NOT DETECTED Final   Norovirus GI/GII NOT DETECTED NOT DETECTED Final   Rotavirus A NOT DETECTED NOT DETECTED Final    Sapovirus (I, II, IV, and V) NOT DETECTED NOT DETECTED Final    Comment: Performed at [facility], [address] [place], [place] [address]  C difficile quick scan w PCR reflex     Status: Abnormal   Collection Time: 04/30/18  8:43 AM  Result Value Ref Range Status   C Diff antigen POSITIVE (A) NEGATIVE Final   C Diff toxin NEGATIVE NEGATIVE Final   C Diff interpretation Results are indeterminate. See PCR results.  Final    Comment: Performed at [facility], [place] [address], [place], [place] [address]  C. Diff by PCR, Reflexed     Status: Abnormal   Collection Time: 04/30/18  8:43 AM  Result Value Ref Range Status   Toxigenic C. Difficile by PCR POSITIVE (A) NEGATIVE  Final    Comment: Positive for toxigenic C. difficile with little to no toxin production. Only treat if clinical presentation suggests symptomatic illness. Performed at [facility], [place] [address] [place], [place] [address]      Medications:   . budesonide (PULMICORT) nebulizer solution  0.25 mg Nebulization BID  . buPROPion  150 mg Oral Daily  . enoxaparin (LOVENOX) injection  40 mg Subcutaneous Q24H  . ipratropium  0.5 mg Nebulization BID  . levalbuterol  0.63 mg Nebulization BID  . rosuvastatin  20 mg Oral Daily  . vancomycin  125 mg Oral QID   Continuous Infusions: . sodium chloride 75 mL/hr at 05/01/18 2326      LOS: 1 day   [facility] Pager [phone]  *Please refer to [email or web address], password [email or web address] to get updated schedule on who will round on this patient, as hospitalists switch teams weekly. If 7PM-7AM, please contact night-coverage at [email or web address], password [email or web address] for any overnight needs.  05/03/2018, 9:15 AM

## 2018-05-04 DIAGNOSIS — A0472 Enterocolitis due to Clostridium difficile, not specified as recurrent: Principal | ICD-10-CM

## 2018-05-04 LAB — BASIC METABOLIC PANEL
Anion gap: 8 (ref 5–15)
BUN: 7 mg/dL — ABNORMAL LOW (ref 8–23)
CO2: 18 mmol/L — ABNORMAL LOW (ref 22–32)
Calcium: 7.5 mg/dL — ABNORMAL LOW (ref 8.9–10.3)
Chloride: 114 mmol/L — ABNORMAL HIGH (ref 98–111)
Creatinine, Ser: 1.1 mg/dL — ABNORMAL HIGH (ref 0.44–1.00)
GFR calc Af Amer: 56 mL/min — ABNORMAL LOW (ref >60)
GFR calc non Af Amer: 48 mL/min — ABNORMAL LOW (ref >60)
Glucose, Bld: 73 mg/dL (ref 70–99)
Potassium: 4.2 mmol/L (ref 3.5–5.1)
Sodium: 140 mmol/L (ref 135–145)

## 2018-05-04 MED ORDER — LEVALBUTEROL HCL 0.63 MG/3ML IN NEBU
0.6300 mg | INHALATION_SOLUTION | Freq: Four times a day (QID) | RESPIRATORY_TRACT | Status: DC | PRN
  Administered 2018-05-04: 0.63 mg via RESPIRATORY_TRACT

## 2018-05-04 NOTE — Progress Notes (Signed)
[facility] PROGRESS NOTE    Progress Note  Andrea Larson  UUV:[id number] DOB: April 11, 1946 DOA: 04/28/2018 PCP: Biagio Borg, MD     Brief Narrative:   Andrea Larson is an 72 y.o. female past medical history significant for COPD hyperlipidemia alcohol abuse chronic kidney disease who presents to the emergency room complaining of diarrhea was recently discharged from the hospital and antibiotics, and the ED was found to have a creatinine of 1.2  Assessment/Plan:   Diarrhea: Appreciate GIs assistance. Cont ORal vanc.,  She relates her stools are more formed. Further management per GI.  KVO IV fluids  Hyperlipidemia Continue statins.  Macrocytic anemia: Likely due to alcohol abuse  No signs of overt bleeding. Anemia panel showed a ferritin of 476 and saturation of  27.  Chronic kidney disease stage III: Creatinine seems to be at baseline.  Tobacco abuse/alcohol abuse: In remission.  Anxiety: Continue medications.  Mild COPD exacerbation: Breathing is at baseline.  DVT prophylaxis: Lovenox Family Communication:none Disposition Plan/Barrier to D/C: Unable to determine. Code Status:     Code Status Orders  (From admission, onward)         Start     Ordered   04/28/18 1630  Full code  Continuous     04/28/18 1629        Code Status History    Date Active Date Inactive Code Status Order ID Comments User Context   04/11/2018 2232 04/14/2018 1553 Full Code [id number]  Rise Patience, MD Inpatient        IV Access:    Peripheral IV   Procedures and diagnostic studies:   No results found.   Medical Consultants:    None.  Anti-Infectives:     Subjective:    [name] on ly 5 BM and formed.  Objective:    Vitals:   05/03/18 1342 05/03/18 2006 05/03/18 2111 05/04/18 0418  BP: 126/87 128/89  (!) 147/95  Pulse: 87 89  94  Resp: 20 20  20   Temp: 99.5 F (37.5 C) 98 F (36.7 C)  98.2 F (36.8 C)  TempSrc:  Oral Oral  Oral  SpO2: 98% 94% 97% 92%    Intake/Output Summary (Last 24 hours) at 05/04/2018 0856 Last data filed at 05/04/2018 0500 Gross per 24 hour  Intake 1140 ml  Output 1 ml  Net 1139 ml   There were no vitals filed for this visit.  Exam: General exam: In no acute distress. Respiratory system: Good air movement and clear to auscultation. Cardiovascular system: S1 & S2 heard, RRR.  Gastrointestinal system: Abdomen is nondistended, soft and mild diffuse tenderness. Central nervous system: Alert and oriented. No focal neurological deficits. Extremities: No pedal edema. Skin: No rashes, lesions or ulcers   Data Reviewed:    Labs: Basic Metabolic Panel: Recent Labs  Lab 04/28/18 1328 04/29/18 0555 05/02/18 0938 05/04/18 0555  NA 138 136 138 140  K 4.3 4.8 3.4* 4.2  CL 107 110 112* 114*  CO2 23 19* 16* 18*  GLUCOSE 84 120* 55* 73  BUN 19 21 10  7*  CREATININE 1.22* 1.18* 1.13* 1.10*  CALCIUM 8.3* 7.7* 7.3* 7.5*   GFR Estimated Creatinine Clearance: 34.9 mL/min (A) (by C-G formula based on SCr of 1.1 mg/dL (H)). Liver Function Tests: Recent Labs  Lab 04/28/18 1328 04/29/18 0555  AST 16 12*  ALT 14 13  ALKPHOS 53 47  BILITOT 0.7 0.5  PROT 5.7* 5.4*  ALBUMIN 2.3* 2.1*  Recent Labs  Lab 04/28/18 1328  LIPASE 18   No results for input(s): AMMONIA in the last 168 hours. Coagulation profile No results for input(s): INR, PROTIME in the last 168 hours.  CBC: Recent Labs  Lab 04/28/18 1328 04/29/18 0555  WBC 7.5 5.3  NEUTROABS 5.3  --   HGB 10.1* 9.6*  HCT 32.9* 32.3*  MCV 100.9* 102.2*  PLT 345 328   Cardiac Enzymes: No results for input(s): CKTOTAL, CKMB, CKMBINDEX, TROPONINI in the last 168 hours. BNP (last 3 results) No results for input(s): PROBNP in the last 8760 hours. CBG: No results for input(s): GLUCAP in the last 168 hours. D-Dimer: No results for input(s): DDIMER in the last 72 hours. Hgb A1c: No results for input(s): HGBA1C in  the last 72 hours. Lipid Profile: No results for input(s): CHOL, HDL, LDLCALC, TRIG, CHOLHDL, LDLDIRECT in the last 72 hours. Thyroid function studies: No results for input(s): TSH, T4TOTAL, T3FREE, THYROIDAB in the last 72 hours.  Invalid input(s): FREET3 Anemia work up: No results for input(s): VITAMINB12, FOLATE, FERRITIN, TIBC, IRON, RETICCTPCT in the last 72 hours. Sepsis Labs: Recent Labs  Lab 04/28/18 1328 04/29/18 0555  WBC 7.5 5.3   Microbiology Recent Results (from the past 240 hour(s))  Gastrointestinal Panel by PCR , Stool     Status: None   Collection Time: 04/29/18  9:31 AM  Result Value Ref Range Status   Campylobacter species NOT DETECTED NOT DETECTED Final   Plesimonas shigelloides NOT DETECTED NOT DETECTED Final   Salmonella species NOT DETECTED NOT DETECTED Final   Yersinia enterocolitica NOT DETECTED NOT DETECTED Final   Vibrio species NOT DETECTED NOT DETECTED Final   Vibrio cholerae NOT DETECTED NOT DETECTED Final   Enteroaggregative E coli (EAEC) NOT DETECTED NOT DETECTED Final   Enteropathogenic E coli (EPEC) NOT DETECTED NOT DETECTED Final   Enterotoxigenic E coli (ETEC) NOT DETECTED NOT DETECTED Final   Shiga like toxin producing E coli (STEC) NOT DETECTED NOT DETECTED Final   Shigella/Enteroinvasive E coli (EIEC) NOT DETECTED NOT DETECTED Final   Cryptosporidium NOT DETECTED NOT DETECTED Final   Cyclospora cayetanensis NOT DETECTED NOT DETECTED Final   Entamoeba histolytica NOT DETECTED NOT DETECTED Final   Giardia lamblia NOT DETECTED NOT DETECTED Final   Adenovirus F40/41 NOT DETECTED NOT DETECTED Final   Astrovirus NOT DETECTED NOT DETECTED Final   Norovirus GI/GII NOT DETECTED NOT DETECTED Final   Rotavirus A NOT DETECTED NOT DETECTED Final   Sapovirus (I, II, IV, and V) NOT DETECTED NOT DETECTED Final    Comment: Performed at [facility], [address] [place], [place] [address]  C difficile quick scan w PCR reflex     Status:  Abnormal   Collection Time: 04/30/18  8:43 AM  Result Value Ref Range Status   C Diff antigen POSITIVE (A) NEGATIVE Final   C Diff toxin NEGATIVE NEGATIVE Final   C Diff interpretation Results are indeterminate. See PCR results.  Final    Comment: Performed at [facility], [place] [address], [place], [place] [address]  C. Diff by PCR, Reflexed     Status: Abnormal   Collection Time: 04/30/18  8:43 AM  Result Value Ref Range Status   Toxigenic C. Difficile by PCR POSITIVE (A) NEGATIVE Final    Comment: Positive for toxigenic C. difficile with little to no toxin production. Only treat if clinical presentation suggests symptomatic illness. Performed at [facility], [place],  [place] [address]      Medications:   . budesonide (PULMICORT) nebulizer solution  0.25 mg Nebulization BID  . buPROPion  150 mg Oral Daily  . enoxaparin (LOVENOX) injection  40 mg Subcutaneous Q24H  . ipratropium  0.5 mg Nebulization BID  . levalbuterol  0.63 mg Nebulization BID  . rosuvastatin  20 mg Oral Daily  . vancomycin  125 mg Oral QID   Continuous Infusions: . sodium chloride 75 mL/hr at 05/03/18 2258      LOS: 2 days   [facility] Pager [phone]  *Please refer to [email or web address], password [email or web address] to get updated schedule on who will round on this patient, as hospitalists switch teams weekly. If 7PM-7AM, please contact night-coverage at [email or web address], password [email or web address] for any overnight needs.  05/04/2018, 8:56 AM

## 2018-05-05 LAB — BASIC METABOLIC PANEL
Anion gap: 7 (ref 5–15)
BUN: <5 mg/dL — ABNORMAL LOW (ref 8–23)
CO2: 20 mmol/L — ABNORMAL LOW (ref 22–32)
Calcium: 7.7 mg/dL — ABNORMAL LOW (ref 8.9–10.3)
Chloride: 114 mmol/L — ABNORMAL HIGH (ref 98–111)
Creatinine, Ser: 1.05 mg/dL — ABNORMAL HIGH (ref 0.44–1.00)
GFR calc Af Amer: 59 mL/min — ABNORMAL LOW (ref >60)
GFR calc non Af Amer: 51 mL/min — ABNORMAL LOW (ref >60)
Glucose, Bld: 79 mg/dL (ref 70–99)
Potassium: 3.8 mmol/L (ref 3.5–5.1)
Sodium: 141 mmol/L (ref 135–145)

## 2018-05-05 MED ORDER — FIRST-VANCOMYCIN 50 MG/ML PO SOLN: 125.0000 mg | Freq: Four times a day (QID) | ORAL | 0 refills | Status: DC

## 2018-05-05 MED ORDER — LOPERAMIDE HCL 2 MG PO CAPS
2.0000 mg | ORAL_CAPSULE | Freq: Two times a day (BID) | ORAL | Status: DC
  Administered 2018-05-05 – 2018-05-06 (×3): 2 mg via ORAL
  Filled 2018-05-05 (×4): qty 1

## 2018-05-05 NOTE — Progress Notes (Signed)
Pharmacy - Brief Note  Vancomycin capsule cost  Per [facility],  Vancomycin caps will be ~$93 (patient in coverage gap) The liquid they have at [facility] is not covered.    [facility] pharmacy compounds vancomycin liquid but is not open on weekends.  May be option if remains hospitalized until next week.  Will follow-up if remains in hospital Monday.    ADDENDUM:  CALLED [facility], COST FOR VANCOMYCIN COMPOUNDED SOLUTION WILL BE $0.44.  - A prescription has been sent to [facility] outpatient pharmacy on 10/28 w/ a 5-day length of therapy.   Thanks,  Doreene Eland, PharmD, BCPS.   Work Cell: [phone] 05/05/2018 8:42 AM

## 2018-05-05 NOTE — Progress Notes (Signed)
[facility] PROGRESS NOTE    Progress Note  Andrea Larson  PIR:[id number] DOB: 1946-01-18 DOA: 04/28/2018 PCP: Biagio Borg, MD     Brief Narrative:   Andrea Larson is an 72 y.o. female past medical history significant for COPD hyperlipidemia alcohol abuse chronic kidney disease who presents to the emergency room complaining of diarrhea was recently discharged from the hospital and antibiotics, and the ED was found to have a creatinine of 1.2  Assessment/Plan:   Diarrhea: Appreciate GIs assistance. She is currently on oral vancomycin. She relates she had 5 bowel movements yesterday all of them more watery except for her last one which was more formed. Oral vancomycin is $0.44 at the [facility]. KVO IV fluids  Hyperlipidemia Continue statins.  Macrocytic anemia: Likely due to alcohol abuse  No signs of overt bleeding. Anemia panel showed a ferritin of 476 and saturation of  27.  Chronic kidney disease stage III: Creatinine seems to be at baseline.  Tobacco abuse/alcohol abuse: In remission.  Anxiety: Continue medications.  Mild COPD exacerbation: Resolved.  DVT prophylaxis: Lovenox Family Communication:none Disposition Plan/Barrier to D/C: Unable to determine. Code Status:     Code Status Orders  (From admission, onward)         Start     Ordered   04/28/18 1630  Full code  Continuous     04/28/18 1629        Code Status History    Date Active Date Inactive Code Status Order ID Comments User Context   04/11/2018 2232 04/14/2018 1553 Full Code [id number]  Rise Patience, MD Inpatient        IV Access:    Peripheral IV   Procedures and diagnostic studies:   No results found.   Medical Consultants:    None.  Anti-Infectives:     Subjective:    [name] only 5 bowel movements in the last 24 hours, the last one was formed all others were watery.  Objective:    Vitals:   05/04/18 2002  05/04/18 2059 05/05/18 0606 05/05/18 0828  BP:  (!) 152/89 (!) 151/89   Pulse:  85 89   Resp:  20 20   Temp:  98.7 F (37.1 C) 97.7 F (36.5 C)   TempSrc:   Oral   SpO2: 98% 96% 98% 95%    Intake/Output Summary (Last 24 hours) at 05/05/2018 0919 Last data filed at 05/04/2018 1100 Gross per 24 hour  Intake 240 ml  Output -  Net 240 ml   There were no vitals filed for this visit.  Exam: General exam: In no acute distress. Respiratory system: Good air movement and clear to auscultation. Cardiovascular system: S1 & S2 heard, RRR.  Gastrointestinal system: Abdomen is nondistended, soft and mild diffuse tenderness. Central nervous system: Alert and oriented. No focal neurological deficits. Extremities: No pedal edema. Skin: No rashes, lesions or ulcers Psychiatry: Judgement and insight appear normal. Mood & affect appropriate.    Data Reviewed:    Labs: Basic Metabolic Panel: Recent Labs  Lab 04/28/18 1328 04/29/18 0555 05/02/18 0938 05/04/18 0555 05/05/18 0657  NA 138 136 138 140 141  K 4.3 4.8 3.4* 4.2 3.8  CL 107 110 112* 114* 114*  CO2 23 19* 16* 18* 20*  GLUCOSE 84 120* 55* 73 79  BUN 19 21 10  7* <5*  CREATININE 1.22* 1.18* 1.13* 1.10* 1.05*  CALCIUM 8.3* 7.7* 7.3* 7.5* 7.7*   GFR Estimated Creatinine Clearance: 36.6 mL/min (  A) (by C-G formula based on SCr of 1.05 mg/dL (H)). Liver Function Tests: Recent Labs  Lab 04/28/18 1328 04/29/18 0555  AST 16 12*  ALT 14 13  ALKPHOS 53 47  BILITOT 0.7 0.5  PROT 5.7* 5.4*  ALBUMIN 2.3* 2.1*   Recent Labs  Lab 04/28/18 1328  LIPASE 18   No results for input(s): AMMONIA in the last 168 hours. Coagulation profile No results for input(s): INR, PROTIME in the last 168 hours.  CBC: Recent Labs  Lab 04/28/18 1328 04/29/18 0555  WBC 7.5 5.3  NEUTROABS 5.3  --   HGB 10.1* 9.6*  HCT 32.9* 32.3*  MCV 100.9* 102.2*  PLT 345 328   Cardiac Enzymes: No results for input(s): CKTOTAL, CKMB, CKMBINDEX,  TROPONINI in the last 168 hours. BNP (last 3 results) No results for input(s): PROBNP in the last 8760 hours. CBG: No results for input(s): GLUCAP in the last 168 hours. D-Dimer: No results for input(s): DDIMER in the last 72 hours. Hgb A1c: No results for input(s): HGBA1C in the last 72 hours. Lipid Profile: No results for input(s): CHOL, HDL, LDLCALC, TRIG, CHOLHDL, LDLDIRECT in the last 72 hours. Thyroid function studies: No results for input(s): TSH, T4TOTAL, T3FREE, THYROIDAB in the last 72 hours.  Invalid input(s): FREET3 Anemia work up: No results for input(s): VITAMINB12, FOLATE, FERRITIN, TIBC, IRON, RETICCTPCT in the last 72 hours. Sepsis Labs: Recent Labs  Lab 04/28/18 1328 04/29/18 0555  WBC 7.5 5.3   Microbiology Recent Results (from the past 240 hour(s))  Gastrointestinal Panel by PCR , Stool     Status: None   Collection Time: 04/29/18  9:31 AM  Result Value Ref Range Status   Campylobacter species NOT DETECTED NOT DETECTED Final   Plesimonas shigelloides NOT DETECTED NOT DETECTED Final   Salmonella species NOT DETECTED NOT DETECTED Final   Yersinia enterocolitica NOT DETECTED NOT DETECTED Final   Vibrio species NOT DETECTED NOT DETECTED Final   Vibrio cholerae NOT DETECTED NOT DETECTED Final   Enteroaggregative E coli (EAEC) NOT DETECTED NOT DETECTED Final   Enteropathogenic E coli (EPEC) NOT DETECTED NOT DETECTED Final   Enterotoxigenic E coli (ETEC) NOT DETECTED NOT DETECTED Final   Shiga like toxin producing E coli (STEC) NOT DETECTED NOT DETECTED Final   Shigella/Enteroinvasive E coli (EIEC) NOT DETECTED NOT DETECTED Final   Cryptosporidium NOT DETECTED NOT DETECTED Final   Cyclospora cayetanensis NOT DETECTED NOT DETECTED Final   Entamoeba histolytica NOT DETECTED NOT DETECTED Final   Giardia lamblia NOT DETECTED NOT DETECTED Final   Adenovirus F40/41 NOT DETECTED NOT DETECTED Final   Astrovirus NOT DETECTED NOT DETECTED Final   Norovirus GI/GII NOT  DETECTED NOT DETECTED Final   Rotavirus A NOT DETECTED NOT DETECTED Final   Sapovirus (I, II, IV, and V) NOT DETECTED NOT DETECTED Final    Comment: Performed at [facility], [address] [place], [place] [address]  C difficile quick scan w PCR reflex     Status: Abnormal   Collection Time: 04/30/18  8:43 AM  Result Value Ref Range Status   C Diff antigen POSITIVE (A) NEGATIVE Final   C Diff toxin NEGATIVE NEGATIVE Final   C Diff interpretation Results are indeterminate. See PCR results.  Final    Comment: Performed at [facility], [place] [address] [place], [place] [address]  C. Diff by PCR, Reflexed     Status: Abnormal   Collection Time: 04/30/18  8:43 AM  Result Value  Ref Range Status   Toxigenic C. Difficile by PCR POSITIVE (A) NEGATIVE Final    Comment: Positive for toxigenic C. difficile with little to no toxin production. Only treat if clinical presentation suggests symptomatic illness. Performed at [facility], [place] [address], [place], [place] [address]      Medications:   . budesonide (PULMICORT) nebulizer solution  0.25 mg Nebulization BID  . buPROPion  150 mg Oral Daily  . enoxaparin (LOVENOX) injection  40 mg Subcutaneous Q24H  . ipratropium  0.5 mg Nebulization BID  . levalbuterol  0.63 mg Nebulization BID  . rosuvastatin  20 mg Oral Daily  . vancomycin  125 mg Oral QID   Continuous Infusions:     LOS: 3 days   Charlynne Cousins  [facility] Pager [phone]  *Please refer to [email or web address], password [email or web address] to get updated schedule on who will round on this patient, as hospitalists switch teams weekly. If 7PM-7AM, please contact night-coverage at [email or web address], password [email or web address] for any overnight needs.  05/05/2018, 9:19 AM

## 2018-05-05 NOTE — Progress Notes (Signed)
     [facility] Progress Note   Chief Complaint:  C-diff diarrhea    SUBJECTIVE:    Had approx 9 BMs yesterday, already 3 this am. Stool this am slightly firmer. No abdominal pain. No nausea   ASSESSMENT AND PLAN:   C-diff diarrhea, today will be 5th day on oral Vanco 125mg  Q6.  -still having several BMs a day, already 3 this am but starting to firm. Abdominal exam is benign, she is afebrile, normal WBC. I think giving Imodium BID right now is fine (ordered prn but hasn't been taking it)  Macrocytic anemia, Hgb 9.6, down from baseline of 12. -No overt bleeding. Possibly multifactorial (blood draws, ETOH, renal disease).  -b12, folate normal.   Colon polyps, surveillance colonoscopy due 04/2020  CKD, stable.   OBJECTIVE:     Vital signs in last 24 hours: Temp:  [97.7 F (36.5 C)-98.7 F (37.1 C)] 97.7 F (36.5 C) (10/28 0606) Pulse Rate:  [85-96] 89 (10/28 0606) Resp:  [16-20] 20 (10/28 0606) BP: (116-152)/(84-110) 151/89 (10/28 0606) SpO2:  [95 %-98 %] 95 % (10/28 0828) Last BM Date: 05/04/18 General:   Alert, well-developed white female in NAD EENT:  Normal hearing, non icteric sclera, conjunctive pink.  Heart:  Regular rate and rhythm, no lower extremity edema Pulm: Normal respiratory effort  Abdomen:  Soft, nondistended, nontender.  Normal bowel sounds, no masses felt.     Neurologic:  Alert and  oriented x4;  grossly normal neurologically. Psych:  Pleasant, cooperative.  Normal mood and affect.   Intake/Output from previous day: 10/27 0701 - 10/28 0700 In: 240 [P.O.:240] Out: -  Intake/Output this shift: No intake/output data recorded.  Lab Results: No results for input(s): WBC, HGB, HCT, PLT in the last 72 hours. BMET Recent Labs    05/02/18 0938 05/04/18 0555 05/05/18 0657  NA 138 140 141  K 3.4* 4.2 3.8  CL 112* 114* 114*  CO2 16* 18* 20*  GLUCOSE 55* 73 79  BUN 10 7* <5*  CREATININE 1.13* 1.10* 1.05*  CALCIUM 7.3* 7.5* 7.7*      Principal Problem:   Diarrhea Active Problems:   Hyperlipidemia   COPD (chronic obstructive pulmonary disease) (HCC)   COPD exacerbation (HCC)   Essential hypertension   CKD (chronic kidney disease), stage III (HCC)   Acute diarrhea   Clostridium difficile diarrhea     LOS: 3 days   Andrea Larson ,NP 05/05/2018, 9:10 AM

## 2018-05-06 DIAGNOSIS — A0472 Enterocolitis due to Clostridium difficile, not specified as recurrent: Secondary | ICD-10-CM

## 2018-05-06 LAB — BASIC METABOLIC PANEL
Anion gap: 7 (ref 5–15)
BUN: 5 mg/dL — ABNORMAL LOW (ref 8–23)
CO2: 23 mmol/L (ref 22–32)
Calcium: 7.9 mg/dL — ABNORMAL LOW (ref 8.9–10.3)
Chloride: 111 mmol/L (ref 98–111)
Creatinine, Ser: 1.08 mg/dL — ABNORMAL HIGH (ref 0.44–1.00)
GFR calc Af Amer: 57 mL/min — ABNORMAL LOW (ref >60)
GFR calc non Af Amer: 49 mL/min — ABNORMAL LOW (ref >60)
Glucose, Bld: 87 mg/dL (ref 70–99)
Potassium: 3.4 mmol/L — ABNORMAL LOW (ref 3.5–5.1)
Sodium: 141 mmol/L (ref 135–145)

## 2018-05-06 MED ORDER — POTASSIUM CHLORIDE CRYS ER 20 MEQ PO TBCR
40.0000 meq | EXTENDED_RELEASE_TABLET | Freq: Two times a day (BID) | ORAL | Status: DC
  Administered 2018-05-06: 40 meq via ORAL
  Filled 2018-05-06: qty 2

## 2018-05-06 MED ORDER — VANCOMYCIN 50 MG/ML ORAL SOLUTION: 125.0000 mg | Freq: Four times a day (QID) | ORAL | 0 refills | Status: AC

## 2018-05-06 MED FILL — VANCOMYCIN 50MG/ML ORAL SOL: 50MG/1ML | 10 days supply | Qty: 100 | Fill #0

## 2018-05-06 NOTE — Care Management Note (Signed)
Case Management Note  Patient Details  Name: LAYLONI FAHRNER MRN: [id number] Date of Birth: 1946/03/06  Subjective/Objective:                  Discharged to home with self care and family support Action/Plan: Discharge to home with self-care, orders checked for hhc needs. No CM needs present at time of discharge.  Expected Discharge Date:  05/06/18               Expected Discharge Plan:  Home/Self Care  In-House Referral:     Discharge planning Services  CM Consult  Post Acute Care Choice:    Choice offered to:     DME Arranged:    DME Agency:     HH Arranged:    HH Agency:     Status of Service:  Completed, signed off  If discussed at [employer] of Stay Meetings, dates discussed:    Additional Comments:  Leeroy Cha, RN 05/06/2018, 11:00 AM

## 2018-05-06 NOTE — Discharge Summary (Addendum)
Physician Discharge Summary  Andrea Larson NFA:[id number] DOB: 22-May-1946 DOA: 04/28/2018  PCP: Biagio Borg, MD  Admit date: 04/28/2018 Discharge date: 05/06/2018  Admitted From: home Disposition:  Home  Recommendations for Outpatient Follow-up:  1. Follow up with PCP in 1-2 weeks   Home Health:No Equipment/Devices:noone  Discharge Condition:stable CODE STATUS:full Diet recommendation: Heart Healthy    Brief/Interim Summary:  72 y.o. female past medical history significant for COPD hyperlipidemia alcohol abuse chronic kidney disease who presents to the emergency room complaining of diarrhea was recently discharged from the hospital and antibiotics, and the ED was found to have a creatinine of 1.2  Discharge Diagnoses:  Principal Problem:   Diarrhea Active Problems:   Hyperlipidemia   COPD (chronic obstructive pulmonary disease) (HCC)   COPD exacerbation (HCC)   Essential hypertension   CKD (chronic kidney disease), stage III (HCC)   Acute diarrhea   Clostridium difficile diarrhea   C. difficile colitis  C. difficile colitis: She was having severe diarrhea, She had no leukocytosis GI was consulted as her C. difficile was inconclusive. After her diarrhea did not improve with regular antibiotics. She was started on oral vancomycin and her diarrhea improved she will continue oral vancomycin as an outpatient for 8 additional days.  Hyper lipidemia: Continue statins.  Microcytic anemia: Likely due to alcohol abuse no signs of overt bleeding. Follow-up with PCP as an outpatient.  Chronic kidney disease stage III: Creatinine remained at baseline.  Next  Tobacco abuse: Counseling.  Anxiety: Continue medication.  Mild COPD exacerbation: Has resolved she has finished her steroid treatment.  Discharge Instructions  Discharge Instructions    Diet - low sodium heart healthy   Complete by:  As directed    Increase activity slowly   Complete by:  As directed       Allergies as of 05/06/2018      Reactions   Lipitor [atorvastatin] Other (See Comments)   Myalgias      Medication List    STOP taking these medications   potassium chloride 10 MEQ tablet Commonly known as:  K-DUR     TAKE these medications   ALPRAZolam 0.25 MG tablet Commonly known as:  XANAX TAKE (1) TABLET TWICE A DAY AS NEEDED. What changed:  See the new instructions.   buPROPion 150 MG 24 hr tablet Commonly known as:  WELLBUTRIN XL Take 1 tablet (150 mg total) by mouth daily.   diphenoxylate-atropine 2.5-0.025 MG tablet Commonly known as:  LOMOTIL Take 1 tablet by mouth 4 (four) times daily as needed for diarrhea or loose stools.   feeding supplement (ENSURE ENLIVE) Liqd Take 237 mLs by mouth 3 (three) times daily between meals. What changed:  when to take this   FLUoxetine 40 MG capsule Commonly known as:  PROZAC TAKE (1) CAPSULE DAILY.   FLUTTER Devi Use as directed   IMODIUM A-D 2 MG tablet Generic drug:  loperamide Take 2 mg by mouth 4 (four) times daily as needed for diarrhea or loose stools.   levalbuterol 0.63 MG/3ML nebulizer solution Commonly known as:  XOPENEX Take 3 mLs (0.63 mg total) by nebulization every 6 (six) hours as needed for wheezing or shortness of breath. Dx:J44.9   levalbuterol 45 MCG/ACT inhaler Commonly known as:  XOPENEX HFA Inhale 2 puffs into the lungs every 6 (six) hours as needed for wheezing.   rosuvastatin 20 MG tablet Commonly known as:  CRESTOR TAKE 1 TABLET ONCE DAILY.   SPIRIVA RESPIMAT 2.5 MCG/ACT Aers Generic drug:  Tiotropium Bromide Monohydrate USE 1 PUFF DAILY AS DIRECTED What changed:  See the new instructions.   SYMBICORT 160-4.5 MCG/ACT inhaler Generic drug:  budesonide-formoterol USE 2 PUFFS TWICE DAILY. What changed:  See the new instructions.   vancomycin 50 mg/mL  oral solution Commonly known as:  VANCOCIN Take 2.5 mLs (125 mg total) by mouth 4 (four) times daily for 10 days.        Allergies  Allergen Reactions  . Lipitor [Atorvastatin] Other (See Comments)    Myalgias    Consultations:  Gastroenterology.   Procedures/Studies: Dg Chest 2 View  Result Date: 04/28/2018 CLINICAL DATA:  Shortness of breath. EXAM: CHEST - 2 VIEW COMPARISON:  Chest CT dated 08/13/2016 and chest x-ray dated 06/17/2016 FINDINGS: The heart size and pulmonary vascularity are normal. Lungs are clear. No effusions. Aortic atherosclerosis. Old compression fractures in the midthoracic spine. No acute bone abnormality. IMPRESSION: No acute abnormalities. Aortic Atherosclerosis (ICD10-I70.0). Electronically Signed   By: Lorriane Shire M.D.   On: 04/28/2018 13:49   Dg Wrist 2 Views Right  Result Date: 04/11/2018 CLINICAL DATA:  Pain and swelling EXAM: RIGHT WRIST - 2 VIEW COMPARISON:  06/19/2006 FINDINGS: No acute displaced fracture or malalignment. Moderate arthritis at the first [facility] and STT joints. Mild cartilaginous calcification. Os or old fracture adjacent to the ulnar styloid. Moderate arthritis at the first MCP joint. IMPRESSION: 1. No acute osseous abnormality. 2. Marked arthritis of the first [facility] joint and STT joints. 3. Chondrocalcinosis Electronically Signed   By: Donavan Foil M.D.   On: 04/11/2018 21:50   Mr Wrist Right Wo Contrast  Result Date: 04/12/2018 CLINICAL DATA:  Right wrist pain for 2-3 days.  No known injury. EXAM: MR OF THE RIGHT WRIST WITHOUT CONTRAST TECHNIQUE: Multiplanar, multisequence MR imaging of the right wrist was performed. No intravenous contrast was administered. COMPARISON:  Plain films of the right wrist 04/11/2018. FINDINGS: Only a coronal T2 weighted and T1 weighted sequence are provided. The patient was unable to tolerate further scanning. Ligaments: The scapholunate ligament appears torn. Lunotriquetral ligament appears intact. Triangular fibrocartilage: The disc is severely thinned and degenerated without discrete tear identified. Tendons: Evaluation is  limited but no tear is seen. Carpal tunnel/median nerve: Limited evaluation without tear seen. Guyon's canal: Cannot be assessed. Joint/cartilage: Advanced degenerative disease is present at the scaphoid trapezium trapezoid joint and first [place] joint. Bones/carpal alignment: No fracture or focal lesion is identified. Small degenerative cysts in the distal ulna are noted. Small accessory ossicle off the ulnar styloid is also seen. Other: There is a small distal radioulnar joint effusion. Effusion is also seen at the scaphoid trapezium trapezoid joint. IMPRESSION: Severely limited study demonstrating no acute abnormality. 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 Markedly degenerated triangular fibrocartilage without definite tear. Electronically Signed   By: Inge Rise M.D.   On: 04/12/2018 09:30    Subjective: No complains  Discharge Exam: Vitals:   05/06/18 0359 05/06/18 0849  BP: (!) 159/99   Pulse: 89 95  Resp: 20 18  Temp: 97.6 F (36.4 C)   SpO2: 98% 94%   Vitals:   05/05/18 1949 05/05/18 2001 05/06/18 0359 05/06/18 0849  BP: 135/77  (!) 159/99   Pulse: 89  89 95  Resp: 20  20 18   Temp: 98 F (36.7 C)  97.6 F (36.4 C)   TempSrc: Oral  Oral   SpO2: 97% 97% 98% 94%    General: Pt is alert, awake,  not in acute distress Cardiovascular: RRR, S1/S2 +, no rubs, no gallops Respiratory: CTA bilaterally, no wheezing, no rhonchi Abdominal: Soft, NT, ND, bowel sounds + Extremities: no edema, no cyanosis    The results of significant diagnostics from this hospitalization (including imaging, microbiology, ancillary and laboratory) are listed below for reference.     Microbiology: Recent Results (from the past 240 hour(s))  Gastrointestinal Panel by PCR , Stool     Status: None   Collection Time: 04/29/18  9:31 AM  Result Value Ref Range Status   Campylobacter species NOT DETECTED NOT DETECTED Final   Plesimonas shigelloides NOT  DETECTED NOT DETECTED Final   Salmonella species NOT DETECTED NOT DETECTED Final   Yersinia enterocolitica NOT DETECTED NOT DETECTED Final   Vibrio species NOT DETECTED NOT DETECTED Final   Vibrio cholerae NOT DETECTED NOT DETECTED Final   Enteroaggregative E coli (EAEC) NOT DETECTED NOT DETECTED Final   Enteropathogenic E coli (EPEC) NOT DETECTED NOT DETECTED Final   Enterotoxigenic E coli (ETEC) NOT DETECTED NOT DETECTED Final   Shiga like toxin producing E coli (STEC) NOT DETECTED NOT DETECTED Final   Shigella/Enteroinvasive E coli (EIEC) NOT DETECTED NOT DETECTED Final   Cryptosporidium NOT DETECTED NOT DETECTED Final   Cyclospora cayetanensis NOT DETECTED NOT DETECTED Final   Entamoeba histolytica NOT DETECTED NOT DETECTED Final   Giardia lamblia NOT DETECTED NOT DETECTED Final   Adenovirus F40/41 NOT DETECTED NOT DETECTED Final   Astrovirus NOT DETECTED NOT DETECTED Final   Norovirus GI/GII NOT DETECTED NOT DETECTED Final   Rotavirus A NOT DETECTED NOT DETECTED Final   Sapovirus (I, II, IV, and V) NOT DETECTED NOT DETECTED Final    Comment: Performed at [facility], [address] [place], [place] [address]  C difficile quick scan w PCR reflex     Status: Abnormal   Collection Time: 04/30/18  8:43 AM  Result Value Ref Range Status   C Diff antigen POSITIVE (A) NEGATIVE Final   C Diff toxin NEGATIVE NEGATIVE Final   C Diff interpretation Results are indeterminate. See PCR results.  Final    Comment: Performed at [facility], [place] [address], [place], [place] [address]  C. Diff by PCR, Reflexed     Status: Abnormal   Collection Time: 04/30/18  8:43 AM  Result Value Ref Range Status   Toxigenic C. Difficile by PCR POSITIVE (A) NEGATIVE Final    Comment: Positive for toxigenic C. difficile with little to no toxin production. Only treat if clinical presentation suggests symptomatic illness. Performed at [facility], [place] [address]  [place], [place] [address]      Labs: BNP (last 3 results) No results for input(s): BNP in the last 8760 hours. Basic Metabolic Panel: Recent Labs  Lab 05/02/18 0938 05/04/18 0555 05/05/18 0657 05/06/18 0648  NA 138 140 141 141  K 3.4* 4.2 3.8 3.4*  CL 112* 114* 114* 111  CO2 16* 18* 20* 23  GLUCOSE 55* 73 79 87  BUN 10 7* <5* 5*  CREATININE 1.13* 1.10* 1.05* 1.08*  CALCIUM 7.3* 7.5* 7.7* 7.9*   Liver Function Tests: No results for input(s): AST, ALT, ALKPHOS, BILITOT, PROT, ALBUMIN in the last 168 hours. No results for input(s): LIPASE, AMYLASE in the last 168 hours. No results for input(s): AMMONIA in the last 168 hours. CBC: No results for input(s): WBC, NEUTROABS, HGB, HCT, MCV, PLT in the last 168 hours. Cardiac Enzymes: No results for input(s): CKTOTAL,  CKMB, CKMBINDEX, TROPONINI in the last 168 hours. BNP: Invalid input(s): POCBNP CBG: No results for input(s): GLUCAP in the last 168 hours. D-Dimer No results for input(s): DDIMER in the last 72 hours. Hgb A1c No results for input(s): HGBA1C in the last 72 hours. Lipid Profile No results for input(s): CHOL, HDL, LDLCALC, TRIG, CHOLHDL, LDLDIRECT in the last 72 hours. Thyroid function studies No results for input(s): TSH, T4TOTAL, T3FREE, THYROIDAB in the last 72 hours.  Invalid input(s): FREET3 Anemia work up No results for input(s): VITAMINB12, FOLATE, FERRITIN, TIBC, IRON, RETICCTPCT in the last 72 hours. Urinalysis    Component Value Date/Time   COLORURINE YELLOW 03/21/2018 1428   APPEARANCEUR Sl Cloudy (A) 03/21/2018 1428   LABSPEC >=1.030 (A) 03/21/2018 1428   PHURINE 6.0 03/21/2018 1428   GLUCOSEU NEGATIVE 03/21/2018 1428   HGBUR LARGE (A) 03/21/2018 1428   HGBUR negative 06/20/2010 1406   BILIRUBINUR MODERATE (A) 03/21/2018 1428   KETONESUR 15 (A) 03/21/2018 1428   PROTEINUR 30 (A) 06/18/2016 0048   UROBILINOGEN 0.2 03/21/2018 1428   NITRITE NEGATIVE 03/21/2018 1428   LEUKOCYTESUR NEGATIVE  03/21/2018 1428   Sepsis Labs Invalid input(s): PROCALCITONIN,  WBC,  LACTICIDVEN Microbiology Recent Results (from the past 240 hour(s))  Gastrointestinal Panel by PCR , Stool     Status: None   Collection Time: 04/29/18  9:31 AM  Result Value Ref Range Status   Campylobacter species NOT DETECTED NOT DETECTED Final   Plesimonas shigelloides NOT DETECTED NOT DETECTED Final   Salmonella species NOT DETECTED NOT DETECTED Final   Yersinia enterocolitica NOT DETECTED NOT DETECTED Final   Vibrio species NOT DETECTED NOT DETECTED Final   Vibrio cholerae NOT DETECTED NOT DETECTED Final   Enteroaggregative E coli (EAEC) NOT DETECTED NOT DETECTED Final   Enteropathogenic E coli (EPEC) NOT DETECTED NOT DETECTED Final   Enterotoxigenic E coli (ETEC) NOT DETECTED NOT DETECTED Final   Shiga like toxin producing E coli (STEC) NOT DETECTED NOT DETECTED Final   Shigella/Enteroinvasive E coli (EIEC) NOT DETECTED NOT DETECTED Final   Cryptosporidium NOT DETECTED NOT DETECTED Final   Cyclospora cayetanensis NOT DETECTED NOT DETECTED Final   Entamoeba histolytica NOT DETECTED NOT DETECTED Final   Giardia lamblia NOT DETECTED NOT DETECTED Final   Adenovirus F40/41 NOT DETECTED NOT DETECTED Final   Astrovirus NOT DETECTED NOT DETECTED Final   Norovirus GI/GII NOT DETECTED NOT DETECTED Final   Rotavirus A NOT DETECTED NOT DETECTED Final   Sapovirus (I, II, IV, and V) NOT DETECTED NOT DETECTED Final    Comment: Performed at [facility], [address] [place], [place] [address]  C difficile quick scan w PCR reflex     Status: Abnormal   Collection Time: 04/30/18  8:43 AM  Result Value Ref Range Status   C Diff antigen POSITIVE (A) NEGATIVE Final   C Diff toxin NEGATIVE NEGATIVE Final   C Diff interpretation Results are indeterminate. See PCR results.  Final    Comment: Performed at [facility], [place] [address], [place], [place] [address]  C. Diff by PCR, Reflexed      Status: Abnormal   Collection Time: 04/30/18  8:43 AM  Result Value Ref Range Status   Toxigenic C. Difficile by PCR POSITIVE (A) NEGATIVE Final    Comment: Positive for toxigenic C. difficile with little to no toxin production. Only treat if clinical presentation suggests symptomatic illness. Performed at [facility], [place] [address], [place], [place] [address]  Time coordinating discharge: 35 minutes  SIGNED:   Charlynne Cousins, MD  [facility] 05/06/2018, 1:09 PM Pager   If 7PM-7AM, please contact night-coverage [email or web address] Password [email or web address]

## 2018-05-06 NOTE — Care Management Important Message (Signed)
Important Message  Patient Details  Name: Andrea Larson MRN: [id number] Date of Birth: 02-02-1946   Medicare Important Message Given:  Yes    [name] 05/06/2018, 11:17 AMImportant Message  Patient Details  Name: Andrea Larson MRN: [id number] Date of Birth: 04/17/1946   Medicare Important Message Given:  Yes    [name] 05/06/2018, 11:17 AM

## 2018-05-07 ENCOUNTER — Telehealth: Payer: Self-pay | Admitting: Internal Medicine

## 2018-05-07 ENCOUNTER — Telehealth: Payer: Self-pay | Admitting: *Deleted

## 2018-05-07 NOTE — Telephone Encounter (Signed)
[name] called regarding pt switching PCPs.  [name] stated pt is looking possibly for a female PCP since Dr. Quay Burow is not accepting transfers.  Information was relayed to [name] that there are other [facility] offices with MDs that are accepting new patients/transfers, with [facility] being one of them.  Pt  is currently scheduled to see Dr. Jenny Reichmann  for a [facility] f/u on 11/13 and [name] was informed that if pt wishes to transfer care after that visit, a telephone note can be sent btwn providers requesting a transfer at that time.

## 2018-05-07 NOTE — Telephone Encounter (Signed)
Transition Care Management Follow-up Telephone Call   Date discharged? 05/06/18   How have you been since you were released from the hospital? Pt states she is ok...just tired    Do you understand why you were in the hospital? YES   Do you understand the discharge instructions? YES   Where were you discharged to? Home   Items Reviewed:  Medications reviewed: YES  Allergies reviewed: YES  Dietary changes reviewed: YES  Referrals reviewed: No referral needed   Functional Questionnaire:  Activities of Daily Living (ADLs):   She states she are independent in the following: ambulation, bathing and hygiene, feeding, continence, grooming, toileting and dressing  States she can get around.. doesn't require assistance    Any transportation issues/concerns?: NO   Any patient concerns? NO   Confirmed importance and date/time of follow-up visits scheduled YES, appt 05/21/18  Provider Appointment booked with Dr. Jenny Reichmann  Confirmed with patient if condition begins to worsen call PCP or go to the ER.  Patient was given the office number and encouraged to call back with question or concerns.  : YES

## 2018-05-13 ENCOUNTER — Ambulatory Visit: Payer: Medicare HMO | Admitting: Gastroenterology

## 2018-05-14 ENCOUNTER — Other Ambulatory Visit: Payer: Self-pay | Admitting: Pulmonary Disease

## 2018-05-14 ENCOUNTER — Telehealth: Payer: Self-pay | Admitting: Pulmonary Disease

## 2018-05-14 ENCOUNTER — Other Ambulatory Visit: Payer: Self-pay | Admitting: Internal Medicine

## 2018-05-14 NOTE — Telephone Encounter (Signed)
Called and spoke with patient she stated that she is wanting to go back on albuterol instead of xopenex.   Dr. Vaughan Browner please advise, thank you.

## 2018-05-14 NOTE — Telephone Encounter (Signed)
Done erx 

## 2018-05-15 MED ORDER — ALBUTEROL SULFATE HFA 108 (90 BASE) MCG/ACT IN AERS: 2.0000 | INHALATION_SPRAY | Freq: Four times a day (QID) | RESPIRATORY_TRACT | 6 refills | Status: DC | PRN

## 2018-05-15 NOTE — Telephone Encounter (Signed)
Ok to change inhaler

## 2018-05-15 NOTE — Telephone Encounter (Signed)
Called and spoke with patient regarding change of inhaler  From Xopenex to Albuterol inhaler today Placed order of RX Albuterol Inhaler to [facility] Nothing further needed.

## 2018-05-19 ENCOUNTER — Ambulatory Visit: Payer: Self-pay

## 2018-05-19 NOTE — Telephone Encounter (Signed)
Caregiver reports pt. Has had swelling to feet and ankles - ankles are red and warm to touch. Pt. Complains it hurts to walk. Pain not relieved with Tylenol. Rebecca(caregiver) reports her appetite has been poor yesterday and today. Refusing to get out of bed today. Instructed [name] to bring all her medications for her appointment 05/21/18. Instructed to call back if symptoms worsen before her appointment. Verbalizes understanding. Answer Assessment - Initial Assessment Questions 1. ONSET: "When did the swelling start?" (e.g., minutes, hours, days)     Started 1 week ago  2. LOCATION: "What part of the leg is swollen?"  "Are both legs swollen or just one leg?"     Both legs - feet and ankles 3. SEVERITY: "How bad is the swelling?" (e.g., localized; mild, moderate, severe)  - Localized - small area of swelling localized to one leg  - MILD pedal edema - swelling limited to foot and ankle, pitting edema < 1/4 inch (6 mm) deep, rest and elevation eliminate most or all swelling  - MODERATE edema - swelling of lower leg to knee, pitting edema > 1/4 inch (6 mm) deep, rest and elevation only partially reduce swelling  - SEVERE edema - swelling extends above knee, facial or hand swelling present      Mild 4. REDNESS: "Does the swelling look red or infected?"     Yes 5. PAIN: "Is the swelling painful to touch?" If so, ask: "How painful is it?"   (Scale 1-10; mild, moderate or severe)     Moderate 6. FEVER: "Do you have a fever?" If so, ask: "What is it, how was it measured, and when did it start?"      No 7. CAUSE: "What do you think is causing the leg swelling?"     Unsure 8. MEDICAL HISTORY: "Do you have a history of heart failure, kidney disease, liver failure, or cancer?"     Unsure 9. RECURRENT SYMPTOM: "Have you had leg swelling before?" If so, ask: "When was the last time?" "What happened that time?"     No 10. OTHER SYMPTOMS: "Do you have any other symptoms?" (e.g., chest pain, difficulty  breathing)       Uses inhaler, decreased appetite 11. PREGNANCY: "Is there any chance you are pregnant?" "When was your last menstrual period?"       n/a  Protocols used: LEG SWELLING AND EDEMA-A-AH

## 2018-05-20 ENCOUNTER — Other Ambulatory Visit: Payer: Self-pay | Admitting: Pulmonary Disease

## 2018-05-21 ENCOUNTER — Encounter: Payer: Self-pay | Admitting: Internal Medicine

## 2018-05-21 ENCOUNTER — Ambulatory Visit (INDEPENDENT_AMBULATORY_CARE_PROVIDER_SITE_OTHER): Payer: Medicare HMO | Admitting: Internal Medicine

## 2018-05-21 ENCOUNTER — Telehealth: Payer: Self-pay | Admitting: Internal Medicine

## 2018-05-21 VITALS — BP 118/76 | HR 100 | Temp 97.9°F | Ht 60.0 in | Wt 122.0 lb

## 2018-05-21 DIAGNOSIS — R609 Edema, unspecified: Secondary | ICD-10-CM | POA: Diagnosis not present

## 2018-05-21 DIAGNOSIS — A0472 Enterocolitis due to Clostridium difficile, not specified as recurrent: Secondary | ICD-10-CM | POA: Diagnosis not present

## 2018-05-21 DIAGNOSIS — R269 Unspecified abnormalities of gait and mobility: Secondary | ICD-10-CM | POA: Diagnosis not present

## 2018-05-21 DIAGNOSIS — J449 Chronic obstructive pulmonary disease, unspecified: Secondary | ICD-10-CM | POA: Diagnosis not present

## 2018-05-21 DIAGNOSIS — I10 Essential (primary) hypertension: Secondary | ICD-10-CM | POA: Diagnosis not present

## 2018-05-21 DIAGNOSIS — R739 Hyperglycemia, unspecified: Secondary | ICD-10-CM | POA: Diagnosis not present

## 2018-05-21 DIAGNOSIS — R600 Localized edema: Secondary | ICD-10-CM

## 2018-05-21 MED ORDER — FUROSEMIDE 40 MG PO TABS: 40.0000 mg | ORAL_TABLET | Freq: Every day | ORAL | 0 refills | Status: DC

## 2018-05-21 NOTE — Patient Instructions (Signed)
Please take all new medication as prescribed - the las 40 mg per day for 5 days only  You will be contacted regarding the referral for: [place] with RN and Physical Therapy  Please monitor your blood pressure, as it may return to being > 140/90 in a few weeks, and maybe we can consider a milder fluid pill such as Hydrochlorothiazide 12.5 gm per day  Please continue all other medications as before, and refills have been done if requested.  Please have the pharmacy call with any other refills you may need.  Please keep your appointments with your specialists as you may have planned  No further lab work needed today  Please return in 1 months, or sooner if needed

## 2018-05-21 NOTE — Telephone Encounter (Signed)
Pt would like to transfer to Dr. Volanda Napoleon from Dr. Jenny Reichmann  Please advise

## 2018-05-21 NOTE — Assessment & Plan Note (Addendum)
stable overall by history and exam, recent data reviewed with pt, and pt to continue medical treatment as before,  to f/u any worsening symptoms or concerns  Note:  Total time for pt hx, exam, review of record with pt in the room, determination of diagnoses and plan for further eval and tx is > 40 min, with over 50% spent in coordination and counseling of patient including the differential dx, tx, further evaluation and other management of hyperglycemia, HTN, c diff colitis, gait disorder and copd

## 2018-05-21 NOTE — Assessment & Plan Note (Signed)
[place] for [facility] with RN and pT

## 2018-05-21 NOTE — Progress Notes (Signed)
Subjective:    Patient ID: Andrea Larson, female    DOB: 09-May-1946, 72 y.o.   MRN: [id number]  HPI  72 y.o.femalerecently hospd 10/21-10/29 with past medical history significant for COPD hyperlipidemia alcohol abuse chronic kidney disease who presented to the emergency room complaining of diarrhea was recently discharged from the hospital and antibiotics, and the ED was found to have a creatinine of 1.2.  Diarrhea persisted, started oral vanc and improved, with plan to finish 8 further days oral vanc as outpt.  Renal fx remained stable.  Mild incidental copd exacerbation improved with steroid tx.  Urged to quit smoking.  Denies worsening depressive symptoms, suicidal ideation, or panic; has ongoing anxiety.   Asks for [facility] with RN, PT.   Past Medical History:  Diagnosis Date  . Allergic rhinitis   . Allergic rhinitis, cause unspecified 09/21/2011  . Aortic stenosis 08/23/2016  . Arthritis   . Asthma   . Asthma 09/21/2011  . Colon polyps   . COPD (chronic obstructive pulmonary disease) (HCC)    mild   . Coronary artery calcification 08/23/2016  . Hyperlipidemia   . UTI (lower urinary tract infection)    Past Surgical History:  Procedure Laterality Date  . DENTAL SURGERY    . pilonidial cyst    . TONSILLECTOMY      reports that she quit smoking about 3 years ago. Her smoking use included cigarettes. She has a 41.25 pack-year smoking history. She has never used smokeless tobacco. She reports that she drinks alcohol. She reports that she does not use drugs. family history includes Alcohol abuse in her other; Arthritis in her other; Atrial fibrillation in her brother; Heart disease in her other; Hypertension in her father and other; Stroke in her father and other. Allergies  Allergen Reactions  . Lipitor [Atorvastatin] Other (See Comments)    Myalgias   Current Outpatient Medications on File Prior to Visit  Medication Sig Dispense Refill  . albuterol (PROVENTIL HFA;VENTOLIN HFA) 108  (90 Base) MCG/ACT inhaler Inhale 2 puffs into the lungs every 6 (six) hours as needed for wheezing or shortness of breath. 1 Inhaler 6  . ALPRAZolam (XANAX) 0.25 MG tablet TAKE (1) TABLET TWICE A DAY AS NEEDED. 60 tablet 2  . buPROPion (WELLBUTRIN XL) 150 MG 24 hr tablet Take 1 tablet (150 mg total) by mouth daily. 90 tablet 3  . diphenoxylate-atropine (LOMOTIL) 2.5-0.025 MG tablet Take 1 tablet by mouth 4 (four) times daily as needed for diarrhea or loose stools. 40 tablet 0  . feeding supplement, ENSURE ENLIVE, (ENSURE ENLIVE) LIQD Take 237 mLs by mouth 3 (three) times daily between meals. (Patient taking differently: Take 237 mLs by mouth 2 (two) times daily between meals. ) 237 mL   . FLUoxetine (PROZAC) 40 MG capsule TAKE (1) CAPSULE DAILY. 90 capsule 3  . levalbuterol (XOPENEX HFA) 45 MCG/ACT inhaler Inhale 2 puffs into the lungs every 6 (six) hours as needed for wheezing. 1 Inhaler 12  . levalbuterol (XOPENEX) 0.63 MG/3ML nebulizer solution Take 3 mLs (0.63 mg total) by nebulization every 6 (six) hours as needed for wheezing or shortness of breath. Dx:J44.9 3 mL 2  . loperamide (IMODIUM A-D) 2 MG tablet Take 2 mg by mouth 4 (four) times daily as needed for diarrhea or loose stools.    Marland Kitchen Respiratory Therapy Supplies (FLUTTER) DEVI Use as directed 1 each 0  . rosuvastatin (CRESTOR) 20 MG tablet TAKE 1 TABLET ONCE DAILY. (Patient taking differently: Take 20 mg  by mouth daily. ) 90 tablet 3  . SPIRIVA RESPIMAT 2.5 MCG/ACT AERS USE 1 PUFF DAILY AS DIRECTED (Patient taking differently: Take 1 puff by mouth daily. ) 4 g 5  . SYMBICORT 160-4.5 MCG/ACT inhaler USE 2 PUFFS TWICE DAILY. 10.2 g 0   No current facility-administered medications on file prior to visit.    Review of Systems  Constitutional: Negative for other unusual diaphoresis or sweats HENT: Negative for ear discharge or swelling Eyes: Negative for other worsening visual disturbances Respiratory: Negative for stridor or other swelling    Gastrointestinal: Negative for worsening distension or other blood Genitourinary: Negative for retention or other urinary change Musculoskeletal: Negative for other MSK pain or swelling Skin: Negative for color change or other new lesions Neurological: Negative for worsening tremors and other numbness  Psychiatric/Behavioral: Negative for worsening agitation or other fatigue All other system neg per pt    Objective:   Physical Exam BP 118/76   Pulse 100   Temp 97.9 F (36.6 C) (Oral)   Ht 5' (1.524 m)   SpO2 93%   BMI 23.63 kg/m  VS noted,  Constitutional: Pt appears in NAD HENT: Head: NCAT.  Right Ear: External ear normal.  Left Ear: External ear normal.  Eyes: . Pupils are equal, round, and reactive to light. Conjunctivae and EOM are normal Nose: without d/c or deformity Neck: Neck supple. Gross normal ROM Cardiovascular: Normal rate and regular rhythm.   Pulmonary/Chest: Effort normal and breath sounds without rales or wheezing.  Abd:  Soft, NT, ND, + BS, no organomegaly Neurological: Pt is alert. At baseline orientation, motor grossly intact Skin: Skin is warm. No rashes, other new lesions, 1+ bilat LE edema to mid calf Psychiatric: Pt behavior is normal without agitation , fights off and on with her family with her, and who seem hesitant to accept rationale for treatment today for peripheral edema No other exam findings Lab Results  Component Value Date   WBC 5.3 04/29/2018   HGB 9.6 (L) 04/29/2018   HCT 32.3 (L) 04/29/2018   PLT 328 04/29/2018   GLUCOSE 87 05/06/2018   CHOL 205 (H) 02/18/2018   TRIG 80.0 02/18/2018   HDL 132.60 02/18/2018   LDLDIRECT 124.8 09/21/2011   LDLCALC 56 02/18/2018   ALT 13 04/29/2018   AST 12 (L) 04/29/2018   NA 141 05/06/2018   K 3.4 (L) 05/06/2018   CL 111 05/06/2018   CREATININE 1.08 (H) 05/06/2018   BUN 5 (L) 05/06/2018   CO2 23 05/06/2018   TSH 1.010 04/12/2018   HGBA1C 5.3 02/18/2018          Assessment & Plan:

## 2018-05-21 NOTE — Assessment & Plan Note (Signed)
stable overall by history and exam, recent data reviewed with pt, and pt to continue medical treatment as before,  to f/u any worsening symptoms or concerns  

## 2018-05-21 NOTE — Assessment & Plan Note (Signed)
Clinically resolved, stable overall by history and exam, recent data reviewed with pt, and pt to continue medical treatment as before,  to f/u any worsening symptoms or concerns

## 2018-05-21 NOTE — Telephone Encounter (Signed)
Ok with me 

## 2018-05-21 NOTE — Assessment & Plan Note (Signed)
New onset , suspect related to recent IVF as inpatient, for lasix 40 qd x 5 days then reassess any further edema, though I think she has some trace bilat venous insuficiency in past

## 2018-05-22 NOTE — Telephone Encounter (Signed)
I am ok with transfer, however please inform pt that this provider does not prescribe benzos or chronic opioids so that she may be aware in the event this applies to her.

## 2018-05-23 NOTE — Telephone Encounter (Signed)
[name] the caretaker made aware, she will discuss with Mrs. Owens Shark and call back

## 2018-05-29 DIAGNOSIS — N189 Chronic kidney disease, unspecified: Secondary | ICD-10-CM | POA: Diagnosis not present

## 2018-05-29 DIAGNOSIS — J441 Chronic obstructive pulmonary disease with (acute) exacerbation: Secondary | ICD-10-CM | POA: Diagnosis not present

## 2018-05-29 DIAGNOSIS — I2510 Atherosclerotic heart disease of native coronary artery without angina pectoris: Secondary | ICD-10-CM | POA: Diagnosis not present

## 2018-05-29 DIAGNOSIS — I350 Nonrheumatic aortic (valve) stenosis: Secondary | ICD-10-CM | POA: Diagnosis not present

## 2018-05-29 DIAGNOSIS — I129 Hypertensive chronic kidney disease with stage 1 through stage 4 chronic kidney disease, or unspecified chronic kidney disease: Secondary | ICD-10-CM | POA: Diagnosis not present

## 2018-05-29 DIAGNOSIS — R739 Hyperglycemia, unspecified: Secondary | ICD-10-CM | POA: Diagnosis not present

## 2018-05-30 DIAGNOSIS — N189 Chronic kidney disease, unspecified: Secondary | ICD-10-CM | POA: Diagnosis not present

## 2018-05-30 DIAGNOSIS — R739 Hyperglycemia, unspecified: Secondary | ICD-10-CM | POA: Diagnosis not present

## 2018-05-30 DIAGNOSIS — I350 Nonrheumatic aortic (valve) stenosis: Secondary | ICD-10-CM | POA: Diagnosis not present

## 2018-05-30 DIAGNOSIS — I2510 Atherosclerotic heart disease of native coronary artery without angina pectoris: Secondary | ICD-10-CM | POA: Diagnosis not present

## 2018-05-30 DIAGNOSIS — J441 Chronic obstructive pulmonary disease with (acute) exacerbation: Secondary | ICD-10-CM | POA: Diagnosis not present

## 2018-05-30 DIAGNOSIS — I129 Hypertensive chronic kidney disease with stage 1 through stage 4 chronic kidney disease, or unspecified chronic kidney disease: Secondary | ICD-10-CM | POA: Diagnosis not present

## 2018-06-03 DIAGNOSIS — R739 Hyperglycemia, unspecified: Secondary | ICD-10-CM | POA: Diagnosis not present

## 2018-06-03 DIAGNOSIS — I350 Nonrheumatic aortic (valve) stenosis: Secondary | ICD-10-CM | POA: Diagnosis not present

## 2018-06-03 DIAGNOSIS — J441 Chronic obstructive pulmonary disease with (acute) exacerbation: Secondary | ICD-10-CM | POA: Diagnosis not present

## 2018-06-03 DIAGNOSIS — I2510 Atherosclerotic heart disease of native coronary artery without angina pectoris: Secondary | ICD-10-CM | POA: Diagnosis not present

## 2018-06-03 DIAGNOSIS — I129 Hypertensive chronic kidney disease with stage 1 through stage 4 chronic kidney disease, or unspecified chronic kidney disease: Secondary | ICD-10-CM | POA: Diagnosis not present

## 2018-06-03 DIAGNOSIS — N189 Chronic kidney disease, unspecified: Secondary | ICD-10-CM | POA: Diagnosis not present

## 2018-06-04 ENCOUNTER — Encounter: Payer: Medicare HMO | Admitting: Family Medicine

## 2018-06-06 DIAGNOSIS — I350 Nonrheumatic aortic (valve) stenosis: Secondary | ICD-10-CM | POA: Diagnosis not present

## 2018-06-06 DIAGNOSIS — N189 Chronic kidney disease, unspecified: Secondary | ICD-10-CM | POA: Diagnosis not present

## 2018-06-06 DIAGNOSIS — R739 Hyperglycemia, unspecified: Secondary | ICD-10-CM | POA: Diagnosis not present

## 2018-06-06 DIAGNOSIS — J441 Chronic obstructive pulmonary disease with (acute) exacerbation: Secondary | ICD-10-CM | POA: Diagnosis not present

## 2018-06-06 DIAGNOSIS — I2510 Atherosclerotic heart disease of native coronary artery without angina pectoris: Secondary | ICD-10-CM | POA: Diagnosis not present

## 2018-06-06 DIAGNOSIS — I129 Hypertensive chronic kidney disease with stage 1 through stage 4 chronic kidney disease, or unspecified chronic kidney disease: Secondary | ICD-10-CM | POA: Diagnosis not present

## 2018-06-09 ENCOUNTER — Telehealth: Payer: Self-pay | Admitting: Family Medicine

## 2018-06-09 NOTE — Telephone Encounter (Unsigned)
Copied from [place] [id number]. Topic: Quick Communication - Home Health Verbal Orders >> Jun 09, 2018  3:31 PM Yvette Rack wrote: Caller/Agency: Dwyane Dee with [facility] Number: [phone] Requesting OT/PT/Skilled Nursing/Social Work: PT Frequency: 1 time a week for 1 week, 2  times a week for 2 weeks, 1 time a week for 4 weeks  *** Dwyane Dee also requests that a prescription for a bedside commode be sent to [facility] ***

## 2018-06-09 NOTE — Telephone Encounter (Unsigned)
Copied from [place] [id number]. Topic: Quick Communication - Home Health Verbal Orders >> Jun 09, 2018  3:31 PM Yvette Rack wrote: Caller/Agency: Dwyane Dee with [facility] Number: [phone] Requesting OT/PT/Skilled Nursing/Social Work: PT Frequency: 1 time a week for 1 week, 2  times a week for 2 weeks, 1 time a week for 4 weeks  Dwyane Dee also requests that a prescription for a bedside commode be sent to [place]

## 2018-06-10 DIAGNOSIS — N189 Chronic kidney disease, unspecified: Secondary | ICD-10-CM | POA: Diagnosis not present

## 2018-06-10 DIAGNOSIS — R739 Hyperglycemia, unspecified: Secondary | ICD-10-CM | POA: Diagnosis not present

## 2018-06-10 DIAGNOSIS — I129 Hypertensive chronic kidney disease with stage 1 through stage 4 chronic kidney disease, or unspecified chronic kidney disease: Secondary | ICD-10-CM | POA: Diagnosis not present

## 2018-06-10 DIAGNOSIS — I2510 Atherosclerotic heart disease of native coronary artery without angina pectoris: Secondary | ICD-10-CM | POA: Diagnosis not present

## 2018-06-10 DIAGNOSIS — J441 Chronic obstructive pulmonary disease with (acute) exacerbation: Secondary | ICD-10-CM | POA: Diagnosis not present

## 2018-06-10 DIAGNOSIS — I350 Nonrheumatic aortic (valve) stenosis: Secondary | ICD-10-CM | POA: Diagnosis not present

## 2018-06-10 NOTE — Telephone Encounter (Signed)
The patient sister [name] is calling back and states that the patient never went to transfer care with Dr. Volanda Napoleon and she is wanting to stay with Dr. Jenny Reichmann and not transfer. Please advise.

## 2018-06-10 NOTE — Telephone Encounter (Signed)
Advise if ok for orders

## 2018-06-11 NOTE — Telephone Encounter (Signed)
This is not my pt

## 2018-06-12 DIAGNOSIS — R739 Hyperglycemia, unspecified: Secondary | ICD-10-CM | POA: Diagnosis not present

## 2018-06-12 DIAGNOSIS — J441 Chronic obstructive pulmonary disease with (acute) exacerbation: Secondary | ICD-10-CM | POA: Diagnosis not present

## 2018-06-12 DIAGNOSIS — I350 Nonrheumatic aortic (valve) stenosis: Secondary | ICD-10-CM | POA: Diagnosis not present

## 2018-06-12 DIAGNOSIS — N189 Chronic kidney disease, unspecified: Secondary | ICD-10-CM | POA: Diagnosis not present

## 2018-06-12 DIAGNOSIS — I129 Hypertensive chronic kidney disease with stage 1 through stage 4 chronic kidney disease, or unspecified chronic kidney disease: Secondary | ICD-10-CM | POA: Diagnosis not present

## 2018-06-12 DIAGNOSIS — I2510 Atherosclerotic heart disease of native coronary artery without angina pectoris: Secondary | ICD-10-CM | POA: Diagnosis not present

## 2018-06-12 NOTE — Telephone Encounter (Signed)
Called left a message for Dwyane Dee with [facility] regarding request for verbal orders on pt, notified agency that Dr Volanda Napoleon is not her pcp

## 2018-06-17 NOTE — Telephone Encounter (Signed)
Nothing further needed 

## 2018-06-19 DIAGNOSIS — I350 Nonrheumatic aortic (valve) stenosis: Secondary | ICD-10-CM | POA: Diagnosis not present

## 2018-06-19 DIAGNOSIS — I129 Hypertensive chronic kidney disease with stage 1 through stage 4 chronic kidney disease, or unspecified chronic kidney disease: Secondary | ICD-10-CM | POA: Diagnosis not present

## 2018-06-19 DIAGNOSIS — R739 Hyperglycemia, unspecified: Secondary | ICD-10-CM | POA: Diagnosis not present

## 2018-06-19 DIAGNOSIS — J441 Chronic obstructive pulmonary disease with (acute) exacerbation: Secondary | ICD-10-CM | POA: Diagnosis not present

## 2018-06-19 DIAGNOSIS — I2510 Atherosclerotic heart disease of native coronary artery without angina pectoris: Secondary | ICD-10-CM | POA: Diagnosis not present

## 2018-06-19 DIAGNOSIS — N189 Chronic kidney disease, unspecified: Secondary | ICD-10-CM | POA: Diagnosis not present

## 2018-06-20 ENCOUNTER — Ambulatory Visit: Payer: Medicare HMO | Admitting: Internal Medicine

## 2018-06-24 ENCOUNTER — Encounter: Payer: Self-pay | Admitting: Internal Medicine

## 2018-06-24 ENCOUNTER — Ambulatory Visit (INDEPENDENT_AMBULATORY_CARE_PROVIDER_SITE_OTHER): Payer: Medicare HMO | Admitting: Internal Medicine

## 2018-06-24 VITALS — BP 114/68 | HR 100 | Temp 97.8°F | Ht 60.0 in | Wt 113.0 lb

## 2018-06-24 DIAGNOSIS — R29818 Other symptoms and signs involving the nervous system: Secondary | ICD-10-CM | POA: Diagnosis not present

## 2018-06-24 DIAGNOSIS — R269 Unspecified abnormalities of gait and mobility: Secondary | ICD-10-CM

## 2018-06-24 DIAGNOSIS — F0390 Unspecified dementia without behavioral disturbance: Secondary | ICD-10-CM | POA: Insufficient documentation

## 2018-06-24 DIAGNOSIS — R413 Other amnesia: Secondary | ICD-10-CM | POA: Diagnosis not present

## 2018-06-24 DIAGNOSIS — J449 Chronic obstructive pulmonary disease, unspecified: Secondary | ICD-10-CM

## 2018-06-24 DIAGNOSIS — R609 Edema, unspecified: Secondary | ICD-10-CM | POA: Diagnosis not present

## 2018-06-24 DIAGNOSIS — G3184 Mild cognitive impairment, so stated: Secondary | ICD-10-CM

## 2018-06-24 DIAGNOSIS — R634 Abnormal weight loss: Secondary | ICD-10-CM | POA: Insufficient documentation

## 2018-06-24 DIAGNOSIS — I10 Essential (primary) hypertension: Secondary | ICD-10-CM

## 2018-06-24 MED ORDER — DONEPEZIL HCL 5 MG PO TABS: 5.0000 mg | ORAL_TABLET | Freq: Every day | ORAL | 3 refills | Status: DC

## 2018-06-24 NOTE — Progress Notes (Signed)
Subjective:    Patient ID: Andrea Larson, female    DOB: 06/16/1946, 72 y.o.   MRN: [id number]  HPI  Here to f/u with partner; overall doing ok,  Pt denies chest pain, increasing sob or doe, wheezing, orthopnea, PND, increased LE swelling, palpitations, dizziness or syncope. Breathing improved greatly now that partner has been helping her with taking her meds well  Pt denies new neurological symptoms such as new headache, or facial or extremity weakness or numbness.  Pt denies polydipsia, polyuria, or low sugar episode.  Pt states overall good compliance with meds, mostly trying to follow appropriate diet, with wt overall stable,  but little exercise however.  Bilat leg edema has resolved.  Is finishing home PT likely soon and has much more stamina and stability.  Partner c/o worsening memory in last 2-3 months, ST mostly and can no longer manage taking her medications.  Has lost considerable weight.  For unclear reasons, despite ensure 1-2 cans per day BP Readings from Last 3 Encounters:  06/24/18 114/68  05/21/18 118/76  05/06/18 (!) 159/99   Wt Readings from Last 3 Encounters:  06/24/18 113 lb (51.3 kg)  05/21/18 122 lb (55.3 kg)  04/21/18 121 lb (54.9 kg)   Past Medical History:  Diagnosis Date  . Allergic rhinitis   . Allergic rhinitis, cause unspecified 09/21/2011  . Aortic stenosis 08/23/2016  . Arthritis   . Asthma   . Asthma 09/21/2011  . Colon polyps   . COPD (chronic obstructive pulmonary disease) (HCC)    mild   . Coronary artery calcification 08/23/2016  . Hyperlipidemia   . UTI (lower urinary tract infection)    Past Surgical History:  Procedure Laterality Date  . DENTAL SURGERY    . pilonidial cyst    . TONSILLECTOMY      reports that she quit smoking about 3 years ago. Her smoking use included cigarettes. She has a 41.25 pack-year smoking history. She has never used smokeless tobacco. She reports current alcohol use. She reports that she does not use  drugs. family history includes Alcohol abuse in an other family member; Arthritis in an other family member; Atrial fibrillation in her brother; Heart disease in an other family member; Hypertension in her father and another family member; Stroke in her father and another family member. Allergies  Allergen Reactions  . Lipitor [Atorvastatin] Other (See Comments)    Myalgias   Current Outpatient Medications on File Prior to Visit  Medication Sig Dispense Refill  . albuterol (PROVENTIL HFA;VENTOLIN HFA) 108 (90 Base) MCG/ACT inhaler Inhale 2 puffs into the lungs every 6 (six) hours as needed for wheezing or shortness of breath. 1 Inhaler 6  . ALPRAZolam (XANAX) 0.25 MG tablet TAKE (1) TABLET TWICE A DAY AS NEEDED. 60 tablet 2  . buPROPion (WELLBUTRIN XL) 150 MG 24 hr tablet Take 1 tablet (150 mg total) by mouth daily. 90 tablet 3  . diphenoxylate-atropine (LOMOTIL) 2.5-0.025 MG tablet Take 1 tablet by mouth 4 (four) times daily as needed for diarrhea or loose stools. 40 tablet 0  . feeding supplement, ENSURE ENLIVE, (ENSURE ENLIVE) LIQD Take 237 mLs by mouth 3 (three) times daily between meals. (Patient taking differently: Take 237 mLs by mouth 2 (two) times daily between meals. ) 237 mL   . FLUoxetine (PROZAC) 40 MG capsule TAKE (1) CAPSULE DAILY. 90 capsule 3  . levalbuterol (XOPENEX HFA) 45 MCG/ACT inhaler Inhale 2 puffs into the lungs every 6 (six) hours as needed for  wheezing. 1 Inhaler 12  . levalbuterol (XOPENEX) 0.63 MG/3ML nebulizer solution Take 3 mLs (0.63 mg total) by nebulization every 6 (six) hours as needed for wheezing or shortness of breath. Dx:J44.9 3 mL 2  . loperamide (IMODIUM A-D) 2 MG tablet Take 2 mg by mouth 4 (four) times daily as needed for diarrhea or loose stools.    Marland Kitchen Respiratory Therapy Supplies (FLUTTER) DEVI Use as directed 1 each 0  . rosuvastatin (CRESTOR) 20 MG tablet TAKE 1 TABLET ONCE DAILY. (Patient taking differently: Take 20 mg by mouth daily. ) 90 tablet 3   . SPIRIVA RESPIMAT 2.5 MCG/ACT AERS USE 1 PUFF DAILY AS DIRECTED (Patient taking differently: Take 1 puff by mouth daily. ) 4 g 5  . SYMBICORT 160-4.5 MCG/ACT inhaler USE 2 PUFFS TWICE DAILY. 10.2 g 0  . furosemide (LASIX) 40 MG tablet Take 1 tablet (40 mg total) by mouth daily for 5 days. 5 tablet 0   No current facility-administered medications on file prior to visit.    Review of Systems  Constitutional: Negative for other unusual diaphoresis or sweats HENT: Negative for ear discharge or swelling Eyes: Negative for other worsening visual disturbances Respiratory: Negative for stridor or other swelling  Gastrointestinal: Negative for worsening distension or other blood Genitourinary: Negative for retention or other urinary change Musculoskeletal: Negative for other MSK pain or swelling Skin: Negative for color change or other new lesions Neurological: Negative for worsening tremors and other numbness  Psychiatric/Behavioral: Negative for worsening agitation or other fatigue All other system neg per pt    Objective:   Physical Exam BP 114/68   Pulse 100   Temp 97.8 F (36.6 C) (Oral)   Ht 5' (1.524 m)   Wt 113 lb (51.3 kg)   SpO2 93%   BMI 22.07 kg/m  VS noted, not ill appearing Constitutional: Pt appears in NAD HENT: Head: NCAT.  Right Ear: External ear normal.  Left Ear: External ear normal.  Eyes: . Pupils are equal, round, and reactive to light. Conjunctivae and EOM are normal Nose: without d/c or deformity Neck: Neck supple. Gross normal ROM Cardiovascular: Normal rate and regular rhythm.   Pulmonary/Chest: Effort normal and breath sounds without rales or wheezing.  Abd:  Soft, NT, ND, + BS, no organomegaly Neurological: Pt is alert. At baseline orientation but with ST memory loss, motor grossly intact Skin: Skin is warm. No rashes, other new lesions, no LE edema Psychiatric: Pt behavior is normal without agitation  No other exam findings Lab Results  Component  Value Date   WBC 5.3 04/29/2018   HGB 9.6 (L) 04/29/2018   HCT 32.3 (L) 04/29/2018   PLT 328 04/29/2018   GLUCOSE 87 05/06/2018   CHOL 205 (H) 02/18/2018   TRIG 80.0 02/18/2018   HDL 132.60 02/18/2018   LDLDIRECT 124.8 09/21/2011   LDLCALC 56 02/18/2018   ALT 13 04/29/2018   AST 12 (L) 04/29/2018   NA 141 05/06/2018   K 3.4 (L) 05/06/2018   CL 111 05/06/2018   CREATININE 1.08 (H) 05/06/2018   BUN 5 (L) 05/06/2018   CO2 23 05/06/2018   TSH 1.010 04/12/2018   HGBA1C 5.3 02/18/2018         Assessment & Plan:

## 2018-06-24 NOTE — Assessment & Plan Note (Signed)
Improved, to finish home PT

## 2018-06-24 NOTE — Assessment & Plan Note (Signed)
stable overall by history and exam, recent data reviewed with pt, and pt to continue medical treatment as before,  to f/u any worsening symptoms or concerns  

## 2018-06-24 NOTE — Assessment & Plan Note (Signed)
Resolved with last tx, cont same

## 2018-06-24 NOTE — Assessment & Plan Note (Signed)
Recent worsening after long hx of ETOH use; for aricept 5 qd, MRI brain, and refer neurology per partner request

## 2018-06-24 NOTE — Assessment & Plan Note (Signed)
As above.

## 2018-06-24 NOTE — Assessment & Plan Note (Signed)
Etiology unclear, for ensure tid between meals  Note:  Total time for pt hx, exam, review of record with pt in the room, determination of diagnoses and plan for further eval and tx is > 40 min, with over 50% spent in coordination and counseling of patient including the differential dx, tx, further evaluation and other management of wt loss, memory loss, periph edema, HTN, gait disorder

## 2018-06-24 NOTE — Patient Instructions (Addendum)
Please take all new medication as prescribed - the aricept 5 mg per day  You will be contacted regarding the referral for: Neurology, and MRI   Be sure to take all medications as prescribed, as you are doing now  Please drink the Ensure at three cans per day in between meals  Please continue all other medications as before, and refills have been done if requested.  Please have the pharmacy call with any other refills you may need.  Please continue your efforts at being more active, low cholesterol diet  Please keep your appointments with your specialists as you may have planned  Please return in 6 months, or sooner if needed

## 2018-06-25 DIAGNOSIS — I2510 Atherosclerotic heart disease of native coronary artery without angina pectoris: Secondary | ICD-10-CM | POA: Diagnosis not present

## 2018-06-25 DIAGNOSIS — R739 Hyperglycemia, unspecified: Secondary | ICD-10-CM | POA: Diagnosis not present

## 2018-06-25 DIAGNOSIS — I129 Hypertensive chronic kidney disease with stage 1 through stage 4 chronic kidney disease, or unspecified chronic kidney disease: Secondary | ICD-10-CM | POA: Diagnosis not present

## 2018-06-25 DIAGNOSIS — I350 Nonrheumatic aortic (valve) stenosis: Secondary | ICD-10-CM | POA: Diagnosis not present

## 2018-06-25 DIAGNOSIS — J441 Chronic obstructive pulmonary disease with (acute) exacerbation: Secondary | ICD-10-CM | POA: Diagnosis not present

## 2018-06-25 DIAGNOSIS — N189 Chronic kidney disease, unspecified: Secondary | ICD-10-CM | POA: Diagnosis not present

## 2018-07-04 DIAGNOSIS — I2510 Atherosclerotic heart disease of native coronary artery without angina pectoris: Secondary | ICD-10-CM | POA: Diagnosis not present

## 2018-07-04 DIAGNOSIS — N189 Chronic kidney disease, unspecified: Secondary | ICD-10-CM | POA: Diagnosis not present

## 2018-07-04 DIAGNOSIS — R739 Hyperglycemia, unspecified: Secondary | ICD-10-CM | POA: Diagnosis not present

## 2018-07-04 DIAGNOSIS — J441 Chronic obstructive pulmonary disease with (acute) exacerbation: Secondary | ICD-10-CM | POA: Diagnosis not present

## 2018-07-04 DIAGNOSIS — I350 Nonrheumatic aortic (valve) stenosis: Secondary | ICD-10-CM | POA: Diagnosis not present

## 2018-07-04 DIAGNOSIS — I129 Hypertensive chronic kidney disease with stage 1 through stage 4 chronic kidney disease, or unspecified chronic kidney disease: Secondary | ICD-10-CM | POA: Diagnosis not present

## 2018-07-07 ENCOUNTER — Encounter: Payer: Self-pay | Admitting: Internal Medicine

## 2018-07-08 ENCOUNTER — Other Ambulatory Visit: Payer: Self-pay | Admitting: Pulmonary Disease

## 2018-07-08 DIAGNOSIS — I350 Nonrheumatic aortic (valve) stenosis: Secondary | ICD-10-CM | POA: Diagnosis not present

## 2018-07-08 DIAGNOSIS — J441 Chronic obstructive pulmonary disease with (acute) exacerbation: Secondary | ICD-10-CM | POA: Diagnosis not present

## 2018-07-08 DIAGNOSIS — N189 Chronic kidney disease, unspecified: Secondary | ICD-10-CM | POA: Diagnosis not present

## 2018-07-08 DIAGNOSIS — I129 Hypertensive chronic kidney disease with stage 1 through stage 4 chronic kidney disease, or unspecified chronic kidney disease: Secondary | ICD-10-CM | POA: Diagnosis not present

## 2018-07-08 DIAGNOSIS — I2510 Atherosclerotic heart disease of native coronary artery without angina pectoris: Secondary | ICD-10-CM | POA: Diagnosis not present

## 2018-07-08 DIAGNOSIS — R739 Hyperglycemia, unspecified: Secondary | ICD-10-CM | POA: Diagnosis not present

## 2018-07-25 ENCOUNTER — Other Ambulatory Visit: Payer: Self-pay | Admitting: Acute Care

## 2018-07-25 DIAGNOSIS — Z122 Encounter for screening for malignant neoplasm of respiratory organs: Secondary | ICD-10-CM

## 2018-07-25 DIAGNOSIS — Z87891 Personal history of nicotine dependence: Secondary | ICD-10-CM

## 2018-08-18 ENCOUNTER — Other Ambulatory Visit: Payer: Self-pay | Admitting: Pulmonary Disease

## 2018-08-21 ENCOUNTER — Other Ambulatory Visit: Payer: Self-pay | Admitting: Internal Medicine

## 2018-08-22 NOTE — Telephone Encounter (Signed)
Done erx 

## 2018-08-22 NOTE — Telephone Encounter (Signed)
Last OV 06/24/18  [place] Controlled Substance Database checked. Last filled on 07/10/18

## 2018-09-08 ENCOUNTER — Other Ambulatory Visit: Payer: Self-pay | Admitting: Internal Medicine

## 2018-09-23 ENCOUNTER — Other Ambulatory Visit: Payer: Self-pay | Admitting: Pulmonary Disease

## 2018-10-18 ENCOUNTER — Other Ambulatory Visit: Payer: Self-pay | Admitting: Pulmonary Disease

## 2018-10-20 ENCOUNTER — Telehealth: Payer: Self-pay | Admitting: Pulmonary Disease

## 2018-10-20 MED ORDER — LEVALBUTEROL HCL 0.63 MG/3ML IN NEBU: 0.6300 mg | INHALATION_SOLUTION | Freq: Four times a day (QID) | RESPIRATORY_TRACT | 2 refills | Status: DC | PRN

## 2018-10-20 NOTE — Telephone Encounter (Signed)
Spoke with the pt to verify the msg  Rx for levalbuterol neb sol sent to [facility]  Nothing further needed per pt

## 2018-11-15 ENCOUNTER — Other Ambulatory Visit: Payer: Self-pay | Admitting: Internal Medicine

## 2018-12-24 ENCOUNTER — Other Ambulatory Visit: Payer: Self-pay

## 2018-12-24 ENCOUNTER — Encounter: Payer: Self-pay | Admitting: Internal Medicine

## 2018-12-24 ENCOUNTER — Ambulatory Visit (INDEPENDENT_AMBULATORY_CARE_PROVIDER_SITE_OTHER): Payer: Medicare HMO | Admitting: Internal Medicine

## 2018-12-24 ENCOUNTER — Other Ambulatory Visit: Payer: Self-pay | Admitting: Internal Medicine

## 2018-12-24 ENCOUNTER — Other Ambulatory Visit (INDEPENDENT_AMBULATORY_CARE_PROVIDER_SITE_OTHER): Payer: Medicare HMO

## 2018-12-24 VITALS — BP 126/74 | HR 93 | Temp 97.6°F | Ht 60.0 in | Wt 123.0 lb

## 2018-12-24 DIAGNOSIS — E538 Deficiency of other specified B group vitamins: Secondary | ICD-10-CM

## 2018-12-24 DIAGNOSIS — E611 Iron deficiency: Secondary | ICD-10-CM | POA: Diagnosis not present

## 2018-12-24 DIAGNOSIS — N183 Chronic kidney disease, stage 3 unspecified: Secondary | ICD-10-CM

## 2018-12-24 DIAGNOSIS — E559 Vitamin D deficiency, unspecified: Secondary | ICD-10-CM

## 2018-12-24 DIAGNOSIS — R739 Hyperglycemia, unspecified: Secondary | ICD-10-CM | POA: Diagnosis not present

## 2018-12-24 DIAGNOSIS — Z0000 Encounter for general adult medical examination without abnormal findings: Secondary | ICD-10-CM

## 2018-12-24 DIAGNOSIS — J449 Chronic obstructive pulmonary disease, unspecified: Secondary | ICD-10-CM | POA: Diagnosis not present

## 2018-12-24 LAB — CBC WITH DIFFERENTIAL/PLATELET
Basophils Absolute: 0.1 10*3/uL (ref 0.0–0.1)
Basophils Relative: 0.8 % (ref 0.0–3.0)
Eosinophils Absolute: 0.1 10*3/uL (ref 0.0–0.7)
Eosinophils Relative: 1 % (ref 0.0–5.0)
HCT: 42 % (ref 36.0–46.0)
Hemoglobin: 14 g/dL (ref 12.0–15.0)
Lymphocytes Relative: 20.6 % (ref 12.0–46.0)
Lymphs Abs: 1.4 10*3/uL (ref 0.7–4.0)
MCHC: 33.4 g/dL (ref 30.0–36.0)
MCV: 97.3 fl (ref 78.0–100.0)
Monocytes Absolute: 0.6 10*3/uL (ref 0.1–1.0)
Monocytes Relative: 8.6 % (ref 3.0–12.0)
Neutro Abs: 4.8 10*3/uL (ref 1.4–7.7)
Neutrophils Relative %: 69 % (ref 43.0–77.0)
Platelets: 298 10*3/uL (ref 150.0–400.0)
RBC: 4.32 Mil/uL (ref 3.87–5.11)
RDW: 13 % (ref 11.5–15.5)
WBC: 7 10*3/uL (ref 4.0–10.5)

## 2018-12-24 LAB — VITAMIN B12: Vitamin B-12: 477 pg/mL (ref 211–911)

## 2018-12-24 LAB — URINALYSIS, ROUTINE W REFLEX MICROSCOPIC
Bilirubin Urine: NEGATIVE
Hgb urine dipstick: NEGATIVE
Ketones, ur: NEGATIVE
Leukocytes,Ua: NEGATIVE
Nitrite: NEGATIVE
RBC / HPF: NONE SEEN (ref 0–?)
Specific Gravity, Urine: 1.025 (ref 1.000–1.030)
Urine Glucose: NEGATIVE
Urobilinogen, UA: 0.2 (ref 0.0–1.0)
pH: 6 (ref 5.0–8.0)

## 2018-12-24 LAB — IBC PANEL
Iron: 59 ug/dL (ref 42–145)
Saturation Ratios: 15.6 % — ABNORMAL LOW (ref 20.0–50.0)
Transferrin: 271 mg/dL (ref 212.0–360.0)

## 2018-12-24 LAB — HEPATIC FUNCTION PANEL
ALT: 39 U/L — ABNORMAL HIGH (ref 0–35)
AST: 29 U/L (ref 0–37)
Albumin: 4.2 g/dL (ref 3.5–5.2)
Alkaline Phosphatase: 63 U/L (ref 39–117)
Bilirubin, Direct: 0.1 mg/dL (ref 0.0–0.3)
Total Bilirubin: 0.4 mg/dL (ref 0.2–1.2)
Total Protein: 6.9 g/dL (ref 6.0–8.3)

## 2018-12-24 LAB — BASIC METABOLIC PANEL
BUN: 24 mg/dL — ABNORMAL HIGH (ref 6–23)
CO2: 25 mEq/L (ref 19–32)
Calcium: 9 mg/dL (ref 8.4–10.5)
Chloride: 104 mEq/L (ref 96–112)
Creatinine, Ser: 1.05 mg/dL (ref 0.40–1.20)
GFR: 51.05 mL/min — ABNORMAL LOW (ref >60.00)
Glucose, Bld: 92 mg/dL (ref 70–99)
Potassium: 4.3 mEq/L (ref 3.5–5.1)
Sodium: 137 mEq/L (ref 135–145)

## 2018-12-24 LAB — LIPID PANEL
Cholesterol: 169 mg/dL (ref 0–200)
HDL: 78 mg/dL (ref >39.00)
LDL Cholesterol: 64 mg/dL (ref 0–99)
NonHDL: 90.91
Total CHOL/HDL Ratio: 2
Triglycerides: 134 mg/dL (ref 0.0–149.0)
VLDL: 26.8 mg/dL (ref 0.0–40.0)

## 2018-12-24 LAB — HEMOGLOBIN A1C: Hgb A1c MFr Bld: 5.5 % (ref 4.6–6.5)

## 2018-12-24 LAB — TSH: TSH: 1.61 u[IU]/mL (ref 0.35–4.50)

## 2018-12-24 LAB — VITAMIN D 25 HYDROXY (VIT D DEFICIENCY, FRACTURES): VITD: 25.83 ng/mL — ABNORMAL LOW (ref 30.00–100.00)

## 2018-12-24 MED ORDER — VITAMIN D (ERGOCALCIFEROL) 1.25 MG (50000 UNIT) PO CAPS: 50000.0000 [IU] | ORAL_CAPSULE | ORAL | 0 refills | Status: DC

## 2018-12-24 NOTE — Patient Instructions (Signed)

## 2018-12-24 NOTE — Assessment & Plan Note (Signed)
stable overall by history and exam, recent data reviewed with pt, and pt to continue medical treatment as before,  to f/u any worsening symptoms or concerns  

## 2018-12-24 NOTE — Progress Notes (Signed)
Subjective:    Patient ID: Andrea Larson, female    DOB: 04-30-1946, 73 y.o.   MRN: [id number]  HPI  Here for wellness and f/u;  Overall doing ok;  Pt denies Chest pain, worsening SOB, DOE, wheezing, orthopnea, PND, worsening LE edema, palpitations, dizziness or syncope.  Pt denies neurological change such as new headache, facial or extremity weakness.  Pt denies polydipsia, polyuria, or low sugar symptoms. Pt states overall good compliance with treatment and medications, good tolerability, and has been trying to follow appropriate diet.  Pt denies worsening depressive symptoms, suicidal ideation or panic. No fever, night sweats, wt loss, loss of appetite, or other constitutional symptoms.  Pt states good ability with ADL's, has low fall risk, home safety reviewed and adequate, no other significant changes in hearing or vision, and only occasionally active with exercise. Semi retired .  Appetite better, has gained several lbs.  No new complaints Wt Readings from Last 3 Encounters:  12/24/18 123 lb (55.8 kg)  06/24/18 113 lb (51.3 kg)  05/21/18 122 lb (55.3 kg)   Past Medical History:  Diagnosis Date  . Allergic rhinitis   . Allergic rhinitis, cause unspecified 09/21/2011  . Aortic stenosis 08/23/2016  . Arthritis   . Asthma   . Asthma 09/21/2011  . Colon polyps   . COPD (chronic obstructive pulmonary disease) (HCC)    mild   . Coronary artery calcification 08/23/2016  . Hyperlipidemia   . UTI (lower urinary tract infection)    Past Surgical History:  Procedure Laterality Date  . DENTAL SURGERY    . pilonidial cyst    . TONSILLECTOMY      reports that she quit smoking about 3 years ago. Her smoking use included cigarettes. She has a 41.25 pack-year smoking history. She has never used smokeless tobacco. She reports current alcohol use. She reports that she does not use drugs. family history includes Alcohol abuse in an other family member; Arthritis in an other family member; Atrial  fibrillation in her brother; Heart disease in an other family member; Hypertension in her father and another family member; Stroke in her father and another family member. Allergies  Allergen Reactions  . Lipitor [Atorvastatin] Other (See Comments)    Myalgias   Current Outpatient Medications on File Prior to Visit  Medication Sig Dispense Refill  . albuterol (PROVENTIL HFA;VENTOLIN HFA) 108 (90 Base) MCG/ACT inhaler Inhale 2 puffs into the lungs every 6 (six) hours as needed for wheezing or shortness of breath. 1 Inhaler 6  . ALPRAZolam (XANAX) 0.25 MG tablet TAKE (1) TABLET TWICE A DAY AS NEEDED. 60 tablet 2  . buPROPion (WELLBUTRIN XL) 150 MG 24 hr tablet Take 1 tablet (150 mg total) by mouth daily. 90 tablet 3  . diphenoxylate-atropine (LOMOTIL) 2.5-0.025 MG tablet Take 1 tablet by mouth 4 (four) times daily as needed for diarrhea or loose stools. 40 tablet 0  . donepezil (ARICEPT) 5 MG tablet Take 1 tablet (5 mg total) by mouth at bedtime. 90 tablet 3  . feeding supplement, ENSURE ENLIVE, (ENSURE ENLIVE) LIQD Take 237 mLs by mouth 3 (three) times daily between meals. (Patient taking differently: Take 237 mLs by mouth 2 (two) times daily between meals. ) 237 mL   . FLUoxetine (PROZAC) 40 MG capsule TAKE (1) CAPSULE DAILY. 90 capsule 0  . levalbuterol (XOPENEX HFA) 45 MCG/ACT inhaler Inhale 2 puffs into the lungs every 6 (six) hours as needed for wheezing. 1 Inhaler 12  . levalbuterol (XOPENEX)  0.63 MG/3ML nebulizer solution Take 3 mLs (0.63 mg total) by nebulization every 6 (six) hours as needed for wheezing or shortness of breath. 120 mL 2  . loperamide (IMODIUM A-D) 2 MG tablet Take 2 mg by mouth 4 (four) times daily as needed for diarrhea or loose stools.    . montelukast (SINGULAIR) 10 MG tablet TAKE 1 TABLET ONCE DAILY. 90 tablet 1  . Respiratory Therapy Supplies (FLUTTER) DEVI Use as directed 1 each 0  . rosuvastatin (CRESTOR) 20 MG tablet TAKE 1 TABLET ONCE DAILY. (Patient taking  differently: Take 20 mg by mouth daily. ) 90 tablet 3  . SPIRIVA RESPIMAT 2.5 MCG/ACT AERS USE 1 PUFF DAILY AS DIRECTED (Patient taking differently: Take 1 puff by mouth daily. ) 4 g 5  . SYMBICORT 160-4.5 MCG/ACT inhaler USE 2 PUFFS TWICE DAILY. 10.2 Inhaler 4  . furosemide (LASIX) 40 MG tablet Take 1 tablet (40 mg total) by mouth daily for 5 days. 5 tablet 0   No current facility-administered medications on file prior to visit.    Review of Systems Constitutional: Negative for other unusual diaphoresis, sweats, appetite or weight changes HENT: Negative for other worsening hearing loss, ear pain, facial swelling, mouth sores or neck stiffness.   Eyes: Negative for other worsening pain, redness or other visual disturbance.  Respiratory: Negative for other stridor or swelling Cardiovascular: Negative for other palpitations or other chest pain  Gastrointestinal: Negative for worsening diarrhea or loose stools, blood in stool, distention or other pain Genitourinary: Negative for hematuria, flank pain or other change in urine volume.  Musculoskeletal: Negative for myalgias or other joint swelling.  Skin: Negative for other color change, or other wound or worsening drainage.  Neurological: Negative for other syncope or numbness. Hematological: Negative for other adenopathy or swelling Psychiatric/Behavioral: Negative for hallucinations, other worsening agitation, SI, self-injury, or new decreased concentration All other system neg per pt    Objective:   Physical Exam BP 126/74   Pulse 93   Temp 97.6 F (36.4 C) (Oral)   Ht 5' (1.524 m)   Wt 123 lb (55.8 kg)   SpO2 100%   BMI 24.02 kg/m  VS noted,  Constitutional: Pt is oriented to person, place, and time. Appears well-developed and well-nourished, in no significant distress and comfortable Head: Normocephalic and atraumatic  Eyes: Conjunctivae and EOM are normal. Pupils are equal, round, and reactive to light Right Ear: External ear  normal without discharge Left Ear: External ear normal without discharge Nose: Nose without discharge or deformity Mouth/Throat: Oropharynx is without other ulcerations and moist  Neck: Normal range of motion. Neck supple. No JVD present. No tracheal deviation present or significant neck LA or mass Cardiovascular: Normal rate, regular rhythm, normal heart sounds and intact distal pulses.   Pulmonary/Chest: WOB normal and breath sounds without rales or wheezing  Abdominal: Soft. Bowel sounds are normal. NT. No HSM  Musculoskeletal: Normal range of motion. Exhibits no edema Lymphadenopathy: Has no other cervical adenopathy.  Neurological: Pt is alert and oriented to person, place, and time. Pt has normal reflexes. No cranial nerve deficit. Motor grossly intact, Gait intact Skin: Skin is warm and dry. No rash noted or new ulcerations Psychiatric:  Has normal mood and affect. Behavior is normal without agitation No other exam findings Lab Results  Component Value Date   WBC 5.3 04/29/2018   HGB 9.6 (L) 04/29/2018   HCT 32.3 (L) 04/29/2018   PLT 328 04/29/2018   GLUCOSE 87 05/06/2018  CHOL 205 (H) 02/18/2018   TRIG 80.0 02/18/2018   HDL 132.60 02/18/2018   LDLDIRECT 124.8 09/21/2011   LDLCALC 56 02/18/2018   ALT 13 04/29/2018   AST 12 (L) 04/29/2018   NA 141 05/06/2018   K 3.4 (L) 05/06/2018   CL 111 05/06/2018   CREATININE 1.08 (H) 05/06/2018   BUN 5 (L) 05/06/2018   CO2 23 05/06/2018   TSH 1.010 04/12/2018   HGBA1C 5.3 02/18/2018       Assessment & Plan:

## 2018-12-24 NOTE — Assessment & Plan Note (Signed)

## 2018-12-26 ENCOUNTER — Other Ambulatory Visit: Payer: Self-pay | Admitting: Internal Medicine

## 2018-12-29 ENCOUNTER — Other Ambulatory Visit: Payer: Self-pay | Admitting: Internal Medicine

## 2019-01-01 ENCOUNTER — Other Ambulatory Visit: Payer: Self-pay | Admitting: Internal Medicine

## 2019-01-02 NOTE — Telephone Encounter (Signed)
Done erx 

## 2019-02-04 ENCOUNTER — Other Ambulatory Visit: Payer: Self-pay | Admitting: Pulmonary Disease

## 2019-02-04 ENCOUNTER — Telehealth: Payer: Self-pay | Admitting: Pulmonary Disease

## 2019-02-04 NOTE — Telephone Encounter (Signed)
Refill request was received from [facility] today for albuterol/ventolin inhaler.  Refill was sent to requested pharmacy. Called and spoke with Patient.  Patient made aware refill request was sent today.  Understanding stated. Nothing further at this time.

## 2019-02-17 ENCOUNTER — Other Ambulatory Visit: Payer: Self-pay | Admitting: Internal Medicine

## 2019-03-01 ENCOUNTER — Other Ambulatory Visit: Payer: Self-pay | Admitting: Pulmonary Disease

## 2019-03-02 ENCOUNTER — Other Ambulatory Visit: Payer: Self-pay | Admitting: Internal Medicine

## 2019-03-03 NOTE — Telephone Encounter (Signed)
Done erx 

## 2019-03-25 ENCOUNTER — Other Ambulatory Visit: Payer: Self-pay | Admitting: Pulmonary Disease

## 2019-03-26 ENCOUNTER — Telehealth: Payer: Self-pay | Admitting: Pulmonary Disease

## 2019-03-26 NOTE — Telephone Encounter (Signed)
Called and spoke to pt. Pt states she has had a worsening in SOB over the last several months. Pt denies any know contact with someone with COVID. Pt denies CP/tightness, f/c/s, change in cough from baseline. Pt last seen in 03/2018. Offered appt with an APP, this was refused. Pt states she is ok waiting for an appt with Dr. Vaughan Browner next week. Appt made for 9/24. Pt verbalized understanding and denied any further questions or concerns at this time.

## 2019-03-27 ENCOUNTER — Emergency Department (HOSPITAL_COMMUNITY): Payer: Medicare HMO

## 2019-03-27 ENCOUNTER — Emergency Department (HOSPITAL_COMMUNITY)
Admission: EM | Admit: 2019-03-27 | Discharge: 2019-03-27 | Disposition: A | Discharge status: Home / Self Care | Payer: Medicare HMO | Attending: Emergency Medicine | Admitting: Emergency Medicine

## 2019-03-27 ENCOUNTER — Encounter (HOSPITAL_COMMUNITY): Payer: Self-pay

## 2019-03-27 ENCOUNTER — Other Ambulatory Visit: Payer: Self-pay

## 2019-03-27 DIAGNOSIS — R52 Pain, unspecified: Secondary | ICD-10-CM | POA: Diagnosis not present

## 2019-03-27 DIAGNOSIS — N183 Chronic kidney disease, stage 3 (moderate): Secondary | ICD-10-CM | POA: Insufficient documentation

## 2019-03-27 DIAGNOSIS — Y999 Unspecified external cause status: Secondary | ICD-10-CM | POA: Diagnosis not present

## 2019-03-27 DIAGNOSIS — S0990XA Unspecified injury of head, initial encounter: Secondary | ICD-10-CM | POA: Diagnosis not present

## 2019-03-27 DIAGNOSIS — S301XXA Contusion of abdominal wall, initial encounter: Secondary | ICD-10-CM | POA: Diagnosis not present

## 2019-03-27 DIAGNOSIS — I2510 Atherosclerotic heart disease of native coronary artery without angina pectoris: Secondary | ICD-10-CM | POA: Diagnosis not present

## 2019-03-27 DIAGNOSIS — Z79899 Other long term (current) drug therapy: Secondary | ICD-10-CM | POA: Diagnosis not present

## 2019-03-27 DIAGNOSIS — S3993XA Unspecified injury of pelvis, initial encounter: Secondary | ICD-10-CM | POA: Diagnosis not present

## 2019-03-27 DIAGNOSIS — S2232XA Fracture of one rib, left side, initial encounter for closed fracture: Secondary | ICD-10-CM

## 2019-03-27 DIAGNOSIS — S299XXA Unspecified injury of thorax, initial encounter: Secondary | ICD-10-CM | POA: Diagnosis not present

## 2019-03-27 DIAGNOSIS — J45909 Unspecified asthma, uncomplicated: Secondary | ICD-10-CM | POA: Insufficient documentation

## 2019-03-27 DIAGNOSIS — Y929 Unspecified place or not applicable: Secondary | ICD-10-CM | POA: Insufficient documentation

## 2019-03-27 DIAGNOSIS — Z87891 Personal history of nicotine dependence: Secondary | ICD-10-CM | POA: Diagnosis not present

## 2019-03-27 DIAGNOSIS — W0110XA Fall on same level from slipping, tripping and stumbling with subsequent striking against unspecified object, initial encounter: Secondary | ICD-10-CM | POA: Diagnosis not present

## 2019-03-27 DIAGNOSIS — W19XXXA Unspecified fall, initial encounter: Secondary | ICD-10-CM

## 2019-03-27 DIAGNOSIS — S3991XA Unspecified injury of abdomen, initial encounter: Secondary | ICD-10-CM | POA: Diagnosis not present

## 2019-03-27 DIAGNOSIS — Y939 Activity, unspecified: Secondary | ICD-10-CM | POA: Insufficient documentation

## 2019-03-27 DIAGNOSIS — I129 Hypertensive chronic kidney disease with stage 1 through stage 4 chronic kidney disease, or unspecified chronic kidney disease: Secondary | ICD-10-CM | POA: Insufficient documentation

## 2019-03-27 DIAGNOSIS — S199XXA Unspecified injury of neck, initial encounter: Secondary | ICD-10-CM | POA: Diagnosis not present

## 2019-03-27 DIAGNOSIS — I10 Essential (primary) hypertension: Secondary | ICD-10-CM | POA: Diagnosis not present

## 2019-03-27 DIAGNOSIS — S3092XA Unspecified superficial injury of abdominal wall, initial encounter: Secondary | ICD-10-CM | POA: Diagnosis present

## 2019-03-27 DIAGNOSIS — M5489 Other dorsalgia: Secondary | ICD-10-CM | POA: Diagnosis not present

## 2019-03-27 LAB — BASIC METABOLIC PANEL
Anion gap: 10 (ref 5–15)
BUN: 15 mg/dL (ref 8–23)
CO2: 25 mmol/L (ref 22–32)
Calcium: 9 mg/dL (ref 8.9–10.3)
Chloride: 102 mmol/L (ref 98–111)
Creatinine, Ser: 1.22 mg/dL — ABNORMAL HIGH (ref 0.44–1.00)
GFR calc Af Amer: 50 mL/min — ABNORMAL LOW (ref >60)
GFR calc non Af Amer: 43 mL/min — ABNORMAL LOW (ref >60)
Glucose, Bld: 104 mg/dL — ABNORMAL HIGH (ref 70–99)
Potassium: 4.4 mmol/L (ref 3.5–5.1)
Sodium: 137 mmol/L (ref 135–145)

## 2019-03-27 LAB — CBC WITH DIFFERENTIAL/PLATELET
Abs Immature Granulocytes: 0.02 10*3/uL (ref 0.00–0.07)
Basophils Absolute: 0.1 10*3/uL (ref 0.0–0.1)
Basophils Relative: 1 %
Eosinophils Absolute: 0 10*3/uL (ref 0.0–0.5)
Eosinophils Relative: 0 %
HCT: 43 % (ref 36.0–46.0)
Hemoglobin: 13.7 g/dL (ref 12.0–15.0)
Immature Granulocytes: 0 %
Lymphocytes Relative: 9 %
Lymphs Abs: 0.6 10*3/uL — ABNORMAL LOW (ref 0.7–4.0)
MCH: 32.2 pg (ref 26.0–34.0)
MCHC: 31.9 g/dL (ref 30.0–36.0)
MCV: 101.2 fL — ABNORMAL HIGH (ref 80.0–100.0)
Monocytes Absolute: 0.8 10*3/uL (ref 0.1–1.0)
Monocytes Relative: 10 %
Neutro Abs: 5.8 10*3/uL (ref 1.7–7.7)
Neutrophils Relative %: 80 %
Platelets: 247 10*3/uL (ref 150–400)
RBC: 4.25 MIL/uL (ref 3.87–5.11)
RDW: 13.1 % (ref 11.5–15.5)
WBC: 7.3 10*3/uL (ref 4.0–10.5)
nRBC: 0 % (ref 0.0–0.2)

## 2019-03-27 LAB — URINALYSIS, ROUTINE W REFLEX MICROSCOPIC
Bilirubin Urine: NEGATIVE
Glucose, UA: NEGATIVE mg/dL
Ketones, ur: 20 mg/dL — AB
Leukocytes,Ua: NEGATIVE
Nitrite: POSITIVE — AB
Protein, ur: 30 mg/dL — AB
Specific Gravity, Urine: 1.041 — ABNORMAL HIGH (ref 1.005–1.030)
pH: 5 (ref 5.0–8.0)

## 2019-03-27 MED ORDER — SODIUM CHLORIDE (PF) 0.9 % IJ SOLN
INTRAMUSCULAR | Status: AC
  Filled 2019-03-27: qty 50

## 2019-03-27 MED ORDER — FENTANYL CITRATE (PF) 100 MCG/2ML IJ SOLN
50.0000 ug | Freq: Once | INTRAMUSCULAR | Status: AC
  Administered 2019-03-27: 13:00:00 50 ug via INTRAVENOUS
  Filled 2019-03-27: qty 2

## 2019-03-27 MED ORDER — HYDROCODONE-ACETAMINOPHEN 5-325 MG PO TABS: 1.0000 | ORAL_TABLET | ORAL | 0 refills | Status: DC | PRN

## 2019-03-27 MED ORDER — FENTANYL CITRATE (PF) 100 MCG/2ML IJ SOLN
50.0000 ug | Freq: Once | INTRAMUSCULAR | Status: AC
  Administered 2019-03-27: 50 ug via INTRAVENOUS
  Filled 2019-03-27: qty 2

## 2019-03-27 MED ORDER — OXYCODONE-ACETAMINOPHEN 5-325 MG PO TABS
1.0000 | ORAL_TABLET | Freq: Once | ORAL | Status: AC
  Administered 2019-03-27: 14:00:00 1 via ORAL
  Filled 2019-03-27: qty 1

## 2019-03-27 MED ORDER — ONDANSETRON HCL 8 MG PO TABS: 8.0000 mg | ORAL_TABLET | Freq: Three times a day (TID) | ORAL | 0 refills | Status: DC | PRN

## 2019-03-27 MED ORDER — IOHEXOL 300 MG/ML  SOLN
100.0000 mL | Freq: Once | INTRAMUSCULAR | Status: AC | PRN
  Administered 2019-03-27: 75 mL via INTRAVENOUS

## 2019-03-27 MED ORDER — OXYCODONE-ACETAMINOPHEN 5-325 MG PO TABS: 1.0000 | ORAL_TABLET | Freq: Four times a day (QID) | ORAL | 0 refills | Status: DC | PRN

## 2019-03-27 NOTE — ED Provider Notes (Signed)
[facility] DEPT Provider Note   CSN: [id number] Arrival date & time: 03/27/19  [id number]     History   Chief Complaint No chief complaint on file. cc - fall  HPI Andrea Larson is a 73 y.o. female.  She is brought in by ambulance for evaluation of left flank pain.  She said she slipped in the rain yesterday and fell on the concrete.  She denies loss of consciousness but did strike her head.  She was unable to get up on her own after that.  She is not complaining of any headache double vision neck pain.  She has some posterior left chest and back pain.  She also had a skin tear on her left forearm.  She said she is able to walk with difficulty.  She rates the pain as severe and stabbing in nature worse with movement.     The history is provided by the patient.  Fall This is a new problem. The current episode started yesterday. The problem occurs constantly. The problem has not changed since onset.Pertinent negatives include no chest pain, no abdominal pain, no headaches and no shortness of breath. The symptoms are aggravated by bending, twisting and walking. Nothing relieves the symptoms. She has tried nothing for the symptoms. The treatment provided no relief.    Past Medical History:  Diagnosis Date   Allergic rhinitis    Allergic rhinitis, cause unspecified 09/21/2011   Aortic stenosis 08/23/2016   Arthritis    Asthma    Asthma 09/21/2011   Colon polyps    COPD (chronic obstructive pulmonary disease) (HCC)    mild    Coronary artery calcification 08/23/2016   Hyperlipidemia    UTI (lower urinary tract infection)     Patient Active Problem List   Diagnosis Date Noted   Mild cognitive impairment 06/24/2018   Memory loss 06/24/2018   Weight loss 06/24/2018   Peripheral edema 05/21/2018   Gait disorder 05/21/2018   C. difficile colitis 05/06/2018   Clostridium difficile diarrhea    Acute diarrhea 05/02/2018   Diarrhea  04/28/2018   CKD (chronic kidney disease), stage III ([place]) 04/28/2018   Diarrhea due to drug 04/21/2018   Malnutrition of moderate degree 04/13/2018   ARF (acute renal failure) ([place]) 04/11/2018   Right wrist pain 04/11/2018   Essential hypertension 04/11/2018   Gallstones 04/11/2018   Hypokalemia 03/21/2018   UTI (urinary tract infection) 02/19/2018   AKI (acute kidney injury) ([place]) 02/19/2018   Coronary artery calcification 08/23/2016   Aortic stenosis 08/23/2016   Chest pain 07/20/2016   Hyperglycemia 07/20/2016   Insomnia 08/18/2015   GERD (gastroesophageal reflux disease) 08/18/2015   Acute bronchitis 02/15/2015   COPD exacerbation ([place]) 02/15/2015   Tachycardia 10/28/2012   Back pain 10/28/2012   Asthma 09/21/2011   Allergic rhinitis 09/21/2011   Colon polyps 09/21/2011   Fatigue 09/21/2011   Preventative health care 09/15/2011   SYNCOPE 06/20/2010   DEGENERATIVE JOINT DISEASE, CERVICAL SPINE 10/21/2008   Overweight(278.02) 02/10/2008   ANXIETY DEPRESSION 02/10/2008   COPD (chronic obstructive pulmonary disease) ([place]) 02/10/2008   EMPHYSEMA NEC 01/30/2007   Hyperlipidemia 12/31/2006    Past Surgical History:  Procedure Laterality Date   DENTAL SURGERY     pilonidial cyst     TONSILLECTOMY       OB History   No obstetric history on file.      Home Medications    Prior to Admission medications   Medication Sig Start Date  End Date Taking? Authorizing Provider  ALPRAZolam Duanne Moron) 0.25 MG tablet TAKE (1) TABLET TWICE A DAY AS NEEDED. 01/02/19   Biagio Borg, MD  buPROPion (WELLBUTRIN XL) 150 MG 24 hr tablet Take 1 tablet (150 mg total) by mouth daily. 04/21/18   Biagio Borg, MD  diphenoxylate-atropine (LOMOTIL) 2.5-0.025 MG tablet TAKE 1 TABLET FOUR TIMES DAILY AS NEEDED FOR DIARRHEA/LOOSE STOOLS. 03/03/19   Biagio Borg, MD  donepezil (ARICEPT) 5 MG tablet Take 1 tablet (5 mg total) by mouth at bedtime. 06/24/18   Biagio Borg, MD  feeding supplement, ENSURE ENLIVE, (ENSURE ENLIVE) LIQD Take 237 mLs by mouth 3 (three) times daily between meals. Patient taking differently: Take 237 mLs by mouth 2 (two) times daily between meals.  04/21/18   Biagio Borg, MD  FLUoxetine (PROZAC) 40 MG capsule TAKE (1) CAPSULE DAILY. 02/17/19   Biagio Borg, MD  furosemide (LASIX) 40 MG tablet Take 1 tablet (40 mg total) by mouth daily for 5 days. 05/21/18 05/26/18  Biagio Borg, MD  levalbuterol [facility] HFA) 45 MCG/ACT inhaler Inhale 2 puffs into the lungs every 6 (six) hours as needed for wheezing. 03/18/18   Mannam, Hart Robinsons, MD  levalbuterol (XOPENEX) 0.63 MG/3ML nebulizer solution USE 1 VIAL VIA NEBULIZER EVERY 6 HOURS AS NEEDED FOR WHEEZING OR SHORTNESS OF BREATH. 03/02/19   Mannam, Praveen, MD  loperamide (IMODIUM A-D) 2 MG tablet Take 2 mg by mouth 4 (four) times daily as needed for diarrhea or loose stools.    [provider]  montelukast (SINGULAIR) 10 MG tablet TAKE 1 TABLET ONCE DAILY. 09/09/18   Biagio Borg, MD  Respiratory Therapy Supplies (FLUTTER) DEVI Use as directed 08/19/17   Marshell Garfinkel, MD  rosuvastatin (CRESTOR) 20 MG tablet TAKE 1 TABLET ONCE DAILY. 12/26/18   Biagio Borg, MD  SPIRIVA RESPIMAT 2.5 MCG/ACT AERS USE 1 PUFF DAILY AS DIRECTED 12/29/18   Biagio Borg, MD  SYMBICORT 160-4.5 MCG/ACT inhaler USE 2 PUFFS TWICE DAILY. 08/21/18   Biagio Borg, MD  VENTOLIN HFA 108 (90 Base) MCG/ACT inhaler USE 2 PUFFS EVERY 6 HOURS AS NEEDED FOR WHEEZING OR SHORTNESS OF BREATH. 03/02/19   Mannam, Hart Robinsons, MD  Vitamin D, Ergocalciferol, (DRISDOL) 1.25 MG (50000 UT) CAPS capsule Take 1 capsule (50,000 Units total) by mouth every 7 (seven) days. 12/24/18   Biagio Borg, MD    Family History Family History  Problem Relation Age of Onset   Alcohol abuse Other    Arthritis Other    Heart disease Other    Stroke Other    Hypertension Other    Hypertension Father    Stroke Father    Atrial  fibrillation Brother    Colon cancer Neg Hx     Social History Social History   Tobacco Use   Smoking status: Former Smoker    Packs/day: 0.75    Years: 55.00    Pack years: 41.25    Types: Cigarettes    Quit date: 04/18/2015    Years since quitting: 3.9   Smokeless tobacco: Never Used  Substance Use Topics   Alcohol use: Yes    Alcohol/week: 0.0 standard drinks    Comment: occ.    Drug use: No     Allergies   Lipitor [atorvastatin]   Review of Systems Review of Systems  Constitutional: Negative for fever.  HENT: Negative for sore throat.   Eyes: Negative for visual disturbance.  Respiratory: Negative for shortness  of breath.   Cardiovascular: Negative for chest pain.  Gastrointestinal: Negative for abdominal pain.  Genitourinary: Negative for dysuria.  Musculoskeletal: Positive for back pain. Negative for neck pain.  Skin: Negative for rash.  Neurological: Negative for headaches.     Physical Exam Updated Vital Signs BP (!) 158/108    Pulse 91    Temp 97.6 F (36.4 C) (Oral)    Resp 16    Wt 56 kg    SpO2 94%    BMI 24.11 kg/m   Physical Exam Vitals signs and nursing note reviewed.  Constitutional:      General: She is not in acute distress.    Appearance: She is well-developed.  HENT:     Head: Normocephalic.     Comments: She is a hematoma on the right side back of her head and some bruising in front of her right ear. Eyes:     Conjunctiva/sclera: Conjunctivae normal.  Neck:     Musculoskeletal: Neck supple.  Cardiovascular:     Rate and Rhythm: Normal rate and regular rhythm.     Pulses: Normal pulses.     Heart sounds: No murmur.  Pulmonary:     Effort: Pulmonary effort is normal. No respiratory distress.     Breath sounds: Normal breath sounds.  Abdominal:     Palpations: Abdomen is soft.     Tenderness: There is no abdominal tenderness.  Musculoskeletal: Normal range of motion.        General: Tenderness and signs of injury present.         Arms:     Right lower leg: No edema.     Left lower leg: No edema.     Comments: She has diffuse pain of her left flank.  She has full range of motion of her upper extremities but has limited flexion extension at her left hip which causes her flank to hurt.  No shortening or rotation.  She has skin tears on her left forearm and bruises on her upper extremities.  Skin:    General: Skin is warm and dry.     Capillary Refill: Capillary refill takes less than 2 seconds.  Neurological:     General: No focal deficit present.     Mental Status: She is alert and oriented to person, place, and time.     Sensory: No sensory deficit.     Motor: No weakness.      ED Treatments / Results  Labs (all labs ordered are listed, but only abnormal results are displayed) Labs Reviewed  BASIC METABOLIC PANEL - Abnormal; Notable for the following components:      Result Value   Glucose, Bld 104 (*)    Creatinine, Ser 1.22 (*)    GFR calc non Af Amer 43 (*)    GFR calc Af Amer 50 (*)    All other components within normal limits  CBC WITH DIFFERENTIAL/PLATELET - Abnormal; Notable for the following components:   MCV 101.2 (*)    Lymphs Abs 0.6 (*)    All other components within normal limits  URINALYSIS, ROUTINE W REFLEX MICROSCOPIC - Abnormal; Notable for the following components:   APPearance HAZY (*)    Specific Gravity, Urine 1.041 (*)    Hgb urine dipstick SMALL (*)    Ketones, ur 20 (*)    Protein, ur 30 (*)    Nitrite POSITIVE (*)    Bacteria, UA RARE (*)    All other components within normal limits  URINE CULTURE    EKG None  Radiology Ct Head Wo Contrast  Result Date: 03/27/2019 CLINICAL DATA:  Slipped and fell in the rain yesterday with injury to right side of head. EXAM: CT HEAD WITHOUT CONTRAST CT CERVICAL SPINE WITHOUT CONTRAST TECHNIQUE: Multidetector CT imaging of the head and cervical spine was performed following the standard protocol without intravenous contrast.  Multiplanar CT image reconstructions of the cervical spine were also generated. COMPARISON:  Head CT 12/18/2015 FINDINGS: CT HEAD FINDINGS Brain: The ventricles, cisterns and other CSF spaces are within normal. There is minimal age related atrophic change and chronic ischemic microvascular disease. There is no mass, mass effect, shift of midline structures or acute hemorrhage. Vascular: No hyperdense vessel or unexpected calcification. Skull: Normal. Negative for fracture or focal lesion. Sinuses/Orbits: Orbits are normal and symmetric. There is moderate opacification over the left maxillary sinus with small air-fluid level unchanged. Mastoid air cells are clear. Other: Soft tissue swelling over the right parietal scalp. CT CERVICAL SPINE FINDINGS Alignment: Subtle stable retrolisthesis of C5 with respect to C4 and C6 likely due to the moderate facet arthropathy. Subtle anterior stairstep subluxation of C3 on C4 and C4 on C5 unchanged. No posttraumatic subluxation. Skull base and vertebrae: Vertebral body heights are maintained. There is mild spondylosis throughout the cervical spine to include uncovertebral joint spurring and facet arthropathy. Moderate bilateral neural foraminal narrowing at multiple levels worse at the C5-6 level. No acute fracture. Soft tissues and spinal canal: No prevertebral fluid or swelling. No visible canal hematoma. Disc levels:  Disc space narrowing at the C5-6 level. Upper chest: No acute findings. Other: None. IMPRESSION: 1. No acute brain injury. Soft tissue swelling over the right parietal scalp. 2. Mild chronic ischemic microvascular disease. Stable chronic inflammatory change left maxillary sinus. 3.  No acute cervical spine injury. 4. Mild spondylosis throughout the cervical spine with disc disease at the C5-6 level. Multilevel bilateral neural foraminal narrowing worse at the C5-6 level. Mild stable degenerative subluxations as described. Electronically Signed   By: Marin Olp  M.D.   On: 03/27/2019 13:11   Ct Chest W Contrast  Result Date: 03/27/2019 CLINICAL DATA:  Fall, left flank pain EXAM: CT CHEST, ABDOMEN, AND PELVIS WITH CONTRAST TECHNIQUE: Multidetector CT imaging of the chest, abdomen and pelvis was performed following the standard protocol during bolus administration of intravenous contrast. CONTRAST:  76mL OMNIPAQUE IOHEXOL 300 MG/ML  SOLN COMPARISON:  Low-dose lung cancer screening CT chest dated 08/13/2016 FINDINGS: CT CHEST FINDINGS Cardiovascular: No evidence of traumatic aortic injury. No evidence thoracic aortic aneurysm or dissection. Atherosclerotic calcifications aortic root/arch. The heart is normal in size.  No pericardial effusion. Coronary atherosclerosis the LAD and right coronary artery. Mediastinum/Nodes: No suspicious mediastinal lymphadenopathy. Visualized thyroid is unremarkable. Lungs/Pleura: Mild paraseptal emphysematous changes at the lung apices. No focal consolidation. No pleural effusion or pneumothorax. Musculoskeletal: Mild superior endplate compression fracture deformity at T5 and T7, chronic. Mild degenerative changes of the thoracic spine. Nondisplaced left posterior 11th rib fracture deformity (series 3/image 52), likely accounting for the patient's acute left flank pain. Bilateral clavicles, sternum, and scapulae are intact. CT ABDOMEN PELVIS FINDINGS Hepatobiliary: Liver is within normal limits. No perihepatic fluid/hemorrhage. Gallbladder is notable for tiny gallstones in the gallbladder fundus (series 3/image 31), without associated intrahepatic or extrahepatic ductal dilatation. Pancreas: Within normal limits. Spleen: Within normal limits.  No perisplenic fluid/hemorrhage. Adrenals/Urinary Tract: Adrenal glands are within normal limits. Right renal cortical scarring. Left kidney is unremarkable. No hydronephrosis. Bladder  is within normal limits. Stomach/Bowel: Stomach is within normal limits. No evidence of bowel obstruction. Normal  appendix (series 3/image 93). Sigmoid diverticulosis, without evidence of diverticulitis. Vascular/Lymphatic: No evidence of abdominal aortic aneurysm. Atherosclerotic calcifications of the abdominal aorta and branch vessels. No suspicious abdominopelvic lymphadenopathy. Reproductive: Uterus is within normal limits. Bilateral ovaries are within normal limits. Other: No abdominopelvic ascites. Two small fat containing periumbilical ventral hernias (series 3/image 82). Musculoskeletal: Mild degenerative changes of the lumbar spine. IMPRESSION: Nondisplaced left posterior 11th rib fracture deformity, likely accounting for the patient's left flank pain. No evidence of traumatic injury to the abdomen/pelvis. Additional ancillary findings as above. Electronically Signed   By: Julian Hy M.D.   On: 03/27/2019 13:09   Ct Cervical Spine Wo Contrast  Result Date: 03/27/2019 CLINICAL DATA:  Slipped and fell in the rain yesterday with injury to right side of head. EXAM: CT HEAD WITHOUT CONTRAST CT CERVICAL SPINE WITHOUT CONTRAST TECHNIQUE: Multidetector CT imaging of the head and cervical spine was performed following the standard protocol without intravenous contrast. Multiplanar CT image reconstructions of the cervical spine were also generated. COMPARISON:  Head CT 12/18/2015 FINDINGS: CT HEAD FINDINGS Brain: The ventricles, cisterns and other CSF spaces are within normal. There is minimal age related atrophic change and chronic ischemic microvascular disease. There is no mass, mass effect, shift of midline structures or acute hemorrhage. Vascular: No hyperdense vessel or unexpected calcification. Skull: Normal. Negative for fracture or focal lesion. Sinuses/Orbits: Orbits are normal and symmetric. There is moderate opacification over the left maxillary sinus with small air-fluid level unchanged. Mastoid air cells are clear. Other: Soft tissue swelling over the right parietal scalp. CT CERVICAL SPINE FINDINGS  Alignment: Subtle stable retrolisthesis of C5 with respect to C4 and C6 likely due to the moderate facet arthropathy. Subtle anterior stairstep subluxation of C3 on C4 and C4 on C5 unchanged. No posttraumatic subluxation. Skull base and vertebrae: Vertebral body heights are maintained. There is mild spondylosis throughout the cervical spine to include uncovertebral joint spurring and facet arthropathy. Moderate bilateral neural foraminal narrowing at multiple levels worse at the C5-6 level. No acute fracture. Soft tissues and spinal canal: No prevertebral fluid or swelling. No visible canal hematoma. Disc levels:  Disc space narrowing at the C5-6 level. Upper chest: No acute findings. Other: None. IMPRESSION: 1. No acute brain injury. Soft tissue swelling over the right parietal scalp. 2. Mild chronic ischemic microvascular disease. Stable chronic inflammatory change left maxillary sinus. 3.  No acute cervical spine injury. 4. Mild spondylosis throughout the cervical spine with disc disease at the C5-6 level. Multilevel bilateral neural foraminal narrowing worse at the C5-6 level. Mild stable degenerative subluxations as described. Electronically Signed   By: Marin Olp M.D.   On: 03/27/2019 13:11   Ct Abdomen Pelvis W Contrast  Result Date: 03/27/2019 CLINICAL DATA:  Fall, left flank pain EXAM: CT CHEST, ABDOMEN, AND PELVIS WITH CONTRAST TECHNIQUE: Multidetector CT imaging of the chest, abdomen and pelvis was performed following the standard protocol during bolus administration of intravenous contrast. CONTRAST:  40mL OMNIPAQUE IOHEXOL 300 MG/ML  SOLN COMPARISON:  Low-dose lung cancer screening CT chest dated 08/13/2016 FINDINGS: CT CHEST FINDINGS Cardiovascular: No evidence of traumatic aortic injury. No evidence thoracic aortic aneurysm or dissection. Atherosclerotic calcifications aortic root/arch. The heart is normal in size.  No pericardial effusion. Coronary atherosclerosis the LAD and right coronary  artery. Mediastinum/Nodes: No suspicious mediastinal lymphadenopathy. Visualized thyroid is unremarkable. Lungs/Pleura: Mild paraseptal emphysematous changes at the  lung apices. No focal consolidation. No pleural effusion or pneumothorax. Musculoskeletal: Mild superior endplate compression fracture deformity at T5 and T7, chronic. Mild degenerative changes of the thoracic spine. Nondisplaced left posterior 11th rib fracture deformity (series 3/image 52), likely accounting for the patient's acute left flank pain. Bilateral clavicles, sternum, and scapulae are intact. CT ABDOMEN PELVIS FINDINGS Hepatobiliary: Liver is within normal limits. No perihepatic fluid/hemorrhage. Gallbladder is notable for tiny gallstones in the gallbladder fundus (series 3/image 31), without associated intrahepatic or extrahepatic ductal dilatation. Pancreas: Within normal limits. Spleen: Within normal limits.  No perisplenic fluid/hemorrhage. Adrenals/Urinary Tract: Adrenal glands are within normal limits. Right renal cortical scarring. Left kidney is unremarkable. No hydronephrosis. Bladder is within normal limits. Stomach/Bowel: Stomach is within normal limits. No evidence of bowel obstruction. Normal appendix (series 3/image 93). Sigmoid diverticulosis, without evidence of diverticulitis. Vascular/Lymphatic: No evidence of abdominal aortic aneurysm. Atherosclerotic calcifications of the abdominal aorta and branch vessels. No suspicious abdominopelvic lymphadenopathy. Reproductive: Uterus is within normal limits. Bilateral ovaries are within normal limits. Other: No abdominopelvic ascites. Two small fat containing periumbilical ventral hernias (series 3/image 82). Musculoskeletal: Mild degenerative changes of the lumbar spine. IMPRESSION: Nondisplaced left posterior 11th rib fracture deformity, likely accounting for the patient's left flank pain. No evidence of traumatic injury to the abdomen/pelvis. Additional ancillary findings as  above. Electronically Signed   By: Julian Hy M.D.   On: 03/27/2019 13:09    Procedures Procedures (including critical care time)  Medications Ordered in ED Medications  sodium chloride (PF) 0.9 % injection (has no administration in time range)  fentaNYL (SUBLIMAZE) injection 50 mcg (50 mcg Intravenous Given 03/27/19 1052)  iohexol (OMNIPAQUE) 300 MG/ML solution 100 mL (75 mLs Intravenous Contrast Given 03/27/19 1222)  fentaNYL (SUBLIMAZE) injection 50 mcg (50 mcg Intravenous Given 03/27/19 1306)  oxyCODONE-acetaminophen (PERCOCET/ROXICET) 5-325 MG per tablet 1 tablet (1 tablet Oral Given 03/27/19 1354)  fentaNYL (SUBLIMAZE) injection 50 mcg (50 mcg Intravenous Given 03/27/19 1508)     Initial Impression / Assessment and Plan / ED Course  I have reviewed the triage vital signs and the nursing notes.  Pertinent labs & imaging results that were available during my care of the patient were reviewed by me and considered in my medical decision making (see chart for details).  Clinical Course as of Mar 27 1907  Fri Mar 27, 2019  1315 Patient with mechanical fall and significant left flank pain and difficulty with ambulation.  She is a large ecchymosis over her left flank.  Differential includes rib fractures renal contusion retroperitoneal bleed CNS bleed.  CT head and cervical spine do show degenerative changes.  CT chest abdomen and pelvis show a rib fracture but no other significant findings.   [MB]  [id number] I reviewed patient's CT findings with her.  She is hoping to be able to be discharged.  They just put her in a room because her sat was 88% but she was lying flat and she probably is having some splinting respirations.  We will have her do some incentive spirometry and some oral pain medicine and see if will be appropriate to get her home.   [MB]  [id number] Patient was able to walk but had difficulty and pain.  She still would like to go home.   [MB]    Clinical Course User Index [MB]  Hayden Rasmussen, MD        Final Clinical Impressions(s) / ED Diagnoses   Final diagnoses:  Fall, initial encounter  Contusion  of flank, initial encounter  Closed fracture of one rib of left side, initial encounter    ED Discharge Orders         Ordered    oxyCODONE-acetaminophen (PERCOCET/ROXICET) 5-325 MG tablet  Every 6 hours PRN,   Status:  Discontinued     03/27/19 1503    HYDROcodone-acetaminophen (NORCO) 5-325 MG tablet  Every 4 hours PRN     03/27/19 1700    ondansetron (ZOFRAN) 8 MG tablet  Every 8 hours PRN     03/27/19 1700           Hayden Rasmussen, MD 03/27/19 1909

## 2019-03-27 NOTE — ED Notes (Signed)
Pt O2sat at 88% on RA. Placed pt on 2LNC. Pt O2sat now at 96%. MD made aware.

## 2019-03-27 NOTE — ED Notes (Signed)
Patient transported to CT 

## 2019-03-27 NOTE — ED Provider Notes (Signed)
At this time the patient is sitting up in her chair, and states she is more comfortable but had nausea and vomiting after taking Percocet.  She has previously been intolerant of Percocet and would like to change to a hydrocodone product.  She would also like to have a antiemetic.  I recall the Percocet prescription sent earlier and sent new prescriptions for Norco and Zofran.  Otherwise discharge instructions are the same.  New AVS printed.   Daleen Bo, MD 03/27/19 [phone]

## 2019-03-27 NOTE — Discharge Instructions (Signed)
You are seen in the emergency department for evaluation of injuries from a fall yesterday.  You had CAT scan of your head neck chest and abdomen.  We found a rib fracture on the lower left and you had also evidence of bruising in that area.  These are quite painful injuries and may affect her breathing.  Please continue to use the incentive spirometer and we are also sending you home with a prescription for pain medicine.  Please return to the emergency department if any worsening shortness of breath or other concerns.

## 2019-03-27 NOTE — ED Triage Notes (Signed)
Pt BIBA from home. Pt slipped in rain yesterday, falling on concrete. Pt hit right side of head, no LOC. Left forearm skin tear. Bruising noted to right hand. Pt also c/o left flank pain since the fall, no urinary issues.  170/98 HR 92 16 95% 97.9

## 2019-03-27 NOTE — ED Notes (Addendum)
Patient walked 10 ft with difficulty due to pain. O2 saturations stayed in the lower 90's on room air.

## 2019-03-28 ENCOUNTER — Other Ambulatory Visit: Payer: Self-pay | Admitting: Pulmonary Disease

## 2019-03-30 LAB — URINE CULTURE: Culture: >=100000 — AB

## 2019-03-31 ENCOUNTER — Telehealth: Payer: Self-pay | Admitting: Internal Medicine

## 2019-03-31 MED ORDER — HYDROCODONE-ACETAMINOPHEN 5-325 MG PO TABS: 1.0000 | ORAL_TABLET | ORAL | 0 refills | Status: DC | PRN

## 2019-03-31 NOTE — Telephone Encounter (Signed)
Caller name: Adaline Sill  Call back number:  [phone] Pharmacy: [facility], [place] [phone] (Phone) [phone] (Fax)     Reason for call:  Patient care giver requesting a stronger medication then  HYDROcodone-acetaminophen ([place]) 5-325 MG tablet patient was taking every 4 hrs. Patient experiencing severe rib pain, patient unable to move,patient completely out of medication, please advise

## 2019-03-31 NOTE — Telephone Encounter (Signed)
Ok this is done erx 

## 2019-03-31 NOTE — Progress Notes (Signed)
ED Antimicrobial Stewardship Positive Culture Follow Up   Andrea Larson is an 73 y.o. female who presented to [facility] on 03/27/2019 with a chief complaint of s/p fall with left flank pain.  Recent Results (from the past 720 hour(s))  Urine culture     Status: Abnormal   Collection Time: 03/27/19  1:25 PM   Specimen: Urine, Random  Result Value Ref Range Status   Specimen Description   Final    URINE, RANDOM Performed at [facility] [address] [place], [place] [address]    Special Requests   Final    NONE Performed at [facility], [place] [address], [place], [place] [address]    Culture >=100,000 COLONIES/mL ESCHERICHIA COLI (A)  Final   Report Status 03/30/2019 FINAL  Final   Organism ID, Bacteria ESCHERICHIA COLI (A)  Final      Susceptibility   Escherichia coli - MIC*    AMPICILLIN <=2 SENSITIVE Sensitive     CEFAZOLIN <=4 SENSITIVE Sensitive     CEFTRIAXONE <=1 SENSITIVE Sensitive     CIPROFLOXACIN <=0.25 SENSITIVE Sensitive     GENTAMICIN <=1 SENSITIVE Sensitive     IMIPENEM <=0.25 SENSITIVE Sensitive     NITROFURANTOIN <=16 SENSITIVE Sensitive     TRIMETH/SULFA <=20 SENSITIVE Sensitive     AMPICILLIN/SULBACTAM <=2 SENSITIVE Sensitive     PIP/TAZO <=4 SENSITIVE Sensitive     Extended ESBL NEGATIVE Sensitive     * >=100,000 COLONIES/mL ESCHERICHIA COLI    []  Treated with , organism resistant to prescribed antimicrobial [x]  Patient discharged originally without antimicrobial agent and treatment is now indicated  Plan:  1) New antibiotic prescription: cephalexin 500 mg PO QID for 10 days  ED Provider: Coral Ceo, PA-C   Lynelle Doctor 03/31/2019, 9:36 AM Clinical Pharmacist [phone]

## 2019-04-02 ENCOUNTER — Ambulatory Visit: Payer: Medicare HMO | Admitting: Pulmonary Disease

## 2019-04-15 ENCOUNTER — Encounter: Payer: Self-pay | Admitting: Pulmonary Disease

## 2019-04-15 ENCOUNTER — Other Ambulatory Visit: Payer: Self-pay | Admitting: Pulmonary Disease

## 2019-04-15 ENCOUNTER — Telehealth: Payer: Self-pay | Admitting: Pulmonary Disease

## 2019-04-15 ENCOUNTER — Other Ambulatory Visit: Payer: Self-pay

## 2019-04-15 ENCOUNTER — Ambulatory Visit: Payer: Medicare HMO | Admitting: Pulmonary Disease

## 2019-04-15 VITALS — BP 120/78 | HR 110 | Temp 98.6°F | Ht 61.0 in | Wt 112.8 lb

## 2019-04-15 DIAGNOSIS — J449 Chronic obstructive pulmonary disease, unspecified: Secondary | ICD-10-CM

## 2019-04-15 DIAGNOSIS — Z23 Encounter for immunization: Secondary | ICD-10-CM | POA: Diagnosis not present

## 2019-04-15 MED ORDER — PREDNISONE 20 MG PO TABS: 40.0000 mg | ORAL_TABLET | Freq: Every day | ORAL | 0 refills | Status: DC

## 2019-04-15 MED ORDER — LEVALBUTEROL HCL 0.63 MG/3ML IN NEBU: INHALATION_SOLUTION | RESPIRATORY_TRACT | 5 refills | Status: DC

## 2019-04-15 NOTE — Patient Instructions (Signed)
Continue Symbicort, Spiriva You can use the Xopenex every 6 hours for shortness of breath Continue topical pain relief with lidocaine patches, warm compresses Use Tylenol over-the-counter for rib pain We will prescribe prednisone 40 mg a day for 5 days We will check to see if he requires supplemental oxygen Flu vaccination today  Follow-up in 1 to 2 months.

## 2019-04-15 NOTE — Progress Notes (Signed)
Andrea Larson    [id number]    03-01-1946  Primary Care Physician:Andrea Larson, Hunt Oris, MD  Referring Physician: Biagio Borg, MD [place],  [place] [address]  Chief complaint:   Follow up for COPD GOLD B (mMRC 4, No exacerbations over past year)  HPI: Andrea Larson is a 73 year old with past medical history of COPD, asthma, hyperlipidemia.  Previously on trelegy but did not like the inhaler.  Switch back to Symbicort and Spiriva.  She has 60-pack-year smoking history, quit in October 2016. 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  She has screening CTs which did not show any lung nodules. She had coronary artery calcifications and a cardiology follow up with echo, stress test which did not show any worrisome findings.    Interim History: Complains of worsening dyspnea since last visit.  Symptoms are much worse after a mechanical fall after slipping in rain on March 27, 2019.  Evaluated in ED with rib fracture.  Has dyspnea with minimal exertion.  Denies any cough, sputum production, wheeze fevers, chills, wheezing  Outpatient Encounter Medications as of 04/15/2019  Medication Sig  . acetaminophen (TYLENOL) 325 MG tablet Take 650 mg by mouth every 6 (six) hours as needed for mild pain or headache.  . ALPRAZolam (XANAX) 0.25 MG tablet TAKE (1) TABLET TWICE A DAY AS NEEDED. (Patient taking differently: Take 0.25 mg by mouth 2 (two) times daily as needed for anxiety. )  . buPROPion (WELLBUTRIN XL) 150 MG 24 hr tablet Take 1 tablet (150 mg total) by mouth daily.  . diphenoxylate-atropine (LOMOTIL) 2.5-0.025 MG tablet TAKE 1 TABLET FOUR TIMES DAILY AS NEEDED FOR DIARRHEA/LOOSE STOOLS.  Marland Kitchen donepezil (ARICEPT) 5 MG tablet Take 1 tablet (5 mg total) by mouth at bedtime.  . feeding supplement, ENSURE ENLIVE, (ENSURE ENLIVE) LIQD Take 237 mLs by mouth 3 (three) times daily between meals.  Marland Kitchen  FLUoxetine (PROZAC) 40 MG capsule TAKE (1) CAPSULE DAILY. (Patient taking differently: Take 40 mg by mouth daily. )  . HYDROcodone-acetaminophen (NORCO) 5-325 MG tablet Take 1 tablet by mouth every 4 (four) hours as needed for moderate pain.  Marland Kitchen levalbuterol (XOPENEX HFA) 45 MCG/ACT inhaler Inhale 2 puffs into the lungs every 6 (six) hours as needed for wheezing.  . levalbuterol (XOPENEX) 0.63 MG/3ML nebulizer solution USE 1 VIAL VIA NEBULIZER EVERY 6 HOURS AS NEEDED FOR WHEEZING OR SHORTNESS OF BREATH. (Patient taking differently: Take 0.63 mg by nebulization every 6 (six) hours as needed for wheezing or shortness of breath. )  . montelukast (SINGULAIR) 10 MG tablet TAKE 1 TABLET ONCE DAILY. (Patient taking differently: Take 10 mg by mouth at bedtime. )  . ondansetron (ZOFRAN) 8 MG tablet Take 1 tablet (8 mg total) by mouth every 8 (eight) hours as needed for nausea or vomiting.  Marland Kitchen Respiratory Therapy Supplies (FLUTTER) DEVI Use as directed  . rosuvastatin (CRESTOR) 20 MG tablet TAKE 1 TABLET ONCE DAILY. (Patient taking differently: Take 20 mg by mouth daily. )  . SPIRIVA RESPIMAT 2.5 MCG/ACT AERS USE 1 PUFF DAILY AS DIRECTED (Patient taking differently: Take 1 puff by mouth daily. )  . SYMBICORT 160-4.5 MCG/ACT inhaler USE 2 PUFFS TWICE DAILY. (Patient taking differently: Inhale 2 puffs into the lungs 2 (two) times daily. )  . VENTOLIN HFA 108 (90 Base) MCG/ACT inhaler USE 2  PUFFS EVERY 6 HOURS AS NEEDED FOR WHEEZING OR SHORTNESS OF BREATH.  Marland Kitchen Vitamin D, Ergocalciferol, (DRISDOL) 1.25 MG (50000 UT) CAPS capsule Take 1 capsule (50,000 Units total) by mouth every 7 (seven) days.  . [DISCONTINUED] furosemide (LASIX) 40 MG tablet Take 1 tablet (40 mg total) by mouth daily for 5 days. (Patient not taking: Reported on 03/27/2019)   No facility-administered encounter medications on file as of 04/15/2019.    Physical Exam: Blood pressure 124/68, pulse 89, height 5' (1.524 m), weight 135 lb (61.2 kg), SpO2 95  %. Gen:      No acute distress HEENT:  EOMI, sclera anicteric Neck:     No masses; no thyromegaly Lungs:    Clear to auscultation bilaterally; normal respiratory effort CV:         Regular rate and rhythm; no murmurs Abd:      + bowel sounds; soft, non-tender; no palpable masses, no distension Ext:    No edema; adequate peripheral perfusion Skin:      Warm and dry; no rash Neuro: alert and oriented x 3 Psych: normal mood and affect  Data Reviewed: Imaging: CT chest 08/13/16- Lung-RADS Category 1 CT chest abdomen pelvis 03/27/2019- mild emphysema.  Acute 11th rib fracture.  No acute findings in abdomen pelvis. I have reviewed all images personally.  PFTs 09/06/16 FVC 1.37 (54%), FEV1 0.70 (36%), F/F 51, TLC 110% , RV/TLC 141%, DLCO 58% Severe obstruction with positive bronchodilator response, airtrapping, moderate diffusion defect.  Labs: CBC 03/27/2019-WBC 7.3, eos 0% Alpha-1 antitrypsin 03/18/2018-190, PI MM  Cardiac: Echo 09/11/15-EF 123456, grade 1 diastolic dysfunction.  Mild aortic stenosis.  Stress test 08/30/16- Low risk study  Assessment:  COPD GOLD B Continues on Symbicort, Spiriva Referred for pulmonary rehab but she prefers to exercise on her own. Continue Mucinex, flutter valve for clearance of secretion.   Symptoms worsened after recent fall.  Suspect splinting due to pain. We will give prednisone 40 mg a day for 5 days She is already received adequate narcotic pain medication.  Will avoid further prescription to prevent hypoventilation Advised topical pain relief with lidocaine patches.  Use Tylenol over-the-counter  She may benefit from supplemental oxygen but did not desat on exertion.  Will order overnight oximetry  Ex smoker Recent CT noted with no lung nodules Continue annual follow-up  Health maintenance Flu vaccine today 05/13/2013-Prevnar 08/19/2017-Pneumovax  Plan/Recommendations: - Continue symbicort, spiriva. - Xopenex nebs as needed - Prednisone  40 mg a day for 5 days - Continue screening CT of chest. - Overnight oximetry - Mucinex, flutter valve - Flu vaccine  Marshell Garfinkel MD [facility] 04/15/2019, 2:12 PM  CC: Biagio Borg, MD

## 2019-04-15 NOTE — Telephone Encounter (Signed)
Andrea Larson is placing new order with instructions for ono.  Nothing further needed.

## 2019-04-15 NOTE — Addendum Note (Signed)
Addended by: Hildred Alamin I on: 04/15/2019 04:21 PM   Modules accepted: Orders

## 2019-04-20 ENCOUNTER — Other Ambulatory Visit: Payer: Self-pay | Admitting: Pulmonary Disease

## 2019-04-20 ENCOUNTER — Other Ambulatory Visit: Payer: Self-pay | Admitting: Internal Medicine

## 2019-04-21 ENCOUNTER — Other Ambulatory Visit: Payer: Self-pay | Admitting: Internal Medicine

## 2019-04-24 ENCOUNTER — Other Ambulatory Visit: Payer: Self-pay | Admitting: Internal Medicine

## 2019-04-24 NOTE — Telephone Encounter (Signed)
Done erx 

## 2019-05-13 DIAGNOSIS — J449 Chronic obstructive pulmonary disease, unspecified: Secondary | ICD-10-CM | POA: Diagnosis not present

## 2019-05-13 DIAGNOSIS — R0902 Hypoxemia: Secondary | ICD-10-CM | POA: Diagnosis not present

## 2019-05-17 ENCOUNTER — Other Ambulatory Visit: Payer: Self-pay | Admitting: Pulmonary Disease

## 2019-05-17 ENCOUNTER — Other Ambulatory Visit: Payer: Self-pay | Admitting: Internal Medicine

## 2019-05-27 ENCOUNTER — Ambulatory Visit: Payer: Medicare HMO | Admitting: Pulmonary Disease

## 2019-06-25 ENCOUNTER — Ambulatory Visit: Payer: Medicare HMO | Admitting: Internal Medicine

## 2019-06-30 ENCOUNTER — Other Ambulatory Visit: Payer: Self-pay | Admitting: Internal Medicine

## 2019-07-07 ENCOUNTER — Other Ambulatory Visit: Payer: Self-pay | Admitting: Pulmonary Disease

## 2019-07-07 ENCOUNTER — Other Ambulatory Visit: Payer: Self-pay | Admitting: Internal Medicine

## 2019-07-07 NOTE — Telephone Encounter (Signed)
Done erx 

## 2019-07-07 NOTE — Telephone Encounter (Signed)
Last OV 12/24/18. Please advise

## 2019-07-09 ENCOUNTER — Other Ambulatory Visit: Payer: Self-pay | Admitting: Internal Medicine

## 2019-07-09 NOTE — Telephone Encounter (Signed)
Per routine 

## 2019-07-09 NOTE — Telephone Encounter (Signed)
As per routine refill policy - I have not reviewed the details

## 2019-07-09 NOTE — Telephone Encounter (Signed)
Medical screening examination/treatment/procedure(s) were performed by non-physician practitioner and as supervising physician I was immediately available for consultation/collaboration. I agree with above. Melvin Whiteford, MD   

## 2019-08-01 ENCOUNTER — Ambulatory Visit: Payer: Medicare HMO | Attending: Internal Medicine

## 2019-08-01 DIAGNOSIS — Z23 Encounter for immunization: Secondary | ICD-10-CM | POA: Insufficient documentation

## 2019-08-01 NOTE — Progress Notes (Signed)
   Covid-19 Vaccination Clinic  Name:  Andrea Larson    MRN: [id number] DOB: 20-Apr-1946  08/01/2019  [name] was observed post Covid-19 immunization for 15 minutes without incidence. She was provided with Vaccine Information Sheet and instruction to access the V-Safe system.   [name] was instructed to call 911 with any severe reactions post vaccine: Marland Kitchen Difficulty breathing  . Swelling of your face and throat  . A fast heartbeat  . A bad rash all over your body  . Dizziness and weakness    Immunizations Administered    Name Date Dose VIS Date Route   Pfizer COVID-19 Vaccine 08/01/2019  2:40 PM 0.3 mL 06/19/2019 Intramuscular   Manufacturer: [place]   Lot: [id number]   [place]: [id number]

## 2019-08-13 ENCOUNTER — Telehealth: Payer: Self-pay | Admitting: Pulmonary Disease

## 2019-08-13 NOTE — Telephone Encounter (Signed)
Overnight oximetry 05/13/2019  Performed on room air.  Duration of study 3 and half hours Time spent less than 88% - 35 minutes, oxygen desaturation index 5.47 Nadir O2 sat of 85%  Please call patient and let her know that her overnight oximetry showed low oxygen levels. Order supplemental oxygen 2 L at night and with exertion.  Marshell Garfinkel MD [facility] 08/13/2019, 6:27 PM

## 2019-08-14 NOTE — Telephone Encounter (Signed)
LMTCB x 1 

## 2019-08-17 NOTE — Telephone Encounter (Signed)
LMTCB x2  

## 2019-08-19 NOTE — Telephone Encounter (Signed)
LMTCB x 3 

## 2019-08-21 NOTE — Telephone Encounter (Signed)
LMTCB x4

## 2019-08-23 ENCOUNTER — Ambulatory Visit: Payer: Medicare HMO | Attending: Internal Medicine

## 2019-08-23 DIAGNOSIS — Z23 Encounter for immunization: Secondary | ICD-10-CM

## 2019-08-23 NOTE — Progress Notes (Signed)
   Covid-19 Vaccination Clinic  Name:  Andrea Larson    MRN: [id number] DOB: 1946/03/04  08/23/2019  [name] was observed post Covid-19 immunization for 15 minutes without incidence. She was provided with Vaccine Information Sheet and instruction to access the V-Safe system.   [name] was instructed to call 911 with any severe reactions post vaccine: Marland Kitchen Difficulty breathing  . Swelling of your face and throat  . A fast heartbeat  . A bad rash all over your body  . Dizziness and weakness    Immunizations Administered    Name Date Dose VIS Date Route   Pfizer COVID-19 Vaccine 08/23/2019  1:46 PM 0.3 mL 06/19/2019 Intramuscular   Manufacturer: [place]   Lot: [id number]   [place]: [id number]

## 2019-08-24 ENCOUNTER — Other Ambulatory Visit: Payer: Self-pay | Admitting: Internal Medicine

## 2019-08-24 NOTE — Telephone Encounter (Signed)
Done erx 

## 2019-08-26 NOTE — Progress Notes (Signed)
I have printed and mailed unable to contact letter for patient due to not being able to reach her to give ONO results.

## 2019-08-26 NOTE — Telephone Encounter (Signed)
I have not been able to reach the patient by phone and no return call. I will mail patient and unable to contact letter.

## 2019-09-17 ENCOUNTER — Encounter: Payer: Self-pay | Admitting: Pulmonary Disease

## 2019-09-17 ENCOUNTER — Ambulatory Visit (INDEPENDENT_AMBULATORY_CARE_PROVIDER_SITE_OTHER): Payer: Medicare HMO | Admitting: Pulmonary Disease

## 2019-09-17 ENCOUNTER — Other Ambulatory Visit: Payer: Self-pay

## 2019-09-17 VITALS — BP 102/60 | HR 110 | Temp 97.1°F | Ht 60.0 in | Wt 99.6 lb

## 2019-09-17 DIAGNOSIS — J449 Chronic obstructive pulmonary disease, unspecified: Secondary | ICD-10-CM | POA: Diagnosis not present

## 2019-09-17 DIAGNOSIS — Z87891 Personal history of nicotine dependence: Secondary | ICD-10-CM

## 2019-09-17 MED ORDER — TRELEGY ELLIPTA 200-62.5-25 MCG/INH IN AEPB: 1.0000 | INHALATION_SPRAY | Freq: Every day | RESPIRATORY_TRACT | 3 refills | Status: DC

## 2019-09-17 MED ORDER — TRELEGY ELLIPTA 200-62.5-25 MCG/INH IN AEPB: 1.0000 | INHALATION_SPRAY | Freq: Every day | RESPIRATORY_TRACT | 0 refills | Status: DC

## 2019-09-17 NOTE — Progress Notes (Signed)
Andrea Larson    [id number]    12/06/1945  Primary Care Physician:John, Hunt Oris, MD  Referring Physician: Biagio Borg, MD [address] [place],  [place] [address]  Chief complaint:   Follow up for COPD GOLD B (mMRC 4, No exacerbations over past year)  HPI: Mrs. Goldblatt is a 74 year old with past medical history of COPD, asthma, hyperlipidemia.  Previously on trelegy but did not like the inhaler.  Switch back to Symbicort and Spiriva.  She has 60-pack-year smoking history, quit in October 2016. 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  She has screening CTs which did not show any lung nodules. She had coronary artery calcifications and a cardiology follow up with echo, stress test which did not show any worrisome findings.   Had a rib fracture after fall in September 2020 with splinting.  Given pain medication, lidocaine patch, prednisone for 5 days with improvement in symptoms   Interim History: Complains of stable dyspnea on exertion.  Feels that Symbicort and Spiriva are not helping much and is asking for alternate inhalers Has chronic cough with mild mucus.  No wheezing.  Outpatient Encounter Medications as of 09/17/2019  Medication Sig  . acetaminophen (TYLENOL) 325 MG tablet Take 650 mg by mouth every 6 (six) hours as needed for mild pain or headache.  . ALPRAZolam (XANAX) 0.25 MG tablet TAKE (1) TABLET TWICE A DAY AS NEEDED.  Marland Kitchen buPROPion (WELLBUTRIN XL) 150 MG 24 hr tablet TAKE 1 TABLET ONCE DAILY.  . diphenoxylate-atropine (LOMOTIL) 2.5-0.025 MG tablet TAKE 1 TABLET FOUR TIMES DAILY AS NEEDED FOR DIARRHEA/LOOSE STOOLS.  Marland Kitchen donepezil (ARICEPT) 5 MG tablet TAKE (1) TABLET DAILY AT BEDTIME.  . feeding supplement, ENSURE ENLIVE, (ENSURE ENLIVE) LIQD Take 237 mLs by mouth 3 (three) times daily between meals.  Marland Kitchen FLUoxetine (PROZAC) 40 MG capsule TAKE (1) CAPSULE DAILY.  Marland Kitchen  HYDROcodone-acetaminophen (NORCO) 5-325 MG tablet Take 1 tablet by mouth every 4 (four) hours as needed for moderate pain.  Marland Kitchen levalbuterol (XOPENEX HFA) 45 MCG/ACT inhaler USE 2 PUFFS EVERY 6 HOURS AS NEEDED FOR WHEEZING. **SHAKE WELL**  . levalbuterol (XOPENEX) 0.63 MG/3ML nebulizer solution USE 1 VIAL VIA NEBULIZER EVERY 6 HOURS AS NEEDED FOR WHEEZING OR SHORTNESS OF BREATH.  . montelukast (SINGULAIR) 10 MG tablet TAKE 1 TABLET ONCE DAILY.  Marland Kitchen ondansetron (ZOFRAN) 8 MG tablet Take 1 tablet (8 mg total) by mouth every 8 (eight) hours as needed for nausea or vomiting.  Marland Kitchen Respiratory Therapy Supplies (FLUTTER) DEVI Use as directed  . rosuvastatin (CRESTOR) 20 MG tablet TAKE 1 TABLET ONCE DAILY.  Marland Kitchen SPIRIVA RESPIMAT 2.5 MCG/ACT AERS USE 1 PUFF DAILY AS DIRECTED  . SYMBICORT 160-4.5 MCG/ACT inhaler USE 2 PUFFS TWICE DAILY.  . VENTOLIN HFA 108 (90 Base) MCG/ACT inhaler USE 2 PUFFS EVERY 6 HOURS AS NEEDED FOR WHEEZING OR SHORTNESS OF BREATH.  Marland Kitchen Vitamin D, Ergocalciferol, (DRISDOL) 1.25 MG (50000 UT) CAPS capsule Take 1 capsule (50,000 Units total) by mouth every 7 (seven) days.  . [DISCONTINUED] predniSONE (DELTASONE) 20 MG tablet Take 2 tablets (40 mg total) by mouth daily with breakfast.   No facility-administered encounter medications on file as of 09/17/2019.   Physical Exam: Blood pressure 102/60, pulse (!) 110, temperature (!) 97.1 F (36.2 C), temperature source Temporal, height 5' (1.524 m), weight 99 lb 9.6 oz (45.2  kg), SpO2 94 %. Gen:      No acute distress HEENT:  EOMI, sclera anicteric Neck:     No masses; no thyromegaly Lungs:    Reduced air entry CV:         Regular rate and rhythm; no murmurs Abd:      + bowel sounds; soft, non-tender; no palpable masses, no distension Ext:    No edema; adequate peripheral perfusion Skin:      Warm and dry; no rash Neuro: alert and oriented x 3 Psych: normal mood and affect  Data Reviewed: Imaging: CT chest 08/13/16- Lung-RADS Category 1 CT  chest abdomen pelvis 03/27/2019- mild emphysema.  Acute 11th rib fracture.  No acute findings in abdomen pelvis. I have reviewed all images personally.  PFTs 09/06/16 FVC 1.37 (54%), FEV1 0.70 (36%), F/F 51, TLC 110% , RV/TLC 141%, DLCO 58% Severe obstruction with positive bronchodilator response, airtrapping, moderate diffusion defect.  Labs: CBC 03/27/2019-WBC 7.3, eos 0% Alpha-1 antitrypsin 03/18/2018-190, PI MM  Cardiac: Echo 09/11/15-EF 123456, grade 1 diastolic dysfunction.  Mild aortic stenosis.  Stress test 08/30/16- Low risk study  Sleep: Overnight oximetry 05/13/2019  Performed on room air.  Duration of study 3 and half hours Time spent less than 88% - 35 minutes, oxygen desaturation index 5.47 Nadir O2 sat of 85%  Assessment:  COPD GOLD B Try Trelegy instead of Symbicort and Spiriva Referred for pulmonary rehab but she prefers to exercise on her own. Continue Mucinex, flutter valve for clearance of secretion.   She may benefit from supplemental oxygen but did not desat on exertion.  Will order overnight oximetry  Ex smoker Recent CT noted with no lung nodules Continue annual follow-up  Nocturnal hypoxia Desats noted on overnight oximetry November 2020 Start supplemental oxygen 2 L at night and with exertion.  Health maintenance 05/13/2013-Prevnar 08/19/2017-Pneumovax  Plan/Recommendations: - Start trelegy instead of symbicort, spiriva. - Xopenex nebs as needed - Continue screening CT of chest. - Start supplemental O2 - Mucinex, flutter valve  Marshell Garfinkel MD [facility] 09/17/2019, 4:46 PM  CC: Biagio Borg, MD

## 2019-09-17 NOTE — Patient Instructions (Addendum)
We will start you on supplemental oxygen Refer for low-dose screening CT of chest. We will start you on an inhaler called Trelegy.  It takes the place of Symbicort and Spiriva Follow-up in 40-month

## 2019-09-18 ENCOUNTER — Telehealth: Payer: Self-pay

## 2019-09-18 DIAGNOSIS — G4734 Idiopathic sleep related nonobstructive alveolar hypoventilation: Secondary | ICD-10-CM

## 2019-09-18 NOTE — Telephone Encounter (Signed)
I have placed an order for the ONO to be re-done. I have not been able to reach patient on the phone as of yet to make her aware that she has to have a second one done before she can get oxygen.

## 2019-09-18 NOTE — Telephone Encounter (Signed)
ATC patient and had to leave a voicemail. We have to re-order an ONO as the last one is out of the 30 day range for nocturnal oxygen.

## 2019-09-18 NOTE — Telephone Encounter (Signed)
-----   Message from Joellen Jersey sent at 09/18/2019  8:31 AM EST ----- This ono on ra was done in 11/20 it is too old must be within 30days for medicare to qualify do you want to do another ono thanks libby

## 2019-09-21 NOTE — Telephone Encounter (Signed)
Andrea Larson returning call.  [phone].

## 2019-09-22 ENCOUNTER — Other Ambulatory Visit: Payer: Self-pay | Admitting: Internal Medicine

## 2019-09-23 NOTE — Telephone Encounter (Signed)
I spoke with [name] and made her aware that due to the last ONO being past the 30 day mark that Andrea Larson would have to have another one before they can supply her with oxygen. I advised her that they should be reaching out to them soon to get that set up. She verbalized understanding.

## 2019-09-23 NOTE — Telephone Encounter (Signed)
LMTCB

## 2019-10-05 DIAGNOSIS — J449 Chronic obstructive pulmonary disease, unspecified: Secondary | ICD-10-CM | POA: Diagnosis not present

## 2019-10-05 DIAGNOSIS — R0902 Hypoxemia: Secondary | ICD-10-CM | POA: Diagnosis not present

## 2019-10-06 ENCOUNTER — Other Ambulatory Visit: Payer: Self-pay | Admitting: Pulmonary Disease

## 2019-10-26 ENCOUNTER — Other Ambulatory Visit: Payer: Self-pay | Admitting: Internal Medicine

## 2019-10-26 NOTE — Telephone Encounter (Signed)
Please refill as per office routine med refill policy (all routine meds refilled for 3 mo or monthly per pt preference up to one year from last visit, then month to month grace period for 3 mo, then further med refills will have to be denied)  

## 2019-10-27 ENCOUNTER — Other Ambulatory Visit: Payer: Self-pay | Admitting: Internal Medicine

## 2019-11-06 ENCOUNTER — Other Ambulatory Visit: Payer: Self-pay | Admitting: *Deleted

## 2019-11-06 DIAGNOSIS — I350 Nonrheumatic aortic (valve) stenosis: Secondary | ICD-10-CM

## 2019-11-06 DIAGNOSIS — I10 Essential (primary) hypertension: Secondary | ICD-10-CM

## 2019-11-10 DIAGNOSIS — J449 Chronic obstructive pulmonary disease, unspecified: Secondary | ICD-10-CM | POA: Diagnosis not present

## 2019-11-10 DIAGNOSIS — J441 Chronic obstructive pulmonary disease with (acute) exacerbation: Secondary | ICD-10-CM | POA: Diagnosis not present

## 2019-11-10 DIAGNOSIS — J438 Other emphysema: Secondary | ICD-10-CM | POA: Diagnosis not present

## 2019-11-27 ENCOUNTER — Other Ambulatory Visit: Payer: Self-pay

## 2019-11-27 ENCOUNTER — Ambulatory Visit (HOSPITAL_COMMUNITY): Payer: Medicare HMO | Attending: Cardiology

## 2019-11-27 DIAGNOSIS — I350 Nonrheumatic aortic (valve) stenosis: Secondary | ICD-10-CM | POA: Diagnosis not present

## 2019-11-27 DIAGNOSIS — I10 Essential (primary) hypertension: Secondary | ICD-10-CM | POA: Insufficient documentation

## 2019-12-11 DIAGNOSIS — J441 Chronic obstructive pulmonary disease with (acute) exacerbation: Secondary | ICD-10-CM | POA: Diagnosis not present

## 2019-12-11 DIAGNOSIS — J438 Other emphysema: Secondary | ICD-10-CM | POA: Diagnosis not present

## 2019-12-11 DIAGNOSIS — J449 Chronic obstructive pulmonary disease, unspecified: Secondary | ICD-10-CM | POA: Diagnosis not present

## 2019-12-14 ENCOUNTER — Other Ambulatory Visit: Payer: Self-pay | Admitting: Internal Medicine

## 2019-12-14 NOTE — Telephone Encounter (Signed)
Xanax done erx 1 mo only  Ok to let pt know - due for ROV for further refills

## 2019-12-22 ENCOUNTER — Other Ambulatory Visit: Payer: Self-pay | Admitting: *Deleted

## 2019-12-22 DIAGNOSIS — Z87891 Personal history of nicotine dependence: Secondary | ICD-10-CM

## 2020-01-10 DIAGNOSIS — J441 Chronic obstructive pulmonary disease with (acute) exacerbation: Secondary | ICD-10-CM | POA: Diagnosis not present

## 2020-01-10 DIAGNOSIS — J449 Chronic obstructive pulmonary disease, unspecified: Secondary | ICD-10-CM | POA: Diagnosis not present

## 2020-01-10 DIAGNOSIS — J438 Other emphysema: Secondary | ICD-10-CM | POA: Diagnosis not present

## 2020-01-13 DIAGNOSIS — L281 Prurigo nodularis: Secondary | ICD-10-CM | POA: Diagnosis not present

## 2020-01-13 DIAGNOSIS — D485 Neoplasm of uncertain behavior of skin: Secondary | ICD-10-CM | POA: Diagnosis not present

## 2020-01-20 ENCOUNTER — Encounter: Payer: Self-pay | Admitting: Acute Care

## 2020-01-20 ENCOUNTER — Ambulatory Visit: Payer: Medicare HMO | Admitting: Acute Care

## 2020-01-20 ENCOUNTER — Other Ambulatory Visit: Payer: Self-pay

## 2020-01-20 VITALS — BP 100/60 | HR 71 | Temp 97.3°F | Ht 61.5 in | Wt 105.6 lb

## 2020-01-20 DIAGNOSIS — J449 Chronic obstructive pulmonary disease, unspecified: Secondary | ICD-10-CM

## 2020-01-20 DIAGNOSIS — Z87891 Personal history of nicotine dependence: Secondary | ICD-10-CM

## 2020-01-20 NOTE — Progress Notes (Signed)
History of Present Illness Andrea Larson is a 74 y.o. female with COPD and nocturnal hypoxemia. . She is followed by Dr. Vaughan Browner.    01/20/2020  Pt. Follows up for management of her COPD with oxygen. She is here today for an OV for a new prescription for her nocturnal  oxygen. Per her DME, there was never a prescription for oxygen. The patient already has her oxygen set up at home for use, so she and her caregiver are uncertain why this OV is necessary.  Per both the patient and the caregiver, the patient is doing much better in general on the days after she wears her nocturnal oxygen. ( Memory is better, and over all higher level of functioning.) The patient is not compliant with use every night. She states she forgets some nights to wear it.   She denies any fever, chest pain, orthopnea or hemoptysis.   Test Results:  Imaging: CT chest 08/13/16- Lung-RADS Category 1 CT chest abdomen pelvis 03/27/2019- mild emphysema.  Acute 11th rib fracture.  No acute findings in abdomen pelvis.   PFTs 09/06/16 FVC 1.37 (54%), FEV1 0.70 (36%), F/F 51, TLC 110% , RV/TLC 141%, DLCO 58% Severe obstruction with positive bronchodilator response, airtrapping, moderate diffusion defect.  Labs: CBC 03/27/2019-WBC 7.3, eos 0% Alpha-1 antitrypsin 03/18/2018-190, PI MM  Cardiac: Echo 09/11/15-EF 28-31%, grade 1 diastolic dysfunction.  Mild aortic stenosis.  Stress test 08/30/16- Low risk study  Sleep: Overnight oximetry 05/13/2019  Performed on room air. Duration of study 3 and half hours Time spent less than 88% - 35 minutes, oxygen desaturation index 5.47 Nadir O2 sat of 85%   CBC Latest Ref Rng & Units 03/27/2019 12/24/2018 04/29/2018  WBC 4.0 - 10.5 K/uL 7.3 7.0 5.3  Hemoglobin 12.0 - 15.0 g/dL 13.7 14.0 9.6(L)  Hematocrit 36 - 46 % 43.0 42.0 32.3(L)  Platelets 150 - 400 K/uL 247 298.0 328    BMP Latest Ref Rng & Units 03/27/2019 12/24/2018 05/06/2018  Glucose 70 - 99 mg/dL 104(H) 92 87  BUN  8 - 23 mg/dL 15 24(H) 5(L)  Creatinine 0.44 - 1.00 mg/dL 1.22(H) 1.05 1.08(H)  Sodium 135 - 145 mmol/L 137 137 141  Potassium 3.5 - 5.1 mmol/L 4.4 4.3 3.4(L)  Chloride 98 - 111 mmol/L 102 104 111  CO2 22 - 32 mmol/L 25 25 23   Calcium 8.9 - 10.3 mg/dL 9.0 9.0 7.9(L)    BNP No results found for: BNP  ProBNP    Component Value Date/Time   PROBNP 535.70 (H) 10/28/2012 1418    PFT    Component Value Date/Time   FEV1PRE 0.50 09/06/2016 1157   FEV1POST 0.70 09/06/2016 1157   FVCPRE 1.13 09/06/2016 1157   FVCPOST 1.37 09/06/2016 1157   TLC 5.11 09/06/2016 1157   DLCOUNC 11.73 09/06/2016 1157   PREFEV1FVCRT 44 09/06/2016 1157   PSTFEV1FVCRT 51 09/06/2016 1157    No results found.   Past medical hx Past Medical History:  Diagnosis Date  . Allergic rhinitis   . Allergic rhinitis, cause unspecified 09/21/2011  . Aortic stenosis 08/23/2016  . Arthritis   . Asthma   . Asthma 09/21/2011  . Colon polyps   . COPD (chronic obstructive pulmonary disease) (HCC)    mild   . Coronary artery calcification 08/23/2016  . Hyperlipidemia   . UTI (lower urinary tract infection)      Social History   Tobacco Use  . Smoking status: Former Smoker    Packs/day: 0.75  Years: 55.00    Pack years: 41.25    Types: Cigarettes    Quit date: 04/18/2015    Years since quitting: 4.7  . Smokeless tobacco: Never Used  Vaping Use  . Vaping Use: Never used  Substance Use Topics  . Alcohol use: Yes    Alcohol/week: 0.0 standard drinks    Comment: occ.   . Drug use: No    Andrea Larson reports that she quit smoking about 4 years ago. Her smoking use included cigarettes. She has a 41.25 pack-year smoking history. She has never used smokeless tobacco. She reports current alcohol use. She reports that she does not use drugs.  Tobacco Cessation: Former smoker Quit 2016 with a 41 pack year smoking history  Past surgical hx, Family hx, Social hx all reviewed.  Current Outpatient Medications on File  Prior to Visit  Medication Sig  . acetaminophen (TYLENOL) 325 MG tablet Take 650 mg by mouth every 6 (six) hours as needed for mild pain or headache.  . ALPRAZolam (XANAX) 0.25 MG tablet TAKE (1) TABLET TWICE A DAY AS NEEDED.  Marland Kitchen buPROPion (WELLBUTRIN XL) 150 MG 24 hr tablet TAKE 1 TABLET ONCE DAILY.  . diphenoxylate-atropine (LOMOTIL) 2.5-0.025 MG tablet TAKE 1 TABLET FOUR TIMES DAILY AS NEEDED FOR DIARRHEA/LOOSE STOOLS.  Marland Kitchen donepezil (ARICEPT) 5 MG tablet TAKE (1) TABLET DAILY AT BEDTIME.  . feeding supplement, ENSURE ENLIVE, (ENSURE ENLIVE) LIQD Take 237 mLs by mouth 3 (three) times daily between meals.  Marland Kitchen FLUoxetine (PROZAC) 40 MG capsule TAKE (1) CAPSULE DAILY.  Marland Kitchen Fluticasone-Umeclidin-Vilant (TRELEGY ELLIPTA) 200-62.5-25 MCG/INH AEPB Inhale 1 puff into the lungs daily.  . Fluticasone-Umeclidin-Vilant (TRELEGY ELLIPTA) 200-62.5-25 MCG/INH AEPB Inhale 1 puff into the lungs daily.  Marland Kitchen HYDROcodone-acetaminophen (NORCO) 5-325 MG tablet Take 1 tablet by mouth every 4 (four) hours as needed for moderate pain.  Marland Kitchen levalbuterol (XOPENEX HFA) 45 MCG/ACT inhaler USE 2 PUFFS EVERY 6 HOURS AS NEEDED FOR WHEEZING. **SHAKE WELL**  . levalbuterol (XOPENEX) 0.63 MG/3ML nebulizer solution USE 1 VIAL VIA NEBULIZER EVERY 6 HOURS AS NEEDED FOR WHEEZING OR SHORTNESS OF BREATH.  . montelukast (SINGULAIR) 10 MG tablet TAKE 1 TABLET ONCE DAILY.  Marland Kitchen ondansetron (ZOFRAN) 8 MG tablet TAKE 1 TABLET EVERY 8 HOURS AS NEEDED FOR NAUSEA AND VOMITING.  Marland Kitchen Respiratory Therapy Supplies (FLUTTER) DEVI Use as directed  . rosuvastatin (CRESTOR) 20 MG tablet TAKE 1 TABLET ONCE DAILY.  Marland Kitchen Tiotropium Bromide Monohydrate (SPIRIVA RESPIMAT) 2.5 MCG/ACT AERS USE 1 PUFF DAILY AS DIRECTED Annual appt due in June must see provider for future refills  . VENTOLIN HFA 108 (90 Base) MCG/ACT inhaler USE 2 PUFFS EVERY 6 HOURS AS NEEDED FOR WHEEZING OR SHORTNESS OF BREATH.  Marland Kitchen Vitamin D, Ergocalciferol, (DRISDOL) 1.25 MG (50000 UT) CAPS capsule Take 1  capsule (50,000 Units total) by mouth every 7 (seven) days.   No current facility-administered medications on file prior to visit.     Allergies  Allergen Reactions  . Lipitor [Atorvastatin] Other (See Comments)    Myalgias    Review Of Systems:  Constitutional:   No  weight loss, night sweats,  Fevers, chills, fatigue, or  lassitude.  HEENT:   No headaches,  Difficulty swallowing,  Tooth/dental problems, or  Sore throat,                No sneezing, itching, ear ache, nasal congestion, post nasal drip,   CV:  No chest pain,  Orthopnea, PND, swelling in lower extremities, anasarca, dizziness, palpitations, syncope.  GI  No heartburn, indigestion, abdominal pain, nausea, vomiting, diarrhea, change in bowel habits, loss of appetite, bloody stools.   Resp: Occasional  shortness of breath with exertion or at rest.  No excess mucus, no productive cough,  No non-productive cough,  No coughing up of blood.  No change in color of mucus.  No wheezing.  No chest wall deformity  Skin: no rash or lesions.  GU: no dysuria, change in color of urine, no urgency or frequency.  No flank pain, no hematuria   MS:  No joint pain or swelling.  No decreased range of motion.  No back pain.  Psych:  No change in mood or affect. No depression or anxiety.  No memory loss.   Vital Signs BP 100/60 (BP Location: Left Arm, Cuff Size: Normal)   Pulse 71   Temp (!) 97.3 F (36.3 C) (Oral)   Ht 5' 1.5" (1.562 m)   Wt 105 lb 9.6 oz (47.9 kg)   SpO2 94%   BMI 19.63 kg/m    Physical Exam:  General- No distress,  A&Ox3, pleasant and forgetful at times ENT: No sinus tenderness, TM clear, pale nasal mucosa, no oral exudate,no post nasal drip, no LAN Cardiac: S1, S2, regular rate and rhythm, no murmur Chest: No wheeze/ rales/ dullness; no accessory muscle use, no nasal flaring, no sternal retractions Abd.: Soft Non-tender, ND, BS + Ext: No clubbing cyanosis, edema Neuro:  Physical deconditioning, MAE x  4, A&O x 3,  Skin: No rashes, No lesions, warm and dry Psych: normal mood and behavior   Assessment/Plan  COPD Gold B - Continue  trelegy  One puff once daily - Rinse mouth after use - Xopenex nebs as needed - Schedule low dose CT for July or August  - Mucinex, flutter valve - Follow up with Dr. Concepcion Living or Judson Roch NP  in 3 months  - Please contact office for sooner follow up if symptoms do not improve or worsen or seek emergency care   Nocturnal hypoxemia Please place order for oxygen at 2 L [place] at bedtime  - Start supplemental O2 at 2 L [place] at bedtime  This appointment was 30 min long with over 50% of the time in direct face-to-face patient care, assessment, plan of care, and follow-up.   Magdalen Spatz, NP 01/20/2020  3:12 PM

## 2020-01-20 NOTE — Patient Instructions (Addendum)
It is good to see you today. - Continue  trelegy  One puff once daily - Rinse mouth after use - Xopenex nebs as needed - Schedule low dose CT for July or August  - Start supplemental O2 at 2 L [place] at bedtime - Mucinex, flutter valve - Follow up with Dr. Concepcion Living or Judson Roch NP  in 3 months  - Please contact office for sooner follow up if symptoms do not improve or worsen or seek emergency care

## 2020-01-21 ENCOUNTER — Encounter: Payer: Self-pay | Admitting: Acute Care

## 2020-01-26 ENCOUNTER — Telehealth: Payer: Self-pay | Admitting: Acute Care

## 2020-01-26 NOTE — Telephone Encounter (Signed)
Called [employer] and Darlina Guys is going to look into this matter.

## 2020-02-10 ENCOUNTER — Other Ambulatory Visit: Payer: Self-pay

## 2020-02-10 ENCOUNTER — Ambulatory Visit (INDEPENDENT_AMBULATORY_CARE_PROVIDER_SITE_OTHER)
Admission: RE | Admit: 2020-02-10 | Discharge: 2020-02-10 | Disposition: A | Discharge status: Home / Self Care | Payer: Medicare HMO | Source: Ambulatory Visit | Attending: Acute Care | Admitting: Acute Care

## 2020-02-10 DIAGNOSIS — J441 Chronic obstructive pulmonary disease with (acute) exacerbation: Secondary | ICD-10-CM | POA: Diagnosis not present

## 2020-02-10 DIAGNOSIS — Z87891 Personal history of nicotine dependence: Secondary | ICD-10-CM

## 2020-02-10 DIAGNOSIS — J438 Other emphysema: Secondary | ICD-10-CM | POA: Diagnosis not present

## 2020-02-10 DIAGNOSIS — J449 Chronic obstructive pulmonary disease, unspecified: Secondary | ICD-10-CM | POA: Diagnosis not present

## 2020-02-15 NOTE — Progress Notes (Signed)
Please call patient and let them  know their  low dose Ct was read as a Lung RADS 2: nodules that are benign in appearance and behavior with a very low likelihood of becoming a clinically active cancer due to size or lack of growth. Recommendation per radiology is for a repeat LDCT in 12 months. .Please let them  know we will order and schedule their  annual screening scan for 02/2021. Please let them  know there was notation of CAD on their  scan.  Please remind the patient  that this is a non-gated exam therefore degree or severity of disease  cannot be determined. Please have them  follow up with their PCP regarding potential risk factor modification, dietary therapy or pharmacologic therapy if clinically indicated. Pt.  is  currently on statin therapy. Please place order for annual  screening scan for  02/2021 and fax results to PCP. Thanks so much.  Langley Gauss, please let patient know she has some new compression fractures since her last scan in 2020 and to follow up with her PCP or ortho. Thanks so much

## 2020-02-18 ENCOUNTER — Other Ambulatory Visit: Payer: Self-pay | Admitting: *Deleted

## 2020-02-18 DIAGNOSIS — Z87891 Personal history of nicotine dependence: Secondary | ICD-10-CM

## 2020-02-22 ENCOUNTER — Other Ambulatory Visit: Payer: Self-pay | Admitting: Internal Medicine

## 2020-02-22 NOTE — Telephone Encounter (Signed)
prozac and xanax done erx for 1 mo only  Please to let pt know this has to be last rx due to office refill policy without ROV

## 2020-03-12 DIAGNOSIS — J438 Other emphysema: Secondary | ICD-10-CM | POA: Diagnosis not present

## 2020-03-12 DIAGNOSIS — J449 Chronic obstructive pulmonary disease, unspecified: Secondary | ICD-10-CM | POA: Diagnosis not present

## 2020-03-12 DIAGNOSIS — J441 Chronic obstructive pulmonary disease with (acute) exacerbation: Secondary | ICD-10-CM | POA: Diagnosis not present

## 2020-04-05 ENCOUNTER — Ambulatory Visit (INDEPENDENT_AMBULATORY_CARE_PROVIDER_SITE_OTHER): Payer: Medicare HMO | Admitting: Internal Medicine

## 2020-04-05 ENCOUNTER — Encounter: Payer: Self-pay | Admitting: Internal Medicine

## 2020-04-05 ENCOUNTER — Other Ambulatory Visit: Payer: Self-pay

## 2020-04-05 VITALS — BP 140/86 | HR 111 | Temp 97.9°F | Ht 61.5 in | Wt 104.0 lb

## 2020-04-05 DIAGNOSIS — J9611 Chronic respiratory failure with hypoxia: Secondary | ICD-10-CM | POA: Diagnosis not present

## 2020-04-05 DIAGNOSIS — N183 Chronic kidney disease, stage 3 unspecified: Secondary | ICD-10-CM

## 2020-04-05 DIAGNOSIS — Z0000 Encounter for general adult medical examination without abnormal findings: Secondary | ICD-10-CM | POA: Diagnosis not present

## 2020-04-05 DIAGNOSIS — R739 Hyperglycemia, unspecified: Secondary | ICD-10-CM

## 2020-04-05 DIAGNOSIS — I10 Essential (primary) hypertension: Secondary | ICD-10-CM

## 2020-04-05 DIAGNOSIS — Z23 Encounter for immunization: Secondary | ICD-10-CM

## 2020-04-05 DIAGNOSIS — E785 Hyperlipidemia, unspecified: Secondary | ICD-10-CM | POA: Diagnosis not present

## 2020-04-05 DIAGNOSIS — F1027 Alcohol dependence with alcohol-induced persisting dementia: Secondary | ICD-10-CM

## 2020-04-05 DIAGNOSIS — J449 Chronic obstructive pulmonary disease, unspecified: Secondary | ICD-10-CM

## 2020-04-05 DIAGNOSIS — E039 Hypothyroidism, unspecified: Secondary | ICD-10-CM | POA: Diagnosis not present

## 2020-04-05 MED ORDER — ALPRAZOLAM 0.25 MG PO TABS: ORAL_TABLET | ORAL | 2 refills | Status: DC

## 2020-04-05 MED ORDER — DONEPEZIL HCL 10 MG PO TABS: 10.0000 mg | ORAL_TABLET | Freq: Every day | ORAL | 3 refills | Status: DC

## 2020-04-05 MED ORDER — ROSUVASTATIN CALCIUM 20 MG PO TABS: 20.0000 mg | ORAL_TABLET | Freq: Every day | ORAL | 3 refills | Status: DC

## 2020-04-05 MED ORDER — FLUOXETINE HCL 40 MG PO CAPS: ORAL_CAPSULE | ORAL | 3 refills | Status: DC

## 2020-04-05 MED ORDER — BUPROPION HCL ER (XL) 150 MG PO TB24: 150.0000 mg | ORAL_TABLET | Freq: Every day | ORAL | 3 refills | Status: DC

## 2020-04-05 MED ORDER — MONTELUKAST SODIUM 10 MG PO TABS: 10.0000 mg | ORAL_TABLET | Freq: Every day | ORAL | 3 refills | Status: DC

## 2020-04-05 NOTE — Patient Instructions (Signed)
You had the flu shot today  Ok to increase the aricept to 10 mg per day  Please continue all other medications as before, and refills have been done if requested.  Please have the pharmacy call with any other refills you may need.  Please continue your efforts at being more active, low cholesterol diet, and weight control.  You are otherwise up to date with prevention measures today.  Please keep your appointments with your specialists as you may have planned  Please go to the LAB at the blood drawing area for the tests to be done  You will be contacted by phone if any changes need to be made immediately.  Otherwise, you will receive a letter about your results with an explanation, but please check with MyChart first.  Please remember to sign up for MyChart if you have not done so, as this will be important to you in the future with finding out test results, communicating by private email, and scheduling acute appointments online when needed.  Please make an Appointment to return in 6 months, or sooner if needed

## 2020-04-05 NOTE — Progress Notes (Signed)
Subjective:    Patient ID: Andrea Larson, female    DOB: 25-Aug-1945, 74 y.o.   MRN: [id number]  HPI  Here for wellness and f/u with roommate who takes care of her;  Overall doing ok;  Pt denies Chest pain, worsening SOB, DOE, wheezing, orthopnea, PND, worsening LE edema, palpitations, dizziness or syncope.  Pt denies neurological change such as new headache, facial or extremity weakness.  Pt denies polydipsia, polyuria, or low sugar symptoms. Pt states overall good compliance with treatment and medications, good tolerability, and has been trying to follow appropriate diet.  Pt denies worsening depressive symptoms, suicidal ideation or panic. No fever, night sweats, wt loss, loss of appetite, or other constitutional symptoms.  Pt states good ability with ADL's, has low fall risk, home safety reviewed and adequate, no other significant changes in hearing or vision, and not active with exercise.  Memory only getting worse in the last 3 months.  Still requires home o2 2l qhs.  ETOH - only drinks wine now as can no longer drive and roommate will not buy hard liquour for her.  Denies urinary symptoms such as dysuria, frequency, urgency, flank pain, hematuria or n/v, fever, chills. Past Medical History:  Diagnosis Date  . Allergic rhinitis   . Allergic rhinitis, cause unspecified 09/21/2011  . Aortic stenosis 08/23/2016  . Arthritis   . Asthma   . Asthma 09/21/2011  . Colon polyps   . COPD (chronic obstructive pulmonary disease) (HCC)    mild   . Coronary artery calcification 08/23/2016  . Hyperlipidemia   . UTI (lower urinary tract infection)    Past Surgical History:  Procedure Laterality Date  . DENTAL SURGERY    . pilonidial cyst    . TONSILLECTOMY      reports that she quit smoking about 4 years ago. Her smoking use included cigarettes. She has a 41.25 pack-year smoking history. She has never used smokeless tobacco. She reports current alcohol use. She reports that she does not use  drugs. family history includes Alcohol abuse in an other family member; Arthritis in an other family member; Atrial fibrillation in her brother; Heart disease in an other family member; Hypertension in her father and another family member; Stroke in her father and another family member. Allergies  Allergen Reactions  . Lipitor [Atorvastatin] Other (See Comments)    Myalgias   Current Outpatient Medications on File Prior to Visit  Medication Sig Dispense Refill  . acetaminophen (TYLENOL) 325 MG tablet Take 650 mg by mouth every 6 (six) hours as needed for mild pain or headache.    . diphenoxylate-atropine (LOMOTIL) 2.5-0.025 MG tablet TAKE 1 TABLET FOUR TIMES DAILY AS NEEDED FOR DIARRHEA/LOOSE STOOLS. 40 tablet 0  . feeding supplement, ENSURE ENLIVE, (ENSURE ENLIVE) LIQD Take 237 mLs by mouth 3 (three) times daily between meals. 237 mL   . Fluticasone-Umeclidin-Vilant (TRELEGY ELLIPTA) 200-62.5-25 MCG/INH AEPB Inhale 1 puff into the lungs daily. 14 each 0  . levalbuterol (XOPENEX HFA) 45 MCG/ACT inhaler USE 2 PUFFS EVERY 6 HOURS AS NEEDED FOR WHEEZING. **SHAKE WELL** 15 g 5  . levalbuterol (XOPENEX) 0.63 MG/3ML nebulizer solution USE 1 VIAL VIA NEBULIZER EVERY 6 HOURS AS NEEDED FOR WHEEZING OR SHORTNESS OF BREATH. 72 mL 5  . ondansetron (ZOFRAN) 8 MG tablet TAKE 1 TABLET EVERY 8 HOURS AS NEEDED FOR NAUSEA AND VOMITING. 20 tablet 0  . Respiratory Therapy Supplies (FLUTTER) DEVI Use as directed 1 each 0  . Tiotropium Bromide Monohydrate (SPIRIVA RESPIMAT)  2.5 MCG/ACT AERS USE 1 PUFF DAILY AS DIRECTED Annual appt due in June must see provider for future refills 4 g 2  . VENTOLIN HFA 108 (90 Base) MCG/ACT inhaler USE 2 PUFFS EVERY 6 HOURS AS NEEDED FOR WHEEZING OR SHORTNESS OF BREATH. 18 g 5   No current facility-administered medications on file prior to visit.   Review of Systems All otherwise neg per pt=    Objective:   Physical Exam BP 140/86 (BP Location: Left Arm, Patient Position:  Sitting, Cuff Size: Large)   Pulse (!) 111   Temp 97.9 F (36.6 C) (Oral)   Ht 5' 1.5" (1.562 m)   Wt 104 lb (47.2 kg)   SpO2 90%   BMI 19.33 kg/m  VS noted,  Constitutional: Pt appears in NAD HENT: Head: NCAT.  Right Ear: External ear normal.  Left Ear: External ear normal.  Eyes: . Pupils are equal, round, and reactive to light. Conjunctivae and EOM are normal Nose: without d/c or deformity Neck: Neck supple. Gross normal ROM Cardiovascular: Normal rate and regular rhythm.   Pulmonary/Chest: Effort normal and breath sounds without rales or wheezing.  Abd:  Soft, NT, ND, + BS, no organomegaly Neurological: Pt is alert. At baseline orientation, motor grossly intact, cn 2-12 intact, ST memory mod to severe decreased Skin: Skin is warm. No rashes, other new lesions, no LE edema Psychiatric: Pt behavior is normal without agitation  All otherwise neg per pt Lab Results  Component Value Date   WBC 6.0 04/05/2020   HGB 14.3 04/05/2020   HCT 43.0 04/05/2020   PLT 398 04/05/2020   GLUCOSE 91 04/05/2020   CHOL 247 (H) 04/05/2020   TRIG 122 04/05/2020   HDL 84 04/05/2020   LDLDIRECT 124.8 09/21/2011   LDLCALC 139 (H) 04/05/2020   ALT 11 04/05/2020   AST 22 04/05/2020   NA 136 04/05/2020   K 4.9 04/05/2020   CL 99 04/05/2020   CREATININE 1.68 (H) 04/05/2020   BUN 19 04/05/2020   CO2 17 (L) 04/05/2020   TSH 1.13 04/05/2020   HGBA1C 4.9 04/05/2020          Assessment & Plan:

## 2020-04-06 LAB — CBC WITH DIFFERENTIAL/PLATELET
Absolute Monocytes: 606 cells/uL (ref 200–950)
Basophils Absolute: 90 cells/uL (ref 0–200)
Basophils Relative: 1.5 %
Eosinophils Absolute: 30 cells/uL (ref 15–500)
Eosinophils Relative: 0.5 %
HCT: 43 % (ref 35.0–45.0)
Hemoglobin: 14.3 g/dL (ref 11.7–15.5)
Lymphs Abs: 1518 cells/uL (ref 850–3900)
MCH: 33.1 pg — ABNORMAL HIGH (ref 27.0–33.0)
MCHC: 33.3 g/dL (ref 32.0–36.0)
MCV: 99.5 fL (ref 80.0–100.0)
MPV: 9.9 fL (ref 7.5–12.5)
Monocytes Relative: 10.1 %
Neutro Abs: 3756 cells/uL (ref 1500–7800)
Neutrophils Relative %: 62.6 %
Platelets: 398 10*3/uL (ref 140–400)
RBC: 4.32 10*6/uL (ref 3.80–5.10)
RDW: 11.8 % (ref 11.0–15.0)
Total Lymphocyte: 25.3 %
WBC: 6 10*3/uL (ref 3.8–10.8)

## 2020-04-06 LAB — TSH: TSH: 1.13 mIU/L (ref 0.40–4.50)

## 2020-04-06 LAB — LIPID PANEL
Cholesterol: 247 mg/dL — ABNORMAL HIGH (ref <200)
HDL: 84 mg/dL (ref >=50)
LDL Cholesterol (Calc): 139 mg/dL (calc) — ABNORMAL HIGH
Non-HDL Cholesterol (Calc): 163 mg/dL (calc) — ABNORMAL HIGH (ref <130)
Total CHOL/HDL Ratio: 2.9 (calc) (ref <5.0)
Triglycerides: 122 mg/dL (ref <150)

## 2020-04-06 LAB — URINALYSIS, ROUTINE W REFLEX MICROSCOPIC
Bilirubin Urine: NEGATIVE
Glucose, UA: NEGATIVE
Hgb urine dipstick: NEGATIVE
Hyaline Cast: NONE SEEN /LPF
Nitrite: POSITIVE — AB
Specific Gravity, Urine: 1.022 (ref 1.001–1.03)
WBC, UA: >=60 /HPF — AB (ref 0–5)
pH: 5.5 (ref 5.0–8.0)

## 2020-04-06 LAB — HEMOGLOBIN A1C
Hgb A1c MFr Bld: 4.9 % of total Hgb (ref <5.7)
Mean Plasma Glucose: 94 (calc)
eAG (mmol/L): 5.2 (calc)

## 2020-04-07 LAB — COMPLETE METABOLIC PANEL WITH GFR
AG Ratio: 1.5 (calc) (ref 1.0–2.5)
ALT: 11 U/L (ref 6–29)
AST: 22 U/L (ref 10–35)
Albumin: 4.4 g/dL (ref 3.6–5.1)
Alkaline phosphatase (APISO): 61 U/L (ref 37–153)
BUN/Creatinine Ratio: 11 (calc) (ref 6–22)
BUN: 19 mg/dL (ref 7–25)
CO2: 17 mmol/L — ABNORMAL LOW (ref 20–32)
Calcium: 9.7 mg/dL (ref 8.6–10.4)
Chloride: 99 mmol/L (ref 98–110)
Creat: 1.68 mg/dL — ABNORMAL HIGH (ref 0.60–0.93)
GFR, Est African American: 34 mL/min/{1.73_m2} — ABNORMAL LOW (ref >=60)
GFR, Est Non African American: 29 mL/min/{1.73_m2} — ABNORMAL LOW (ref >=60)
Globulin: 3 g/dL (calc) (ref 1.9–3.7)
Glucose, Bld: 91 mg/dL (ref 65–99)
Potassium: 4.9 mmol/L (ref 3.5–5.3)
Sodium: 136 mmol/L (ref 135–146)
Total Bilirubin: 0.6 mg/dL (ref 0.2–1.2)
Total Protein: 7.4 g/dL (ref 6.1–8.1)

## 2020-04-08 ENCOUNTER — Other Ambulatory Visit: Payer: Self-pay | Admitting: Internal Medicine

## 2020-04-08 ENCOUNTER — Encounter: Payer: Self-pay | Admitting: Internal Medicine

## 2020-04-08 MED ORDER — CIPROFLOXACIN HCL 500 MG PO TABS
500.0000 mg | ORAL_TABLET | Freq: Two times a day (BID) | ORAL | 0 refills | Status: AC
Start: 2020-04-08 — End: 2020-04-18

## 2020-04-10 ENCOUNTER — Encounter: Payer: Self-pay | Admitting: Internal Medicine

## 2020-04-10 DIAGNOSIS — J9611 Chronic respiratory failure with hypoxia: Secondary | ICD-10-CM | POA: Insufficient documentation

## 2020-04-10 NOTE — Assessment & Plan Note (Signed)
stable overall by history and exam, recent data reviewed with pt, and pt to continue medical treatment as before,  to f/u any worsening symptoms or concerns  

## 2020-04-10 NOTE — Assessment & Plan Note (Signed)
[place] for increased aricept 10 qd, declines namenda or neurology or MRI for now

## 2020-04-10 NOTE — Assessment & Plan Note (Signed)
To cont 2L [place] qhs

## 2020-04-10 NOTE — Assessment & Plan Note (Signed)

## 2020-04-11 DIAGNOSIS — J449 Chronic obstructive pulmonary disease, unspecified: Secondary | ICD-10-CM | POA: Diagnosis not present

## 2020-04-11 DIAGNOSIS — J441 Chronic obstructive pulmonary disease with (acute) exacerbation: Secondary | ICD-10-CM | POA: Diagnosis not present

## 2020-04-11 DIAGNOSIS — J438 Other emphysema: Secondary | ICD-10-CM | POA: Diagnosis not present

## 2020-04-22 ENCOUNTER — Telehealth: Payer: Self-pay | Admitting: Acute Care

## 2020-04-22 NOTE — Telephone Encounter (Signed)
[facility] patient LMTCB X1 

## 2020-04-25 ENCOUNTER — Telehealth: Payer: Self-pay | Admitting: Internal Medicine

## 2020-04-25 MED ORDER — CEPHALEXIN 500 MG PO CAPS: 500.0000 mg | ORAL_CAPSULE | Freq: Three times a day (TID) | ORAL | 0 refills | Status: AC

## 2020-04-25 NOTE — Telephone Encounter (Signed)
Ok done erx 

## 2020-04-25 NOTE — Telephone Encounter (Signed)
-----   Message from Lucinda Dell, RN sent at 04/25/2020  1:49 PM EDT ----- Did you send the antibiotic?

## 2020-04-26 NOTE — Telephone Encounter (Signed)
lmtcb for [employer]. Will sign off on message as this is the second unsuccessful attempt to reach party with no return call. Per protocol, will close encounter.

## 2020-04-28 DIAGNOSIS — H35033 Hypertensive retinopathy, bilateral: Secondary | ICD-10-CM | POA: Diagnosis not present

## 2020-04-28 DIAGNOSIS — Z961 Presence of intraocular lens: Secondary | ICD-10-CM | POA: Diagnosis not present

## 2020-04-28 DIAGNOSIS — H524 Presbyopia: Secondary | ICD-10-CM | POA: Diagnosis not present

## 2020-04-28 DIAGNOSIS — H40013 Open angle with borderline findings, low risk, bilateral: Secondary | ICD-10-CM | POA: Diagnosis not present

## 2020-04-28 LAB — HM DIABETES EYE EXAM

## 2020-04-29 ENCOUNTER — Telehealth: Payer: Self-pay | Admitting: Acute Care

## 2020-04-29 NOTE — Telephone Encounter (Signed)
Called and spoke with Andrea Larson who states they got something from [employer] stating that patient needs to be re-qualifed for her oxygen.  Called [employer] and spoke with Darnelle Maffucci who states that patient does need to be walked at next OV for her oxygen. OV notes have been modified to say that she needs to be walked. Called Andrea Larson back to let her know and she expressed understanding. They will keep appointment in November. Nothing further needed at this time.

## 2020-05-02 ENCOUNTER — Other Ambulatory Visit: Payer: Self-pay

## 2020-05-02 ENCOUNTER — Encounter (HOSPITAL_COMMUNITY): Payer: Self-pay

## 2020-05-02 ENCOUNTER — Emergency Department (HOSPITAL_COMMUNITY)
Admission: EM | Admit: 2020-05-02 | Discharge: 2020-05-02 | Disposition: A | Discharge status: Home / Self Care | Payer: Medicare HMO | Attending: Emergency Medicine | Admitting: Emergency Medicine

## 2020-05-02 DIAGNOSIS — Z5321 Procedure and treatment not carried out due to patient leaving prior to being seen by health care provider: Secondary | ICD-10-CM | POA: Insufficient documentation

## 2020-05-02 DIAGNOSIS — R197 Diarrhea, unspecified: Secondary | ICD-10-CM | POA: Insufficient documentation

## 2020-05-02 LAB — COMPREHENSIVE METABOLIC PANEL
ALT: 12 U/L (ref 0–44)
AST: 16 U/L (ref 15–41)
Albumin: 3.2 g/dL — ABNORMAL LOW (ref 3.5–5.0)
Alkaline Phosphatase: 50 U/L (ref 38–126)
Anion gap: 10 (ref 5–15)
BUN: 21 mg/dL (ref 8–23)
CO2: 21 mmol/L — ABNORMAL LOW (ref 22–32)
Calcium: 8.5 mg/dL — ABNORMAL LOW (ref 8.9–10.3)
Chloride: 98 mmol/L (ref 98–111)
Creatinine, Ser: 1.25 mg/dL — ABNORMAL HIGH (ref 0.44–1.00)
GFR, Estimated: 45 mL/min — ABNORMAL LOW (ref >60)
Glucose, Bld: 103 mg/dL — ABNORMAL HIGH (ref 70–99)
Potassium: 3.5 mmol/L (ref 3.5–5.1)
Sodium: 129 mmol/L — ABNORMAL LOW (ref 135–145)
Total Bilirubin: 0.8 mg/dL (ref 0.3–1.2)
Total Protein: 7 g/dL (ref 6.5–8.1)

## 2020-05-02 LAB — CBC
HCT: 40.5 % (ref 36.0–46.0)
Hemoglobin: 13.6 g/dL (ref 12.0–15.0)
MCH: 32.6 pg (ref 26.0–34.0)
MCHC: 33.6 g/dL (ref 30.0–36.0)
MCV: 97.1 fL (ref 80.0–100.0)
Platelets: 333 10*3/uL (ref 150–400)
RBC: 4.17 MIL/uL (ref 3.87–5.11)
RDW: 12.8 % (ref 11.5–15.5)
WBC: 12.1 10*3/uL — ABNORMAL HIGH (ref 4.0–10.5)
nRBC: 0 % (ref 0.0–0.2)

## 2020-05-02 LAB — LIPASE, BLOOD: Lipase: 24 U/L (ref 11–51)

## 2020-05-02 NOTE — ED Triage Notes (Signed)
Per EMS- Patient reports diarrhea x 4 days.

## 2020-05-05 ENCOUNTER — Encounter: Payer: Self-pay | Admitting: Internal Medicine

## 2020-05-12 ENCOUNTER — Other Ambulatory Visit: Payer: Self-pay | Admitting: Internal Medicine

## 2020-05-12 DIAGNOSIS — J441 Chronic obstructive pulmonary disease with (acute) exacerbation: Secondary | ICD-10-CM | POA: Diagnosis not present

## 2020-05-12 DIAGNOSIS — J438 Other emphysema: Secondary | ICD-10-CM | POA: Diagnosis not present

## 2020-05-12 DIAGNOSIS — J449 Chronic obstructive pulmonary disease, unspecified: Secondary | ICD-10-CM | POA: Diagnosis not present

## 2020-05-13 ENCOUNTER — Other Ambulatory Visit: Payer: Self-pay | Admitting: Internal Medicine

## 2020-05-14 ENCOUNTER — Other Ambulatory Visit: Payer: Self-pay | Admitting: Pulmonary Disease

## 2020-05-15 ENCOUNTER — Other Ambulatory Visit: Payer: Self-pay | Admitting: Internal Medicine

## 2020-05-17 ENCOUNTER — Encounter: Payer: Self-pay | Admitting: Internal Medicine

## 2020-05-17 DIAGNOSIS — F1027 Alcohol dependence with alcohol-induced persisting dementia: Secondary | ICD-10-CM

## 2020-05-17 NOTE — Telephone Encounter (Signed)
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

## 2020-05-18 ENCOUNTER — Encounter: Payer: Self-pay | Admitting: Acute Care

## 2020-05-18 ENCOUNTER — Other Ambulatory Visit: Payer: Self-pay

## 2020-05-18 ENCOUNTER — Ambulatory Visit (INDEPENDENT_AMBULATORY_CARE_PROVIDER_SITE_OTHER): Payer: Medicare HMO | Admitting: Acute Care

## 2020-05-18 VITALS — BP 120/80 | HR 87 | Temp 97.3°F | Ht 62.0 in | Wt 100.6 lb

## 2020-05-18 DIAGNOSIS — G4734 Idiopathic sleep related nonobstructive alveolar hypoventilation: Secondary | ICD-10-CM | POA: Diagnosis not present

## 2020-05-18 DIAGNOSIS — J449 Chronic obstructive pulmonary disease, unspecified: Secondary | ICD-10-CM | POA: Diagnosis not present

## 2020-05-18 NOTE — Patient Instructions (Signed)
It is good to see you today. We will order a repeat O&O. We will place the order through [employer] and have them deliver to your home.  We will call you with the results. We will follow up with [employer] regarding the order for a walk test. ( which should not be required for nocturnal oxygen use) Follow up as needed.  Please contact office for sooner follow up if symptoms do not improve or worsen or seek emergency care

## 2020-05-18 NOTE — Progress Notes (Signed)
History of Present Illness Andrea Larson is a 74 y.o. female with COPD and nocturnal hypoxemia. . She is followed by Dr. Vaughan Browner.     05/18/2020  Pt. Presents per [facility] for a walk for nocturnal oxygen. This patient does not need oxygen for ambulatory dyspnea or hypoxemia. . She needs it only for nocturnal hypoxemia.  These are two totally different diagnoses.  Patient's last overnight oximetry was done 05/2019. We will order another one as it has been a year. Last results showedDuration of study 3 and half hours Time spent less than 88% - 35 minutes, oxygen desaturation index 5.47 Nadir O2 sat of 85%. This qualifies her for nocturnal oxygen.   I have called Melissa at [facility] to see if they can explain the rationale for a walk for nocturnal hypoxemia. I will await their return call.    Pt. States she continues to benefit from the overnight oxygen. She has improved memory, and her overall functioning is at a much higher level when she wears her oxygen.  She is at her baseline in regard to her COPD. She is compliant with her Trelegy, and Xopenex.   Test Results:  Imaging: CT chest 08/13/16- Lung-RADS Category 1 CT chest abdomen pelvis 03/27/2019-mild emphysema. Acute 11th rib fracture. No acute findings in abdomen pelvis.   PFTs 09/06/16 FVC 1.37 (54%), FEV1 0.70 (36%), F/F 51, TLC 110% , RV/TLC 141%, DLCO 58% Severe obstruction with positive bronchodilator response, airtrapping, moderate diffusion defect.  Labs: CBC 03/27/2019-WBC 7.3, eos 0% Alpha-1 antitrypsin 03/18/2018-190, PI MM  Cardiac: Echo 09/11/15-EF 86-57%, grade 1 diastolic dysfunction. Mild aortic stenosis.  Stress test 08/30/16- Low risk study  Sleep: Overnight oximetry 05/13/2019  Performed on room air. Duration of study 3 and half hours Time spent less than 88% - 35 minutes, oxygen desaturation index 5.47 Nadir O2 sat of 85%    CBC Latest Ref Rng & Units 05/02/2020 04/05/2020 03/27/2019  WBC 4.0 -  10.5 K/uL 12.1(H) 6.0 7.3  Hemoglobin 12.0 - 15.0 g/dL 13.6 14.3 13.7  Hematocrit 36 - 46 % 40.5 43.0 43.0  Platelets 150 - 400 K/uL 333 398 247    BMP Latest Ref Rng & Units 05/02/2020 04/05/2020 03/27/2019  Glucose 70 - 99 mg/dL 103(H) 91 104(H)  BUN 8 - 23 mg/dL 21 19 15   Creatinine 0.44 - 1.00 mg/dL 1.25(H) 1.68(H) 1.22(H)  BUN/Creat Ratio 6 - 22 (calc) - 11 -  Sodium 135 - 145 mmol/L 129(L) 136 137  Potassium 3.5 - 5.1 mmol/L 3.5 4.9 4.4  Chloride 98 - 111 mmol/L 98 99 102  CO2 22 - 32 mmol/L 21(L) 17(L) 25  Calcium 8.9 - 10.3 mg/dL 8.5(L) 9.7 9.0    BNP No results found for: BNP  ProBNP    Component Value Date/Time   PROBNP 535.70 (H) 10/28/2012 1418    PFT    Component Value Date/Time   FEV1PRE 0.50 09/06/2016 1157   FEV1POST 0.70 09/06/2016 1157   FVCPRE 1.13 09/06/2016 1157   FVCPOST 1.37 09/06/2016 1157   TLC 5.11 09/06/2016 1157   DLCOUNC 11.73 09/06/2016 1157   PREFEV1FVCRT 44 09/06/2016 1157   PSTFEV1FVCRT 51 09/06/2016 1157    No results found.   Past medical hx Past Medical History:  Diagnosis Date  . Allergic rhinitis   . Allergic rhinitis, cause unspecified 09/21/2011  . Aortic stenosis 08/23/2016  . Arthritis   . Asthma   . Asthma 09/21/2011  . Colon polyps   . COPD (chronic obstructive  pulmonary disease) ([place])    mild   . Coronary artery calcification 08/23/2016  . Hyperlipidemia   . UTI (lower urinary tract infection)      Social History   Tobacco Use  . Smoking status: Former Smoker    Packs/day: 0.75    Years: 55.00    Pack years: 41.25    Types: Cigarettes    Quit date: 04/18/2015    Years since quitting: 5.0  . Smokeless tobacco: Never Used  Vaping Use  . Vaping Use: Never used  Substance Use Topics  . Alcohol use: Yes    Alcohol/week: 0.0 standard drinks    Comment: occ.   . Drug use: No    Andrea Larson reports that she quit smoking about 5 years ago. Her smoking use included cigarettes. She has a 41.25 pack-year smoking  history. She has never used smokeless tobacco. She reports current alcohol use. She reports that she does not use drugs.  Tobacco Cessation: Former smoker , quit 2016, with a 41.25 pack year smoking history  Past surgical hx, Family hx, Social hx all reviewed.  Current Outpatient Medications on File Prior to Visit  Medication Sig  . acetaminophen (TYLENOL) 325 MG tablet Take 650 mg by mouth every 6 (six) hours as needed for mild pain or headache.  . ALPRAZolam (XANAX) 0.25 MG tablet TAKE (1) TABLET TWICE A DAY AS NEEDED.  Marland Kitchen buPROPion (WELLBUTRIN XL) 150 MG 24 hr tablet Take 1 tablet (150 mg total) by mouth daily.  . diphenoxylate-atropine (LOMOTIL) 2.5-0.025 MG tablet TAKE 1 TABLET FOUR TIMES DAILY AS NEEDED FOR DIARRHEA/LOOSE STOOLS.  Marland Kitchen donepezil (ARICEPT) 10 MG tablet Take 1 tablet (10 mg total) by mouth at bedtime.  . feeding supplement, ENSURE ENLIVE, (ENSURE ENLIVE) LIQD Take 237 mLs by mouth 3 (three) times daily between meals.  Marland Kitchen FLUoxetine (PROZAC) 40 MG capsule TAKE (1) CAPSULE DAILY.  Marland Kitchen Fluticasone-Umeclidin-Vilant (TRELEGY ELLIPTA) 200-62.5-25 MCG/INH AEPB Inhale 1 puff into the lungs daily.  Marland Kitchen levalbuterol (XOPENEX HFA) 45 MCG/ACT inhaler USE 2 PUFFS EVERY 6 HOURS AS NEEDED FOR WHEEZING. **SHAKE WELL**  . levalbuterol (XOPENEX) 0.63 MG/3ML nebulizer solution USE 1 VIAL VIA NEBULIZER EVERY 6 HOURS AS NEEDED FOR WHEEZING OR SHORTNESS OF BREATH.  . montelukast (SINGULAIR) 10 MG tablet Take 1 tablet (10 mg total) by mouth daily.  . ondansetron (ZOFRAN) 8 MG tablet TAKE 1 TABLET EVERY 8 HOURS AS NEEDED FOR NAUSEA AND VOMITING.  Marland Kitchen Respiratory Therapy Supplies (FLUTTER) DEVI Use as directed  . rosuvastatin (CRESTOR) 20 MG tablet Take 1 tablet (20 mg total) by mouth daily.  . Tiotropium Bromide Monohydrate (SPIRIVA RESPIMAT) 2.5 MCG/ACT AERS USE 1 PUFF DAILY AS DIRECTED Annual appt due in June must see provider for future refills  . VENTOLIN HFA 108 (90 Base) MCG/ACT inhaler USE 2 PUFFS  EVERY 6 HOURS AS NEEDED FOR WHEEZING OR SHORTNESS OF BREATH.   No current facility-administered medications on file prior to visit.     Allergies  Allergen Reactions  . Lipitor [Atorvastatin] Other (See Comments)    Myalgias    Review Of Systems:  Constitutional:   No  weight loss, night sweats,  Fevers, chills, fatigue, or  lassitude.  HEENT:   No headaches,  Difficulty swallowing,  Tooth/dental problems, or  Sore throat,                No sneezing, itching, ear ache, nasal congestion, post nasal drip,   CV:  No chest pain,  Orthopnea, PND, swelling in  lower extremities, anasarca, dizziness, palpitations, syncope.   GI  No heartburn, indigestion, abdominal pain, nausea, vomiting, diarrhea, change in bowel habits, loss of appetite, bloody stools.   Resp: No shortness of breath with exertion or at rest.  No excess mucus, no productive cough,  No non-productive cough,  No coughing up of blood.  No change in color of mucus.  No wheezing.  No chest wall deformity  Skin: no rash or lesions.  GU: no dysuria, change in color of urine, no urgency or frequency.  No flank pain, no hematuria   MS:  No joint pain or swelling.  No decreased range of motion.  No back pain.  Psych:  No change in mood or affect. No depression or anxiety.  No memory loss.   Vital Signs BP 120/80 (Cuff Size: Normal)   Pulse 87   Temp (!) 97.3 F (36.3 C) (Oral)   Ht 5\' 2"  (1.575 m)   Wt 100 lb 9.6 oz (45.6 kg)   SpO2 96%   BMI 18.40 kg/m    Physical Exam:  General- No distress,  A&Ox3, pleasant ENT: No sinus tenderness, TM clear, pale nasal mucosa, no oral exudate,no post nasal drip, no LAN Cardiac: S1, S2, regular rate and rhythm, no murmur Chest: No wheeze/ rales/ dullness; no accessory muscle use, no nasal flaring, no sternal retractions Abd.: Soft Non-tender, ND, BS + , Body mass index is 18.4 kg/m. Ext: No clubbing cyanosis, edema Neuro:  normal strength Skin: No rashes, warm and dry Psych:  normal mood and behavior   Assessment/Plan  Nocturnal hypoxemia Time spent less than 88% - 35 minutes, oxygen desaturation index 5.47 Nadir O2 sat of 85% Continues to benefit from therapy  Plan Continue wearing nocturnal oxygen at 2 L [place] We will try to get the results of your 01/2020 O&O to confirm need for nocturnal oxygen We will follow up with [facility] regarding the order for a walk test. ( which should not be required for nocturnal oxygen use, only for ambulatory desaturations) Follow up in 2 weeks with televisit to ensure [facility] has sent the results of the 01/2020 O&O Please contact office for sooner follow up if symptoms do not improve or worsen or seek emergency care   Magdalen Spatz, NP 05/18/2020  4:44 PM

## 2020-05-19 DIAGNOSIS — Z1231 Encounter for screening mammogram for malignant neoplasm of breast: Secondary | ICD-10-CM | POA: Diagnosis not present

## 2020-05-19 LAB — HM MAMMOGRAPHY

## 2020-05-19 NOTE — Telephone Encounter (Signed)
Patients son calling stating the patient has been having issues with remember things and forgetting whole conversations and getting her children confused

## 2020-05-20 ENCOUNTER — Encounter: Payer: Self-pay | Admitting: Internal Medicine

## 2020-06-03 ENCOUNTER — Other Ambulatory Visit: Payer: Self-pay

## 2020-06-03 ENCOUNTER — Emergency Department (HOSPITAL_COMMUNITY)
Admission: EM | Admit: 2020-06-03 | Discharge: 2020-06-04 | Disposition: A | Discharge status: Home / Self Care | Payer: Medicare HMO | Attending: Emergency Medicine | Admitting: Emergency Medicine

## 2020-06-03 ENCOUNTER — Encounter (HOSPITAL_COMMUNITY): Payer: Self-pay | Admitting: Emergency Medicine

## 2020-06-03 DIAGNOSIS — Z79899 Other long term (current) drug therapy: Secondary | ICD-10-CM | POA: Diagnosis not present

## 2020-06-03 DIAGNOSIS — N183 Chronic kidney disease, stage 3 unspecified: Secondary | ICD-10-CM | POA: Insufficient documentation

## 2020-06-03 DIAGNOSIS — J449 Chronic obstructive pulmonary disease, unspecified: Secondary | ICD-10-CM | POA: Diagnosis not present

## 2020-06-03 DIAGNOSIS — R410 Disorientation, unspecified: Secondary | ICD-10-CM

## 2020-06-03 DIAGNOSIS — I2510 Atherosclerotic heart disease of native coronary artery without angina pectoris: Secondary | ICD-10-CM | POA: Diagnosis not present

## 2020-06-03 DIAGNOSIS — G319 Degenerative disease of nervous system, unspecified: Secondary | ICD-10-CM | POA: Diagnosis not present

## 2020-06-03 DIAGNOSIS — J320 Chronic maxillary sinusitis: Secondary | ICD-10-CM | POA: Diagnosis not present

## 2020-06-03 DIAGNOSIS — I129 Hypertensive chronic kidney disease with stage 1 through stage 4 chronic kidney disease, or unspecified chronic kidney disease: Secondary | ICD-10-CM | POA: Diagnosis not present

## 2020-06-03 DIAGNOSIS — R4182 Altered mental status, unspecified: Secondary | ICD-10-CM | POA: Diagnosis present

## 2020-06-03 DIAGNOSIS — J0101 Acute recurrent maxillary sinusitis: Secondary | ICD-10-CM | POA: Diagnosis not present

## 2020-06-03 DIAGNOSIS — R404 Transient alteration of awareness: Secondary | ICD-10-CM | POA: Insufficient documentation

## 2020-06-03 DIAGNOSIS — F0390 Unspecified dementia without behavioral disturbance: Secondary | ICD-10-CM | POA: Insufficient documentation

## 2020-06-03 DIAGNOSIS — Z87891 Personal history of nicotine dependence: Secondary | ICD-10-CM | POA: Diagnosis not present

## 2020-06-03 LAB — CBC WITH DIFFERENTIAL/PLATELET
Abs Immature Granulocytes: 0.05 10*3/uL (ref 0.00–0.07)
Basophils Absolute: 0.1 10*3/uL (ref 0.0–0.1)
Basophils Relative: 0 %
Eosinophils Absolute: 0.1 10*3/uL (ref 0.0–0.5)
Eosinophils Relative: 1 %
HCT: 40.7 % (ref 36.0–46.0)
Hemoglobin: 12.9 g/dL (ref 12.0–15.0)
Immature Granulocytes: 0 %
Lymphocytes Relative: 11 %
Lymphs Abs: 1.4 10*3/uL (ref 0.7–4.0)
MCH: 31.5 pg (ref 26.0–34.0)
MCHC: 31.7 g/dL (ref 30.0–36.0)
MCV: 99.3 fL (ref 80.0–100.0)
Monocytes Absolute: 1.2 10*3/uL — ABNORMAL HIGH (ref 0.1–1.0)
Monocytes Relative: 10 %
Neutro Abs: 9.5 10*3/uL — ABNORMAL HIGH (ref 1.7–7.7)
Neutrophils Relative %: 78 %
Platelets: 500 10*3/uL — ABNORMAL HIGH (ref 150–400)
RBC: 4.1 MIL/uL (ref 3.87–5.11)
RDW: 12.8 % (ref 11.5–15.5)
WBC: 12.2 10*3/uL — ABNORMAL HIGH (ref 4.0–10.5)
nRBC: 0 % (ref 0.0–0.2)

## 2020-06-03 LAB — COMPREHENSIVE METABOLIC PANEL
ALT: 12 U/L (ref 0–44)
AST: 16 U/L (ref 15–41)
Albumin: 2.8 g/dL — ABNORMAL LOW (ref 3.5–5.0)
Alkaline Phosphatase: 57 U/L (ref 38–126)
Anion gap: 10 (ref 5–15)
BUN: 12 mg/dL (ref 8–23)
CO2: 25 mmol/L (ref 22–32)
Calcium: 9.2 mg/dL (ref 8.9–10.3)
Chloride: 103 mmol/L (ref 98–111)
Creatinine, Ser: 0.91 mg/dL (ref 0.44–1.00)
GFR, Estimated: >60 mL/min (ref >60)
Glucose, Bld: 103 mg/dL — ABNORMAL HIGH (ref 70–99)
Potassium: 3.5 mmol/L (ref 3.5–5.1)
Sodium: 138 mmol/L (ref 135–145)
Total Bilirubin: 0.7 mg/dL (ref 0.3–1.2)
Total Protein: 6.9 g/dL (ref 6.5–8.1)

## 2020-06-03 MED ORDER — NAPROXEN 250 MG PO TABS
500.0000 mg | ORAL_TABLET | Freq: Once | ORAL | Status: AC
  Administered 2020-06-03: 500 mg via ORAL
  Filled 2020-06-03: qty 2

## 2020-06-03 NOTE — ED Notes (Signed)
Pt not in room, will obtain VS when pt is in room

## 2020-06-03 NOTE — ED Triage Notes (Signed)
Pt brought to ED by her son.  Pt not eating and has had altered mental status .  Pt c/o pain in mid back  Denies any other problems

## 2020-06-03 NOTE — ED Notes (Signed)
Pt brought back to room. States she does not know why she is here. Family at bedside states roommate has reported confusion and memory problems for the past year. Was diagnosed with dementia by PCP and started on unknown medication at that time. Family states over past week pt's memory and confusion has worsen to where she is forgetting conversations and people. Pt denies fever/chills/urinary s/s. Pt alert and oriented x3, disoriented to situation.

## 2020-06-03 NOTE — ED Provider Notes (Signed)
[facility] Provider Note   CSN: [id number] Arrival date & time: 06/03/20  1854     History Chief Complaint  Patient presents with   Altered Mental Status    Andrea Larson is a 74 y.o. female.  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  Is in the ED with her son who states that patient has had issues with memory.  Has been confusing family members for other people and is often unable to recall recent events.  Son does not feel that patient has been drinking heavily recently; would consider her to be more sober over the past year.  She was brought to the ED tonight because son noticed that these issues were occurring more frequently over the past 2 weeks.  She has not had any recent falls or trauma, facial drooping, unilateral extremity weakness or numbness.  Is prescribed Aricept, but PCP notes suggest initial resistance to neurology referral as well as MRI or Namenda.  She does now have a neurology appointment scheduled for the end of January.       Past Medical History:  Diagnosis Date   Allergic rhinitis    Allergic rhinitis, cause unspecified 09/21/2011   Aortic stenosis 08/23/2016   Arthritis    Asthma    Asthma 09/21/2011   Colon polyps    COPD (chronic obstructive pulmonary disease) ([facility])    mild    Coronary artery calcification 08/23/2016   Hyperlipidemia    UTI (lower urinary tract infection)     Patient Active Problem List   Diagnosis Date Noted   Chronic hypoxemic respiratory failure ([place]) 04/10/2020   Dementia ([place]) 06/24/2018   Memory loss 06/24/2018   Weight loss 06/24/2018   Peripheral edema 05/21/2018   Gait disorder 05/21/2018   C. difficile colitis 05/06/2018   Clostridium difficile diarrhea    Acute diarrhea 05/02/2018   Diarrhea 04/28/2018    CKD (chronic kidney disease), stage III ([place]) 04/28/2018   Diarrhea due to drug 04/21/2018   Malnutrition of moderate degree 04/13/2018   ARF (acute renal failure) ([place]) 04/11/2018   Right wrist pain 04/11/2018   Essential hypertension 04/11/2018   Gallstones 04/11/2018   Hypokalemia 03/21/2018   UTI (urinary tract infection) 02/19/2018   AKI (acute kidney injury) ([place]) 02/19/2018   Coronary artery calcification 08/23/2016   Aortic stenosis 08/23/2016   Chest pain 07/20/2016   Hyperglycemia 07/20/2016   Insomnia 08/18/2015   GERD (gastroesophageal reflux disease) 08/18/2015   Acute bronchitis 02/15/2015   COPD exacerbation ([place]) 02/15/2015   Tachycardia 10/28/2012   Back pain 10/28/2012   Asthma 09/21/2011   Allergic rhinitis 09/21/2011   Colon polyps 09/21/2011   Fatigue 09/21/2011   Preventative health care 09/15/2011   SYNCOPE 06/20/2010   DEGENERATIVE JOINT DISEASE, CERVICAL SPINE 10/21/2008   Overweight(278.02) 02/10/2008   ANXIETY DEPRESSION 02/10/2008   COPD (chronic obstructive pulmonary disease) ([place]) 02/10/2008   EMPHYSEMA NEC 01/30/2007   Hyperlipidemia 12/31/2006    Past Surgical History:  Procedure Laterality Date   DENTAL SURGERY     pilonidial cyst     TONSILLECTOMY       OB History   No obstetric history on file.     Family History  Problem Relation Age of Onset   Alcohol abuse Other    Arthritis Other    Heart disease Other    Stroke  Other    Hypertension Other    Hypertension Father    Stroke Father    Atrial fibrillation Brother    Colon cancer Neg Hx     Social History   Tobacco Use   Smoking status: Former Smoker    Packs/day: 0.75    Years: 55.00    Pack years: 41.25    Types: Cigarettes    Quit date: 04/18/2015    Years since quitting: 5.1   Smokeless tobacco: Never Used  Vaping Use   Vaping Use: Never used  Substance Use Topics   Alcohol use: Yes    Alcohol/week: 0.0  standard drinks    Comment: occ.    Drug use: No    Home Medications Prior to Admission medications   Medication Sig Start Date End Date Taking? Authorizing Provider  acetaminophen (TYLENOL) 325 MG tablet Take 650 mg by mouth every 6 (six) hours as needed for mild pain or headache.   Yes [provider]  ALPRAZolam Duanne Moron) 0.25 MG tablet TAKE (1) TABLET TWICE A DAY AS NEEDED. 04/05/20  Yes Biagio Borg, MD  buPROPion (WELLBUTRIN XL) 150 MG 24 hr tablet Take 1 tablet (150 mg total) by mouth daily. 04/05/20  Yes Biagio Borg, MD  diphenoxylate-atropine (LOMOTIL) 2.5-0.025 MG tablet TAKE 1 TABLET FOUR TIMES DAILY AS NEEDED FOR DIARRHEA/LOOSE STOOLS. 05/12/20  Yes Biagio Borg, MD  donepezil (ARICEPT) 10 MG tablet Take 1 tablet (10 mg total) by mouth at bedtime. 04/05/20  Yes Biagio Borg, MD  feeding supplement, ENSURE ENLIVE, (ENSURE ENLIVE) LIQD Take 237 mLs by mouth 3 (three) times daily between meals. 04/21/18  Yes Biagio Borg, MD  FLUoxetine (PROZAC) 40 MG capsule TAKE (1) CAPSULE DAILY. 04/05/20  Yes Biagio Borg, MD  Fluticasone-Umeclidin-Vilant (TRELEGY ELLIPTA) 200-62.5-25 MCG/INH AEPB Inhale 1 puff into the lungs daily. 09/17/19  Yes Mannam, Praveen, MD  levalbuterol (XOPENEX HFA) 45 MCG/ACT inhaler USE 2 PUFFS EVERY 6 HOURS AS NEEDED FOR WHEEZING. **SHAKE WELL** 07/07/19  Yes Mannam, Praveen, MD  levalbuterol (XOPENEX) 0.63 MG/3ML nebulizer solution USE 1 VIAL VIA NEBULIZER EVERY 6 HOURS AS NEEDED FOR WHEEZING OR SHORTNESS OF BREATH. 04/15/19  Yes Mannam, Praveen, MD  montelukast (SINGULAIR) 10 MG tablet Take 1 tablet (10 mg total) by mouth daily. 04/05/20  Yes Biagio Borg, MD  ondansetron (ZOFRAN) 8 MG tablet TAKE 1 TABLET EVERY 8 HOURS AS NEEDED FOR NAUSEA AND VOMITING. 05/16/20  Yes Biagio Borg, MD  Respiratory Therapy Supplies (FLUTTER) DEVI Use as directed 08/19/17  Yes Mannam, Praveen, MD  rosuvastatin (CRESTOR) 20 MG tablet Take 1 tablet (20 mg total) by mouth daily.  04/05/20  Yes Biagio Borg, MD  Tiotropium Bromide Monohydrate (SPIRIVA RESPIMAT) 2.5 MCG/ACT AERS USE 1 PUFF DAILY AS DIRECTED Annual appt due in June must see provider for future refills 10/27/19  Yes Biagio Borg, MD  VENTOLIN HFA 108 (90 Base) MCG/ACT inhaler USE 2 PUFFS EVERY 6 HOURS AS NEEDED FOR WHEEZING OR SHORTNESS OF BREATH. Patient taking differently: Inhale 2 puffs into the lungs every 6 (six) hours as needed for wheezing or shortness of breath.  05/16/20  Yes Mannam, Praveen, MD    Allergies    Lipitor [atorvastatin]  Review of Systems   Review of Systems  Ten systems reviewed and are negative for acute change, except as noted in the HPI.    Physical Exam Updated Vital Signs BP 132/86    Pulse 94  Temp 98.6 F (37 C)    Resp 17    Ht 5\' 2"  (1.575 m)    Wt 47.6 kg    SpO2 97%    BMI 19.20 kg/m   Physical Exam Vitals and nursing note reviewed.  Constitutional:      General: She is not in acute distress.    Appearance: She is well-developed. She is not diaphoretic.     Comments: Nontoxic-appearing and in no acute distress  HENT:     Head: Normocephalic and atraumatic.     Right Ear: External ear normal.     Left Ear: External ear normal.     Mouth/Throat:     Mouth: Mucous membranes are moist.  Eyes:     General: No scleral icterus.    Extraocular Movements: Extraocular movements intact.     Conjunctiva/sclera: Conjunctivae normal.     Pupils: Pupils are equal, round, and reactive to light.     Comments: No appreciable nystagmus  Cardiovascular:     Rate and Rhythm: Normal rate and regular rhythm.     Pulses: Normal pulses.  Pulmonary:     Effort: Pulmonary effort is normal. No respiratory distress.     Comments: Respirations even and unlabored Musculoskeletal:        General: Normal range of motion.     Cervical back: Normal range of motion.  Skin:    General: Skin is warm and dry.     Coloration: Skin is not pale.     Findings: No erythema or rash.    Neurological:     Mental Status: She is alert and oriented to person, place, and time.     Coordination: Coordination normal.     Comments: Alert and oriented to self, place, year, president.  GCS 15.  Answers questions appropriately and follows two-point commands.  Speech is goal oriented. No cranial nerve deficits appreciated; symmetric eyebrow raise, no facial drooping, tongue midline. Patient has equal grip strength bilaterally with 5/5 strength against resistance in all major muscle groups bilaterally. Sensation to light touch intact. Patient moves extremities without ataxia. No pronator drift or asterixis.   Psychiatric:        Behavior: Behavior normal.     ED Results / Procedures / Treatments   Labs (all labs ordered are listed, but only abnormal results are displayed) Labs Reviewed  CBC WITH DIFFERENTIAL/PLATELET - Abnormal; Notable for the following components:      Result Value   WBC 12.2 (*)    Platelets 500 (*)    Neutro Abs 9.5 (*)    Monocytes Absolute 1.2 (*)    All other components within normal limits  COMPREHENSIVE METABOLIC PANEL - Abnormal; Notable for the following components:   Glucose, Bld 103 (*)    Albumin 2.8 (*)    All other components within normal limits  URINALYSIS, ROUTINE W REFLEX MICROSCOPIC - Abnormal; Notable for the following components:   APPearance HAZY (*)    Ketones, ur 5 (*)    Protein, ur 30 (*)    Leukocytes,Ua MODERATE (*)    Non Squamous Epithelial 0-5 (*)    All other components within normal limits  ETHANOL  AMMONIA  RAPID URINE DRUG SCREEN, HOSP PERFORMED    EKG None  Radiology MR BRAIN WO CONTRAST  Result Date: 06/04/2020 CLINICAL DATA:  Initial evaluation for acute delirium. EXAM: MRI HEAD WITHOUT CONTRAST TECHNIQUE: Multiplanar, multiecho pulse sequences of the brain and surrounding structures were obtained without intravenous contrast. COMPARISON:  Prior CT from 03/27/2019 FINDINGS: Brain: Generalized age-related  cerebral atrophy. Patchy T2/FLAIR hyperintensity within the periventricular deep white matter both cerebral hemispheres most consistent with chronic small vessel ischemic disease, mild in nature. No abnormal foci of restricted diffusion to suggest acute or subacute ischemia. Gray-white matter differentiation maintained. No encephalomalacia to suggest chronic cortical infarction. No foci of susceptibility artifact to suggest acute or chronic intracranial hemorrhage. No mass lesion, midline shift or mass effect. No hydrocephalus or extra-axial fluid collection. Pituitary gland suprasellar region within normal limits. Midline structures intact. Vascular: Major intracranial vascular flow voids are maintained. Skull and upper cervical spine: Craniocervical junction normal. Bone marrow signal intensity within normal limits. No scalp soft tissue abnormality. Sinuses/Orbits: Patient status post bilateral ocular lens replacement. Globes and orbital soft tissues demonstrate no acute finding. Moderate mucosal thickening with internal air-fluid level within the left maxillary sinus noted, suggesting acute on chronic sinusitis. Paranasal sinuses are otherwise largely clear. No mastoid effusion. Inner ear structures grossly normal. Other: None. IMPRESSION: 1. No acute intracranial abnormality. 2. Age-related cerebral atrophy with mild chronic small vessel ischemic disease. 3. Acute on chronic left maxillary sinusitis. Electronically Signed   By: Jeannine Boga M.D.   On: 06/04/2020 01:52    Procedures Procedures (including critical care time)  Medications Ordered in ED Medications  naproxen (NAPROSYN) tablet 500 mg (500 mg Oral Given 06/03/20 2305)    ED Course  I have reviewed the triage vital signs and the nursing notes.  Pertinent labs & imaging results that were available during my care of the patient were reviewed by me and considered in my medical decision making (see chart for details).  Clinical  Course as of Jun 04 500  Sat Jun 04, 2020  0347 Patient providing urine specimen.  Son updated on MRI results.  Pending urinalysis.  Anticipate discharge once resulted.   [KH]  0501 Reassuring work-up discussed with patient and son.  No evidence of UTI.  Encouraged continued primary care follow-up in addition to neurology as scheduled.  Discharged in stable condition.   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  Son reports progressive decline over the past 2 weeks.  The patient has been alert and oriented during the entirety of her ED visit.  Does have some situational confusion regarding motivation for visit to the ED tonight.  Neurologic exam is nonfocal.  Symptoms evaluated with labs as well as brain MRI.  No evidence of acute or emergent process on work-up today.  The patient does have a neurology appointment scheduled for the end of January.  I have advised her to keep this appointment.  She is on Aricept for her dementia.  Per PCP note, has been resistant to Namenda.  Have encouraged patient to discuss her symptoms with her PCP to determine whether additional medications for her dementia may be indicated.  Return precautions discussed and provided.  Patient discharged in stable condition with no unaddressed concerns.   Final Clinical Impression(s) / ED Diagnoses Final diagnoses:  Transient confusion    Rx / DC Orders ED Discharge Orders    None       Antonietta Breach, PA-C 06/04/20 [id number]  Orpah Greek, MD 06/04/20 [phone]

## 2020-06-03 NOTE — ED Notes (Signed)
MRI currently on the phone with son, regarding MRI questions. Lab currently at bedside for blood draws.

## 2020-06-04 ENCOUNTER — Emergency Department (HOSPITAL_COMMUNITY): Payer: Medicare HMO

## 2020-06-04 DIAGNOSIS — G319 Degenerative disease of nervous system, unspecified: Secondary | ICD-10-CM | POA: Diagnosis not present

## 2020-06-04 DIAGNOSIS — J320 Chronic maxillary sinusitis: Secondary | ICD-10-CM | POA: Diagnosis not present

## 2020-06-04 DIAGNOSIS — J0101 Acute recurrent maxillary sinusitis: Secondary | ICD-10-CM | POA: Diagnosis not present

## 2020-06-04 DIAGNOSIS — R410 Disorientation, unspecified: Secondary | ICD-10-CM | POA: Diagnosis not present

## 2020-06-04 LAB — AMMONIA: Ammonia: 24 umol/L (ref 9–35)

## 2020-06-04 LAB — RAPID URINE DRUG SCREEN, HOSP PERFORMED
Amphetamines: NOT DETECTED
Barbiturates: NOT DETECTED
Benzodiazepines: POSITIVE — AB
Cocaine: NOT DETECTED
Opiates: NOT DETECTED
Tetrahydrocannabinol: NOT DETECTED

## 2020-06-04 LAB — URINALYSIS, ROUTINE W REFLEX MICROSCOPIC
Bacteria, UA: NONE SEEN
Bilirubin Urine: NEGATIVE
Glucose, UA: NEGATIVE mg/dL
Hgb urine dipstick: NEGATIVE
Ketones, ur: 5 mg/dL — AB
Nitrite: NEGATIVE
Protein, ur: 30 mg/dL — AB
Specific Gravity, Urine: 1.029 (ref 1.005–1.030)
pH: 5 (ref 5.0–8.0)

## 2020-06-04 LAB — ETHANOL: Alcohol, Ethyl (B): <10 mg/dL (ref <10)

## 2020-06-04 NOTE — ED Notes (Signed)
Patient verbalized understanding of dc instructions, vss, ambulatory with nad.   

## 2020-06-04 NOTE — Discharge Instructions (Signed)
Your evaluation in the emergency department today was reassuring.  We recommend continued follow-up with your primary care doctor as well as neurology as scheduled.  Continue your daily prescribed medications.  Return to the ED for new or concerning symptoms.

## 2020-06-04 NOTE — ED Notes (Signed)
Back to room at this time.

## 2020-06-04 NOTE — ED Notes (Signed)
Unable to give urine sample at this time. Given cup of water to facilitate urine specimen collection. Changed pt into hospital gown for MRI.

## 2020-06-04 NOTE — ED Notes (Signed)
Patient transported to MRI 

## 2020-06-06 ENCOUNTER — Encounter: Payer: Self-pay | Admitting: Acute Care

## 2020-06-06 ENCOUNTER — Ambulatory Visit (INDEPENDENT_AMBULATORY_CARE_PROVIDER_SITE_OTHER): Payer: Medicare HMO | Admitting: Acute Care

## 2020-06-06 ENCOUNTER — Other Ambulatory Visit: Payer: Self-pay

## 2020-06-06 ENCOUNTER — Ambulatory Visit: Payer: Medicare HMO | Admitting: Acute Care

## 2020-06-06 DIAGNOSIS — J449 Chronic obstructive pulmonary disease, unspecified: Secondary | ICD-10-CM | POA: Diagnosis not present

## 2020-06-06 DIAGNOSIS — G4734 Idiopathic sleep related nonobstructive alveolar hypoventilation: Secondary | ICD-10-CM

## 2020-06-06 NOTE — Progress Notes (Signed)
Virtual Visit via Video Note  I connected with Andrea Larson on 06/06/20 at 10:30 AM EST by a video enabled telemedicine application and verified that I am speaking with the correct person using two identifiers.  Location: Patient: At home Provider:  [place], [place], [place], [address]   I discussed the limitations of evaluation and management by telemedicine and the availability of in person appointments. The patient expressed understanding and agreed to proceed.  Synopsis Andrea Larson is a 74 y.o. female with COPDand nocturnal hypoxemia..She is followed by Dr. Vaughan Browner Tobacco Cessation: Former smoker , quit 2016, with a 41.25 pack year smoking history  History of Present Illness: Pt. Presents for follow up. She was scheduled for a follow up tele visit to ensure [employer] had not continued to request a walk test for the patient's nocturnal oxygen.( The request was incongruent with the diagnosis. Walk tests are only needed for ambulatory oxygen) The patient has had a O&O 01/2020 and prior to that 05/2019  which qualifies her for her nocturnal oxygen.  She states she is doing well. She is wearing her oxygen overnight. She feels this has helped with her mental alertness . She does have follow up with neurology for her dementia. She is otherwise doing well. Using her Trelegy inhaler as prescribed and using her Xopenex only as needed. She is also compliant with her Singulair. She has had a stable interval.    Observations/Objective: Imaging: CT chest 08/13/16- Lung-RADS Category 1 CT chest abdomen pelvis 03/27/2019-mild emphysema. Acute 11th rib fracture. No acute findings in abdomen pelvis.   PFTs 09/06/16 FVC 1.37 (54%), FEV1 0.70 (36%), F/F 51, TLC 110% , RV/TLC 141%, DLCO 58% Severe obstruction with positive bronchodilator response, airtrapping, moderate diffusion defect.  Labs: CBC 03/27/2019-WBC 7.3, eos 0% Alpha-1 antitrypsin 03/18/2018-190, PI  MM  Cardiac: Echo 09/11/15-EF 74-12%, grade 1 diastolic dysfunction. Mild aortic stenosis.  Stress test 08/30/16- Low risk study  Sleep: Overnight oximetry 05/13/2019  Performed on room air. Duration of study 3 and half hours Time spent less than 88% - 35 minutes, oxygen desaturation index 5.47 Nadir O2 sat of 85%     Assessment and Plan: Nocturnal hypoxemia Time spent less than 88% - 35 minutes, oxygen desaturation index 5.47 Nadir O2 sat of 85% Continues to benefit from therapy  Plan Continue wearing nocturnal oxygen at 2 L [place] Please contact office for sooner follow up if symptoms do not improve or worsen or seek emergency care  COPD  Stable interval Plan Continue Trelegy one puff once daily Rinse mouth after use Continue Xopenex as rescue as needed Note your daily symptoms > remember "red flags" for COPD:  Increase in cough, increase in sputum production, increase in shortness of breath or activity intolerance. If you notice these symptoms, please call to be seen.    Follow Up Instructions: Follow up in 3 months or as needed with Dr. Vaughan Browner    I discussed the assessment and treatment plan with the patient. The patient was provided an opportunity to ask questions and all were answered. The patient agreed with the plan and demonstrated an understanding of the instructions.   The patient was advised to call back or seek an in-person evaluation if the symptoms worsen or if the condition fails to improve as anticipated.  I provided 23 minutes of non-face-to-face time during this encounter.   Magdalen Spatz, NP 06/06/2020 11:30 AM

## 2020-06-06 NOTE — Patient Instructions (Addendum)
It is good to speak with you today. Continue wearing nocturnal oxygen at 2 L [place] Please contact office for sooner follow up if symptoms do not improve or worsen or seek emergency care Continue Trelegy one puff once daily Rinse mouth after use Continue Xopenex as rescue as needed Note your daily symptoms > remember "red flags" for COPD:  Increase in cough, increase in sputum production, increase in shortness of breath or activity intolerance. If you notice these symptoms, please call to be seen.   Follow up in 3 months or sooner if needed with Dr. Vaughan Browner or Judson Roch NP Please contact office for sooner follow up if symptoms do not improve or worsen or seek emergency care

## 2020-06-09 ENCOUNTER — Telehealth: Payer: Self-pay | Admitting: Internal Medicine

## 2020-06-09 NOTE — Telephone Encounter (Signed)
Patients son Gwyndolyn Saxon calling stating they are trying to get a home health nurse for the patient. Patient has severe memory issues and has became very agitated and she had a friend who would come by and make sure she had ate and took her medicine but she no longer is able to help so they are trying to find someone who can help. [phone]

## 2020-06-11 DIAGNOSIS — J441 Chronic obstructive pulmonary disease with (acute) exacerbation: Secondary | ICD-10-CM | POA: Diagnosis not present

## 2020-06-11 DIAGNOSIS — J438 Other emphysema: Secondary | ICD-10-CM | POA: Diagnosis not present

## 2020-06-11 DIAGNOSIS — J449 Chronic obstructive pulmonary disease, unspecified: Secondary | ICD-10-CM | POA: Diagnosis not present

## 2020-06-13 NOTE — Telephone Encounter (Signed)
Sounds they would want private care sitter, and this can be looked into by calling [facility] or any home health agency.  They will not come if there is no skilled care need, such as monitoring for worsening chf or similar

## 2020-06-13 NOTE — Telephone Encounter (Signed)
Sent to Dr. John. 

## 2020-07-18 ENCOUNTER — Other Ambulatory Visit: Payer: Self-pay | Admitting: Internal Medicine

## 2020-07-30 ENCOUNTER — Other Ambulatory Visit: Payer: Self-pay | Admitting: Pulmonary Disease

## 2020-07-30 DIAGNOSIS — J449 Chronic obstructive pulmonary disease, unspecified: Secondary | ICD-10-CM

## 2020-08-08 ENCOUNTER — Ambulatory Visit: Payer: Medicare Other | Admitting: Neurology

## 2020-08-09 ENCOUNTER — Ambulatory Visit: Payer: Medicare HMO | Admitting: Neurology

## 2020-08-09 ENCOUNTER — Encounter: Payer: Self-pay | Admitting: Neurology

## 2020-08-09 VITALS — BP 142/75 | HR 86 | Ht 62.0 in | Wt 94.0 lb

## 2020-08-09 DIAGNOSIS — F0390 Unspecified dementia without behavioral disturbance: Secondary | ICD-10-CM | POA: Diagnosis not present

## 2020-08-09 DIAGNOSIS — R269 Unspecified abnormalities of gait and mobility: Secondary | ICD-10-CM

## 2020-08-09 DIAGNOSIS — F03918 Unspecified dementia, unspecified severity, with other behavioral disturbance: Secondary | ICD-10-CM | POA: Insufficient documentation

## 2020-08-09 NOTE — Progress Notes (Signed)
Chief Complaint  Patient presents with  . New Patient (Initial Visit)    MMSE 21/30 - 8 animals. 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 She is here with her son, Andrea Larson.     HISTORICAL  Andrea Larson is a 75 year old female, seen in request by her primary care physician Dr. Cathlean Cower evaluation of memory loss, she is accompanied by her son, also power of attorney Andrea Larson at today's visit on August 09, 2020  I reviewed and summarized the referring note.PMHX COPD, previously smoker, ippd quit in 2016. 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  While her son Andrea Larson visits her, often found her in bed undressed, she spent most of the time watching TV in her bed,  She had long history of alcohol abuse, chart reviewed, in 2018, alcohol level was documented 340, recent alcohol level 2 months ago was less than 10, patient reported that she only drink 1 or 2 cups of wines each day, but her son thought she might drink more,  She also had long history of smoking, gradually quit about 5 years ago, continue have significant depression anxiety,   Laboratory evaluation November 2021, positive for benzodiazepine, ammonia 24, alcohol less than 10, 4 years ago was more than 340, CMP creatinine 0.91, Albumin 2.8, CBC showed elevated WBC BC  of 12.2, hemoglobin of 12.9, normal TSH,Elevated total cholesterol 247, LDL 139  I personally reviewed MRI of the brain without contrast November 2021: No acute abnormality, generalized atrophy, mild chronic small vessel disease, acute on chronic left maxillary sinusitis    REVIEW OF SYSTEMS: Full 14 system review of systems performed and notable only for as above All other review of systems were negative.  ALLERGIES: Allergies  Allergen Reactions  . Lipitor [Atorvastatin] Other (See Comments)    Myalgias    HOME MEDICATIONS: Current Outpatient Medications  Medication Sig Dispense Refill  . acetaminophen (TYLENOL) 325 MG tablet Take 650 mg by mouth every 6 (six) hours as needed for mild pain or headache.    . ALPRAZolam (XANAX) 0.25 MG tablet TAKE (1) TABLET TWICE A DAY AS NEEDED. 60 tablet 1  . buPROPion (WELLBUTRIN XL) 150 MG 24 hr tablet Take 1 tablet (150 mg total) by mouth daily. 90 tablet 3  . diphenoxylate-atropine (LOMOTIL) 2.5-0.025 MG tablet TAKE 1 TABLET FOUR TIMES DAILY AS NEEDED FOR DIARRHEA/LOOSE STOOLS. 40 tablet 0  . donepezil (ARICEPT) 10 MG tablet Take 1 tablet (10 mg total) by mouth at bedtime. 90 tablet 3  . feeding supplement, ENSURE ENLIVE, (ENSURE ENLIVE) LIQD Take 237 mLs by mouth 3 (three) times daily between meals. 237 mL   . FLUoxetine (PROZAC) 40 MG capsule TAKE (1) CAPSULE DAILY. 90 capsule 3  . levalbuterol (XOPENEX HFA) 45 MCG/ACT inhaler USE 2 PUFFS EVERY 6 HOURS AS NEEDED FOR WHEEZING. **SHAKE WELL** 15 g  5  . levalbuterol (XOPENEX) 0.63 MG/3ML nebulizer solution USE 1 VIAL VIA NEBULIZER EVERY 6 HOURS AS NEEDED FOR WHEEZING OR SHORTNESS OF BREATH. 72 mL 5  . montelukast (SINGULAIR) 10 MG tablet Take 1 tablet (10 mg total) by mouth daily. 90 tablet 3  . ondansetron (ZOFRAN) 8 MG tablet TAKE 1 TABLET EVERY 8 HOURS AS NEEDED FOR NAUSEA AND VOMITING. 20 tablet 0  . Respiratory Therapy Supplies (FLUTTER) DEVI Use as directed 1 each 0  . rosuvastatin (CRESTOR)  20 MG tablet Take 1 tablet (20 mg total) by mouth daily. 90 tablet 3  . Tiotropium Bromide Monohydrate (SPIRIVA RESPIMAT) 2.5 MCG/ACT AERS USE 1 PUFF DAILY AS DIRECTED Annual appt due in June must see provider for future refills 4 g 2  . TRELEGY ELLIPTA 200-62.5-25 MCG/INH AEPB INHALE 1 PUFF INTO THE LUNGS ONCE DAILY AS DIRECTED. 60 each 5  . VENTOLIN HFA 108 (90 Base) MCG/ACT inhaler USE 2 PUFFS EVERY 6 HOURS AS NEEDED FOR WHEEZING OR SHORTNESS OF BREATH. (Patient taking differently: Inhale 2 puffs into the lungs every 6 (six) hours as needed for wheezing or shortness of breath.) 18 g 3   No current facility-administered medications for this visit.    PAST MEDICAL HISTORY: Past Medical History:  Diagnosis Date  . Allergic rhinitis   . Allergic rhinitis, cause unspecified 09/21/2011  . Aortic stenosis 08/23/2016  . Arthritis   . Asthma   . Asthma 09/21/2011  . Colon polyps   . COPD (chronic obstructive pulmonary disease) (HCC)    mild   . Coronary artery calcification 08/23/2016  . Depression with anxiety   . HTN (hypertension)   . Hyperlipidemia   . UTI (lower urinary tract infection)     PAST SURGICAL HISTORY: Past Surgical History:  Procedure Laterality Date  . DENTAL SURGERY    . pilonidial cyst    . TONSILLECTOMY      FAMILY HISTORY: Family History  Problem Relation Age of Onset  . Alcohol abuse Other   . Arthritis Other   . Heart disease Other   . Stroke Other   . Hypertension Other   . Hypertension Father   . Stroke Father   . Atrial fibrillation Brother   . Other Mother        unsure of medical history  . Colon cancer Neg Hx     SOCIAL HISTORY: Social History   Socioeconomic History  . Marital status: Divorced    Spouse name: Not on file  . Number of children: 3  . Years of education: two years of college  . Highest education level: Not on file  Occupational History  . Occupation: Retired  Tobacco Use  . Smoking status: Former Smoker    Packs/day:  0.75    Years: 55.00    Pack years: 41.25    Types: Cigarettes    Quit date: 04/18/2015    Years since quitting: 5.3  . Smokeless tobacco: Never Used  Vaping Use  . Vaping Use: Never used  Substance and Sexual Activity  . Alcohol use: Yes    Comment: wine couple times each week - daily at times  . Drug use: Never  . Sexual activity: Not on file  Other Topics Concern  . Not on file  Social History Narrative   Lives with a friend.   Right-handed.   Caffeine use: one soda per day.   Social Determinants of Health   Financial Resource Strain: Not on file  Food Insecurity: Not  on file  Transportation Needs: Not on file  Physical Activity: Not on file  Stress: Not on file  Social Connections: Not on file  Intimate Partner Violence: Not on file     PHYSICAL EXAM   Vitals:   08/09/20 1052  BP: (!) 142/75  Pulse: 86  Weight: 94 lb (42.6 kg)  Height: 5\' 2"  (1.575 m)   Not recorded     Body mass index is 17.19 kg/m.  PHYSICAL EXAMNIATION:  Gen: NAD, conversant, well nourised, well groomed                     Cardiovascular: Regular rate rhythm, no peripheral edema, warm, nontender. Eyes: Conjunctivae clear without exudates or hemorrhage Neck: Supple, no carotid bruits. Pulmonary: Clear to auscultation bilaterally   NEUROLOGICAL EXAM:  MENTAL STATUS: MMSE - Mini Mental State Exam 08/09/2020  Orientation to time 0  Orientation to Place 5  Registration 3  Attention/ Calculation 5  Recall 0  Language- name 2 objects 2  Language- repeat 1  Language- follow 3 step command 3  Language- read & follow direction 1  Write a sentence 1  Copy design 0  Total score 21  animal naming 8.   CRANIAL NERVES: CN II: Visual fields are full to confrontation. Pupils are round equal and briskly reactive to light. CN III, IV, VI: extraocular movement are normal. No ptosis. CN V: Facial sensation is intact to light touch CN VII: Face is symmetric with normal eye closure  CN  VIII: Hearing is normal to causal conversation. CN IX, X: Phonation is normal. CN XI: Head turning and shoulder shrug are intact  MOTOR: There is no pronator drift of out-stretched arms. Muscle bulk and tone are normal. Muscle strength is normal.  REFLEXES: Reflexes are 2+ and symmetric at the biceps, triceps, knees, and ankles. Plantar responses are flexor.  SENSORY: Intact to light touch, pinprick and vibratory sensation are intact in fingers and toes.  COORDINATION: There is no trunk or limb dysmetria noted.  GAIT/STANCE: She needs push-up to get up from seated position, cautious, unsteady,   DIAGNOSTIC DATA (LABS, IMAGING, TESTING) - I reviewed patient records, labs, notes, testing and imaging myself where available.   ASSESSMENT AND PLAN  ONEDA DUFFETT is a 75 y.o. female   Dementia Gait abnormality  Mini-Mental Status Examination is 21 out of 30  MRI of the brain showed no significant abnormality  Laboratory evaluation showed no treatable etiology, but does show malnutrition, with decreased albumin of 2.8, weight loss of 10 pounds over past 1 month  Her memory loss is likely due to combination of mood disorder, long history of alcohol abuse, likely a central nervous system degenerative process  I have referred home health for physical therapy,  Family also designed for long-term care plans, referral for social worker,    Marcial Pacas, M.D. Ph.D.  [facility] [address] [place], [place] [address] Ph: [phone] Fax: [phone]  CC:  [address], [place],  [place] [address]

## 2020-08-15 ENCOUNTER — Telehealth: Payer: Self-pay | Admitting: Neurology

## 2020-08-15 NOTE — Telephone Encounter (Signed)
Pt's son, Marton Redwood (on [place]) called, would like to follow up and clarification from last office visit. Follow up to verify the exact diagnosis. Clarification on POA, was discussed for me to execute the POA over mother's affairs. Would like a call from the nurse.

## 2020-08-15 NOTE — Telephone Encounter (Signed)
I spoke to the patient's son on [place]. Dr. Krista Blue referred her to Home Health/social worker on 08/09/20. He has not heard from them yet. He would like for them to contact him at [phone] to schedule the appt.

## 2020-08-17 NOTE — Telephone Encounter (Signed)
I have called and mad e family aware that I am looking service there is a delay right now with staffing as soon as I get a answer I will let them know .

## 2020-08-19 ENCOUNTER — Encounter: Payer: Self-pay | Admitting: Gastroenterology

## 2020-08-22 NOTE — Telephone Encounter (Signed)
[facility] has accepted patient Andrea Larson will patient to schedule.

## 2020-08-27 ENCOUNTER — Other Ambulatory Visit: Payer: Self-pay | Admitting: Internal Medicine

## 2020-08-27 NOTE — Telephone Encounter (Signed)
Please refill as per office routine med refill policy (all routine meds refilled for 3 mo or monthly per pt preference up to one year from last visit, then month to month grace period for 3 mo, then further med refills will have to be denied)  

## 2020-09-02 ENCOUNTER — Other Ambulatory Visit: Payer: Self-pay | Admitting: Internal Medicine

## 2020-09-06 ENCOUNTER — Other Ambulatory Visit: Payer: Self-pay | Admitting: Internal Medicine

## 2020-09-06 NOTE — Telephone Encounter (Signed)
Please refill as per office routine med refill policy (all routine meds refilled for 3 mo or monthly per pt preference up to one year from last visit, then month to month grace period for 3 mo, then further med refills will have to be denied)  

## 2020-09-22 ENCOUNTER — Other Ambulatory Visit: Payer: Self-pay

## 2020-09-22 ENCOUNTER — Emergency Department (HOSPITAL_COMMUNITY): Payer: Medicare HMO

## 2020-09-22 ENCOUNTER — Emergency Department (HOSPITAL_COMMUNITY)
Admission: EM | Admit: 2020-09-22 | Discharge: 2020-09-23 | Disposition: A | Discharge status: Home / Self Care | Payer: Medicare HMO | Attending: Emergency Medicine | Admitting: Emergency Medicine

## 2020-09-22 ENCOUNTER — Encounter (HOSPITAL_COMMUNITY): Payer: Self-pay

## 2020-09-22 DIAGNOSIS — Z87891 Personal history of nicotine dependence: Secondary | ICD-10-CM | POA: Diagnosis not present

## 2020-09-22 DIAGNOSIS — M25519 Pain in unspecified shoulder: Secondary | ICD-10-CM | POA: Diagnosis not present

## 2020-09-22 DIAGNOSIS — F10929 Alcohol use, unspecified with intoxication, unspecified: Secondary | ICD-10-CM | POA: Insufficient documentation

## 2020-09-22 DIAGNOSIS — J45909 Unspecified asthma, uncomplicated: Secondary | ICD-10-CM | POA: Diagnosis not present

## 2020-09-22 DIAGNOSIS — F0280 Dementia in other diseases classified elsewhere without behavioral disturbance: Secondary | ICD-10-CM | POA: Insufficient documentation

## 2020-09-22 DIAGNOSIS — I10 Essential (primary) hypertension: Secondary | ICD-10-CM | POA: Insufficient documentation

## 2020-09-22 DIAGNOSIS — F0390 Unspecified dementia without behavioral disturbance: Secondary | ICD-10-CM | POA: Diagnosis not present

## 2020-09-22 DIAGNOSIS — Z23 Encounter for immunization: Secondary | ICD-10-CM | POA: Diagnosis not present

## 2020-09-22 DIAGNOSIS — Z79899 Other long term (current) drug therapy: Secondary | ICD-10-CM | POA: Insufficient documentation

## 2020-09-22 DIAGNOSIS — S0083XA Contusion of other part of head, initial encounter: Secondary | ICD-10-CM | POA: Diagnosis not present

## 2020-09-22 DIAGNOSIS — J449 Chronic obstructive pulmonary disease, unspecified: Secondary | ICD-10-CM | POA: Diagnosis not present

## 2020-09-22 DIAGNOSIS — S90414A Abrasion, right lesser toe(s), initial encounter: Secondary | ICD-10-CM | POA: Diagnosis not present

## 2020-09-22 DIAGNOSIS — T07XXXA Unspecified multiple injuries, initial encounter: Secondary | ICD-10-CM

## 2020-09-22 DIAGNOSIS — Y92511 Restaurant or cafe as the place of occurrence of the external cause: Secondary | ICD-10-CM | POA: Diagnosis not present

## 2020-09-22 DIAGNOSIS — I672 Cerebral atherosclerosis: Secondary | ICD-10-CM | POA: Diagnosis not present

## 2020-09-22 DIAGNOSIS — W19XXXA Unspecified fall, initial encounter: Secondary | ICD-10-CM | POA: Diagnosis not present

## 2020-09-22 DIAGNOSIS — W010XXA Fall on same level from slipping, tripping and stumbling without subsequent striking against object, initial encounter: Secondary | ICD-10-CM

## 2020-09-22 DIAGNOSIS — S60511A Abrasion of right hand, initial encounter: Secondary | ICD-10-CM | POA: Diagnosis not present

## 2020-09-22 DIAGNOSIS — J320 Chronic maxillary sinusitis: Secondary | ICD-10-CM | POA: Diagnosis not present

## 2020-09-22 DIAGNOSIS — W101XXA Fall (on)(from) sidewalk curb, initial encounter: Secondary | ICD-10-CM | POA: Insufficient documentation

## 2020-09-22 DIAGNOSIS — S20211A Contusion of right front wall of thorax, initial encounter: Secondary | ICD-10-CM

## 2020-09-22 DIAGNOSIS — S0990XA Unspecified injury of head, initial encounter: Secondary | ICD-10-CM | POA: Diagnosis not present

## 2020-09-22 DIAGNOSIS — S2243XA Multiple fractures of ribs, bilateral, initial encounter for closed fracture: Secondary | ICD-10-CM | POA: Diagnosis not present

## 2020-09-22 DIAGNOSIS — S2241XA Multiple fractures of ribs, right side, initial encounter for closed fracture: Secondary | ICD-10-CM | POA: Diagnosis not present

## 2020-09-22 DIAGNOSIS — S22050A Wedge compression fracture of T5-T6 vertebra, initial encounter for closed fracture: Secondary | ICD-10-CM | POA: Diagnosis not present

## 2020-09-22 DIAGNOSIS — G309 Alzheimer's disease, unspecified: Secondary | ICD-10-CM | POA: Diagnosis not present

## 2020-09-22 DIAGNOSIS — S22060A Wedge compression fracture of T7-T8 vertebra, initial encounter for closed fracture: Secondary | ICD-10-CM | POA: Diagnosis not present

## 2020-09-22 HISTORY — DX: Unspecified dementia, unspecified severity, without behavioral disturbance, psychotic disturbance, mood disturbance, and anxiety: F03.90

## 2020-09-22 MED ORDER — TETANUS-DIPHTH-ACELL PERTUSSIS 5-2.5-18.5 LF-MCG/0.5 IM SUSY
0.5000 mL | PREFILLED_SYRINGE | Freq: Once | INTRAMUSCULAR | Status: AC
  Administered 2020-09-22: 0.5 mL via INTRAMUSCULAR
  Filled 2020-09-22: qty 0.5

## 2020-09-22 NOTE — ED Provider Notes (Signed)
[facility] DEPT Provider Note: Georgena Spurling, MD, FACEP  CSN: [id number] MRN: [id number] ARRIVAL: 09/22/20 at 2317 ROOM: [place]  Fall  Level 5 caveat: Dementia HISTORY OF PRESENT ILLNESS  09/22/20 11:28 PM Andrea Larson is a 75 y.o. female with Alzheimer's dementia who reportedly tripped on the curb and fell at [employer] just prior to arrival.  She has a hematoma to her right Larson.  She did not lose consciousness and she has not been vomiting.  She reportedly consumed alcohol at the restaurant.  She does not remember what happened or how she got to the ED.  She denies any pain.  She states she does not want to be here.   Past Medical History:  Diagnosis Date  . Allergic rhinitis   . Allergic rhinitis, cause unspecified 09/21/2011  . Aortic stenosis 08/23/2016  . Arthritis   . Asthma   . Asthma 09/21/2011  . Colon polyps   . COPD (chronic obstructive pulmonary disease) (HCC)    mild   . Coronary artery calcification 08/23/2016  . Dementia ([place])   . Depression with anxiety   . HTN (hypertension)   . Hyperlipidemia   . UTI (lower urinary tract infection)     Past Surgical History:  Procedure Laterality Date  . DENTAL SURGERY    . pilonidial cyst    . TONSILLECTOMY      Family History  Problem Relation Age of Onset  . Alcohol abuse Other   . Arthritis Other   . Heart disease Other   . Stroke Other   . Hypertension Other   . Hypertension Father   . Stroke Father   . Atrial fibrillation Brother   . Other Mother        unsure of medical history  . Colon cancer Neg Hx     Social History   Tobacco Use  . Smoking status: Former Smoker    Packs/day: 0.75    Years: 55.00    Pack years: 41.25    Types: Cigarettes    Quit date: 04/18/2015    Years since quitting: 5.4  . Smokeless tobacco: Never Used  Vaping Use  . Vaping Use: Never used  Substance Use Topics  . Alcohol use: Yes    Comment: wine couple times each week -  daily at times  . Drug use: Never    Prior to Admission medications   Medication Sig Start Date End Date Taking? Authorizing Provider  acetaminophen (TYLENOL) 325 MG tablet Take 650 mg by mouth every 6 (six) hours as needed for mild pain or headache.    [provider]  ALPRAZolam Duanne Moron) 0.25 MG tablet TAKE (1) TABLET TWICE A DAY AS NEEDED. 07/18/20   Biagio Borg, MD  buPROPion (WELLBUTRIN XL) 150 MG 24 hr tablet Take 1 tablet (150 mg total) by mouth daily. 04/05/20   Biagio Borg, MD  diphenoxylate-atropine (LOMOTIL) 2.5-0.025 MG tablet TAKE 1 TABLET FOUR TIMES DAILY AS NEEDED FOR DIARRHEA/LOOSE STOOLS. 05/12/20   Biagio Borg, MD  donepezil (ARICEPT) 10 MG tablet Take 1 tablet (10 mg total) by mouth at bedtime. 04/05/20   Biagio Borg, MD  feeding supplement, ENSURE ENLIVE, (ENSURE ENLIVE) LIQD Take 237 mLs by mouth 3 (three) times daily between meals. 04/21/18   Biagio Borg, MD  FLUoxetine (PROZAC) 40 MG capsule TAKE (1) CAPSULE DAILY. 04/05/20   Biagio Borg, MD  levalbuterol [facility] HFA) 45 MCG/ACT inhaler USE 2 PUFFS  EVERY 6 HOURS AS NEEDED FOR WHEEZING. **SHAKE WELL** 07/07/19   Mannam, Praveen, MD  levalbuterol (XOPENEX) 0.63 MG/3ML nebulizer solution USE 1 VIAL VIA NEBULIZER EVERY 6 HOURS AS NEEDED FOR WHEEZING OR SHORTNESS OF BREATH. 04/15/19   Mannam, Hart Robinsons, MD  montelukast (SINGULAIR) 10 MG tablet Take 1 tablet (10 mg total) by mouth daily. 04/05/20   Biagio Borg, MD  ondansetron (ZOFRAN) 8 MG tablet TAKE 1 TABLET EVERY 8 HOURS AS NEEDED FOR NAUSEA AND VOMITING. 05/16/20   Biagio Borg, MD  Respiratory Therapy Supplies (FLUTTER) DEVI Use as directed 08/19/17   Marshell Garfinkel, MD  rosuvastatin (CRESTOR) 20 MG tablet Take 1 tablet (20 mg total) by mouth daily. 04/05/20   Biagio Borg, MD  Tiotropium Bromide Monohydrate (SPIRIVA RESPIMAT) 2.5 MCG/ACT AERS USE 1 PUFF DAILY AS DIRECTED Annual appt due in June must see provider for future refills 10/27/19   Biagio Borg, MD   TRELEGY ELLIPTA 200-62.5-25 MCG/INH AEPB INHALE 1 PUFF INTO THE LUNGS ONCE DAILY AS DIRECTED. 08/02/20   Mannam, Praveen, MD  VENTOLIN HFA 108 (90 Base) MCG/ACT inhaler USE 2 PUFFS EVERY 6 HOURS AS NEEDED FOR WHEEZING OR SHORTNESS OF BREATH. Patient taking differently: Inhale 2 puffs into the lungs every 6 (six) hours as needed for wheezing or shortness of breath. 05/16/20   Marshell Garfinkel, MD    Allergies Lipitor [atorvastatin]   REVIEW OF SYSTEMS  Negative except as noted here or in the History of Present Illness.   PHYSICAL EXAMINATION  Initial Vital Signs Blood pressure 134/75, pulse 94, temperature 98 F (36.7 C), temperature source Oral, resp. rate 20, height 5\' 2"  (1.575 m), weight 42.6 kg, SpO2 93 %.  Examination General: Well-developed, well-nourished female in no acute distress; appearance consistent with age of record HENT: normocephalic; hematoma right Larson Eyes: pupils equal, round and reactive to light; extraocular muscles intact; bilateral pseudophakia Neck: supple; nontender; no pain on passive range of motion Heart: regular rate and rhythm Lungs: clear to auscultation bilaterally Chest: Right lateral rib tenderness Abdomen: soft; nondistended; nontender; bowel sounds present Extremities: Arthritic changes; no acute deformity; no pain on passive range of motion Neurologic: Awake, alert and oriented x2; motor function intact in all extremities and symmetric; no facial droop Skin: Warm and dry; superficial abrasions to right fifth toe, dorsal right hand and left thenar web Psychiatric: Patient angry that she is here but apologetic for her anger   RESULTS  Summary of this visit's results, reviewed and interpreted by myself:   EKG Interpretation  Date/Time:    Ventricular Rate:    PR Interval:    QRS Duration:   QT Interval:    QTC Calculation:   R Axis:     Text Interpretation:        Laboratory Studies: Results for orders placed or performed during  the hospital encounter of 09/22/20 (from the past 24 hour(s))  Ethanol     Status: Abnormal   Collection Time: 09/22/20 11:42 PM  Result Value Ref Range   Alcohol, Ethyl (B) 267 (H) <10 mg/dL   Imaging Studies: CT Head Wo Contrast  Result Date: 09/23/2020 CLINICAL DATA:  Intracranial venous injury. Dementia. Andrea Larson. EXAM: CT HEAD WITHOUT CONTRAST TECHNIQUE: Contiguous axial images were obtained from the base of the skull through the vertex without intravenous contrast. COMPARISON:  CT head 03/27/2019 FINDINGS: Brain: Patchy and confluent areas of decreased attenuation are noted throughout the deep and periventricular white matter of the cerebral hemispheres bilaterally,  compatible with chronic microvascular ischemic disease. No evidence of large-territorial acute infarction. No parenchymal hemorrhage. No mass lesion. No extra-axial collection. No mass effect or midline shift. No hydrocephalus. Basilar cisterns are patent. Vascular: No hyperdense vessel. Atherosclerotic calcifications are present within the cavernous internal carotid and vertebral arteries. Skull: No acute fracture or focal lesion. Sinuses/Orbits: Almost complete opacification of the visualized left maxillary sinus with associated hyperostosis of the maxillary sinus walls. Paranasal sinuses and mastoid air cells are clear. The orbits are unremarkable. Other: None. IMPRESSION: 1. No acute intracranial abnormality. 2. Chronic left maxillary sinusitis. Electronically Signed   By: Iven Finn M.D.   On: 09/23/2020 00:15   CT Chest Wo Contrast  Result Date: 09/23/2020 CLINICAL DATA:  [place] [employer], ETOH, evaluate for rib fracture, prev ct chest 02/10/20 EXAM: CT CHEST WITHOUT CONTRAST TECHNIQUE: Multidetector CT imaging of the chest was performed following the standard protocol without IV contrast. COMPARISON:  CT chest 02/10/2020 FINDINGS: Limited evaluation due to respiratory motion artifact. Cardiovascular:  Normal heart size. No significant pericardial effusion. The thoracic aorta is normal in caliber. Moderate atherosclerotic plaque of the thoracic aorta. At least mild to moderate four-vessel coronary artery calcifications. Mediastinum/Nodes: No enlarged mediastinal or axillary lymph nodes. Thyroid gland, trachea, and esophagus demonstrate no significant findings. Lungs/Pleura: Few scattered pulmonary micronodules. Mild biapical paraseptal emphysematous changes. No pulmonary mass. No focal consolidation. No pulmonary contusion. No pulmonary laceration. No pneumatocele formation. No pleural effusion. No pneumothorax. Upper Abdomen: No acute abnormality on this noncontrast study. Musculoskeletal: No chest wall abnormality. Diffusely decreased bone density. Chronic T7 and T9 compression fractures with greater than 95% height loss at the T7 level and greater than 70% height loss of the T9 level. Anterior wedge shaped compression deformity of the T5 vertebral body. No suspicious lytic or blastic osseous lesions. No acute displaced fracture. Old anterior and posterior nondisplaced bilateral rib fractures.Multilevel degenerative changes of the spine. IMPRESSION: 1. No definite acute displaced rib fracture in a patient with several bilateral old rib fractures. Please note markedly limited evaluation due to respiratory motion artifact. 2. Stable chronic T5, T7, T9 compression fractures. 3. No acute intrathoracic abnormality. 4. Aortic Atherosclerosis (ICD10-I70.0) and Emphysema (ICD10-J43.9). Electronically Signed   By: Iven Finn M.D.   On: 09/23/2020 03:37    ED COURSE and MDM  Nursing notes, initial and subsequent vitals signs, including pulse oximetry, reviewed and interpreted by myself.  Vitals:   09/23/20 0200 09/23/20 0230 09/23/20 0306 09/23/20 0330  BP: 127/65 136/83 (!) 127/98 126/72  Pulse: 100 100 (!) 107 98  Resp: 20 20 (!) 25 (!) 21  Temp:      TempSrc:      SpO2: 97% 98% 94% 98%  Weight:       Height:       Medications  Tdap (BOOSTRIX) injection 0.5 mL (0.5 mLs Intramuscular Given 09/22/20 2357)  HYDROcodone-acetaminophen (NORCO/VICODIN) 5-325 MG per tablet 1 tablet (1 tablet Oral Given 09/23/20 0242)   4:05 AM Patient and sister advised of CT showing no definite rib fracture.  They were advised to the patient's blood alcohol and the sister, who lives with the patient, was advised that someone with diagnosed dementia should not be drinking alcohol at all and that she should try and prevent the patient from having access to alcohol.  The patient still has no recollection of her fall earlier or that she is even in the hospital but her sister acknowledges this is typical for her.  PROCEDURES  Procedures   ED DIAGNOSES     ICD-10-CM   1. Alcoholic intoxication with complication ([place])  P54.301   2. Fall from slip, trip, or stumble, initial encounter  W01.0XXA   3. Traumatic hematoma of Larson, initial encounter  S00.83XA   4. Abrasion, multiple sites  T07.XXXA   5. Chest wall contusion, right, initial encounter  U84.039J        Shanon Rosser, MD 09/23/20 [phone]

## 2020-09-22 NOTE — ED Triage Notes (Signed)
Patient BIB [facility] after falling on the curb at [facility]. Patient has dementia at baseline, no blood thinners, no lose of consciousness. Patient has a bruise to right forehead. Alcohol is on board.   EMS vitals HR 76 O2 97% room air CBG 95 134/82

## 2020-09-23 ENCOUNTER — Emergency Department (HOSPITAL_COMMUNITY): Payer: Medicare HMO

## 2020-09-23 ENCOUNTER — Telehealth: Payer: Self-pay | Admitting: *Deleted

## 2020-09-23 DIAGNOSIS — J320 Chronic maxillary sinusitis: Secondary | ICD-10-CM | POA: Diagnosis not present

## 2020-09-23 DIAGNOSIS — S2241XA Multiple fractures of ribs, right side, initial encounter for closed fracture: Secondary | ICD-10-CM | POA: Diagnosis not present

## 2020-09-23 DIAGNOSIS — S2243XA Multiple fractures of ribs, bilateral, initial encounter for closed fracture: Secondary | ICD-10-CM | POA: Diagnosis not present

## 2020-09-23 DIAGNOSIS — I672 Cerebral atherosclerosis: Secondary | ICD-10-CM | POA: Diagnosis not present

## 2020-09-23 DIAGNOSIS — S0990XA Unspecified injury of head, initial encounter: Secondary | ICD-10-CM | POA: Diagnosis not present

## 2020-09-23 DIAGNOSIS — S22050A Wedge compression fracture of T5-T6 vertebra, initial encounter for closed fracture: Secondary | ICD-10-CM | POA: Diagnosis not present

## 2020-09-23 DIAGNOSIS — F0390 Unspecified dementia without behavioral disturbance: Secondary | ICD-10-CM | POA: Diagnosis not present

## 2020-09-23 DIAGNOSIS — S22060A Wedge compression fracture of T7-T8 vertebra, initial encounter for closed fracture: Secondary | ICD-10-CM | POA: Diagnosis not present

## 2020-09-23 LAB — ETHANOL: Alcohol, Ethyl (B): 267 mg/dL — ABNORMAL HIGH (ref <10)

## 2020-09-23 MED ORDER — HYDROCODONE-ACETAMINOPHEN 5-325 MG PO TABS
1.0000 | ORAL_TABLET | Freq: Once | ORAL | Status: AC
  Administered 2020-09-23: 1 via ORAL
  Filled 2020-09-23: qty 1

## 2020-09-23 NOTE — ED Notes (Signed)
Patient requesting pain medication. Dr. Molpus notified.  

## 2020-09-23 NOTE — Telephone Encounter (Signed)
TOC CM received call from pt's son, stating she is a great deal of pain and area is black. He wanted to see if ED provider will send pain med to her pharmacy. Explained the provider who saw her in ED is currently not in ED. And another provider cannot write for narcotics without seeing pt. States he will discuss with pt if she wants to come back to ED to be seen.   Jonnie Finner RN CCM Case Mgmt phone [phone]

## 2020-09-26 ENCOUNTER — Ambulatory Visit: Payer: Medicare HMO | Admitting: Family

## 2020-09-26 DIAGNOSIS — Z0289 Encounter for other administrative examinations: Secondary | ICD-10-CM

## 2020-10-04 ENCOUNTER — Ambulatory Visit: Payer: Medicare HMO | Admitting: Internal Medicine

## 2020-10-04 IMAGING — CT CT ABD-PELV W/ CM
2 of 5 series · 14 of 46 positions shown, 16 images · IV contrast (OMNIPAQUE 300)
Comparison: Low-dose lung cancer screening CT chest dated
08/13/2016

CLINICAL DATA: Fall, left flank pain

EXAM:
CT CHEST, ABDOMEN, AND PELVIS WITH CONTRAST
TECHNIQUE: Multidetector CT imaging of the chest, abdomen and pelvis was
performed following the standard protocol during bolus
administration of intravenous contrast.
CONTRAST:  75mL OMNIPAQUE IOHEXOL 300 MG/ML  SOLN

[Series 3: cap with · axial · 0.62mm/px · z∈[+939,+1434]mm · 11 of 119 slices shown, 13 images]
[im 10/119  soft-tissue]
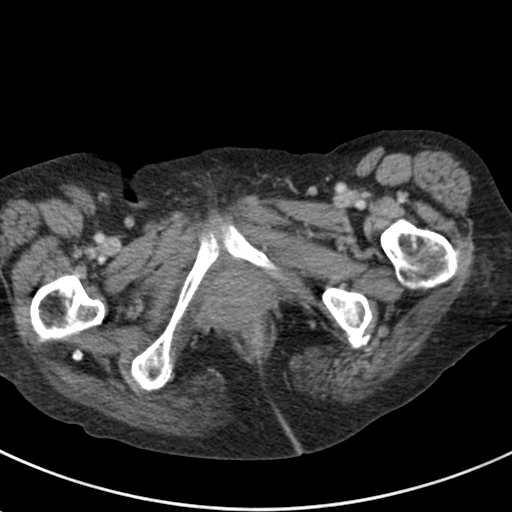
[im 10/119  bone]
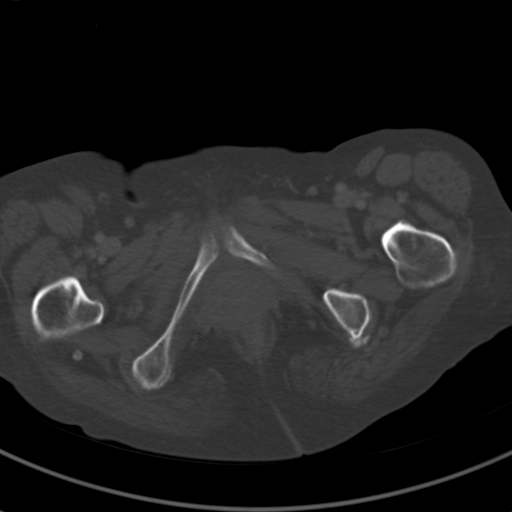
[im 20/119  soft-tissue]
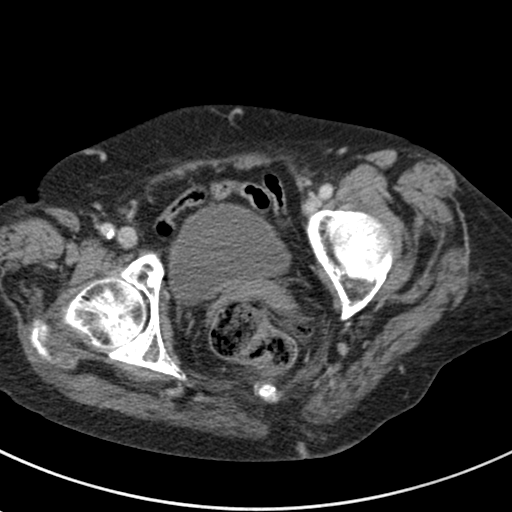
[im 30/119  soft-tissue]
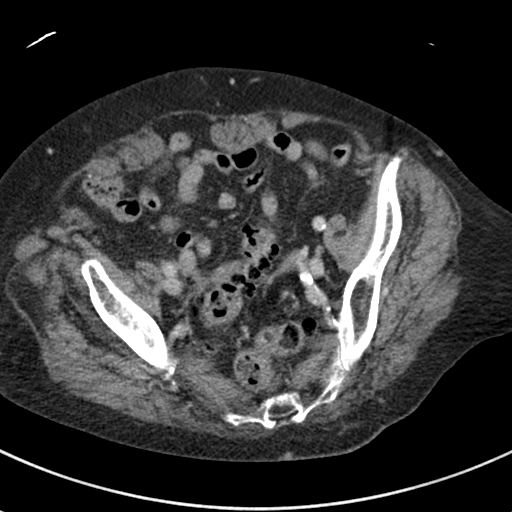
[im 40/119  soft-tissue]
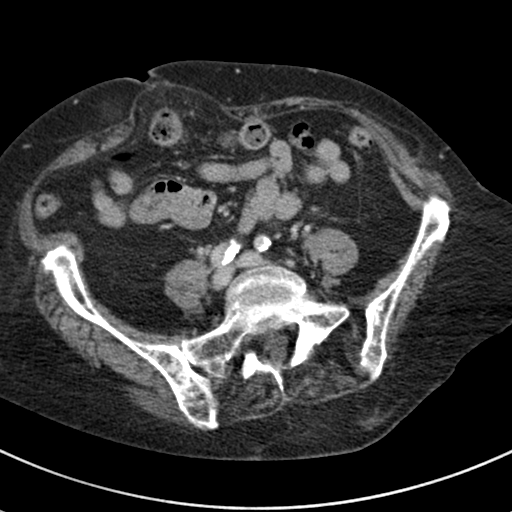
[im 50/119  soft-tissue]
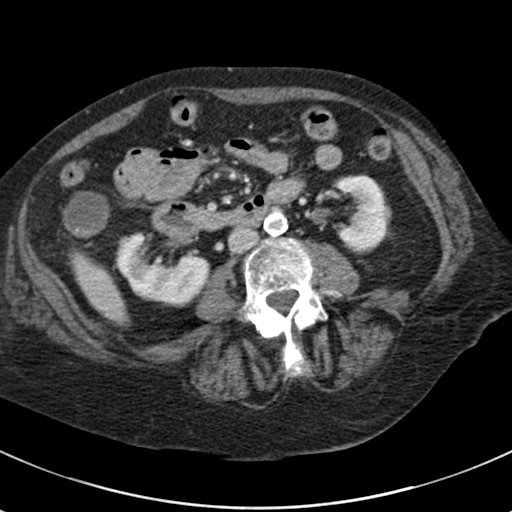
[im 60/119  soft-tissue]
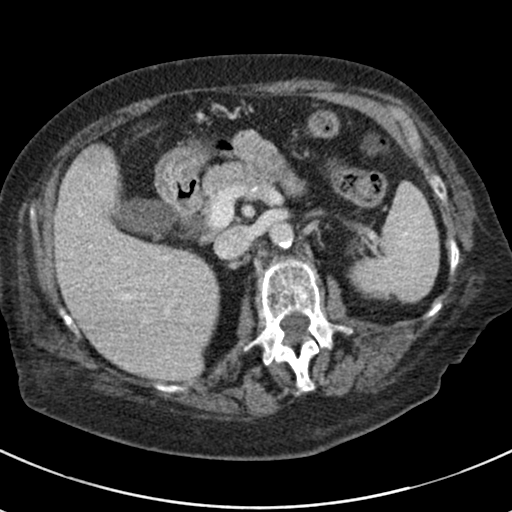
[im 69/119  soft-tissue]
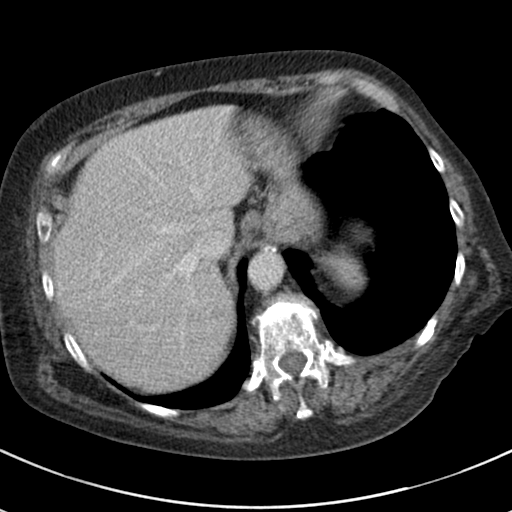
[im 79/119  soft-tissue]
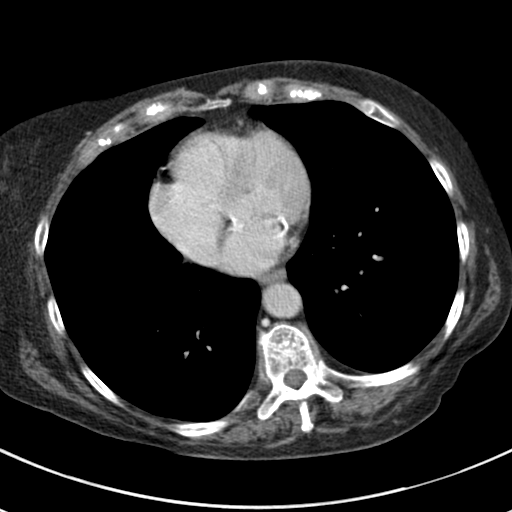
[im 89/119  soft-tissue]
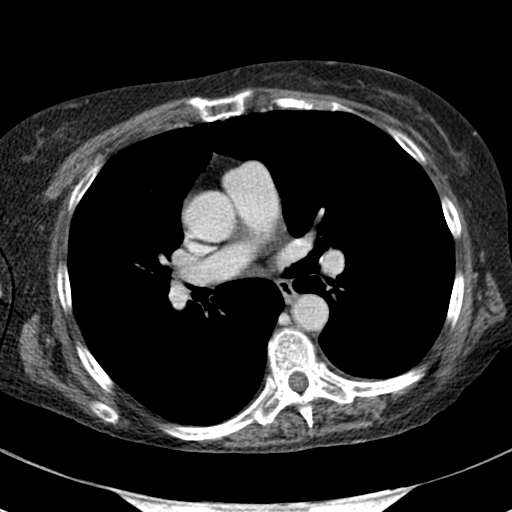
[im 89/119  bone]
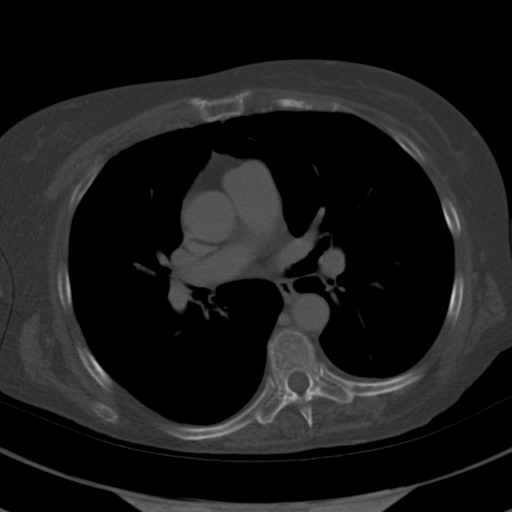
[im 99/119  soft-tissue]
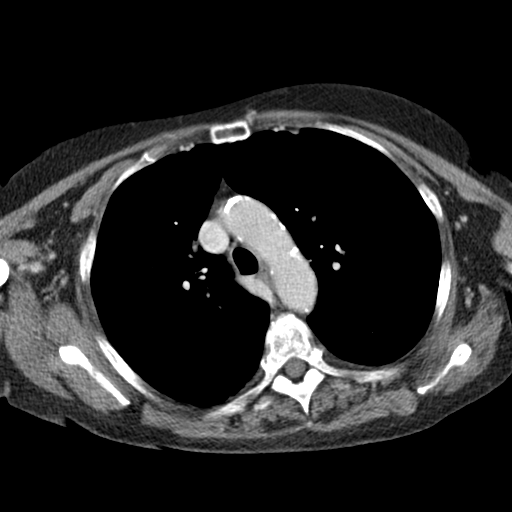
[im 109/119  soft-tissue]
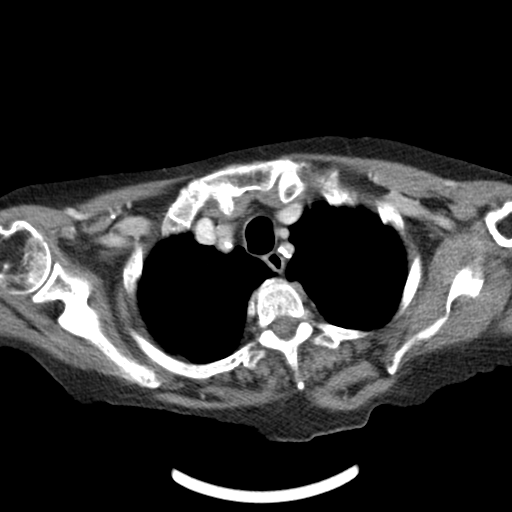

[Series 6: coronals · coronal · 0.69mm/px · 3 of 120 slices shown]
[im 40/120  soft-tissue]
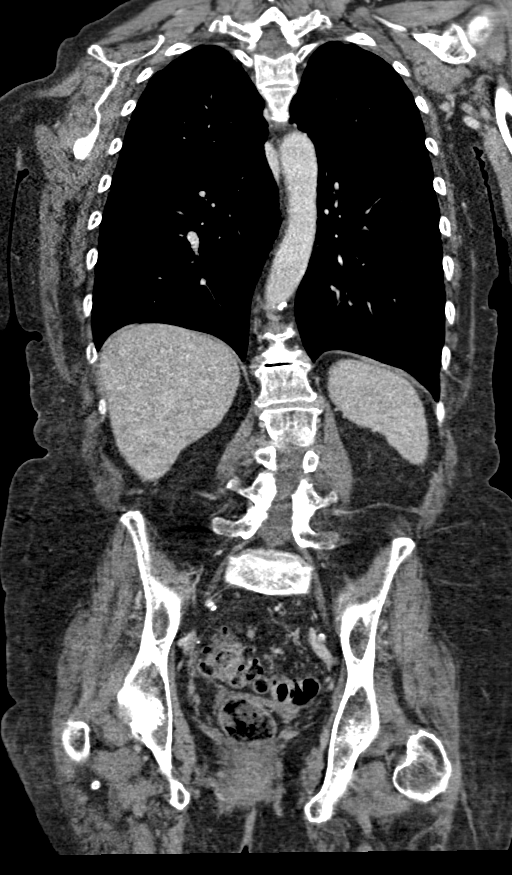
[im 53/120  soft-tissue]
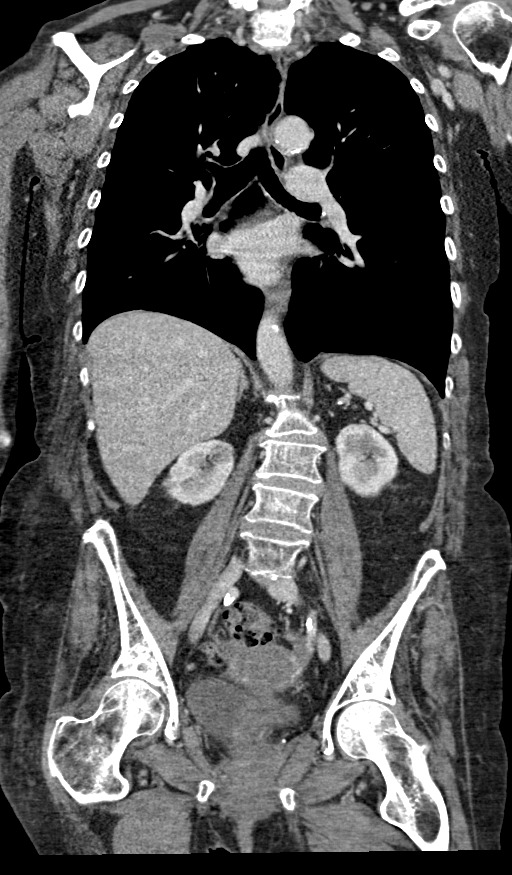
[im 67/120  soft-tissue]
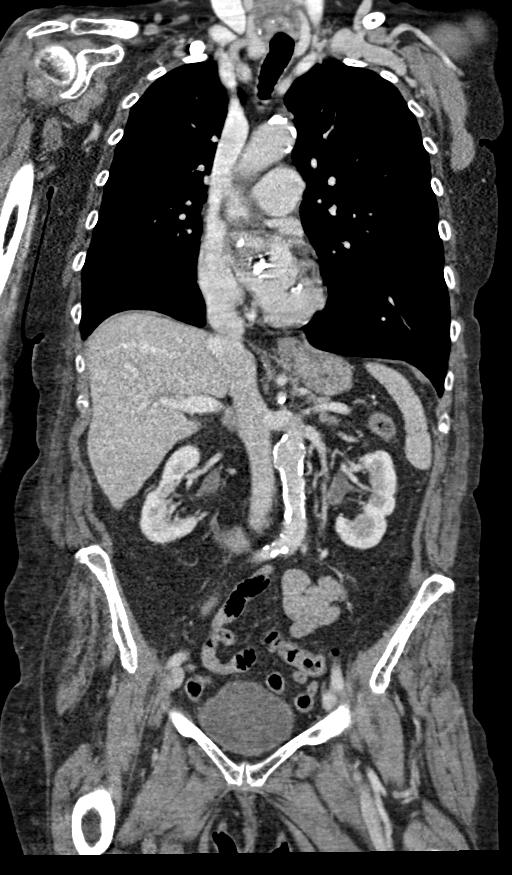

[14 of 46 positions shown; findings below may reference images not displayed]

FINDINGS: CT CHEST FINDINGS

Cardiovascular: No evidence of traumatic aortic injury. No evidence
thoracic aortic aneurysm or dissection. Atherosclerotic
calcifications aortic root/arch.

The heart is normal in size.  No pericardial effusion.

Coronary atherosclerosis the LAD and right coronary artery.

Mediastinum/Nodes: No suspicious mediastinal lymphadenopathy.

Visualized thyroid is unremarkable.

Lungs/Pleura: Mild paraseptal emphysematous changes at the lung
apices.

No focal consolidation.

No pleural effusion or pneumothorax.

Musculoskeletal: Mild superior endplate compression fracture
deformity at T5 and T7, chronic. Mild degenerative changes of the
thoracic spine.

Nondisplaced left posterior 11th rib fracture deformity (series
3/image 52), likely accounting for the patient's acute left flank
pain.

Bilateral clavicles, sternum, and scapulae are intact.

CT ABDOMEN PELVIS FINDINGS

Hepatobiliary: Liver is within normal limits. No perihepatic
fluid/hemorrhage.

Gallbladder is notable for tiny gallstones in the gallbladder fundus
(series 3/image 31), without associated intrahepatic or extrahepatic
ductal dilatation.

Pancreas: Within normal limits.

Spleen: Within normal limits.  No perisplenic fluid/hemorrhage.

Adrenals/Urinary Tract: Adrenal glands are within normal limits.

Right renal cortical scarring. Left kidney is unremarkable. No
hydronephrosis.

Bladder is within normal limits.

Stomach/Bowel: Stomach is within normal limits.

No evidence of bowel obstruction.

Normal appendix (series 3/image 93).

Sigmoid diverticulosis, without evidence of diverticulitis.

Vascular/Lymphatic: No evidence of abdominal aortic aneurysm.

Atherosclerotic calcifications of the abdominal aorta and branch
vessels.

No suspicious abdominopelvic lymphadenopathy.

Reproductive: Uterus is within normal limits.

Bilateral ovaries are within normal limits.

Other: No abdominopelvic ascites.

Two small fat containing periumbilical ventral hernias (series
3/image 82).

Musculoskeletal: Mild degenerative changes of the lumbar spine.
IMPRESSION: Nondisplaced left posterior 11th rib fracture deformity, likely
accounting for the patient's left flank pain.

No evidence of traumatic injury to the abdomen/pelvis.

Additional ancillary findings as above.

## 2020-10-04 IMAGING — CT CT CERVICAL SPINE W/O CM
4 of 5 series · 13 of 33 positions shown, 16 images · non-contrast
Comparison: Head CT 12/18/2015

CLINICAL DATA: Slipped and fell in the rain yesterday with injury
to right side of head.

EXAM:
CT HEAD WITHOUT CONTRAST
CT CERVICAL SPINE WITHOUT CONTRAST
TECHNIQUE: Multidetector CT imaging of the head and cervical spine was
performed following the standard protocol without intravenous
contrast. Multiplanar CT image reconstructions of the cervical spine
were also generated.

[Series 4: c spine soft · axial · 0.28mm/px · z∈[+1446,+1472]mm · 2 of 81 slices shown]
[im 14/81  soft-tissue]
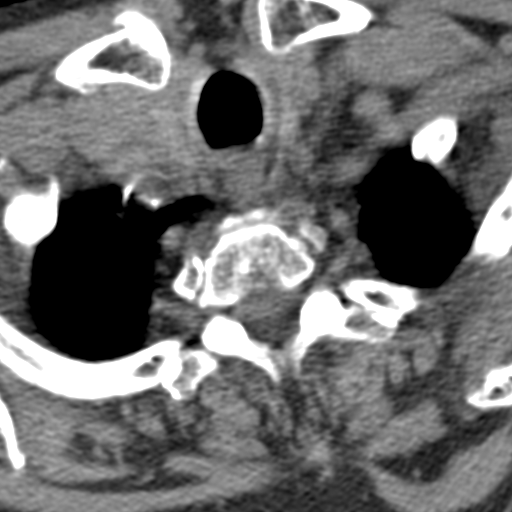
[im 27/81  soft-tissue]
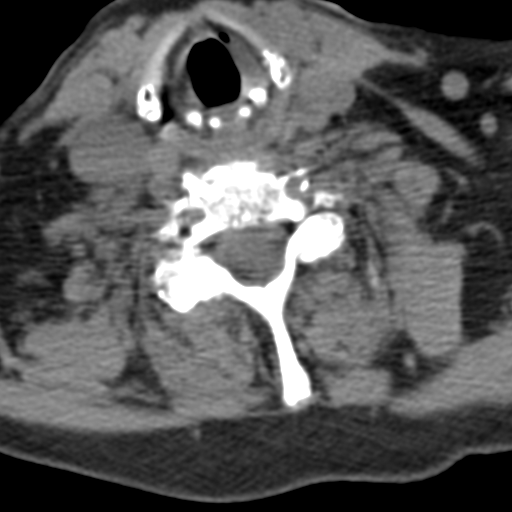

[Series 5: orthogonal bone · axial · 0.23mm/px · z∈[+1419,+1524]mm · 5 of 90 slices shown, 7 images]
[im 15/90  soft-tissue]
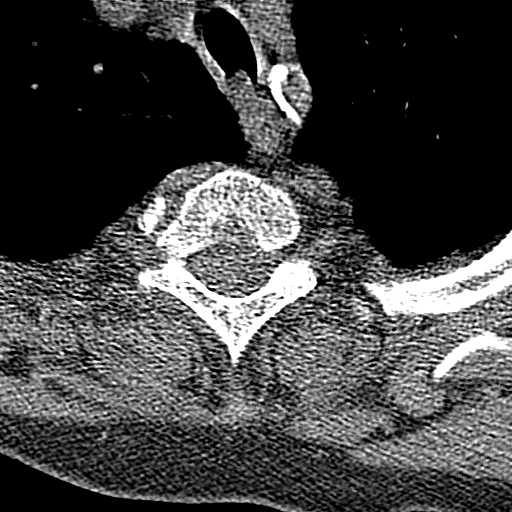
[im 15/90  bone]
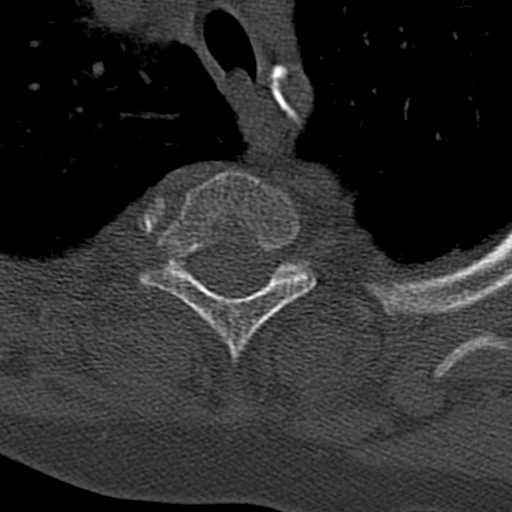
[im 30/90  bone]
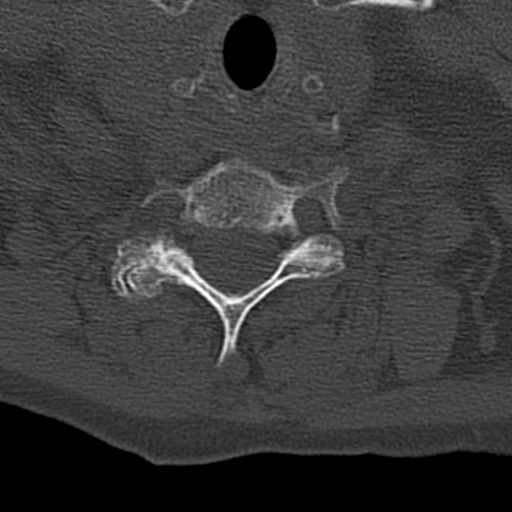
[im 45/90  bone]
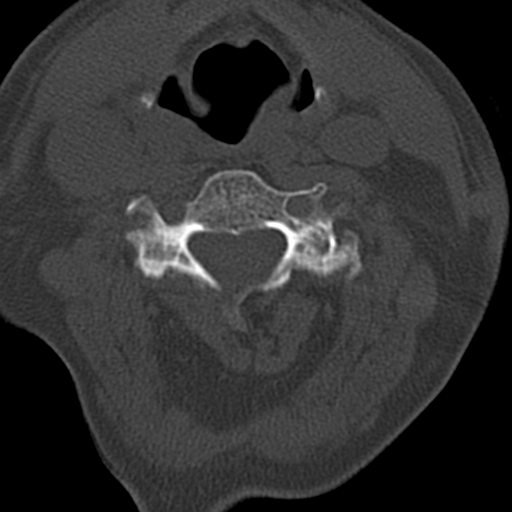
[im 60/90  bone]
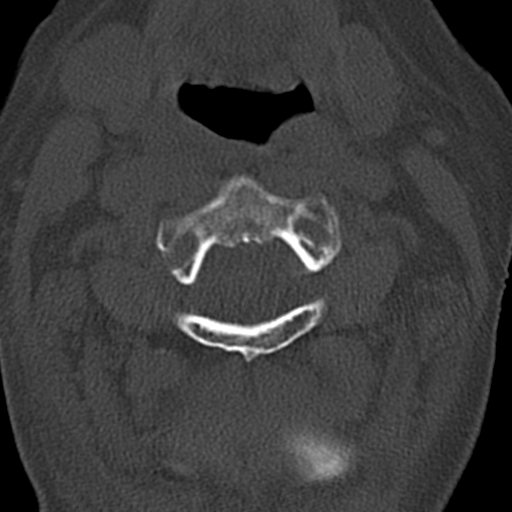
[im 75/90  soft-tissue]
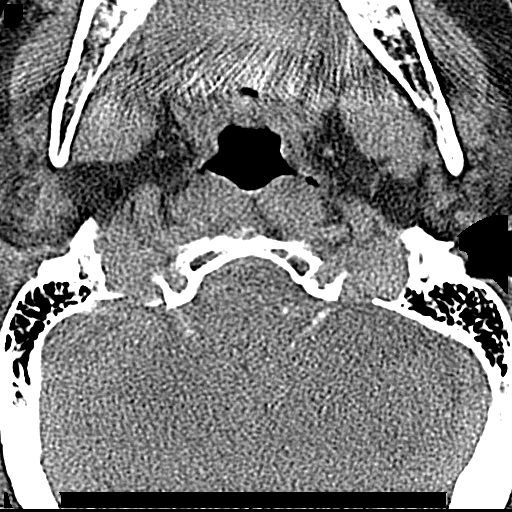
[im 75/90  bone]
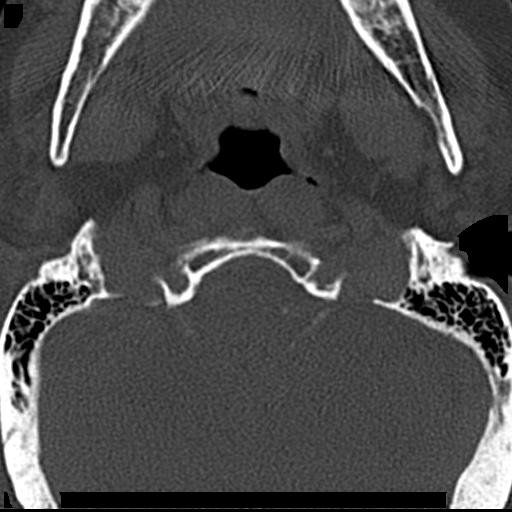

[Series 6: coronal bone · coronal · 0.23mm/px · 1 of 61 slices shown]
[im 31/61  bone]
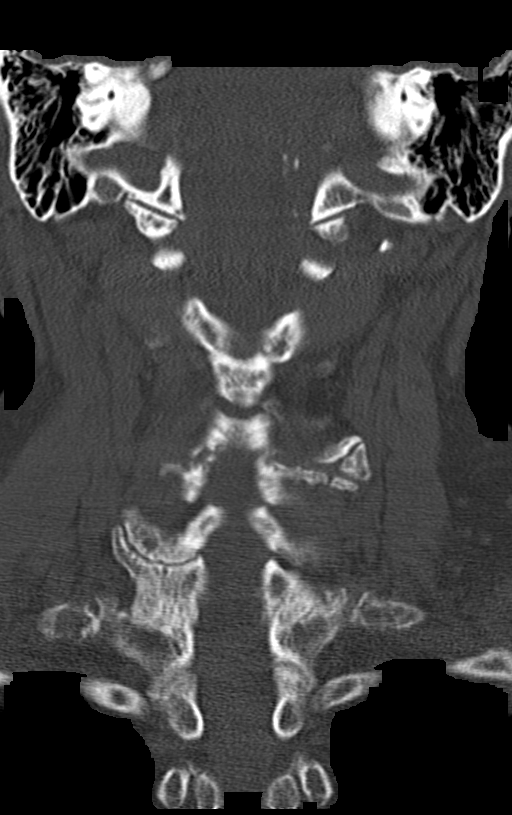

[Series 9: sagittal bone · sagittal · 0.26mm/px · 5 of 61 slices shown, 6 images]
[im 21/61  bone]
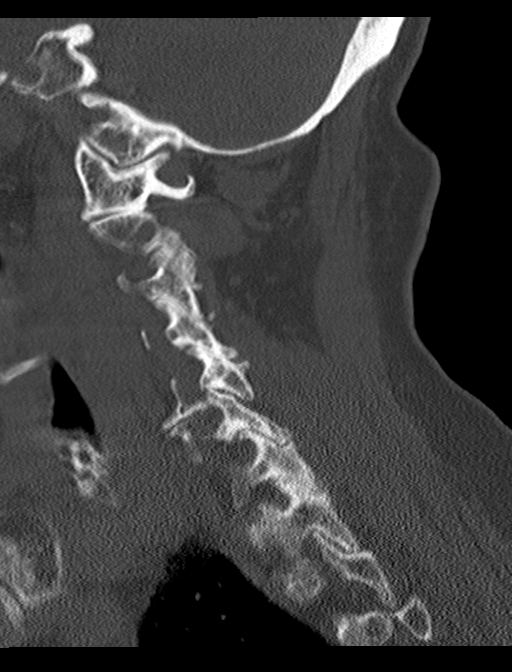
[im 26/61  bone]
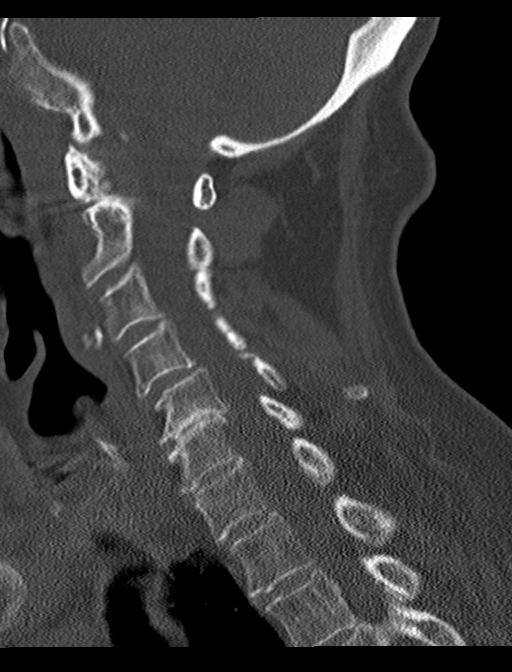
[im 31/61  soft-tissue]
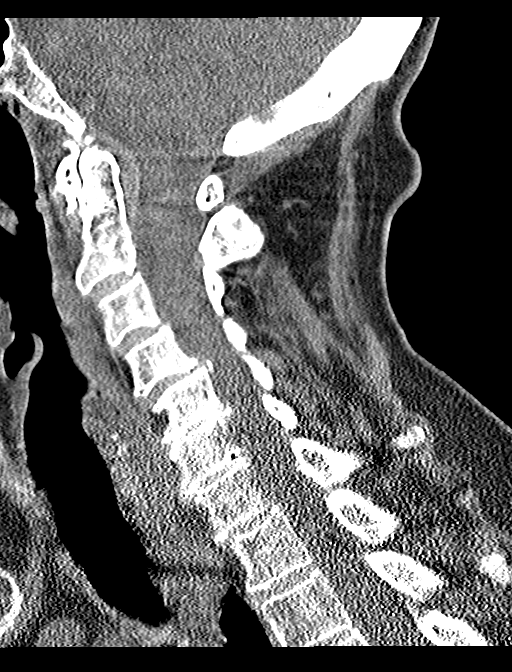
[im 31/61  bone]
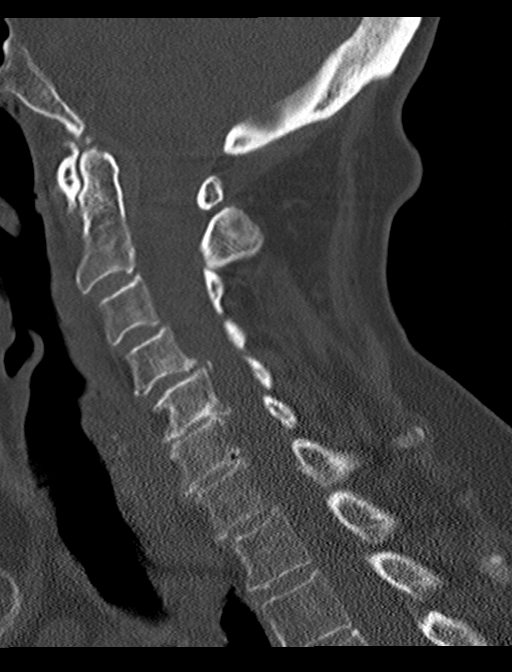
[im 36/61  bone]
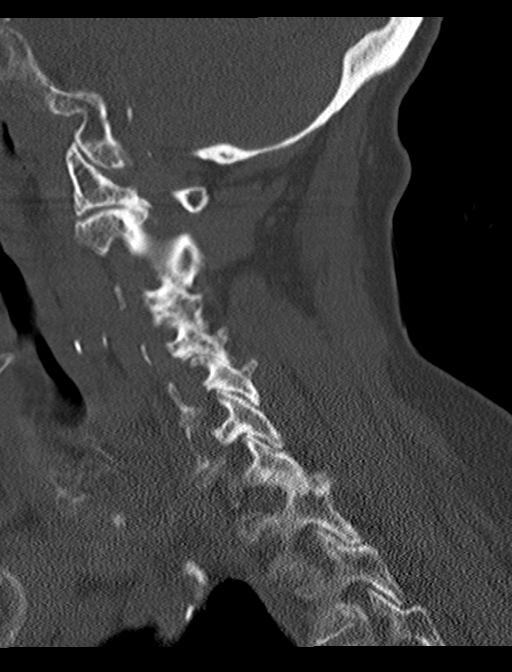
[im 41/61  bone]
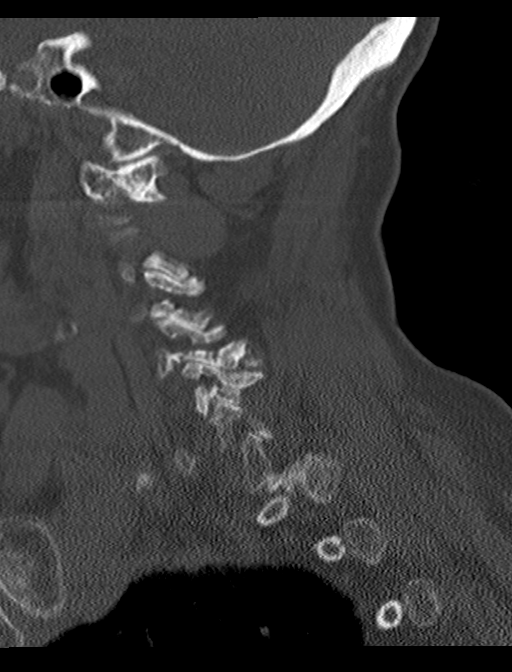

[13 of 33 positions shown; findings below may reference images not displayed]

FINDINGS: CT HEAD FINDINGS

Brain: The ventricles, cisterns and other CSF spaces are within
normal. There is minimal age related atrophic change and chronic
ischemic microvascular disease. There is no mass, mass effect, shift
of midline structures or acute hemorrhage.

Vascular: No hyperdense vessel or unexpected calcification.

Skull: Normal. Negative for fracture or focal lesion.

Sinuses/Orbits: Orbits are normal and symmetric. There is moderate
opacification over the left maxillary sinus with small air-fluid
level unchanged. Mastoid air cells are clear.

Other: Soft tissue swelling over the right parietal scalp.

CT CERVICAL SPINE FINDINGS

Alignment: Subtle stable retrolisthesis of C5 with respect to C4 and
C6 likely due to the moderate facet arthropathy. Subtle anterior
stairstep subluxation of C3 on C4 and C4 on C5 unchanged. No
posttraumatic subluxation.

Skull base and vertebrae: Vertebral body heights are maintained.
There is mild spondylosis throughout the cervical spine to include
uncovertebral joint spurring and facet arthropathy. Moderate
bilateral neural foraminal narrowing at multiple levels worse at the
C5-6 level. No acute fracture.

Soft tissues and spinal canal: No prevertebral fluid or swelling. No
visible canal hematoma.

Disc levels:  Disc space narrowing at the C5-6 level.

Upper chest: No acute findings.

Other: None.
IMPRESSION: 1. No acute brain injury. Soft tissue swelling over the right
parietal scalp.

2. Mild chronic ischemic microvascular disease. Stable chronic
inflammatory change left maxillary sinus.

3.  No acute cervical spine injury.

4. Mild spondylosis throughout the cervical spine with disc disease
at the C5-6 level. Multilevel bilateral neural foraminal narrowing
worse at the C5-6 level. Mild stable degenerative subluxations as
described.

## 2020-10-12 ENCOUNTER — Ambulatory Visit (INDEPENDENT_AMBULATORY_CARE_PROVIDER_SITE_OTHER): Payer: Medicare HMO | Admitting: Internal Medicine

## 2020-10-12 ENCOUNTER — Other Ambulatory Visit: Payer: Self-pay

## 2020-10-12 ENCOUNTER — Encounter: Payer: Self-pay | Admitting: Internal Medicine

## 2020-10-12 VITALS — BP 120/84 | HR 94 | Ht 62.0 in | Wt 93.0 lb

## 2020-10-12 DIAGNOSIS — R739 Hyperglycemia, unspecified: Secondary | ICD-10-CM

## 2020-10-12 DIAGNOSIS — E538 Deficiency of other specified B group vitamins: Secondary | ICD-10-CM

## 2020-10-12 DIAGNOSIS — Z0000 Encounter for general adult medical examination without abnormal findings: Secondary | ICD-10-CM | POA: Diagnosis not present

## 2020-10-12 DIAGNOSIS — J449 Chronic obstructive pulmonary disease, unspecified: Secondary | ICD-10-CM | POA: Diagnosis not present

## 2020-10-12 DIAGNOSIS — Z0001 Encounter for general adult medical examination with abnormal findings: Secondary | ICD-10-CM

## 2020-10-12 DIAGNOSIS — Z860100 Personal history of colon polyps, unspecified: Secondary | ICD-10-CM

## 2020-10-12 DIAGNOSIS — E559 Vitamin D deficiency, unspecified: Secondary | ICD-10-CM

## 2020-10-12 DIAGNOSIS — R269 Unspecified abnormalities of gait and mobility: Secondary | ICD-10-CM

## 2020-10-12 DIAGNOSIS — E785 Hyperlipidemia, unspecified: Secondary | ICD-10-CM

## 2020-10-12 DIAGNOSIS — I10 Essential (primary) hypertension: Secondary | ICD-10-CM

## 2020-10-12 DIAGNOSIS — F1027 Alcohol dependence with alcohol-induced persisting dementia: Secondary | ICD-10-CM | POA: Diagnosis not present

## 2020-10-12 DIAGNOSIS — N1831 Chronic kidney disease, stage 3a: Secondary | ICD-10-CM

## 2020-10-12 MED ORDER — BUPROPION HCL ER (XL) 150 MG PO TB24
150.0000 mg | ORAL_TABLET | Freq: Every day | ORAL | 3 refills | Status: DC
Start: 2020-10-12 — End: 2021-10-30

## 2020-10-12 MED ORDER — FLUOXETINE HCL 40 MG PO CAPS
ORAL_CAPSULE | ORAL | 3 refills | Status: DC
Start: 2020-10-12 — End: 2021-10-09

## 2020-10-12 MED ORDER — MONTELUKAST SODIUM 10 MG PO TABS
10.0000 mg | ORAL_TABLET | Freq: Every day | ORAL | 3 refills | Status: DC
Start: 2020-10-12 — End: 2021-10-30

## 2020-10-12 MED ORDER — ROSUVASTATIN CALCIUM 20 MG PO TABS
20.0000 mg | ORAL_TABLET | Freq: Every day | ORAL | 3 refills | Status: DC
Start: 2020-10-12 — End: 2021-10-30

## 2020-10-12 MED ORDER — ALPRAZOLAM 0.25 MG PO TABS
ORAL_TABLET | ORAL | 1 refills | Status: DC
Start: 2020-10-12 — End: 2020-12-16

## 2020-10-12 MED ORDER — MEMANTINE HCL 10 MG PO TABS: 10.0000 mg | ORAL_TABLET | Freq: Two times a day (BID) | ORAL | 3 refills | Status: DC

## 2020-10-12 MED ORDER — DONEPEZIL HCL 10 MG PO TABS
10.0000 mg | ORAL_TABLET | Freq: Every day | ORAL | 3 refills | Status: DC
Start: 2020-10-12 — End: 2021-04-03

## 2020-10-12 NOTE — Patient Instructions (Signed)
Please take all new medication as prescribed - the namenda for memory  Please remember to call for your pulmonary visit follow up  You will be contacted regarding the referral for: gastroenterology  Please continue to abstain completely from alcohol  Please continue all other medications as before, and only take medications as listed on the med list  Please have the pharmacy call with any other refills you may need.  Please continue your efforts at being more active, low cholesterol diet, and weight control.  You are otherwise up to date with prevention measures today.  Please keep your appointments with your specialists as you may have planned  Please go to the LAB at the blood drawing area for the tests to be done  You will be contacted by phone if any changes need to be made immediately.  Otherwise, you will receive a letter about your results with an explanation, but please check with MyChart first.  Please remember to sign up for MyChart if you have not done so, as this will be important to you in the future with finding out test results, communicating by private email, and scheduling acute appointments online when needed.  Please make an Appointment to return in 6 months, or sooner if needed

## 2020-10-12 NOTE — Progress Notes (Signed)
Patient ID: Andrea Larson, female   DOB: 06-03-1946, 75 y.o.   MRN: [id number]         Chief Complaint:: wellness exam and right rib pain, hx of colon polyps, copd, and dementia       HPI:  Andrea Larson is a 75 y.o. female here for wellness exam; for colonoscopy , and is o/w already up to date with preventive referrals and immunizations.                         Also here with son, who states memory loss is worsening, aricept no longer seems to be working well with repeated questioning about the same things.  Now has abstained from ETOH for over 6 months,  Had a slip and fall against a chair to the right ribs with neg xray recently but ribs still sore.  No other trauma or LOC.  No other recent falls.  Has not had pulm f/u recently, son will call for appt.  Also has hx of colon polyps and pt still interested in f/u colonoscopy as is due. Pt denies other chest pain, increased sob or doe, wheezing, orthopnea, PND, increased LE swelling, palpitations, dizziness or syncope.  Denies new focal neuro s/s.   Pt denies polydipsia, polyuria,  Pt denies fever, wt loss, night sweats, loss of appetite, or other constitutional symptoms  No other new complaints  Denies worsening depressive symptoms, suicidal ideation, or panic Wt Readings from Last 3 Encounters:  10/12/20 93 lb (42.2 kg)  09/22/20 93 lb 14.7 oz (42.6 kg)  08/09/20 94 lb (42.6 kg)   BP Readings from Last 3 Encounters:  10/12/20 120/84  09/23/20 125/83  08/09/20 (!) 142/75   Immunization History  Administered Date(s) Administered  . Fluad Quad(high Dose 65+) 04/15/2019, 04/05/2020  . Influenza Split 04/11/2011  . Influenza Whole 03/31/2008  . Influenza, High Dose Seasonal PF 04/29/2013, 03/18/2018  . Influenza,inj,Quad PF,6+ Mos 05/07/2014  . Influenza-Unspecified 05/13/2015, 04/19/2017  . PFIZER(Purple Top)SARS-COV-2 Vaccination 08/01/2019, 08/23/2019  . Pneumococcal Conjugate-13 05/13/2013  . Pneumococcal Polysaccharide-23  03/31/2008, 08/19/2017  . Td 02/10/2008  . Tdap 12/18/2015, 09/22/2020  . Zoster 07/09/2005, 01/24/2006  . Zoster Recombinat (Shingrix) 01/25/2017   Health Maintenance Due  Topic Date Due  . COLONOSCOPY (Pts 45-18yrs Insurance coverage will need to be confirmed)  04/19/2020      Past Medical History:  Diagnosis Date  . Allergic rhinitis   . Allergic rhinitis, cause unspecified 09/21/2011  . Aortic stenosis 08/23/2016  . Arthritis   . Asthma   . Asthma 09/21/2011  . Colon polyps   . COPD (chronic obstructive pulmonary disease) (HCC)    mild   . Coronary artery calcification 08/23/2016  . Dementia ([place])   . Depression with anxiety   . HTN (hypertension)   . Hyperlipidemia   . UTI (lower urinary tract infection)    Past Surgical History:  Procedure Laterality Date  . DENTAL SURGERY    . pilonidial cyst    . TONSILLECTOMY      reports that she quit smoking about 5 years ago. Her smoking use included cigarettes. She has a 41.25 pack-year smoking history. She has never used smokeless tobacco. She reports current alcohol use. She reports that she does not use drugs. family history includes Alcohol abuse in an other family member; Arthritis in an other family member; Atrial fibrillation in her brother; Heart disease in an other family member; Hypertension in her father and  another family member; Other in her mother; Stroke in her father and another family member. Allergies  Allergen Reactions  . Lipitor [Atorvastatin] Other (See Comments)    Myalgias   Current Outpatient Medications on File Prior to Visit  Medication Sig Dispense Refill  . acetaminophen (TYLENOL) 325 MG tablet Take 650 mg by mouth every 6 (six) hours as needed for mild pain or headache.    . feeding supplement, ENSURE ENLIVE, (ENSURE ENLIVE) LIQD Take 237 mLs by mouth 3 (three) times daily between meals. 237 mL   . levalbuterol (XOPENEX HFA) 45 MCG/ACT inhaler USE 2 PUFFS EVERY 6 HOURS AS NEEDED FOR WHEEZING.  **SHAKE WELL** 15 g 5  . levalbuterol (XOPENEX) 0.63 MG/3ML nebulizer solution USE 1 VIAL VIA NEBULIZER EVERY 6 HOURS AS NEEDED FOR WHEEZING OR SHORTNESS OF BREATH. 72 mL 5  . Respiratory Therapy Supplies (FLUTTER) DEVI Use as directed 1 each 0  . TRELEGY ELLIPTA 200-62.5-25 MCG/INH AEPB INHALE 1 PUFF INTO THE LUNGS ONCE DAILY AS DIRECTED. 60 each 5  . VENTOLIN HFA 108 (90 Base) MCG/ACT inhaler USE 2 PUFFS EVERY 6 HOURS AS NEEDED FOR WHEEZING OR SHORTNESS OF BREATH. (Patient taking differently: Inhale 2 puffs into the lungs every 6 (six) hours as needed for wheezing or shortness of breath.) 18 g 3   No current facility-administered medications on file prior to visit.        ROS:  All others reviewed and negative.  Objective        PE:  BP 120/84 (BP Location: Left Arm, Patient Position: Sitting, Cuff Size: Small)   Pulse 94   Ht 5\' 2"  (1.575 m)   Wt 93 lb (42.2 kg)   SpO2 93%   BMI 17.01 kg/m                 Constitutional: Pt appears in NAD               HENT: Head: NCAT.                Right Ear: External ear normal.                 Left Ear: External ear normal.                Eyes: . Pupils are equal, round, and reactive to light. Conjunctivae and EOM are normal               Nose: without d/c or deformity               Neck: Neck supple. Gross normal ROM               Cardiovascular: Normal rate and regular rhythm.                 Pulmonary/Chest: Effort normal and breath sounds without rales or wheezing.                Abd:  Soft, NT, ND, + BS, no organomegaly               Neurological: Pt is alert. At baseline orientation, motor grossly intact               Skin: Skin is warm. No rashes, no other new lesions, LE edema - none               Psychiatric: Pt behavior is normal without agitation   Micro: none  Cardiac tracings I have personally  interpreted today:  none  Pertinent Radiological findings (summarize): none   Lab Results  Component Value Date   WBC 6.3  10/12/2020   HGB 13.0 10/12/2020   HCT 39.2 10/12/2020   PLT 415.0 (H) 10/12/2020   GLUCOSE 88 10/12/2020   CHOL 137 10/12/2020   TRIG 128.0 10/12/2020   HDL 52.30 10/12/2020   LDLDIRECT 124.8 09/21/2011   LDLCALC 59 10/12/2020   ALT 24 10/12/2020   AST 29 10/12/2020   NA 136 10/12/2020   K 4.6 10/12/2020   CL 102 10/12/2020   CREATININE 1.03 10/12/2020   BUN 19 10/12/2020   CO2 28 10/12/2020   TSH 1.03 10/12/2020   HGBA1C 4.9 04/05/2020   Assessment/Plan:  DONN WILMOT is a 75 y.o. White or Caucasian [1] female with  has a past medical history of Allergic rhinitis, Allergic rhinitis, cause unspecified (09/21/2011), Aortic stenosis (08/23/2016), Arthritis, Asthma, Asthma (09/21/2011), Colon polyps, COPD (chronic obstructive pulmonary disease) ([place]), Coronary artery calcification (08/23/2016), Dementia ([place]), Depression with anxiety, HTN (hypertension), Hyperlipidemia, and UTI (lower urinary tract infection).  Encounter for well adult exam with abnormal findings Age and sex appropriate education and counseling updated with regular exercise and diet Referrals for preventative services - for colonoscopy Immunizations addressed - none needed Smoking counseling  - none needed Evidence for depression or other mood disorder - none significant, pt declines any change in tx Most recent labs reviewed. I have personally reviewed and have noted: 1) the patient's medical and social history 2) The patient's current medications and supplements 3) The patient's height, weight, and BMI have been recorded in the chart   COPD (chronic obstructive pulmonary disease) ([place]) Pt due for pulm f/u, son to help her call for appt  Dementia [facility]) With worsening recent, for add namenda 10 bid, cont all other tx, declines neuro referral  Hyperlipidemia Lab Results  Component Value Date   LDLCALC 59 10/12/2020   Stable, pt to continue current statin crestor 20   Hyperglycemia Lab Results   Component Value Date   HGBA1C 4.9 04/05/2020   Stable, pt to continue current medical treatment  - diet   Gait disorder With recent fall to a chair and rib soreness persists, no fx by films, improving slowly, declines PT referral for now  Essential hypertension BP Readings from Last 3 Encounters:  10/12/20 120/84  09/23/20 125/83  08/09/20 (!) 142/75   Stable, pt to continue medical treatment  - no current med tx   CKD (chronic kidney disease), stage III ([place]) Lab Results  Component Value Date   CREATININE 1.03 10/12/2020   Stable overall, cont to avoid nephrotoxins   History of colon polyps For colonoscopy  Vitamin D deficiency Last vitamin D Lab Results  Component Value Date   VD25OH 22.16 (L) 10/12/2020   Low to start oral replacement   Followup: Return in about 6 months (around 04/13/2021).  Cathlean Cower, MD 10/17/2020 1:49 AM [facility]

## 2020-10-13 ENCOUNTER — Encounter: Payer: Self-pay | Admitting: Internal Medicine

## 2020-10-13 LAB — BASIC METABOLIC PANEL
BUN: 19 mg/dL (ref 6–23)
CO2: 28 mEq/L (ref 19–32)
Calcium: 8.9 mg/dL (ref 8.4–10.5)
Chloride: 102 mEq/L (ref 96–112)
Creatinine, Ser: 1.03 mg/dL (ref 0.40–1.20)
GFR: 52.59 mL/min — ABNORMAL LOW (ref >60.00)
Glucose, Bld: 88 mg/dL (ref 70–99)
Potassium: 4.6 mEq/L (ref 3.5–5.1)
Sodium: 136 mEq/L (ref 135–145)

## 2020-10-13 LAB — URINALYSIS, ROUTINE W REFLEX MICROSCOPIC
Bilirubin Urine: NEGATIVE
Hgb urine dipstick: NEGATIVE
Nitrite: POSITIVE — AB
RBC / HPF: NONE SEEN (ref 0–?)
Specific Gravity, Urine: >=1.03 — AB (ref 1.000–1.030)
Total Protein, Urine: NEGATIVE
Urine Glucose: NEGATIVE
Urobilinogen, UA: 0.2 (ref 0.0–1.0)
pH: 5 (ref 5.0–8.0)

## 2020-10-13 LAB — HEPATIC FUNCTION PANEL
ALT: 24 U/L (ref 0–35)
AST: 29 U/L (ref 0–37)
Albumin: 3.7 g/dL (ref 3.5–5.2)
Alkaline Phosphatase: 102 U/L (ref 39–117)
Bilirubin, Direct: 0.1 mg/dL (ref 0.0–0.3)
Total Bilirubin: 0.2 mg/dL (ref 0.2–1.2)
Total Protein: 6.9 g/dL (ref 6.0–8.3)

## 2020-10-13 LAB — CBC WITH DIFFERENTIAL/PLATELET
Basophils Absolute: 0 10*3/uL (ref 0.0–0.1)
Basophils Relative: 0.7 % (ref 0.0–3.0)
Eosinophils Absolute: 0.1 10*3/uL (ref 0.0–0.7)
Eosinophils Relative: 1.6 % (ref 0.0–5.0)
HCT: 39.2 % (ref 36.0–46.0)
Hemoglobin: 13 g/dL (ref 12.0–15.0)
Lymphocytes Relative: 24.9 % (ref 12.0–46.0)
Lymphs Abs: 1.6 10*3/uL (ref 0.7–4.0)
MCHC: 33.3 g/dL (ref 30.0–36.0)
MCV: 94.4 fl (ref 78.0–100.0)
Monocytes Absolute: 0.6 10*3/uL (ref 0.1–1.0)
Monocytes Relative: 9.8 % (ref 3.0–12.0)
Neutro Abs: 4 10*3/uL (ref 1.4–7.7)
Neutrophils Relative %: 63 % (ref 43.0–77.0)
Platelets: 415 10*3/uL — ABNORMAL HIGH (ref 150.0–400.0)
RBC: 4.16 Mil/uL (ref 3.87–5.11)
RDW: 13.3 % (ref 11.5–15.5)
WBC: 6.3 10*3/uL (ref 4.0–10.5)

## 2020-10-13 LAB — TSH: TSH: 1.03 u[IU]/mL (ref 0.35–4.50)

## 2020-10-13 LAB — VITAMIN B12: Vitamin B-12: 542 pg/mL (ref 211–911)

## 2020-10-13 LAB — VITAMIN D 25 HYDROXY (VIT D DEFICIENCY, FRACTURES): VITD: 22.16 ng/mL — ABNORMAL LOW (ref 30.00–100.00)

## 2020-10-13 LAB — LIPID PANEL
Cholesterol: 137 mg/dL (ref 0–200)
HDL: 52.3 mg/dL (ref >39.00)
LDL Cholesterol: 59 mg/dL (ref 0–99)
NonHDL: 84.85
Total CHOL/HDL Ratio: 3
Triglycerides: 128 mg/dL (ref 0.0–149.0)
VLDL: 25.6 mg/dL (ref 0.0–40.0)

## 2020-10-17 ENCOUNTER — Encounter: Payer: Self-pay | Admitting: Internal Medicine

## 2020-10-17 DIAGNOSIS — E559 Vitamin D deficiency, unspecified: Secondary | ICD-10-CM | POA: Insufficient documentation

## 2020-10-17 DIAGNOSIS — Z86010 Personal history of colonic polyps: Secondary | ICD-10-CM | POA: Insufficient documentation

## 2020-10-17 NOTE — Assessment & Plan Note (Signed)
Last vitamin D Lab Results  Component Value Date   VD25OH 22.16 (L) 10/12/2020   Low to start oral replacement

## 2020-10-17 NOTE — Assessment & Plan Note (Signed)
Lab Results  Component Value Date   HGBA1C 4.9 04/05/2020   Stable, pt to continue current medical treatment  - diet

## 2020-10-17 NOTE — Assessment & Plan Note (Signed)
With worsening recent, for add namenda 10 bid, cont all other tx, declines neuro referral

## 2020-10-17 NOTE — Assessment & Plan Note (Signed)
With recent fall to a chair and rib soreness persists, no fx by films, improving slowly, declines PT referral for now

## 2020-10-17 NOTE — Assessment & Plan Note (Signed)
Age and sex appropriate education and counseling updated with regular exercise and diet Referrals for preventative services - for colonoscopy Immunizations addressed - none needed Smoking counseling  - none needed Evidence for depression or other mood disorder - none significant, pt declines any change in tx Most recent labs reviewed. I have personally reviewed and have noted: 1) the patient's medical and social history 2) The patient's current medications and supplements 3) The patient's height, weight, and BMI have been recorded in the chart

## 2020-10-17 NOTE — Assessment & Plan Note (Signed)
Pt due for pulm f/u, son to help her call for appt

## 2020-10-17 NOTE — Assessment & Plan Note (Signed)
For colonoscopy 

## 2020-10-17 NOTE — Assessment & Plan Note (Signed)
BP Readings from Last 3 Encounters:  10/12/20 120/84  09/23/20 125/83  08/09/20 (!) 142/75   Stable, pt to continue medical treatment  - no current med tx

## 2020-10-17 NOTE — Assessment & Plan Note (Signed)
Lab Results  Component Value Date   LDLCALC 59 10/12/2020   Stable, pt to continue current statin crestor 20

## 2020-10-17 NOTE — Assessment & Plan Note (Signed)
Lab Results  Component Value Date   CREATININE 1.03 10/12/2020   Stable overall, cont to avoid nephrotoxins

## 2020-11-09 DIAGNOSIS — J441 Chronic obstructive pulmonary disease with (acute) exacerbation: Secondary | ICD-10-CM | POA: Diagnosis not present

## 2020-11-09 DIAGNOSIS — J438 Other emphysema: Secondary | ICD-10-CM | POA: Diagnosis not present

## 2020-11-09 DIAGNOSIS — J449 Chronic obstructive pulmonary disease, unspecified: Secondary | ICD-10-CM | POA: Diagnosis not present

## 2020-12-01 DIAGNOSIS — F0280 Dementia in other diseases classified elsewhere without behavioral disturbance: Secondary | ICD-10-CM | POA: Diagnosis not present

## 2020-12-10 DIAGNOSIS — J441 Chronic obstructive pulmonary disease with (acute) exacerbation: Secondary | ICD-10-CM | POA: Diagnosis not present

## 2020-12-10 DIAGNOSIS — J449 Chronic obstructive pulmonary disease, unspecified: Secondary | ICD-10-CM | POA: Diagnosis not present

## 2020-12-10 DIAGNOSIS — J438 Other emphysema: Secondary | ICD-10-CM | POA: Diagnosis not present

## 2020-12-16 ENCOUNTER — Ambulatory Visit (INDEPENDENT_AMBULATORY_CARE_PROVIDER_SITE_OTHER): Payer: Medicare HMO | Admitting: Internal Medicine

## 2020-12-16 ENCOUNTER — Other Ambulatory Visit: Payer: Self-pay

## 2020-12-16 ENCOUNTER — Encounter: Payer: Self-pay | Admitting: Internal Medicine

## 2020-12-16 VITALS — BP 140/78 | HR 92 | Temp 99.5°F | Ht 62.0 in | Wt 92.0 lb

## 2020-12-16 DIAGNOSIS — R739 Hyperglycemia, unspecified: Secondary | ICD-10-CM | POA: Diagnosis not present

## 2020-12-16 DIAGNOSIS — E559 Vitamin D deficiency, unspecified: Secondary | ICD-10-CM

## 2020-12-16 DIAGNOSIS — F341 Dysthymic disorder: Secondary | ICD-10-CM

## 2020-12-16 DIAGNOSIS — R410 Disorientation, unspecified: Secondary | ICD-10-CM | POA: Diagnosis not present

## 2020-12-16 DIAGNOSIS — I10 Essential (primary) hypertension: Secondary | ICD-10-CM

## 2020-12-16 DIAGNOSIS — F1027 Alcohol dependence with alcohol-induced persisting dementia: Secondary | ICD-10-CM | POA: Diagnosis not present

## 2020-12-16 LAB — URINALYSIS, ROUTINE W REFLEX MICROSCOPIC
Bilirubin Urine: NEGATIVE
Ketones, ur: 15 — AB
Nitrite: POSITIVE — AB
Specific Gravity, Urine: >=1.03 — AB (ref 1.000–1.030)
Total Protein, Urine: NEGATIVE
Urine Glucose: NEGATIVE
Urobilinogen, UA: 0.2 (ref 0.0–1.0)
pH: 6 (ref 5.0–8.0)

## 2020-12-16 MED ORDER — AMLODIPINE BESYLATE 5 MG PO TABS
5.0000 mg | ORAL_TABLET | Freq: Every day | ORAL | 3 refills | Status: DC
Start: 2020-12-16 — End: 2021-11-14

## 2020-12-16 MED ORDER — THERA-D 2000 50 MCG (2000 UT) PO TABS: ORAL_TABLET | ORAL | 99 refills | Status: DC

## 2020-12-16 MED ORDER — CLONAZEPAM 0.5 MG PO TABS: 0.5000 mg | ORAL_TABLET | Freq: Two times a day (BID) | ORAL | 2 refills | Status: DC | PRN

## 2020-12-16 NOTE — Patient Instructions (Addendum)
Please take OTC Vitamin D3 at 2000 units per day, indefinitely  Please take all new medication as prescribed - the amlodipine 5 mg per day  Ok to stop the xanax  Please take all new medication as prescribed - the clonazepam at 0.5 mg twice per day as needed  Please continue all other medications as before, and refills have been done if requested.  Please have the pharmacy call with any other refills you may need.  Please keep your appointments with your specialists as you may have planned  Please go to the LAB at the blood drawing area for the tests to be done - just the urine testing today  You will be contacted by phone if any changes need to be made immediately.  Otherwise, you will receive a letter about your results with an explanation, but please check with MyChart first.  Please remember to sign up for MyChart if you have not done so, as this will be important to you in the future with finding out test results, communicating by private email, and scheduling acute appointments online when needed.  Please make an Appointment to return in 3 months

## 2020-12-16 NOTE — Progress Notes (Signed)
Patient ID: Andrea Larson, female   DOB: 1945/08/27, 75 y.o.   MRN: [id number]        Chief Complaint: with family with dementia, possible worsening confusion recently, chronic anxiety, htn, low vit d       HPI:  Andrea Larson is a 75 y.o. female here with family, has ongoing dementia and 1 mo mild worsening behavioral change somewhat with increased agitation and xanax no longer seems to be controlling well; BP at home has been at times up to 200 with agitation and better with time, xanax with resolving to the 140's to 150's sbp. Not taking Vit D.  Pt denies chest pain, increased sob or doe, wheezing, orthopnea, PND, increased LE swelling, palpitations, dizziness or syncope.   Pt denies polydipsia, polyuria, or other new focal neuro s/s.  Denies urinary symptoms such as dysuria, frequency, urgency, flank pain, hematuria or n/v, fever, chills.  No cough or ST.  Wt overall stable.    Wt Readings from Last 3 Encounters:  12/16/20 92 lb (41.7 kg)  10/12/20 93 lb (42.2 kg)  09/22/20 93 lb 14.7 oz (42.6 kg)   BP Readings from Last 3 Encounters:  12/16/20 140/78  10/12/20 120/84  09/23/20 125/83         Past Medical History:  Diagnosis Date   Allergic rhinitis    Allergic rhinitis, cause unspecified 09/21/2011   Aortic stenosis 08/23/2016   Arthritis    Asthma    Asthma 09/21/2011   Colon polyps    COPD (chronic obstructive pulmonary disease) (HCC)    mild    Coronary artery calcification 08/23/2016   Dementia (HCC)    Depression with anxiety    HTN (hypertension)    Hyperlipidemia    UTI (lower urinary tract infection)    Past Surgical History:  Procedure Laterality Date   DENTAL SURGERY     pilonidial cyst     TONSILLECTOMY      reports that she quit smoking about 5 years ago. Her smoking use included cigarettes. She has a 41.25 pack-year smoking history. She has never used smokeless tobacco. She reports current alcohol use. She reports that she does not use drugs. family  history includes Alcohol abuse in an other family member; Arthritis in an other family member; Atrial fibrillation in her brother; Heart disease in an other family member; Hypertension in her father and another family member; Other in her mother; Stroke in her father and another family member. Allergies  Allergen Reactions   Lipitor [Atorvastatin] Other (See Comments)    Myalgias   Current Outpatient Medications on File Prior to Visit  Medication Sig Dispense Refill   acetaminophen (TYLENOL) 325 MG tablet Take 650 mg by mouth every 6 (six) hours as needed for mild pain or headache.     buPROPion (WELLBUTRIN XL) 150 MG 24 hr tablet Take 1 tablet (150 mg total) by mouth daily. 90 tablet 3   donepezil (ARICEPT) 10 MG tablet Take 1 tablet (10 mg total) by mouth at bedtime. 90 tablet 3   feeding supplement, ENSURE ENLIVE, (ENSURE ENLIVE) LIQD Take 237 mLs by mouth 3 (three) times daily between meals. 237 mL    FLUoxetine (PROZAC) 40 MG capsule TAKE (1) CAPSULE DAILY. 90 capsule 3   levalbuterol (XOPENEX HFA) 45 MCG/ACT inhaler USE 2 PUFFS EVERY 6 HOURS AS NEEDED FOR WHEEZING. **SHAKE WELL** 15 g 5   levalbuterol (XOPENEX) 0.63 MG/3ML nebulizer solution USE 1 VIAL VIA NEBULIZER EVERY 6 HOURS AS NEEDED  FOR WHEEZING OR SHORTNESS OF BREATH. 72 mL 5   memantine (NAMENDA) 10 MG tablet Take 1 tablet (10 mg total) by mouth 2 (two) times daily. 180 tablet 3   montelukast (SINGULAIR) 10 MG tablet Take 1 tablet (10 mg total) by mouth daily. 90 tablet 3   Respiratory Therapy Supplies (FLUTTER) DEVI Use as directed 1 each 0   rosuvastatin (CRESTOR) 20 MG tablet Take 1 tablet (20 mg total) by mouth daily. 90 tablet 3   TRELEGY ELLIPTA 200-62.5-25 MCG/INH AEPB INHALE 1 PUFF INTO THE LUNGS ONCE DAILY AS DIRECTED. 60 each 5   VENTOLIN HFA 108 (90 Base) MCG/ACT inhaler USE 2 PUFFS EVERY 6 HOURS AS NEEDED FOR WHEEZING OR SHORTNESS OF BREATH. (Patient taking differently: Inhale 2 puffs into the lungs every 6 (six) hours  as needed for wheezing or shortness of breath.) 18 g 3   No current facility-administered medications on file prior to visit.        ROS:  All others reviewed and negative.  Objective        PE:  BP 140/78 (BP Location: Left Arm, Patient Position: Sitting, Cuff Size: Normal)   Pulse 92   Temp 99.5 F (37.5 C) (Oral)   Ht 5\' 2"  (1.575 m)   Wt 92 lb (41.7 kg)   SpO2 95%   BMI 16.83 kg/m                 Constitutional: Pt appears in NAD               HENT: Head: NCAT.                Right Ear: External ear normal.                 Left Ear: External ear normal.                Eyes: . Pupils are equal, round, and reactive to light. Conjunctivae and EOM are normal               Nose: without d/c or deformity               Neck: Neck supple. Gross normal ROM               Cardiovascular: Normal rate and regular rhythm.                 Pulmonary/Chest: Effort normal and breath sounds without rales or wheezing.                Abd:  Soft, NT, ND, + BS, no organomegaly               Neurological: Pt is alert. At baseline orientation, motor grossly intact               Skin: Skin is warm. No rashes, no other new lesions, LE edema - none               Psychiatric: Pt behavior is without agitation but with nonsensical sstatements, tense demeanor, inappropriate laughter at times  Micro: none  Cardiac tracings I have personally interpreted today:  none  Pertinent Radiological findings (summarize): none   Lab Results  Component Value Date   WBC 6.3 10/12/2020   HGB 13.0 10/12/2020   HCT 39.2 10/12/2020   PLT 415.0 (H) 10/12/2020   GLUCOSE 88 10/12/2020   CHOL 137 10/12/2020   TRIG 128.0 10/12/2020   HDL 52.30  10/12/2020   LDLDIRECT 124.8 09/21/2011   LDLCALC 59 10/12/2020   ALT 24 10/12/2020   AST 29 10/12/2020   NA 136 10/12/2020   K 4.6 10/12/2020   CL 102 10/12/2020   CREATININE 1.03 10/12/2020   BUN 19 10/12/2020   CO2 28 10/12/2020   TSH 1.03 10/12/2020   HGBA1C 4.9  04/05/2020   Assessment/Plan:  Andrea Larson is a 75 y.o. White or Caucasian [1] female with  has a past medical history of Allergic rhinitis, Allergic rhinitis, cause unspecified (09/21/2011), Aortic stenosis (08/23/2016), Arthritis, Asthma, Asthma (09/21/2011), Colon polyps, COPD (chronic obstructive pulmonary disease) ([place]), Coronary artery calcification (08/23/2016), Dementia ([place]), Depression with anxiety, HTN (hypertension), Hyperlipidemia, and UTI (lower urinary tract infection).  Dementia ([place]) ? Mild worsening behavioral issue, cont namenda and continue to monitor for now  ANXIETY DEPRESSION ? Mild uncontrolled, to continue wellbuturin, and change xanax to klonopin 0.5 bid prn,  to f/u any worsening symptoms or concerns  Vitamin D deficiency Last vitamin D Lab Results  Component Value Date   VD25OH 22.16 (L) 10/12/2020   Low, to start oral replacement   Essential hypertension Mild uncontrolled,  BP Readings from Last 3 Encounters:  12/16/20 140/78  10/12/20 120/84  09/23/20 125/83  for start amlodipine 5 qd,  to f/u any worsening symptoms or concerns  Hyperglycemia Lab Results  Component Value Date   HGBA1C 4.9 04/05/2020   Stable, pt to continue current medical treatment  - diet   Confusion Also for urine studies r/o uti  Followup: Return in about 3 months (around 03/18/2021).  Cathlean Cower, MD 12/17/2020 5:25 PM [facility]

## 2020-12-17 ENCOUNTER — Encounter: Payer: Self-pay | Admitting: Internal Medicine

## 2020-12-17 DIAGNOSIS — R410 Disorientation, unspecified: Secondary | ICD-10-CM | POA: Insufficient documentation

## 2020-12-17 NOTE — Assessment & Plan Note (Signed)
Lab Results  Component Value Date   HGBA1C 4.9 04/05/2020   Stable, pt to continue current medical treatment  - diet

## 2020-12-17 NOTE — Assessment & Plan Note (Signed)
Last vitamin D Lab Results  Component Value Date   VD25OH 22.16 (L) 10/12/2020   Low, to start oral replacement

## 2020-12-17 NOTE — Assessment & Plan Note (Signed)
Also for urine studies r/o uti

## 2020-12-17 NOTE — Assessment & Plan Note (Signed)
?   Mild worsening behavioral issue, cont namenda and continue to monitor for now

## 2020-12-17 NOTE — Assessment & Plan Note (Signed)
Mild uncontrolled,  BP Readings from Last 3 Encounters:  12/16/20 140/78  10/12/20 120/84  09/23/20 125/83  for start amlodipine 5 qd,  to f/u any worsening symptoms or concerns

## 2020-12-17 NOTE — Assessment & Plan Note (Signed)
?   Mild uncontrolled, to continue wellbuturin, and change xanax to klonopin 0.5 bid prn,  to f/u any worsening symptoms or concerns

## 2020-12-18 ENCOUNTER — Encounter: Payer: Self-pay | Admitting: Internal Medicine

## 2020-12-18 ENCOUNTER — Other Ambulatory Visit: Payer: Self-pay | Admitting: Internal Medicine

## 2020-12-18 LAB — URINE CULTURE

## 2020-12-18 MED ORDER — CEPHALEXIN 500 MG PO CAPS: 500.0000 mg | ORAL_CAPSULE | Freq: Three times a day (TID) | ORAL | 0 refills | Status: AC

## 2020-12-31 ENCOUNTER — Other Ambulatory Visit: Payer: Self-pay | Admitting: Internal Medicine

## 2020-12-31 NOTE — Telephone Encounter (Signed)
Please refill as per office routine med refill policy (all routine meds refilled for 3 mo or monthly per pt preference up to one year from last visit, then month to month grace period for 3 mo, then further med refills will have to be denied)  

## 2021-01-09 DIAGNOSIS — J441 Chronic obstructive pulmonary disease with (acute) exacerbation: Secondary | ICD-10-CM | POA: Diagnosis not present

## 2021-01-09 DIAGNOSIS — J449 Chronic obstructive pulmonary disease, unspecified: Secondary | ICD-10-CM | POA: Diagnosis not present

## 2021-01-09 DIAGNOSIS — J438 Other emphysema: Secondary | ICD-10-CM | POA: Diagnosis not present

## 2021-01-26 ENCOUNTER — Encounter: Payer: Self-pay | Admitting: Gastroenterology

## 2021-02-07 ENCOUNTER — Telehealth: Payer: Self-pay | Admitting: *Deleted

## 2021-02-07 NOTE — Telephone Encounter (Signed)
This 75 y.o. patient uses Oxygen and has hx of Dementia with a recent ED visit for a fall. She will need an OV before colonoscopy. Called patient's son. Left a message for him to call us back to make an OV.

## 2021-02-08 NOTE — Telephone Encounter (Signed)
Called pt's son, no answer. Left a message for him to call us back to make an OV.

## 2021-02-09 NOTE — Telephone Encounter (Signed)
Attempted to reach son - to schedule OV for pt due to use of O2, dementia, and recent ED visit for fall.  [facility]MM requesting a call back to schedule and OV for pt.

## 2021-02-13 ENCOUNTER — Encounter: Payer: Self-pay | Admitting: Internal Medicine

## 2021-02-13 NOTE — Telephone Encounter (Signed)
Spoke with patient's son JC and care giver. Explained that the patient needs OV before colonoscopy due to oxygen use and dementia. OV made with care giver for 9/27, pv and colon cx-care giver and son aware.

## 2021-02-16 ENCOUNTER — Encounter: Payer: Self-pay | Admitting: Gastroenterology

## 2021-02-26 ENCOUNTER — Emergency Department (HOSPITAL_COMMUNITY): Payer: Medicare HMO

## 2021-02-26 ENCOUNTER — Encounter (HOSPITAL_COMMUNITY): Payer: Self-pay | Admitting: Oncology

## 2021-02-26 ENCOUNTER — Other Ambulatory Visit: Payer: Self-pay

## 2021-02-26 ENCOUNTER — Emergency Department (HOSPITAL_COMMUNITY)
Admission: EM | Admit: 2021-02-26 | Discharge: 2021-02-26 | Disposition: A | Discharge status: Home / Self Care | Payer: Medicare HMO | Attending: Emergency Medicine | Admitting: Emergency Medicine

## 2021-02-26 DIAGNOSIS — Z23 Encounter for immunization: Secondary | ICD-10-CM | POA: Insufficient documentation

## 2021-02-26 DIAGNOSIS — M47814 Spondylosis without myelopathy or radiculopathy, thoracic region: Secondary | ICD-10-CM | POA: Diagnosis not present

## 2021-02-26 DIAGNOSIS — I739 Peripheral vascular disease, unspecified: Secondary | ICD-10-CM | POA: Diagnosis not present

## 2021-02-26 DIAGNOSIS — S2231XA Fracture of one rib, right side, initial encounter for closed fracture: Secondary | ICD-10-CM | POA: Diagnosis not present

## 2021-02-26 DIAGNOSIS — S51811A Laceration without foreign body of right forearm, initial encounter: Secondary | ICD-10-CM | POA: Diagnosis not present

## 2021-02-26 DIAGNOSIS — S51819A Laceration without foreign body of unspecified forearm, initial encounter: Secondary | ICD-10-CM

## 2021-02-26 DIAGNOSIS — S2241XA Multiple fractures of ribs, right side, initial encounter for closed fracture: Secondary | ICD-10-CM | POA: Diagnosis not present

## 2021-02-26 DIAGNOSIS — W19XXXA Unspecified fall, initial encounter: Secondary | ICD-10-CM | POA: Insufficient documentation

## 2021-02-26 DIAGNOSIS — S41111A Laceration without foreign body of right upper arm, initial encounter: Secondary | ICD-10-CM | POA: Diagnosis not present

## 2021-02-26 DIAGNOSIS — Y9289 Other specified places as the place of occurrence of the external cause: Secondary | ICD-10-CM | POA: Insufficient documentation

## 2021-02-26 DIAGNOSIS — J441 Chronic obstructive pulmonary disease with (acute) exacerbation: Secondary | ICD-10-CM | POA: Diagnosis not present

## 2021-02-26 DIAGNOSIS — F0390 Unspecified dementia without behavioral disturbance: Secondary | ICD-10-CM | POA: Insufficient documentation

## 2021-02-26 DIAGNOSIS — S299XXA Unspecified injury of thorax, initial encounter: Secondary | ICD-10-CM | POA: Diagnosis present

## 2021-02-26 DIAGNOSIS — Z79899 Other long term (current) drug therapy: Secondary | ICD-10-CM | POA: Insufficient documentation

## 2021-02-26 DIAGNOSIS — Z7951 Long term (current) use of inhaled steroids: Secondary | ICD-10-CM | POA: Diagnosis not present

## 2021-02-26 DIAGNOSIS — S0990XA Unspecified injury of head, initial encounter: Secondary | ICD-10-CM | POA: Diagnosis not present

## 2021-02-26 DIAGNOSIS — J45909 Unspecified asthma, uncomplicated: Secondary | ICD-10-CM | POA: Insufficient documentation

## 2021-02-26 DIAGNOSIS — Z87891 Personal history of nicotine dependence: Secondary | ICD-10-CM | POA: Diagnosis not present

## 2021-02-26 DIAGNOSIS — N183 Chronic kidney disease, stage 3 unspecified: Secondary | ICD-10-CM | POA: Diagnosis not present

## 2021-02-26 DIAGNOSIS — M19011 Primary osteoarthritis, right shoulder: Secondary | ICD-10-CM | POA: Diagnosis not present

## 2021-02-26 DIAGNOSIS — I129 Hypertensive chronic kidney disease with stage 1 through stage 4 chronic kidney disease, or unspecified chronic kidney disease: Secondary | ICD-10-CM | POA: Insufficient documentation

## 2021-02-26 DIAGNOSIS — G319 Degenerative disease of nervous system, unspecified: Secondary | ICD-10-CM | POA: Diagnosis not present

## 2021-02-26 MED ORDER — TETANUS-DIPHTH-ACELL PERTUSSIS 5-2.5-18.5 LF-MCG/0.5 IM SUSY
0.5000 mL | PREFILLED_SYRINGE | Freq: Once | INTRAMUSCULAR | Status: AC
  Administered 2021-02-26: 0.5 mL via INTRAMUSCULAR
  Filled 2021-02-26: qty 0.5

## 2021-02-26 MED ORDER — OXYCODONE-ACETAMINOPHEN 5-325 MG PO TABS
1.0000 | ORAL_TABLET | Freq: Once | ORAL | Status: AC
  Administered 2021-02-26: 1 via ORAL
  Filled 2021-02-26: qty 1

## 2021-02-26 MED ORDER — LIDOCAINE 4 % EX PTCH: 1.0000 | MEDICATED_PATCH | Freq: Two times a day (BID) | CUTANEOUS | 0 refills | Status: DC | PRN

## 2021-02-26 MED ORDER — LIDOCAINE 5 % EX PTCH
1.0000 | MEDICATED_PATCH | CUTANEOUS | Status: DC
  Administered 2021-02-26: 1 via TRANSDERMAL
  Filled 2021-02-26: qty 1

## 2021-02-26 NOTE — ED Provider Notes (Signed)
Emergency Medicine Provider Triage Evaluation Note  Andrea Larson , a 75 y.o. female  was evaluated in triage.  Pt complains of right rib cage pain after suffering a fall yesterday night.  Patient suffered unwitnessed fall onto hardwood floor last night.  Family member heard the fall and came into room directly after fall to find patient alert and oriented to baseline.  Patient denies hitting her head or any loss of consciousness.  Patient planes of pain to right rib cage.  Denies any neck pain, back pain, numbness, weakness, saddle anesthesia, bowel or bladder dysfunction.  Patient is not on any blood thinners.  Patient has history of dementia is at mental baseline per family member at bedside.  Review of Systems  Positive: Myalgia Negative: neck pain, back pain, numbness, weakness, saddle anesthesia, bowel or bladder dysfunction, syncope  Physical Exam  BP 119/80 (BP Location: Left Arm)   Pulse 79   Temp 98.4 F (36.9 C) (Oral)   Resp 16   Wt 54 kg   SpO2 100%   BMI 21.77 kg/m  Gen:   Awake, no distress, alert to person and place Resp:  Normal effort  MSK:   Moves extremities without difficulty; no midline tenderness or deformity to cervical, thoracic, or lumbar spine. Other:  Skin tears noted to the right upper extremity.  Tenderness to right rib cage with no ecchymosis or deformity noted.  Medical Decision Making  Medically screening exam initiated at 5:16 PM.  Appropriate orders placed.  Andrea Larson was informed that the remainder of the evaluation will be completed by another provider, this initial triage assessment does not replace that evaluation, and the importance of remaining in the ED until their evaluation is complete.  The patient appears stable so that the remainder of the work up may be completed by another provider.      Loni Beckwith, PA-C 02/26/21 1718    Lajean Saver, MD 03/01/21 [phone]

## 2021-02-26 NOTE — ED Provider Notes (Addendum)
[facility] DEPT Provider Note   CSN: [id number] Arrival date & time: 02/26/21  1614     History Chief Complaint  Patient presents with   Andrea Larson    Andrea Larson is a 75 y.o. female with history of dementia, hypertension, hyperlipidemia, COPD, gait abnormality.  Presents to the ED with chief complaint of right rib pain after suffering a fall last night.  Patient has a history of dementia and at baseline is alert to person and, place, and time intermittently.  Patient's son is at bedside and reports that patient suffered a fall last night that was unwitnessed.  Patient's brother heard the fall and was in the room immediately after.  Reports that patient was alert and oriented on his arrival.  Reports patient has been complaining of pain to right ribs since her fall.  Patient has not received any pain medication at home.  Patient is not on any blood thinners.  Unsure when patient's last tetanus shot was.  Patient denies hitting her head or any loss of consciousness.  Denies neck pain, back pain, numbness, weakness, saddle anesthesia, bowel or bladder dysfunction, nausea, vomiting, headache, visual disturbance.  Patient has history of COPD and is on O2 continuously.   Fall Pertinent negatives include no chest pain, no abdominal pain, no headaches and no shortness of breath.      Past Medical History:  Diagnosis Date   Allergic rhinitis    Allergic rhinitis, cause unspecified 09/21/2011   Aortic stenosis 08/23/2016   Arthritis    Asthma    Asthma 09/21/2011   Colon polyps    COPD (chronic obstructive pulmonary disease) (HCC)    mild    Coronary artery calcification 08/23/2016   Dementia ([place])    Depression with anxiety    HTN (hypertension)    Hyperlipidemia    UTI (lower urinary tract infection)     Patient Active Problem List   Diagnosis Date Noted   Confusion 12/17/2020   History of colon polyps 10/17/2020   Vitamin D deficiency 10/17/2020    Dementia without behavioral disturbance ([place]) 08/09/2020   Gait abnormality 08/09/2020   Chronic hypoxemic respiratory failure ([place]) 04/10/2020   Dementia ([place]) 06/24/2018   Memory loss 06/24/2018   Weight loss 06/24/2018   Peripheral edema 05/21/2018   Gait disorder 05/21/2018   C. difficile colitis 05/06/2018   Clostridium difficile diarrhea    Acute diarrhea 05/02/2018   Diarrhea 04/28/2018   CKD (chronic kidney disease), stage III ([place]) 04/28/2018   Diarrhea due to drug 04/21/2018   Malnutrition of moderate degree 04/13/2018   ARF (acute renal failure) ([place]) 04/11/2018   Right wrist pain 04/11/2018   Essential hypertension 04/11/2018   Gallstones 04/11/2018   Hypokalemia 03/21/2018   UTI (urinary tract infection) 02/19/2018   AKI (acute kidney injury) ([place]) 02/19/2018   Coronary artery calcification 08/23/2016   Aortic stenosis 08/23/2016   Chest pain 07/20/2016   Hyperglycemia 07/20/2016   Insomnia 08/18/2015   GERD (gastroesophageal reflux disease) 08/18/2015   Acute bronchitis 02/15/2015   COPD exacerbation ([place]) 02/15/2015   Tachycardia 10/28/2012   Back pain 10/28/2012   Asthma 09/21/2011   Allergic rhinitis 09/21/2011   Colon polyps 09/21/2011   Fatigue 09/21/2011   Encounter for well adult exam with abnormal findings 09/15/2011   SYNCOPE 06/20/2010   DEGENERATIVE JOINT DISEASE, CERVICAL SPINE 10/21/2008   Overweight(278.02) 02/10/2008   ANXIETY DEPRESSION 02/10/2008   COPD (chronic obstructive pulmonary disease) ([place]) 02/10/2008   EMPHYSEMA NEC  01/30/2007   Hyperlipidemia 12/31/2006    Past Surgical History:  Procedure Laterality Date   DENTAL SURGERY     pilonidial cyst     TONSILLECTOMY       OB History   No obstetric history on file.     Family History  Problem Relation Age of Onset   Alcohol abuse Other    Arthritis Other    Heart disease Other    Stroke Other    Hypertension Other    Hypertension Father    Stroke Father    Atrial  fibrillation Brother    Other Mother        unsure of medical history   Colon cancer Neg Hx     Social History   Tobacco Use   Smoking status: Former    Packs/day: 0.75    Years: 55.00    Pack years: 41.25    Types: Cigarettes    Quit date: 04/18/2015    Years since quitting: 5.8   Smokeless tobacco: Never  Vaping Use   Vaping Use: Never used  Substance Use Topics   Alcohol use: Yes    Comment: wine couple times each week - daily at times   Drug use: Never    Home Medications Prior to Admission medications   Medication Sig Start Date End Date Taking? Authorizing Provider  acetaminophen (TYLENOL) 325 MG tablet Take 650 mg by mouth every 6 (six) hours as needed for mild pain or headache.    [provider]  amLODipine (NORVASC) 5 MG tablet Take 1 tablet (5 mg total) by mouth daily. 12/16/20 12/16/21  Biagio Borg, MD  buPROPion (WELLBUTRIN XL) 150 MG 24 hr tablet Take 1 tablet (150 mg total) by mouth daily. 10/12/20   Biagio Borg, MD  Cholecalciferol (THERA-D 2000) 50 MCG (2000 UT) TABS 1 tab by mouth once daily 12/16/20   Biagio Borg, MD  clonazePAM (KLONOPIN) 0.5 MG tablet Take 1 tablet (0.5 mg total) by mouth 2 (two) times daily as needed for anxiety. 12/16/20   Biagio Borg, MD  donepezil (ARICEPT) 10 MG tablet Take 1 tablet (10 mg total) by mouth at bedtime. 10/12/20   Biagio Borg, MD  feeding supplement, ENSURE ENLIVE, (ENSURE ENLIVE) LIQD Take 237 mLs by mouth 3 (three) times daily between meals. 04/21/18   Biagio Borg, MD  FLUoxetine (PROZAC) 40 MG capsule TAKE (1) CAPSULE DAILY. 10/12/20   Biagio Borg, MD  levalbuterol [facility] HFA) 45 MCG/ACT inhaler USE 2 PUFFS EVERY 6 HOURS AS NEEDED FOR WHEEZING. **SHAKE WELL** 07/07/19   Mannam, Praveen, MD  levalbuterol (XOPENEX) 0.63 MG/3ML nebulizer solution USE 1 VIAL VIA NEBULIZER EVERY 6 HOURS AS NEEDED FOR WHEEZING OR SHORTNESS OF BREATH. 04/15/19   Mannam, Hart Robinsons, MD  memantine (NAMENDA) 10 MG tablet Take 1 tablet  (10 mg total) by mouth 2 (two) times daily. 10/12/20   Biagio Borg, MD  montelukast (SINGULAIR) 10 MG tablet Take 1 tablet (10 mg total) by mouth daily. 10/12/20   Biagio Borg, MD  Respiratory Therapy Supplies (FLUTTER) DEVI Use as directed 08/19/17   Marshell Garfinkel, MD  rosuvastatin (CRESTOR) 20 MG tablet Take 1 tablet (20 mg total) by mouth daily. 10/12/20   Biagio Borg, MD  SPIRIVA RESPIMAT 2.5 MCG/ACT AERS USE 1 PUFF DAILY AS DIRECTED 01/02/21   Biagio Borg, MD  TRELEGY ELLIPTA 200-62.5-25 MCG/INH AEPB INHALE 1 PUFF INTO THE LUNGS ONCE DAILY AS DIRECTED. 08/02/20  Mannam, Praveen, MD  VENTOLIN HFA 108 (90 Base) MCG/ACT inhaler USE 2 PUFFS EVERY 6 HOURS AS NEEDED FOR WHEEZING OR SHORTNESS OF BREATH. Patient taking differently: Inhale 2 puffs into the lungs every 6 (six) hours as needed for wheezing or shortness of breath. 05/16/20   Marshell Garfinkel, MD    Allergies    Lipitor [atorvastatin]  Review of Systems   Review of Systems  Constitutional:  Negative for chills and fever.  HENT:  Negative for facial swelling.   Eyes:  Negative for visual disturbance.  Respiratory:  Negative for shortness of breath.   Cardiovascular:  Negative for chest pain.  Gastrointestinal:  Negative for abdominal pain, nausea and vomiting.  Genitourinary:  Negative for difficulty urinating.  Musculoskeletal:  Positive for myalgias. Negative for back pain, neck pain and neck stiffness.  Skin:  Positive for wound. Negative for color change and rash.  Neurological:  Negative for dizziness, tremors, seizures, syncope, facial asymmetry, speech difficulty, weakness, light-headedness, numbness and headaches.  Psychiatric/Behavioral:  Negative for confusion.    Physical Exam Updated Vital Signs BP 119/80 (BP Location: Left Arm)   Pulse 79   Temp 98.4 F (36.9 C) (Oral)   Resp 16   Ht '5\' 4"'$  (1.626 m)   Wt 42.2 kg   SpO2 100%   BMI 15.96 kg/m   Physical Exam Vitals and nursing note reviewed.   Constitutional:      General: She is not in acute distress.    Appearance: She is not ill-appearing, toxic-appearing or diaphoretic.     Interventions: Nasal cannula in place.     Comments: On 2 LPM of O2 via nasal cannula  HENT:     Head: Normocephalic and atraumatic. No raccoon eyes, Battle's sign, abrasion, contusion, masses, right periorbital erythema, left periorbital erythema or laceration.     Jaw: No trismus or tenderness.  Eyes:     General: No scleral icterus.       Right eye: No discharge.        Left eye: No discharge.     Extraocular Movements: Extraocular movements intact.     Conjunctiva/sclera: Conjunctivae normal.     Pupils: Pupils are equal, round, and reactive to light.  Cardiovascular:     Rate and Rhythm: Normal rate.  Pulmonary:     Effort: Pulmonary effort is normal.  Chest:     Chest wall: Tenderness present. No mass, lacerations, deformity, swelling, crepitus or edema.     Comments: Tenderness to right chest wall Abdominal:     Palpations: Abdomen is soft.     Tenderness: There is no abdominal tenderness.  Musculoskeletal:     Cervical back: Normal range of motion and neck supple. No rigidity.     Comments: No midline tenderness or deformity to cervical, thoracic, lumbar spine.  No tenderness, bony tenderness, or deformity of bilateral lower extremities, and left upper extremity.  Patient has tenderness over skin tears to right upper extremity.  Pelvis stable.    Skin:    General: Skin is warm and dry.     Findings: Signs of injury present.     Comments: Extensive skin tear noted to right forearm and right upper arm  Neurological:     General: No focal deficit present.     Mental Status: She is alert.     GCS: GCS eye subscore is 4. GCS verbal subscore is 5. GCS motor subscore is 6.     Cranial Nerves: No cranial nerve deficit or facial  asymmetry.     Sensory: Sensation is intact.     Motor: No weakness, tremor or seizure activity.     Comments:  Patient is oriented to person and place  Psychiatric:        Behavior: Behavior is cooperative.    ED Results / Procedures / Treatments   Labs (all labs ordered are listed, but only abnormal results are displayed) Labs Reviewed - No data to display  EKG None  Radiology DG Ribs Unilateral W/Chest Right  Result Date: 02/26/2021 CLINICAL DATA:  Pain after fall EXAM: RIGHT RIBS AND CHEST - 3+ VIEW COMPARISON:  Radiograph 04/28/2018, chest CT 09/23/2020, CT 02/10/2020 FINDINGS: There are multiple old rib fractures involving the right anterolateral seventh and eighth ribs. There is an acute or subacute fracture involving the anterolateral tenth rib. Unchanged cardiomediastinal silhouette. There is no new focal airspace disease. There is no large pleural effusion or visible pneumothorax. Bilateral shoulder degenerative changes. Thoracic spondylosis. IMPRESSION: Acute or subacute minimally displaced fracture involving the right anterolateral tenth rib. Old anterolateral seventh and eighth right rib injuries. No acute cardiopulmonary disease. Electronically Signed   By: Maurine Simmering M.D.   On: 02/26/2021 17:50   CT HEAD WO CONTRAST (5MM)  Result Date: 02/26/2021 CLINICAL DATA:  Head trauma, fall EXAM: CT HEAD WITHOUT CONTRAST TECHNIQUE: Contiguous axial images were obtained from the base of the skull through the vertex without intravenous contrast. COMPARISON:  09/22/2020 FINDINGS: Brain: There is atrophy and chronic small vessel disease changes. No acute intracranial abnormality. Specifically, no hemorrhage, hydrocephalus, mass lesion, acute infarction, or significant intracranial injury. Vascular: No hyperdense vessel or unexpected calcification. Skull: No acute calvarial abnormality. Sinuses/Orbits: No acute findings Other: None IMPRESSION: Atrophy, chronic microvascular disease. No acute intracranial abnormality. Electronically Signed   By: Rolm Baptise M.D.   On: 02/26/2021 18:15     Procedures Procedures   Medications Ordered in ED Medications  lidocaine (LIDODERM) 5 % 1 patch (has no administration in time range)  Tdap (BOOSTRIX) injection 0.5 mL (0.5 mLs Intramuscular Given 02/26/21 1907)  oxyCODONE-acetaminophen (PERCOCET/ROXICET) 5-325 MG per tablet 1 tablet (1 tablet Oral Given 02/26/21 1909)    ED Course  I have reviewed the triage vital signs and the nursing notes.  Pertinent labs & imaging results that were available during my care of the patient were reviewed by me and considered in my medical decision making (see chart for details).    MDM Rules/Calculators/A&P                           Alert 75 year old female no acute distress, nontoxic-appearing.  Patient has history of dementia and is alert to person and place at baseline.  Patient is on O2 via nasal cannula due to COPD at baseline.  Brought to ED by her son who reports patient suffered a fall yesterday evening.  Fall was unwitnessed however patient was found to be alert moments after falling.  Patient denies hitting her head or any loss of consciousness.  Patient is not on any blood thinners.  Complains of pain to right ribs, tenderness on exam.  No deformity or crepitus.  Will obtain x-ray imaging to evaluate for acute fracture.  Fall advanced age will obtain noncontrast head CT to evaluate for intracranial hemorrhage.  X-ray imaging of left hip shows displaced fracture to 10th rib.  Will order patient incentive spirometer and instructions on how to use.  Patient to use OTC medication for pain  management.  No opiate medication prescribed due to patient's advanced age and fall risk.  Noncontrasted CT of head is unremarkable.  Due to patient's skin tears we will update Tdap.  Skin tears were approximated and bandaged.  Patient to follow-up with primary care provider as needed.  Discussed results, findings, treatment and follow up. Patient advised of return precautions. Patient verbalized  understanding and agreed with plan.   Patient care and treatment were discussed with attending physician Dr.Steinl.    Final Clinical Impression(s) / ED Diagnoses Final diagnoses:  Fall, initial encounter  Closed fracture of one rib of right side, initial encounter  Skin tear of forearm without complication, initial encounter    Rx / DC Orders ED Discharge Orders          Ordered    Lidocaine (HM LIDOCAINE PATCH) 4 % PTCH  Every 12 hours PRN        02/26/21 1943             Loni Beckwith, PA-C 02/26/21 1948    Loni Beckwith, PA-C 02/26/21 2112    Lajean Saver, MD 03/01/21 [phone]

## 2021-02-26 NOTE — ED Triage Notes (Signed)
Pt presents s/p unwitnessed fall onto hardwood floor last night. Pt landed on her right side.  Pt has skin tears to right arm and is c/o pain to right rib cage.

## 2021-02-26 NOTE — Discharge Instructions (Addendum)
[facility] to be evaluated for your injuries after suffering a fall.  The x-ray of your ribs showed a fracture to the 10th ribs.  Please use Tylenol to help with pain management.  Use incentive spirometer to help prevent lung collapse.  Get help right away if: You have difficulty breathing or you are short of breath. You develop a cough that does not stop, or you cough up thick or bloody sputum. You have nausea, vomiting, or pain in your abdomen. Your pain gets worse and medicine does not help.

## 2021-02-26 NOTE — ED Notes (Signed)
Skin tears to R arm cleaned and steri strips placed.  Patient and family educated on care

## 2021-02-28 ENCOUNTER — Encounter: Payer: Medicare HMO | Admitting: Gastroenterology

## 2021-03-08 ENCOUNTER — Encounter: Payer: Self-pay | Admitting: Internal Medicine

## 2021-03-08 ENCOUNTER — Ambulatory Visit (INDEPENDENT_AMBULATORY_CARE_PROVIDER_SITE_OTHER): Payer: Medicare HMO | Admitting: Internal Medicine

## 2021-03-08 ENCOUNTER — Other Ambulatory Visit: Payer: Self-pay

## 2021-03-08 VITALS — BP 118/70 | HR 96 | Ht 64.0 in | Wt 90.8 lb

## 2021-03-08 DIAGNOSIS — I10 Essential (primary) hypertension: Secondary | ICD-10-CM | POA: Diagnosis not present

## 2021-03-08 DIAGNOSIS — R634 Abnormal weight loss: Secondary | ICD-10-CM | POA: Diagnosis not present

## 2021-03-08 DIAGNOSIS — R079 Chest pain, unspecified: Secondary | ICD-10-CM | POA: Diagnosis not present

## 2021-03-08 DIAGNOSIS — R739 Hyperglycemia, unspecified: Secondary | ICD-10-CM | POA: Diagnosis not present

## 2021-03-08 DIAGNOSIS — E559 Vitamin D deficiency, unspecified: Secondary | ICD-10-CM | POA: Diagnosis not present

## 2021-03-08 DIAGNOSIS — E7800 Pure hypercholesterolemia, unspecified: Secondary | ICD-10-CM | POA: Diagnosis not present

## 2021-03-08 MED ORDER — TRAMADOL HCL 50 MG PO TABS: 50.0000 mg | ORAL_TABLET | Freq: Four times a day (QID) | ORAL | 0 refills | Status: DC | PRN

## 2021-03-08 MED ORDER — MEGESTROL ACETATE 40 MG PO TABS: 40.0000 mg | ORAL_TABLET | Freq: Every day | ORAL | 3 refills | Status: DC

## 2021-03-08 NOTE — Patient Instructions (Addendum)
Ok to not take the Albuterol Inhaler if you are taking the Xopenex inhaler  Please take all new medication as prescribed - the tramadol for pain, and the megace for appetite  Please continue all other medications as before, and refills have been done if requested.  Please have the pharmacy call with any other refills you may need.  Please keep your appointments with your specialists as you may have planned - Dr Fuller Plan on Sept 27  Please make an Appointment to return in 4 months, or sooner if needed

## 2021-03-08 NOTE — Progress Notes (Signed)
Patient ID: Andrea Larson, female   DOB: [birth year], 75 y.o.   MRN: [id number]        Chief Complaint: follow up HTN, HLD and hyperglycemia, low vit d, recent fall, rib pain, skin tear, and low appetite       HPI:  Andrea Larson is a 75 y.o. female here with hx of dementia and frailty with recent unwitnessed fall, c/o right chest/rib pain, unfortunately found in ED to have new right rib fracture, minimally displaced.  Today pt c/o persistent uncontrolled pain as ibuprofen, tylenol not working well.  Also had large skin tearing to the right arm above and below the elbow where skin was replaced and appaers to have adhered well, without worsening redness, swelling or fever chills.  Family with her mentions low appetite, asks for tx.  Having Pt at home now to improve strength, balance and mobility.  No further falls since this episode  Pt denies wheezing, orthopnea, PND, increased LE swelling, palpitations, dizziness or syncope, though does have pleuritic pain on right with sob.         Wt Readings from Last 3 Encounters:  03/08/21 90 lb 12.8 oz (41.2 kg)  02/26/21 93 lb (42.2 kg)  12/16/20 92 lb (41.7 kg)   BP Readings from Last 3 Encounters:  03/08/21 118/70  02/26/21 116/82  12/16/20 140/78         Past Medical History:  Diagnosis Date   Allergic rhinitis    Allergic rhinitis, cause unspecified 09/21/2011   Aortic stenosis 08/23/2016   Arthritis    Asthma    Asthma 09/21/2011   Colon polyps    COPD (chronic obstructive pulmonary disease) (HCC)    mild    Coronary artery calcification 08/23/2016   Dementia (HCC)    Depression with anxiety    HTN (hypertension)    Hyperlipidemia    UTI (lower urinary tract infection)    Past Surgical History:  Procedure Laterality Date   DENTAL SURGERY     pilonidial cyst     TONSILLECTOMY      reports that she quit smoking about 5 years ago. Her smoking use included cigarettes. She has a 41.25 pack-year smoking history. She has never used  smokeless tobacco. She reports current alcohol use. She reports that she does not use drugs. family history includes Alcohol abuse in an other family member; Arthritis in an other family member; Atrial fibrillation in her brother; Heart disease in an other family member; Hypertension in her father and another family member; Other in her mother; Stroke in her father and another family member. Allergies  Allergen Reactions   Lipitor [Atorvastatin] Other (See Comments)    Myalgias   Current Outpatient Medications on File Prior to Visit  Medication Sig Dispense Refill   acetaminophen (TYLENOL) 325 MG tablet Take 650 mg by mouth every 6 (six) hours as needed for mild pain or headache.     amLODipine (NORVASC) 5 MG tablet Take 1 tablet (5 mg total) by mouth daily. 90 tablet 3   buPROPion (WELLBUTRIN XL) 150 MG 24 hr tablet Take 1 tablet (150 mg total) by mouth daily. 90 tablet 3   Cholecalciferol (THERA-D 2000) 50 MCG (2000 UT) TABS 1 tab by mouth once daily 30 tablet 99   clonazePAM (KLONOPIN) 0.5 MG tablet Take 1 tablet (0.5 mg total) by mouth 2 (two) times daily as needed for anxiety. 60 tablet 2   donepezil (ARICEPT) 10 MG tablet Take 1 tablet (10 mg total)  by mouth at bedtime. 90 tablet 3   feeding supplement, ENSURE ENLIVE, (ENSURE ENLIVE) LIQD Take 237 mLs by mouth 3 (three) times daily between meals. 237 mL    FLUoxetine (PROZAC) 40 MG capsule TAKE (1) CAPSULE DAILY. 90 capsule 3   levalbuterol (XOPENEX HFA) 45 MCG/ACT inhaler USE 2 PUFFS EVERY 6 HOURS AS NEEDED FOR WHEEZING. **SHAKE WELL** 15 g 5   levalbuterol (XOPENEX) 0.63 MG/3ML nebulizer solution USE 1 VIAL VIA NEBULIZER EVERY 6 HOURS AS NEEDED FOR WHEEZING OR SHORTNESS OF BREATH. 72 mL 5   Lidocaine (HM LIDOCAINE PATCH) 4 % PTCH Apply 1 patch topically every 12 (twelve) hours as needed. 30 patch 0   memantine (NAMENDA) 10 MG tablet Take 1 tablet (10 mg total) by mouth 2 (two) times daily. 180 tablet 3   montelukast (SINGULAIR) 10 MG  tablet Take 1 tablet (10 mg total) by mouth daily. 90 tablet 3   Respiratory Therapy Supplies (FLUTTER) DEVI Use as directed 1 each 0   rosuvastatin (CRESTOR) 20 MG tablet Take 1 tablet (20 mg total) by mouth daily. 90 tablet 3   SPIRIVA RESPIMAT 2.5 MCG/ACT AERS USE 1 PUFF DAILY AS DIRECTED 4 g 3   TRELEGY ELLIPTA 200-62.5-25 MCG/INH AEPB INHALE 1 PUFF INTO THE LUNGS ONCE DAILY AS DIRECTED. 60 each 5   No current facility-administered medications on file prior to visit.        ROS:  All others reviewed and negative.  Objective        PE:  BP 118/70 (BP Location: Left Arm, Patient Position: Sitting, Cuff Size: Normal)   Pulse 96   Ht '5\' 4"'$  (1.626 m)   Wt 90 lb 12.8 oz (41.2 kg)   SpO2 93%   BMI 15.59 kg/m                 Constitutional: Pt appears in NAD               HENT: Head: NCAT.                Right Ear: External ear normal.                 Left Ear: External ear normal.                Eyes: . Pupils are equal, round, and reactive to light. Conjunctivae and EOM are normal               Nose: without d/c or deformity               Neck: Neck supple. Gross normal ROM               Cardiovascular: Normal rate and regular rhythm.                 Pulmonary/Chest: Effort normal and breath sounds without rales or wheezing. ; tender area right 10th anterolat rib               Abd:  Soft, NT, ND, + BS, no organomegaly               Neurological: Pt is alert. At baseline orientation, motor grossly intact               Skin: Skin is warm. No rashes, no other new lesions, LE edema - none               Psychiatric: Pt behavior is normal without agitation  Micro: none  Cardiac tracings I have personally interpreted today:  none  Pertinent Radiological findings (summarize): none   Lab Results  Component Value Date   WBC 6.3 10/12/2020   HGB 13.0 10/12/2020   HCT 39.2 10/12/2020   PLT 415.0 (H) 10/12/2020   GLUCOSE 88 10/12/2020   CHOL 137 10/12/2020   TRIG 128.0 10/12/2020    HDL 52.30 10/12/2020   LDLDIRECT 124.8 09/21/2011   LDLCALC 59 10/12/2020   ALT 24 10/12/2020   AST 29 10/12/2020   NA 136 10/12/2020   K 4.6 10/12/2020   CL 102 10/12/2020   CREATININE 1.03 10/12/2020   BUN 19 10/12/2020   CO2 28 10/12/2020   TSH 1.03 10/12/2020   HGBA1C 4.9 04/05/2020   Assessment/Plan:  PRESSLEY VALENZANO is a 75 y.o. White or Caucasian [1] female with  has a past medical history of Allergic rhinitis, Allergic rhinitis, cause unspecified (09/21/2011), Aortic stenosis (08/23/2016), Arthritis, Asthma, Asthma (09/21/2011), Colon polyps, COPD (chronic obstructive pulmonary disease) ([place]), Coronary artery calcification (08/23/2016), Dementia ([place]), Depression with anxiety, HTN (hypertension), Hyperlipidemia, and UTI (lower urinary tract infection).  Vitamin D deficiency Last vitamin D Lab Results  Component Value Date   VD25OH 22.16 (L) 10/12/2020   Low, to start oral replacement   Hyperlipidemia Lab Results  Component Value Date   LDLCALC 59 10/12/2020   Stable, pt to continue current statin crestor 20   Hyperglycemia Lab Results  Component Value Date   HGBA1C 4.9 04/05/2020   Stable, pt to continue current medical treatment  - diet   Essential hypertension BP Readings from Last 3 Encounters:  03/08/21 118/70  02/26/21 116/82  12/16/20 140/78   Low normal, pt to continue medical treatment - norvasc 5   Weight loss With recent worsening low appetite - for megace 40 qd  Chest pain Due to rib pain - for tramadol prn  Followup: Return in about 4 months (around 07/08/2021).  Cathlean Cower, MD 03/12/2021 1:31 PM [facility]

## 2021-03-12 ENCOUNTER — Encounter: Payer: Self-pay | Admitting: Internal Medicine

## 2021-03-12 NOTE — Assessment & Plan Note (Signed)
Last vitamin D Lab Results  Component Value Date   VD25OH 22.16 (L) 10/12/2020   Low, to start oral replacement

## 2021-03-12 NOTE — Assessment & Plan Note (Signed)
Lab Results  Component Value Date   HGBA1C 4.9 04/05/2020   Stable, pt to continue current medical treatment  - diet

## 2021-03-12 NOTE — Assessment & Plan Note (Signed)
Due to rib pain - for tramadol prn

## 2021-03-12 NOTE — Assessment & Plan Note (Signed)
Lab Results  Component Value Date   LDLCALC 59 10/12/2020   Stable, pt to continue current statin crestor 20

## 2021-03-12 NOTE — Assessment & Plan Note (Signed)
BP Readings from Last 3 Encounters:  03/08/21 118/70  02/26/21 116/82  12/16/20 140/78   Low normal, pt to continue medical treatment - norvasc 5

## 2021-03-12 NOTE — Assessment & Plan Note (Signed)
With recent worsening low appetite - for megace 40 qd

## 2021-03-19 ENCOUNTER — Encounter: Payer: Self-pay | Admitting: Internal Medicine

## 2021-03-19 ENCOUNTER — Other Ambulatory Visit: Payer: Self-pay | Admitting: Pulmonary Disease

## 2021-03-19 DIAGNOSIS — R300 Dysuria: Secondary | ICD-10-CM

## 2021-03-19 DIAGNOSIS — J449 Chronic obstructive pulmonary disease, unspecified: Secondary | ICD-10-CM

## 2021-03-20 ENCOUNTER — Other Ambulatory Visit (INDEPENDENT_AMBULATORY_CARE_PROVIDER_SITE_OTHER): Payer: Medicare HMO

## 2021-03-20 DIAGNOSIS — R300 Dysuria: Secondary | ICD-10-CM

## 2021-03-20 NOTE — Telephone Encounter (Signed)
Please advise as the pts son has stated "Mom has been a little confused the past couple of days and we figured that she might have another UTI. We bought an OTC urinary test from [facility] and it tested positive for Leukocytes and negative for Nitrite. Can you prescribe an antibiotic for her?"  **Pt son has stated it is very hard to get pt to leave her as she is against leaving. Pt son states they can do a urine collection at home and drop it to lab if needed.  **I was able to label a urine cup for pts son to come pick it up today and drop it off to lab by either 4:30 today or in the morning.

## 2021-03-20 NOTE — Telephone Encounter (Signed)
Spoke with patient's son and he has submitted urine specimen.

## 2021-03-21 ENCOUNTER — Encounter: Payer: Self-pay | Admitting: Internal Medicine

## 2021-03-21 ENCOUNTER — Telehealth: Payer: Self-pay

## 2021-03-21 LAB — URINALYSIS, ROUTINE W REFLEX MICROSCOPIC
Bilirubin Urine: NEGATIVE
Hgb urine dipstick: NEGATIVE
Ketones, ur: NEGATIVE
Leukocytes,Ua: NEGATIVE
Nitrite: NEGATIVE
Specific Gravity, Urine: 1.015 (ref 1.000–1.030)
Total Protein, Urine: NEGATIVE
Urine Glucose: 100 — AB
Urobilinogen, UA: 0.2 (ref 0.0–1.0)
pH: 6 (ref 5.0–8.0)

## 2021-03-21 LAB — URINE CULTURE

## 2021-03-21 NOTE — Telephone Encounter (Signed)
Please advise as pts son called asking about pts urine labs and if there is any medications needed to be picked up?

## 2021-03-21 NOTE — Telephone Encounter (Signed)
The initial urinalysis is not consistent with definite uti, so will need to check on the urine culture when back in 1-2 days

## 2021-03-24 ENCOUNTER — Telehealth: Payer: Self-pay | Admitting: Pulmonary Disease

## 2021-03-24 NOTE — Telephone Encounter (Signed)
PA request was received from (pharmacy): [facility] Phone: Fax:  Medication name and strength: Trelegy 242mg Ordering Provider: Dr. MVaughan Browner [facility] Yes If yes, please enter KEY: [id number] Medication tried and failed: Spiriva 2.543m, Symbicort 16033m Pulmicort 0.'25mg'$ , Spiriva 24m28mnd Dulera 200mc29movered Alternatives:   PA sent to plan, time frame for approval / denial: 3-7 days Routing to Lisa Shafteror follow-up.

## 2021-03-27 NOTE — Telephone Encounter (Signed)
PA Case: [id number], Status: Approved, Coverage Starts on: 07/09/2020 12:00:00 AM, Coverage Ends on: 07/08/2021 12:00:00 AM. Questions? Contact [phone].  PA for Trelegy has been approved. Pharmacy is aware of approval.   Nothing further needed.

## 2021-04-03 ENCOUNTER — Ambulatory Visit (INDEPENDENT_AMBULATORY_CARE_PROVIDER_SITE_OTHER): Payer: Medicare HMO | Admitting: Family

## 2021-04-03 ENCOUNTER — Other Ambulatory Visit: Payer: Self-pay

## 2021-04-03 ENCOUNTER — Encounter: Payer: Self-pay | Admitting: Family

## 2021-04-03 VITALS — BP 100/70 | HR 75 | Temp 97.1°F | Resp 16 | Ht 64.0 in | Wt 87.8 lb

## 2021-04-03 DIAGNOSIS — E440 Moderate protein-calorie malnutrition: Secondary | ICD-10-CM

## 2021-04-03 DIAGNOSIS — R634 Abnormal weight loss: Secondary | ICD-10-CM

## 2021-04-03 DIAGNOSIS — I700 Atherosclerosis of aorta: Secondary | ICD-10-CM

## 2021-04-03 DIAGNOSIS — F5101 Primary insomnia: Secondary | ICD-10-CM

## 2021-04-03 DIAGNOSIS — F418 Other specified anxiety disorders: Secondary | ICD-10-CM

## 2021-04-03 DIAGNOSIS — I10 Essential (primary) hypertension: Secondary | ICD-10-CM

## 2021-04-03 DIAGNOSIS — Z7689 Persons encountering health services in other specified circumstances: Secondary | ICD-10-CM

## 2021-04-03 DIAGNOSIS — E7800 Pure hypercholesterolemia, unspecified: Secondary | ICD-10-CM

## 2021-04-03 DIAGNOSIS — F0390 Unspecified dementia without behavioral disturbance: Secondary | ICD-10-CM

## 2021-04-03 DIAGNOSIS — E559 Vitamin D deficiency, unspecified: Secondary | ICD-10-CM

## 2021-04-03 DIAGNOSIS — R399 Unspecified symptoms and signs involving the genitourinary system: Secondary | ICD-10-CM

## 2021-04-03 DIAGNOSIS — J9611 Chronic respiratory failure with hypoxia: Secondary | ICD-10-CM | POA: Diagnosis not present

## 2021-04-03 LAB — POCT URINALYSIS DIPSTICK
Bilirubin, UA: NEGATIVE
Blood, UA: NEGATIVE
Glucose, UA: NEGATIVE
Ketones, UA: POSITIVE
Nitrite, UA: POSITIVE
Protein, UA: POSITIVE — AB
Spec Grav, UA: >=1.03 — AB (ref 1.010–1.025)
Urobilinogen, UA: NEGATIVE E.U./dL — AB
pH, UA: 5 (ref 5.0–8.0)

## 2021-04-03 MED ORDER — DONEPEZIL HCL 5 MG PO TABS: 5.0000 mg | ORAL_TABLET | Freq: Every day | ORAL | 0 refills | Status: DC

## 2021-04-03 MED ORDER — MIRTAZAPINE 7.5 MG PO TABS: 7.5000 mg | ORAL_TABLET | Freq: Every day | ORAL | 0 refills | Status: DC

## 2021-04-03 NOTE — Progress Notes (Signed)
Provider: Marlowe Sax FNP-C   Biagio Borg, MD  Patient Care Team: Biagio Borg, MD as PCP - General (Internal Medicine) Skeet Latch, MD as PCP - Cardiology (Cardiology)  Extended Emergency Contact Information Primary Emergency Contact: [facility] Address: [address]          [place], [place] [address] Johnnette Litter of [place] Work Phone: [phone] Mobile Phone: [phone] Relation: Son Secondary Emergency Contact: Thedore Mins [place] of [place] Phone: [phone] Relation: Brother  Code Status:  Full code  Goals of care: Advanced Directive information Advanced Directives 04/03/2021  Does Patient Have a Medical Advance Directive? Yes  Type of Advance Directive Living will  Does patient want to make changes to medical advance directive? No - Patient declined  Copy of [place] in Chart? -  Would patient like information on creating a medical advance directive? No - Patient declined     Chief Complaint  Patient presents with   Establish Care    New Patient.    Concern     High Fall Risk    HPI:  Pt is a 75 y.o. female seen today to establish care for medical management of chronic diseases. She is here with son and Personal care assistant.Has a medical history of Hypertension,Pure Hypercholesterolemia,Depression with anxiety,Aortic Atherosclerosis per CT scan done 09/23/2020, Dementia without behavioral disturbance,chronic Hypoxemic Respiratory Failure,Emphysema,vitamin D deficiency,Insomnia,Protein Malnutrition,weight loss ,several bilateral old rib fracture, Allergic Rhinitis,Gait abnormality,Hx of fall among others.  Hypertension - No home readings for review.B/p within normal range today.currently on Amlodipine 5 mg tablet daily.denies any headache,dizziness,vision changes,fatigue,chest tightness,palpitation or chest pain Does have chronic shortness of breath from Emphysema.   Depression - son states  stable since he moved her out of the house she was living with other family members had toxic relationship that always stressed her but now doing much better.On Bupropion  150 mg and Fluoxetine 40 mg capsule.  Generalized Anxiety disorder - has improved since moving out of previous house as above.continue to require clonazepam 0.5 mg twice daily as PRN.son states advised care givers to just give if really needed.states used to be on Xanax in the past looks like she took more often again possible due to past living situation with others stressing her.   Emphysema/chronic hypoxemic respiratory failure - Has chronic shortness of breath.Wears continuous oxygen 2.5 liters via nasal canula.request Portable oxygen tank to use while out of the house.Has Levalbuterol nebulizer solution but son states has not required Neb solution in a very long time but does use her levalbuterol Inhaler,Spiriva Respimat and Trelegy Ellipta daily.Has not been seen by pulmonologist in awhile. Oxygen saturation at rest 91 % on room air but drops to 89 % on ambulation.states no worsening cough but does cough up mucus.on Montelukast.Denies any fever or chills. Chest CT scan done 09/23/2020 showed few scattered pulmonary micro-nodules.  Insomnia - on melatonin 5 mg tablet states ineffective.   Weight loss - states has no appetite.son and care giver states sometimes will eat meals well and other times does not eat at all even food that she likes to eat. Care giver tries to make shakes using Ensure. States weight stable today.   Dementia - on Aricept 10 mg tablet and Namenda 10 mg twice daily.No behavioral issues reported.except son states had an episode when she stated saw people that were not there and talked to them.she was frustrated that she knew there were not there.Son thought this could have been UTI symptoms  so he checked urine for UTI and was positive for Nitrites.He took her for evaluation with her previous PCP Dr.John Jeneen Rinks and  urine specimen was send for culture but did not show any infection.He states thinks she still has symptoms of UTI.   On chart review,MRI of the brain without contrast done 06/04/2020 showed Age- related cerebral atrophy with mild chronic small vessel ischemic disease.   Chronic right shoulder pain -  per care giver rarely takes Tramadol for pain but will take Tylenol every now and then.   Abnormal gait - hx of falls walks with walker with assist. No recent fall episode.    Past Medical History:  Diagnosis Date   Allergic rhinitis    Allergic rhinitis, cause unspecified 09/21/2011   Aortic stenosis 08/23/2016   Arthritis    Asthma    Asthma 09/21/2011   Colon polyps    COPD (chronic obstructive pulmonary disease) (HCC)    mild    Coronary artery calcification 08/23/2016   Dementia (HCC)    Depression with anxiety    HTN (hypertension)    Hyperlipidemia    UTI (lower urinary tract infection)    Past Surgical History:  Procedure Laterality Date   DENTAL SURGERY     pilonidial cyst     TONSILLECTOMY      Allergies  Allergen Reactions   Lipitor [Atorvastatin] Other (See Comments)    Myalgias    Allergies as of 04/03/2021       Reactions   Lipitor [atorvastatin] Other (See Comments)   Myalgias        Medication List        Accurate as of April 03, 2021  2:03 PM. If you have any questions, ask your nurse or doctor.          acetaminophen 325 MG tablet Commonly known as: TYLENOL Take 650 mg by mouth every 6 (six) hours as needed for mild pain or headache.   amLODipine 5 MG tablet Commonly known as: NORVASC Take 1 tablet (5 mg total) by mouth daily.   buPROPion 150 MG 24 hr tablet Commonly known as: WELLBUTRIN XL Take 1 tablet (150 mg total) by mouth daily.   clonazePAM 0.5 MG tablet Commonly known as: KLONOPIN Take 1 tablet (0.5 mg total) by mouth 2 (two) times daily as needed for anxiety.   donepezil 10 MG tablet Commonly known as: Aricept Take 1  tablet (10 mg total) by mouth at bedtime.   feeding supplement Liqd Take 237 mLs by mouth 3 (three) times daily between meals.   FLUoxetine 40 MG capsule Commonly known as: PROZAC TAKE (1) CAPSULE DAILY.   Flutter Devi Use as directed   levalbuterol 0.63 MG/3ML nebulizer solution Commonly known as: XOPENEX USE 1 VIAL VIA NEBULIZER EVERY 6 HOURS AS NEEDED FOR WHEEZING OR SHORTNESS OF BREATH.   levalbuterol 45 MCG/ACT inhaler Commonly known as: XOPENEX HFA USE 2 PUFFS EVERY 6 HOURS AS NEEDED FOR WHEEZING. **SHAKE WELL**   Lidocaine 4 % Ptch Commonly known as: HM Lidocaine Patch Apply 1 patch topically every 12 (twelve) hours as needed.   megestrol 40 MG tablet Commonly known as: MEGACE Take 1 tablet (40 mg total) by mouth daily.   melatonin 5 MG Tabs Take 5 mg by mouth at bedtime.   memantine 10 MG tablet Commonly known as: NAMENDA Take 1 tablet (10 mg total) by mouth 2 (two) times daily.   montelukast 10 MG tablet Commonly known as: SINGULAIR Take 1 tablet (10 mg  total) by mouth daily.   rosuvastatin 20 MG tablet Commonly known as: CRESTOR Take 1 tablet (20 mg total) by mouth daily.   Spiriva Respimat 2.5 MCG/ACT Aers Generic drug: Tiotropium Bromide Monohydrate USE 1 PUFF DAILY AS DIRECTED   Thera-D 2000 50 MCG (2000 UT) Tabs Generic drug: Cholecalciferol 1 tab by mouth once daily   traMADol 50 MG tablet Commonly known as: ULTRAM Take 1 tablet (50 mg total) by mouth every 6 (six) hours as needed.   Trelegy Ellipta 200-62.5-25 MCG/INH Aepb Generic drug: Fluticasone-Umeclidin-Vilant INHALE 1 PUFF INTO THE LUNGS ONCE DAILY AS DIRECTED.        Review of Systems  Constitutional:  Negative for appetite change, chills, fatigue, fever and unexpected weight change.  HENT:  Positive for hearing loss. Negative for congestion, dental problem, ear discharge, ear pain, facial swelling, nosebleeds, postnasal drip, rhinorrhea, sinus pressure, sinus pain, sneezing,  sore throat, tinnitus and trouble swallowing.        Has hearing aids but does not wear   Eyes:  Negative for pain, discharge, redness and itching.       Had laser surgery  Respiratory:  Negative for cough, chest tightness, shortness of breath and wheezing.   Cardiovascular:  Negative for chest pain, palpitations and leg swelling.  Gastrointestinal:  Negative for abdominal distention, abdominal pain, blood in stool, constipation, diarrhea, nausea and vomiting.  Endocrine: Negative for cold intolerance, heat intolerance, polydipsia, polyphagia and polyuria.  Genitourinary:  Negative for difficulty urinating, dysuria, flank pain, frequency and urgency.  Musculoskeletal:  Positive for arthralgias and gait problem. Negative for back pain, joint swelling, myalgias, neck pain and neck stiffness.       Right shoulder   Skin:  Negative for color change, pallor, rash and wound.  Neurological:  Negative for dizziness, syncope, speech difficulty, weakness, light-headedness, numbness and headaches.  Hematological:  Does not bruise/bleed easily.  Psychiatric/Behavioral:  Positive for sleep disturbance. Negative for agitation, behavioral problems, confusion, hallucinations, self-injury and suicidal ideas. The patient is not nervous/anxious.    Immunization History  Administered Date(s) Administered   Fluad Quad(high Dose 65+) 04/15/2019, 04/05/2020   Influenza Split 04/11/2011   Influenza Whole 03/31/2008   Influenza, High Dose Seasonal PF 04/29/2013, 03/18/2018   Influenza,inj,Quad PF,6+ Mos 05/07/2014   Influenza-Unspecified 05/13/2015, 04/19/2017   PFIZER(Purple Top)SARS-COV-2 Vaccination 08/01/2019, 08/23/2019, 04/25/2020   Pneumococcal Conjugate-13 05/13/2013   Pneumococcal Polysaccharide-23 03/31/2008, 08/19/2017   Td 02/10/2008   Tdap 12/18/2015, 09/22/2020, 02/26/2021   Zoster Recombinat (Shingrix) 01/25/2017   Zoster, Live 07/09/2005, 01/24/2006   Pertinent  Health Maintenance Due  Topic  Date Due   COLONOSCOPY (Pts 45-62yr Insurance coverage will need to be confirmed)  04/19/2020   INFLUENZA VACCINE  02/06/2021   MAMMOGRAM  05/19/2021   DEXA SCAN  Completed   Fall Risk  04/03/2021 10/12/2020 10/12/2020 04/05/2020 04/05/2020  Falls in the past year? _0 0 0  Number falls in past yr: 1 0 1 - 0  Injury with Fall? 1 0 1 - 0  Comment - - - - -  Risk for fall due to : History of fall(s) - - - Impaired balance/gait  Follow up Falls evaluation completed - - - Falls evaluation completed   Functional Status Survey:    Vitals:   04/03/21 1331  BP: 100/70  Pulse: 75  Resp: 16  Temp: (!) 97.1 F (36.2 C)  SpO2: 91%  Weight: 87 lb 12.8 oz (39.8 kg)  Height: _1  (1.626 m)  Body mass index is 15.07 kg/m. Physical Exam Vitals reviewed.  Constitutional:      General: She is not in acute distress.    Appearance: Normal appearance. She is underweight. She is not ill-appearing or diaphoretic.  HENT:     Head: Normocephalic.     Right Ear: Tympanic membrane, ear canal and external ear normal. There is no impacted cerumen.     Left Ear: Tympanic membrane, ear canal and external ear normal. There is no impacted cerumen.     Nose: Nose normal. No congestion or rhinorrhea.     Mouth/Throat:     Mouth: Mucous membranes are moist.     Pharynx: Oropharynx is clear. No oropharyngeal exudate or posterior oropharyngeal erythema.  Eyes:     General: No scleral icterus.       Right eye: No discharge.        Left eye: No discharge.     Extraocular Movements: Extraocular movements intact.     Conjunctiva/sclera: Conjunctivae normal.     Pupils: Pupils are equal, round, and reactive to light.  Neck:     Vascular: No carotid bruit.  Cardiovascular:     Rate and Rhythm: Normal rate and regular rhythm.     Pulses: Normal pulses.     Heart sounds: Normal heart sounds. No murmur heard.   No friction rub. No gallop.  Pulmonary:     Effort: Pulmonary effort is normal. No respiratory  distress.     Breath sounds: Normal breath sounds. No wheezing, rhonchi or rales.  Chest:     Chest wall: No tenderness.  Abdominal:     General: Bowel sounds are normal. There is no distension.     Palpations: Abdomen is soft. There is no mass.     Tenderness: There is no abdominal tenderness. There is no right CVA tenderness, left CVA tenderness, guarding or rebound.  Musculoskeletal:        General: No swelling or tenderness.     Right shoulder: No effusion, tenderness or crepitus. Decreased range of motion. Normal strength. Normal pulse.     Left shoulder: Normal.     Cervical back: Normal range of motion. No rigidity or tenderness.     Right lower leg: No edema.     Left lower leg: No edema.  Lymphadenopathy:     Cervical: No cervical adenopathy.  Skin:    General: Skin is warm and dry.     Coloration: Skin is not pale.     Findings: No bruising, erythema, lesion or rash.  Neurological:     Mental Status: She is alert and oriented to person, place, and time.     Cranial Nerves: No cranial nerve deficit.     Sensory: No sensory deficit.     Motor: No weakness.     Coordination: Coordination normal.     Gait: Gait abnormal.  Psychiatric:        Mood and Affect: Mood normal.        Speech: Speech normal.        Behavior: Behavior normal.        Thought Content: Thought content normal.        Judgment: Judgment normal.     Comments: Scored 21/30 on MMSE today     Labs reviewed: Recent Labs    05/02/20 1641 06/03/20 2011 10/12/20 1617  NA 129* 138 136  K 3.5 3.5 4.6  CL 98 103 102  CO2 21* 25 28  GLUCOSE 103* 103*  88  BUN _0 CREATININE 1.25* 0.91 1.03  CALCIUM 8.5* 9.2 8.9   Recent Labs    05/02/20 1641 06/03/20 2011 10/12/20 1617  AST _1 ALT _2 ALKPHOS 50 57 102  BILITOT 0.8 0.7 0.2  PROT 7.0 6.9 6.9  ALBUMIN 3.2* 2.8* 3.7   Recent Labs    04/05/20 1629 05/02/20 1641 06/03/20 2011 10/12/20 1617  WBC 6.0 12.1* 12.2* 6.3   NEUTROABS 3,756  --  9.5* 4.0  HGB 14.3 13.6 12.9 13.0  HCT 43.0 40.5 40.7 39.2  MCV 99.5 97.1 99.3 94.4  PLT 398 333 500* 415.0*   Lab Results  Component Value Date   TSH 1.03 10/12/2020   Lab Results  Component Value Date   HGBA1C 4.9 04/05/2020   Lab Results  Component Value Date   CHOL 137 10/12/2020   HDL 52.30 10/12/2020   LDLCALC 59 10/12/2020   LDLDIRECT 124.8 09/21/2011   TRIG 128.0 10/12/2020   CHOLHDL 3 10/12/2020    Significant Diagnostic Results in last 30 days:  No results found.  Assessment/Plan  1. Chronic hypoxemic respiratory failure (HCC) - continue on Trelegy Ellipta,levalbuterol and Spiriva  Continue on oxygen 2.5 liters via nasal canula - For home use only DME Other see comment: Portable oxygen Tank ordered oxygen 91% at rest drops to 89% with ambulation  Portable oxygen Tank order faxed by Lilli Light  to [facility] care DME company   2. Pure hypercholesterolemia Latest LDL at goal  Continue on rosuvastatin  - Lipid panel; Future  3. Depression with anxiety Mood stable Continue on Fluoxetine,Bupropion and Clonazepam  - TSH; Future  4. Vitamin D deficiency Continue on cholecalciferol   5. Weight loss No appetite sometimes.Care giver has been making sakes from Ensure protein supplement weight stable today. - continue to encourage oral intake  - continue on Ensure supplement - Discontinue Megace  - start on Remeron 7.5 mg tablet one by mouth at bedtime  6. Primary insomnia Melatonin ineffective. Start on Remeron 7.5 mg tablet at bedtime.   7. Symptoms of urinary tract infection Reports previous urine culture with previous PCP was negative  - encouraged fluid intake  - POC Urinalysis Dipstick - Urine Culture  8. Essential hypertension B/p well controlled. Continue Amlodipine  - CBC with Differential/Platelet; Future - CMP with eGFR([facility]); Future  9. Malnutrition of moderate degree - start on Remeron  - continue Ensure  -  mirtazapine (REMERON) 7.5 MG tablet; Take 1 tablet (7.5 mg total) by mouth at bedtime.  Dispense: 30 tablet; Refill: 0  10. Dementia without behavioral disturbance, unspecified dementia type [facility]) Son reports hallucination ? Possible UTI  - continue on donepezil and Memantine  - donepezil (ARICEPT) 5 MG tablet; Take 1 tablet (5 mg total) by mouth at bedtime.  Dispense: 30 tablet; Refill: 0  11. Aortic atherosclerosis (HCC) Continue on Rosuvastatin   Family/ staff Communication: Reviewed plan of care with patient ,son and Care giver verbalized understanding.  Labs/tests ordered:  - POC Urinalysis Dipstick - Urine Culture - CBC with Differential/Platelet - CMP with eGFR([facility]) - TSH - Lipid panel  Next Appointment : 1 month for weight,appetite check.one week or sooner for fasting labs.  Sandrea Hughs, NP

## 2021-04-04 ENCOUNTER — Ambulatory Visit: Payer: Medicare HMO | Admitting: Gastroenterology

## 2021-04-04 DIAGNOSIS — I700 Atherosclerosis of aorta: Secondary | ICD-10-CM | POA: Insufficient documentation

## 2021-04-04 LAB — URINE CULTURE
MICRO NUMBER:: 12422811
SPECIMEN QUALITY:: ADEQUATE

## 2021-04-07 ENCOUNTER — Emergency Department (HOSPITAL_COMMUNITY): Payer: Medicare HMO

## 2021-04-07 ENCOUNTER — Inpatient Hospital Stay (HOSPITAL_COMMUNITY)
Admission: EM | Admit: 2021-04-07 | Discharge: 2021-04-11 | DRG: 640 | Disposition: A | Discharge status: Home Health | Payer: Medicare HMO | Attending: Internal Medicine | Admitting: Internal Medicine

## 2021-04-07 ENCOUNTER — Telehealth: Payer: Self-pay

## 2021-04-07 ENCOUNTER — Ambulatory Visit: Payer: Medicare HMO | Admitting: Nurse Practitioner

## 2021-04-07 ENCOUNTER — Observation Stay (HOSPITAL_COMMUNITY): Payer: Medicare HMO

## 2021-04-07 ENCOUNTER — Encounter (HOSPITAL_COMMUNITY): Payer: Self-pay

## 2021-04-07 ENCOUNTER — Other Ambulatory Visit: Payer: Self-pay

## 2021-04-07 DIAGNOSIS — F419 Anxiety disorder, unspecified: Secondary | ICD-10-CM | POA: Diagnosis present

## 2021-04-07 DIAGNOSIS — R4182 Altered mental status, unspecified: Secondary | ICD-10-CM | POA: Diagnosis not present

## 2021-04-07 DIAGNOSIS — Z7951 Long term (current) use of inhaled steroids: Secondary | ICD-10-CM

## 2021-04-07 DIAGNOSIS — E870 Hyperosmolality and hypernatremia: Secondary | ICD-10-CM | POA: Diagnosis present

## 2021-04-07 DIAGNOSIS — R627 Adult failure to thrive: Secondary | ICD-10-CM | POA: Diagnosis present

## 2021-04-07 DIAGNOSIS — F05 Delirium due to known physiological condition: Secondary | ICD-10-CM | POA: Diagnosis present

## 2021-04-07 DIAGNOSIS — F0390 Unspecified dementia without behavioral disturbance: Secondary | ICD-10-CM | POA: Diagnosis not present

## 2021-04-07 DIAGNOSIS — R933 Abnormal findings on diagnostic imaging of other parts of digestive tract: Secondary | ICD-10-CM | POA: Diagnosis not present

## 2021-04-07 DIAGNOSIS — R1310 Dysphagia, unspecified: Secondary | ICD-10-CM

## 2021-04-07 DIAGNOSIS — Z79899 Other long term (current) drug therapy: Secondary | ICD-10-CM

## 2021-04-07 DIAGNOSIS — E162 Hypoglycemia, unspecified: Secondary | ICD-10-CM | POA: Diagnosis present

## 2021-04-07 DIAGNOSIS — E860 Dehydration: Principal | ICD-10-CM

## 2021-04-07 DIAGNOSIS — J441 Chronic obstructive pulmonary disease with (acute) exacerbation: Secondary | ICD-10-CM | POA: Diagnosis not present

## 2021-04-07 DIAGNOSIS — I10 Essential (primary) hypertension: Secondary | ICD-10-CM | POA: Diagnosis not present

## 2021-04-07 DIAGNOSIS — E785 Hyperlipidemia, unspecified: Secondary | ICD-10-CM | POA: Diagnosis present

## 2021-04-07 DIAGNOSIS — R410 Disorientation, unspecified: Secondary | ICD-10-CM | POA: Diagnosis not present

## 2021-04-07 DIAGNOSIS — Z20822 Contact with and (suspected) exposure to covid-19: Secondary | ICD-10-CM | POA: Diagnosis present

## 2021-04-07 DIAGNOSIS — F32A Depression, unspecified: Secondary | ICD-10-CM | POA: Diagnosis present

## 2021-04-07 DIAGNOSIS — Z87891 Personal history of nicotine dependence: Secondary | ICD-10-CM | POA: Diagnosis not present

## 2021-04-07 DIAGNOSIS — K222 Esophageal obstruction: Secondary | ICD-10-CM | POA: Diagnosis not present

## 2021-04-07 DIAGNOSIS — Z0389 Encounter for observation for other suspected diseases and conditions ruled out: Secondary | ICD-10-CM | POA: Diagnosis not present

## 2021-04-07 DIAGNOSIS — I2510 Atherosclerotic heart disease of native coronary artery without angina pectoris: Secondary | ICD-10-CM | POA: Diagnosis present

## 2021-04-07 DIAGNOSIS — F418 Other specified anxiety disorders: Secondary | ICD-10-CM | POA: Diagnosis not present

## 2021-04-07 DIAGNOSIS — R0602 Shortness of breath: Secondary | ICD-10-CM | POA: Diagnosis not present

## 2021-04-07 DIAGNOSIS — R451 Restlessness and agitation: Secondary | ICD-10-CM | POA: Diagnosis not present

## 2021-04-07 DIAGNOSIS — G9341 Metabolic encephalopathy: Secondary | ICD-10-CM | POA: Diagnosis not present

## 2021-04-07 DIAGNOSIS — G9340 Encephalopathy, unspecified: Secondary | ICD-10-CM | POA: Diagnosis not present

## 2021-04-07 DIAGNOSIS — Z888 Allergy status to other drugs, medicaments and biological substances status: Secondary | ICD-10-CM

## 2021-04-07 DIAGNOSIS — J449 Chronic obstructive pulmonary disease, unspecified: Secondary | ICD-10-CM

## 2021-04-07 DIAGNOSIS — F03918 Unspecified dementia, unspecified severity, with other behavioral disturbance: Secondary | ICD-10-CM | POA: Diagnosis present

## 2021-04-07 DIAGNOSIS — Z8249 Family history of ischemic heart disease and other diseases of the circulatory system: Secondary | ICD-10-CM

## 2021-04-07 DIAGNOSIS — N390 Urinary tract infection, site not specified: Secondary | ICD-10-CM | POA: Diagnosis present

## 2021-04-07 DIAGNOSIS — K2100 Gastro-esophageal reflux disease with esophagitis, without bleeding: Secondary | ICD-10-CM | POA: Diagnosis present

## 2021-04-07 DIAGNOSIS — E43 Unspecified severe protein-calorie malnutrition: Secondary | ICD-10-CM | POA: Diagnosis not present

## 2021-04-07 DIAGNOSIS — Z681 Body mass index (BMI) 19 or less, adult: Secondary | ICD-10-CM

## 2021-04-07 DIAGNOSIS — S2249XA Multiple fractures of ribs, unspecified side, initial encounter for closed fracture: Secondary | ICD-10-CM | POA: Diagnosis not present

## 2021-04-07 DIAGNOSIS — K219 Gastro-esophageal reflux disease without esophagitis: Secondary | ICD-10-CM | POA: Diagnosis present

## 2021-04-07 DIAGNOSIS — E878 Other disorders of electrolyte and fluid balance, not elsewhere classified: Secondary | ICD-10-CM | POA: Diagnosis present

## 2021-04-07 LAB — MAGNESIUM: Magnesium: 1.9 mg/dL (ref 1.7–2.4)

## 2021-04-07 LAB — COMPREHENSIVE METABOLIC PANEL
ALT: 43 U/L (ref 0–44)
AST: 29 U/L (ref 15–41)
Albumin: 3.1 g/dL — ABNORMAL LOW (ref 3.5–5.0)
Alkaline Phosphatase: 68 U/L (ref 38–126)
Anion gap: 6 (ref 5–15)
BUN: 14 mg/dL (ref 8–23)
CO2: 26 mmol/L (ref 22–32)
Calcium: 8.8 mg/dL — ABNORMAL LOW (ref 8.9–10.3)
Chloride: 115 mmol/L — ABNORMAL HIGH (ref 98–111)
Creatinine, Ser: 0.83 mg/dL (ref 0.44–1.00)
GFR, Estimated: >60 mL/min (ref >60)
Glucose, Bld: 67 mg/dL — ABNORMAL LOW (ref 70–99)
Potassium: 3.9 mmol/L (ref 3.5–5.1)
Sodium: 147 mmol/L — ABNORMAL HIGH (ref 135–145)
Total Bilirubin: 0.5 mg/dL (ref 0.3–1.2)
Total Protein: 6.8 g/dL (ref 6.5–8.1)

## 2021-04-07 LAB — CBC WITH DIFFERENTIAL/PLATELET
Abs Immature Granulocytes: 0.01 10*3/uL (ref 0.00–0.07)
Basophils Absolute: 0 10*3/uL (ref 0.0–0.1)
Basophils Relative: 1 %
Eosinophils Absolute: 0.1 10*3/uL (ref 0.0–0.5)
Eosinophils Relative: 2 %
HCT: 50.1 % — ABNORMAL HIGH (ref 36.0–46.0)
Hemoglobin: 15.6 g/dL — ABNORMAL HIGH (ref 12.0–15.0)
Immature Granulocytes: 0 %
Lymphocytes Relative: 15 %
Lymphs Abs: 1 10*3/uL (ref 0.7–4.0)
MCH: 29.3 pg (ref 26.0–34.0)
MCHC: 31.1 g/dL (ref 30.0–36.0)
MCV: 94 fL (ref 80.0–100.0)
Monocytes Absolute: 0.5 10*3/uL (ref 0.1–1.0)
Monocytes Relative: 8 %
Neutro Abs: 4.8 10*3/uL (ref 1.7–7.7)
Neutrophils Relative %: 74 %
Platelets: 199 10*3/uL (ref 150–400)
RBC: 5.33 MIL/uL — ABNORMAL HIGH (ref 3.87–5.11)
RDW: 14 % (ref 11.5–15.5)
WBC: 6.4 10*3/uL (ref 4.0–10.5)
nRBC: 0 % (ref 0.0–0.2)

## 2021-04-07 LAB — RESP PANEL BY RT-PCR (FLU A&B, COVID) ARPGX2
Influenza A by PCR: NEGATIVE
Influenza B by PCR: NEGATIVE
SARS Coronavirus 2 by RT PCR: NEGATIVE

## 2021-04-07 LAB — URINALYSIS, ROUTINE W REFLEX MICROSCOPIC
Bacteria, UA: NONE SEEN
Bilirubin Urine: NEGATIVE
Glucose, UA: NEGATIVE mg/dL
Hgb urine dipstick: NEGATIVE
Ketones, ur: NEGATIVE mg/dL
Leukocytes,Ua: NEGATIVE
Nitrite: NEGATIVE
Protein, ur: 30 mg/dL — AB
Specific Gravity, Urine: 1.014 (ref 1.005–1.030)
pH: 6 (ref 5.0–8.0)

## 2021-04-07 LAB — LACTIC ACID, PLASMA: Lactic Acid, Venous: 0.7 mmol/L (ref 0.5–1.9)

## 2021-04-07 LAB — PROTIME-INR
INR: 1 (ref 0.8–1.2)
Prothrombin Time: 13.3 seconds (ref 11.4–15.2)

## 2021-04-07 LAB — CBG MONITORING, ED
Glucose-Capillary: 58 mg/dL — ABNORMAL LOW (ref 70–99)
Glucose-Capillary: 279 mg/dL — ABNORMAL HIGH (ref 70–99)

## 2021-04-07 LAB — APTT: aPTT: 23 seconds — ABNORMAL LOW (ref 24–36)

## 2021-04-07 LAB — PHOSPHORUS: Phosphorus: 2.7 mg/dL (ref 2.5–4.6)

## 2021-04-07 LAB — TROPONIN I (HIGH SENSITIVITY): Troponin I (High Sensitivity): 10 ng/L (ref <18)

## 2021-04-07 MED ORDER — ACETAMINOPHEN 650 MG RE SUPP: 650.0000 mg | Freq: Four times a day (QID) | RECTAL | Status: DC | PRN

## 2021-04-07 MED ORDER — ACETAMINOPHEN 325 MG PO TABS
650.0000 mg | ORAL_TABLET | Freq: Once | ORAL | Status: AC
  Administered 2021-04-07: 650 mg via ORAL
  Filled 2021-04-07: qty 2

## 2021-04-07 MED ORDER — ENOXAPARIN SODIUM 40 MG/0.4ML IJ SOSY: 40.0000 mg | PREFILLED_SYRINGE | INTRAMUSCULAR | Status: DC

## 2021-04-07 MED ORDER — ONDANSETRON HCL 4 MG/2ML IJ SOLN: 4.0000 mg | Freq: Four times a day (QID) | INTRAMUSCULAR | Status: DC | PRN

## 2021-04-07 MED ORDER — ADULT MULTIVITAMIN W/MINERALS CH
1.0000 | ORAL_TABLET | Freq: Every day | ORAL | Status: DC
  Administered 2021-04-07 – 2021-04-11 (×4): 1 via ORAL
  Filled 2021-04-07 (×4): qty 1

## 2021-04-07 MED ORDER — LORAZEPAM 1 MG PO TABS: 1.0000 mg | ORAL_TABLET | ORAL | Status: DC | PRN

## 2021-04-07 MED ORDER — THIAMINE HCL 100 MG/ML IJ SOLN
500.0000 mg | Freq: Three times a day (TID) | INTRAVENOUS | Status: AC
  Administered 2021-04-07 – 2021-04-10 (×9): 500 mg via INTRAVENOUS
  Filled 2021-04-07 (×9): qty 5

## 2021-04-07 MED ORDER — LORAZEPAM 2 MG/ML IJ SOLN: 1.0000 mg | INTRAMUSCULAR | Status: DC | PRN

## 2021-04-07 MED ORDER — HEPARIN SODIUM (PORCINE) 5000 UNIT/ML IJ SOLN
5000.0000 [IU] | Freq: Two times a day (BID) | INTRAMUSCULAR | Status: DC
  Administered 2021-04-07 – 2021-04-11 (×7): 5000 [IU] via SUBCUTANEOUS
  Filled 2021-04-07 (×7): qty 1

## 2021-04-07 MED ORDER — FOLIC ACID 1 MG PO TABS
1.0000 mg | ORAL_TABLET | Freq: Every day | ORAL | Status: DC
  Administered 2021-04-07 – 2021-04-11 (×4): 1 mg via ORAL
  Filled 2021-04-07 (×4): qty 1

## 2021-04-07 MED ORDER — DEXTROSE-NACL 5-0.45 % IV SOLN: INTRAVENOUS | Status: DC

## 2021-04-07 MED ORDER — ONDANSETRON HCL 4 MG PO TABS: 4.0000 mg | ORAL_TABLET | Freq: Four times a day (QID) | ORAL | Status: DC | PRN

## 2021-04-07 MED ORDER — LACTATED RINGERS IV SOLN: INTRAVENOUS | Status: DC

## 2021-04-07 MED ORDER — ACETAMINOPHEN 325 MG PO TABS
650.0000 mg | ORAL_TABLET | Freq: Four times a day (QID) | ORAL | Status: DC | PRN
  Administered 2021-04-08 (×2): 650 mg via ORAL
  Filled 2021-04-07 (×2): qty 2

## 2021-04-07 MED ORDER — DEXTROSE 50 % IV SOLN
1.0000 | Freq: Once | INTRAVENOUS | Status: AC
  Administered 2021-04-07: 50 mL via INTRAVENOUS
  Filled 2021-04-07: qty 50

## 2021-04-07 NOTE — ED Triage Notes (Signed)
"  Altered mental status last couple of days, history of dementia, but worse last couple of days, had UTI a few weeks ago and finished antibiotics, but urine has foul odor" per EMS

## 2021-04-07 NOTE — ED Provider Notes (Signed)
  Physical Exam  BP 128/77   Pulse (!) 106   Temp 98.6 F (37 C) (Rectal)   Resp (!) 21   Ht 5\' 4"  (1.626 m)   Wt 40.8 kg   SpO2 99%   BMI 15.45 kg/m   Physical Exam  ED Course/Procedures     Procedures  MDM  Patient with mental status change.  More confused.  Here with son.  Has been on and off antibiotics for UTI as an outpatient.  Although reportedly dipsticks are positive but then cultures are coming back negative.  However patient appears dehydrated here.  Hemoglobin is 15.6 which is higher from baseline of around 13.  CMP shows a hypernatremia and hyperchloremia.  Also had a low glucose of 67.  Then dipped down to 58.  Improved with D50 and oral intake.  With confusion and likely dehydration feels the patient will benefit from mission to the hospital.  Will discuss with hospitalist       Davonna Belling, MD 04/07/21 2028

## 2021-04-07 NOTE — ED Provider Notes (Signed)
[facility] DEPT Provider Note   CSN: [id number] Arrival date & time: 04/07/21  1213     History Chief Complaint  Patient presents with   Altered Mental Status    Andrea Larson is a 75 y.o. female.  75 year old female presents with altered mental status.  Has history of dementia.  Has concerns for possible UTI as her urine is foul-smelling.  Symptoms began for the past 3 days according to EMS.  Patient cannot give any history at this time.  Did have a UTI recently and completed antibiotics a few days ago.  No reported trauma.  No recent cough.     Past Medical History:  Diagnosis Date   Allergic rhinitis    Allergic rhinitis, cause unspecified 09/21/2011   Aortic stenosis 08/23/2016   Arthritis    Asthma    Asthma 09/21/2011   Colon polyps    COPD (chronic obstructive pulmonary disease) (HCC)    mild    Coronary artery calcification 08/23/2016   Dementia ([place])    Depression with anxiety    HTN (hypertension)    Hyperlipidemia    UTI (lower urinary tract infection)     Patient Active Problem List   Diagnosis Date Noted   Aortic atherosclerosis ([place]) 04/04/2021   Depression with anxiety    Confusion 12/17/2020   History of colon polyps 10/17/2020   Vitamin D deficiency 10/17/2020   Dementia without behavioral disturbance ([place]) 08/09/2020   Gait abnormality 08/09/2020   Chronic hypoxemic respiratory failure ([place]) 04/10/2020   Dementia ([place]) 06/24/2018   Memory loss 06/24/2018   Weight loss 06/24/2018   Peripheral edema 05/21/2018   Gait disorder 05/21/2018   Diarrhea 04/28/2018   CKD (chronic kidney disease), stage III ([place]) 04/28/2018   Malnutrition of moderate degree 04/13/2018   Essential hypertension 04/11/2018   Gallstones 04/11/2018   Hypokalemia 03/21/2018   UTI (urinary tract infection) 02/19/2018   Coronary artery calcification 08/23/2016   Aortic stenosis 08/23/2016   Chest pain 07/20/2016   Hyperglycemia 07/20/2016    Insomnia 08/18/2015   GERD (gastroesophageal reflux disease) 08/18/2015   Acute bronchitis 02/15/2015   Back pain 10/28/2012   Asthma 09/21/2011   Allergic rhinitis 09/21/2011   Colon polyps 09/21/2011   Fatigue 09/21/2011   Encounter for well adult exam with abnormal findings 09/15/2011   DEGENERATIVE JOINT DISEASE, CERVICAL SPINE 10/21/2008   Overweight(278.02) 02/10/2008   ANXIETY DEPRESSION 02/10/2008   COPD (chronic obstructive pulmonary disease) ([place]) 02/10/2008   EMPHYSEMA NEC 01/30/2007   Hyperlipidemia 12/31/2006    Past Surgical History:  Procedure Laterality Date   DENTAL SURGERY     pilonidial cyst     TONSILLECTOMY       OB History   No obstetric history on file.     Family History  Problem Relation Age of Onset   Alcohol abuse Other    Arthritis Other    Heart disease Other    Stroke Other    Hypertension Other    Hypertension Father    Stroke Father    Atrial fibrillation Brother    Other Mother        unsure of medical history   Colon cancer Neg Hx     Social History   Tobacco Use   Smoking status: Former    Packs/day: 0.75    Years: 55.00    Pack years: 41.25    Types: Cigarettes    Quit date: 04/18/2015    Years since quitting: 5.9  Smokeless tobacco: Never  Vaping Use   Vaping Use: Never used  Substance Use Topics   Alcohol use: Yes    Comment: wine couple times each week - daily at times   Drug use: Never    Home Medications Prior to Admission medications   Medication Sig Start Date End Date Taking? Authorizing Provider  acetaminophen (TYLENOL) 325 MG tablet Take 650 mg by mouth every 6 (six) hours as needed for mild pain or headache.    [provider]  amLODipine (NORVASC) 5 MG tablet Take 1 tablet (5 mg total) by mouth daily. 12/16/20 12/16/21  Biagio Borg, MD  buPROPion (WELLBUTRIN XL) 150 MG 24 hr tablet Take 1 tablet (150 mg total) by mouth daily. 10/12/20   Biagio Borg, MD  Cholecalciferol (THERA-D 2000) 50  MCG (2000 UT) TABS 1 tab by mouth once daily 12/16/20   Biagio Borg, MD  clonazePAM (KLONOPIN) 0.5 MG tablet Take 1 tablet (0.5 mg total) by mouth 2 (two) times daily as needed for anxiety. 12/16/20   Biagio Borg, MD  donepezil (ARICEPT) 5 MG tablet Take 1 tablet (5 mg total) by mouth at bedtime. 04/03/21 05/03/21  Ngetich, Dinah C, NP  feeding supplement, ENSURE ENLIVE, (ENSURE ENLIVE) LIQD Take 237 mLs by mouth 3 (three) times daily between meals. 04/21/18   Biagio Borg, MD  FLUoxetine (PROZAC) 40 MG capsule TAKE (1) CAPSULE DAILY. 10/12/20   Biagio Borg, MD  levalbuterol [facility] HFA) 45 MCG/ACT inhaler USE 2 PUFFS EVERY 6 HOURS AS NEEDED FOR WHEEZING. **SHAKE WELL** 07/07/19   Mannam, Praveen, MD  levalbuterol (XOPENEX) 0.63 MG/3ML nebulizer solution USE 1 VIAL VIA NEBULIZER EVERY 6 HOURS AS NEEDED FOR WHEEZING OR SHORTNESS OF BREATH. 04/15/19   Mannam, Hart Robinsons, MD  Lidocaine (HM LIDOCAINE PATCH) 4 % PTCH Apply 1 patch topically every 12 (twelve) hours as needed. 02/26/21   Loni Beckwith, PA-C  melatonin 5 MG TABS Take 5 mg by mouth at bedtime.    [provider]  memantine (NAMENDA) 10 MG tablet Take 1 tablet (10 mg total) by mouth 2 (two) times daily. 10/12/20   Biagio Borg, MD  mirtazapine (REMERON) 7.5 MG tablet Take 1 tablet (7.5 mg total) by mouth at bedtime. 04/03/21   Ngetich, Dinah C, NP  montelukast (SINGULAIR) 10 MG tablet Take 1 tablet (10 mg total) by mouth daily. 10/12/20   Biagio Borg, MD  Respiratory Therapy Supplies (FLUTTER) DEVI Use as directed 08/19/17   Marshell Garfinkel, MD  rosuvastatin (CRESTOR) 20 MG tablet Take 1 tablet (20 mg total) by mouth daily. 10/12/20   Biagio Borg, MD  SPIRIVA RESPIMAT 2.5 MCG/ACT AERS USE 1 PUFF DAILY AS DIRECTED 01/02/21   Biagio Borg, MD  traMADol (ULTRAM) 50 MG tablet Take 1 tablet (50 mg total) by mouth every 6 (six) hours as needed. 03/08/21   Biagio Borg, MD  [place] 200-62.5-25 MCG/INH AEPB INHALE 1 PUFF INTO THE  LUNGS ONCE DAILY AS DIRECTED. 03/19/21   Marshell Garfinkel, MD    Allergies    Lipitor [atorvastatin]  Review of Systems   Review of Systems  Unable to perform ROS: Dementia   Physical Exam Updated Vital Signs There were no vitals taken for this visit.  Physical Exam Vitals and nursing note reviewed.  Constitutional:      General: She is not in acute distress.    Appearance: Normal appearance. She is well-developed. She is not toxic-appearing.  HENT:     Head: Normocephalic and atraumatic.  Eyes:     General: Lids are normal.     Conjunctiva/sclera: Conjunctivae normal.     Pupils: Pupils are equal, round, and reactive to light.  Neck:     Thyroid: No thyroid mass.     Trachea: No tracheal deviation.  Cardiovascular:     Rate and Rhythm: Normal rate and regular rhythm.     Heart sounds: Normal heart sounds. No murmur heard.   No gallop.  Pulmonary:     Effort: Pulmonary effort is normal. No respiratory distress.     Breath sounds: Normal breath sounds. No stridor. No decreased breath sounds, wheezing, rhonchi or rales.  Abdominal:     General: There is no distension.     Palpations: Abdomen is soft.     Tenderness: There is no abdominal tenderness. There is no rebound.  Musculoskeletal:        General: No tenderness. Normal range of motion.     Cervical back: Normal range of motion and neck supple.  Skin:    General: Skin is warm and dry.     Findings: No abrasion or rash.  Neurological:     Mental Status: She is alert. She is confused.     GCS: GCS eye subscore is 4. GCS verbal subscore is 5. GCS motor subscore is 6.     Cranial Nerves: Cranial nerves are intact. No cranial nerve deficit.     Sensory: No sensory deficit.     Motor: Motor function is intact.  Psychiatric:        Attention and Perception: Attention normal.        Speech: Speech is delayed.        Behavior: Behavior is slowed.   ED Results / Procedures / Treatments   Labs (all labs ordered are  listed, but only abnormal results are displayed) Labs Reviewed  RESP PANEL BY RT-PCR (FLU A&B, COVID) ARPGX2  CULTURE, BLOOD (ROUTINE X 2)  CULTURE, BLOOD (ROUTINE X 2)  URINE CULTURE  LACTIC ACID, PLASMA  LACTIC ACID, PLASMA  COMPREHENSIVE METABOLIC PANEL  CBC WITH DIFFERENTIAL/PLATELET  PROTIME-INR  APTT  URINALYSIS, ROUTINE W REFLEX MICROSCOPIC    EKG None  Radiology No results found.  Procedures Procedures   Medications Ordered in ED Medications  lactated ringers infusion (has no administration in time range)    ED Course  I have reviewed the triage vital signs and the nursing notes.  Pertinent labs & imaging results that were available during my care of the patient were reviewed by me and considered in my medical decision making (see chart for details).    MDM Rules/Calculators/A&P                           Work-up pending at this time and signed out to Dr. Alvino Chapel  Final Clinical Impression(s) / ED Diagnoses Final diagnoses:  None    Rx / DC Orders ED Discharge Orders     None        Lacretia Leigh, MD 04/07/21 1525

## 2021-04-07 NOTE — H&P (Signed)
History and Physical    Andrea Larson EXN:[id number] DOB: 15-Dec-1945 DOA: 04/07/2021  PCP: Sandrea Hughs, NP  Patient coming from: Home  I have personally briefly reviewed patient's old medical records in [place]  Chief Complaint: Hallucinations  HPI: Andrea Larson is a 75 y.o. female with medical history significant of HTN, COPD, former heavy EtOH use, dementia.  Pt with several months of wt loss, adult failure to thrive.  Pt with 1 month h/o intermittent confusion.  ? UTI, on and off ABx.  Occasional hallucinations.  Past 3 days having on and off hallucinations.  This morning speech slurred prompting son to bring her in to ED.  No cough, no recent trauma.   ED Course: UA neg for UTI.  Work up shows mild dehydration and hypoglycemia (BGL in the 50s/60s).   Review of Systems: As per HPI, otherwise all review of systems negative.  Past Medical History:  Diagnosis Date   Allergic rhinitis    Allergic rhinitis, cause unspecified 09/21/2011   Aortic stenosis 08/23/2016   Arthritis    Asthma    Asthma 09/21/2011   Colon polyps    COPD (chronic obstructive pulmonary disease) (HCC)    mild    Coronary artery calcification 08/23/2016   Dementia (HCC)    Depression with anxiety    HTN (hypertension)    Hyperlipidemia    UTI (lower urinary tract infection)     Past Surgical History:  Procedure Laterality Date   DENTAL SURGERY     pilonidial cyst     TONSILLECTOMY       reports that she quit smoking about 5 years ago. Her smoking use included cigarettes. She has a 41.25 pack-year smoking history. She has never used smokeless tobacco. She reports current alcohol use. She reports that she does not use drugs.  Allergies  Allergen Reactions   Lipitor [Atorvastatin] Other (See Comments)    Myalgias    Family History  Problem Relation Age of Onset   Alcohol abuse Other    Arthritis Other    Heart disease Other    Stroke Other    Hypertension  Other    Hypertension Father    Stroke Father    Atrial fibrillation Brother    Other Mother        unsure of medical history   Colon cancer Neg Hx      Prior to Admission medications   Medication Sig Start Date End Date Taking? Authorizing Provider  acetaminophen (TYLENOL) 325 MG tablet Take 650 mg by mouth every 6 (six) hours as needed for mild pain or headache.   Yes [provider]  amLODipine (NORVASC) 5 MG tablet Take 1 tablet (5 mg total) by mouth daily. 12/16/20 12/16/21 Yes Biagio Borg, MD  buPROPion (WELLBUTRIN XL) 150 MG 24 hr tablet Take 1 tablet (150 mg total) by mouth daily. 10/12/20  Yes Biagio Borg, MD  Cholecalciferol (THERA-D 2000) 50 MCG (2000 UT) TABS 1 tab by mouth once daily 12/16/20  Yes Biagio Borg, MD  clonazePAM (KLONOPIN) 0.5 MG tablet Take 1 tablet (0.5 mg total) by mouth 2 (two) times daily as needed for anxiety. 12/16/20  Yes Biagio Borg, MD  donepezil (ARICEPT) 5 MG tablet Take 1 tablet (5 mg total) by mouth at bedtime. 04/03/21 05/03/21 Yes Ngetich, Dinah C, NP  FLUoxetine (PROZAC) 40 MG capsule TAKE (1) CAPSULE DAILY. 10/12/20  Yes Biagio Borg, MD  melatonin 5 MG TABS Take  5 mg by mouth at bedtime.   Yes [provider]  memantine (NAMENDA) 10 MG tablet Take 1 tablet (10 mg total) by mouth 2 (two) times daily. 10/12/20  Yes Biagio Borg, MD  mirtazapine (REMERON) 7.5 MG tablet Take 1 tablet (7.5 mg total) by mouth at bedtime. 04/03/21  Yes Ngetich, Dinah C, NP  montelukast (SINGULAIR) 10 MG tablet Take 1 tablet (10 mg total) by mouth daily. 10/12/20  Yes Biagio Borg, MD  rosuvastatin (CRESTOR) 20 MG tablet Take 1 tablet (20 mg total) by mouth daily. 10/12/20  Yes Biagio Borg, MD  SPIRIVA RESPIMAT 2.5 MCG/ACT AERS USE 1 PUFF DAILY AS DIRECTED 01/02/21  Yes Biagio Borg, MD  TRELEGY ELLIPTA 200-62.5-25 MCG/INH AEPB INHALE 1 PUFF INTO THE LUNGS ONCE DAILY AS DIRECTED. 03/19/21  Yes Mannam, Praveen, MD  feeding supplement, ENSURE ENLIVE, (ENSURE  ENLIVE) LIQD Take 237 mLs by mouth 3 (three) times daily between meals. Patient not taking: Reported on 04/07/2021 04/21/18   Biagio Borg, MD  levalbuterol [facility] HFA) 45 MCG/ACT inhaler USE 2 PUFFS EVERY 6 HOURS AS NEEDED FOR WHEEZING. **SHAKE WELL** Patient not taking: Reported on 04/07/2021 07/07/19   Mannam, Hart Robinsons, MD  levalbuterol (XOPENEX) 0.63 MG/3ML nebulizer solution USE 1 VIAL VIA NEBULIZER EVERY 6 HOURS AS NEEDED FOR WHEEZING OR SHORTNESS OF BREATH. Patient not taking: Reported on 04/07/2021 04/15/19   Marshell Garfinkel, MD  Lidocaine (HM LIDOCAINE PATCH) 4 % PTCH Apply 1 patch topically every 12 (twelve) hours as needed. Patient not taking: Reported on 04/07/2021 02/26/21   Loni Beckwith, PA-C  Respiratory Therapy Supplies (FLUTTER) DEVI Use as directed 08/19/17   Marshell Garfinkel, MD  traMADol (ULTRAM) 50 MG tablet Take 1 tablet (50 mg total) by mouth every 6 (six) hours as needed. Patient not taking: Reported on 04/07/2021 03/08/21   Biagio Borg, MD    Physical Exam: Vitals:   04/07/21 1845 04/07/21 1945 04/07/21 2045 04/07/21 2100  BP: 127/65 128/77 135/70 132/70  Pulse: 91 (!) 106 88 92  Resp: 17 (!) 21 (!) 21 (!) 21  Temp:      TempSrc:      SpO2: 99% 99% 100% 100%  Weight:      Height:        Constitutional: NAD, calm, comfortable Eyes: PERRL, lids and conjunctivae normal ENMT: Mucous membranes are moist. Posterior pharynx clear of any exudate or lesions.Normal dentition.  Neck: normal, supple, no masses, no thyromegaly Respiratory: clear to auscultation bilaterally, no wheezing, no crackles. Normal respiratory effort. No accessory muscle use.  Cardiovascular: Regular rate and rhythm, no murmurs / rubs / gallops. No extremity edema. 2+ pedal pulses. No carotid bruits.  Abdomen: no tenderness, no masses palpated. No hepatosplenomegaly. Bowel sounds positive.  Musculoskeletal: no clubbing / cyanosis. No joint deformity upper and lower extremities. Good ROM, no  contractures. Normal muscle tone.  Skin: no rashes, lesions, ulcers. No induration Neurologic: CN 2-12 grossly intact. Sensation intact, DTR normal. Strength 5/5 in all 4.  Psychiatric: Delayed and slowed, confused, pleasant.   Labs on Admission: I have personally reviewed following labs and imaging studies  CBC: Recent Labs  Lab 04/07/21 1323  WBC 6.4  NEUTROABS 4.8  HGB 15.6*  HCT 50.1*  MCV 94.0  PLT 622   Basic Metabolic Panel: Recent Labs  Lab 04/07/21 1323  NA 147*  K 3.9  CL 115*  CO2 26  GLUCOSE 67*  BUN 14  CREATININE 0.83  CALCIUM 8.8*  GFR: Estimated Creatinine Clearance: 36.6 mL/min (by C-G formula based on SCr of 0.83 mg/dL). Liver Function Tests: Recent Labs  Lab 04/07/21 1323  AST 29  ALT 43  ALKPHOS 68  BILITOT 0.5  PROT 6.8  ALBUMIN 3.1*   No results for input(s): LIPASE, AMYLASE in the last 168 hours. No results for input(s): AMMONIA in the last 168 hours. Coagulation Profile: Recent Labs  Lab 04/07/21 1323  INR 1.0   Cardiac Enzymes: No results for input(s): CKTOTAL, CKMB, CKMBINDEX, TROPONINI in the last 168 hours. BNP (last 3 results) No results for input(s): PROBNP in the last 8760 hours. HbA1C: No results for input(s): HGBA1C in the last 72 hours. CBG: Recent Labs  Lab 04/07/21 1845 04/07/21 1946  GLUCAP 58* 279*   Lipid Profile: No results for input(s): CHOL, HDL, LDLCALC, TRIG, CHOLHDL, LDLDIRECT in the last 72 hours. Thyroid Function Tests: No results for input(s): TSH, T4TOTAL, FREET4, T3FREE, THYROIDAB in the last 72 hours. Anemia Panel: No results for input(s): VITAMINB12, FOLATE, FERRITIN, TIBC, IRON, RETICCTPCT in the last 72 hours. Urine analysis:    Component Value Date/Time   COLORURINE YELLOW 04/07/2021 1323   APPEARANCEUR CLEAR 04/07/2021 1323   LABSPEC 1.014 04/07/2021 1323   PHURINE 6.0 04/07/2021 1323   GLUCOSEU NEGATIVE 04/07/2021 1323   GLUCOSEU 100 (A) 03/20/2021 1556   HGBUR NEGATIVE  04/07/2021 1323   HGBUR negative 06/20/2010 1406   BILIRUBINUR NEGATIVE 04/07/2021 1323   BILIRUBINUR Negative 04/03/2021 1536   KETONESUR NEGATIVE 04/07/2021 1323   PROTEINUR 30 (A) 04/07/2021 1323   UROBILINOGEN negative (A) 04/03/2021 1536   UROBILINOGEN 0.2 03/20/2021 1556   NITRITE NEGATIVE 04/07/2021 1323   LEUKOCYTESUR NEGATIVE 04/07/2021 1323    Radiological Exams on Admission: CT Head Wo Contrast  Result Date: 04/07/2021 CLINICAL DATA:  Altered mental status Dementia Possible UTI EXAM: CT HEAD WITHOUT CONTRAST TECHNIQUE: Contiguous axial images were obtained from the base of the skull through the vertex without intravenous contrast. COMPARISON:  CT head 02/26/2021 FINDINGS: Brain: No evidence of acute infarction, hemorrhage, hydrocephalus, extra-axial collection or mass lesion/mass effect. Vascular: No hyperdense vessel or unexpected calcification. Skull: Normal. Negative for fracture or focal lesion. Sinuses/Orbits: Partial opacification of the left sphenoid sinus. Other: None. IMPRESSION: No acute intracranial abnormality. Electronically Signed   By: Miachel Roux M.D.   On: 04/07/2021 16:21   DG Chest Port 1 View  Result Date: 04/07/2021 CLINICAL DATA:  Questionable sepsis - evaluate for abnormality EXAM: PORTABLE CHEST 1 VIEW COMPARISON:  02/26/2021. FINDINGS: The heart size and mediastinal contours are within normal limits. Both lungs are clear. No visible pleural effusions or pneumothorax. Old rib fractures better seen on the prior. IMPRESSION: No acute cardiopulmonary disease. Electronically Signed   By: Margaretha Sheffield M.D.   On: 04/07/2021 13:27    EKG: Independently reviewed.  Assessment/Plan Principal Problem:   Dehydration Active Problems:   COPD (chronic obstructive pulmonary disease) (HCC)   Essential hypertension   Dementia without behavioral disturbance (HCC)    Dehydration and mild hypoglycemia - IVF: D5 half at 75 Repeat BMP in AM CBG checks  Q4H Dementia - Cont home meds Likely with AFT given wt loss Getting MRI given the hallucinations, but doesn't sound like stroke Giving Thiamine Dietary consult Check Mg and PO4 Former EtOH use/abuse - Dry for 9 months now per Son. COPD - cont home nebs HTN - cont amlodipine  DVT prophylaxis: Heparin [place] Code Status: Full Family Communication: Son at bedside Disposition Plan: Home after  rehydration and MRI brain most likely Consults called: None Admission status: Place in obs    Nate Perri, [place] Hospitalists  How to contact the [facility] Attending or Consulting provider [place] or covering provider during after hours [place], for this patient?  Check the care team in [facility] and look for a) attending/consulting [facility] provider listed and b) the [facility] team listed Log into [email or web address]  Amion Physician Scheduling and messaging for groups and whole hospitals  On call and physician scheduling software for group practices, residents, hospitalists and other medical providers for call, clinic, rotation and shift schedules. OnCall [employer] is a hospital-wide system for scheduling doctors and paging doctors on call. EasyPlot is for scientific plotting and data analysis.  [email or web address]  and use [place] universal password to access. If you do not have the password, please contact the hospital operator.  Locate the [facility] provider you are looking for under [facility] and page to a number that you can be directly reached. If you still have difficulty reaching the provider, please page the [facility] (Director on Call) for the Hospitalists listed on amion for assistance.  04/07/2021, 9:20 PM

## 2021-04-07 NOTE — Telephone Encounter (Signed)
Patient's son called and left message on clinical intake voicemail. He stated that patient's condition has gotten worse so they took her to the ER at [facility]. Son stated that patient was having hallucinations, slurred speech and blank stare so they called EMS.

## 2021-04-08 DIAGNOSIS — N390 Urinary tract infection, site not specified: Secondary | ICD-10-CM | POA: Diagnosis present

## 2021-04-08 DIAGNOSIS — R1310 Dysphagia, unspecified: Secondary | ICD-10-CM | POA: Diagnosis present

## 2021-04-08 DIAGNOSIS — I10 Essential (primary) hypertension: Secondary | ICD-10-CM | POA: Diagnosis present

## 2021-04-08 DIAGNOSIS — K2100 Gastro-esophageal reflux disease with esophagitis, without bleeding: Secondary | ICD-10-CM | POA: Diagnosis present

## 2021-04-08 DIAGNOSIS — Z681 Body mass index (BMI) 19 or less, adult: Secondary | ICD-10-CM | POA: Diagnosis not present

## 2021-04-08 DIAGNOSIS — Z20822 Contact with and (suspected) exposure to covid-19: Secondary | ICD-10-CM | POA: Diagnosis present

## 2021-04-08 DIAGNOSIS — R451 Restlessness and agitation: Secondary | ICD-10-CM | POA: Diagnosis not present

## 2021-04-08 DIAGNOSIS — E43 Unspecified severe protein-calorie malnutrition: Secondary | ICD-10-CM | POA: Diagnosis present

## 2021-04-08 DIAGNOSIS — G9340 Encephalopathy, unspecified: Secondary | ICD-10-CM | POA: Diagnosis not present

## 2021-04-08 DIAGNOSIS — Z7951 Long term (current) use of inhaled steroids: Secondary | ICD-10-CM | POA: Diagnosis not present

## 2021-04-08 DIAGNOSIS — I2510 Atherosclerotic heart disease of native coronary artery without angina pectoris: Secondary | ICD-10-CM | POA: Diagnosis present

## 2021-04-08 DIAGNOSIS — K219 Gastro-esophageal reflux disease without esophagitis: Secondary | ICD-10-CM | POA: Diagnosis not present

## 2021-04-08 DIAGNOSIS — F05 Delirium due to known physiological condition: Secondary | ICD-10-CM | POA: Diagnosis present

## 2021-04-08 DIAGNOSIS — Z79899 Other long term (current) drug therapy: Secondary | ICD-10-CM | POA: Diagnosis not present

## 2021-04-08 DIAGNOSIS — F0390 Unspecified dementia without behavioral disturbance: Secondary | ICD-10-CM | POA: Diagnosis present

## 2021-04-08 DIAGNOSIS — R627 Adult failure to thrive: Secondary | ICD-10-CM | POA: Diagnosis present

## 2021-04-08 DIAGNOSIS — E870 Hyperosmolality and hypernatremia: Secondary | ICD-10-CM | POA: Diagnosis present

## 2021-04-08 DIAGNOSIS — G9341 Metabolic encephalopathy: Secondary | ICD-10-CM | POA: Diagnosis present

## 2021-04-08 DIAGNOSIS — Z87891 Personal history of nicotine dependence: Secondary | ICD-10-CM | POA: Diagnosis not present

## 2021-04-08 DIAGNOSIS — F419 Anxiety disorder, unspecified: Secondary | ICD-10-CM | POA: Diagnosis present

## 2021-04-08 DIAGNOSIS — E785 Hyperlipidemia, unspecified: Secondary | ICD-10-CM | POA: Diagnosis present

## 2021-04-08 DIAGNOSIS — E860 Dehydration: Secondary | ICD-10-CM | POA: Diagnosis present

## 2021-04-08 DIAGNOSIS — K222 Esophageal obstruction: Secondary | ICD-10-CM | POA: Diagnosis not present

## 2021-04-08 DIAGNOSIS — Z888 Allergy status to other drugs, medicaments and biological substances status: Secondary | ICD-10-CM | POA: Diagnosis not present

## 2021-04-08 DIAGNOSIS — F32A Depression, unspecified: Secondary | ICD-10-CM | POA: Diagnosis present

## 2021-04-08 DIAGNOSIS — F418 Other specified anxiety disorders: Secondary | ICD-10-CM

## 2021-04-08 DIAGNOSIS — Z8249 Family history of ischemic heart disease and other diseases of the circulatory system: Secondary | ICD-10-CM | POA: Diagnosis not present

## 2021-04-08 DIAGNOSIS — R933 Abnormal findings on diagnostic imaging of other parts of digestive tract: Secondary | ICD-10-CM | POA: Diagnosis not present

## 2021-04-08 DIAGNOSIS — E162 Hypoglycemia, unspecified: Secondary | ICD-10-CM | POA: Diagnosis present

## 2021-04-08 DIAGNOSIS — J441 Chronic obstructive pulmonary disease with (acute) exacerbation: Secondary | ICD-10-CM | POA: Diagnosis present

## 2021-04-08 LAB — GLUCOSE, CAPILLARY
Glucose-Capillary: 133 mg/dL — ABNORMAL HIGH (ref 70–99)
Glucose-Capillary: 235 mg/dL — ABNORMAL HIGH (ref 70–99)
Glucose-Capillary: 55 mg/dL — ABNORMAL LOW (ref 70–99)
Glucose-Capillary: 70 mg/dL (ref 70–99)
Glucose-Capillary: 83 mg/dL (ref 70–99)
Glucose-Capillary: 98 mg/dL (ref 70–99)
Glucose-Capillary: 99 mg/dL (ref 70–99)

## 2021-04-08 LAB — CBC
HCT: 33.7 % — ABNORMAL LOW (ref 36.0–46.0)
Hemoglobin: 10.6 g/dL — ABNORMAL LOW (ref 12.0–15.0)
MCH: 29.9 pg (ref 26.0–34.0)
MCHC: 31.5 g/dL (ref 30.0–36.0)
MCV: 95.2 fL (ref 80.0–100.0)
Platelets: 298 10*3/uL (ref 150–400)
RBC: 3.54 MIL/uL — ABNORMAL LOW (ref 3.87–5.11)
RDW: 14 % (ref 11.5–15.5)
WBC: 9.2 10*3/uL (ref 4.0–10.5)
nRBC: 0 % (ref 0.0–0.2)

## 2021-04-08 LAB — BASIC METABOLIC PANEL
Anion gap: 5 (ref 5–15)
BUN: 11 mg/dL (ref 8–23)
CO2: 29 mmol/L (ref 22–32)
Calcium: 8.8 mg/dL — ABNORMAL LOW (ref 8.9–10.3)
Chloride: 107 mmol/L (ref 98–111)
Creatinine, Ser: 0.76 mg/dL (ref 0.44–1.00)
GFR, Estimated: >60 mL/min (ref >60)
Glucose, Bld: 82 mg/dL (ref 70–99)
Potassium: 3.8 mmol/L (ref 3.5–5.1)
Sodium: 141 mmol/L (ref 135–145)

## 2021-04-08 LAB — VITAMIN B12: Vitamin B-12: 313 pg/mL (ref 180–914)

## 2021-04-08 LAB — IRON AND TIBC
Iron: 48 ug/dL (ref 28–170)
Saturation Ratios: 19 % (ref 10.4–31.8)
TIBC: 253 ug/dL (ref 250–450)
UIBC: 205 ug/dL

## 2021-04-08 LAB — TROPONIN I (HIGH SENSITIVITY): Troponin I (High Sensitivity): 10 ng/L (ref <18)

## 2021-04-08 LAB — FERRITIN: Ferritin: 364 ng/mL — ABNORMAL HIGH (ref 11–307)

## 2021-04-08 LAB — FOLATE: Folate: 19.2 ng/mL (ref >5.9)

## 2021-04-08 MED ORDER — IPRATROPIUM BROMIDE 0.02 % IN SOLN
0.5000 mg | Freq: Four times a day (QID) | RESPIRATORY_TRACT | Status: DC
  Administered 2021-04-08 (×2): 0.5 mg via RESPIRATORY_TRACT
  Filled 2021-04-08 (×2): qty 2.5

## 2021-04-08 MED ORDER — MIRTAZAPINE 15 MG PO TABS
7.5000 mg | ORAL_TABLET | Freq: Every day | ORAL | Status: DC
  Administered 2021-04-08 – 2021-04-10 (×3): 7.5 mg via ORAL
  Filled 2021-04-08 (×3): qty 1

## 2021-04-08 MED ORDER — LORATADINE 10 MG PO TABS
10.0000 mg | ORAL_TABLET | Freq: Every day | ORAL | Status: DC
  Administered 2021-04-08 – 2021-04-11 (×3): 10 mg via ORAL
  Filled 2021-04-08 (×3): qty 1

## 2021-04-08 MED ORDER — MEGESTROL ACETATE 40 MG PO TABS
40.0000 mg | ORAL_TABLET | Freq: Every day | ORAL | Status: DC
  Administered 2021-04-08 – 2021-04-10 (×2): 40 mg via ORAL
  Filled 2021-04-08 (×4): qty 1

## 2021-04-08 MED ORDER — BUDESONIDE 0.5 MG/2ML IN SUSP: 0.5000 mg | Freq: Two times a day (BID) | RESPIRATORY_TRACT | Status: DC

## 2021-04-08 MED ORDER — ENSURE ENLIVE PO LIQD
237.0000 mL | Freq: Three times a day (TID) | ORAL | Status: DC
  Administered 2021-04-09 – 2021-04-11 (×5): 237 mL via ORAL

## 2021-04-08 MED ORDER — LEVALBUTEROL HCL 0.63 MG/3ML IN NEBU
0.6300 mg | INHALATION_SOLUTION | Freq: Four times a day (QID) | RESPIRATORY_TRACT | Status: DC
  Administered 2021-04-08 (×2): 0.63 mg via RESPIRATORY_TRACT
  Filled 2021-04-08 (×2): qty 3

## 2021-04-08 MED ORDER — METHYLPREDNISOLONE SODIUM SUCC 125 MG IJ SOLR
60.0000 mg | Freq: Two times a day (BID) | INTRAMUSCULAR | Status: DC
  Administered 2021-04-08 – 2021-04-09 (×2): 60 mg via INTRAVENOUS
  Filled 2021-04-08 (×2): qty 2

## 2021-04-08 MED ORDER — LORAZEPAM 1 MG PO TABS: 1.0000 mg | ORAL_TABLET | ORAL | Status: DC | PRN

## 2021-04-08 MED ORDER — DONEPEZIL HCL 10 MG PO TABS
5.0000 mg | ORAL_TABLET | Freq: Every day | ORAL | Status: DC
  Administered 2021-04-08 – 2021-04-10 (×3): 5 mg via ORAL
  Filled 2021-04-08 (×3): qty 1

## 2021-04-08 MED ORDER — BUDESONIDE 0.5 MG/2ML IN SUSP
0.5000 mg | Freq: Two times a day (BID) | RESPIRATORY_TRACT | Status: DC
  Administered 2021-04-08 – 2021-04-11 (×6): 0.5 mg via RESPIRATORY_TRACT
  Filled 2021-04-08 (×6): qty 2

## 2021-04-08 MED ORDER — MEMANTINE HCL 10 MG PO TABS
10.0000 mg | ORAL_TABLET | Freq: Two times a day (BID) | ORAL | Status: DC
  Administered 2021-04-08 – 2021-04-11 (×5): 10 mg via ORAL
  Filled 2021-04-08 (×5): qty 1

## 2021-04-08 MED ORDER — BUPROPION HCL ER (XL) 150 MG PO TB24
150.0000 mg | ORAL_TABLET | Freq: Every day | ORAL | Status: DC
  Administered 2021-04-08 – 2021-04-11 (×3): 150 mg via ORAL
  Filled 2021-04-08 (×3): qty 1

## 2021-04-08 MED ORDER — LORAZEPAM 2 MG/ML IJ SOLN
1.0000 mg | INTRAMUSCULAR | Status: DC | PRN
  Administered 2021-04-09: 1 mg via INTRAVENOUS
  Filled 2021-04-08: qty 1

## 2021-04-08 MED ORDER — CYANOCOBALAMIN 1000 MCG/ML IJ SOLN
1000.0000 ug | Freq: Every day | INTRAMUSCULAR | Status: DC
  Administered 2021-04-08 – 2021-04-11 (×3): 1000 ug via SUBCUTANEOUS
  Filled 2021-04-08 (×4): qty 1

## 2021-04-08 MED ORDER — FLUOXETINE HCL 20 MG PO CAPS
40.0000 mg | ORAL_CAPSULE | Freq: Every day | ORAL | Status: DC
  Administered 2021-04-08 – 2021-04-11 (×3): 40 mg via ORAL
  Filled 2021-04-08 (×3): qty 2

## 2021-04-08 MED ORDER — GUAIFENESIN ER 600 MG PO TB12
1200.0000 mg | ORAL_TABLET | Freq: Two times a day (BID) | ORAL | Status: DC
  Administered 2021-04-08 – 2021-04-11 (×5): 1200 mg via ORAL
  Filled 2021-04-08 (×5): qty 2

## 2021-04-08 MED ORDER — FLUTICASONE PROPIONATE 50 MCG/ACT NA SUSP
2.0000 | Freq: Every day | NASAL | Status: DC
  Administered 2021-04-08 – 2021-04-11 (×2): 2 via NASAL
  Filled 2021-04-08: qty 16

## 2021-04-08 MED ORDER — PANTOPRAZOLE SODIUM 40 MG PO TBEC
40.0000 mg | DELAYED_RELEASE_TABLET | Freq: Every day | ORAL | Status: DC
  Administered 2021-04-08 – 2021-04-11 (×3): 40 mg via ORAL
  Filled 2021-04-08 (×3): qty 1

## 2021-04-08 NOTE — Progress Notes (Signed)
Initial Nutrition Assessment  DOCUMENTATION CODES:  Underweight  INTERVENTION:  Continue current diet as ordered Magic cup TID with meals, each supplement provides 290 kcal and 9 grams of protein Ensure Enlive po TID, each supplement provides 350 kcal and 20 grams of protein  NUTRITION DIAGNOSIS:  Inadequate oral intake related to poor appetite as evidenced by  (underweight BMI).  GOAL:  Patient will meet greater than or equal to 90% of their needs  MONITOR:  PO intake, Supplement acceptance, Weight trends  REASON FOR ASSESSMENT:  Consult Assessment of nutrition requirement/status  ASSESSMENT:  75 year old female with hx of dementia, CKD3, COPD, HTN, HLD, depression, hx of EtOH abuse, and GERD present to ED with AMS and concerns for possible UTI.  Pt found ot be dehydrated on admission and with a mild COPD exacerbation.   Unable to obtain nutrition hx due to dementia. Will defer to in-person assessment. Noted that pt is underweight and has lost additional weight in the last month (3.3%, 8/21-9/30). Pt currently on a regular diet with a vitamin regimen in place. Intake this admission appears excellent so far. Will add nutrition supplements TID to encourage weight gain and promote adequate intake.  Average Meal Intake: 10/1: 100% intake x 2 recorded meals  Nutritionally Relevant Medications: Scheduled Meds:  cyanocobalamin  1,000 mcg Subcutaneous Daily   folic acid  1 mg Oral Daily   megestrol  40 mg Oral Daily   methylPREDNISolone (SOLU-MEDROL) injection  60 mg Intravenous Q12H   mirtazapine  7.5 mg Oral QHS   multivitamin with minerals  1 tablet Oral Daily   pantoprazole  40 mg Oral Daily   Continuous Infusions:  thiamine injection 500 mg (04/08/21 1631)   PRN Meds: ondansetron  Labs Reviewed  NUTRITION - FOCUSED PHYSICAL EXAM: Defer to in-person assessment  Diet Order:   Diet Order             Diet regular Room service appropriate? Yes; Fluid consistency:  Thin  Diet effective now                   EDUCATION NEEDS:  Not appropriate for education at this time  Skin:  Skin Assessment: Reviewed RN Assessment  Last BM:  10/1 - type 4  Height:  Ht Readings from Last 1 Encounters:  04/07/21 5\' 4"  (1.626 m)    Weight:  Wt Readings from Last 1 Encounters:  04/07/21 40.8 kg    Ideal Body Weight:  54.5 kg  BMI:  Body mass index is 15.45 kg/m.  Estimated Nutritional Needs:  Kcal:  1500-1700 kcal/d Protein:  75-85 g/d Fluid:  1.5-1.8L/d   Ranell Patrick, RD, LDN Clinical Dietitian Pager on [place]

## 2021-04-08 NOTE — Progress Notes (Signed)
PROGRESS NOTE    Andrea Larson  VOJ:[id number] DOB: [birth year] DOA: 04/07/2021 PCP: Sandrea Hughs, NP (Confirm with patient/family/NH records and if not entered, this HAS to be entered at [facility] point of entry. "No PCP" if truly none.)   Chief Complaint  Patient presents with   Altered Mental Status    Brief Narrative:  Patient 75 year old female history of hypertension, COPD, former heavy alcohol use, dementia, several month history of weight loss, adult failure to thrive presented to the ED with a 1 month history of intermittent confusion, occasional hallucinations and noted to have some slurred speech prompting family to bring her to the ED.  MRI brain done negative for any acute abnormalities.  Chest x-ray negative for any acute infiltrate.  Urinalysis nitrite negative leukocytes negative.  Patient noted to have a blood glucose level of 58.  Patient admitted for further evaluation and management.   Assessment & Plan:   Principal Problem:   Dehydration Active Problems:   COPD (chronic obstructive pulmonary disease) (HCC)   GERD (gastroesophageal reflux disease)   Essential hypertension   Dementia without behavioral disturbance (HCC)   Depression with anxiety   Acute metabolic encephalopathy   COPD with acute exacerbation (HCC)  #1 dehydration with mild hypoglycemia -Improved with D5 half-normal saline. -Discontinue IV fluids and monitor CBGs. -Supportive care.  2.  Acute metabolic encephalopathy -Patient noted to have been having some bouts of intermittent hallucinations, and confusion. -Likely multifactorial secondary to dehydration, mild hypoglycemia, mild COPD exacerbation. -Improving clinically. -Continue high-dose IV thiamine. -Saline lock IV fluids. -Placed on IV steroid taper, Pulmicort, Xopenex and Atrovent nebs, Flonase, Claritin, PPI, Mucinex. -Supportive care.  3.  Mild acute COPD exacerbation -On examination patient with poor air movement, tightness,  some expiratory wheezing. -Placed on IV Solu-Medrol, Pulmicort, Xopenex and Atrovent nebs, Flonase, Claritin, PPI, Mucinex. -Supportive care.  4.  Dementia -MRI brain negative for any acute abnormalities. -Folate at 19.2, vitamin B12 at 313. -Resume home regimen Aricept, Namenda.  5.  Hypertension -BP stable.  51.  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 -Placed on Ativan withdrawal protocol. -Continue high-dose IV thiamine, folic acid, multivitamin.  7.  Low vitamin B12 levels -Vitamin B12 1000 MCG's subcu daily x1 week, and then weekly x1 month, and then monthly. -Could potentially discharge home on oral vitamin B12 supplementation.  8.  Hyperlipidemia -Hold statin and resume on discharge.  9.  Depression/anxiety Resume home regimen Wellbutrin, Prozac.   DVT prophylaxis: Heparin Code Status: Full Family Communication: Updated patient, son at bedside Disposition:   Status is: Inpatient.  The patient will require care spanning > 2 midnights and should be moved to inpatient because: Inpatient level of care appropriate due to severity of illness  Dispo: The patient is from: Home              Anticipated d/c is to: Home              Patient currently is not medically stable to d/c.   Difficult to place patient No       Consultants:  None  Procedures:  Chest x-ray 04/07/2021 MRI brain 04/08/2021   Antimicrobials:  None   Subjective: Laying in bed.  Overall feeling better than on admission.  Denies any visual or auditory hallucinations.  Alert to self place and time.  Denies any chest pain.  No change in chronic shortness of breath per patient.  Family at bedside.  Low blood glucose levels improved on D5 half-normal saline.  Objective: Vitals:   04/08/21 0548 04/08/21 1123 04/08/21 1238 04/08/21 1423  BP: (!) 143/84 130/65  128/77  Pulse: 87 92  100  Resp: 14 18  18    Temp: 98.9 F (37.2 C) 98.4 F (36.9 C)  98.3 F (36.8 C)  TempSrc: Oral Oral  Oral  SpO2: 98% 98% 95% 97%  Weight:      Height:        Intake/Output Summary (Last 24 hours) at 04/08/2021 1720 Last data filed at 04/08/2021 1000 Gross per 24 hour  Intake 1459.06 ml  Output --  Net 1459.06 ml   Filed Weights   04/07/21 1231 04/07/21 1541  Weight: 40.8 kg 40.8 kg    Examination:  General exam: NAD Respiratory system: Poor air movement.  Some diffuse expiratory wheezing.  No crackles.  Slight use of accessory muscles of respiration.  Cardiovascular system: Regular rate rhythm no murmurs rubs or gallops.  No JVD.  No lower extremity edema. Gastrointestinal system: Abdomen is soft, nontender, nondistended, positive bowel sounds.  No rebound.  No guarding. Central nervous system: Alert and oriented. No focal neurological deficits. Extremities: Symmetric 5 x 5 power. Skin: No rashes, lesions or ulcers Psychiatry: Judgement and insight appear normal. Mood & affect appropriate.     Data Reviewed: I have personally reviewed following labs and imaging studies  CBC: Recent Labs  Lab 04/07/21 1323 04/08/21 0616  WBC 6.4 9.2  NEUTROABS 4.8  --   HGB 15.6* 10.6*  HCT 50.1* 33.7*  MCV 94.0 95.2  PLT 199 267    Basic Metabolic Panel: Recent Labs  Lab 04/07/21 1323 04/08/21 0616  NA 147* 141  K 3.9 3.8  CL 115* 107  CO2 26 29  GLUCOSE 67* 82  BUN 14 11  CREATININE 0.83 0.76  CALCIUM 8.8* 8.8*  MG 1.9  --   PHOS 2.7  --     GFR: Estimated Creatinine Clearance: 37.9 mL/min (by C-G formula based on SCr of 0.76 mg/dL).  Liver Function Tests: Recent Labs  Lab 04/07/21 1323  AST 29  ALT 43  ALKPHOS 68  BILITOT 0.5  PROT 6.8  ALBUMIN 3.1*    CBG: Recent Labs  Lab 04/08/21 0103 04/08/21 0423 04/08/21 0751 04/08/21 1213 04/08/21 1628  GLUCAP 99 83 70 98 133*     Recent Results (from the past 240 hour(s))  Urine Culture     Status: None   Collection  Time: 04/03/21  3:42 PM   Specimen: Urine  Result Value Ref Range Status   MICRO NUMBER: [id number]  Final   SPECIMEN QUALITY: Adequate  Final   Sample Source URINE  Final   STATUS: FINAL  Final   Result:   Final    Mixed genital flora isolated. These superficial bacteria are not indicative of a urinary tract infection. No further organism identification is warranted on this specimen. If clinically indicated, recollect clean-catch, mid-stream urine and transfer  immediately to Urine Culture Transport Tube.   Resp Panel by RT-PCR (Flu A&B, Covid) Nasopharyngeal Swab     Status: None   Collection Time: 04/07/21  1:23 PM   Specimen: Nasopharyngeal Swab; Nasopharyngeal(NP) swabs in vial transport medium  Result Value Ref Range Status   SARS Coronavirus 2 by RT PCR NEGATIVE NEGATIVE Final    Comment: (NOTE) SARS-CoV-2 target nucleic acids are NOT DETECTED.  The SARS-CoV-2 RNA is generally detectable in upper respiratory specimens  during the acute phase of infection. The lowest concentration of SARS-CoV-2 viral copies this assay can detect is 138 copies/mL. A negative result does not preclude SARS-Cov-2 infection and should not be used as the sole basis for treatment or other patient management decisions. A negative result may occur with  improper specimen collection/handling, submission of specimen other than nasopharyngeal swab, presence of viral mutation(s) within the areas targeted by this assay, and inadequate number of viral copies(<138 copies/mL). A negative result must be combined with clinical observations, patient history, and epidemiological information. The expected result is Negative.  Fact Sheet for Patients:  EntrepreneurPulse.com.au  Fact Sheet for Healthcare Providers:  IncredibleEmployment.be  This test is no t yet approved or cleared by the [place] FDA and  has been authorized for detection and/or diagnosis of SARS-CoV-2  by FDA under an Emergency Use Authorization (EUA). This EUA will remain  in effect (meaning this test can be used) for the duration of the COVID-19 declaration under Section 564(b)(1) of the Act, 21 U.S.C.section 360bbb-3(b)(1), unless the authorization is terminated  or revoked sooner.       Influenza A by PCR NEGATIVE NEGATIVE Final   Influenza B by PCR NEGATIVE NEGATIVE Final    Comment: (NOTE) The Xpert Xpress SARS-CoV-2/FLU/RSV plus assay is intended as an aid in the diagnosis of influenza from Nasopharyngeal swab specimens and should not be used as a sole basis for treatment. Nasal washings and aspirates are unacceptable for Xpert Xpress SARS-CoV-2/FLU/RSV testing.  Fact Sheet for Patients: EntrepreneurPulse.com.au  Fact Sheet for Healthcare Providers: IncredibleEmployment.be  This test is not yet approved or cleared by the [place] FDA and has been authorized for detection and/or diagnosis of SARS-CoV-2 by FDA under an Emergency Use Authorization (EUA). This EUA will remain in effect (meaning this test can be used) for the duration of the COVID-19 declaration under Section 564(b)(1) of the Act, 21 U.S.C. section 360bbb-3(b)(1), unless the authorization is terminated or revoked.  Performed at [facility], [place] [address], [place], [place] [address]   Blood Culture (routine x 2)     Status: None (Preliminary result)   Collection Time: 04/07/21  1:23 PM   Specimen: BLOOD  Result Value Ref Range Status   Specimen Description   Final    BLOOD RIGHT ANTECUBITAL Performed at [place] [address], [place], [place] [address]    Special Requests   Final    BOTTLES DRAWN AEROBIC AND ANAEROBIC Blood Culture results may not be optimal due to an inadequate volume of blood received in culture bottles Performed at [place] [address], [place], [place] [address]     Culture   Final    NO GROWTH < 12 HOURS Performed at [facility] [place] [address], [place], [place] [address]    Report Status PENDING  Incomplete  Blood Culture (routine x 2)     Status: None (Preliminary result)   Collection Time: 04/07/21 10:01 PM   Specimen: BLOOD  Result Value Ref Range Status   Specimen Description   Final    BLOOD RIGHT ANTECUBITAL Performed at [place] [address] [place], [place] [address]    Special Requests   Final    BOTTLES DRAWN AEROBIC AND ANAEROBIC Blood Culture adequate volume Performed at [facility] [address] [place], [place] [address]    Culture   Final    NO GROWTH < 12 HOURS Performed at  [facility], [place] [address] [place], [place] [address]    Report Status PENDING  Incomplete         Radiology Studies: CT Head Wo Contrast  Result Date: 04/07/2021 CLINICAL DATA:  Altered mental status Dementia Possible UTI EXAM: CT HEAD WITHOUT CONTRAST TECHNIQUE: Contiguous axial images were obtained from the base of the skull through the vertex without intravenous contrast. COMPARISON:  CT head 02/26/2021 FINDINGS: Brain: No evidence of acute infarction, hemorrhage, hydrocephalus, extra-axial collection or mass lesion/mass effect. Vascular: No hyperdense vessel or unexpected calcification. Skull: Normal. Negative for fracture or focal lesion. Sinuses/Orbits: Partial opacification of the left sphenoid sinus. Other: None. IMPRESSION: No acute intracranial abnormality. Electronically Signed   By: Miachel Roux M.D.   On: 04/07/2021 16:21   MR BRAIN WO CONTRAST  Result Date: 04/08/2021 CLINICAL DATA:  Encephalopathy EXAM: MRI HEAD WITHOUT CONTRAST TECHNIQUE: Multiplanar, multiecho pulse sequences of the brain and surrounding structures were obtained without intravenous contrast. COMPARISON:  06/04/2020 FINDINGS: Brain: No acute infarct, mass effect or extra-axial collection. Focus of chronic  microhemorrhage right frontal lobe. No acute hemorrhage. There is multifocal hyperintense T2-weighted signal within the white matter. Generalized volume loss without a clear lobar predilection. The midline structures are normal. Vascular: Major flow voids are preserved. Skull and upper cervical spine: Normal calvarium and skull base. Visualized upper cervical spine and soft tissues are normal. Sinuses/Orbits:No paranasal sinus fluid levels or advanced mucosal thickening. No mastoid or middle ear effusion. Normal orbits. IMPRESSION: 1. No acute intracranial abnormality. 2. Generalized volume loss and findings of chronic small vessel disease. Electronically Signed   By: Ulyses Jarred M.D.   On: 04/08/2021 00:42   DG Chest Port 1 View  Result Date: 04/07/2021 CLINICAL DATA:  Questionable sepsis - evaluate for abnormality EXAM: PORTABLE CHEST 1 VIEW COMPARISON:  02/26/2021. FINDINGS: The heart size and mediastinal contours are within normal limits. Both lungs are clear. No visible pleural effusions or pneumothorax. Old rib fractures better seen on the prior. IMPRESSION: No acute cardiopulmonary disease. Electronically Signed   By: Margaretha Sheffield M.D.   On: 04/07/2021 13:27        Scheduled Meds:  budesonide (PULMICORT) nebulizer solution  0.5 mg Nebulization BID   buPROPion  150 mg Oral Daily   cyanocobalamin  1,000 mcg Subcutaneous Daily   donepezil  5 mg Oral QHS   FLUoxetine  40 mg Oral Daily   fluticasone  2 spray Each Nare Daily   folic acid  1 mg Oral Daily   guaiFENesin  1,200 mg Oral BID   heparin injection (subcutaneous)  5,000 Units Subcutaneous Q12H   ipratropium  0.5 mg Nebulization Q6H   levalbuterol  0.63 mg Nebulization Q6H   loratadine  10 mg Oral Daily   megestrol  40 mg Oral Daily   memantine  10 mg Oral BID   methylPREDNISolone (SOLU-MEDROL) injection  60 mg Intravenous Q12H   mirtazapine  7.5 mg Oral QHS   multivitamin with minerals  1 tablet Oral Daily   pantoprazole   40 mg Oral Daily   Continuous Infusions:  thiamine injection 500 mg (04/08/21 1631)     LOS: 0 days    Time spent: 40 minutes    Irine Seal, MD [facility]   To contact the attending provider between 7A-7P or the covering provider during after hours 7P-7A, please log into the web site [email or web address] and access using universal [place] password for that web site. If you do not  have the password, please call the hospital operator.  04/08/2021, 5:20 PM

## 2021-04-08 NOTE — Evaluation (Signed)
Physical Therapy Evaluation Patient Details Name: Andrea Larson MRN: [id number] DOB: [birth year] Today's Date: 04/08/2021  History of Present Illness  Pt is 74 yo female who presented on 04/07/21 with slurred speech and found to have dehydration and mild hypoglycemia (MRI negative for CVA).  Pt with several month hx of wt loss and FTT with possible UTIs.  She has hx of HTN, COPD, former heavy EtOH use, and dementia.  Clinical Impression  Pt admitted with above diagnosis. At baseline, pt resides with son and when son works has Psychologist, sport and exercise.  Pt has 24 hr care.  She has had a decline in function, eating, weight loss, and cognition over past several months per son.  She ambulates with or without RW but has had frequent falls.  Today, pt very anxious and reports fatigue that limited session.  She did ambulate 20' and transfer with min A and cues. Will benefit from acute PT to advance as able.  Pt currently with functional limitations due to the deficits listed below (see PT Problem List). Pt will benefit from skilled PT to increase their independence and safety with mobility to allow discharge to the venue listed below.          Recommendations for follow up therapy are one component of a multi-disciplinary discharge planning process, led by the attending physician.  Recommendations may be updated based on patient status, additional functional criteria and insurance authorization.  Follow Up Recommendations Home health PT;Supervision/Assistance - 24 hour    Equipment Recommendations  None recommended by PT    Recommendations for Other Services       Precautions / Restrictions Precautions Precautions: Fall      Mobility  Bed Mobility Overal bed mobility: Needs Assistance Bed Mobility: Supine to Sit;Sit to Supine     Supine to sit: Supervision Sit to supine: Supervision        Transfers Overall transfer level: Needs assistance Equipment used: Rolling walker (2  wheeled) Transfers: Sit to/from Stand Sit to Stand: Min guard         General transfer comment: min guard for safety and cues for hand placement  Ambulation/Gait Ambulation/Gait assistance: Min assist Gait Distance (Feet): 20 Feet Assistive device: Rolling walker (2 wheeled) Gait Pattern/deviations: Step-to pattern;Decreased stride length;Shuffle Gait velocity: decreased   General Gait Details: Pt reports fatigue and did not want to ambulate far, provided min A to stabilize and assist with walker with turns.  Cues to take her time and for RW use  Stairs            Wheelchair Mobility    Modified Rankin (Stroke Patients Only)       Balance Overall balance assessment: Needs assistance Sitting-balance support: No upper extremity supported Sitting balance-Leahy Scale: Fair     Standing balance support: Bilateral upper extremity supported Standing balance-Leahy Scale: Poor Standing balance comment: requiring UE support and min A at times                             Pertinent Vitals/Pain Pain Assessment: No/denies pain    Home Living Family/patient expects to be discharged to:: Private residence Living Arrangements: Children (son lives with her) Available Help at Discharge: Family;Personal care attendant;Available 24 hours/day (Has personal care aides when son works) Type of Home: House Home Access: Stairs to enter Entrance Stairs-Rails: Right Entrance Stairs-Number of Steps: [place]: One level Home Equipment: Environmental consultant - 2 wheels;Cane - single point;Wheelchair -  manual Additional Comments: Son has camera set up with alarm to let them know when pt gets out of bed    Prior Function Level of Independence: Needs assistance   Gait / Transfers Assistance Needed: Pt reports typically ambulates without AD but occasionally uses - spoke with son who reports that there is really no rhyme or reason as to when she uses and when she doesn't  ADL's /  Homemaking Assistance Needed: Pt reports could perform dressing and toileting I'ly but has supervision for safety with showers  Comments: Pt denied falls but son reports frequent falls; wears O2 at night; also son reports they have a therapist coming in to work with pt     Hand Dominance        Extremity/Trunk Assessment   Upper Extremity Assessment Upper Extremity Assessment:  (Overall ROM WFL and MMT 4/5)    Lower Extremity Assessment Lower Extremity Assessment:  (Overall ROM WFL and MMT 4+/5)    Cervical / Trunk Assessment Cervical / Trunk Assessment: Normal  Communication   Communication: No difficulties  Cognition Arousal/Alertness: Awake/alert Behavior During Therapy: Anxious;Restless Overall Cognitive Status: History of cognitive impairments - at baseline                                 General Comments: Pt with hx of dementia but overall followed commands and provided mostly correct hx (with some errors).  She was very anxious.  Even laying flat in bed pt with tense and aggravated with lines/leads      General Comments General comments (skin integrity, edema, etc.): Spoke with son Gwyndolyn Saxon on phone who reports pt has 24 hr care and they want her to return home if posible. Expressed concern about her not eatng and loosing weight the past few months.    Exercises     Assessment/Plan    PT Assessment Patient needs continued PT services  PT Problem List Decreased strength;Decreased mobility;Decreased safety awareness;Decreased activity tolerance;Decreased cognition;Decreased balance;Decreased knowledge of use of DME       PT Treatment Interventions DME instruction;Therapeutic activities;Cognitive remediation;Gait training;Therapeutic exercise;Patient/family education;Stair training;Functional mobility training;Balance training    PT Goals (Current goals can be found in the Care Plan section)  Acute Rehab PT Goals Patient Stated Goal: return home; son  reports he promised to keep her home PT Goal Formulation: With patient/family Time For Goal Achievement: 04/22/21 Potential to Achieve Goals: Fair    Frequency Min 3X/week   Barriers to discharge        Co-evaluation               AM-PAC PT "6 Clicks" Mobility  Outcome Measure Help needed turning from your back to your side while in a flat bed without using bedrails?: A Little Help needed moving from lying on your back to sitting on the side of a flat bed without using bedrails?: A Little Help needed moving to and from a bed to a chair (including a wheelchair)?: A Little Help needed standing up from a chair using your arms (e.g., wheelchair or bedside chair)?: A Little Help needed to walk in hospital room?: A Little Help needed climbing 3-5 steps with a railing? : A Little 6 Click Score: 18    End of Session Equipment Utilized During Treatment: Gait belt Activity Tolerance: Patient tolerated treatment well Patient left: in bed;with call bell/phone within reach;with bed alarm set Nurse Communication: Mobility status PT Visit Diagnosis: Unsteadiness  on feet (R26.81);History of falling (Z91.81);Muscle weakness (generalized) (M62.81)    Time: 3912-2583 PT Time Calculation (min) (ACUTE ONLY): 17 min   Charges:   PT Evaluation $PT Eval Low Complexity: 1 Low          Gaelyn Tukes, PT Acute Rehab Services Pager [phone] [facility] [place] [facility] 04/08/2021, 4:42 PM

## 2021-04-09 ENCOUNTER — Inpatient Hospital Stay (HOSPITAL_COMMUNITY): Payer: Medicare HMO

## 2021-04-09 DIAGNOSIS — R451 Restlessness and agitation: Secondary | ICD-10-CM

## 2021-04-09 LAB — BASIC METABOLIC PANEL
Anion gap: 8 (ref 5–15)
BUN: 15 mg/dL (ref 8–23)
CO2: 23 mmol/L (ref 22–32)
Calcium: 9 mg/dL (ref 8.9–10.3)
Chloride: 115 mmol/L — ABNORMAL HIGH (ref 98–111)
Creatinine, Ser: 1.1 mg/dL — ABNORMAL HIGH (ref 0.44–1.00)
GFR, Estimated: 52 mL/min — ABNORMAL LOW (ref >60)
Glucose, Bld: 169 mg/dL — ABNORMAL HIGH (ref 70–99)
Potassium: 4.4 mmol/L (ref 3.5–5.1)
Sodium: 146 mmol/L — ABNORMAL HIGH (ref 135–145)

## 2021-04-09 LAB — BLOOD GAS, ARTERIAL
Acid-base deficit: 5.2 mmol/L — ABNORMAL HIGH (ref 0.0–2.0)
Bicarbonate: 21.2 mmol/L (ref 20.0–28.0)
FIO2: 28
O2 Saturation: 96.8 %
Patient temperature: 97.9
pCO2 arterial: 47.2 mmHg (ref 32.0–48.0)
pH, Arterial: 7.272 — ABNORMAL LOW (ref 7.350–7.450)
pO2, Arterial: 99.4 mmHg (ref 83.0–108.0)

## 2021-04-09 LAB — CBC
HCT: 33.2 % — ABNORMAL LOW (ref 36.0–46.0)
Hemoglobin: 10.4 g/dL — ABNORMAL LOW (ref 12.0–15.0)
MCH: 29.6 pg (ref 26.0–34.0)
MCHC: 31.3 g/dL (ref 30.0–36.0)
MCV: 94.6 fL (ref 80.0–100.0)
Platelets: 324 10*3/uL (ref 150–400)
RBC: 3.51 MIL/uL — ABNORMAL LOW (ref 3.87–5.11)
RDW: 14.1 % (ref 11.5–15.5)
WBC: 6.5 10*3/uL (ref 4.0–10.5)
nRBC: 0 % (ref 0.0–0.2)

## 2021-04-09 LAB — GLUCOSE, CAPILLARY
Glucose-Capillary: 113 mg/dL — ABNORMAL HIGH (ref 70–99)
Glucose-Capillary: 114 mg/dL — ABNORMAL HIGH (ref 70–99)
Glucose-Capillary: 231 mg/dL — ABNORMAL HIGH (ref 70–99)
Glucose-Capillary: 248 mg/dL — ABNORMAL HIGH (ref 70–99)

## 2021-04-09 LAB — MAGNESIUM: Magnesium: 2.1 mg/dL (ref 1.7–2.4)

## 2021-04-09 LAB — URINE CULTURE: Culture: NO GROWTH

## 2021-04-09 MED ORDER — PREDNISONE 20 MG PO TABS
40.0000 mg | ORAL_TABLET | Freq: Every day | ORAL | Status: DC
  Administered 2021-04-10 – 2021-04-11 (×2): 40 mg via ORAL
  Filled 2021-04-09 (×2): qty 2

## 2021-04-09 MED ORDER — CLONAZEPAM 0.5 MG PO TABS
0.5000 mg | ORAL_TABLET | Freq: Two times a day (BID) | ORAL | Status: DC | PRN
  Administered 2021-04-09: 0.5 mg via ORAL
  Filled 2021-04-09: qty 1

## 2021-04-09 MED ORDER — LEVALBUTEROL HCL 0.63 MG/3ML IN NEBU
0.6300 mg | INHALATION_SOLUTION | Freq: Three times a day (TID) | RESPIRATORY_TRACT | Status: DC | PRN
  Administered 2021-04-09: 0.63 mg via RESPIRATORY_TRACT
  Filled 2021-04-09: qty 3

## 2021-04-09 MED ORDER — HALOPERIDOL LACTATE 5 MG/ML IJ SOLN
2.0000 mg | Freq: Once | INTRAMUSCULAR | Status: AC
  Administered 2021-04-09: 2 mg via INTRAVENOUS
  Filled 2021-04-09: qty 1

## 2021-04-09 MED ORDER — SODIUM CHLORIDE 0.45 % IV SOLN: INTRAVENOUS | Status: DC

## 2021-04-09 MED ORDER — LEVALBUTEROL HCL 0.63 MG/3ML IN NEBU: 0.6300 mg | INHALATION_SOLUTION | Freq: Three times a day (TID) | RESPIRATORY_TRACT | Status: DC

## 2021-04-09 MED ORDER — IPRATROPIUM BROMIDE 0.02 % IN SOLN
0.5000 mg | Freq: Three times a day (TID) | RESPIRATORY_TRACT | Status: DC
  Administered 2021-04-09 – 2021-04-11 (×8): 0.5 mg via RESPIRATORY_TRACT
  Filled 2021-04-09 (×8): qty 2.5

## 2021-04-09 MED ORDER — HYDROXYZINE HCL 50 MG/ML IM SOLN
25.0000 mg | Freq: Four times a day (QID) | INTRAMUSCULAR | Status: DC | PRN
  Administered 2021-04-09: 25 mg via INTRAMUSCULAR
  Filled 2021-04-09 (×2): qty 0.5

## 2021-04-09 MED ORDER — QUETIAPINE FUMARATE 25 MG PO TABS: 25.0000 mg | ORAL_TABLET | Freq: Every evening | ORAL | Status: DC | PRN

## 2021-04-09 MED ORDER — QUETIAPINE FUMARATE 25 MG PO TABS
25.0000 mg | ORAL_TABLET | Freq: Every day | ORAL | Status: DC
  Administered 2021-04-09 – 2021-04-10 (×2): 25 mg via ORAL
  Filled 2021-04-09 (×2): qty 1

## 2021-04-09 NOTE — Progress Notes (Signed)
OT Cancellation Note  Patient Details Name: Andrea Larson MRN: [id number] DOB: [birth year]   Cancelled Treatment:    Reason Eval/Treat Not Completed: Other (comment) Patient sleeping when checked this afternoon. Nursing staff asking for patient to rest at this time. Will check back tomorrow.  Jackelyn Poling OTR/L, [place] Acute Rehabilitation Department Office# [phone] Pager# [phone]   04/09/2021, 3:00 PM

## 2021-04-09 NOTE — NC FL2 (Signed)
[place] LEVEL OF CARE SCREENING TOOL     IDENTIFICATION  Patient Name: Andrea Larson Birthdate: 20-Sep-1945 Sex: female Admission Date (Current Location): 04/07/2021  [facility] and [place] Number:  Herbalist and Address:  [facility],  [place] at [place], [place]      Provider Number: [id number]  Attending Physician Name and Address:  Eugenie Filler, MD  Relative Name and Phone Number:       Current Level of Care: Hospital Recommended Level of Care: [place] Prior Approval Number:    Date Approved/Denied:   PASRR Number: [id number]  Discharge Plan: SNF    Current Diagnoses: Patient Active Problem List   Diagnosis Date Noted   Acute metabolic encephalopathy 22/63/3354   COPD with acute exacerbation ([place]) 04/08/2021   Dehydration 04/07/2021   Aortic atherosclerosis ([place]) 04/04/2021   Depression with anxiety    Confusion 12/17/2020   History of colon polyps 10/17/2020   Vitamin D deficiency 10/17/2020   Dementia without behavioral disturbance ([place]) 08/09/2020   Gait abnormality 08/09/2020   Chronic hypoxemic respiratory failure ([place]) 04/10/2020   Dementia ([place]) 06/24/2018   Memory loss 06/24/2018   Weight loss 06/24/2018   Peripheral edema 05/21/2018   Gait disorder 05/21/2018   Diarrhea 04/28/2018   CKD (chronic kidney disease), stage III (HCC) 04/28/2018   Malnutrition of moderate degree 04/13/2018   Essential hypertension 04/11/2018   Gallstones 04/11/2018   Hypokalemia 03/21/2018   UTI (urinary tract infection) 02/19/2018   Coronary artery calcification 08/23/2016   Aortic stenosis 08/23/2016   Chest pain 07/20/2016   Hyperglycemia 07/20/2016   Insomnia 08/18/2015   GERD (gastroesophageal reflux disease) 08/18/2015   Acute bronchitis 02/15/2015   Back pain 10/28/2012   Asthma 09/21/2011   Allergic rhinitis 09/21/2011   Colon polyps 09/21/2011   Fatigue 09/21/2011   Encounter for  well adult exam with abnormal findings 09/15/2011   DEGENERATIVE JOINT DISEASE, CERVICAL SPINE 10/21/2008   Overweight(278.02) 02/10/2008   ANXIETY DEPRESSION 02/10/2008   COPD (chronic obstructive pulmonary disease) ([place]) 02/10/2008   EMPHYSEMA NEC 01/30/2007   Hyperlipidemia 12/31/2006    Orientation RESPIRATION BLADDER Height & Weight     Self, Time, Situation, Place  Normal Continent Weight: 40.8 kg Height:  5\' 4"  (162.6 cm)  BEHAVIORAL SYMPTOMS/MOOD NEUROLOGICAL BOWEL NUTRITION STATUS      Continent Diet (regular)  AMBULATORY STATUS COMMUNICATION OF NEEDS Skin   Extensive Assist Verbally Normal                       Personal Care Assistance Level of Assistance  Bathing, Feeding, Dressing Bathing Assistance: Limited assistance Feeding assistance: Limited assistance Dressing Assistance: Limited assistance     Functional Limitations Info  Sight, Hearing, Speech Sight Info: Adequate Hearing Info: Adequate Speech Info: Adequate    SPECIAL CARE FACTORS FREQUENCY  PT (By licensed PT), OT (By licensed OT)     PT Frequency: 5 x weekly OT Frequency: 5 x weekly            Contractures Contractures Info: Not present    Additional Factors Info  Code Status Code Status Info: full             Current Medications (04/09/2021):  This is the current hospital active medication list Current Facility-Administered Medications  Medication Dose Route Frequency Provider Last Rate Last Admin   acetaminophen (TYLENOL) tablet 650 mg  650 mg Oral Q6H PRN Etta Quill, DO  650 mg at 04/08/21 2055   Or   acetaminophen (TYLENOL) suppository 650 mg  650 mg Rectal Q6H PRN Etta Quill, DO       budesonide (PULMICORT) nebulizer solution 0.5 mg  0.5 mg Nebulization BID Eugenie Filler, MD   0.5 mg at 04/09/21 3419   buPROPion (WELLBUTRIN XL) 24 hr tablet 150 mg  150 mg Oral Daily Eugenie Filler, MD   150 mg at 04/08/21 2056   clonazePAM (KLONOPIN) tablet 0.5 mg   0.5 mg Oral BID PRN Eugenie Filler, MD   0.5 mg at 04/09/21 3790   cyanocobalamin ((VITAMIN B-12)) injection 1,000 mcg  1,000 mcg Subcutaneous Daily Eugenie Filler, MD   1,000 mcg at 04/08/21 1628   donepezil (ARICEPT) tablet 5 mg  5 mg Oral QHS Eugenie Filler, MD   5 mg at 04/08/21 2059   feeding supplement (ENSURE ENLIVE / ENSURE PLUS) liquid 237 mL  237 mL Oral TID BM Eugenie Filler, MD       FLUoxetine (PROZAC) capsule 40 mg  40 mg Oral Daily Eugenie Filler, MD   40 mg at 04/08/21 2056   fluticasone (FLONASE) 50 MCG/ACT nasal spray 2 spray  2 spray Each Nare Daily Eugenie Filler, MD   2 spray at 24/09/73 5329   folic acid (FOLVITE) tablet 1 mg  1 mg Oral Daily Jennette Kettle M, DO   1 mg at 04/08/21 1020   guaiFENesin (MUCINEX) 12 hr tablet 1,200 mg  1,200 mg Oral BID Eugenie Filler, MD   1,200 mg at 04/08/21 2054   heparin injection 5,000 Units  5,000 Units Subcutaneous Q12H Etta Quill, DO   5,000 Units at 04/08/21 2055   hydrOXYzine (VISTARIL) injection 25 mg  25 mg Intramuscular Q6H PRN Lovey Newcomer T, NP   25 mg at 04/09/21 0413   ipratropium (ATROVENT) nebulizer solution 0.5 mg  0.5 mg Nebulization TID Eugenie Filler, MD   0.5 mg at 04/09/21 9242   levalbuterol (XOPENEX) nebulizer solution 0.63 mg  0.63 mg Nebulization Q8H PRN Lovey Newcomer T, NP   0.63 mg at 04/09/21 0417   loratadine (CLARITIN) tablet 10 mg  10 mg Oral Daily Eugenie Filler, MD   10 mg at 04/08/21 1245   megestrol (MEGACE) tablet 40 mg  40 mg Oral Daily Eugenie Filler, MD   40 mg at 04/08/21 2057   memantine (NAMENDA) tablet 10 mg  10 mg Oral BID Eugenie Filler, MD   10 mg at 04/08/21 2057   mirtazapine (REMERON) tablet 7.5 mg  7.5 mg Oral QHS Eugenie Filler, MD   7.5 mg at 04/08/21 2054   multivitamin with minerals tablet 1 tablet  1 tablet Oral Daily Jennette Kettle M, DO   1 tablet at 04/08/21 1020   ondansetron (ZOFRAN) tablet 4 mg  4 mg Oral Q6H PRN Etta Quill, DO       Or   ondansetron [facility]) injection 4 mg  4 mg Intravenous Q6H PRN Etta Quill, DO       pantoprazole (PROTONIX) EC tablet 40 mg  40 mg Oral Daily Eugenie Filler, MD   40 mg at 04/08/21 1629   predniSONE (DELTASONE) tablet 40 mg  40 mg Oral QAC breakfast Eugenie Filler, MD       thiamine 500mg  in normal saline (52ml) IVPB  500 mg Intravenous Q8H Jennette Kettle M, DO 100 mL/hr at 04/09/21  0500 500 mg at 04/09/21 0500     Discharge Medications: Please see discharge summary for a list of discharge medications.  Relevant Imaging Results:  Relevant Lab Results:   Additional Information [id number]  Leeroy Cha, RN

## 2021-04-09 NOTE — Progress Notes (Signed)
Rapid Response Event Note   Reason for Call : confusion and agitation    Initial Focused Assessment: pt alert, able to answer simple questions/commands.  Pt denies pain.  Very agitated and anxious. Lung sounds/Wheezes. Temp 97.9 A, 96%RA, HR 119, RR 27, B/P 134/86 (100)  Interventions: TRIAD, NP made aware new orders received and initiated.  Breathing treatment given, CBG, ABG, PCXR.   Plan of Care: Pt will remain in current location, pt anxiety seems somewhat better. TRIAD, NP made aware of follow-up results.  Event Summary:   MD Notified: yes  Call Time: 0324 Arrival Time: 4446 End Time: 0435  Dyann Ruddle, RN

## 2021-04-09 NOTE — TOC Initial Note (Addendum)
Transition of Care [facility]) - Initial/Assessment Note    Patient Details  Name: Andrea Larson MRN: [id number] Date of Birth: 1946/04/21  [facility]) CM/SW Contact:    Leeroy Cha, RN Phone Number: 04/09/2021, 8:41 AM  Clinical Narrative:                 Searching for [facility]-pt ot and aide-[facility] unable to do for 13-14 days, sent to [facility]. Unable to take. [facility] brittany orders sent via text.-unable to take Resent to [place] with [facility] for review again. [facility]- Ceheryl rose unable to take [facility]nidie Sillmon-tct-message left on voice mail to call back. Sin now wants snf placement feel that he can not handle patient at home due to risk of falls. Fl2 sent out to area snf's [facility] number is [id number] SSN: [phone] Expected Discharge Plan: [place] Barriers to Discharge: Barriers Resolved   Patient Goals and CMS Choice Patient states their goals for this hospitalization and ongoing recovery are:: to go home CMS Medicare.gov Compare Post Acute Care list provided to:: Patient Choice offered to / list presented to : Patient  Expected Discharge Plan and Services Expected Discharge Plan: [place]   Discharge Planning Services: CM Consult Post Acute Care Choice: [place] arrangements for the past 2 months: Single Family Home                           HH Arranged: PT, OT, Nurse's Aide          Prior Living Arrangements/Services Living arrangements for the past 2 months: Single Family Home Lives with:: Self Patient language and need for interpreter reviewed:: Yes Do you feel safe going back to the place where you live?: Yes            Criminal Activity/Legal Involvement Pertinent to Current Situation/Hospitalization: No - Comment as needed  Activities of Daily Living Home Assistive Devices/Equipment: None ADL Screening (condition at time of admission) Patient's cognitive  ability adequate to safely complete daily activities?: No Is the patient deaf or have difficulty hearing?: Yes Does the patient have difficulty seeing, even when wearing glasses/contacts?: No Does the patient have difficulty concentrating, remembering, or making decisions?: Yes Patient able to express need for assistance with ADLs?: Yes Does the patient have difficulty dressing or bathing?: No Independently performs ADLs?: Yes (appropriate for developmental age) Does the patient have difficulty walking or climbing stairs?: No Weakness of Legs: Both Weakness of Arms/Hands: None  Permission Sought/Granted                  Emotional Assessment Appearance:: Appears stated age     Orientation: : Oriented to Self, Oriented to Place, Oriented to  Time, Oriented to Situation Alcohol / Substance Use: Not Applicable Psych Involvement: No (comment)  Admission diagnosis:  Dehydration [E86.0] Encephalopathy [G93.40] Patient Active Problem List   Diagnosis Date Noted   Acute metabolic encephalopathy 52/77/8242   COPD with acute exacerbation ([place]) 04/08/2021   Dehydration 04/07/2021   Aortic atherosclerosis ([place]) 04/04/2021   Depression with anxiety    Confusion 12/17/2020   History of colon polyps 10/17/2020   Vitamin D deficiency 10/17/2020   Dementia without behavioral disturbance ([place]) 08/09/2020   Gait abnormality 08/09/2020   Chronic hypoxemic respiratory failure ([place]) 04/10/2020   Dementia ([place]) 06/24/2018   Memory loss 06/24/2018   Weight loss 06/24/2018   Peripheral edema 05/21/2018   Gait disorder 05/21/2018  Diarrhea 04/28/2018   CKD (chronic kidney disease), stage III ([place]) 04/28/2018   Malnutrition of moderate degree 04/13/2018   Essential hypertension 04/11/2018   Gallstones 04/11/2018   Hypokalemia 03/21/2018   UTI (urinary tract infection) 02/19/2018   Coronary artery calcification 08/23/2016   Aortic stenosis 08/23/2016   Chest pain 07/20/2016    Hyperglycemia 07/20/2016   Insomnia 08/18/2015   GERD (gastroesophageal reflux disease) 08/18/2015   Acute bronchitis 02/15/2015   Back pain 10/28/2012   Asthma 09/21/2011   Allergic rhinitis 09/21/2011   Colon polyps 09/21/2011   Fatigue 09/21/2011   Encounter for well adult exam with abnormal findings 09/15/2011   DEGENERATIVE JOINT DISEASE, CERVICAL SPINE 10/21/2008   Overweight(278.02) 02/10/2008   ANXIETY DEPRESSION 02/10/2008   COPD (chronic obstructive pulmonary disease) ([place]) 02/10/2008   EMPHYSEMA NEC 01/30/2007   Hyperlipidemia 12/31/2006   PCP:  Sandrea Hughs, NP Pharmacy:   [place], [place] [address] Phone: [phone] Fax: [place] [address] Phone: [phone] Fax: [phone]     Social Determinants of Health (SDOH) Interventions    Readmission Risk Interventions No flowsheet data found.

## 2021-04-09 NOTE — Progress Notes (Signed)
Patient still confused and agitated with tremors. Dr Grandville Silos made aware and ordered a dose Klonopin. Patient then calmed down and went to sleep shortly after.

## 2021-04-09 NOTE — Progress Notes (Signed)
   04/09/21 0323  Assess: MEWS Score  Temp 98.1 F (36.7 C)  BP (!) 169/62 (Pt. is very anxious and will not lay still)  Pulse Rate (!) 120  Resp (!) 28 (irregular d/t constant movement)  SpO2 100 %  O2 Device Nasal Cannula  O2 Flow Rate (L/min) 2 L/min  Assess: MEWS Score  MEWS Temp 0  MEWS Systolic 0  MEWS Pulse 2  MEWS RR 2  MEWS LOC 0  MEWS Score 4  MEWS Score Color Red  Assess: if the MEWS score is Yellow or Red  Were vital signs taken at a resting state? Yes  Focused Assessment Change from prior assessment (see assessment flowsheet)  Does the patient meet 2 or more of the SIRS criteria? No  MEWS guidelines implemented *See Row Information* Yes  Treat  MEWS Interventions Administered prn meds/treatments;Administered scheduled meds/treatments  Take Vital Signs  Increase Vital Sign Frequency  Red: Q 1hr X 4 then Q 4hr X 4, if remains red, continue Q 4hrs  Escalate  MEWS: Escalate Red: discuss with charge nurse/RN and provider, consider discussing with RRT  Notify: Charge Nurse/RN  Name of Charge Nurse/RN Notified [employer]  Date Charge Nurse/RN Notified 04/08/21  Time Charge Nurse/RN Notified 0320  Notify: Provider  Provider Name/Title [place],NP  Date Provider Notified 04/09/21  Time Provider Notified 0330  Notification Type Page  Notification Reason Change in status  Provider response No new orders  Date of Provider Response 04/09/21  Time of Provider Response 0333  Notify: Rapid Response  Name of Rapid Response RN Notified Signa Kell  Date Rapid Response Notified 04/09/21  Time Rapid Response Notified 6644  Document  Patient Outcome Other (Comment) (still very anxious, getting evaluated by RR RN)  Progress note created (see row info) Yes  Assess: SIRS CRITERIA  SIRS Temperature  0  SIRS Pulse 1  SIRS Respirations  1  SIRS WBC 0  SIRS Score Sum  2

## 2021-04-09 NOTE — Progress Notes (Signed)
Patient has slept all day, arouses but does not want to take any PO medications. MD aware, will continue to monitor.

## 2021-04-09 NOTE — Progress Notes (Addendum)
PROGRESS NOTE    Andrea Larson  ZOX:[id number] DOB: 16-Dec-1945 DOA: 04/07/2021 PCP: Sandrea Hughs, NP   Chief Complaint  Patient presents with   Altered Mental Status    Brief Narrative:  Patient 75 year old female history of hypertension, COPD, former heavy alcohol use, dementia, several month history of weight loss, adult failure to thrive presented to the ED with a 1 month history of intermittent confusion, occasional hallucinations and noted to have some slurred speech prompting family to bring her to the ED.  MRI brain done negative for any acute abnormalities.  Chest x-ray negative for any acute infiltrate.  Urinalysis nitrite negative leukocytes negative.  Patient noted to have a blood glucose level of 58.  Patient admitted for further evaluation and management.   Assessment & Plan:   Principal Problem:   Dehydration Active Problems:   COPD (chronic obstructive pulmonary disease) (HCC)   GERD (gastroesophageal reflux disease)   Essential hypertension   Dementia without behavioral disturbance (HCC)   Depression with anxiety   Acute metabolic encephalopathy   COPD with acute exacerbation (HCC)  #1 dehydration with mild hypoglycemia -Improved with D5 half-normal saline. -Place on half-normal saline at 75 cc an hour for the next 24 hours. -CBG 113 this morning.  -Supportive care.   2.  Acute metabolic encephalopathy -Patient noted to have been having some bouts of intermittent hallucinations, and confusion prior to admission. -Likely multifactorial secondary to dehydration, mild hypoglycemia, mild COPD exacerbation, possible component of sundowning.. -Patient noted to be agitated overnight currently sleeping peacefully.  -Continue high-dose IV thiamine.. -Place on IV steroid taper, Pulmicort, Xopenex and Atrovent nebs, Flonase, Claritin, PPI, Mucinex. -Transition from IV Solu-Medrol to oral prednisone taper. -Seroquel nightly. -Supportive care.  3.  Mild acute  COPD exacerbation -On examination on 04/08/2021, patient with poor air movement, tightness, some expiratory wheezing. -Some clinical improvement on IV Solu-Medrol, Pulmicort, Xopenex, Atrovent nebs, Flonase, Claritin, PPI, Mucinex.   -We will transition from IV Solu-Medrol to quick prednisone taper.   -Supportive care.    4.  Dementia/FTT -Patient noted to be agitated overnight.  May have a component of sundowning. -MRI brain negative for any acute abnormalities. -Folate at 19.2, vitamin B12 at 313. -Continue Aricept, Namenda.   -Start Seroquel 25 mg nightly to see whether that helps with agitation overnight. -Per family patient was significantly poor oral intake and recent weight loss. -Patient noted to have some good oral intake yesterday per RN. -Consulted palliative care for goals of care.  5.  Hypertension -BP stable.  32.  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 -Continue Ativan withdrawal protocol. -Continue high-dose IV thiamine, folic acid, multivitamin.  7.  Low vitamin B12 levels -Vitamin B12 1000 MCG's subcu daily x1 week, and then weekly x1 month, and then monthly. -Could potentially discharge home on oral vitamin B12 supplementation.  8.  Hyperlipidemia -Continue to hold statin and could resume on discharge.  9.  Depression/anxiety Continue home regimen Wellbutrin, Prozac.  Klonopin as needed.    DVT prophylaxis: Heparin Code Status: Full Family Communication: Updated son and daughter-in-law at bedside.  Disposition:   Status is: Inpatient.  The patient will require care spanning > 2 midnights and should be moved to inpatient because: Inpatient level of care appropriate due to severity of illness  Dispo: The patient is from: Home              Anticipated d/c  is to: Home              Patient currently is not medically stable to d/c.   Difficult to place patient  No       Consultants:  None  Procedures:  Chest x-ray 04/07/2021 MRI brain 04/08/2021   Antimicrobials:  None   Subjective: Sleeping peacefully after getting Klonopin this morning.  Patient noted to have some agitation overnight per RN.  Family at bedside and do not feel they are able to manage patient at home at this time and requesting possible placement.  Objective: Vitals:   04/09/21 0644 04/09/21 0804 04/09/21 0812 04/09/21 1103  BP: 132/83 (!) 146/89  (!) 140/101  Pulse: (!) 111 (!) 114  (!) 109  Resp: (!) 26 18    Temp: 98.3 F (36.8 C) 98.6 F (37 C)    TempSrc: Oral Oral    SpO2: 98% 96% 95% 100%  Weight:      Height:        Intake/Output Summary (Last 24 hours) at 04/09/2021 1202 Last data filed at 04/09/2021 5638 Gross per 24 hour  Intake 350 ml  Output --  Net 350 ml    Filed Weights   04/07/21 1231 04/07/21 1541  Weight: 40.8 kg 40.8 kg    Examination:  General exam: NAD. Respiratory system: CTA B anterior lung fields.  No significant wheezes.  No crackles.  Fair air movement.   Cardiovascular system: RRR no murmurs rubs or gallops.  No JVD.  No lower extremity edema.  Gastrointestinal system: Abdomen is soft, nontender, nondistended, positive bowel sounds.  No rebound.  No guarding.  Central nervous system: Alert and oriented. No focal neurological deficits. Extremities: Symmetric 5 x 5 power. Skin: No rashes, lesions or ulcers Psychiatry: Judgement and insight appear normal. Mood & affect appropriate.     Data Reviewed: I have personally reviewed following labs and imaging studies  CBC: Recent Labs  Lab 04/07/21 1323 04/08/21 0616 04/09/21 0601  WBC 6.4 9.2 6.5  NEUTROABS 4.8  --   --   HGB 15.6* 10.6* 10.4*  HCT 50.1* 33.7* 33.2*  MCV 94.0 95.2 94.6  PLT 199 298 324     Basic Metabolic Panel: Recent Labs  Lab 04/07/21 1323 04/08/21 0616 04/09/21 0601  NA 147* 141 146*  K 3.9 3.8 4.4  CL 115* 107 115*  CO2 26 29 23    GLUCOSE 67* 82 169*  BUN 14 11 15   CREATININE 0.83 0.76 1.10*  CALCIUM 8.8* 8.8* 9.0  MG 1.9  --  2.1  PHOS 2.7  --   --      GFR: Estimated Creatinine Clearance: 27.6 mL/min (A) (by C-G formula based on SCr of 1.1 mg/dL (H)).  Liver Function Tests: Recent Labs  Lab 04/07/21 1323  AST 29  ALT 43  ALKPHOS 68  BILITOT 0.5  PROT 6.8  ALBUMIN 3.1*     CBG: Recent Labs  Lab 04/08/21 2028 04/09/21 0026 04/09/21 0415 04/09/21 0806 04/09/21 1153  GLUCAP 235* 231* 248* 113* 114*      Recent Results (from the past 240 hour(s))  Urine Culture     Status: None   Collection Time: 04/03/21  3:42 PM   Specimen: Urine  Result Value Ref Range Status   MICRO NUMBER: [id number]  Final   SPECIMEN QUALITY: Adequate  Final   Sample Source URINE  Final   STATUS: FINAL  Final   Result:   Final    Mixed  genital flora isolated. These superficial bacteria are not indicative of a urinary tract infection. No further organism identification is warranted on this specimen. If clinically indicated, recollect clean-catch, mid-stream urine and transfer  immediately to Urine Culture Transport Tube.   Resp Panel by RT-PCR (Flu A&B, Covid) Nasopharyngeal Swab     Status: None   Collection Time: 04/07/21  1:23 PM   Specimen: Nasopharyngeal Swab; Nasopharyngeal(NP) swabs in vial transport medium  Result Value Ref Range Status   SARS Coronavirus 2 by RT PCR NEGATIVE NEGATIVE Final    Comment: (NOTE) SARS-CoV-2 target nucleic acids are NOT DETECTED.  The SARS-CoV-2 RNA is generally detectable in upper respiratory specimens during the acute phase of infection. The lowest concentration of SARS-CoV-2 viral copies this assay can detect is 138 copies/mL. A negative result does not preclude SARS-Cov-2 infection and should not be used as the sole basis for treatment or other patient management decisions. A negative result may occur with  improper specimen collection/handling, submission of specimen  other than nasopharyngeal swab, presence of viral mutation(s) within the areas targeted by this assay, and inadequate number of viral copies(<138 copies/mL). A negative result must be combined with clinical observations, patient history, and epidemiological information. The expected result is Negative.  Fact Sheet for Patients:  EntrepreneurPulse.com.au  Fact Sheet for Healthcare Providers:  IncredibleEmployment.be  This test is no t yet approved or cleared by the [place] FDA and  has been authorized for detection and/or diagnosis of SARS-CoV-2 by FDA under an Emergency Use Authorization (EUA). This EUA will remain  in effect (meaning this test can be used) for the duration of the COVID-19 declaration under Section 564(b)(1) of the Act, 21 U.S.C.section 360bbb-3(b)(1), unless the authorization is terminated  or revoked sooner.       Influenza A by PCR NEGATIVE NEGATIVE Final   Influenza B by PCR NEGATIVE NEGATIVE Final    Comment: (NOTE) The Xpert Xpress SARS-CoV-2/FLU/RSV plus assay is intended as an aid in the diagnosis of influenza from Nasopharyngeal swab specimens and should not be used as a sole basis for treatment. Nasal washings and aspirates are unacceptable for Xpert Xpress SARS-CoV-2/FLU/RSV testing.  Fact Sheet for Patients: EntrepreneurPulse.com.au  Fact Sheet for Healthcare Providers: IncredibleEmployment.be  This test is not yet approved or cleared by the [place] FDA and has been authorized for detection and/or diagnosis of SARS-CoV-2 by FDA under an Emergency Use Authorization (EUA). This EUA will remain in effect (meaning this test can be used) for the duration of the COVID-19 declaration under Section 564(b)(1) of the Act, 21 U.S.C. section 360bbb-3(b)(1), unless the authorization is terminated or revoked.  Performed at [facility], [place] [address] [place], [place] [address]   Blood Culture (routine x 2)     Status: None (Preliminary result)   Collection Time: 04/07/21  1:23 PM   Specimen: BLOOD  Result Value Ref Range Status   Specimen Description   Final    BLOOD RIGHT ANTECUBITAL Performed at [facility] [address] [place], [place] [address]    Special Requests   Final    BOTTLES DRAWN AEROBIC AND ANAEROBIC Blood Culture results may not be optimal due to an inadequate volume of blood received in culture bottles Performed at [place] [address], [place], [place] [address]    Culture   Final    NO GROWTH 2 DAYS Performed at [facility] [address], [place], [place] [address]  Report Status PENDING  Incomplete  Urine Culture     Status: None   Collection Time: 04/07/21  1:23 PM   Specimen: Urine, Clean Catch  Result Value Ref Range Status   Specimen Description   Final    URINE, CLEAN CATCH Performed at [facility], [place] [address], [place], [place] [address]    Special Requests   Final    NONE Performed at [facility], [place] [address] [place], [place] [address]    Culture   Final    NO GROWTH Performed at [facility], [place] [address], [place], [place] [address]    Report Status 04/09/2021 FINAL  Final  Blood Culture (routine x 2)     Status: None (Preliminary result)   Collection Time: 04/07/21 10:01 PM   Specimen: BLOOD  Result Value Ref Range Status   Specimen Description   Final    BLOOD RIGHT ANTECUBITAL Performed at [facility] [address], [place], [place] [address]    Special Requests   Final    BOTTLES DRAWN AEROBIC AND ANAEROBIC Blood Culture adequate volume Performed at [facility] [address], [place], [place] [address]    Culture   Final    NO GROWTH 2 DAYS Performed at [facility] [address] [place], [place] [address]     Report Status PENDING  Incomplete          Radiology Studies: DG Chest 1 View  Result Date: 04/09/2021 CLINICAL DATA:  Shortness of breath.  History of asthma. EXAM: CHEST  1 VIEW COMPARISON:  April 07, 2021 FINDINGS: The heart size and mediastinal contours are within normal limits. Both lungs are clear. The visualized skeletal structures are unremarkable. IMPRESSION: No active disease. Electronically Signed   By: Dorise Bullion III M.D.   On: 04/09/2021 05:05   CT Head Wo Contrast  Result Date: 04/07/2021 CLINICAL DATA:  Altered mental status Dementia Possible UTI EXAM: CT HEAD WITHOUT CONTRAST TECHNIQUE: Contiguous axial images were obtained from the base of the skull through the vertex without intravenous contrast. COMPARISON:  CT head 02/26/2021 FINDINGS: Brain: No evidence of acute infarction, hemorrhage, hydrocephalus, extra-axial collection or mass lesion/mass effect. Vascular: No hyperdense vessel or unexpected calcification. Skull: Normal. Negative for fracture or focal lesion. Sinuses/Orbits: Partial opacification of the left sphenoid sinus. Other: None. IMPRESSION: No acute intracranial abnormality. Electronically Signed   By: Miachel Roux M.D.   On: 04/07/2021 16:21   MR BRAIN WO CONTRAST  Result Date: 04/08/2021 CLINICAL DATA:  Encephalopathy EXAM: MRI HEAD WITHOUT CONTRAST TECHNIQUE: Multiplanar, multiecho pulse sequences of the brain and surrounding structures were obtained without intravenous contrast. COMPARISON:  06/04/2020 FINDINGS: Brain: No acute infarct, mass effect or extra-axial collection. Focus of chronic microhemorrhage right frontal lobe. No acute hemorrhage. There is multifocal hyperintense T2-weighted signal within the white matter. Generalized volume loss without a clear lobar predilection. The midline structures are normal. Vascular: Major flow voids are preserved. Skull and upper cervical spine: Normal calvarium and skull base. Visualized upper cervical spine  and soft tissues are normal. Sinuses/Orbits:No paranasal sinus fluid levels or advanced mucosal thickening. No mastoid or middle ear effusion. Normal orbits. IMPRESSION: 1. No acute intracranial abnormality. 2. Generalized volume loss and findings of chronic small vessel disease. Electronically Signed   By: Ulyses Jarred M.D.   On: 04/08/2021 00:42   DG Chest Port 1 View  Result Date: 04/07/2021 CLINICAL DATA:  Questionable  sepsis - evaluate for abnormality EXAM: PORTABLE CHEST 1 VIEW COMPARISON:  02/26/2021. FINDINGS: The heart size and mediastinal contours are within normal limits. Both lungs are clear. No visible pleural effusions or pneumothorax. Old rib fractures better seen on the prior. IMPRESSION: No acute cardiopulmonary disease. Electronically Signed   By: Margaretha Sheffield M.D.   On: 04/07/2021 13:27        Scheduled Meds:  budesonide (PULMICORT) nebulizer solution  0.5 mg Nebulization BID   buPROPion  150 mg Oral Daily   cyanocobalamin  1,000 mcg Subcutaneous Daily   donepezil  5 mg Oral QHS   feeding supplement  237 mL Oral TID BM   FLUoxetine  40 mg Oral Daily   fluticasone  2 spray Each Nare Daily   folic acid  1 mg Oral Daily   guaiFENesin  1,200 mg Oral BID   heparin injection (subcutaneous)  5,000 Units Subcutaneous Q12H   ipratropium  0.5 mg Nebulization TID   loratadine  10 mg Oral Daily   megestrol  40 mg Oral Daily   memantine  10 mg Oral BID   mirtazapine  7.5 mg Oral QHS   multivitamin with minerals  1 tablet Oral Daily   pantoprazole  40 mg Oral Daily   predniSONE  40 mg Oral QAC breakfast   Continuous Infusions:  thiamine injection 500 mg (04/09/21 0500)     LOS: 1 day    Time spent: 40 minutes    Irine Seal, MD [facility]   To contact the attending provider between 7A-7P or the covering provider during after hours 7P-7A, please log into the web site [email or web address] and access using universal [employer] password for that web site. If  you do not have the password, please call the hospital operator.  04/09/2021, 12:02 PM

## 2021-04-09 NOTE — Progress Notes (Signed)
OT Cancellation Note  Patient Details Name: GISEL VIPOND MRN: [id number] DOB: May 17, 1946   Cancelled Treatment:    Reason Eval/Treat Not Completed: Other (comment) patient's nurse requested patient be left sleeping with patient having had rough night last night. Will continue to follow and check back as schedule allows.   Jackelyn Poling OTR/L, [place] Acute Rehabilitation Department Office# [phone] Pager# [phone]   04/09/2021, 1:07 PM

## 2021-04-09 NOTE — Progress Notes (Signed)
NP on call, and rapid response RN were notified last night of patient's Red MEWS score. Patient was very confused and agitated. She had increased tremors and was becoming short of breath. Patient was given ativan, which appeared to make her worse.  We also tried haldol, which did not help. Patient's agitation slightly decreased after the IM atarax, but still it persisted. Day RN and family updated on patient condition.Roderick Pee

## 2021-04-10 DIAGNOSIS — E43 Unspecified severe protein-calorie malnutrition: Secondary | ICD-10-CM | POA: Insufficient documentation

## 2021-04-10 LAB — CBC WITH DIFFERENTIAL/PLATELET
Abs Immature Granulocytes: 0.02 10*3/uL (ref 0.00–0.07)
Basophils Absolute: 0 10*3/uL (ref 0.0–0.1)
Basophils Relative: 0 %
Eosinophils Absolute: 0 10*3/uL (ref 0.0–0.5)
Eosinophils Relative: 0 %
HCT: 32.7 % — ABNORMAL LOW (ref 36.0–46.0)
Hemoglobin: 10.2 g/dL — ABNORMAL LOW (ref 12.0–15.0)
Immature Granulocytes: 0 %
Lymphocytes Relative: 23 %
Lymphs Abs: 2.1 10*3/uL (ref 0.7–4.0)
MCH: 29.9 pg (ref 26.0–34.0)
MCHC: 31.2 g/dL (ref 30.0–36.0)
MCV: 95.9 fL (ref 80.0–100.0)
Monocytes Absolute: 0.7 10*3/uL (ref 0.1–1.0)
Monocytes Relative: 8 %
Neutro Abs: 6.2 10*3/uL (ref 1.7–7.7)
Neutrophils Relative %: 69 %
Platelets: 242 10*3/uL (ref 150–400)
RBC: 3.41 MIL/uL — ABNORMAL LOW (ref 3.87–5.11)
RDW: 14.6 % (ref 11.5–15.5)
WBC: 9 10*3/uL (ref 4.0–10.5)
nRBC: 0 % (ref 0.0–0.2)

## 2021-04-10 LAB — GLUCOSE, CAPILLARY
Glucose-Capillary: 69 mg/dL — ABNORMAL LOW (ref 70–99)
Glucose-Capillary: 76 mg/dL (ref 70–99)
Glucose-Capillary: 81 mg/dL (ref 70–99)
Glucose-Capillary: 98 mg/dL (ref 70–99)
Glucose-Capillary: 186 mg/dL — ABNORMAL HIGH (ref 70–99)

## 2021-04-10 LAB — BASIC METABOLIC PANEL
Anion gap: 5 (ref 5–15)
BUN: 15 mg/dL (ref 8–23)
CO2: 29 mmol/L (ref 22–32)
Calcium: 8.5 mg/dL — ABNORMAL LOW (ref 8.9–10.3)
Chloride: 113 mmol/L — ABNORMAL HIGH (ref 98–111)
Creatinine, Ser: 0.89 mg/dL (ref 0.44–1.00)
GFR, Estimated: >60 mL/min (ref >60)
Glucose, Bld: 74 mg/dL (ref 70–99)
Potassium: 4.7 mmol/L (ref 3.5–5.1)
Sodium: 147 mmol/L — ABNORMAL HIGH (ref 135–145)

## 2021-04-10 MED ORDER — DEXTROSE 5 % IV SOLN: INTRAVENOUS | Status: DC

## 2021-04-10 MED ORDER — LEVALBUTEROL HCL 0.63 MG/3ML IN NEBU
0.6300 mg | INHALATION_SOLUTION | Freq: Three times a day (TID) | RESPIRATORY_TRACT | Status: DC
  Administered 2021-04-10 – 2021-04-11 (×3): 0.63 mg via RESPIRATORY_TRACT
  Filled 2021-04-10 (×3): qty 3

## 2021-04-10 NOTE — Evaluation (Signed)
Occupational Therapy Evaluation Patient Details Name: Andrea Larson MRN: [id number] DOB: 08-08-1945 Today's Date: 04/10/2021   History of Present Illness Pt is 75 yo female who presented on 04/07/21 with slurred speech and found to have dehydration and mild hypoglycemia (MRI negative for CVA).  Pt with several month hx of wt loss and FTT with possible UTIs.  She has hx of HTN, COPD, former heavy EtOH use, and dementia.   Clinical Impression   Patient is a 75 year old female who was admitted for above. Patient was noted to have increased lethargic on this date.  Patient was noted to have decreased activity tolerance, decreased endurance, decreased standing balance and decreased participation in ADLs impacting ADLs.patient's family is present awaiting palliative consult to make decisions on patients care at this time.  Patient would continue to benefit from skilled OT services at this time while admitted and after d/c to address noted deficits in order to improve overall safety and independence in ADLs.       Recommendations for follow up therapy are one component of a multi-disciplinary discharge planning process, led by the attending physician.  Recommendations may be updated based on patient status, additional functional criteria and insurance authorization.   Follow Up Recommendations  Supervision/Assistance - 24 hour;Home health OT    Equipment Recommendations       Recommendations for Other Services       Precautions / Restrictions Precautions Precautions: Fall Precaution Comments: 2L/min at home Restrictions Weight Bearing Restrictions: No      Mobility Bed Mobility Overal bed mobility: Needs Assistance Bed Mobility: Supine to Sit;Sit to Supine     Supine to sit: Supervision     General bed mobility comments: with increased time and encouragement to participate in task    Transfers Overall transfer level: Needs assistance Equipment used: Rolling walker (2  wheeled) Transfers: Sit to/from Stand Sit to Stand: Min guard         General transfer comment: patient was min guard for functional mobility with patient's son present to encourage patient to participate.    Balance Overall balance assessment: Needs assistance Sitting-balance support: No upper extremity supported Sitting balance-Leahy Scale: Fair     Standing balance support: Bilateral upper extremity supported Standing balance-Leahy Scale: Fair                             ADL either performed or assessed with clinical judgement   ADL Overall ADL's : Needs assistance/impaired Eating/Feeding: Set up;Sitting;Supervision/ safety   Grooming: Wash/dry face;Wash/dry hands;Set up;Sitting   Upper Body Bathing: Minimal assistance;Sitting Upper Body Bathing Details (indicate cue type and reason): patient declined to participate in this task Lower Body Bathing: Moderate assistance;Sitting/lateral leans Lower Body Bathing Details (indicate cue type and reason): patient declined to participate in this task Upper Body Dressing : Minimal assistance;Sitting   Lower Body Dressing: Moderate assistance;Sit to/from stand   Toilet Transfer: Minimal assistance;BSC;Ambulation;RW   Toileting- Clothing Manipulation and Hygiene: Moderate assistance;Sit to/from stand       Functional mobility during ADLs: Minimal assistance;Rolling walker General ADL Comments: patient declined to participate in ADLs. patients son was able to encourage patient to participate in functional mobility to increase activity tolerance for about 50 feet on this date with encouragement to keep going. patietns O2 saturation maintained above 92% on2L/min with mobility.     Vision   Additional Comments: hard to assess with patients increased lethargicness no obvious visual deficits  during session     Perception     Praxis      Pertinent Vitals/Pain Pain Assessment: No/denies pain     Hand Dominance      Extremity/Trunk Assessment Upper Extremity Assessment Upper Extremity Assessment: Overall WFL for tasks assessed   Lower Extremity Assessment Lower Extremity Assessment: Defer to PT evaluation   Cervical / Trunk Assessment Cervical / Trunk Assessment: Normal   Communication Communication Communication: No difficulties   Cognition Arousal/Alertness: Awake/alert Behavior During Therapy: Flat affect Overall Cognitive Status: History of cognitive impairments - at baseline                                 General Comments: patient was able to follow commands but was noted to have increased lethargicness with eyes closed between questions and patiet falling back asleep at times during session   General Comments       Exercises     Shoulder Instructions      Home Living Family/patient expects to be discharged to:: Private residence Living Arrangements: Children Available Help at Discharge: Family;Personal care attendant;Available 24 hours/day Type of Home: House Home Access: Stairs to enter [employer] of Steps: 2 Entrance Stairs-Rails: Right Home Layout: One level               Home Equipment: Walker - 2 wheels;Cane - single point;Wheelchair - manual   Additional Comments: Son has camera set up with alarm to let them know when pt gets out of bed      Prior Functioning/Environment Level of Independence: Needs assistance  Gait / Transfers Assistance Needed: Pt reports typically ambulates without AD but occasionally uses - spoke with son who reports that there is really no rhyme or reason as to when she uses and when she doesn't ADL's / Homemaking Assistance Needed: Pt reports could perform dressing and toileting I'ly but has supervision for safety with showers   Comments: Pt denied falls but son reports frequent falls; wears O2 at night; also son reports they have a therapist coming in to work with pt        OT Problem List: Decreased  activity tolerance;Impaired balance (sitting and/or standing);Decreased safety awareness      OT Treatment/Interventions: Self-care/ADL training;DME and/or AE instruction;Therapeutic activities;Balance training;Patient/family education    OT Goals(Current goals can be found in the care plan section) Acute Rehab OT Goals Patient Stated Goal: to go back home OT Goal Formulation: With patient Time For Goal Achievement: 04/24/21 Potential to Achieve Goals: Fair  OT Frequency: Min 2X/week   Barriers to D/C:            Co-evaluation              AM-PAC OT "6 Clicks" Daily Activity     Outcome Measure Help from another person eating meals?: A Little Help from another person taking care of personal grooming?: A Little Help from another person toileting, which includes using toliet, bedpan, or urinal?: A Little Help from another person bathing (including washing, rinsing, drying)?: A Lot Help from another person to put on and taking off regular upper body clothing?: A Little Help from another person to put on and taking off regular lower body clothing?: A Lot 6 Click Score: 16   End of Session Equipment Utilized During Treatment: Gait belt;Rolling walker Nurse Communication: Other (comment) (nurse present for mobility)  Activity Tolerance: Patient tolerated treatment well Patient left: in chair;with  call bell/phone within reach;with family/visitor present  OT Visit Diagnosis: Unsteadiness on feet (R26.81);Muscle weakness (generalized) (M62.81)                Time: 1607-3710 OT Time Calculation (min): 27 min Charges:  OT General Charges $OT Visit: 1 Visit OT Evaluation $OT Eval Low Complexity: 1 Low OT Treatments $Self Care/Home Management : 8-22 mins  Jackelyn Poling OTR/L, [place] Acute Rehabilitation Department Office# [phone] Pager# [phone]   [place] 04/10/2021, 12:31 PM

## 2021-04-10 NOTE — Progress Notes (Signed)
Hypoglycemic Event  CBG: 69 mg/dL   Treatment: 8 oz juice/soda  Symptoms: None  Follow-up CBG: [id number] CBG Result:76 MG/Dl  Possible Reasons for Event: Inadequate meal intake  Comments/MD notified:[place] NP    Tad Moore

## 2021-04-10 NOTE — Progress Notes (Addendum)
Family at bedside requesting RN. Pt's sons stating that during lunch pt was drinking her Ensure and was coughing significantly sounding like "it went down the wrong pipe" Son who lives with pt full time states that "she has been doing this more lately and always tries to clear her throat after eating/drinking, and it never sounds like it clears"  Pt's pulse ox 100% on 2L [place].  RN to place order for speech therapy evaluation.

## 2021-04-10 NOTE — Progress Notes (Signed)
Physical Therapy Treatment Patient Details Name: Andrea Larson MRN: [id number] DOB: March 03, 1946 Today's Date: 04/10/2021   History of Present Illness Pt is 75 yo female who presented on 04/07/21 with slurred speech and found to have dehydration and mild hypoglycemia (MRI negative for CVA).  Pt with several month hx of wt loss and FTT with possible UTIs.  She has hx of HTN, COPD, former heavy EtOH use, and dementia.    PT Comments    Pt refused OOB or ambulation stating she would not get up bc OT walked her earlier.  With encouragement pt was agreeable to exercise in bed. Reviewed proper breathing/postural exercises. Pt was able to assist with scooting up in bed in supine position.    Recommendations for follow up therapy are one component of a multi-disciplinary discharge planning process, led by the attending physician.  Recommendations may be updated based on patient status, additional functional criteria and insurance authorization.  Follow Up Recommendations  Home health PT;Supervision/Assistance - 24 hour     Equipment Recommendations  None recommended by PT    Recommendations for Other Services       Precautions / Restrictions Precautions Precautions: Fall Precaution Comments: 2L/min at home Restrictions Weight Bearing Restrictions: No     Mobility  Bed Mobility Overal bed mobility: Needs Assistance Bed Mobility: Supine to Sit;Sit to Supine     Supine to sit: Supervision     General bed mobility comments: pt refused d/t having walked with OT    Transfers Overall transfer level: Needs assistance Equipment used: Rolling walker (2 wheeled) Transfers: Sit to/from Stand Sit to Stand: Min guard         General transfer comment: refused  Ambulation/Gait             General Gait Details: refused stating she walked in  hallway with OT   Stairs             Wheelchair Mobility    Modified Rankin (Stroke Patients Only)       Balance Overall  balance assessment: Needs assistance Sitting-balance support: No upper extremity supported Sitting balance-Leahy Scale: Fair     Standing balance support: Bilateral upper extremity supported Standing balance-Leahy Scale: Fair                              Cognition Arousal/Alertness: Awake/alert Behavior During Therapy: Flat affect Overall Cognitive Status: History of cognitive impairments - at baseline                                 General Comments: pt appropriate, followed commands consistently during PT session      Exercises General Exercises - Lower Extremity Ankle Circles/Pumps: AROM;15 reps;Both Quad Sets: AROM;Both;10 reps Heel Slides: AROM;Strengthening;Both;10 reps;Other (comment) (with manual resistance) Hip ABduction/ADduction: AROM;Both;10 reps;Other (comment) (manual resistance) Low Level/ICU Exercises Stabilized Bridging: AROM;Strengthening;Both;10 reps;Limitations Stabilized Bridging Limitations: rest break after 5 reps    General Comments        Pertinent Vitals/Pain Pain Assessment: No/denies pain    Home Living Family/patient expects to be discharged to:: Private residence Living Arrangements: Children Available Help at Discharge: Family;Personal care attendant;Available 24 hours/day Type of Home: House Home Access: Stairs to enter Entrance Stairs-Rails: Right Home Layout: One level Home Equipment: Walker - 2 wheels;Cane - single point;Wheelchair - manual Additional Comments: Son has camera set up with alarm to let them  know when pt gets out of bed    Prior Function Level of Independence: Needs assistance  Gait / Transfers Assistance Needed: Pt reports typically ambulates without AD but occasionally uses - spoke with son who reports that there is really no rhyme or reason as to when she uses and when she doesn't ADL's / Homemaking Assistance Needed: Pt reports could perform dressing and toileting I'ly but has supervision  for safety with showers Comments: Pt denied falls but son reports frequent falls; wears O2 at night; also son reports they have a therapist coming in to work with pt   PT Goals (current goals can now be found in the care plan section) Acute Rehab PT Goals Patient Stated Goal: to go back home PT Goal Formulation: With patient/family Time For Goal Achievement: 04/22/21 Potential to Achieve Goals: Fair Progress towards PT goals: Progressing toward goals    Frequency    Min 3X/week      PT Plan Current plan remains appropriate    Co-evaluation              AM-PAC PT "6 Clicks" Mobility   Outcome Measure  Help needed turning from your back to your side while in a flat bed without using bedrails?: A Little Help needed moving from lying on your back to sitting on the side of a flat bed without using bedrails?: A Little Help needed moving to and from a bed to a chair (including a wheelchair)?: A Little Help needed standing up from a chair using your arms (e.g., wheelchair or bedside chair)?: A Little Help needed to walk in hospital room?: A Little Help needed climbing 3-5 steps with a railing? : A Little 6 Click Score: 18    End of Session Equipment Utilized During Treatment: Gait belt Activity Tolerance: Patient tolerated treatment well Patient left: in bed;with call bell/phone within reach;with chair alarm set;with family/visitor present Nurse Communication: Mobility status PT Visit Diagnosis: Unsteadiness on feet (R26.81);History of falling (Z91.81);Muscle weakness (generalized) (M62.81)     Time: 8101-7510 PT Time Calculation (min) (ACUTE ONLY): 21 min  Charges:  $Therapeutic Exercise: 8-22 mins                     Baxter Flattery, PT  Acute Rehab Dept [facility]) [phone] Pager [phone]  04/10/2021    [facility] 04/10/2021, 3:38 PM

## 2021-04-10 NOTE — Progress Notes (Signed)
Nutrition Follow-up  DOCUMENTATION CODES:   Severe malnutrition in context of chronic illness, Underweight  INTERVENTION:   Encourage good PO intake  Continue Ensure Enlive (chocolate) po TID, each supplement provides 350 kcal and 20 grams of protein  Continue Magic Cup (chocolate) TID with meals, each supplement provides 290 kcal and 9 grams of protein   NUTRITION DIAGNOSIS:   Severe Malnutrition related to chronic illness (COPD, dementia) as evidenced by severe muscle depletion, severe fat depletion. - Ongoing  GOAL:   Patient will meet greater than or equal to 90% of their needs - Ongoing  MONITOR:   PO intake, Supplement acceptance, Weight trends, Labs  REASON FOR ASSESSMENT:   Consult Assessment of nutrition requirement/status  ASSESSMENT:   75 year old female with hx of dementia, CKD3, COPD, HTN, HLD, depression, hx of EtOH abuse, and GERD present to ED with AMS and concerns for possible UTI.  Pt resting in chair at time of visit; pt son and daughter-in-law at bedside and able to provide nutrition related information. Per family pt has home health nurse with her everyday and they keep a log of meal intakes, pt's youngest son also stays with her at night. Per family, pt typical intake includes; Breakfast: egg, toast, grits, cantaloupe; Lunch: sandwich, grilled cheese; Supper: frozen meals, meatloaf, salisbury steak and sips on Coke all day. Family reports giving a protein shake from [facility] daily (~500 kcal each), [facility] nurse say she only drinks a full one about every other day.  Family reports that pt will eat inconsistently, she will eat well and then hardly eat for a few days.   Per EMR, pt intake includes: 9/30: Dinner 100% 10/1: Breakfast 100%  Per family, pt weighed ~100# about a year ago and now is steady around 87#. Per family, [place] nurses weight pt everyday and the highest she has weighted recently was 91#. Per EMR, pt has had 13% weight loss in 1 year, this is not  clinically significant for the time frame and amount. Family reports that she does not get around much, mainly walks a short distance from the bed to the bathroom with a walker.   Family reports that she recently switched PCP last week and switch to remeron because previous medicine was an appetite depressant.   Family reports that pt had C. Diff back in 2019 and ever since then her appetite and intake has ben significantly low.   Medications reviewed and include: Vitamin F79, Folic acid, Prozac, Megace, Protonix, Prednisone, MVI, Thiamine, Remeron Labs reviewed.    NUTRITION - FOCUSED PHYSICAL EXAM:  Flowsheet Row Most Recent Value  Orbital Region Severe depletion  Upper Arm Region Severe depletion  Thoracic and Lumbar Region Severe depletion  Buccal Region Severe depletion  Temple Region Severe depletion  Clavicle Bone Region Severe depletion  Clavicle and Acromion Bone Region Severe depletion  Scapular Bone Region Severe depletion  Patellar Region Severe depletion  Anterior Thigh Region Severe depletion  Posterior Calf Region Severe depletion  Edema (RD Assessment) None  Hair Reviewed  Eyes Reviewed  Mouth Reviewed  Skin Reviewed  Nails Reviewed       Diet Order:   Diet Order             Diet regular Room service appropriate? Yes; Fluid consistency: Thin  Diet effective now                   EDUCATION NEEDS:   No education needs have been identified at this time  Skin:  Skin Assessment: Reviewed RN Assessment (Ecchymosis)  Last BM:  04/08/21  Height:   Ht Readings from Last 1 Encounters:  04/07/21 5\' 4"  (1.626 m)    Weight:   Wt Readings from Last 1 Encounters:  04/07/21 40.8 kg    Ideal Body Weight:  54.5 kg  BMI:  Body mass index is 15.45 kg/m.  Estimated Nutritional Needs:   Kcal:  1500-1700 kcal/d  Protein:  75-85 g/d  Fluid:  1.5-1.8L/d    Hermina Barters BS, PLDN Clinical Dietitian See [facility] for contact information.

## 2021-04-10 NOTE — Progress Notes (Signed)
PROGRESS NOTE    Andrea Larson  SWF:[id number] DOB: [birth year] DOA: 04/07/2021 PCP: Sandrea Hughs, NP   Chief Complaint  Patient presents with   Altered Mental Status    Brief Narrative:  Patient 75 year old female history of hypertension, COPD, former heavy alcohol use, dementia, several month history of weight loss, adult failure to thrive presented to the ED with a 1 month history of intermittent confusion, occasional hallucinations and noted to have some slurred speech prompting family to bring her to the ED.  MRI brain done negative for any acute abnormalities.  Chest x-ray negative for any acute infiltrate.  Urinalysis nitrite negative leukocytes negative.  Patient noted to have a blood glucose level of 58.  Patient admitted for further evaluation and management.   Assessment & Plan:   Principal Problem:   Dehydration Active Problems:   COPD (chronic obstructive pulmonary disease) (HCC)   GERD (gastroesophageal reflux disease)   Essential hypertension   Dementia without behavioral disturbance (HCC)   Depression with anxiety   Acute metabolic encephalopathy   COPD with acute exacerbation (HCC)  #1 dehydration with mild hypoglycemia -Improved with D5 half-normal saline. -Patient with sodium level at 147 today and as such we will discontinue half-normal saline and placed on D5W for the next 24 hours.   -Supportive care.    2.  Acute metabolic encephalopathy -Patient noted to have been having some bouts of intermittent hallucinations, and confusion prior to admission. -Likely multifactorial secondary to dehydration, mild hypoglycemia, mild COPD exacerbation, possible component of sundowning.. -Patient noted to be agitated the evening of 04/08/2021.  -Continue high-dose IV thiamine. -IV Solu-Medrol tapered to oral prednisone and patient placed on Seroquel overnight with clinical improvement and no agitation reported. -Continue Pulmicort, Xopenex, Atrovent nebs, Flonase,  Claritin, PPI, Mucinex. -Supportive care.  3.  Mild acute COPD exacerbation -On examination on 04/08/2021, patient with poor air movement, tightness, some expiratory wheezing. -Improving clinically on steroid taper, Pulmicort, Xopenex, Atrovent nebs, Flonase, Claritin, PPI, Mucinex.   -Currently on oral prednisone taper.   -We will need outpatient follow-up with primary pulmonologist.   -Supportive care.    4.  Dementia/FTT -Patient noted to be agitated overnight.  May have a component of sundowning. -MRI brain negative for any acute abnormalities. -Folate at 19.2, vitamin B12 at 313. -Continue Aricept, Namenda.   -Patient started on Seroquel 25 mg nightly which seemed to have helped with her agitation overnight as no significant agitation reports per RN.  -Per family patient with significantly poor oral intake and recent weight loss. -Patient noted to have some good oral intake over the past 2 days, per RN. -Palliative care consultation for goals of care pending.    5.  Hypertension -BP stable.  28.  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 -Continue Ativan withdrawal protocol. -Continue high-dose IV thiamine, folic acid, multivitamin.  7.  Low vitamin B12 levels -Vitamin B12 1000 MCG's subcu daily x1 week, and then weekly x1 month, and then monthly. -Could potentially discharge home on oral vitamin B12 supplementation.  8.  Hyperlipidemia -Continue to hold statin and could resume on discharge.  9.  Depression/anxiety -Continue Wellbutrin, Prozac, Klonopin as needed.   -Seroquel nightly.  .   DVT prophylaxis: Heparin Code Status: Full Family Communication: Updated son and daughter-in-law at bedside.  Disposition:   Status is: Inpatient.  The patient will require care spanning > 2 midnights and should be  moved to inpatient because: Inpatient level of care appropriate due to  severity of illness  Dispo: The patient is from: Home              Anticipated d/c is to: Home with home health versus SNF pending palliative care evaluation.              Patient currently is not medically stable to d/c.   Difficult to place patient No       Consultants:  Palliative care pending.  Procedures:  Chest x-ray 04/07/2021 MRI brain 04/08/2021   Antimicrobials:  None   Subjective: Sitting up in chair.  Feels breathing has improved.  Denies any chest pain.  No shortness of breath.  No abdominal pain.  Good oral intake today.  Patient with no significant agitation overnight after receiving Seroquel last night.  Son at bedside.    Objective: Vitals:   04/09/21 2053 04/09/21 2356 04/10/21 0558 04/10/21 0728  BP:  120/67 117/72   Pulse:  95 96   Resp:  16 17   Temp:  98.3 F (36.8 C) 98.2 F (36.8 C)   TempSrc:  Oral Oral   SpO2: 99% 100% 100% 97%  Weight:      Height:        Intake/Output Summary (Last 24 hours) at 04/10/2021 1215 Last data filed at 04/10/2021 0845 Gross per 24 hour  Intake 600 ml  Output 1100 ml  Net -500 ml    Filed Weights   04/07/21 1231 04/07/21 1541  Weight: 40.8 kg 40.8 kg    Examination:  General exam: NAD. Respiratory system: Lungs clear to auscultation bilaterally.  Minimal expiratory wheezing.  Fair air movement.  Tightness improved.  Cardiovascular system: Regular rate rhythm no murmurs rubs or gallops.  No JVD.  No lower extremity edema. Gastrointestinal system: Abdomen soft, nontender, nondistended, positive bowel sounds.  No rebound.  No guarding.  Central nervous system: Alert and oriented. No focal neurological deficits. Extremities: Symmetric 5 x 5 power. Skin: No rashes, lesions or ulcers Psychiatry: Judgement and insight appear fair.. Mood & affect appropriate.     Data Reviewed: I have personally reviewed following labs and imaging studies  CBC: Recent Labs  Lab 04/07/21 1323 04/08/21 0616 04/09/21 0601  04/10/21 0502  WBC 6.4 9.2 6.5 9.0  NEUTROABS 4.8  --   --  6.2  HGB 15.6* 10.6* 10.4* 10.2*  HCT 50.1* 33.7* 33.2* 32.7*  MCV 94.0 95.2 94.6 95.9  PLT 199 298 324 242     Basic Metabolic Panel: Recent Labs  Lab 04/07/21 1323 04/08/21 0616 04/09/21 0601 04/10/21 0502  NA 147* 141 146* 147*  K 3.9 3.8 4.4 4.7  CL 115* 107 115* 113*  CO2 26 29 23 29   GLUCOSE 67* 82 169* 74  BUN 14 11 15 15   CREATININE 0.83 0.76 1.10* 0.89  CALCIUM 8.8* 8.8* 9.0 8.5*  MG 1.9  --  2.1  --   PHOS 2.7  --   --   --      GFR: Estimated Creatinine Clearance: 34.1 mL/min (by C-G formula based on SCr of 0.89 mg/dL).  Liver Function Tests: Recent Labs  Lab 04/07/21 1323  AST 29  ALT 43  ALKPHOS 68  BILITOT 0.5  PROT 6.8  ALBUMIN 3.1*     CBG: Recent Labs  Lab 04/09/21 1153 04/09/21 1801 04/09/21 2351 04/10/21 0554 04/10/21 0649  GLUCAP 114* 81 98 69* 76      Recent Results (  from the past 240 hour(s))  Urine Culture     Status: None   Collection Time: 04/03/21  3:42 PM   Specimen: Urine  Result Value Ref Range Status   MICRO NUMBER: [id number]  Final   SPECIMEN QUALITY: Adequate  Final   Sample Source URINE  Final   STATUS: FINAL  Final   Result:   Final    Mixed genital flora isolated. These superficial bacteria are not indicative of a urinary tract infection. No further organism identification is warranted on this specimen. If clinically indicated, recollect clean-catch, mid-stream urine and transfer  immediately to Urine Culture Transport Tube.   Resp Panel by RT-PCR (Flu A&B, Covid) Nasopharyngeal Swab     Status: None   Collection Time: 04/07/21  1:23 PM   Specimen: Nasopharyngeal Swab; Nasopharyngeal(NP) swabs in vial transport medium  Result Value Ref Range Status   SARS Coronavirus 2 by RT PCR NEGATIVE NEGATIVE Final    Comment: (NOTE) SARS-CoV-2 target nucleic acids are NOT DETECTED.  The SARS-CoV-2 RNA is generally detectable in upper respiratory specimens  during the acute phase of infection. The lowest concentration of SARS-CoV-2 viral copies this assay can detect is 138 copies/mL. A negative result does not preclude SARS-Cov-2 infection and should not be used as the sole basis for treatment or other patient management decisions. A negative result may occur with  improper specimen collection/handling, submission of specimen other than nasopharyngeal swab, presence of viral mutation(s) within the areas targeted by this assay, and inadequate number of viral copies(<138 copies/mL). A negative result must be combined with clinical observations, patient history, and epidemiological information. The expected result is Negative.  Fact Sheet for Patients:  EntrepreneurPulse.com.au  Fact Sheet for Healthcare Providers:  IncredibleEmployment.be  This test is no t yet approved or cleared by the [place] FDA and  has been authorized for detection and/or diagnosis of SARS-CoV-2 by FDA under an Emergency Use Authorization (EUA). This EUA will remain  in effect (meaning this test can be used) for the duration of the COVID-19 declaration under Section 564(b)(1) of the Act, 21 U.S.C.section 360bbb-3(b)(1), unless the authorization is terminated  or revoked sooner.       Influenza A by PCR NEGATIVE NEGATIVE Final   Influenza B by PCR NEGATIVE NEGATIVE Final    Comment: (NOTE) The Xpert Xpress SARS-CoV-2/FLU/RSV plus assay is intended as an aid in the diagnosis of influenza from Nasopharyngeal swab specimens and should not be used as a sole basis for treatment. Nasal washings and aspirates are unacceptable for Xpert Xpress SARS-CoV-2/FLU/RSV testing.  Fact Sheet for Patients: EntrepreneurPulse.com.au  Fact Sheet for Healthcare Providers: IncredibleEmployment.be  This test is not yet approved or cleared by the [place] FDA and has been authorized for detection  and/or diagnosis of SARS-CoV-2 by FDA under an Emergency Use Authorization (EUA). This EUA will remain in effect (meaning this test can be used) for the duration of the COVID-19 declaration under Section 564(b)(1) of the Act, 21 U.S.C. section 360bbb-3(b)(1), unless the authorization is terminated or revoked.  Performed at [facility], [place] [address], [place], [place] [address]   Blood Culture (routine x 2)     Status: None (Preliminary result)   Collection Time: 04/07/21  1:23 PM   Specimen: BLOOD  Result Value Ref Range Status   Specimen Description   Final    BLOOD RIGHT ANTECUBITAL Performed at [place] [address] [place],  [address]    Special Requests  Final    BOTTLES DRAWN AEROBIC AND ANAEROBIC Blood Culture results may not be optimal due to an inadequate volume of blood received in culture bottles Performed at [facility], [place] [address], [place], [place] [address]    Culture   Final    NO GROWTH 3 DAYS Performed at [facility], [place] [address], [place], [place] [address]    Report Status PENDING  Incomplete  Urine Culture     Status: None   Collection Time: 04/07/21  1:23 PM   Specimen: Urine, Clean Catch  Result Value Ref Range Status   Specimen Description   Final    URINE, CLEAN CATCH Performed at [facility], [place] [address], [place], [place] [address]    Special Requests   Final    NONE Performed at [facility], [place] [address] [place], [place] [address]    Culture   Final    NO GROWTH Performed at [facility], [place] [address] [place], [place] [address]    Report Status 04/09/2021 FINAL  Final  Blood Culture (routine x 2)     Status: None (Preliminary result)   Collection Time: 04/07/21 10:01 PM   Specimen: BLOOD  Result Value Ref Range Status   Specimen Description   Final    BLOOD RIGHT ANTECUBITAL Performed at [place] [address] [place], [place] [address]    Special Requests   Final    BOTTLES DRAWN AEROBIC AND ANAEROBIC Blood Culture adequate volume Performed at [place] [address] [place], [place] [address]    Culture   Final    NO GROWTH 3 DAYS Performed at [facility], [place] [address] [place], [place] [address]    Report Status PENDING  Incomplete          Radiology Studies: DG Chest 1 View  Result Date: 04/09/2021 CLINICAL DATA:  Shortness of breath.  History of asthma. EXAM: CHEST  1 VIEW COMPARISON:  April 07, 2021 FINDINGS: The heart size and mediastinal contours are within normal limits. Both lungs are clear. The visualized skeletal structures are unremarkable. IMPRESSION: No active disease. Electronically Signed   By: Dorise Bullion III M.D.   On: 04/09/2021 05:05        Scheduled Meds:  budesonide (PULMICORT) nebulizer solution  0.5 mg Nebulization BID   buPROPion  150 mg Oral Daily   cyanocobalamin  1,000 mcg Subcutaneous Daily   donepezil  5 mg Oral QHS   feeding supplement  237 mL Oral TID BM   FLUoxetine  40 mg Oral Daily   fluticasone  2 spray Each Nare Daily   folic acid  1 mg Oral Daily   guaiFENesin  1,200 mg Oral BID   heparin injection (subcutaneous)  5,000 Units Subcutaneous Q12H   ipratropium  0.5 mg Nebulization TID   loratadine  10 mg Oral Daily   megestrol  40 mg Oral Daily   memantine  10 mg Oral BID   mirtazapine  7.5 mg Oral QHS   multivitamin with minerals  1 tablet Oral Daily   pantoprazole  40 mg Oral Daily   predniSONE  40 mg Oral QAC breakfast   QUEtiapine  25 mg Oral QHS   Continuous Infusions:  dextrose 75 mL/hr at 04/10/21 0848   thiamine injection 500 mg (04/10/21 0546)     LOS: 2 days    Time spent: 40 minutes  Irine Seal, MD [facility]   To contact the attending provider between 7A-7P or the covering provider during after hours 7P-7A, please log  into the web site [email or web address] and access using universal [place] password for that web site. If you do not have the password, please call the hospital operator.  04/10/2021, 12:15 PM

## 2021-04-10 NOTE — Evaluation (Signed)
Clinical/Bedside Swallow Evaluation Patient Details  Name: Andrea Larson MRN: [id number] Date of Birth: Oct 31, 1945  Today's Date: 04/10/2021 Time: SLP Start Time (ACUTE ONLY): 30 SLP Stop Time (ACUTE ONLY): 1410 SLP Time Calculation (min) (ACUTE ONLY): 15 min  Past Medical History:  Past Medical History:  Diagnosis Date   Allergic rhinitis    Allergic rhinitis, cause unspecified 09/21/2011   Aortic stenosis 08/23/2016   Arthritis    Asthma    Asthma 09/21/2011   Colon polyps    COPD (chronic obstructive pulmonary disease) (HCC)    mild    Coronary artery calcification 08/23/2016   Dementia ([place])    Depression with anxiety    HTN (hypertension)    Hyperlipidemia    UTI (lower urinary tract infection)    Past Surgical History:  Past Surgical History:  Procedure Laterality Date   DENTAL SURGERY     pilonidial cyst     TONSILLECTOMY     HPI:  Andrea Larson admitted 04/07/21 with hallucinations. 1 month hx intermittent confusion, hallucinations, slurred speech. PMH: GERD, HTN, COPD, former heavy EtOH use, dementia, failure to thrive, depression, anxiety, weight loss. MRI = negative for acute abnormalities, CXR negative    Assessment / Plan / Recommendation  Clinical Impression  Pt seen at bedside to assess swallow function and safety. Chart review indicates history of GERD (on PPI), dementia, and COPD. Pt is at increased risk for dysphagia and silent aspiration related to dementia and COPD. CN exam unremarkable. Pt accepted trials of thin liquid, puree, and solid textures. Extended oral prep of solid consistency, likely due to missing dentition. No overt s/s aspiration following any presentation. Pt was noted to belch frequently during this assessment, raising concern for esophageal issues. Recommend consideration of a regular barium swallow (esophagram) to assess esophageal motility, as primary esophageal dysphagia is suspected. SLP discussed results and recommendations with pt, RN,  and son. SLP will follow up for education after results of esophageal work up are available. Safe swallow precautions posted at [facility].  SLP Visit Diagnosis: Dysphagia, unspecified (R13.10)    Aspiration Risk  Moderate aspiration risk;Risk for inadequate nutrition/hydration    Diet Recommendation Thin liquid;Regular   Liquid Administration via: Cup;Straw Medication Administration: Whole meds with liquid Supervision: Patient able to self feed Compensations: Minimize environmental distractions;Slow rate;Small sips/bites Postural Changes: Seated upright at 90 degrees;Remain upright for at least 30 minutes after po intake    Other  Recommendations Recommended Consults: Consider esophageal assessment Oral Care Recommendations: Oral care BID    Recommendations for follow up therapy are one component of a multi-disciplinary discharge planning process, led by the attending physician.  Recommendations may be updated based on patient status, additional functional criteria and insurance authorization.  Follow up Recommendations Other (comment) (TBD)      Frequency and Duration min 1 x/week  1 week;2 weeks       Prognosis Prognosis for Safe Diet Advancement: Fair Barriers to Reach Goals: Cognitive deficits      Swallow Study   General Date of Onset: 04/07/21 HPI: Andrea Larson admitted 04/07/21 with hallucinations. 1 month hx intermittent confusion, hallucinations, slurred speech. PMH: GERD, HTN, COPD, former heavy EtOH use, dementia, failure to thrive, depression, anxiety, weight loss. MRI = negative for acute abnormalities, CXR negative Type of Study: Bedside Swallow Evaluation Previous Swallow Assessment: none Diet Prior to this Study: Regular;Thin liquids Temperature Spikes Noted: No Respiratory Status: Nasal cannula History of Recent Intubation: No Behavior/Cognition: Alert;Cooperative Oral Cavity Assessment: Within Functional Limits Oral  Care Completed by SLP: No Oral Cavity -  Dentition: Other (Comment) (edentulous upper, teeth lower) Vision: Functional for self-feeding Self-Feeding Abilities: Able to feed self Patient Positioning: Upright in bed Baseline Vocal Quality: Normal Volitional Cough: Strong Volitional Swallow: Able to elicit    Oral/Motor/Sensory Function Overall Oral Motor/Sensory Function: Within functional limits   Ice Chips Ice chips: Not tested   Thin Liquid Thin Liquid: Within functional limits Presentation: Straw    Nectar Thick Nectar Thick Liquid: Not tested   Honey Thick Honey Thick Liquid: Not tested   Puree Puree: Within functional limits Presentation: Spoon   Solid     Solid: Impaired Presentation: Self Fed Oral Phase Functional Implications: Prolonged oral transit Other Comments: extended oral prep, likely due to lack of upper dentition     [name] B. Quentin Ore, [facility], [place] Speech Language Pathologist Office: [phone]  [name], [name] 04/10/2021,2:49 PM

## 2021-04-11 ENCOUNTER — Inpatient Hospital Stay (HOSPITAL_COMMUNITY): Payer: Medicare HMO

## 2021-04-11 DIAGNOSIS — E43 Unspecified severe protein-calorie malnutrition: Secondary | ICD-10-CM

## 2021-04-11 DIAGNOSIS — R0602 Shortness of breath: Secondary | ICD-10-CM

## 2021-04-11 DIAGNOSIS — R933 Abnormal findings on diagnostic imaging of other parts of digestive tract: Secondary | ICD-10-CM

## 2021-04-11 DIAGNOSIS — R1310 Dysphagia, unspecified: Secondary | ICD-10-CM

## 2021-04-11 DIAGNOSIS — K219 Gastro-esophageal reflux disease without esophagitis: Secondary | ICD-10-CM

## 2021-04-11 LAB — CBC
HCT: 33.2 % — ABNORMAL LOW (ref 36.0–46.0)
Hemoglobin: 10.5 g/dL — ABNORMAL LOW (ref 12.0–15.0)
MCH: 30.2 pg (ref 26.0–34.0)
MCHC: 31.6 g/dL (ref 30.0–36.0)
MCV: 95.4 fL (ref 80.0–100.0)
Platelets: 270 10*3/uL (ref 150–400)
RBC: 3.48 MIL/uL — ABNORMAL LOW (ref 3.87–5.11)
RDW: 14.6 % (ref 11.5–15.5)
WBC: 8.1 10*3/uL (ref 4.0–10.5)
nRBC: 0 % (ref 0.0–0.2)

## 2021-04-11 LAB — BASIC METABOLIC PANEL
Anion gap: 5 (ref 5–15)
BUN: 19 mg/dL (ref 8–23)
CO2: 32 mmol/L (ref 22–32)
Calcium: 8.8 mg/dL — ABNORMAL LOW (ref 8.9–10.3)
Chloride: 106 mmol/L (ref 98–111)
Creatinine, Ser: 0.86 mg/dL (ref 0.44–1.00)
GFR, Estimated: >60 mL/min (ref >60)
Glucose, Bld: 93 mg/dL (ref 70–99)
Potassium: 4.2 mmol/L (ref 3.5–5.1)
Sodium: 143 mmol/L (ref 135–145)

## 2021-04-11 LAB — GLUCOSE, CAPILLARY
Glucose-Capillary: 146 mg/dL — ABNORMAL HIGH (ref 70–99)
Glucose-Capillary: 147 mg/dL — ABNORMAL HIGH (ref 70–99)
Glucose-Capillary: 90 mg/dL (ref 70–99)
Glucose-Capillary: 130 mg/dL — ABNORMAL HIGH (ref 70–99)

## 2021-04-11 MED ORDER — IPRATROPIUM BROMIDE 0.02 % IN SOLN: 0.5000 mg | Freq: Three times a day (TID) | RESPIRATORY_TRACT | 1 refills | Status: DC

## 2021-04-11 MED ORDER — FLUTICASONE PROPIONATE 50 MCG/ACT NA SUSP: 2.0000 | Freq: Every day | NASAL | 0 refills | Status: DC

## 2021-04-11 MED ORDER — VITAMIN B-12 1000 MCG PO TABS: 1000.0000 ug | ORAL_TABLET | Freq: Every day | ORAL | 1 refills | Status: AC

## 2021-04-11 MED ORDER — QUETIAPINE FUMARATE 25 MG PO TABS: 25.0000 mg | ORAL_TABLET | Freq: Every day | ORAL | 1 refills | Status: DC

## 2021-04-11 MED ORDER — OMEPRAZOLE MAGNESIUM 20 MG PO TBEC: 40.0000 mg | DELAYED_RELEASE_TABLET | Freq: Every day | ORAL | 1 refills | Status: DC

## 2021-04-11 MED ORDER — FOLIC ACID 1 MG PO TABS: 1.0000 mg | ORAL_TABLET | Freq: Every day | ORAL | Status: DC

## 2021-04-11 MED ORDER — THIAMINE HCL 100 MG PO TABS: 100.0000 mg | ORAL_TABLET | Freq: Every day | ORAL | Status: DC

## 2021-04-11 MED ORDER — PANTOPRAZOLE SODIUM 40 MG PO TBEC: 40.0000 mg | DELAYED_RELEASE_TABLET | Freq: Two times a day (BID) | ORAL | Status: DC

## 2021-04-11 MED ORDER — GUAIFENESIN ER 600 MG PO TB12: 1200.0000 mg | ORAL_TABLET | Freq: Two times a day (BID) | ORAL | 0 refills | Status: AC

## 2021-04-11 MED ORDER — LEVALBUTEROL HCL 0.63 MG/3ML IN NEBU
0.6300 mg | INHALATION_SOLUTION | Freq: Three times a day (TID) | RESPIRATORY_TRACT | 1 refills | Status: DC
Start: 2021-04-11 — End: 2021-06-23

## 2021-04-11 MED ORDER — PREDNISONE 10 MG PO TABS: ORAL_TABLET | ORAL | 0 refills | Status: AC

## 2021-04-11 MED ORDER — ADULT MULTIVITAMIN W/MINERALS CH: 1.0000 | ORAL_TABLET | Freq: Every day | ORAL | Status: AC

## 2021-04-11 NOTE — TOC Initial Note (Signed)
Transition of Care [facility]) - Initial/Assessment Note    Patient Details  Name: Andrea Larson MRN: [id number] Date of Birth: 02-Oct-1945  [facility] CM/SW Contact:    Javarian Jakubiak, Marjie Skiff, RN Phone Number: 04/11/2021, 1:37 PM  Clinical Narrative:                 Pt from home with son and has caregivers during the day. Spoke with son Gwyndolyn Saxon via phone for DC planning. Gwyndolyn Saxon states that they would like pt to come back home with caregivers and family support. Home health physical therapy was discussed and choice of companies offered. [facility] chosen. [facility] liaison contacted for referral. Home palliative services also discussed and [facility] chosen. [facility] liaison alerted of need for outpt palliative referral.   Home nebulizer ordered by MD which Gwyndolyn Saxon states is not needed as she already has one at home. Pt was on nocturnal oxygen from [employer] prior to admission. MD order placed for pt to have continuous 02 now. [employer] contacted to alert of new 02 requirement and need for transport tank to be delivered to pt room for transport home.  Expected Discharge Plan: [place] Barriers to Discharge: No Barriers Identified   Patient Goals and CMS Choice Patient states their goals for this hospitalization and ongoing recovery are:: to go home CMS Medicare.gov Compare Post Acute Care list provided to:: Patient Choice offered to / list presented to : Patient  Expected Discharge Plan and Services Expected Discharge Plan: [place]   Discharge Planning Services: CM Consult Post Acute Care Choice: [place] arrangements for the past 2 months: Single Family Home                 DME Arranged: Oxygen DME Agency: [employer] Date DME Agency Contacted: 04/11/21 Time DME Agency Contacted: 1200 Representative spoke with at DME Agency: [place]: PT [place]: [place] Date [place]: 04/11/21    Representative spoke with at [place]: Tommi Rumps  Prior Living Arrangements/Services Living arrangements for the past 2 months: [place] Lives with:: Adult Children Patient language and need for interpreter reviewed:: Yes Do you feel safe going back to the place where you live?: Yes      Need for Family Participation in Patient Care: Yes (Comment) Care giver support system in place?: Yes (comment) Current home services: Homehealth aide Criminal Activity/Legal Involvement Pertinent to Current Situation/Hospitalization: No - Comment as needed  Activities of Daily Living Home Assistive Devices/Equipment: None ADL Screening (condition at time of admission) Patient's cognitive ability adequate to safely complete daily activities?: No Is the patient deaf or have difficulty hearing?: Yes Does the patient have difficulty seeing, even when wearing glasses/contacts?: No Does the patient have difficulty concentrating, remembering, or making decisions?: Yes Patient able to express need for assistance with ADLs?: Yes Does the patient have difficulty dressing or bathing?: No Independently performs ADLs?: Yes (appropriate for developmental age) Does the patient have difficulty walking or climbing stairs?: No Weakness of Legs: Both Weakness of Arms/Hands: None  Permission Sought/Granted Permission sought to share information with : Chartered certified accountant granted to share information with : Yes, Verbal Permission Granted     Permission granted to share info w AGENCY: Alvis Lemmings, [employer]        Emotional Assessment Appearance:: Appears stated age     Orientation: : Oriented to Self, Oriented to Place, Oriented to  Time, Oriented to Situation Alcohol /  Substance Use: Not Applicable Psych Involvement: No (comment)  Admission diagnosis:  Dehydration [E86.0] Encephalopathy [G93.40] Patient Active Problem List   Diagnosis Date Noted   Protein-calorie malnutrition,  severe 54/98/2641   Acute metabolic encephalopathy 58/30/9407   COPD with acute exacerbation ([place]) 04/08/2021   Dehydration 04/07/2021   Aortic atherosclerosis ([place]) 04/04/2021   Depression with anxiety    Confusion 12/17/2020   History of colon polyps 10/17/2020   Vitamin D deficiency 10/17/2020   Dementia without behavioral disturbance ([place]) 08/09/2020   Gait abnormality 08/09/2020   Chronic hypoxemic respiratory failure ([place]) 04/10/2020   Dementia ([place]) 06/24/2018   Memory loss 06/24/2018   Weight loss 06/24/2018   Peripheral edema 05/21/2018   Gait disorder 05/21/2018   Diarrhea 04/28/2018   CKD (chronic kidney disease), stage III ([place]) 04/28/2018   Malnutrition of moderate degree 04/13/2018   Essential hypertension 04/11/2018   Gallstones 04/11/2018   Hypokalemia 03/21/2018   UTI (urinary tract infection) 02/19/2018   Coronary artery calcification 08/23/2016   Aortic stenosis 08/23/2016   Chest pain 07/20/2016   Hyperglycemia 07/20/2016   Insomnia 08/18/2015   GERD (gastroesophageal reflux disease) 08/18/2015   Acute bronchitis 02/15/2015   Back pain 10/28/2012   Asthma 09/21/2011   Allergic rhinitis 09/21/2011   Colon polyps 09/21/2011   Fatigue 09/21/2011   Encounter for well adult exam with abnormal findings 09/15/2011   DEGENERATIVE JOINT DISEASE, CERVICAL SPINE 10/21/2008   Overweight(278.02) 02/10/2008   ANXIETY DEPRESSION 02/10/2008   COPD (chronic obstructive pulmonary disease) ([place]) 02/10/2008   EMPHYSEMA NEC 01/30/2007   Hyperlipidemia 12/31/2006   PCP:  Sandrea Hughs, NP Pharmacy:   [place], [place] [address] Phone: [phone] Fax: [place] [address] Phone: [phone] Fax: [phone]     Social Determinants of Health (SDOH) Interventions    Readmission Risk  Interventions Readmission Risk Prevention Plan 04/11/2021  Transportation Screening Complete  PCP or Specialist Appt within 3-5 Days Complete  HRI or [place] Complete  Social Work Consult for [place] Planning/Counseling Complete  Palliative Care Screening Complete  Medication Review Press photographer) Complete  Some recent data might be hidden

## 2021-04-11 NOTE — Discharge Summary (Signed)
Physician Discharge Summary  Andrea Larson VXB:[id number] DOB: September 19, 1945 DOA: 04/07/2021  PCP: Sandrea Hughs, NP  Admit date: 04/07/2021 Discharge date: 04/11/2021  Time spent: 55 minutes  Recommendations for Outpatient Follow-up:  Follow-up with Dr. Vaughan Browner, pulmonology in 1 to 2 weeks for further optimization and management of COPD. Follow-up with Ngetich, Dinah C, NP in 2 weeks.  On follow-up patient blood pressure need to be reassessed as patient's antihypertensive medications were discontinued on discharge.  Patient will need a basic metabolic profile done to follow-up on electrolytes and renal function.  Patient's vitamin B12 levels will need to be followed up upon as patient noted to have low vitamin B12 during the hospitalization and discharged on vitamin B12 supplementation. Outpatient follow-up with Dr. Fuller Plan, gastroenterology as needed. Outpatient follow-up with palliative care in the community.   Discharge Diagnoses:  Principal Problem:   Dehydration Active Problems:   COPD (chronic obstructive pulmonary disease) (HCC)   GERD (gastroesophageal reflux disease)   Essential hypertension   Dementia without behavioral disturbance (HCC)   Depression with anxiety   Acute metabolic encephalopathy   COPD with acute exacerbation (HCC)   Protein-calorie malnutrition, severe   Dysphagia   SOB (shortness of breath)   Discharge Condition: Stable and improved.  Diet recommendation: Dysphagia 3 diet.  Filed Weights   04/07/21 1231 04/07/21 1541  Weight: 40.8 kg 40.8 kg    History of present illness:  HPI per Dr. Leonie Douglas Andrea Larson is a 75 y.o. female with medical history significant of HTN, COPD, former heavy EtOH use, dementia.   Pt with several months of wt loss, adult failure to thrive.   Pt with 1 month h/o intermittent confusion.  ? UTI, on and off ABx.  Occasional hallucinations.   Past 3 days having on and off hallucinations.   This morning speech  slurred prompting son to bring her in to ED.   No cough, no recent trauma.     ED Course: UA neg for UTI.   Work up shows mild dehydration and hypoglycemia (BGL in the 50s/60s.  Hospital Course:  #1 dehydration with mild hypoglycemia/hypernatremia -Improved with D5 half-normal saline. -Patient with sodium level at 147 during the hospitalization and placed on D5W with resolution of hyponatremia with sodium down to 143.   -Patient's oral intake improved during the hospitalization.   -Outpatient follow-up with PCP.    2.  Acute metabolic encephalopathy -Patient noted to have been having some bouts of intermittent hallucinations, and confusion prior to admission. -Likely multifactorial secondary to dehydration, mild hypoglycemia, mild COPD exacerbation, possible component of sundowning.. -Patient noted to be agitated the evening of 04/08/2021.  -Patient also treated for mild COPD exacerbation, also received high-dose IV thiamine during the hospitalization.   -Patient improved clinically and was close to baseline by day of discharge.   -Outpatient follow-up.    3.  Mild acute COPD exacerbation -On examination on 04/08/2021, patient with poor air movement, tightness, some expiratory wheezing. -Patient placed on a steroid taper, Pulmicort, Xopenex and Atrovent nebs, Flonase, Claritin, PPI Mucinex.   -Patient improved clinically will be discharged home on a slow steroid taper, scheduled nebs 3 times daily, Flonase, Claritin, PPI, 3-4 more days of Mucinex.   -Outpatient follow-up with primary pulmonologist, Dr. Vaughan Browner to maximize his COPD treatments.   -Patient also discharged home with portable O2.   4.  Dementia/FTT -Patient noted to be agitated overnight.  May have a component of sundowning. -MRI brain negative for any acute abnormalities. -  Folate at 19.2, vitamin B12 at 313. -Continue Aricept, Namenda.   -Patient started on Seroquel 25 mg nightly which seemed to have helped with her  agitation overnight as no significant agitation reports per RN.  -Per family patient with significantly poor oral intake and recent weight loss. -Patient noted to have some good oral intake during the hospitalization however patient also noted to be on prednisone.   -Patient seen in consultation by palliative care, Dr. Hilma Favors, and in discussion with family they felt the like to keep patient at home with the help of private duty caregivers and home health.  -Portable oxygen was ordered for patient and patient to follow-up with her primary pulmonologist Dr. Vaughan Browner as outpatient.   -Patient COPD treatment maximized.   -Patient also placed on Seroquel which will be continued for sleep.   -Outpatient follow-up with palliative care.   5.  Hypertension -BP stable. -Patient's antihypertensive medications were held and will be discontinued on discharge. -Outpatient follow-up with PCP.  20.  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 -Patient maintained on Ativan withdrawal protocol during the hospitalization.   -Patient also placed on high-dose IV thiamine, folic acid and multivitamin during the hospitalization will be discharged home on oral thiamine 956 mg daily, folic acid 1 mg daily, multivitamin daily.   -Outpatient follow-up with PCP.    7.  Low vitamin B12 levels -Vitamin B12 levels noted to be low at 313.   -Patient placed on vitamin B12 supplementation 1000 MCG subcu daily during the hospitalization will be discharged home on oral vitamin B12 supplementation.   -Outpatient follow-up with PCP.    8.  Hyperlipidemia -Statin held during the hospitalization will be resumed on discharge.  9.  Depression/anxiety -Patient maintained on home regimen of Wellbutrin, Prozac, Klonopin as needed.   -Seroquel nightly.  10.  Abnormal barium esophagram -Patient was seen by speech therapist during the  hospitalization who recommended a barium esophagram which was done on 04/11/2021 which showed a smooth narrowing of the GE junction with no irregularity noted, distal longitudinal full-thickness in the esophagus which may indicate esophagitis, primary peristaltic waves were preserved in the esophagus on 3/4 swallows, pharyngeal phase not assessed. -Due to abnormal barium esophagram GI consultation obtained and patient seen in consultation by Dr. Fuller Plan. -GI when assessed the patient and case discussed with patient's son who did not relay any problems with solid food dysphagia prior to admission and after discussion with the sounds they did not want an EGD given her overall situation plus benign-appearing findings on esophagram and as such EGD not recommended at this time.  PPI was recommended daily per GI.  Outpatient follow-up with GI as needed.  Procedures: Chest x-ray 04/07/2021 MRI brain 04/08/2021 Barium esophagram 04/11/2021    Consultations: Gastroenterology: Dr. Fuller Plan 04/11/2021 Palliative care: Dr. Hilma Favors 04/10/2021   Discharge Exam: Vitals:   04/11/21 1236 04/11/21 1411  BP:  109/72  Pulse: (!) 102 (!) 104  Resp:  14  Temp:  98.4 F (36.9 C)  SpO2:  97%    General: NAD Cardiovascular: RRR no murmurs rubs or gallops.  No JVD, no lower extremity edema. Respiratory: Lungs clear to auscultation bilaterally.  No wheezes, no crackles, no rhonchi.  Discharge Instructions   Discharge Instructions     Ambulatory referral to Pulmonology   Complete by: As directed    F/u in 1-2 weeks.   Reason for referral: Asthma/COPD  Diet general   Complete by: As directed    Dsyphagia 3 diet   Increase activity slowly   Complete by: As directed       Allergies as of 04/11/2021       Reactions   Lipitor [atorvastatin] Other (See Comments)   Myalgias        Medication List     STOP taking these medications    Lidocaine 4 % Ptch Commonly known as: HM Lidocaine Patch    megestrol 40 MG tablet Commonly known as: MEGACE   melatonin 5 MG Tabs   Spiriva Respimat 2.5 MCG/ACT Aers Generic drug: Tiotropium Bromide Monohydrate       TAKE these medications    acetaminophen 325 MG tablet Commonly known as: TYLENOL Take 650 mg by mouth every 6 (six) hours as needed for mild pain or headache.   amLODipine 5 MG tablet Commonly known as: NORVASC Take 1 tablet (5 mg total) by mouth daily.   buPROPion 150 MG 24 hr tablet Commonly known as: WELLBUTRIN XL Take 1 tablet (150 mg total) by mouth daily.   clonazePAM 0.5 MG tablet Commonly known as: KLONOPIN Take 1 tablet (0.5 mg total) by mouth 2 (two) times daily as needed for anxiety.   donepezil 5 MG tablet Commonly known as: Aricept Take 1 tablet (5 mg total) by mouth at bedtime.   feeding supplement Liqd Take 237 mLs by mouth 3 (three) times daily between meals.   FLUoxetine 40 MG capsule Commonly known as: PROZAC TAKE (1) CAPSULE DAILY.   fluticasone 50 MCG/ACT nasal spray Commonly known as: FLONASE Place 2 sprays into both nostrils daily. Start taking on: April 12, 2021   Flutter Devi Use as directed   folic acid 1 MG tablet Commonly known as: FOLVITE Take 1 tablet (1 mg total) by mouth daily. Start taking on: April 12, 2021   guaiFENesin 600 MG 12 hr tablet Commonly known as: MUCINEX Take 2 tablets (1,200 mg total) by mouth 2 (two) times daily for 4 days.   ipratropium 0.02 % nebulizer solution Commonly known as: ATROVENT Take 2.5 mLs (0.5 mg total) by nebulization 3 (three) times daily.   levalbuterol 0.63 MG/3ML nebulizer solution Commonly known as: XOPENEX Take 3 mLs (0.63 mg total) by nebulization in the morning, at noon, and at bedtime. USE 1 VIAL VIA NEBULIZER EVERY 6 HOURS AS NEEDED FOR WHEEZING OR SHORTNESS OF BREATH. What changed:  how much to take how to take this when to take this   levalbuterol 45 MCG/ACT inhaler Commonly known as: XOPENEX HFA USE 2 PUFFS  EVERY 6 HOURS AS NEEDED FOR WHEEZING. **SHAKE WELL**   memantine 10 MG tablet Commonly known as: NAMENDA Take 1 tablet (10 mg total) by mouth 2 (two) times daily.   mirtazapine 7.5 MG tablet Commonly known as: REMERON Take 1 tablet (7.5 mg total) by mouth at bedtime.   montelukast 10 MG tablet Commonly known as: SINGULAIR Take 1 tablet (10 mg total) by mouth daily.   multivitamin with minerals Tabs tablet Take 1 tablet by mouth daily. Start taking on: April 12, 2021   omeprazole 20 MG tablet Commonly known as: PriLOSEC OTC Take 2 tablets (40 mg total) by mouth daily.   predniSONE 10 MG tablet Commonly known as: DELTASONE Take 4 tablets (40 mg total) by mouth daily before breakfast for 2 days, THEN 3 tablets (30 mg total) daily before breakfast for 4 days, THEN 2 tablets (20 mg total) daily before breakfast for 4  days, THEN 1 tablet (10 mg total) daily before breakfast for 4 days. Start taking on: April 12, 2021   QUEtiapine 25 MG tablet Commonly known as: SEROQUEL Take 1 tablet (25 mg total) by mouth at bedtime.   rosuvastatin 20 MG tablet Commonly known as: CRESTOR Take 1 tablet (20 mg total) by mouth daily.   Thera-D 2000 50 MCG (2000 UT) Tabs Generic drug: Cholecalciferol 1 tab by mouth once daily   thiamine 100 MG tablet Take 1 tablet (100 mg total) by mouth daily.   traMADol 50 MG tablet Commonly known as: ULTRAM Take 1 tablet (50 mg total) by mouth every 6 (six) hours as needed.   Trelegy Ellipta 200-62.5-25 MCG/INH Aepb Generic drug: Fluticasone-Umeclidin-Vilant INHALE 1 PUFF INTO THE LUNGS ONCE DAILY AS DIRECTED.   vitamin B-12 1000 MCG tablet Commonly known as: CYANOCOBALAMIN Take 1 tablet (1,000 mcg total) by mouth daily.               Durable Medical Equipment  (From admission, onward)           Start     Ordered   04/10/21 1952  For home use only DME Nebulizer machine  Once       Question Answer Comment  Patient needs a nebulizer  to treat with the following condition COPD (chronic obstructive pulmonary disease) ([place])   Length of Need Lifetime      04/10/21 1951   04/10/21 1952  For home use only DME oxygen  Once       Question Answer Comment  Length of Need Lifetime   Mode or (Route) Nasal cannula   Liters per Minute 2   Frequency Continuous (stationary and portable oxygen unit needed)   Oxygen conserving device Yes   Oxygen delivery system Gas      04/10/21 1952           Allergies  Allergen Reactions   Lipitor [Atorvastatin] Other (See Comments)    Myalgias    Follow-up Information     Ngetich, Dinah C, NP. Schedule an appointment as soon as possible for a visit in 2 week(s).   Specialty: Family Medicine Contact information: [place] [address] [phone]         Skeet Latch, MD .   Specialty: Cardiology Contact information: [address] [place] [address] [phone]         Marshell Garfinkel, MD. Schedule an appointment as soon as possible for a visit in 2 week(s).   Specialty: Pulmonary Disease Why: f/u in 1-2 weeks. Contact information: [address] [place] [address] [phone]         Ladene Artist, MD Follow up.   Specialty: Gastroenterology Why: As needed Contact information: [address] [place] [address] [phone]                  The results of significant diagnostics from this hospitalization (including imaging, microbiology, ancillary and laboratory) are listed below for reference.    Significant Diagnostic Studies: DG Chest 1 View  Result Date: 04/09/2021 CLINICAL DATA:  Shortness of breath.  History of asthma. EXAM: CHEST  1 VIEW COMPARISON:  April 07, 2021 FINDINGS: The heart size and mediastinal contours are within normal limits. Both lungs are clear. The visualized skeletal structures are unremarkable. IMPRESSION: No active disease. Electronically Signed   By: Dorise Bullion III M.D.   On: 04/09/2021 05:05   CT Head  Wo Contrast  Result Date: 04/07/2021 CLINICAL DATA:  Altered mental status Dementia Possible UTI EXAM: CT HEAD WITHOUT CONTRAST TECHNIQUE: Contiguous axial images were obtained from the base of the skull through the vertex without intravenous contrast. COMPARISON:  CT head 02/26/2021 FINDINGS: Brain: No evidence of acute infarction, hemorrhage, hydrocephalus, extra-axial collection or mass lesion/mass effect. Vascular: No hyperdense vessel or unexpected calcification. Skull: Normal. Negative for fracture or focal lesion. Sinuses/Orbits: Partial opacification of the left sphenoid sinus. Other: None. IMPRESSION: No acute intracranial abnormality. Electronically Signed   By: Miachel Roux M.D.   On: 04/07/2021 16:21   MR BRAIN WO CONTRAST  Result Date: 04/08/2021 CLINICAL DATA:  Encephalopathy EXAM: MRI HEAD WITHOUT CONTRAST TECHNIQUE: Multiplanar, multiecho pulse sequences of the brain and surrounding structures were obtained without intravenous contrast. COMPARISON:  06/04/2020 FINDINGS: Brain: No acute infarct, mass effect or extra-axial collection. Focus of chronic microhemorrhage right frontal lobe. No acute hemorrhage. There is multifocal hyperintense T2-weighted signal within the white matter. Generalized volume loss without a clear lobar predilection. The midline structures are normal. Vascular: Major flow voids are preserved. Skull and upper cervical spine: Normal calvarium and skull base. Visualized upper cervical spine and soft tissues are normal. Sinuses/Orbits:No paranasal sinus fluid levels or advanced mucosal thickening. No mastoid or middle ear effusion. Normal orbits. IMPRESSION: 1. No acute intracranial abnormality. 2. Generalized volume loss and findings of chronic small vessel disease. Electronically Signed   By: Ulyses Jarred M.D.   On: 04/08/2021 00:42   DG Chest Port 1 View  Result Date: 04/07/2021 CLINICAL DATA:  Questionable sepsis -  evaluate for abnormality EXAM: PORTABLE CHEST 1 VIEW COMPARISON:  02/26/2021. FINDINGS: The heart size and mediastinal contours are within normal limits. Both lungs are clear. No visible pleural effusions or pneumothorax. Old rib fractures better seen on the prior. IMPRESSION: No acute cardiopulmonary disease. Electronically Signed   By: Margaretha Sheffield M.D.   On: 04/07/2021 13:27   DG ESOPHAGUS W SINGLE CM (SOL OR THIN BA)  Result Date: 04/11/2021 CLINICAL DATA:  Dysphagia, silent aspiration EXAM: ESOPHOGRAM/BARIUM SWALLOW TECHNIQUE: Single contrast examination was performed using  thin barium. FLUOROSCOPY TIME:  Fluoroscopy Time:  2 minutes, 24 seconds Radiation Exposure Index (if provided by the fluoroscopic device): 13.5 mGy Number of Acquired Spot Images: 0 COMPARISON:  Chest CT 09/23/2020 FINDINGS: The patient has reduced ability to stand or turn, and accordingly today's exam was performed exclusively in the LPO position. Primary peristaltic waves were intact on 3/4 swallows. Mild distal esophageal fold thickening which could reflect esophagitis. There is smooth narrowing of the distal esophagus in the vicinity of the gastroesophageal junction along the hiatus, I was not able to distend this area more than about 8 mm in diameter. The 13 mm barium tablet did become lodged at the gastroesophageal junction. IMPRESSION: 1. Smooth narrowing of the gastroesophageal junction. This does not demonstrate the irregularity that I would expect of malignancy, but nevertheless may reflect some localized stricture and may warrant endoscopic assessment. 2. Distal longitudinal fold thickening in the esophagus which may indicate esophagitis. 3. Primary peristaltic waves were preserved in the esophagus on 3/4 swallows. 4. The pharyngeal phase was not assessed. 5. Due to limitations in patient mobility today's exam was performed entirely in the LPO position. Electronically Signed   By: Van Clines M.D.   On:  04/11/2021 10:18    Microbiology: Recent Results (from the past 240 hour(s))  Urine Culture     Status: None   Collection Time:  04/03/21  3:42 PM   Specimen: Urine  Result Value Ref Range Status   MICRO NUMBER: [id number]  Final   SPECIMEN QUALITY: Adequate  Final   Sample Source URINE  Final   STATUS: FINAL  Final   Result:   Final    Mixed genital flora isolated. These superficial bacteria are not indicative of a urinary tract infection. No further organism identification is warranted on this specimen. If clinically indicated, recollect clean-catch, mid-stream urine and transfer  immediately to Urine Culture Transport Tube.   Resp Panel by RT-PCR (Flu A&B, Covid) Nasopharyngeal Swab     Status: None   Collection Time: 04/07/21  1:23 PM   Specimen: Nasopharyngeal Swab; Nasopharyngeal(NP) swabs in vial transport medium  Result Value Ref Range Status   SARS Coronavirus 2 by RT PCR NEGATIVE NEGATIVE Final    Comment: (NOTE) SARS-CoV-2 target nucleic acids are NOT DETECTED.  The SARS-CoV-2 RNA is generally detectable in upper respiratory specimens during the acute phase of infection. The lowest concentration of SARS-CoV-2 viral copies this assay can detect is 138 copies/mL. A negative result does not preclude SARS-Cov-2 infection and should not be used as the sole basis for treatment or other patient management decisions. A negative result may occur with  improper specimen collection/handling, submission of specimen other than nasopharyngeal swab, presence of viral mutation(s) within the areas targeted by this assay, and inadequate number of viral copies(<138 copies/mL). A negative result must be combined with clinical observations, patient history, and epidemiological information. The expected result is Negative.  Fact Sheet for Patients:  EntrepreneurPulse.com.au  Fact Sheet for Healthcare Providers:  IncredibleEmployment.be  This test is no  t yet approved or cleared by the [place] FDA and  has been authorized for detection and/or diagnosis of SARS-CoV-2 by FDA under an Emergency Use Authorization (EUA). This EUA will remain  in effect (meaning this test can be used) for the duration of the COVID-19 declaration under Section 564(b)(1) of the Act, 21 U.S.C.section 360bbb-3(b)(1), unless the authorization is terminated  or revoked sooner.       Influenza A by PCR NEGATIVE NEGATIVE Final   Influenza B by PCR NEGATIVE NEGATIVE Final    Comment: (NOTE) The Xpert Xpress SARS-CoV-2/FLU/RSV plus assay is intended as an aid in the diagnosis of influenza from Nasopharyngeal swab specimens and should not be used as a sole basis for treatment. Nasal washings and aspirates are unacceptable for Xpert Xpress SARS-CoV-2/FLU/RSV testing.  Fact Sheet for Patients: EntrepreneurPulse.com.au  Fact Sheet for Healthcare Providers: IncredibleEmployment.be  This test is not yet approved or cleared by the [place] FDA and has been authorized for detection and/or diagnosis of SARS-CoV-2 by FDA under an Emergency Use Authorization (EUA). This EUA will remain in effect (meaning this test can be used) for the duration of the COVID-19 declaration under Section 564(b)(1) of the Act, 21 U.S.C. section 360bbb-3(b)(1), unless the authorization is terminated or revoked.  Performed at [facility], [place] [address] [place], [place] [address]   Blood Culture (routine x 2)     Status: None (Preliminary result)   Collection Time: 04/07/21  1:23 PM   Specimen: BLOOD  Result Value Ref Range Status   Specimen Description   Final    BLOOD RIGHT ANTECUBITAL Performed at [facility] [address], [place], [place] [address]    Special Requests   Final    BOTTLES DRAWN AEROBIC AND ANAEROBIC Blood Culture results may not be optimal due to  an inadequate volume of blood  received in culture bottles Performed at [facility] [address], [place], [place] [address]    Culture   Final    NO GROWTH 4 DAYS Performed at [facility], [place] [address], [place], [place] [address]    Report Status PENDING  Incomplete  Urine Culture     Status: None   Collection Time: 04/07/21  1:23 PM   Specimen: Urine, Clean Catch  Result Value Ref Range Status   Specimen Description   Final    URINE, CLEAN CATCH Performed at [facility], [place] [address] [place], [place] [address]    Special Requests   Final    NONE Performed at [facility], [place] [address] [place], [place] [address]    Culture   Final    NO GROWTH Performed at [facility], [place] [address], [place], [place] [address]    Report Status 04/09/2021 FINAL  Final  Blood Culture (routine x 2)     Status: None (Preliminary result)   Collection Time: 04/07/21 10:01 PM   Specimen: BLOOD  Result Value Ref Range Status   Specimen Description   Final    BLOOD RIGHT ANTECUBITAL Performed at [facility] [address] [place], [place] [address]    Special Requests   Final    BOTTLES DRAWN AEROBIC AND ANAEROBIC Blood Culture adequate volume Performed at [facility] [address], [place], [place] [address]    Culture   Final    NO GROWTH 4 DAYS Performed at [facility], [place] [address], [place], [place] [address]    Report Status PENDING  Incomplete     Labs: Basic Metabolic Panel: Recent Labs  Lab 04/07/21 1323 04/08/21 0616 04/09/21 0601 04/10/21 0502 04/11/21 0525  NA 147* 141 146* 147* 143  K 3.9 3.8 4.4 4.7 4.2  CL 115* 107 115* 113* 106  CO2 26 29 23 29  32  GLUCOSE 67* 82 169* 74 93  BUN 14 11 15 15 19   CREATININE 0.83 0.76 1.10* 0.89 0.86  CALCIUM 8.8* 8.8* 9.0 8.5* 8.8*  MG 1.9  --  2.1  --   --   PHOS 2.7  --   --   --   --    Liver Function Tests: Recent Labs   Lab 04/07/21 1323  AST 29  ALT 43  ALKPHOS 68  BILITOT 0.5  PROT 6.8  ALBUMIN 3.1*   No results for input(s): LIPASE, AMYLASE in the last 168 hours. No results for input(s): AMMONIA in the last 168 hours. CBC: Recent Labs  Lab 04/07/21 1323 04/08/21 0616 04/09/21 0601 04/10/21 0502 04/11/21 0525  WBC 6.4 9.2 6.5 9.0 8.1  NEUTROABS 4.8  --   --  6.2  --   HGB 15.6* 10.6* 10.4* 10.2* 10.5*  HCT 50.1* 33.7* 33.2* 32.7* 33.2*  MCV 94.0 95.2 94.6 95.9 95.4  PLT 199 298 324 242 270   Cardiac Enzymes: No results for input(s): CKTOTAL, CKMB, CKMBINDEX, TROPONINI in the last 168 hours. BNP: BNP (last 3 results) No results for input(s): BNP in the last 8760 hours.  ProBNP (last 3 results) No results for input(s): PROBNP in the last 8760 hours.  CBG: Recent Labs  Lab 04/10/21 1230 04/10/21 1811 04/10/21 2357 04/11/21 0547 04/11/21 1134  GLUCAP 186* 147* 146* 90 130*       Signed:  Irine Seal MD.  [facility] 04/11/2021, 3:30 PM

## 2021-04-11 NOTE — Progress Notes (Signed)
SATURATION QUALIFICATIONS: (This note is used to comply with regulatory documentation for home oxygen)  Patient Saturations on Room Air at Rest = 90%  Patient Saturations on Room Air while Ambulating = 87%  Patient Saturations on 2 Liters of oxygen while Ambulating = 97%  Please briefly explain why patient needs home oxygen: Pt is very SOB when ambulating more than 10 ft.  Pt does desat after ambulating.

## 2021-04-11 NOTE — Consult Note (Addendum)
Consultation  Referring Provider: TRH/Thompson MD Primary Care Physician:  Sandrea Hughs, NP Primary Gastroenterologist:  Dr. Fuller Plan  Reason for Consultation: Abnormal barium swallow  HPI: Andrea Larson is a 75 y.o. female, known to Dr. Fuller Plan from prior colonoscopies who we are asked to see today regarding abnormal barium swallow.  Patient was admitted on 04/07/2021 after being brought in by family with intermittent confusion and hallucinations over the prior month.  She had developed some slurred speech and was brought to the ER.  MRI of the head was negative, chest x-ray negative, blood sugar was 58. She has history of hypertension, COPD, prior history of heavy EtOH use, dementia, and has had recent weight loss and failure to thrive.  She is being treated for metabolic encephalopathy and is currently on high-dose thiamine.  She has been given steroids during admission for COPD exacerbation. She was evaluated by speech pathology yesterday regarding dietary/consistency recommendations.  She did not demonstrate any overt aspiration.  She was noted to have a lot of belching during the exam raising question of underlying esophageal issues. Barium swallow was done today that showed a smooth narrowing at the GE junction, there was some distal longitudinal fold thickening consistent with esophagitis.  The primary peristaltic waves were preserved on 3 out of 4 swallows.  He has not had prior EGD.  Last colonoscopy done for follow-up of adenomatous colon polyps was done in 2016 with 3 small polyps removed and also noted moderate diverticulosis.  Path consistent with tubular adenomas.  Patient is eating her lunch currently, roast beef and mashed potatoes.  Her son says that this is the best that her appetite has been.  Patient says she does not have any difficulty swallowing and does not feel as if her food is getting stuck at any point.  Denies any problems with heartburn or indigestion.  No  abdominal pain.  Her son says the only thing that they have noticed is that she had been belching over the past few days since admission.  Had not noted that she had any difficulty swallowing or issues with food lodging at home.   Past Medical History:  Diagnosis Date   Allergic rhinitis    Allergic rhinitis, cause unspecified 09/21/2011   Aortic stenosis 08/23/2016   Arthritis    Asthma    Asthma 09/21/2011   Colon polyps    COPD (chronic obstructive pulmonary disease) (HCC)    mild    Coronary artery calcification 08/23/2016   Dementia (HCC)    Depression with anxiety    HTN (hypertension)    Hyperlipidemia    UTI (lower urinary tract infection)     Past Surgical History:  Procedure Laterality Date   DENTAL SURGERY     pilonidial cyst     TONSILLECTOMY      Prior to Admission medications   Medication Sig Start Date End Date Taking? Authorizing Provider  acetaminophen (TYLENOL) 325 MG tablet Take 650 mg by mouth every 6 (six) hours as needed for mild pain or headache.   Yes [provider]  amLODipine (NORVASC) 5 MG tablet Take 1 tablet (5 mg total) by mouth daily. 12/16/20 12/16/21 Yes Biagio Borg, MD  buPROPion (WELLBUTRIN XL) 150 MG 24 hr tablet Take 1 tablet (150 mg total) by mouth daily. 10/12/20  Yes Biagio Borg, MD  Cholecalciferol (THERA-D 2000) 50 MCG (2000 UT) TABS 1 tab by mouth once daily 12/16/20  Yes Biagio Borg, MD  clonazePAM (  KLONOPIN) 0.5 MG tablet Take 1 tablet (0.5 mg total) by mouth 2 (two) times daily as needed for anxiety. 12/16/20  Yes Biagio Borg, MD  donepezil (ARICEPT) 5 MG tablet Take 1 tablet (5 mg total) by mouth at bedtime. 04/03/21 05/03/21 Yes Ngetich, Dinah C, NP  FLUoxetine (PROZAC) 40 MG capsule TAKE (1) CAPSULE DAILY. 10/12/20  Yes Biagio Borg, MD  megestrol (MEGACE) 40 MG tablet Take 40 mg by mouth daily.   Yes [provider]  melatonin 5 MG TABS Take 5 mg by mouth at bedtime.   Yes [provider]  memantine  (NAMENDA) 10 MG tablet Take 1 tablet (10 mg total) by mouth 2 (two) times daily. 10/12/20  Yes Biagio Borg, MD  mirtazapine (REMERON) 7.5 MG tablet Take 1 tablet (7.5 mg total) by mouth at bedtime. 04/03/21  Yes Ngetich, Dinah C, NP  montelukast (SINGULAIR) 10 MG tablet Take 1 tablet (10 mg total) by mouth daily. 10/12/20  Yes Biagio Borg, MD  rosuvastatin (CRESTOR) 20 MG tablet Take 1 tablet (20 mg total) by mouth daily. 10/12/20  Yes Biagio Borg, MD  SPIRIVA RESPIMAT 2.5 MCG/ACT AERS USE 1 PUFF DAILY AS DIRECTED 01/02/21  Yes Biagio Borg, MD  TRELEGY ELLIPTA 200-62.5-25 MCG/INH AEPB INHALE 1 PUFF INTO THE LUNGS ONCE DAILY AS DIRECTED. 03/19/21  Yes Mannam, Praveen, MD  feeding supplement, ENSURE ENLIVE, (ENSURE ENLIVE) LIQD Take 237 mLs by mouth 3 (three) times daily between meals. Patient not taking: No sig reported 04/21/18   Biagio Borg, MD  levalbuterol [facility] HFA) 45 MCG/ACT inhaler USE 2 PUFFS EVERY 6 HOURS AS NEEDED FOR WHEEZING. **SHAKE WELL** Patient not taking: No sig reported 07/07/19   Mannam, Praveen, MD  levalbuterol (XOPENEX) 0.63 MG/3ML nebulizer solution USE 1 VIAL VIA NEBULIZER EVERY 6 HOURS AS NEEDED FOR WHEEZING OR SHORTNESS OF BREATH. Patient not taking: No sig reported 04/15/19   Mannam, Praveen, MD  Lidocaine (HM LIDOCAINE PATCH) 4 % PTCH Apply 1 patch topically every 12 (twelve) hours as needed. Patient not taking: No sig reported 02/26/21   Loni Beckwith, PA-C  Respiratory Therapy Supplies (FLUTTER) DEVI Use as directed 08/19/17   Marshell Garfinkel, MD  traMADol (ULTRAM) 50 MG tablet Take 1 tablet (50 mg total) by mouth every 6 (six) hours as needed. Patient not taking: No sig reported 03/08/21   Biagio Borg, MD    Current Facility-Administered Medications  Medication Dose Route Frequency Provider Last Rate Last Admin   acetaminophen (TYLENOL) tablet 650 mg  650 mg Oral Q6H PRN Etta Quill, DO   650 mg at 04/08/21 2055   Or   acetaminophen (TYLENOL)  suppository 650 mg  650 mg Rectal Q6H PRN Etta Quill, DO       budesonide (PULMICORT) nebulizer solution 0.5 mg  0.5 mg Nebulization BID Eugenie Filler, MD   0.5 mg at 04/11/21 0758   buPROPion (WELLBUTRIN XL) 24 hr tablet 150 mg  150 mg Oral Daily Eugenie Filler, MD   150 mg at 04/11/21 1112   clonazePAM (KLONOPIN) tablet 0.5 mg  0.5 mg Oral BID PRN Eugenie Filler, MD   0.5 mg at 04/09/21 5366   cyanocobalamin ((VITAMIN B-12)) injection 1,000 mcg  1,000 mcg Subcutaneous Daily Eugenie Filler, MD   1,000 mcg at 04/11/21 1124   dextrose 5 % solution   Intravenous Continuous Eugenie Filler, MD 75 mL/hr at 04/10/21 2029 New Bag at 04/10/21  2029   donepezil (ARICEPT) tablet 5 mg  5 mg Oral QHS Eugenie Filler, MD   5 mg at 04/10/21 2152   feeding supplement (ENSURE ENLIVE / ENSURE PLUS) liquid 237 mL  237 mL Oral TID BM Eugenie Filler, MD   237 mL at 04/11/21 1125   FLUoxetine (PROZAC) capsule 40 mg  40 mg Oral Daily Eugenie Filler, MD   40 mg at 04/11/21 1112   fluticasone (FLONASE) 50 MCG/ACT nasal spray 2 spray  2 spray Each Nare Daily Eugenie Filler, MD   2 spray at 98/33/82 5053   folic acid (FOLVITE) tablet 1 mg  1 mg Oral Daily Jennette Kettle M, DO   1 mg at 04/11/21 1113   guaiFENesin (MUCINEX) 12 hr tablet 1,200 mg  1,200 mg Oral BID Eugenie Filler, MD   1,200 mg at 04/11/21 1112   heparin injection 5,000 Units  5,000 Units Subcutaneous Q12H Etta Quill, DO   5,000 Units at 04/11/21 1112   hydrOXYzine (VISTARIL) injection 25 mg  25 mg Intramuscular Q6H PRN Lovey Newcomer T, NP   25 mg at 04/09/21 0413   ipratropium (ATROVENT) nebulizer solution 0.5 mg  0.5 mg Nebulization TID Eugenie Filler, MD   0.5 mg at 04/11/21 1358   levalbuterol (XOPENEX) nebulizer solution 0.63 mg  0.63 mg Nebulization Q8H PRN Lovey Newcomer T, NP   0.63 mg at 04/09/21 0417   levalbuterol (XOPENEX) nebulizer solution 0.63 mg  0.63 mg Nebulization TID Eugenie Filler,  MD   0.63 mg at 04/11/21 1358   loratadine (CLARITIN) tablet 10 mg  10 mg Oral Daily Eugenie Filler, MD   10 mg at 04/11/21 1112   memantine (NAMENDA) tablet 10 mg  10 mg Oral BID Eugenie Filler, MD   10 mg at 04/11/21 1112   mirtazapine (REMERON) tablet 7.5 mg  7.5 mg Oral QHS Eugenie Filler, MD   7.5 mg at 04/10/21 2152   multivitamin with minerals tablet 1 tablet  1 tablet Oral Daily Etta Quill, DO   1 tablet at 04/11/21 1112   ondansetron (ZOFRAN) tablet 4 mg  4 mg Oral Q6H PRN Etta Quill, DO       Or   ondansetron [facility]) injection 4 mg  4 mg Intravenous Q6H PRN Etta Quill, DO       pantoprazole (PROTONIX) EC tablet 40 mg  40 mg Oral BID AC Eugenie Filler, MD       predniSONE (DELTASONE) tablet 40 mg  40 mg Oral QAC breakfast Eugenie Filler, MD   40 mg at 04/11/21 1112   QUEtiapine (SEROQUEL) tablet 25 mg  25 mg Oral QHS Eugenie Filler, MD   25 mg at 04/10/21 2152    Allergies as of 04/07/2021 - Review Complete 04/07/2021  Allergen Reaction Noted   Lipitor [atorvastatin] Other (See Comments) 10/28/2012    Family History  Problem Relation Age of Onset   Alcohol abuse Other    Arthritis Other    Heart disease Other    Stroke Other    Hypertension Other    Hypertension Father    Stroke Father    Atrial fibrillation Brother    Other Mother        unsure of medical history   Colon cancer Neg Hx     Social History   Socioeconomic History   Marital status: Divorced    Spouse name: Not on file  Number of children: 3   Years of education: two years of college   Highest education level: Not on file  Occupational History   Occupation: Retired  Tobacco Use   Smoking status: Former    Packs/day: 0.75    Years: 55.00    Pack years: 41.25    Types: Cigarettes    Quit date: 04/18/2015    Years since quitting: 5.9   Smokeless tobacco: Never  Vaping Use   Vaping Use: Never used  Substance and Sexual Activity   Alcohol use: Not  Currently    Comment: wine couple times each week - daily at times   Drug use: Never   Sexual activity: Not on file  Other Topics Concern   Not on file  Social History Narrative   Lives with a friend.   Right-handed.   Caffeine use: one soda per day.   Social Determinants of Health   Financial Resource Strain: Not on file  Food Insecurity: Not on file  Transportation Needs: Not on file  Physical Activity: Not on file  Stress: Not on file  Social Connections: Not on file  Intimate Partner Violence: Not on file    Review of Systems: Pertinent positive and negative review of systems were noted in the above HPI section.  All other review of systems was otherwise negative.   Physical Exam: Vital signs in last 24 hours: Temp:  [97.3 F (36.3 C)-98.6 F (37 C)] 98.6 F (37 C) (10/03 2123) Pulse Rate:  [96-111] 102 (10/04 1236) Resp:  [15-18] 18 (10/03 2123) BP: (119-156)/(70-87) 156/87 (10/03 2123) SpO2:  [94 %-100 %] 98 % (10/04 1218) Last BM Date: 04/08/21 General:   Alert,  Well-developed, thin elderly white female pleasant and cooperative in NAD enjoying her solid food lunch Head:  Normocephalic and atraumatic. Eyes:  Sclera clear, no icterus.   Conjunctiva pink. Ears:  Normal auditory acuity. Nose:  No deformity, discharge,  or lesions. Mouth:  No deformity or lesions.   Neck:  Supple; no masses or thyromegaly. Lungs:  Clear throughout to auscultation.   No wheezes, crackles, or rhonchi.  Heart:  Regular rate and rhythm; no murmurs, clicks, rubs,  or gallops. Abdomen:  Soft,nontender, BS active,nonpalp mass or hsm.   Rectal: Not done Msk:  Symmetrical without gross deformities. . Pulses:  Normal pulses noted. Extremities:  Without clubbing or edema. Neurologic:  Alert and  oriented x3 grossly normal  Skin:  Intact without significant lesions or rashes.. Psych:  Alert and cooperative. Normal mood and affect.  Intake/Output from previous day: 10/03 0701 - 10/04  0700 In: 1225.9 [P.O.:600; I.V.:426.9; IV Piggyback:199.1] Out: 1700 [Urine:1700] Intake/Output this shift: No intake/output data recorded.  Lab Results: Recent Labs    04/09/21 0601 04/10/21 0502 04/11/21 0525  WBC 6.5 9.0 8.1  HGB 10.4* 10.2* 10.5*  HCT 33.2* 32.7* 33.2*  PLT 324 242 270   BMET Recent Labs    04/09/21 0601 04/10/21 0502 04/11/21 0525  NA 146* 147* 143  K 4.4 4.7 4.2  CL 115* 113* 106  CO2 23 29 32  GLUCOSE 169* 74 93  BUN 15 15 19   CREATININE 1.10* 0.89 0.86  CALCIUM 9.0 8.5* 8.8*     IMPRESSION:  77.  75 year old white female with mild smooth narrowing of the GE junction noted on barium swallow earlier today and some mildly thickened distal esophageal folds consistent with esophagitis. Patient may have an early distal stricture however she is asymptomatic.  She completely denies any  difficulty with dysphagia to solids or liquids.  Family unaware of any issues with solid food dysphagia at home.  2. dementia 3.  Metabolic encephalopathy improving 4.  Prior history of heavy EtOH use 5.  COPD with exacerbation-on O2 at home 6.  Hypertension 7. recent weight loss and failure to thrive probably multifactorial with dementia confusion etc.  She has been started on Megace and is currently eating well 8.  history of adenomatous polyps  Recommendations:  Start omeprazole 40 mg p.o. every morning AC breakfast and please discharge on omeprazole 40 mg every morning Discussed probable early esophageal stricture with the patient's son and another son per phone.  They are not interested in pursuing EGD or esophageal dilation at this time as she is asymptomatic and I agree.  They will monitor her for any signs of development of solid food dysphagia, and we are happy to see her as an outpatient as needed. Advise cutting food into small pieces, avoiding any large chunks of meat or dry breads and sipping liquids between bites. GI will sign off.   Amy Esterwood   PA-C10/10/2020, 2:10 PM     Attending Physician Note   I have taken a history, reviewed the chart and examined the patient. I performed a substantive portion of this encounter, including a complete performance of at least one of the key components, in conjunction with the APP. I agree with the APP's note, impression and recommendations.   Abnormal barium esophagram with a smooth distal stricture and mild distal mucosal thickening c/w esophagitis. Patient and her son do not relay problems with solid food dysphagia at home. After a discussion with the patient, 2 sons they do not want an EGD and given her overall situation plus benign appearing findings on esophagram we do not recommend an EGD. PPI qd long term. Small pieces of meat long term. GI signing off. Will be available to see her as needed as outpatient.   Lucio Edward, MD [facility] See AMION, [facility], for our on call provider

## 2021-04-11 NOTE — Progress Notes (Signed)
[place] [address] [employer] [facility]) Hospital Liaison Note  Notified by Jorene Guest of patient/family request for [facility] Palliative services at home after discharge.   [place] liaison will follow patient for discharge disposition.   Please call with any Hospice/Palliative related questions or concerns.   Thank you for the opportunity to participate in this patient's care  Jhonnie Garner RN, BSN, [facility] [phone]
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
## 2021-04-12 ENCOUNTER — Telehealth: Payer: Self-pay

## 2021-04-12 LAB — CULTURE, BLOOD (ROUTINE X 2)
Culture: NO GROWTH
Culture: NO GROWTH
Special Requests: ADEQUATE

## 2021-04-12 LAB — VITAMIN B1: Vitamin B1 (Thiamine): 298.2 nmol/L — ABNORMAL HIGH (ref 66.5–200.0)

## 2021-04-12 NOTE — Telephone Encounter (Signed)
Attempted to contact patient's son Gwyndolyn Saxon to schedule a Palliative Care consult appointment. No answer left a message to return call.

## 2021-04-13 ENCOUNTER — Telehealth: Payer: Self-pay | Admitting: *Deleted

## 2021-04-13 DIAGNOSIS — J438 Other emphysema: Secondary | ICD-10-CM | POA: Diagnosis not present

## 2021-04-13 DIAGNOSIS — M1990 Unspecified osteoarthritis, unspecified site: Secondary | ICD-10-CM | POA: Diagnosis not present

## 2021-04-13 DIAGNOSIS — R1310 Dysphagia, unspecified: Secondary | ICD-10-CM | POA: Diagnosis not present

## 2021-04-13 DIAGNOSIS — J441 Chronic obstructive pulmonary disease with (acute) exacerbation: Secondary | ICD-10-CM | POA: Diagnosis not present

## 2021-04-13 DIAGNOSIS — I10 Essential (primary) hypertension: Secondary | ICD-10-CM | POA: Diagnosis not present

## 2021-04-13 DIAGNOSIS — I2510 Atherosclerotic heart disease of native coronary artery without angina pectoris: Secondary | ICD-10-CM | POA: Diagnosis not present

## 2021-04-13 DIAGNOSIS — G9341 Metabolic encephalopathy: Secondary | ICD-10-CM | POA: Diagnosis not present

## 2021-04-13 DIAGNOSIS — I350 Nonrheumatic aortic (valve) stenosis: Secondary | ICD-10-CM | POA: Diagnosis not present

## 2021-04-13 DIAGNOSIS — F03911 Unspecified dementia, unspecified severity, with agitation: Secondary | ICD-10-CM | POA: Diagnosis not present

## 2021-04-13 DIAGNOSIS — J449 Chronic obstructive pulmonary disease, unspecified: Secondary | ICD-10-CM | POA: Diagnosis not present

## 2021-04-13 DIAGNOSIS — E860 Dehydration: Secondary | ICD-10-CM | POA: Diagnosis not present

## 2021-04-13 NOTE — Telephone Encounter (Signed)
Transition Care Management Follow-up Telephone Call Date of discharge and from where: 04/11/2021 [place] How have you been since you were released from the hospital? Great. On steroids that are helping patient to eat and had a lot of rest in the hospital. Son is not sure how long with will last though. Patient is transitioning into Palliative Care.  Any questions or concerns? No  Items Reviewed: Did the pt receive and understand the discharge instructions provided? Yes  Medications obtained and verified? Yes  Other? No  Any new allergies since your discharge? No  Dietary orders reviewed? Yes Do you have support at home? Yes   Home Care and Equipment/Supplies: Were home health services ordered? Yes  If so, what is the name of the agency? [facility]  Has the agency set up a time to come to the patient's home? yes Were any new equipment or medical supplies ordered?  No What is the name of the medical supply agency? na Were you able to get the supplies/equipment? not applicable Do you have any questions related to the use of the equipment or supplies? No  Functional Questionnaire: (I = Independent and D = Dependent) ADLs: I with assistance  Bathing/Dressing- I with assistance  Meal Prep- D  Eating- I  Maintaining continence- I  Transferring/Ambulation- I with assistance   Managing Meds- D  Follow up appointments reviewed:  PCP Hospital f/u appt confirmed? Yes  Scheduled to see Dinah on 04/17/21 @ 3:15. [facility] f/u appt confirmed? Yes  Scheduled to see Dr. Fuller Plan on 11/15 @ 2:10. Are transportation arrangements needed? No  If their condition worsens, is the pt aware to call PCP or go to the Emergency Dept.? Yes Was the patient provided with contact information for the PCP's office or ED? Yes Was to pt encouraged to call back with questions or concerns? Yes

## 2021-04-15 DIAGNOSIS — E860 Dehydration: Secondary | ICD-10-CM | POA: Diagnosis not present

## 2021-04-15 DIAGNOSIS — J441 Chronic obstructive pulmonary disease with (acute) exacerbation: Secondary | ICD-10-CM | POA: Diagnosis not present

## 2021-04-15 DIAGNOSIS — F03911 Unspecified dementia, unspecified severity, with agitation: Secondary | ICD-10-CM | POA: Diagnosis not present

## 2021-04-15 DIAGNOSIS — I10 Essential (primary) hypertension: Secondary | ICD-10-CM | POA: Diagnosis not present

## 2021-04-15 DIAGNOSIS — R1310 Dysphagia, unspecified: Secondary | ICD-10-CM | POA: Diagnosis not present

## 2021-04-15 DIAGNOSIS — I2510 Atherosclerotic heart disease of native coronary artery without angina pectoris: Secondary | ICD-10-CM | POA: Diagnosis not present

## 2021-04-15 DIAGNOSIS — M1990 Unspecified osteoarthritis, unspecified site: Secondary | ICD-10-CM | POA: Diagnosis not present

## 2021-04-15 DIAGNOSIS — G9341 Metabolic encephalopathy: Secondary | ICD-10-CM | POA: Diagnosis not present

## 2021-04-15 DIAGNOSIS — I350 Nonrheumatic aortic (valve) stenosis: Secondary | ICD-10-CM | POA: Diagnosis not present

## 2021-04-17 ENCOUNTER — Encounter: Payer: Self-pay | Admitting: Family

## 2021-04-17 ENCOUNTER — Other Ambulatory Visit: Payer: Self-pay

## 2021-04-17 ENCOUNTER — Ambulatory Visit (INDEPENDENT_AMBULATORY_CARE_PROVIDER_SITE_OTHER): Payer: Medicare HMO | Admitting: Family

## 2021-04-17 VITALS — BP 120/80 | HR 104 | Temp 97.5°F | Resp 16 | Ht 64.0 in | Wt 95.0 lb

## 2021-04-17 DIAGNOSIS — R627 Adult failure to thrive: Secondary | ICD-10-CM | POA: Diagnosis not present

## 2021-04-17 DIAGNOSIS — I10 Essential (primary) hypertension: Secondary | ICD-10-CM

## 2021-04-17 DIAGNOSIS — E870 Hyperosmolality and hypernatremia: Secondary | ICD-10-CM

## 2021-04-17 DIAGNOSIS — E7800 Pure hypercholesterolemia, unspecified: Secondary | ICD-10-CM | POA: Diagnosis not present

## 2021-04-17 DIAGNOSIS — G9341 Metabolic encephalopathy: Secondary | ICD-10-CM | POA: Diagnosis not present

## 2021-04-17 DIAGNOSIS — R933 Abnormal findings on diagnostic imaging of other parts of digestive tract: Secondary | ICD-10-CM | POA: Diagnosis not present

## 2021-04-17 DIAGNOSIS — J9611 Chronic respiratory failure with hypoxia: Secondary | ICD-10-CM

## 2021-04-17 DIAGNOSIS — F03918 Unspecified dementia, unspecified severity, with other behavioral disturbance: Secondary | ICD-10-CM

## 2021-04-17 DIAGNOSIS — D509 Iron deficiency anemia, unspecified: Secondary | ICD-10-CM | POA: Diagnosis not present

## 2021-04-17 DIAGNOSIS — E538 Deficiency of other specified B group vitamins: Secondary | ICD-10-CM

## 2021-04-17 DIAGNOSIS — F0391 Unspecified dementia with behavioral disturbance: Secondary | ICD-10-CM | POA: Diagnosis not present

## 2021-04-17 MED ORDER — THIAMINE HCL 100 MG PO TABS: 100.0000 mg | ORAL_TABLET | Freq: Every day | ORAL | Status: DC

## 2021-04-17 MED ORDER — FOLIC ACID 1 MG PO TABS: 1.0000 mg | ORAL_TABLET | Freq: Every day | ORAL | Status: DC

## 2021-04-17 NOTE — Progress Notes (Signed)
Provider: Marlowe Sax FNP-C  Greydon Betke, Nelda Bucks, NP  Patient Care Team: Jemell Town, Nelda Bucks, NP as PCP - General (Family Medicine) Skeet Latch, MD as PCP - Cardiology (Cardiology)  Extended Emergency Contact Information Primary Emergency Contact: [facility] Address: [address]          [place],  [address] Johnnette Litter of [place] Work Phone: [phone] Mobile Phone: [phone] Relation: Son Secondary Emergency Contact: [facility] Phone: [phone] Relation: Brother  Code Status:  Full Code  Goals of care: Advanced Directive information Advanced Directives 04/17/2021  Does Patient Have a Medical Advance Directive? Yes  Type of Advance Directive Living will  Does patient want to make changes to medical advance directive? No - Patient declined  Copy of Healthcare Power of Attorney in Chart? -  Would patient like information on creating a medical advance directive? -     Chief Complaint  Patient presents with   Transitions Of Care    [facility] 04/07/21-04/11/2021    HPI:  Pt is a 75 y.o. female seen today for an acute visit for Transition of care post hospital admission from 04/07/2021 - 04/11/2021 for acute metabolic encephalopathy,mild acute COPD exacerbation.Her sodium level was high  147 had D 5-with ND with much improvement sodium down to 143.Oral intake improved during hospitalization.Had intermittent hallucination and confusion on admission which was thought to be multifactorial secondary to dehydration,mild hypoglycemia,COPD exacerbation and possible sundowning due to dementia.MRI of the brain was negative for acute abnormalities.she was started on Seroquel 25 mg tablet at bedtime which helped with her agitation.Advised to follow up outpatient with palliative care.   Had poor air movement,tightness and expiratory wheezing.she was treated with steroid taper,Pulmicort,Nebs,Flonase,Claritin,PPI and Mucinex with much  improvement.advised to follow up with pulmonologist Dr.Mannam.Discharged on portable oxygen.Tank previously order when she established Care here at the practice.  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  Her Vitamin B 12 levels low at 313 was placed on vitamin B 12 supplementation SQ daily and discharged on oral Vitamin B 12 to follow up with PCP.   Blood pressure was stable so antihypertensive medication was discontinued.she was previously on Amlodipine 5 mg tablet daily.   She was seen by speech Therapist during hospital admission recommended a barium esophagram which was done on 04/11/2021 which showed a smooth narrowing of the GE junction with no irregularity noted.Also had distal longitudinal full-thickness in the esophagus which was thought may indicate esophagitis,primary peristaltic waves preserved in the esophagus on 3/4 swallows.Her pharyngeal phase was not assessed.GI was consulted was seen by Dr.Stark.Per son no prior issues swallowing food.Declined EGD given her condition and benign-appearing findings.PPI was recommended daily and to follow up with outpatient GI as needed.  She denies any acute issues today.states feeling much better. Hospital labs/imaging reviewed and discussed with patient and son. Medication reconciled.  Her Blood pressure remains stable this visit.advised to continue to monitor at home then notify provider if elevated will restart her Amlodipine 5 mg tablet daily.   Past Medical History:  Diagnosis Date   Allergic rhinitis    Allergic rhinitis, cause unspecified 09/21/2011   Aortic stenosis 08/23/2016   Arthritis    Asthma    Asthma 09/21/2011   Colon polyps    COPD (chronic obstructive pulmonary disease) (HCC)    mild    Coronary artery calcification 08/23/2016   Dementia ([place])    Depression  with anxiety    HTN (hypertension)    Hyperlipidemia    UTI (lower urinary tract infection)     Past Surgical History:  Procedure Laterality Date   DENTAL SURGERY     pilonidial cyst     TONSILLECTOMY      Allergies  Allergen Reactions   Lipitor [Atorvastatin] Other (See Comments)    Myalgias    Outpatient Encounter Medications as of 04/17/2021  Medication Sig   acetaminophen (TYLENOL) 325 MG tablet Take 650 mg by mouth every 6 (six) hours as needed for mild pain or headache.   amLODipine (NORVASC) 5 MG tablet Take 1 tablet (5 mg total) by mouth daily.   buPROPion (WELLBUTRIN XL) 150 MG 24 hr tablet Take 1 tablet (150 mg total) by mouth daily.   Cholecalciferol (THERA-D 2000) 50 MCG (2000 UT) TABS 1 tab by mouth once daily   clonazePAM (KLONOPIN) 0.5 MG tablet Take 1 tablet (0.5 mg total) by mouth 2 (two) times daily as needed for anxiety.   donepezil (ARICEPT) 5 MG tablet Take 1 tablet (5 mg total) by mouth at bedtime.   FLUoxetine (PROZAC) 40 MG capsule TAKE (1) CAPSULE DAILY.   fluticasone (FLONASE) 50 MCG/ACT nasal spray Place 2 sprays into both nostrils daily.   folic acid (FOLVITE) 1 MG tablet Take 1 tablet (1 mg total) by mouth daily.   ipratropium (ATROVENT) 0.02 % nebulizer solution Take 2.5 mLs (0.5 mg total) by nebulization 3 (three) times daily.   levalbuterol (XOPENEX) 0.63 MG/3ML nebulizer solution Take 3 mLs (0.63 mg total) by nebulization in the morning, at noon, and at bedtime. USE 1 VIAL VIA NEBULIZER EVERY 6 HOURS AS NEEDED FOR WHEEZING OR SHORTNESS OF BREATH.   memantine (NAMENDA) 10 MG tablet Take 1 tablet (10 mg total) by mouth 2 (two) times daily.   mirtazapine (REMERON) 7.5 MG tablet Take 1 tablet (7.5 mg total) by mouth at bedtime.   montelukast (SINGULAIR) 10 MG tablet Take 1 tablet (10 mg total) by mouth daily.   Multiple Vitamin (MULTIVITAMIN WITH MINERALS) TABS tablet Take 1 tablet by mouth daily.   omeprazole (PRILOSEC OTC) 20 MG tablet Take 2 tablets (40 mg total) by mouth daily.   predniSONE (DELTASONE) 10 MG tablet Take 4 tablets (40 mg total)  by mouth daily before breakfast for 2 days, THEN 3 tablets (30 mg total) daily before breakfast for 4 days, THEN 2 tablets (20 mg total) daily before breakfast for 4 days, THEN 1 tablet (10 mg total) daily before breakfast for 4 days.   QUEtiapine (SEROQUEL) 25 MG tablet Take 1 tablet (25 mg total) by mouth at bedtime.   Respiratory Therapy Supplies (FLUTTER) DEVI Use as directed   rosuvastatin (CRESTOR) 20 MG tablet Take 1 tablet (20 mg total) by mouth daily.   thiamine 100 MG tablet Take 1 tablet (100 mg total) by mouth daily.   TRELEGY ELLIPTA 200-62.5-25 MCG/INH AEPB INHALE 1 PUFF INTO THE LUNGS ONCE DAILY AS DIRECTED.   vitamin B-12 (CYANOCOBALAMIN) 1000 MCG tablet Take 1 tablet (1,000 mcg total) by mouth daily.   [DISCONTINUED] feeding supplement, ENSURE ENLIVE, (ENSURE ENLIVE) LIQD Take 237 mLs by mouth 3 (three) times daily between meals. (Patient not taking: No sig reported)   [DISCONTINUED] levalbuterol (XOPENEX HFA) 45 MCG/ACT inhaler USE 2 PUFFS EVERY 6 HOURS AS NEEDED FOR WHEEZING. **SHAKE WELL** (Patient not taking: No sig reported)   [DISCONTINUED] traMADol (ULTRAM) 50 MG tablet Take 1 tablet (50 mg total) by mouth every 6 (  six) hours as needed. (Patient not taking: No sig reported)   No facility-administered encounter medications on file as of 04/17/2021.    Review of Systems  Constitutional:  Negative for appetite change, chills, fatigue, fever and unexpected weight change.  HENT:  Positive for hearing loss. Negative for congestion, dental problem, ear discharge, ear pain, facial swelling, nosebleeds, postnasal drip, rhinorrhea, sinus pressure, sinus pain, sneezing, sore throat, tinnitus and trouble swallowing.   Eyes:  Negative for pain, discharge, redness, itching and visual disturbance.  Respiratory:  Negative for cough, chest tightness, shortness of breath and wheezing.   Cardiovascular:  Negative for chest pain, palpitations and leg swelling.  Gastrointestinal:  Negative for  abdominal distention, abdominal pain, blood in stool, constipation, diarrhea, nausea and vomiting.  Endocrine: Negative for cold intolerance, heat intolerance, polydipsia, polyphagia and polyuria.  Genitourinary:  Negative for difficulty urinating, dysuria, flank pain, frequency and urgency.  Musculoskeletal:  Positive for arthralgias and gait problem. Negative for back pain, joint swelling, myalgias, neck pain and neck stiffness.       Right shoulder   Skin:  Negative for color change, pallor, rash and wound.  Neurological:  Negative for dizziness, syncope, speech difficulty, weakness, light-headedness, numbness and headaches.  Hematological:  Does not bruise/bleed easily.  Psychiatric/Behavioral:  Negative for agitation, behavioral problems, confusion, hallucinations, self-injury, sleep disturbance and suicidal ideas. The patient is not nervous/anxious.    Immunization History  Administered Date(s) Administered   Fluad Quad(high Dose 65+) 04/15/2019, 04/05/2020   Influenza Split 04/11/2011   Influenza Whole 03/31/2008   Influenza, High Dose Seasonal PF 04/29/2013, 03/18/2018   Influenza,inj,Quad PF,6+ Mos 05/07/2014   Influenza-Unspecified 05/13/2015, 04/19/2017   PFIZER(Purple Top)SARS-COV-2 Vaccination 08/01/2019, 08/23/2019, 04/25/2020   Pneumococcal Conjugate-13 05/13/2013   Pneumococcal Polysaccharide-23 03/31/2008, 08/19/2017   Td 02/10/2008   Tdap 12/18/2015, 09/22/2020, 02/26/2021   Zoster Recombinat (Shingrix) 01/25/2017   Zoster, Live 07/09/2005, 01/24/2006   Pertinent  Health Maintenance Due  Topic Date Due   COLONOSCOPY (Pts 45-56yr Insurance coverage will need to be confirmed)  04/19/2020   INFLUENZA VACCINE  02/06/2021   MAMMOGRAM  05/19/2021   DEXA SCAN  Completed   Fall Risk  04/17/2021 04/03/2021 10/12/2020 10/12/2020 04/05/2020  Falls in the past year? 0 1 1 1  0  Number falls in past yr: 0 1 0 1 -  Injury with Fall? 0 1 0 1 -  Comment - - - - -  Risk for fall due  to : No Fall Risks History of fall(s) - - -  Follow up Falls evaluation completed Falls evaluation completed - - -   Functional Status Survey:    Vitals:   04/17/21 1545  BP: 120/80  Pulse: (!) 104  Resp: 16  Temp: (!) 97.5 F (36.4 C)  SpO2: 94%  Weight: 95 lb (43.1 kg)  Height: 5' 4"  (1.626 m)   Body mass index is 16.31 kg/m. Physical Exam Vitals reviewed.  Constitutional:      General: She is not in acute distress.    Appearance: Normal appearance. She is normal weight. She is not ill-appearing or diaphoretic.  HENT:     Head: Normocephalic.     Nose: Nose normal. No congestion or rhinorrhea.     Mouth/Throat:     Mouth: Mucous membranes are moist.     Pharynx: Oropharynx is clear. No oropharyngeal exudate or posterior oropharyngeal erythema.  Eyes:     General: No scleral icterus.       Right eye: No discharge.  Left eye: No discharge.     Extraocular Movements: Extraocular movements intact.     Conjunctiva/sclera: Conjunctivae normal.     Pupils: Pupils are equal, round, and reactive to light.  Neck:     Vascular: No carotid bruit.  Cardiovascular:     Rate and Rhythm: Normal rate and regular rhythm.     Pulses: Normal pulses.     Heart sounds: Normal heart sounds. No murmur heard.   No friction rub. No gallop.  Pulmonary:     Effort: Pulmonary effort is normal. No respiratory distress.     Breath sounds: Normal breath sounds. No wheezing, rhonchi or rales.  Chest:     Chest wall: No tenderness.  Abdominal:     General: Bowel sounds are normal. There is no distension.     Palpations: Abdomen is soft. There is no mass.     Tenderness: There is no abdominal tenderness. There is no right CVA tenderness, left CVA tenderness, guarding or rebound.  Musculoskeletal:        General: No swelling or tenderness.     Right shoulder: No swelling, effusion, tenderness or crepitus. Decreased range of motion. Normal strength. Normal pulse.     Left shoulder: Normal.      Cervical back: Normal range of motion. No rigidity or tenderness.     Right lower leg: No edema.     Left lower leg: No edema.  Lymphadenopathy:     Cervical: No cervical adenopathy.  Skin:    General: Skin is warm and dry.     Coloration: Skin is not pale.     Findings: No bruising, erythema, lesion or rash.  Neurological:     Mental Status: She is alert. Mental status is at baseline.     Cranial Nerves: No cranial nerve deficit.     Sensory: No sensory deficit.     Motor: No weakness.     Coordination: Coordination normal.     Gait: Gait abnormal.  Psychiatric:        Mood and Affect: Mood normal.        Speech: Speech normal.        Behavior: Behavior normal.        Thought Content: Thought content normal.        Cognition and Memory: Memory is impaired.        Judgment: Judgment normal.    Labs reviewed: Recent Labs    04/07/21 1323 04/08/21 0616 04/09/21 0601 04/10/21 0502 04/11/21 0525  NA 147*   < > 146* 147* 143  K 3.9   < > 4.4 4.7 4.2  CL 115*   < > 115* 113* 106  CO2 26   < > 23 29 32  GLUCOSE 67*   < > 169* 74 93  BUN 14   < > 15 15 19   CREATININE 0.83   < > 1.10* 0.89 0.86  CALCIUM 8.8*   < > 9.0 8.5* 8.8*  MG 1.9  --  2.1  --   --   PHOS 2.7  --   --   --   --    < > = values in this interval not displayed.   Recent Labs    06/03/20 2011 10/12/20 1617 04/07/21 1323  AST 16 29 29   ALT 12 24 43  ALKPHOS 57 102 68  BILITOT 0.7 0.2 0.5  PROT 6.9 6.9 6.8  ALBUMIN 2.8* 3.7 3.1*   Recent Labs    10/12/20 1617  04/07/21 1323 04/08/21 0616 04/09/21 0601 04/10/21 0502 04/11/21 0525  WBC 6.3 6.4   < > 6.5 9.0 8.1  NEUTROABS 4.0 4.8  --   --  6.2  --   HGB 13.0 15.6*   < > 10.4* 10.2* 10.5*  HCT 39.2 50.1*   < > 33.2* 32.7* 33.2*  MCV 94.4 94.0   < > 94.6 95.9 95.4  PLT 415.0* 199   < > 324 242 270   < > = values in this interval not displayed.   Lab Results  Component Value Date   TSH 1.03 10/12/2020   Lab Results  Component Value  Date   HGBA1C 4.9 04/05/2020   Lab Results  Component Value Date   CHOL 137 10/12/2020   HDL 52.30 10/12/2020   LDLCALC 59 10/12/2020   LDLDIRECT 124.8 09/21/2011   TRIG 128.0 10/12/2020   CHOLHDL 3 10/12/2020    Significant Diagnostic Results in last 30 days:  DG Chest 1 View  Result Date: 04/09/2021 CLINICAL DATA:  Shortness of breath.  History of asthma. EXAM: CHEST  1 VIEW COMPARISON:  April 07, 2021 FINDINGS: The heart size and mediastinal contours are within normal limits. Both lungs are clear. The visualized skeletal structures are unremarkable. IMPRESSION: No active disease. Electronically Signed   By: Dorise Bullion III M.D.   On: 04/09/2021 05:05   CT Head Wo Contrast  Result Date: 04/07/2021 CLINICAL DATA:  Altered mental status Dementia Possible UTI EXAM: CT HEAD WITHOUT CONTRAST TECHNIQUE: Contiguous axial images were obtained from the base of the skull through the vertex without intravenous contrast. COMPARISON:  CT head 02/26/2021 FINDINGS: Brain: No evidence of acute infarction, hemorrhage, hydrocephalus, extra-axial collection or mass lesion/mass effect. Vascular: No hyperdense vessel or unexpected calcification. Skull: Normal. Negative for fracture or focal lesion. Sinuses/Orbits: Partial opacification of the left sphenoid sinus. Other: None. IMPRESSION: No acute intracranial abnormality. Electronically Signed   By: Miachel Roux M.D.   On: 04/07/2021 16:21   MR BRAIN WO CONTRAST  Result Date: 04/08/2021 CLINICAL DATA:  Encephalopathy EXAM: MRI HEAD WITHOUT CONTRAST TECHNIQUE: Multiplanar, multiecho pulse sequences of the brain and surrounding structures were obtained without intravenous contrast. COMPARISON:  06/04/2020 FINDINGS: Brain: No acute infarct, mass effect or extra-axial collection. Focus of chronic microhemorrhage right frontal lobe. No acute hemorrhage. There is multifocal hyperintense T2-weighted signal within the white matter. Generalized volume loss  without a clear lobar predilection. The midline structures are normal. Vascular: Major flow voids are preserved. Skull and upper cervical spine: Normal calvarium and skull base. Visualized upper cervical spine and soft tissues are normal. Sinuses/Orbits:No paranasal sinus fluid levels or advanced mucosal thickening. No mastoid or middle ear effusion. Normal orbits. IMPRESSION: 1. No acute intracranial abnormality. 2. Generalized volume loss and findings of chronic small vessel disease. Electronically Signed   By: Ulyses Jarred M.D.   On: 04/08/2021 00:42   DG Chest Port 1 View  Result Date: 04/07/2021 CLINICAL DATA:  Questionable sepsis - evaluate for abnormality EXAM: PORTABLE CHEST 1 VIEW COMPARISON:  02/26/2021. FINDINGS: The heart size and mediastinal contours are within normal limits. Both lungs are clear. No visible pleural effusions or pneumothorax. Old rib fractures better seen on the prior. IMPRESSION: No acute cardiopulmonary disease. Electronically Signed   By: Margaretha Sheffield M.D.   On: 04/07/2021 13:27   DG ESOPHAGUS W SINGLE CM (SOL OR THIN BA)  Result Date: 04/11/2021 CLINICAL DATA:  Dysphagia, silent aspiration EXAM: ESOPHOGRAM/BARIUM SWALLOW TECHNIQUE: Single contrast examination was  performed using  thin barium. FLUOROSCOPY TIME:  Fluoroscopy Time:  2 minutes, 24 seconds Radiation Exposure Index (if provided by the fluoroscopic device): 13.5 mGy Number of Acquired Spot Images: 0 COMPARISON:  Chest CT 09/23/2020 FINDINGS: The patient has reduced ability to stand or turn, and accordingly today's exam was performed exclusively in the LPO position. Primary peristaltic waves were intact on 3/4 swallows. Mild distal esophageal fold thickening which could reflect esophagitis. There is smooth narrowing of the distal esophagus in the vicinity of the gastroesophageal junction along the hiatus, I was not able to distend this area more than about 8 mm in diameter. The 13 mm barium tablet did become  lodged at the gastroesophageal junction. IMPRESSION: 1. Smooth narrowing of the gastroesophageal junction. This does not demonstrate the irregularity that I would expect of malignancy, but nevertheless may reflect some localized stricture and may warrant endoscopic assessment. 2. Distal longitudinal fold thickening in the esophagus which may indicate esophagitis. 3. Primary peristaltic waves were preserved in the esophagus on 3/4 swallows. 4. The pharyngeal phase was not assessed. 5. Due to limitations in patient mobility today's exam was performed entirely in the LPO position. Electronically Signed   By: Van Clines M.D.   On: 04/11/2021 10:18    Assessment/Plan  1. Chronic hypoxemic respiratory failure (HCC) Status post Hospitalization as above with mild COPD exacerbation.  Breathing has improved   - continue on Ipratropium, Xopenex ,Trelegy Ellipta ,montelukast,Flonase and Taper Prednisone.  - CBC with Differential/Platelet - BMP with eGFR([facility])  2. Essential hypertension B/p well controlled.Amlodipine d/C during Hospital admission. - advised to check B/p at home and notify provider if > 140/90  - CBC with Differential/Platelet - BMP with eGFR([facility])  3. Iron deficiency anemia, unspecified iron deficiency anemia type Hgb 10.2 ; 10.5 on discharge. Asymptomatic.will recheck CBC - CBC with Differential/Platelet  4. Failure to thrive in adult Weight stable today. Continue on Protein supplement  - follow up with Palliative Care as ordered on hospital discharge.  5. Dementia with behavioral disturbance Hallucination has improved. - continue on Seroquel,donepezil and Memantine  - continue with supportive care   6. Acute metabolic encephalopathy Has improved. Continue on Seroquel  7. Pure hypercholesterolemia Continue on Rosuvastatin   8. Vitamin B12 deficiency Vitamin B 12 level was low 333 at the hospital.Had Vitamin B 12 SQ injection then discharged on oral vit B 12 1000  mcg tablet. - will recheck vitamin B 12 with next labs  9. Abnormal barium swallow Evaluated by Speech therapist during Hospitalization.barium esophagram was done on 04/11/2021 which showed a smooth narrowing of the GE junction with no irregularity noted.Also had distal longitudinal full-thickness in the esophagus which was thought may indicate esophagitis,primary peristaltic waves preserved in the esophagus on 3/4 swallows.Her pharyngeal phase was not assessed.GI was consulted was seen by Dr.Stark.Per son no prior issues swallowing food.Declined EGD given her condition and benign-appearing findings.PPI was recommended daily and to follow up with outpatient GI as needed.  - continue on Omeprazole 40 mg tablet daily   10. Hypernatremia Na+ 147 went down to 143 on discharge. Will recheck BMP today   Family/ staff Communication: Reviewed plan of care with patient and son verbalized understanding   Labs/tests ordered:  - CBC with Differential/Platelet - BMP with eGFR([facility])  Next Appointment: 4 months for medical management of chronic issues.  Sandrea Hughs, NP

## 2021-04-17 NOTE — Telephone Encounter (Addendum)
Andrea Larson with [facility] called needing verbal orders. Verbal orders for  1 time a week for 6 weeks for disease and medication management. Patient also needs Folic acid and thiamine called in. She was prescribed both at discharge,but it was not sent to pharmacy. Please advise.  Message routed to Marlowe Sax, NP

## 2021-04-18 ENCOUNTER — Telehealth: Payer: Self-pay | Admitting: Internal Medicine

## 2021-04-18 DIAGNOSIS — F03911 Unspecified dementia, unspecified severity, with agitation: Secondary | ICD-10-CM | POA: Diagnosis not present

## 2021-04-18 DIAGNOSIS — I2510 Atherosclerotic heart disease of native coronary artery without angina pectoris: Secondary | ICD-10-CM | POA: Diagnosis not present

## 2021-04-18 DIAGNOSIS — J441 Chronic obstructive pulmonary disease with (acute) exacerbation: Secondary | ICD-10-CM | POA: Diagnosis not present

## 2021-04-18 DIAGNOSIS — E860 Dehydration: Secondary | ICD-10-CM | POA: Diagnosis not present

## 2021-04-18 DIAGNOSIS — M1990 Unspecified osteoarthritis, unspecified site: Secondary | ICD-10-CM | POA: Diagnosis not present

## 2021-04-18 DIAGNOSIS — I10 Essential (primary) hypertension: Secondary | ICD-10-CM | POA: Diagnosis not present

## 2021-04-18 DIAGNOSIS — G9341 Metabolic encephalopathy: Secondary | ICD-10-CM | POA: Diagnosis not present

## 2021-04-18 DIAGNOSIS — R1310 Dysphagia, unspecified: Secondary | ICD-10-CM | POA: Diagnosis not present

## 2021-04-18 DIAGNOSIS — I350 Nonrheumatic aortic (valve) stenosis: Secondary | ICD-10-CM | POA: Diagnosis not present

## 2021-04-18 LAB — CBC WITH DIFFERENTIAL/PLATELET
Absolute Monocytes: 206 cells/uL (ref 200–950)
Basophils Absolute: 15 cells/uL (ref 0–200)
Basophils Relative: 0.1 %
Eosinophils Absolute: 15 cells/uL (ref 15–500)
Eosinophils Relative: 0.1 %
HCT: 37.1 % (ref 35.0–45.0)
Hemoglobin: 12.1 g/dL (ref 11.7–15.5)
Lymphs Abs: 632 cells/uL — ABNORMAL LOW (ref 850–3900)
MCH: 30.6 pg (ref 27.0–33.0)
MCHC: 32.6 g/dL (ref 32.0–36.0)
MCV: 93.9 fL (ref 80.0–100.0)
MPV: 10.3 fL (ref 7.5–12.5)
Monocytes Relative: 1.4 %
Neutro Abs: 13833 cells/uL — ABNORMAL HIGH (ref 1500–7800)
Neutrophils Relative %: 94.1 %
Platelets: 431 10*3/uL — ABNORMAL HIGH (ref 140–400)
RBC: 3.95 10*6/uL (ref 3.80–5.10)
RDW: 13.1 % (ref 11.0–15.0)
Total Lymphocyte: 4.3 %
WBC: 14.7 10*3/uL — ABNORMAL HIGH (ref 3.8–10.8)

## 2021-04-18 LAB — BASIC METABOLIC PANEL WITH GFR
BUN: 17 mg/dL (ref 7–25)
CO2: 27 mmol/L (ref 20–32)
Calcium: 8.6 mg/dL (ref 8.6–10.4)
Chloride: 107 mmol/L (ref 98–110)
Creat: 0.97 mg/dL (ref 0.60–1.00)
Glucose, Bld: 93 mg/dL (ref 65–139)
Potassium: 4.5 mmol/L (ref 3.5–5.3)
Sodium: 142 mmol/L (ref 135–146)
eGFR: 60 mL/min/{1.73_m2} (ref >=60)

## 2021-04-18 NOTE — Telephone Encounter (Signed)
Spoke with patient's son, Marton Redwood, regarding the Palliative referral/services and he was in agreement with scheduling visit.  I have scheduled an In-home Palliative Consult with Dr. Mariea Clonts for 05/10/21 @ 1 PM, and documentation will be completed in the EMR in [place].

## 2021-04-19 DIAGNOSIS — R1310 Dysphagia, unspecified: Secondary | ICD-10-CM | POA: Diagnosis not present

## 2021-04-19 DIAGNOSIS — I2510 Atherosclerotic heart disease of native coronary artery without angina pectoris: Secondary | ICD-10-CM | POA: Diagnosis not present

## 2021-04-19 DIAGNOSIS — I10 Essential (primary) hypertension: Secondary | ICD-10-CM | POA: Diagnosis not present

## 2021-04-19 DIAGNOSIS — J441 Chronic obstructive pulmonary disease with (acute) exacerbation: Secondary | ICD-10-CM | POA: Diagnosis not present

## 2021-04-19 DIAGNOSIS — I350 Nonrheumatic aortic (valve) stenosis: Secondary | ICD-10-CM | POA: Diagnosis not present

## 2021-04-19 DIAGNOSIS — F03911 Unspecified dementia, unspecified severity, with agitation: Secondary | ICD-10-CM | POA: Diagnosis not present

## 2021-04-19 DIAGNOSIS — E860 Dehydration: Secondary | ICD-10-CM | POA: Diagnosis not present

## 2021-04-19 DIAGNOSIS — M1990 Unspecified osteoarthritis, unspecified site: Secondary | ICD-10-CM | POA: Diagnosis not present

## 2021-04-19 DIAGNOSIS — G9341 Metabolic encephalopathy: Secondary | ICD-10-CM | POA: Diagnosis not present

## 2021-04-19 NOTE — Telephone Encounter (Signed)
Please address the request for orders. Thank you.  Message routed back to Marlowe Sax, NP

## 2021-04-19 NOTE — Telephone Encounter (Signed)
Noted  

## 2021-04-19 NOTE — Telephone Encounter (Signed)
Refill medication as requested.  

## 2021-04-20 DIAGNOSIS — G9341 Metabolic encephalopathy: Secondary | ICD-10-CM | POA: Diagnosis not present

## 2021-04-20 DIAGNOSIS — F03911 Unspecified dementia, unspecified severity, with agitation: Secondary | ICD-10-CM | POA: Diagnosis not present

## 2021-04-20 DIAGNOSIS — I10 Essential (primary) hypertension: Secondary | ICD-10-CM | POA: Diagnosis not present

## 2021-04-20 DIAGNOSIS — E860 Dehydration: Secondary | ICD-10-CM | POA: Diagnosis not present

## 2021-04-20 DIAGNOSIS — I350 Nonrheumatic aortic (valve) stenosis: Secondary | ICD-10-CM | POA: Diagnosis not present

## 2021-04-20 DIAGNOSIS — M1990 Unspecified osteoarthritis, unspecified site: Secondary | ICD-10-CM | POA: Diagnosis not present

## 2021-04-20 DIAGNOSIS — R1310 Dysphagia, unspecified: Secondary | ICD-10-CM | POA: Diagnosis not present

## 2021-04-20 DIAGNOSIS — I2510 Atherosclerotic heart disease of native coronary artery without angina pectoris: Secondary | ICD-10-CM | POA: Diagnosis not present

## 2021-04-20 DIAGNOSIS — J441 Chronic obstructive pulmonary disease with (acute) exacerbation: Secondary | ICD-10-CM | POA: Diagnosis not present

## 2021-04-21 ENCOUNTER — Telehealth: Payer: Self-pay | Admitting: *Deleted

## 2021-04-21 MED ORDER — FOLIC ACID 1 MG PO TABS: 1.0000 mg | ORAL_TABLET | Freq: Every day | ORAL | 1 refills | Status: DC

## 2021-04-21 NOTE — Telephone Encounter (Signed)
Patient notified that Rx was sent to pharmacy.  

## 2021-04-21 NOTE — Telephone Encounter (Signed)
Patient called and stated that pharmacy does not have the Folic Acid 1mg  OTC. Pharmacist Stated that she would need a Rx sent to pharmacy since they do not have it OTC to [facility].   Pended Rx and sent to [facility] for approval.

## 2021-04-21 NOTE — Telephone Encounter (Signed)
Signed.

## 2021-04-25 DIAGNOSIS — F03911 Unspecified dementia, unspecified severity, with agitation: Secondary | ICD-10-CM | POA: Diagnosis not present

## 2021-04-25 DIAGNOSIS — E860 Dehydration: Secondary | ICD-10-CM | POA: Diagnosis not present

## 2021-04-25 DIAGNOSIS — R1310 Dysphagia, unspecified: Secondary | ICD-10-CM | POA: Diagnosis not present

## 2021-04-25 DIAGNOSIS — M1990 Unspecified osteoarthritis, unspecified site: Secondary | ICD-10-CM | POA: Diagnosis not present

## 2021-04-25 DIAGNOSIS — I350 Nonrheumatic aortic (valve) stenosis: Secondary | ICD-10-CM | POA: Diagnosis not present

## 2021-04-25 DIAGNOSIS — I10 Essential (primary) hypertension: Secondary | ICD-10-CM | POA: Diagnosis not present

## 2021-04-25 DIAGNOSIS — G9341 Metabolic encephalopathy: Secondary | ICD-10-CM | POA: Diagnosis not present

## 2021-04-25 DIAGNOSIS — I2510 Atherosclerotic heart disease of native coronary artery without angina pectoris: Secondary | ICD-10-CM | POA: Diagnosis not present

## 2021-04-25 DIAGNOSIS — J441 Chronic obstructive pulmonary disease with (acute) exacerbation: Secondary | ICD-10-CM | POA: Diagnosis not present

## 2021-04-26 DIAGNOSIS — I2510 Atherosclerotic heart disease of native coronary artery without angina pectoris: Secondary | ICD-10-CM | POA: Diagnosis not present

## 2021-04-26 DIAGNOSIS — G9341 Metabolic encephalopathy: Secondary | ICD-10-CM | POA: Diagnosis not present

## 2021-04-26 DIAGNOSIS — F03911 Unspecified dementia, unspecified severity, with agitation: Secondary | ICD-10-CM | POA: Diagnosis not present

## 2021-04-26 DIAGNOSIS — I350 Nonrheumatic aortic (valve) stenosis: Secondary | ICD-10-CM | POA: Diagnosis not present

## 2021-04-26 DIAGNOSIS — M1990 Unspecified osteoarthritis, unspecified site: Secondary | ICD-10-CM | POA: Diagnosis not present

## 2021-04-26 DIAGNOSIS — I10 Essential (primary) hypertension: Secondary | ICD-10-CM | POA: Diagnosis not present

## 2021-04-26 DIAGNOSIS — J441 Chronic obstructive pulmonary disease with (acute) exacerbation: Secondary | ICD-10-CM | POA: Diagnosis not present

## 2021-04-26 DIAGNOSIS — E860 Dehydration: Secondary | ICD-10-CM | POA: Diagnosis not present

## 2021-04-26 DIAGNOSIS — R1310 Dysphagia, unspecified: Secondary | ICD-10-CM | POA: Diagnosis not present

## 2021-04-27 ENCOUNTER — Telehealth: Payer: Self-pay | Admitting: Pulmonary Disease

## 2021-04-27 DIAGNOSIS — I350 Nonrheumatic aortic (valve) stenosis: Secondary | ICD-10-CM | POA: Diagnosis not present

## 2021-04-27 DIAGNOSIS — F03911 Unspecified dementia, unspecified severity, with agitation: Secondary | ICD-10-CM | POA: Diagnosis not present

## 2021-04-27 DIAGNOSIS — E860 Dehydration: Secondary | ICD-10-CM | POA: Diagnosis not present

## 2021-04-27 DIAGNOSIS — R1310 Dysphagia, unspecified: Secondary | ICD-10-CM | POA: Diagnosis not present

## 2021-04-27 DIAGNOSIS — G9341 Metabolic encephalopathy: Secondary | ICD-10-CM | POA: Diagnosis not present

## 2021-04-27 DIAGNOSIS — M1990 Unspecified osteoarthritis, unspecified site: Secondary | ICD-10-CM | POA: Diagnosis not present

## 2021-04-27 DIAGNOSIS — I10 Essential (primary) hypertension: Secondary | ICD-10-CM | POA: Diagnosis not present

## 2021-04-27 DIAGNOSIS — I2510 Atherosclerotic heart disease of native coronary artery without angina pectoris: Secondary | ICD-10-CM | POA: Diagnosis not present

## 2021-04-27 DIAGNOSIS — J441 Chronic obstructive pulmonary disease with (acute) exacerbation: Secondary | ICD-10-CM | POA: Diagnosis not present

## 2021-04-27 NOTE — Telephone Encounter (Signed)
Left msg for Beth- ? What order? Pt last seen Nov 2021 and we advised her to be on 2lpm o2 with sleep  Advised to call us back

## 2021-04-27 NOTE — Telephone Encounter (Signed)
Lm for [employer], Therapist, sports.

## 2021-04-27 NOTE — Telephone Encounter (Signed)
Beth RN is returning phone call. Beth phone number is [phone]. May leave detailed message on voicemail.

## 2021-04-30 ENCOUNTER — Other Ambulatory Visit: Payer: Self-pay | Admitting: Family

## 2021-04-30 DIAGNOSIS — F0390 Unspecified dementia without behavioral disturbance: Secondary | ICD-10-CM

## 2021-04-30 DIAGNOSIS — E440 Moderate protein-calorie malnutrition: Secondary | ICD-10-CM

## 2021-05-01 NOTE — Telephone Encounter (Signed)
High risk or very high risk warning populated when attempting to refill medication. RX request sent to PCP for review and approval if warranted.   

## 2021-05-02 DIAGNOSIS — R1310 Dysphagia, unspecified: Secondary | ICD-10-CM | POA: Diagnosis not present

## 2021-05-02 DIAGNOSIS — F03911 Unspecified dementia, unspecified severity, with agitation: Secondary | ICD-10-CM | POA: Diagnosis not present

## 2021-05-02 DIAGNOSIS — J441 Chronic obstructive pulmonary disease with (acute) exacerbation: Secondary | ICD-10-CM | POA: Diagnosis not present

## 2021-05-02 DIAGNOSIS — I10 Essential (primary) hypertension: Secondary | ICD-10-CM | POA: Diagnosis not present

## 2021-05-02 DIAGNOSIS — E860 Dehydration: Secondary | ICD-10-CM | POA: Diagnosis not present

## 2021-05-02 DIAGNOSIS — I350 Nonrheumatic aortic (valve) stenosis: Secondary | ICD-10-CM | POA: Diagnosis not present

## 2021-05-02 DIAGNOSIS — M1990 Unspecified osteoarthritis, unspecified site: Secondary | ICD-10-CM | POA: Diagnosis not present

## 2021-05-02 DIAGNOSIS — G9341 Metabolic encephalopathy: Secondary | ICD-10-CM | POA: Diagnosis not present

## 2021-05-02 DIAGNOSIS — I2510 Atherosclerotic heart disease of native coronary artery without angina pectoris: Secondary | ICD-10-CM | POA: Diagnosis not present

## 2021-05-04 DIAGNOSIS — R1310 Dysphagia, unspecified: Secondary | ICD-10-CM | POA: Diagnosis not present

## 2021-05-04 DIAGNOSIS — I10 Essential (primary) hypertension: Secondary | ICD-10-CM | POA: Diagnosis not present

## 2021-05-04 DIAGNOSIS — I2510 Atherosclerotic heart disease of native coronary artery without angina pectoris: Secondary | ICD-10-CM | POA: Diagnosis not present

## 2021-05-04 DIAGNOSIS — F03911 Unspecified dementia, unspecified severity, with agitation: Secondary | ICD-10-CM | POA: Diagnosis not present

## 2021-05-04 DIAGNOSIS — I350 Nonrheumatic aortic (valve) stenosis: Secondary | ICD-10-CM | POA: Diagnosis not present

## 2021-05-04 DIAGNOSIS — E860 Dehydration: Secondary | ICD-10-CM | POA: Diagnosis not present

## 2021-05-04 DIAGNOSIS — M1990 Unspecified osteoarthritis, unspecified site: Secondary | ICD-10-CM | POA: Diagnosis not present

## 2021-05-04 DIAGNOSIS — G9341 Metabolic encephalopathy: Secondary | ICD-10-CM | POA: Diagnosis not present

## 2021-05-04 DIAGNOSIS — J441 Chronic obstructive pulmonary disease with (acute) exacerbation: Secondary | ICD-10-CM | POA: Diagnosis not present

## 2021-05-10 ENCOUNTER — Telehealth: Payer: Self-pay

## 2021-05-10 DIAGNOSIS — F329 Major depressive disorder, single episode, unspecified: Secondary | ICD-10-CM | POA: Diagnosis not present

## 2021-05-10 DIAGNOSIS — F419 Anxiety disorder, unspecified: Secondary | ICD-10-CM | POA: Diagnosis not present

## 2021-05-10 DIAGNOSIS — M5030 Other cervical disc degeneration, unspecified cervical region: Secondary | ICD-10-CM | POA: Diagnosis not present

## 2021-05-10 DIAGNOSIS — E860 Dehydration: Secondary | ICD-10-CM | POA: Diagnosis not present

## 2021-05-10 DIAGNOSIS — I2510 Atherosclerotic heart disease of native coronary artery without angina pectoris: Secondary | ICD-10-CM | POA: Diagnosis not present

## 2021-05-10 DIAGNOSIS — I10 Essential (primary) hypertension: Secondary | ICD-10-CM | POA: Diagnosis not present

## 2021-05-10 DIAGNOSIS — J449 Chronic obstructive pulmonary disease, unspecified: Secondary | ICD-10-CM | POA: Diagnosis not present

## 2021-05-10 DIAGNOSIS — J441 Chronic obstructive pulmonary disease with (acute) exacerbation: Secondary | ICD-10-CM | POA: Diagnosis not present

## 2021-05-10 DIAGNOSIS — I350 Nonrheumatic aortic (valve) stenosis: Secondary | ICD-10-CM | POA: Diagnosis not present

## 2021-05-10 DIAGNOSIS — J9611 Chronic respiratory failure with hypoxia: Secondary | ICD-10-CM | POA: Diagnosis not present

## 2021-05-10 DIAGNOSIS — R1310 Dysphagia, unspecified: Secondary | ICD-10-CM | POA: Diagnosis not present

## 2021-05-10 DIAGNOSIS — F0390 Unspecified dementia without behavioral disturbance: Secondary | ICD-10-CM | POA: Diagnosis not present

## 2021-05-10 DIAGNOSIS — F03911 Unspecified dementia, unspecified severity, with agitation: Secondary | ICD-10-CM | POA: Diagnosis not present

## 2021-05-10 DIAGNOSIS — M1990 Unspecified osteoarthritis, unspecified site: Secondary | ICD-10-CM | POA: Diagnosis not present

## 2021-05-10 DIAGNOSIS — Z515 Encounter for palliative care: Secondary | ICD-10-CM | POA: Diagnosis not present

## 2021-05-10 DIAGNOSIS — G9341 Metabolic encephalopathy: Secondary | ICD-10-CM | POA: Diagnosis not present

## 2021-05-10 NOTE — Telephone Encounter (Signed)
Left message on voicemail for patients son to return call when available.  Reason for call, schedule appt with Dinah to further discuss concerns in this encounter

## 2021-05-10 NOTE — Telephone Encounter (Signed)
Incoming call received from [facility] with [facility] stating patient finished prednisone about 1.5 weeks ago and is now with a decreased appetite. Patient is taking the mirtazapine, yet she may need a stronger dose or something different to stimulate her appetite.  Patient also c/o new onset right leg cramping.   According to documentation in Epic, patient had an appointment today at 1 pm with Dr.Reed through [facility] Palliative.    Please advise

## 2021-05-10 NOTE — Telephone Encounter (Signed)
Please verify with patient if decreased appetite and right leg pain was addressed by Dr.Reed during visit.If not will need to increase Remeron from 7.5 mg tablet to 15 mg tablet one by mouth daily at bedtime for appetite. Will need an office visit to evaluate right leg cramp possible check labs.

## 2021-05-11 DIAGNOSIS — I2510 Atherosclerotic heart disease of native coronary artery without angina pectoris: Secondary | ICD-10-CM | POA: Diagnosis not present

## 2021-05-11 DIAGNOSIS — G9341 Metabolic encephalopathy: Secondary | ICD-10-CM | POA: Diagnosis not present

## 2021-05-11 DIAGNOSIS — R1310 Dysphagia, unspecified: Secondary | ICD-10-CM | POA: Diagnosis not present

## 2021-05-11 DIAGNOSIS — M1990 Unspecified osteoarthritis, unspecified site: Secondary | ICD-10-CM | POA: Diagnosis not present

## 2021-05-11 DIAGNOSIS — I350 Nonrheumatic aortic (valve) stenosis: Secondary | ICD-10-CM | POA: Diagnosis not present

## 2021-05-11 DIAGNOSIS — I10 Essential (primary) hypertension: Secondary | ICD-10-CM | POA: Diagnosis not present

## 2021-05-11 DIAGNOSIS — E860 Dehydration: Secondary | ICD-10-CM | POA: Diagnosis not present

## 2021-05-11 DIAGNOSIS — F03911 Unspecified dementia, unspecified severity, with agitation: Secondary | ICD-10-CM | POA: Diagnosis not present

## 2021-05-11 DIAGNOSIS — J441 Chronic obstructive pulmonary disease with (acute) exacerbation: Secondary | ICD-10-CM | POA: Diagnosis not present

## 2021-05-14 DIAGNOSIS — J449 Chronic obstructive pulmonary disease, unspecified: Secondary | ICD-10-CM | POA: Diagnosis not present

## 2021-05-14 DIAGNOSIS — J441 Chronic obstructive pulmonary disease with (acute) exacerbation: Secondary | ICD-10-CM | POA: Diagnosis not present

## 2021-05-14 DIAGNOSIS — J438 Other emphysema: Secondary | ICD-10-CM | POA: Diagnosis not present

## 2021-05-16 NOTE — Telephone Encounter (Signed)
Called and spoke to [place] with [facility] and informed her the last OV she was advised to wear her 2lpm nocturnal O2, this was back in 2021. However, pt has been hospitalized since then, Beth was aware of this. Per Eustaquio Maize pt was advised to wear daytime O2 as needed. Nothing further needed at this time.

## 2021-05-16 NOTE — Telephone Encounter (Signed)
Spoke with patients son, scheduled an appointment to discuss concerns on Monday 05/22/20 (earliest he would be available to bring patient in)

## 2021-05-17 DIAGNOSIS — J441 Chronic obstructive pulmonary disease with (acute) exacerbation: Secondary | ICD-10-CM | POA: Diagnosis not present

## 2021-05-17 DIAGNOSIS — E860 Dehydration: Secondary | ICD-10-CM | POA: Diagnosis not present

## 2021-05-17 DIAGNOSIS — I10 Essential (primary) hypertension: Secondary | ICD-10-CM | POA: Diagnosis not present

## 2021-05-17 DIAGNOSIS — I350 Nonrheumatic aortic (valve) stenosis: Secondary | ICD-10-CM | POA: Diagnosis not present

## 2021-05-17 DIAGNOSIS — R1310 Dysphagia, unspecified: Secondary | ICD-10-CM | POA: Diagnosis not present

## 2021-05-17 DIAGNOSIS — G9341 Metabolic encephalopathy: Secondary | ICD-10-CM | POA: Diagnosis not present

## 2021-05-17 DIAGNOSIS — I2510 Atherosclerotic heart disease of native coronary artery without angina pectoris: Secondary | ICD-10-CM | POA: Diagnosis not present

## 2021-05-17 DIAGNOSIS — F03911 Unspecified dementia, unspecified severity, with agitation: Secondary | ICD-10-CM | POA: Diagnosis not present

## 2021-05-17 DIAGNOSIS — M1990 Unspecified osteoarthritis, unspecified site: Secondary | ICD-10-CM | POA: Diagnosis not present

## 2021-05-22 ENCOUNTER — Ambulatory Visit: Payer: Medicare HMO | Admitting: Family

## 2021-05-23 ENCOUNTER — Ambulatory Visit: Payer: Medicare HMO | Admitting: Gastroenterology

## 2021-05-24 ENCOUNTER — Ambulatory Visit (INDEPENDENT_AMBULATORY_CARE_PROVIDER_SITE_OTHER): Payer: Medicare HMO | Admitting: Adult Health

## 2021-05-24 ENCOUNTER — Other Ambulatory Visit: Payer: Self-pay

## 2021-05-24 ENCOUNTER — Encounter: Payer: Self-pay | Admitting: Adult Health

## 2021-05-24 VITALS — BP 120/80 | HR 95 | Temp 96.8°F | Ht 64.0 in | Wt 99.0 lb

## 2021-05-24 DIAGNOSIS — R634 Abnormal weight loss: Secondary | ICD-10-CM

## 2021-05-24 MED ORDER — MIRTAZAPINE 15 MG PO TABS: 15.0000 mg | ORAL_TABLET | Freq: Every day | ORAL | 0 refills | Status: DC

## 2021-05-24 NOTE — Progress Notes (Signed)
Location:  [place] of Service:   clinic    CODE STATUS: full code   Allergies  Allergen Reactions   Lipitor [Atorvastatin] Other (See Comments)    Myalgias    Chief Complaint  Patient presents with   Acute Visit    Right leg pain and decreased appetite. Patient states that she does not have pain in leg anymore. Since coming off of steroids appetite has really decreased. Son would like to know if there may be something she could take for appetite.    HPI:  With home health therapy she was having right leg pain. She denies any pain today. She denies any swelling present. There is no tenderness to palpation of right calf/thigh negative homan sign.  She is not eating. She has completed her steroid taper; during that time she ate well. Asa soon as they steroids ended she stopped eating. She is taking supplements at times. Her family would like for her to take something routinely to help with appetite. We discussed the risks involved with taking long term steroids; this is not an option at this time. She is taking remeron 75 mg nightly at this time with little effect on appetite.   Past Medical History:  Diagnosis Date   Allergic rhinitis    Allergic rhinitis, cause unspecified 09/21/2011   Aortic stenosis 08/23/2016   Arthritis    Asthma    Asthma 09/21/2011   Colon polyps    COPD (chronic obstructive pulmonary disease) (HCC)    mild    Coronary artery calcification 08/23/2016   Dementia (HCC)    Depression with anxiety    HTN (hypertension)    Hyperlipidemia    UTI (lower urinary tract infection)     Past Surgical History:  Procedure Laterality Date   DENTAL SURGERY     pilonidial cyst     TONSILLECTOMY      Social History   Socioeconomic History   Marital status: Divorced    Spouse name: Not on file   Number of children: 3   Years of education: two years of college   Highest education level: Not on file  Occupational History   Occupation:  Retired  Tobacco Use   Smoking status: Former    Packs/day: 0.75    Years: 55.00    Pack years: 41.25    Types: Cigarettes    Quit date: 04/18/2015    Years since quitting: 6.1   Smokeless tobacco: Never  Vaping Use   Vaping Use: Never used  Substance and Sexual Activity   Alcohol use: Not Currently    Comment: wine couple times each week - daily at times   Drug use: Never   Sexual activity: Not on file  Other Topics Concern   Not on file  Social History Narrative   Lives with a friend.   Right-handed.   Caffeine use: one soda per day.   Social Determinants of Health   Financial Resource Strain: Not on file  Food Insecurity: Not on file  Transportation Needs: Not on file  Physical Activity: Not on file  Stress: Not on file  Social Connections: Not on file  Intimate Partner Violence: Not on file   Family History  Problem Relation Age of Onset   Alcohol abuse Other    Arthritis Other    Heart disease Other    Stroke Other    Hypertension Other    Hypertension Father    Stroke Father  Atrial fibrillation Brother    Other Mother        unsure of medical history   Colon cancer Neg Hx       VITAL SIGNS BP 120/80   Pulse 95   Temp (!) 96.8 F (36 C)   Ht 5\' 4"  (1.626 m)   Wt 99 lb (44.9 kg)   SpO2 95%   BMI 16.99 kg/m   Outpatient Encounter Medications as of 05/24/2021  Medication Sig   acetaminophen (TYLENOL) 325 MG tablet Take 650 mg by mouth every 6 (six) hours as needed for mild pain or headache.   amLODipine (NORVASC) 5 MG tablet Take 1 tablet (5 mg total) by mouth daily.   buPROPion (WELLBUTRIN XL) 150 MG 24 hr tablet Take 1 tablet (150 mg total) by mouth daily.   Cholecalciferol (THERA-D 2000) 50 MCG (2000 UT) TABS 1 tab by mouth once daily   clonazePAM (KLONOPIN) 0.5 MG tablet Take 1 tablet (0.5 mg total) by mouth 2 (two) times daily as needed for anxiety.   donepezil (ARICEPT) 5 MG tablet Take 1 tablet (5 mg total) by mouth at bedtime.    FLUoxetine (PROZAC) 40 MG capsule TAKE (1) CAPSULE DAILY.   fluticasone (FLONASE) 50 MCG/ACT nasal spray Place 2 sprays into both nostrils daily.   folic acid (FOLVITE) 1 MG tablet Take 1 tablet (1 mg total) by mouth daily.   ipratropium (ATROVENT) 0.02 % nebulizer solution Take 2.5 mLs (0.5 mg total) by nebulization 3 (three) times daily.   levalbuterol (XOPENEX) 0.63 MG/3ML nebulizer solution Take 3 mLs (0.63 mg total) by nebulization in the morning, at noon, and at bedtime. USE 1 VIAL VIA NEBULIZER EVERY 6 HOURS AS NEEDED FOR WHEEZING OR SHORTNESS OF BREATH.   memantine (NAMENDA) 10 MG tablet Take 1 tablet (10 mg total) by mouth 2 (two) times daily.   mirtazapine (REMERON) 7.5 MG tablet Take 1 tablet (7.5 mg total) by mouth at bedtime.   montelukast (SINGULAIR) 10 MG tablet Take 1 tablet (10 mg total) by mouth daily.   Multiple Vitamin (MULTIVITAMIN WITH MINERALS) TABS tablet Take 1 tablet by mouth daily.   omeprazole (PRILOSEC OTC) 20 MG tablet Take 2 tablets (40 mg total) by mouth daily.   QUEtiapine (SEROQUEL) 25 MG tablet Take 1 tablet (25 mg total) by mouth at bedtime.   Respiratory Therapy Supplies (FLUTTER) DEVI Use as directed   rosuvastatin (CRESTOR) 20 MG tablet Take 1 tablet (20 mg total) by mouth daily.   thiamine 100 MG tablet Take 1 tablet (100 mg total) by mouth daily.   TRELEGY ELLIPTA 200-62.5-25 MCG/INH AEPB INHALE 1 PUFF INTO THE LUNGS ONCE DAILY AS DIRECTED.   vitamin B-12 (CYANOCOBALAMIN) 1000 MCG tablet Take 1 tablet (1,000 mcg total) by mouth daily.   No facility-administered encounter medications on file as of 05/24/2021.     SIGNIFICANT DIAGNOSTIC EXAMS  Review of Systems  Constitutional:  Negative for malaise/fatigue.  Respiratory:  Negative for cough and shortness of breath.   Cardiovascular:  Negative for chest pain, palpitations and leg swelling.  Gastrointestinal:  Negative for abdominal pain, constipation and heartburn.  Musculoskeletal:  Negative for  back pain, joint pain and myalgias.  Skin: Negative.   Neurological:  Negative for dizziness.  Psychiatric/Behavioral:  The patient is not nervous/anxious.     Physical Exam Constitutional:      General: She is not in acute distress.    Appearance: She is underweight. She is not diaphoretic.  Neck:  Thyroid: No thyromegaly.  Cardiovascular:     Rate and Rhythm: Normal rate and regular rhythm.     Heart sounds: Normal heart sounds.  Pulmonary:     Effort: Pulmonary effort is normal. No respiratory distress.     Breath sounds: Normal breath sounds.  Abdominal:     General: Bowel sounds are normal. There is no distension.     Palpations: Abdomen is soft.     Tenderness: There is no abdominal tenderness.  Musculoskeletal:        General: Normal range of motion.     Cervical back: Neck supple.     Right lower leg: No edema.     Left lower leg: No edema.  Lymphadenopathy:     Cervical: No cervical adenopathy.  Skin:    General: Skin is warm and dry.  Neurological:     Mental Status: She is alert. Mental status is at baseline.  Psychiatric:        Mood and Affect: Mood normal.      ASSESSMENT/ PLAN:  TODAY  Weight loss: is worse;   Will increase her remeron to 15 mg nightly  We have discussed treatment options: marinol is an option; we discussed this medication. At this time we will utilize her current medication and make changes in the future as indicated.    Ok Edwards NP [facility]  Contact [phone] Monday through Friday 8am- 5pm  After hours call [phone]

## 2021-05-29 ENCOUNTER — Encounter: Payer: Self-pay | Admitting: Internal Medicine

## 2021-06-07 ENCOUNTER — Other Ambulatory Visit: Payer: Self-pay

## 2021-06-07 ENCOUNTER — Encounter: Payer: Self-pay | Admitting: Orthopedic Surgery

## 2021-06-07 ENCOUNTER — Telehealth (INDEPENDENT_AMBULATORY_CARE_PROVIDER_SITE_OTHER): Payer: Medicare HMO | Admitting: Orthopedic Surgery

## 2021-06-07 DIAGNOSIS — U071 COVID-19: Secondary | ICD-10-CM | POA: Diagnosis not present

## 2021-06-07 MED ORDER — NIRMATRELVIR/RITONAVIR (PAXLOVID) TABLET (RENAL DOSING): 2.0000 | ORAL_TABLET | Freq: Two times a day (BID) | ORAL | 0 refills | Status: AC

## 2021-06-07 NOTE — Progress Notes (Signed)
Careteam: Patient Care Team: Ngetich, Nelda Bucks, NP as PCP - General (Family Medicine) Skeet Latch, MD as PCP - Cardiology (Cardiology)  Seen by: Windell Moulding, AGNP-C  PLACE OF SERVICE:  [place] Directive information Does Patient Have a Medical Advance Directive?: Yes, Type of Advance Directive: Living will, Does patient want to make changes to medical advance directive?: No - Patient declined  Allergies  Allergen Reactions   Lipitor [Atorvastatin] Other (See Comments)    Myalgias    Chief Complaint  Patient presents with   Acute Visit    Patient complains of testing Positive for Covid-19. Patient complains of coughing is only symptom.     HPI: Patient is a 75 y.o. female seen today via telephone due positive covid test.   Tested positive for Covid earlier today. Her son and other family members have also started to show symptoms. Cough is dry and constant. She is sometimes spitting up phlegm. No fevers at this time, but she does feel tired. Denies shortness of breath. Requesting Paxlovid due to history of asthma and emphysema.   Review of Systems:  Review of Systems  Constitutional:  Positive for malaise/fatigue. Negative for chills and fever.  HENT:  Negative for sinus pain and sore throat.   Respiratory:  Positive for cough and sputum production. Negative for shortness of breath and wheezing.   Cardiovascular:  Negative for chest pain and leg swelling.  Gastrointestinal:  Negative for diarrhea, nausea and vomiting.  Musculoskeletal:  Negative for myalgias.  Skin: Negative.   Neurological:  Negative for dizziness.  Psychiatric/Behavioral:  Negative for depression. The patient is not nervous/anxious.    Past Medical History:  Diagnosis Date   Allergic rhinitis    Allergic rhinitis, cause unspecified 09/21/2011   Aortic stenosis 08/23/2016   Arthritis    Asthma    Asthma 09/21/2011   Colon polyps    COPD (chronic obstructive pulmonary disease) (HCC)     mild    Coronary artery calcification 08/23/2016   Dementia (HCC)    Depression with anxiety    HTN (hypertension)    Hyperlipidemia    UTI (lower urinary tract infection)    Past Surgical History:  Procedure Laterality Date   DENTAL SURGERY     pilonidial cyst     TONSILLECTOMY     Social History:   reports that she quit smoking about 6 years ago. Her smoking use included cigarettes. She has a 41.25 pack-year smoking history. She has never used smokeless tobacco. She reports that she does not currently use alcohol. She reports that she does not use drugs.  Family History  Problem Relation Age of Onset   Alcohol abuse Other    Arthritis Other    Heart disease Other    Stroke Other    Hypertension Other    Hypertension Father    Stroke Father    Atrial fibrillation Brother    Other Mother        unsure of medical history   Colon cancer Neg Hx     Medications: Patient's Medications  New Prescriptions   No medications on file  Previous Medications   ACETAMINOPHEN (TYLENOL) 325 MG TABLET    Take 650 mg by mouth every 6 (six) hours as needed for mild pain or headache.   AMLODIPINE (NORVASC) 5 MG TABLET    Take 1 tablet (5 mg total) by mouth daily.   BUPROPION (WELLBUTRIN XL) 150 MG 24 HR TABLET    Take  1 tablet (150 mg total) by mouth daily.   CHOLECALCIFEROL (THERA-D 2000) 50 MCG (2000 UT) TABS    1 tab by mouth once daily   CLONAZEPAM (KLONOPIN) 0.5 MG TABLET    Take 1 tablet (0.5 mg total) by mouth 2 (two) times daily as needed for anxiety.   DONEPEZIL (ARICEPT) 5 MG TABLET    Take 1 tablet (5 mg total) by mouth at bedtime.   FLUOXETINE (PROZAC) 40 MG CAPSULE    TAKE (1) CAPSULE DAILY.   FLUTICASONE (FLONASE) 50 MCG/ACT NASAL SPRAY    Place 2 sprays into both nostrils daily.   FOLIC ACID (FOLVITE) 1 MG TABLET    Take 1 tablet (1 mg total) by mouth daily.   IPRATROPIUM (ATROVENT) 0.02 % NEBULIZER SOLUTION    Take 2.5 mLs (0.5 mg total) by nebulization 3 (three) times  daily.   LEVALBUTEROL (XOPENEX) 0.63 MG/3ML NEBULIZER SOLUTION    Take 3 mLs (0.63 mg total) by nebulization in the morning, at noon, and at bedtime. USE 1 VIAL VIA NEBULIZER EVERY 6 HOURS AS NEEDED FOR WHEEZING OR SHORTNESS OF BREATH.   MEMANTINE (NAMENDA) 10 MG TABLET    Take 1 tablet (10 mg total) by mouth 2 (two) times daily.   MIRTAZAPINE (REMERON) 15 MG TABLET    Take 1 tablet (15 mg total) by mouth at bedtime.   MONTELUKAST (SINGULAIR) 10 MG TABLET    Take 1 tablet (10 mg total) by mouth daily.   MULTIPLE VITAMIN (MULTIVITAMIN WITH MINERALS) TABS TABLET    Take 1 tablet by mouth daily.   OMEPRAZOLE (PRILOSEC OTC) 20 MG TABLET    Take 2 tablets (40 mg total) by mouth daily.   QUETIAPINE (SEROQUEL) 25 MG TABLET    Take 1 tablet (25 mg total) by mouth at bedtime.   RESPIRATORY THERAPY SUPPLIES (FLUTTER) DEVI    Use as directed   ROSUVASTATIN (CRESTOR) 20 MG TABLET    Take 1 tablet (20 mg total) by mouth daily.   THIAMINE 100 MG TABLET    Take 1 tablet (100 mg total) by mouth daily.   TRELEGY ELLIPTA 200-62.5-25 MCG/INH AEPB    INHALE 1 PUFF INTO THE LUNGS ONCE DAILY AS DIRECTED.   VITAMIN B-12 (CYANOCOBALAMIN) 1000 MCG TABLET    Take 1 tablet (1,000 mcg total) by mouth daily.  Modified Medications   No medications on file  Discontinued Medications   No medications on file    Physical Exam:  There were no vitals filed for this visit. There is no height or weight on file to calculate BMI. Wt Readings from Last 3 Encounters:  05/24/21 99 lb (44.9 kg)  04/17/21 95 lb (43.1 kg)  04/07/21 90 lb (40.8 kg)    Physical Exam unable to perform due to telephone encounter  Labs reviewed: Basic Metabolic Panel: Recent Labs    10/12/20 1617 04/07/21 1323 04/08/21 0616 04/09/21 0601 04/10/21 0502 04/11/21 0525 04/17/21 1623  NA 136 147*   < > 146* 147* 143 142  K 4.6 3.9   < > 4.4 4.7 4.2 4.5  CL 102 115*   < > 115* 113* 106 107  CO2 28 26   < > 23 29 32 27  GLUCOSE 88 67*   < >  169* 74 93 93  BUN 19 14   < > 15 15 19 17   CREATININE 1.03 0.83   < > 1.10* 0.89 0.86 0.97  CALCIUM 8.9 8.8*   < > 9.0 8.5*  8.8* 8.6  MG  --  1.9  --  2.1  --   --   --   PHOS  --  2.7  --   --   --   --   --   TSH 1.03  --   --   --   --   --   --    < > = values in this interval not displayed.   Liver Function Tests: Recent Labs    10/12/20 1617 04/07/21 1323  AST 29 29  ALT 24 43  ALKPHOS 102 68  BILITOT 0.2 0.5  PROT 6.9 6.8  ALBUMIN 3.7 3.1*   No results for input(s): LIPASE, AMYLASE in the last 8760 hours. No results for input(s): AMMONIA in the last 8760 hours. CBC: Recent Labs    04/07/21 1323 04/08/21 0616 04/10/21 0502 04/11/21 0525 04/17/21 1623  WBC 6.4   < > 9.0 8.1 14.7*  NEUTROABS 4.8  --  6.2  --  13,833*  HGB 15.6*   < > 10.2* 10.5* 12.1  HCT 50.1*   < > 32.7* 33.2* 37.1  MCV 94.0   < > 95.9 95.4 93.9  PLT 199   < > 242 270 431*   < > = values in this interval not displayed.   Lipid Panel: Recent Labs    10/12/20 1617  CHOL 137  HDL 52.30  LDLCALC 59  TRIG 128.0  CHOLHDL 3   TSH: Recent Labs    10/12/20 1617  TSH 1.03   A1C: Lab Results  Component Value Date   HGBA1C 4.9 04/05/2020     Assessment/Plan 1. COVID-19 - positive test 11/30 - symptoms include productive cough and fatigue - recommend starting vitamin C 1000 mg twice daily x 7 days - recommend starting vitamin D 2000 mg daily x 7 days - recommend starting zinc 50 mg daily x 7 days - contact PCP if symptoms worsen - isolation precautions discussed - nirmatrelvir/ritonavir EUA, renal dosing, (PAXLOVID) 10 x 150 MG & 10 x 100MG  TABS; Take 2 tablets by mouth 2 (two) times daily for 5 days. (Take nirmatrelvir 150 mg one tablet twice daily for 5 days and ritonavir 100 mg one tablet twice daily for 5 days) Patient GFR is 60  Dispense: 20 tablet; Refill: 0  Total time: 11 minutes. Greater than 50% of total time spent doing patient education on covid symptom, medication and  isolation management.   Telephone Note   I connected with Elenora Gamma by telephone and verified that I am speaking with the correct person using two identifiers.   Patient: Andrea Larson Patient location: home Provider: Windell Moulding NP Provider location: [facility]    I discussed the limitations, risks, security and privacy concerns of performing an evaluation and management service by telephone and the availability of in person appointments. I also discussed with the patient that there may be a patient responsible charge related to this service. The patient expressed understanding and agreed to proceed.    I discussed the assessment and treatment plan with the patient. The patient was provided an opportunity to ask questions and all were answered. The patient agreed with the plan and demonstrated an understanding of the instructions.     The patient was advised to call back or seek an in-person evaluation if the symptoms worsen or if the condition fails to improve as anticipated.   I provided 11 minutes of non-face-to-face time during this encounter.   Amberlin Utke [place],  AGNP  Avs printed and mailed    Next appt: none Chaniya Genter [place], [place] [facility] [phone]

## 2021-06-07 NOTE — Progress Notes (Signed)
  This service is provided via telemedicine  No vital signs collected/recorded due to the encounter was a telemedicine visit.   Location of patient (ex: home, work):  Home.  Patient consents to a telephone visit:  Yes  Location of the provider (ex: office, home):  [employer].  Name of any referring provider:  Ngetich, Nelda Bucks, NP   Names of all persons participating in the telemedicine service and their role in the encounter:  Patient, Tiona Caregiver, Heriberto [place], [place], Windell Moulding, NP.    Time spent on call: 8 minutes spent on the phone with Medical Assistant.

## 2021-06-07 NOTE — Patient Instructions (Signed)
-   recommend starting vitamin C 1000 mg twice daily x 7 days - recommend starting vitamin D 2000 mg daily x 7 days - recommend starting zinc 50 mg daily x 7 days - contact PCP if symptoms worsen  Prescription for Paxlovid sent to pharmacy

## 2021-06-23 ENCOUNTER — Other Ambulatory Visit: Payer: Self-pay | Admitting: Family

## 2021-06-23 ENCOUNTER — Ambulatory Visit: Payer: Medicare HMO | Admitting: Pulmonary Disease

## 2021-07-18 DIAGNOSIS — I350 Nonrheumatic aortic (valve) stenosis: Secondary | ICD-10-CM | POA: Diagnosis not present

## 2021-07-18 DIAGNOSIS — F330 Major depressive disorder, recurrent, mild: Secondary | ICD-10-CM | POA: Diagnosis not present

## 2021-07-18 DIAGNOSIS — F419 Anxiety disorder, unspecified: Secondary | ICD-10-CM | POA: Diagnosis not present

## 2021-07-18 DIAGNOSIS — M5010 Cervical disc disorder with radiculopathy, unspecified cervical region: Secondary | ICD-10-CM | POA: Diagnosis not present

## 2021-07-18 DIAGNOSIS — J9611 Chronic respiratory failure with hypoxia: Secondary | ICD-10-CM | POA: Diagnosis not present

## 2021-07-18 DIAGNOSIS — Z515 Encounter for palliative care: Secondary | ICD-10-CM | POA: Diagnosis not present

## 2021-07-18 DIAGNOSIS — J449 Chronic obstructive pulmonary disease, unspecified: Secondary | ICD-10-CM | POA: Diagnosis not present

## 2021-07-18 DIAGNOSIS — E43 Unspecified severe protein-calorie malnutrition: Secondary | ICD-10-CM | POA: Diagnosis not present

## 2021-07-18 DIAGNOSIS — F0390 Unspecified dementia without behavioral disturbance: Secondary | ICD-10-CM | POA: Diagnosis not present

## 2021-08-11 ENCOUNTER — Other Ambulatory Visit: Payer: Self-pay | Admitting: Family

## 2021-08-14 ENCOUNTER — Other Ambulatory Visit: Payer: Self-pay | Admitting: Internal Medicine

## 2021-08-15 ENCOUNTER — Encounter: Payer: Self-pay | Admitting: Family

## 2021-08-16 ENCOUNTER — Other Ambulatory Visit: Payer: Self-pay

## 2021-08-16 ENCOUNTER — Encounter: Payer: Self-pay | Admitting: Family

## 2021-08-16 ENCOUNTER — Ambulatory Visit (INDEPENDENT_AMBULATORY_CARE_PROVIDER_SITE_OTHER): Payer: Medicare HMO | Admitting: Family

## 2021-08-16 VITALS — BP 108/70 | HR 105 | Temp 97.5°F | Resp 16 | Ht 64.0 in | Wt 103.8 lb

## 2021-08-16 DIAGNOSIS — F03918 Unspecified dementia, unspecified severity, with other behavioral disturbance: Secondary | ICD-10-CM

## 2021-08-16 DIAGNOSIS — F418 Other specified anxiety disorders: Secondary | ICD-10-CM

## 2021-08-16 DIAGNOSIS — E7800 Pure hypercholesterolemia, unspecified: Secondary | ICD-10-CM

## 2021-08-16 DIAGNOSIS — J9611 Chronic respiratory failure with hypoxia: Secondary | ICD-10-CM | POA: Diagnosis not present

## 2021-08-16 DIAGNOSIS — D509 Iron deficiency anemia, unspecified: Secondary | ICD-10-CM | POA: Diagnosis not present

## 2021-08-16 DIAGNOSIS — Z23 Encounter for immunization: Secondary | ICD-10-CM | POA: Diagnosis not present

## 2021-08-16 DIAGNOSIS — I10 Essential (primary) hypertension: Secondary | ICD-10-CM | POA: Diagnosis not present

## 2021-08-16 NOTE — Progress Notes (Signed)
Provider: Marlowe Sax FNP-C   Fronnie Urton, Nelda Bucks, NP  Patient Care Team: Milany Geck, Nelda Bucks, NP as PCP - General (Family Medicine) Skeet Latch, MD as PCP - Cardiology (Cardiology)  Extended Emergency Contact Information Primary Emergency Contact: [facility] Address: [address]          [place], [place] [address] Johnnette Litter of [place] Work Phone: [phone] Mobile Phone: [phone] Relation: Son Secondary Emergency Contact: Thedore Mins [place] of [place] Phone: [phone] Relation: Brother  Code Status:  DNR Goals of care:  Advanced Directive information Advanced Directives 08/15/2021  Does Patient Have a Medical Advance Directive? Yes  Type of Advance Directive Living will  Does patient want to make changes to medical advance directive? No - Patient declined  Copy of [place] in Chart? -  Would patient like information on creating a medical advance directive? -     Chief Complaint  Patient presents with   Medical Management of Chronic Issues    4 month follow up.   Health Maintenance    Discuss the need for Colonoscopy, and Mammogram.   Immunizations    Discuss the need for Shingrix vaccine, and Influenza vaccine.     HPI:  Pt is a 76 y.o. female seen today for 4 months follow up for medical management of chronic diseases. She is here with the son. States lives with another son and family assist whenever he is at work.continues to do own ADL's. She walks with a Rolling walker. She denies any acute issues today. She is due for mammogram but declines states breast too small.Has age out on colonoscopy. She will receive her influenza vaccine today. Discussed getting shingles vaccine at her pharmacy.     Past Medical History:  Diagnosis Date   Allergic rhinitis    Allergic rhinitis, cause unspecified 09/21/2011   Aortic stenosis 08/23/2016   Arthritis    Asthma    Asthma 09/21/2011   Colon polyps     COPD (chronic obstructive pulmonary disease) (HCC)    mild    Coronary artery calcification 08/23/2016   Dementia (HCC)    Depression with anxiety    HTN (hypertension)    Hyperlipidemia    UTI (lower urinary tract infection)    Past Surgical History:  Procedure Laterality Date   DENTAL SURGERY     pilonidial cyst     TONSILLECTOMY      Allergies  Allergen Reactions   Lipitor [Atorvastatin] Other (See Comments)    Myalgias    Allergies as of 08/16/2021       Reactions   Lipitor [atorvastatin] Other (See Comments)   Myalgias        Medication List        Accurate as of August 16, 2021  3:20 PM. If you have any questions, ask your nurse or doctor.          STOP taking these medications    QUEtiapine 25 MG tablet Commonly known as: SEROQUEL Stopped by: Sandrea Hughs, NP       TAKE these medications    acetaminophen 325 MG tablet Commonly known as: TYLENOL Take 650 mg by mouth every 6 (six) hours as needed for mild pain or headache.   amLODipine 5 MG tablet Commonly known as: NORVASC Take 1 tablet (5 mg total) by mouth daily.   buPROPion 150 MG 24 hr tablet Commonly known as: WELLBUTRIN XL Take 1 tablet (150 mg total) by mouth daily.   clonazePAM  0.5 MG tablet Commonly known as: KLONOPIN Take 1 tablet (0.5 mg total) by mouth 2 (two) times daily as needed for anxiety.   donepezil 5 MG tablet Commonly known as: ARICEPT Take 1 tablet (5 mg total) by mouth at bedtime.   FLUoxetine 40 MG capsule Commonly known as: PROZAC TAKE (1) CAPSULE DAILY.   fluticasone 50 MCG/ACT nasal spray Commonly known as: FLONASE USE TWO SPRAYS in each nostril ONCE DAILY.   Flutter Devi Use as directed   folic acid 1 MG tablet Commonly known as: FOLVITE Take 1 tablet (1 mg total) by mouth daily.   ipratropium 0.02 % nebulizer solution Commonly known as: ATROVENT Take 2.5 mLs (0.5 mg total) by nebulization 3 (three) times daily.   levalbuterol 0.63 MG/3ML  nebulizer solution Commonly known as: XOPENEX USE ONE vial via nebulization every SIX hours as needed FOR SHORTNESS OF BREATH OR wheezing.   melatonin 5 MG Tabs Take 5 mg by mouth at bedtime.   memantine 10 MG tablet Commonly known as: NAMENDA Take 1 tablet (10 mg total) by mouth 2 (two) times daily.   mirtazapine 15 MG tablet Commonly known as: REMERON Take 1 tablet (15 mg total) by mouth at bedtime.   montelukast 10 MG tablet Commonly known as: SINGULAIR Take 1 tablet (10 mg total) by mouth daily.   multivitamin with minerals Tabs tablet Take 1 tablet by mouth daily.   omeprazole 20 MG capsule Commonly known as: PRILOSEC TAKE TWO CAPSULES BY MOUTH EVERY DAY   omeprazole 20 MG tablet Commonly known as: PriLOSEC OTC Take 2 tablets (40 mg total) by mouth daily.   rosuvastatin 20 MG tablet Commonly known as: CRESTOR Take 1 tablet (20 mg total) by mouth daily.   Thera-D 2000 50 MCG (2000 UT) Tabs Generic drug: Cholecalciferol 1 tab by mouth once daily   thiamine 100 MG tablet Take 1 tablet (100 mg total) by mouth daily.   Trelegy Ellipta 200-62.5-25 MCG/ACT Aepb Generic drug: Fluticasone-Umeclidin-Vilant INHALE 1 PUFF INTO THE LUNGS ONCE DAILY AS DIRECTED.   vitamin B-12 1000 MCG tablet Commonly known as: CYANOCOBALAMIN Take 1 tablet (1,000 mcg total) by mouth daily.        Review of Systems  Constitutional:  Negative for appetite change, chills, fatigue, fever and unexpected weight change.  HENT:  Negative for congestion, dental problem, ear discharge, ear pain, facial swelling, hearing loss, nosebleeds, postnasal drip, rhinorrhea, sinus pressure, sinus pain, sneezing, sore throat, tinnitus and trouble swallowing.   Eyes:  Negative for pain, discharge, redness, itching and visual disturbance.  Respiratory:  Negative for cough, chest tightness, shortness of breath and wheezing.   Cardiovascular:  Negative for chest pain, palpitations and leg swelling.   Gastrointestinal:  Negative for abdominal distention, abdominal pain, blood in stool, constipation, diarrhea, nausea and vomiting.  Endocrine: Negative for cold intolerance, heat intolerance, polydipsia, polyphagia and polyuria.  Genitourinary:  Negative for difficulty urinating, dysuria, flank pain, frequency and urgency.  Musculoskeletal:  Positive for gait problem. Negative for arthralgias, back pain, joint swelling, myalgias, neck pain and neck stiffness.  Skin:  Negative for color change, pallor, rash and wound.  Neurological:  Negative for dizziness, syncope, speech difficulty, weakness, light-headedness, numbness and headaches.  Hematological:  Does not bruise/bleed easily.  Psychiatric/Behavioral:  Negative for agitation, behavioral problems, confusion, hallucinations, self-injury, sleep disturbance and suicidal ideas. The patient is not nervous/anxious.    Immunization History  Administered Date(s) Administered   Fluad Quad(high Dose 65+) 04/15/2019, 04/05/2020   Influenza Split  04/11/2011   Influenza Whole 03/31/2008   Influenza, High Dose Seasonal PF 04/29/2013, 03/18/2018   Influenza,inj,Quad PF,6+ Mos 05/07/2014   Influenza-Unspecified 05/13/2015, 04/19/2017   PFIZER(Purple Top)SARS-COV-2 Vaccination 08/01/2019, 08/23/2019, 04/25/2020   Pneumococcal Conjugate-13 05/13/2013   Pneumococcal Polysaccharide-23 03/31/2008, 08/19/2017   Td 02/10/2008   Tdap 12/18/2015, 09/22/2020, 02/26/2021   Zoster Recombinat (Shingrix) 01/25/2017   Zoster, Live 07/09/2005, 01/24/2006   Pertinent  Health Maintenance Due  Topic Date Due   COLONOSCOPY (Pts 45-66yr Insurance coverage will need to be confirmed)  04/19/2020   INFLUENZA VACCINE  02/06/2021   MAMMOGRAM  05/19/2021   DEXA SCAN  Completed   Fall Risk 04/11/2021 04/11/2021 04/17/2021 06/07/2021 08/15/2021  Falls in the past year? - - 0 0 0  Was there an injury with Fall? - - 0 0 0  Was there an injury with Fall? - - - - -  Fall Risk  Category Calculator - - 0 0 0  Fall Risk Category - - Low Low Low  Patient Fall Risk Level High fall risk High fall risk Low fall risk Low fall risk Low fall risk  Patient at Risk for Falls Due to - - No Fall Risks No Fall Risks No Fall Risks  Fall risk Follow up - - Falls evaluation completed Falls evaluation completed Falls evaluation completed   Functional Status Survey:    Vitals:   08/16/21 1513  BP: 108/70  Pulse: (!) 105  Resp: 16  Temp: (!) 97.5 F (36.4 C)  SpO2: 92%  Weight: 103 lb 12.8 oz (47.1 kg)  Height: _0  (1.626 m)   Body mass index is 17.82 kg/m. Physical Exam Vitals reviewed.  Constitutional:      General: She is not in acute distress.    Appearance: Normal appearance. She is normal weight. She is not ill-appearing or diaphoretic.  HENT:     Head: Normocephalic.     Right Ear: Tympanic membrane, ear canal and external ear normal. There is no impacted cerumen.     Left Ear: Tympanic membrane, ear canal and external ear normal. There is no impacted cerumen.     Nose: Nose normal. No congestion or rhinorrhea.     Mouth/Throat:     Mouth: Mucous membranes are moist.     Pharynx: Oropharynx is clear. No oropharyngeal exudate or posterior oropharyngeal erythema.  Eyes:     General: No scleral icterus.       Right eye: No discharge.        Left eye: No discharge.     Extraocular Movements: Extraocular movements intact.     Conjunctiva/sclera: Conjunctivae normal.     Pupils: Pupils are equal, round, and reactive to light.  Neck:     Vascular: No carotid bruit.  Cardiovascular:     Rate and Rhythm: Normal rate and regular rhythm.     Pulses: Normal pulses.     Heart sounds: Normal heart sounds. No murmur heard.   No friction rub. No gallop.  Pulmonary:     Effort: Pulmonary effort is normal. No respiratory distress.     Breath sounds: Normal breath sounds. No wheezing, rhonchi or rales.  Chest:     Chest wall: No tenderness.  Abdominal:     General:  Bowel sounds are normal. There is no distension.     Palpations: Abdomen is soft. There is no mass.     Tenderness: There is no abdominal tenderness. There is no right CVA tenderness, left CVA tenderness, guarding or  rebound.  Musculoskeletal:        General: No swelling or tenderness. Normal range of motion.     Cervical back: Normal range of motion. No rigidity or tenderness.     Right lower leg: No edema.     Left lower leg: No edema.  Lymphadenopathy:     Cervical: No cervical adenopathy.  Skin:    General: Skin is warm and dry.     Coloration: Skin is not pale.     Findings: No bruising, erythema, lesion or rash.  Neurological:     Mental Status: She is alert and oriented to person, place, and time.     Cranial Nerves: No cranial nerve deficit.     Sensory: No sensory deficit.     Motor: No weakness.     Coordination: Coordination normal.     Gait: Gait abnormal.  Psychiatric:        Mood and Affect: Mood normal.        Speech: Speech normal.        Behavior: Behavior normal.        Thought Content: Thought content normal.        Judgment: Judgment normal.    Labs reviewed: Recent Labs    04/07/21 1323 04/08/21 0616 04/09/21 0601 04/10/21 0502 04/11/21 0525 04/17/21 1623  NA 147*   < > 146* 147* 143 142  K 3.9   < > 4.4 4.7 4.2 4.5  CL 115*   < > 115* 113* 106 107  CO2 26   < > 23 29 32 27  GLUCOSE 67*   < > 169* 74 93 93  BUN 14   < > _0 CREATININE 0.83   < > 1.10* 0.89 0.86 0.97  CALCIUM 8.8*   < > 9.0 8.5* 8.8* 8.6  MG 1.9  --  2.1  --   --   --   PHOS 2.7  --   --   --   --   --    < > = values in this interval not displayed.   Recent Labs    10/12/20 1617 04/07/21 1323  AST 29 29  ALT 24 43  ALKPHOS 102 68  BILITOT 0.2 0.5  PROT 6.9 6.8  ALBUMIN 3.7 3.1*   Recent Labs    04/07/21 1323 04/08/21 0616 04/10/21 0502 04/11/21 0525 04/17/21 1623  WBC 6.4   < > 9.0 8.1 14.7*  NEUTROABS 4.8  --  6.2  --  13,833*  HGB 15.6*   < > 10.2*  10.5* 12.1  HCT 50.1*   < > 32.7* 33.2* 37.1  MCV 94.0   < > 95.9 95.4 93.9  PLT 199   < > 242 270 431*   < > = values in this interval not displayed.   Lab Results  Component Value Date   TSH 1.03 10/12/2020   Lab Results  Component Value Date   HGBA1C 4.9 04/05/2020   Lab Results  Component Value Date   CHOL 137 10/12/2020   HDL 52.30 10/12/2020   LDLCALC 59 10/12/2020   LDLDIRECT 124.8 09/21/2011   TRIG 128.0 10/12/2020   CHOLHDL 3 10/12/2020    Significant Diagnostic Results in last 30 days:  No results found.  Assessment/Plan 1. Need for influenza vaccination Afebrile  Flut shot administered by CMA no acute reaction reported.   - Flu Vaccine QUAD High Dose(Fluad)  2. Essential hypertension B/p well controlled - continue on amlodipine   -  CBC with Differential/Platelet - CMP with eGFR([facility]) - Lipid Panel  3. Chronic hypoxemic respiratory failure (HCC) Breathing stable on room air  No wheezing noted  Follow up with Pulmonologist son will reschedule appointment the patient cancelled.  4. Iron deficiency anemia, unspecified iron deficiency anemia type Hgb stable  - continue to monitor  - CBC with Differential/Platelet  5. Dementia with behavioral disturbance No new behavioral issues reported  Has supportive care  Continue on donepezil and Memantine   6. Depression with anxiety Mood stable - continue on Prozac,Bupropion and clonazepam   Continue  - Lipid Panel - TSH  7. Pure hypercholesterolemia Continue on Rosuvastatin    Family/ staff Communication: Reviewed plan of care with patient verbalized understanding   Labs/tests ordered:  - CBC with Differential/Platelet - CMP with eGFR([facility]) - TSH - Lipid panel   Next Appointment : 6 months for medical management of chronic issues.Fasting Labs prior to visit.    Sandrea Hughs, NP

## 2021-08-16 NOTE — Patient Instructions (Signed)
-   continue current medication 

## 2021-08-17 LAB — CBC WITH DIFFERENTIAL/PLATELET
Absolute Monocytes: 740 cells/uL (ref 200–950)
Basophils Absolute: 70 cells/uL (ref 0–200)
Basophils Relative: 0.8 %
Eosinophils Absolute: 191 cells/uL (ref 15–500)
Eosinophils Relative: 2.2 %
HCT: 42.5 % (ref 35.0–45.0)
Hemoglobin: 13.9 g/dL (ref 11.7–15.5)
Lymphs Abs: 1662 cells/uL (ref 850–3900)
MCH: 30.1 pg (ref 27.0–33.0)
MCHC: 32.7 g/dL (ref 32.0–36.0)
MCV: 92 fL (ref 80.0–100.0)
MPV: 10.2 fL (ref 7.5–12.5)
Monocytes Relative: 8.5 %
Neutro Abs: 6038 cells/uL (ref 1500–7800)
Neutrophils Relative %: 69.4 %
Platelets: 318 10*3/uL (ref 140–400)
RBC: 4.62 10*6/uL (ref 3.80–5.10)
RDW: 12.1 % (ref 11.0–15.0)
Total Lymphocyte: 19.1 %
WBC: 8.7 10*3/uL (ref 3.8–10.8)

## 2021-08-17 LAB — LIPID PANEL
Cholesterol: 159 mg/dL (ref <200)
HDL: 63 mg/dL (ref >=50)
LDL Cholesterol (Calc): 73 mg/dL (calc)
Non-HDL Cholesterol (Calc): 96 mg/dL (calc) (ref <130)
Total CHOL/HDL Ratio: 2.5 (calc) (ref <5.0)
Triglycerides: 157 mg/dL — ABNORMAL HIGH (ref <150)

## 2021-08-17 LAB — COMPLETE METABOLIC PANEL WITH GFR
AG Ratio: 1.6 (calc) (ref 1.0–2.5)
ALT: 22 U/L (ref 6–29)
AST: 25 U/L (ref 10–35)
Albumin: 3.9 g/dL (ref 3.6–5.1)
Alkaline phosphatase (APISO): 69 U/L (ref 37–153)
BUN/Creatinine Ratio: 12 (calc) (ref 6–22)
BUN: 13 mg/dL (ref 7–25)
CO2: 30 mmol/L (ref 20–32)
Calcium: 9.4 mg/dL (ref 8.6–10.4)
Chloride: 104 mmol/L (ref 98–110)
Creat: 1.08 mg/dL — ABNORMAL HIGH (ref 0.60–1.00)
Globulin: 2.4 g/dL (calc) (ref 1.9–3.7)
Glucose, Bld: 79 mg/dL (ref 65–139)
Potassium: 4.3 mmol/L (ref 3.5–5.3)
Sodium: 141 mmol/L (ref 135–146)
Total Bilirubin: 0.4 mg/dL (ref 0.2–1.2)
Total Protein: 6.3 g/dL (ref 6.1–8.1)
eGFR: 53 mL/min/{1.73_m2} — ABNORMAL LOW (ref >=60)

## 2021-08-17 LAB — TSH: TSH: 0.86 mIU/L (ref 0.40–4.50)

## 2021-08-22 ENCOUNTER — Other Ambulatory Visit: Payer: Self-pay

## 2021-08-22 DIAGNOSIS — I10 Essential (primary) hypertension: Secondary | ICD-10-CM

## 2021-08-22 DIAGNOSIS — D509 Iron deficiency anemia, unspecified: Secondary | ICD-10-CM

## 2021-08-22 DIAGNOSIS — F418 Other specified anxiety disorders: Secondary | ICD-10-CM

## 2021-08-25 ENCOUNTER — Other Ambulatory Visit: Payer: Self-pay | Admitting: Family

## 2021-08-25 NOTE — Telephone Encounter (Signed)
Patient has request refill on medication "Ipratropium Nebulizer". Patient last refill dated 04/11/2021. Patient medication has warnings. Medication pend and sent to PCP Ngetich, Nelda Bucks, NP for approval.

## 2021-09-15 ENCOUNTER — Telehealth: Payer: Self-pay

## 2021-09-15 ENCOUNTER — Other Ambulatory Visit (HOSPITAL_COMMUNITY): Payer: Self-pay

## 2021-09-15 NOTE — Telephone Encounter (Signed)
Patient Advocate Encounter ?  ?Received notification from [facility] that prior authorization for Trelegy Ellipta 200-62.5-25mcg is required by his/her insurance [employer]. ?  ?PA submitted on 09/15/21 ? ?Key#:  [id number] ? ?Status is pending ?   ?[facility] will continue to follow: ? ?Patient Advocate ?Fax: [phone]  ?

## 2021-09-16 ENCOUNTER — Other Ambulatory Visit: Payer: Self-pay | Admitting: Family

## 2021-09-16 ENCOUNTER — Other Ambulatory Visit: Payer: Self-pay | Admitting: Internal Medicine

## 2021-09-18 ENCOUNTER — Other Ambulatory Visit (HOSPITAL_COMMUNITY): Payer: Self-pay

## 2021-09-18 NOTE — Telephone Encounter (Signed)
Received notification from [facility]) regarding a prior authorization for TRELEGY. Authorization has been APPROVED from 3.10.23 to 12.31.23.  ? ?Per test claim, copay for 30 days supply is $45 ? ? ?Authorization # PA Case: [id number] ? ? ?

## 2021-09-18 NOTE — Telephone Encounter (Signed)
ATC patient, but no answer and no voice mail received. ?

## 2021-09-19 NOTE — Telephone Encounter (Signed)
Called listed number and spoke with patient son Gwyndolyn Saxon ([place]).  Gwyndolyn Saxon is aware of Trelegy inhaler approval.  Nothing further at this time. ?

## 2021-09-27 ENCOUNTER — Other Ambulatory Visit: Payer: Self-pay | Admitting: Family

## 2021-09-27 NOTE — Telephone Encounter (Signed)
Patient has request refill on medication "Ipratopium". Medication has warnings. Medication pend and sent to PCP Ngetich, Nelda Bucks, NP for approval.  ?

## 2021-09-28 NOTE — Telephone Encounter (Signed)
Please consult with Pulmonologist for any other alternatives or samples.   ?

## 2021-09-28 NOTE — Telephone Encounter (Signed)
Received fax from [facility] stating that Medication Ipratropium Bromide solution is Currently on backordered with no availability date in sight. Caregiver asked the pharmacy to reach out to you to see if there is an alternative Rx you could prescribe instead.  ? ?Please Advise.  ?

## 2021-09-29 NOTE — Telephone Encounter (Signed)
Andrea Larson, Son, notified and agreed. Stated that he will contact the [place] office.  ?

## 2021-10-02 ENCOUNTER — Telehealth: Payer: Self-pay | Admitting: *Deleted

## 2021-10-02 ENCOUNTER — Other Ambulatory Visit: Payer: Self-pay | Admitting: Family

## 2021-10-02 NOTE — Telephone Encounter (Signed)
Received Fax from [facility] requesting Physician Cognitive History form to be filled out for [facility] [phone].  ? ?To be faxed back to Fax: [phone] [facility]eds once completed. [name] has already faxed the Medical Records.  ? ?Case: [id number] ?Placed form in Andrea Larson's folder to review, complete and sign.  ?

## 2021-10-03 ENCOUNTER — Telehealth: Payer: Self-pay

## 2021-10-03 ENCOUNTER — Telehealth: Payer: Self-pay | Admitting: Family

## 2021-10-03 NOTE — Telephone Encounter (Signed)
Spoke to patients son, Gwyndolyn Saxon and scheduled appointment to discuss long-term care insurance and MMSE for 10/04/21 at 2:15 with Ngetich, Nelda Bucks, NP ? ?

## 2021-10-03 NOTE — Telephone Encounter (Signed)
Claim supporting document request forms for memory received.Please schedule appointment for evaluation and to complete forms. ?

## 2021-10-03 NOTE — Telephone Encounter (Signed)
Andrea Larson gave forms to [place] and she is going to call patient to schedule an appointment for evaluation.  ?

## 2021-10-04 ENCOUNTER — Ambulatory Visit (INDEPENDENT_AMBULATORY_CARE_PROVIDER_SITE_OTHER): Payer: Medicare HMO | Admitting: Family

## 2021-10-04 VITALS — BP 104/70 | HR 86 | Temp 97.0°F | Resp 14 | Ht 64.0 in | Wt 111.4 lb

## 2021-10-04 DIAGNOSIS — F03918 Unspecified dementia, unspecified severity, with other behavioral disturbance: Secondary | ICD-10-CM

## 2021-10-04 DIAGNOSIS — R2681 Unsteadiness on feet: Secondary | ICD-10-CM

## 2021-10-04 DIAGNOSIS — Z0289 Encounter for other administrative examinations: Secondary | ICD-10-CM

## 2021-10-04 NOTE — Progress Notes (Signed)
? ?Provider: Marlowe Sax FNP-C ? ?Arrielle Mcginn, Nelda Bucks, NP ? ?Patient Care Team: ?Yuvaan Olander, Nelda Bucks, NP as PCP - General (Family Medicine) ?Skeet Latch, MD as PCP - Cardiology (Cardiology) ? ?Extended Emergency Contact Information ?Primary Emergency Contact: Hollifield,William ?Address: [address] ?         [place],  [address] [place] ?Work Phone: [phone] ?Mobile Phone: [phone] ?Relation: Son ?Secondary Emergency Contact: Starr,John ? [place] ?Home Phone: [phone] ?Relation: Brother ? ?Code Status:  DNR ?Goals of care: Advanced Directive information ? ?  08/15/2021  ?  4:51 PM  ?Advanced Directives  ?Does Patient Have a Medical Advance Directive? Yes  ?Type of Advance Directive Living will  ?Does patient want to make changes to medical advance directive? No - Patient declined  ? ? ? ?Chief Complaint  ?Patient presents with  ? Form Completion  ?  Visit to complete physician cognitive history form and MMSE.   ? ? ?HPI:  ?Pt is a 76 y.o. female seen today for an acute visit for cognitive evaluation and complete cognitive form.  She is here with her son today.  Son states since patient moved into live with him her memory has greatly improved.  States patient has been eating well and unlike when she used to live with friends in a bad situation she was not eating well and drinking lots of alcohol which messed up her memory.  No longer drinking alcohol and eating all 3 meals.  She requires assistance with activities of daily living such as cooking and standby assistant with bathing and dressing due to unsteady gait.Marland Kitchen ? ?She denies any acute illness also denies any fever or chills.Still gets agitated at times. ? ? ?Past Medical History:  ?Diagnosis Date  ? Allergic rhinitis   ? Allergic rhinitis, cause unspecified 09/21/2011  ? Aortic stenosis 08/23/2016  ? Arthritis   ? Asthma   ? Asthma 09/21/2011  ? Colon polyps   ? COPD (chronic obstructive pulmonary  disease) ([place])   ? mild   ? Coronary artery calcification 08/23/2016  ? Dementia ([place])   ? Depression with anxiety   ? HTN (hypertension)   ? Hyperlipidemia   ? UTI (lower urinary tract infection)   ? ?Past Surgical History:  ?Procedure Laterality Date  ? DENTAL SURGERY    ? pilonidial cyst    ? TONSILLECTOMY    ? ? ?Allergies  ?Allergen Reactions  ? Lipitor [Atorvastatin] Other (See Comments)  ?  Myalgias  ? ? ?Outpatient Encounter Medications as of 10/04/2021  ?Medication Sig  ? acetaminophen (TYLENOL) 325 MG tablet Take 650 mg by mouth every 6 (six) hours as needed for mild pain or headache.  ? amLODipine (NORVASC) 5 MG tablet Take 1 tablet (5 mg total) by mouth daily.  ? Cholecalciferol (THERA-D 2000) 50 MCG (2000 UT) TABS 1 tab by mouth once daily  ? donepezil (ARICEPT) 5 MG tablet Take 1 tablet (5 mg total) by mouth at bedtime.  ? fluticasone (FLONASE) 50 MCG/ACT nasal spray USE TWO SPRAYS in each nostril ONCE DAILY.  ? folic acid (FOLVITE) 1 MG tablet Take 1 tablet (1 mg total) by mouth daily.  ? melatonin 5 MG TABS Take 5 mg by mouth at bedtime.  ? memantine (NAMENDA) 10 MG tablet TAKE ONE TABLET BY MOUTH TWICE DAILY  ? mirtazapine (REMERON) 15 MG tablet Take 1 tablet (15 mg total) by mouth at bedtime.  ? montelukast (SINGULAIR) 10 MG tablet Take 1  tablet (10 mg total) by mouth daily.  ? Multiple Vitamin (MULTIVITAMIN WITH MINERALS) TABS tablet Take 1 tablet by mouth daily.  ? omeprazole (PRILOSEC OTC) 20 MG tablet Take 2 tablets (40 mg total) by mouth daily.  ? omeprazole (PRILOSEC) 20 MG capsule TAKE TWO CAPSULES BY MOUTH EVERY DAY  ? rosuvastatin (CRESTOR) 20 MG tablet Take 1 tablet (20 mg total) by mouth daily.  ? thiamine 100 MG tablet Take 1 tablet (100 mg total) by mouth daily.  ? TRELEGY ELLIPTA 200-62.5-25 MCG/INH AEPB INHALE 1 PUFF INTO THE LUNGS ONCE DAILY AS DIRECTED.  ? vitamin B-12 (CYANOCOBALAMIN) 1000 MCG tablet Take 1 tablet (1,000 mcg total) by mouth daily. (Patient taking differently: Take  1,000 mcg by mouth daily. Milligrams unknown)  ? buPROPion (WELLBUTRIN XL) 150 MG 24 hr tablet Take 1 tablet (150 mg total) by mouth daily.  ? clonazePAM (KLONOPIN) 0.5 MG tablet Take 1 tablet (0.5 mg total) by mouth 2 (two) times daily as needed for anxiety. (Patient not taking: Reported on 10/04/2021)  ? FLUoxetine (PROZAC) 40 MG capsule TAKE (1) CAPSULE DAILY.  ? ipratropium (ATROVENT) 0.02 % nebulizer solution USE ONE vial via nebulization THREE TIMES DAILY (Patient not taking: Reported on 10/04/2021)  ? levalbuterol (XOPENEX) 0.63 MG/3ML nebulizer solution USE ONE vial via nebulization every SIX hours as needed FOR SHORTNESS OF BREATH OR wheezing.  ? Respiratory Therapy Supplies (FLUTTER) DEVI Use as directed (Patient not taking: Reported on 10/04/2021)  ? ?No facility-administered encounter medications on file as of 10/04/2021.  ? ? ?Review of Systems  ?Constitutional:  Negative for appetite change, chills, fatigue, fever and unexpected weight change.  ?HENT:  Negative for congestion, dental problem, ear discharge, ear pain, facial swelling, hearing loss, nosebleeds, postnasal drip, rhinorrhea, sinus pressure, sinus pain, sneezing and sore throat.   ?Eyes:  Negative for pain, discharge, redness, itching and visual disturbance.  ?Respiratory:  Negative for cough, chest tightness, shortness of breath and wheezing.   ?Cardiovascular:  Negative for chest pain, palpitations and leg swelling.  ?Gastrointestinal:  Negative for abdominal distention, abdominal pain, blood in stool, constipation, diarrhea, nausea and vomiting.  ?Genitourinary:  Negative for difficulty urinating, dysuria, flank pain and urgency.  ?     Incontinent for bladder wears adult pull ups   ?Musculoskeletal:  Positive for gait problem. Negative for arthralgias, back pain, joint swelling, myalgias, neck pain and neck stiffness.  ?Skin:  Negative for color change, pallor and rash.  ?Neurological:  Negative for dizziness, speech difficulty, weakness,  light-headedness, numbness and headaches.  ?Hematological:  Does not bruise/bleed easily.  ?Psychiatric/Behavioral:  Negative for agitation, behavioral problems, confusion, hallucinations and sleep disturbance. The patient is not nervous/anxious.   ?     Memory lapse   ? ?Immunization History  ?Administered Date(s) Administered  ? Fluad Quad(high Dose 65+) 04/15/2019, 04/05/2020, 08/16/2021  ? Influenza Split 04/11/2011  ? Influenza Whole 03/31/2008  ? Influenza, High Dose Seasonal PF 04/29/2013, 03/18/2018  ? Influenza,inj,Quad PF,6+ Mos 05/07/2014  ? Influenza-Unspecified 05/13/2015, 04/19/2017  ? PFIZER(Purple Top)SARS-COV-2 Vaccination 08/01/2019, 08/23/2019, 04/25/2020  ? Pneumococcal Conjugate-13 05/13/2013  ? Pneumococcal Polysaccharide-23 03/31/2008, 08/19/2017  ? Td 02/10/2008  ? Tdap 12/18/2015, 09/22/2020, 02/26/2021  ? Zoster Recombinat (Shingrix) 01/25/2017  ? Zoster, Live 07/09/2005, 01/24/2006  ? ?Pertinent  Health Maintenance Due  ?Topic Date Due  ? INFLUENZA VACCINE  Completed  ? DEXA SCAN  Completed  ? MAMMOGRAM  Discontinued  ? COLONOSCOPY (Pts 45-41yr Insurance coverage will need to be confirmed)  Discontinued  ? ? ?  04/11/2021  ? 12:00 AM 04/11/2021  ? 12:00 PM 04/17/2021  ?  3:46 PM 06/07/2021  ?  3:49 PM 08/15/2021  ?  4:50 PM  ?Fall Risk  ?Falls in the past year?   0 0 0  ?Was there an injury with Fall?   0 0 0  ?Fall Risk Category Calculator   0 0 0  ?Fall Risk Category   Low Low Low  ?Patient Fall Risk Level High fall risk High fall risk Low fall risk Low fall risk Low fall risk  ?Patient at Risk for Falls Due to   No Fall Risks No Fall Risks No Fall Risks  ?Fall risk Follow up   Falls evaluation completed Falls evaluation completed Falls evaluation completed  ? ?Functional Status Survey: ?  ? ?Vitals:  ? 10/04/21 1440  ?BP: 104/70  ?Pulse: 86  ?Resp: 14  ?Temp: (!) 97 ?F (36.1 ?C)  ?SpO2: 95%  ?Weight: 111 lb 6.4 oz (50.5 kg)  ?Height: '5\' 4"'$  (1.626 m)  ? ?Body mass index is 19.12  kg/m?Marland Kitchen ?Physical Exam ?Vitals reviewed.  ?Constitutional:   ?   General: She is not in acute distress. ?   Appearance: Normal appearance. She is normal weight. She is not ill-appearing or diaphoretic.  ?HENT:  ?   Head:

## 2021-10-05 NOTE — Telephone Encounter (Signed)
Physician Cognitive History Form filled out and signed and faxed back to Fax: [phone] ?

## 2021-10-07 ENCOUNTER — Other Ambulatory Visit: Payer: Self-pay | Admitting: Internal Medicine

## 2021-10-07 ENCOUNTER — Other Ambulatory Visit: Payer: Self-pay | Admitting: Family

## 2021-10-18 ENCOUNTER — Other Ambulatory Visit: Payer: Self-pay | Admitting: Family

## 2021-10-30 ENCOUNTER — Other Ambulatory Visit: Payer: Self-pay | Admitting: Internal Medicine

## 2021-10-30 ENCOUNTER — Other Ambulatory Visit: Payer: Self-pay | Admitting: Family

## 2021-10-30 NOTE — Telephone Encounter (Signed)
To PCP please ?

## 2021-11-13 ENCOUNTER — Other Ambulatory Visit: Payer: Self-pay | Admitting: Family

## 2021-11-13 ENCOUNTER — Other Ambulatory Visit: Payer: Self-pay | Admitting: *Deleted

## 2021-11-13 ENCOUNTER — Other Ambulatory Visit: Payer: Self-pay | Admitting: Internal Medicine

## 2021-11-13 DIAGNOSIS — Z87891 Personal history of nicotine dependence: Secondary | ICD-10-CM

## 2021-11-13 DIAGNOSIS — E440 Moderate protein-calorie malnutrition: Secondary | ICD-10-CM

## 2021-11-13 DIAGNOSIS — Z122 Encounter for screening for malignant neoplasm of respiratory organs: Secondary | ICD-10-CM

## 2021-11-13 DIAGNOSIS — F0390 Unspecified dementia without behavioral disturbance: Secondary | ICD-10-CM

## 2021-11-13 NOTE — Telephone Encounter (Signed)
Please refill as per office routine med refill policy (all routine meds to be refilled for 3 mo or monthly (per pt preference) up to one year from last visit, then month to month grace period for 3 mo, then further med refills will have to be denied) ? ?

## 2021-11-13 NOTE — Telephone Encounter (Signed)
Patient requested for medication Seroquel '25mg'$  be removed from medication list during appointment. Patient is now requesting for medication to be refilled through pharmacy. Patient pharmacy note reads as follows "Patient is now enrolled in the medsync program for 90 day supply to keep her compliant. She only has 68 tabs lefton current script that we need to fill today. Please send new RX. Thank you". Medication pend and sent to PCP Ngetich, Nelda Bucks, NP for approval.  ?

## 2021-11-14 ENCOUNTER — Other Ambulatory Visit: Payer: Self-pay | Admitting: Family

## 2021-11-23 ENCOUNTER — Inpatient Hospital Stay: Admission: RE | Admit: 2021-11-23 | Payer: Medicare HMO | Source: Ambulatory Visit

## 2021-11-29 ENCOUNTER — Ambulatory Visit: Payer: Medicare HMO | Admitting: Pulmonary Disease

## 2021-12-01 ENCOUNTER — Ambulatory Visit (INDEPENDENT_AMBULATORY_CARE_PROVIDER_SITE_OTHER)
Admission: RE | Admit: 2021-12-01 | Discharge: 2021-12-01 | Disposition: A | Discharge status: Home / Self Care | Payer: Medicare HMO | Source: Ambulatory Visit | Attending: Acute Care | Admitting: Acute Care

## 2021-12-01 DIAGNOSIS — Z87891 Personal history of nicotine dependence: Secondary | ICD-10-CM

## 2021-12-01 DIAGNOSIS — Z122 Encounter for screening for malignant neoplasm of respiratory organs: Secondary | ICD-10-CM

## 2021-12-03 ENCOUNTER — Other Ambulatory Visit: Payer: Self-pay | Admitting: Pulmonary Disease

## 2021-12-03 DIAGNOSIS — J449 Chronic obstructive pulmonary disease, unspecified: Secondary | ICD-10-CM

## 2021-12-05 ENCOUNTER — Telehealth: Payer: Self-pay | Admitting: Acute Care

## 2021-12-05 ENCOUNTER — Other Ambulatory Visit: Payer: Self-pay | Admitting: Family

## 2021-12-05 DIAGNOSIS — R911 Solitary pulmonary nodule: Secondary | ICD-10-CM

## 2021-12-05 DIAGNOSIS — J449 Chronic obstructive pulmonary disease, unspecified: Secondary | ICD-10-CM

## 2021-12-05 NOTE — Telephone Encounter (Signed)
Call report on LDCT done 12/01/21  Impression:  IMPRESSION: 1. Lung-RADS 4B, suspicious. Additional imaging evaluation or consultation with Pulmonology or Thoracic Surgery recommended. Numerous new solid pulmonary nodules scattered throughout both lungs, largest 9.8 mm in volume derived mean diameter in the peripheral left lower lobe, some of which appear clustered in a tree-in-bud configuration, potentially inflammatory. Follow up low-dose chest CT without contrast in 3 months (please use the following order, "CT CHEST LCS NODULE FOLLOW-UP W/O CM") is recommended. 2. Two-vessel coronary atherosclerosis. 3. Aortic Atherosclerosis (ICD10-I70.0) and Emphysema (ICD10-J43.9).   These results will be called to the ordering clinician or representative by the Radiologist Assistant, and communication documented in the PACS or [employer].     Electronically Signed   By: Ilona Sorrel M.D.   On: 12/04/2021 12:21

## 2021-12-05 NOTE — Telephone Encounter (Signed)
I have attempted to call the patient with the results of their  Low Dose CT Chest Lung cancer screening scan. There was no answer. I have left a HIPPA compliant VM requesting the patient call the office for the scan results. I included the office contact information in the message. We will await his return call. If no return call we will continue to call until patient is contacted.    Pt. Has dementia, so I have left the message  on the sons phone [phone]. He should call back to our [phone] number. She needs a 3 month follow up. This looks infectious. Please ask if she has been sick. Thanks so much

## 2021-12-05 NOTE — Telephone Encounter (Signed)
Patient has request refill on medication Trelegy. Patient last refill dated 03/19/2021. Patient medication has Warnings. Medication pend and sent to PCP Ngetich, Nelda Bucks, NP for approval.

## 2021-12-06 NOTE — Telephone Encounter (Signed)
Left detailed message for pt's son, Gwyndolyn Saxon [facility]) at both work and mobile numbers asking him to call us back to discuss pt's recent CT results. Will await call back.

## 2021-12-08 NOTE — Telephone Encounter (Signed)
Spoke with pt's son, Marton Redwood [facility]) and advised of pt's recent lung screening CT results. I explained that pt has a new nodule area on this CT that wasn't on her previous lung screening CT scan. I explained that Dr Valeta Harms as advised for pt to have a PET scan to further evaluate this area. He verbalized understanding. PET scan order has been placed with instructions to contact pt's son with appointment information. Copy of CT scan faxed to PCP with f/u plans included.

## 2021-12-18 NOTE — Telephone Encounter (Signed)
Spoke with pt's son, Gwyndolyn Saxon [facility]) to verify pt's appts for PET scan and pulmonary office f/u. He stated that he would be out of town on 12/20/21 the day that PET scan is scheduled and would not be back until 12/25/21 and he needs to have PET rescheduled. Pet has been moved to 12/27/21 at 12:00 and appt made with Eric Form, NP 12/29/21 to f/u after PET. He verbalized understanding.

## 2021-12-20 ENCOUNTER — Encounter (HOSPITAL_COMMUNITY): Payer: Medicare HMO

## 2021-12-26 ENCOUNTER — Telehealth: Payer: Self-pay

## 2021-12-26 NOTE — Telephone Encounter (Signed)
(  4:05 pm) PC SW left a message for patient's son-William requesting a call back.

## 2021-12-27 ENCOUNTER — Encounter (HOSPITAL_COMMUNITY)
Admission: RE | Admit: 2021-12-27 | Discharge: 2021-12-27 | Disposition: A | Discharge status: Home / Self Care | Payer: Medicare HMO | Source: Ambulatory Visit | Attending: Pulmonary Disease | Admitting: Pulmonary Disease

## 2021-12-27 DIAGNOSIS — K429 Umbilical hernia without obstruction or gangrene: Secondary | ICD-10-CM | POA: Diagnosis not present

## 2021-12-27 DIAGNOSIS — R911 Solitary pulmonary nodule: Secondary | ICD-10-CM

## 2021-12-27 DIAGNOSIS — I2510 Atherosclerotic heart disease of native coronary artery without angina pectoris: Secondary | ICD-10-CM | POA: Diagnosis not present

## 2021-12-27 DIAGNOSIS — J432 Centrilobular emphysema: Secondary | ICD-10-CM | POA: Diagnosis not present

## 2021-12-27 DIAGNOSIS — K8020 Calculus of gallbladder without cholecystitis without obstruction: Secondary | ICD-10-CM | POA: Diagnosis not present

## 2021-12-27 LAB — GLUCOSE, CAPILLARY: Glucose-Capillary: 87 mg/dL (ref 70–99)

## 2021-12-27 MED ORDER — FLUDEOXYGLUCOSE F - 18 (FDG) INJECTION
5.5300 | Freq: Once | INTRAVENOUS | Status: AC
  Administered 2021-12-27: 5.53 via INTRAVENOUS

## 2021-12-29 ENCOUNTER — Encounter: Payer: Self-pay | Admitting: Acute Care

## 2021-12-29 ENCOUNTER — Ambulatory Visit: Payer: Medicare HMO | Admitting: Acute Care

## 2021-12-29 VITALS — BP 120/68 | HR 85 | Temp 98.0°F | Ht 62.0 in | Wt 112.0 lb

## 2021-12-29 DIAGNOSIS — R911 Solitary pulmonary nodule: Secondary | ICD-10-CM

## 2021-12-29 DIAGNOSIS — F17200 Nicotine dependence, unspecified, uncomplicated: Secondary | ICD-10-CM | POA: Diagnosis not present

## 2022-01-10 ENCOUNTER — Other Ambulatory Visit: Payer: Self-pay | Admitting: Family

## 2022-01-10 DIAGNOSIS — E440 Moderate protein-calorie malnutrition: Secondary | ICD-10-CM

## 2022-02-07 ENCOUNTER — Other Ambulatory Visit: Payer: Self-pay | Admitting: Family

## 2022-02-07 DIAGNOSIS — E440 Moderate protein-calorie malnutrition: Secondary | ICD-10-CM

## 2022-02-07 MED ORDER — MIRTAZAPINE 15 MG PO TABS: 15.0000 mg | ORAL_TABLET | Freq: Every day | ORAL | 1 refills | Status: DC

## 2022-02-07 NOTE — Telephone Encounter (Signed)
Patient has request refill on medications Fluoxetine, and Remeron. Patient medications refilled February 2023. Both medications are due for refill. Medications has High Risk Warnings. Both pend and sent to PCP Ngetich, Nelda Bucks, NP for approval.

## 2022-02-13 ENCOUNTER — Other Ambulatory Visit: Payer: Medicare HMO

## 2022-02-15 ENCOUNTER — Ambulatory Visit: Payer: Medicare HMO | Admitting: Family

## 2022-02-20 ENCOUNTER — Other Ambulatory Visit: Payer: Medicare HMO

## 2022-02-20 DIAGNOSIS — I10 Essential (primary) hypertension: Secondary | ICD-10-CM | POA: Diagnosis not present

## 2022-02-20 DIAGNOSIS — F418 Other specified anxiety disorders: Secondary | ICD-10-CM | POA: Diagnosis not present

## 2022-02-20 DIAGNOSIS — D509 Iron deficiency anemia, unspecified: Secondary | ICD-10-CM | POA: Diagnosis not present

## 2022-02-21 LAB — CBC WITH DIFFERENTIAL/PLATELET
Absolute Monocytes: 721 cells/uL (ref 200–950)
Basophils Absolute: 82 cells/uL (ref 0–200)
Basophils Relative: 0.8 %
Eosinophils Absolute: 175 cells/uL (ref 15–500)
Eosinophils Relative: 1.7 %
HCT: 42 % (ref 35.0–45.0)
Hemoglobin: 13.9 g/dL (ref 11.7–15.5)
Lymphs Abs: 1504 cells/uL (ref 850–3900)
MCH: 30.2 pg (ref 27.0–33.0)
MCHC: 33.1 g/dL (ref 32.0–36.0)
MCV: 91.3 fL (ref 80.0–100.0)
MPV: 10.4 fL (ref 7.5–12.5)
Monocytes Relative: 7 %
Neutro Abs: 7818 cells/uL — ABNORMAL HIGH (ref 1500–7800)
Neutrophils Relative %: 75.9 %
Platelets: 297 10*3/uL (ref 140–400)
RBC: 4.6 10*6/uL (ref 3.80–5.10)
RDW: 12.4 % (ref 11.0–15.0)
Total Lymphocyte: 14.6 %
WBC: 10.3 10*3/uL (ref 3.8–10.8)

## 2022-02-21 LAB — BASIC METABOLIC PANEL WITH GFR
BUN/Creatinine Ratio: 13 (calc) (ref 6–22)
BUN: 17 mg/dL (ref 7–25)
CO2: 27 mmol/L (ref 20–32)
Calcium: 9 mg/dL (ref 8.6–10.4)
Chloride: 103 mmol/L (ref 98–110)
Creat: 1.27 mg/dL — ABNORMAL HIGH (ref 0.60–1.00)
Glucose, Bld: 80 mg/dL (ref 65–139)
Potassium: 4.8 mmol/L (ref 3.5–5.3)
Sodium: 140 mmol/L (ref 135–146)
eGFR: 43 mL/min/{1.73_m2} — ABNORMAL LOW (ref >=60)

## 2022-02-21 LAB — LIPID PANEL
Cholesterol: 173 mg/dL (ref <200)
HDL: 67 mg/dL (ref >=50)
LDL Cholesterol (Calc): 81 mg/dL (calc)
Non-HDL Cholesterol (Calc): 106 mg/dL (calc) (ref <130)
Total CHOL/HDL Ratio: 2.6 (calc) (ref <5.0)
Triglycerides: 146 mg/dL (ref <150)

## 2022-02-22 ENCOUNTER — Encounter: Payer: Medicare HMO | Admitting: Family

## 2022-02-25 NOTE — Progress Notes (Signed)
  This encounter was created in error - please disregard. No show 

## 2022-03-05 ENCOUNTER — Encounter: Payer: Self-pay | Admitting: Family

## 2022-03-05 ENCOUNTER — Ambulatory Visit (INDEPENDENT_AMBULATORY_CARE_PROVIDER_SITE_OTHER): Payer: Medicare HMO | Admitting: Family

## 2022-03-05 VITALS — BP 120/80 | HR 98 | Temp 97.5°F | Resp 16 | Ht 62.0 in | Wt 119.6 lb

## 2022-03-05 DIAGNOSIS — I700 Atherosclerosis of aorta: Secondary | ICD-10-CM

## 2022-03-05 DIAGNOSIS — E538 Deficiency of other specified B group vitamins: Secondary | ICD-10-CM | POA: Diagnosis not present

## 2022-03-05 DIAGNOSIS — E7800 Pure hypercholesterolemia, unspecified: Secondary | ICD-10-CM

## 2022-03-05 DIAGNOSIS — J449 Chronic obstructive pulmonary disease, unspecified: Secondary | ICD-10-CM | POA: Diagnosis not present

## 2022-03-05 DIAGNOSIS — F418 Other specified anxiety disorders: Secondary | ICD-10-CM

## 2022-03-05 DIAGNOSIS — I10 Essential (primary) hypertension: Secondary | ICD-10-CM

## 2022-03-05 DIAGNOSIS — F1027 Alcohol dependence with alcohol-induced persisting dementia: Secondary | ICD-10-CM | POA: Diagnosis not present

## 2022-03-05 DIAGNOSIS — E43 Unspecified severe protein-calorie malnutrition: Secondary | ICD-10-CM | POA: Diagnosis not present

## 2022-03-05 NOTE — Progress Notes (Signed)
Provider: Marlowe Sax FNP-C   Jaymari Cromie, Nelda Bucks, NP  Patient Care Team: Andre Swander, Nelda Bucks, NP as PCP - General (Family Medicine) Skeet Latch, MD as PCP - Cardiology (Cardiology)  Extended Emergency Contact Information Primary Emergency Contact: [facility] Address: [address]          [place], [place] [address] Johnnette Litter of [place] Work Phone: [phone] Mobile Phone: [phone] Relation: Son Secondary Emergency Contact: Thedore Mins [place] of [place] Phone: [phone] Relation: Brother  Code Status:  DNR Goals of care: Advanced Directive information    03/05/2022    2:04 PM  Advanced Directives  Does Patient Have a Medical Advance Directive? Yes  Type of Paramedic of [place];Living will  Does patient want to make changes to medical advance directive? No - Patient declined  Copy of [place] in Chart? No - copy requested     Chief Complaint  Patient presents with   Medical Management of Chronic Issues    6 month follow up.     Review Labs    Discuss recent labs    Immunizations    Discuss the need for Influenza vaccine, and Shingrix vaccine.     HPI:  Pt is a 76 y.o. female seen today for 6 months follow up for medical management of chronic diseases as below. Recent lab results reviewed and discussed with patient and son. TRG have improved 146 previous 157   CR 1.27,GFR 43  previous 1.08,GFR 53  Appetite described as good. Has had 7 pound weight gain since last visit.  Past Medical History:  Diagnosis Date   Allergic rhinitis    Allergic rhinitis, cause unspecified 09/21/2011   Aortic stenosis 08/23/2016   Arthritis    Asthma    Asthma 09/21/2011   Colon polyps    COPD (chronic obstructive pulmonary disease) (HCC)    mild    Coronary artery calcification 08/23/2016   Dementia (HCC)    Depression with anxiety    HTN (hypertension)    Hyperlipidemia    UTI (lower  urinary tract infection)    Past Surgical History:  Procedure Laterality Date   DENTAL SURGERY     pilonidial cyst     TONSILLECTOMY      Allergies  Allergen Reactions   Lipitor [Atorvastatin] Other (See Comments)    Myalgias    Allergies as of 03/05/2022       Reactions   Lipitor [atorvastatin] Other (See Comments)   Myalgias        Medication List        Accurate as of March 05, 2022  2:08 PM. If you have any questions, ask your nurse or doctor.          acetaminophen 325 MG tablet Commonly known as: TYLENOL Take 650 mg by mouth every 6 (six) hours as needed for mild pain or headache.   amLODipine 5 MG tablet Commonly known as: NORVASC TAKE ONE TABLET BY MOUTH DAILY   buPROPion 150 MG 24 hr tablet Commonly known as: WELLBUTRIN XL TAKE ONE TABLET BY MOUTH DAILY   clonazePAM 0.5 MG tablet Commonly known as: KLONOPIN Take 1 tablet (0.5 mg total) by mouth 2 (two) times daily as needed for anxiety.   cyanocobalamin 1000 MCG tablet Commonly known as: VITAMIN B12 Take 1 tablet (1,000 mcg total) by mouth daily.   donepezil 5 MG tablet Commonly known as: ARICEPT TAKE ONE TABLET BY MOUTH AT BEDTIME   FLUoxetine 40  MG capsule Commonly known as: PROZAC TAKE ONE CAPSULE BY MOUTH DAILY   fluticasone 50 MCG/ACT nasal spray Commonly known as: FLONASE USE TWO SPRAYS in each nostril ONCE DAILY.   Flutter Devi Use as directed   folic acid 1 MG tablet Commonly known as: FOLVITE TAKE ONE TABLET BY MOUTH DAILY   ipratropium 0.02 % nebulizer solution Commonly known as: ATROVENT USE ONE vial via nebulization THREE TIMES DAILY   levalbuterol 0.63 MG/3ML nebulizer solution Commonly known as: XOPENEX USE ONE vial via nebulization every SIX hours as needed FOR SHORTNESS OF BREATH OR wheezing.   melatonin 5 MG Tabs Take 5 mg by mouth at bedtime.   memantine 10 MG tablet Commonly known as: NAMENDA TAKE ONE TABLET BY MOUTH TWICE DAILY   mirtazapine 15 MG  tablet Commonly known as: REMERON Take 1 tablet (15 mg total) by mouth at bedtime.   montelukast 10 MG tablet Commonly known as: SINGULAIR TAKE ONE TABLET BY MOUTH DAILY   multivitamin with minerals Tabs tablet Take 1 tablet by mouth daily.   omeprazole 20 MG tablet Commonly known as: PriLOSEC OTC Take 2 tablets (40 mg total) by mouth daily.   QUEtiapine 25 MG tablet Commonly known as: SEROQUEL TAKE ONE TABLET BY MOUTH AT BEDTIME   rosuvastatin 20 MG tablet Commonly known as: CRESTOR TAKE ONE TABLET BY MOUTH DAILY   Thera-D 2000 50 MCG (2000 UT) Tabs Generic drug: Cholecalciferol 1 tab by mouth once daily   thiamine 100 MG tablet Commonly known as: VITAMIN B1 Take 1 tablet (100 mg total) by mouth daily.   Trelegy Ellipta 200-62.5-25 MCG/ACT Aepb Generic drug: Fluticasone-Umeclidin-Vilant INHALE 1 PUFF INTO THE LUNGS ONCE DAILY AS DIRECTED.        Review of Systems  Constitutional:  Negative for appetite change, chills, fatigue, fever and unexpected weight change.  HENT:  Negative for congestion, ear discharge, ear pain, hearing loss, nosebleeds, postnasal drip, rhinorrhea, sinus pressure, sinus pain, sneezing, sore throat, tinnitus and trouble swallowing.   Eyes:  Negative for pain, discharge, redness, itching and visual disturbance.  Respiratory:  Negative for cough, chest tightness, shortness of breath and wheezing.   Cardiovascular:  Negative for chest pain, palpitations and leg swelling.  Gastrointestinal:  Negative for abdominal distention, abdominal pain, blood in stool, constipation, diarrhea, nausea and vomiting.  Endocrine: Negative for cold intolerance, heat intolerance, polydipsia, polyphagia and polyuria.  Genitourinary:  Negative for difficulty urinating, dysuria, flank pain, frequency and urgency.  Musculoskeletal:  Positive for gait problem. Negative for arthralgias, back pain, joint swelling, myalgias, neck pain and neck stiffness.  Skin:  Negative for  color change, pallor, rash and wound.  Neurological:  Negative for dizziness, syncope, speech difficulty, weakness, light-headedness, numbness and headaches.  Hematological:  Does not bruise/bleed easily.  Psychiatric/Behavioral:  Negative for agitation, behavioral problems, confusion, hallucinations and sleep disturbance. The patient is not nervous/anxious.     Immunization History  Administered Date(s) Administered   Fluad Quad(high Dose 65+) 04/15/2019, 04/05/2020, 08/16/2021   Influenza Split 04/11/2011   Influenza Whole 03/31/2008   Influenza, High Dose Seasonal PF 04/29/2013, 03/18/2018   Influenza,inj,Quad PF,6+ Mos 05/07/2014   Influenza-Unspecified 05/13/2015, 04/19/2017   PFIZER(Purple Top)SARS-COV-2 Vaccination 08/01/2019, 08/23/2019, 04/25/2020   Pneumococcal Conjugate-13 05/13/2013   Pneumococcal Polysaccharide-23 03/31/2008, 08/19/2017   Td 02/10/2008   Tdap 12/18/2015, 09/22/2020, 02/26/2021   Zoster Recombinat (Shingrix) 01/25/2017   Zoster, Live 07/09/2005, 01/24/2006   Pertinent  Health Maintenance Due  Topic Date Due   INFLUENZA VACCINE  02/06/2022   DEXA SCAN  Completed   MAMMOGRAM  Discontinued   COLONOSCOPY (Pts 45-60yr Insurance coverage will need to be confirmed)  Discontinued      04/11/2021   12:00 PM 04/17/2021    3:46 PM 06/07/2021    3:49 PM 08/15/2021    4:50 PM 03/05/2022    2:03 PM  Fall Risk  Falls in the past year?  0 0 0 1  Was there an injury with Fall?  0 0 0 1  Fall Risk Category Calculator  0 0 0 2  Fall Risk Category  Low Low Low Moderate  Patient Fall Risk Level High fall risk Low fall risk Low fall risk Low fall risk Moderate fall risk  Patient at Risk for Falls Due to  No Fall Risks No Fall Risks No Fall Risks History of fall(s);Impaired balance/gait  Fall risk Follow up  Falls evaluation completed Falls evaluation completed Falls evaluation completed Falls evaluation completed;Education provided;Falls prevention discussed    Functional Status Survey:    Vitals:   03/05/22 1353  BP: 120/80  Pulse: 98  Resp: 16  Temp: (!) 97.5 F (36.4 C)  SpO2: 93%  Weight: 119 lb 9.6 oz (54.3 kg)  Height: _0  (1.575 m)   Body mass index is 21.88 kg/m. Physical Exam Vitals reviewed.  Constitutional:      General: She is not in acute distress.    Appearance: Normal appearance. She is normal weight. She is not ill-appearing or diaphoretic.  HENT:     Head: Normocephalic.     Right Ear: Tympanic membrane, ear canal and external ear normal. There is no impacted cerumen.     Left Ear: Tympanic membrane, ear canal and external ear normal. There is no impacted cerumen.     Nose: Nose normal. No congestion or rhinorrhea.     Mouth/Throat:     Mouth: Mucous membranes are moist.     Pharynx: Oropharynx is clear. No oropharyngeal exudate or posterior oropharyngeal erythema.  Eyes:     General: No scleral icterus.       Right eye: No discharge.        Left eye: No discharge.     Extraocular Movements: Extraocular movements intact.     Conjunctiva/sclera: Conjunctivae normal.     Pupils: Pupils are equal, round, and reactive to light.  Neck:     Vascular: No carotid bruit.  Cardiovascular:     Rate and Rhythm: Normal rate and regular rhythm.     Pulses: Normal pulses.     Heart sounds: Normal heart sounds. No murmur heard.    No friction rub. No gallop.  Pulmonary:     Effort: Pulmonary effort is normal. No respiratory distress.     Breath sounds: Normal breath sounds. No wheezing, rhonchi or rales.  Chest:     Chest wall: No tenderness.  Abdominal:     General: Bowel sounds are normal. There is no distension.     Palpations: Abdomen is soft. There is no mass.     Tenderness: There is no abdominal tenderness. There is no right CVA tenderness, left CVA tenderness, guarding or rebound.  Musculoskeletal:        General: No swelling or tenderness.     Right shoulder: No swelling, effusion, tenderness or  crepitus. Decreased range of motion. Normal strength. Normal pulse.     Left shoulder: Normal.     Cervical back: Normal range of motion. No rigidity or tenderness.  Right lower leg: No edema.     Left lower leg: No edema.  Lymphadenopathy:     Cervical: No cervical adenopathy.  Skin:    General: Skin is warm and dry.     Coloration: Skin is not pale.     Findings: No bruising, erythema, lesion or rash.  Neurological:     Mental Status: She is alert and oriented to person, place, and time.     Cranial Nerves: No cranial nerve deficit.     Sensory: No sensory deficit.     Motor: No weakness.     Coordination: Coordination normal.     Gait: Gait abnormal.  Psychiatric:        Mood and Affect: Mood normal.        Speech: Speech normal.        Behavior: Behavior normal.        Thought Content: Thought content normal.        Judgment: Judgment normal.     Labs reviewed: Recent Labs    04/07/21 1323 04/08/21 0616 04/09/21 0601 04/10/21 0502 04/17/21 1623 08/16/21 1546 02/20/22 1345  NA 147*   < > 146*   < > 142 141 140  K 3.9   < > 4.4   < > 4.5 4.3 4.8  CL 115*   < > 115*   < > 107 104 103  CO2 26   < > 23   < > _0 GLUCOSE 67*   < > 169*   < > 93 79 80  BUN 14   < > 15   < > _1 CREATININE 0.83   < > 1.10*   < > 0.97 1.08* 1.27*  CALCIUM 8.8*   < > 9.0   < > 8.6 9.4 9.0  MG 1.9  --  2.1  --   --   --   --   PHOS 2.7  --   --   --   --   --   --    < > = values in this interval not displayed.   Recent Labs    04/07/21 1323 08/16/21 1546  AST 29 25  ALT 43 22  ALKPHOS 68  --   BILITOT 0.5 0.4  PROT 6.8 6.3  ALBUMIN 3.1*  --    Recent Labs    04/17/21 1623 08/16/21 1546 02/20/22 1345  WBC 14.7* 8.7 10.3  NEUTROABS 13,833* 6,038 7,818*  HGB 12.1 13.9 13.9  HCT 37.1 42.5 42.0  MCV 93.9 92.0 91.3  PLT 431* 318 297   Lab Results  Component Value Date   TSH 0.86 08/16/2021   Lab Results  Component Value Date   HGBA1C 4.9 04/05/2020    Lab Results  Component Value Date   CHOL 173 02/20/2022   HDL 67 02/20/2022   LDLCALC 81 02/20/2022   LDLDIRECT 124.8 09/21/2011   TRIG 146 02/20/2022   CHOLHDL 2.6 02/20/2022    Significant Diagnostic Results in last 30 days:  No results found.  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)    No new behavioral issues reported -Continue with supportive care -Continue on Seroquel, Remeron, donepezil, clonazepam and bupropion         Other   Vitamin B12 deficiency    Good appetite reported Continue on vitamin B12 supplement      Protein-calorie malnutrition, severe    -Weight stable -Continue on protein supplement and Mirtazapine       Hyperlipidemia    Latest LDL at goal Continue on rosuvastatin      Relevant Orders   Lipid panel   Depression with anxiety    Mood stable -Continue on Wellbutrin and fluoxetine        Family/ staff Communication: Reviewed plan of care with patient and daughter verbalized understanding  Labs/tests ordered:  - CBC with Differential/Platelet - CMP with eGFR([facility]) - Hgb A1C - Lipid panel  Next Appointment : Return in about 6 months (around 09/05/2022) for medical mangement of chronic issues., Fasting labs in 6 months prior to visit.   Sandrea Hughs, NP

## 2022-03-15 DIAGNOSIS — F1027 Alcohol dependence with alcohol-induced persisting dementia: Secondary | ICD-10-CM | POA: Insufficient documentation

## 2022-03-15 DIAGNOSIS — E538 Deficiency of other specified B group vitamins: Secondary | ICD-10-CM | POA: Insufficient documentation

## 2022-03-15 NOTE — Assessment & Plan Note (Signed)
Good appetite reported Continue on vitamin B12 supplement

## 2022-03-15 NOTE — Assessment & Plan Note (Signed)
asymptomatic -Continue to control high risk factors -Continue on rosuvastatin

## 2022-03-15 NOTE — Assessment & Plan Note (Signed)
Latest LDL at goal Continue on rosuvastatin

## 2022-03-15 NOTE — Assessment & Plan Note (Signed)
No new behavioral issues reported -Continue with supportive care -Continue on Seroquel, Remeron, donepezil, clonazepam and bupropion

## 2022-03-15 NOTE — Assessment & Plan Note (Signed)
-  Weight stable -Continue on protein supplement and Mirtazapine

## 2022-03-15 NOTE — Assessment & Plan Note (Signed)
Blood pressure well controlled Continue on amlodipine

## 2022-03-15 NOTE — Assessment & Plan Note (Addendum)
Mood stable -Continue on Wellbutrin and fluoxetine

## 2022-03-15 NOTE — Assessment & Plan Note (Signed)
Breathing stable -Continue on montelukast, trilegy Ellipta, ipratropium and levalbuterol

## 2022-04-05 NOTE — Telephone Encounter (Signed)
Mychart message sent by pt: [facility] (supporting Magdalen Spatz, NP) Yesterday (3:17 PM)    My mother's nebulizer has started malfunctioning and you have to sort of bang on it to get it to work.  I called [facility] in [facility] and they said that I needed to request a prescription from her Pulmonologist to be sent to them for a new unit.    Sarah, please advise if you are okay with [place] sending Rx for pt to receive a new neb machine.

## 2022-04-06 ENCOUNTER — Other Ambulatory Visit: Payer: Self-pay

## 2022-04-06 ENCOUNTER — Telehealth: Payer: Self-pay | Admitting: Family

## 2022-04-06 DIAGNOSIS — J449 Chronic obstructive pulmonary disease, unspecified: Secondary | ICD-10-CM

## 2022-04-06 NOTE — Telephone Encounter (Signed)
Appointment scheduled with patient's son for 04/12/2022 with Dinah.   Paperwork placed in [employer] under "5"

## 2022-04-06 NOTE — Telephone Encounter (Signed)
Fax received from [facility] [place] request order for renewal of oxygen use and qualifying diagnosis.Also need recent oxygen testing.Please schedule appointment to check oxygen at rest and on exertion. Unless patient has pulse ox to check oxygen level at rest and on exertion with and without oxygen then send results.

## 2022-04-12 ENCOUNTER — Encounter: Payer: Medicare HMO | Admitting: Family

## 2022-04-15 NOTE — Progress Notes (Signed)
  This encounter was created in error - please disregard. No show 

## 2022-04-16 ENCOUNTER — Other Ambulatory Visit: Payer: Self-pay | Admitting: Family

## 2022-04-17 ENCOUNTER — Ambulatory Visit: Payer: Medicare HMO | Admitting: Family

## 2022-05-01 ENCOUNTER — Other Ambulatory Visit: Payer: Self-pay | Admitting: Family

## 2022-05-01 DIAGNOSIS — F0390 Unspecified dementia without behavioral disturbance: Secondary | ICD-10-CM

## 2022-07-04 ENCOUNTER — Ambulatory Visit (HOSPITAL_COMMUNITY)
Admission: RE | Admit: 2022-07-04 | Discharge: 2022-07-04 | Disposition: A | Discharge status: Home / Self Care | Payer: Medicare HMO | Source: Ambulatory Visit | Attending: Family | Admitting: Family

## 2022-07-04 DIAGNOSIS — F17200 Nicotine dependence, unspecified, uncomplicated: Secondary | ICD-10-CM | POA: Insufficient documentation

## 2022-07-04 DIAGNOSIS — R918 Other nonspecific abnormal finding of lung field: Secondary | ICD-10-CM | POA: Diagnosis not present

## 2022-07-04 DIAGNOSIS — I700 Atherosclerosis of aorta: Secondary | ICD-10-CM | POA: Diagnosis not present

## 2022-07-04 DIAGNOSIS — J439 Emphysema, unspecified: Secondary | ICD-10-CM | POA: Diagnosis not present

## 2022-07-04 DIAGNOSIS — Z87891 Personal history of nicotine dependence: Secondary | ICD-10-CM | POA: Diagnosis not present

## 2022-07-30 ENCOUNTER — Other Ambulatory Visit: Payer: Self-pay | Admitting: Family

## 2022-08-01 ENCOUNTER — Other Ambulatory Visit: Payer: Self-pay

## 2022-08-01 DIAGNOSIS — I10 Essential (primary) hypertension: Secondary | ICD-10-CM

## 2022-08-01 DIAGNOSIS — E7800 Pure hypercholesterolemia, unspecified: Secondary | ICD-10-CM

## 2022-08-10 ENCOUNTER — Other Ambulatory Visit: Payer: Self-pay | Admitting: Family

## 2022-08-30 ENCOUNTER — Other Ambulatory Visit: Payer: Self-pay | Admitting: Family

## 2022-08-30 DIAGNOSIS — J449 Chronic obstructive pulmonary disease, unspecified: Secondary | ICD-10-CM

## 2022-09-03 ENCOUNTER — Other Ambulatory Visit: Payer: Medicare HMO

## 2022-09-03 DIAGNOSIS — I10 Essential (primary) hypertension: Secondary | ICD-10-CM | POA: Diagnosis not present

## 2022-09-03 DIAGNOSIS — E7800 Pure hypercholesterolemia, unspecified: Secondary | ICD-10-CM | POA: Diagnosis not present

## 2022-09-04 LAB — COMPLETE METABOLIC PANEL WITH GFR
AG Ratio: 1.3 (calc) (ref 1.0–2.5)
ALT: 21 U/L (ref 6–29)
AST: 26 U/L (ref 10–35)
Albumin: 3.7 g/dL (ref 3.6–5.1)
Alkaline phosphatase (APISO): 85 U/L (ref 37–153)
BUN/Creatinine Ratio: 13 (calc) (ref 6–22)
BUN: 17 mg/dL (ref 7–25)
CO2: 30 mmol/L (ref 20–32)
Calcium: 9.1 mg/dL (ref 8.6–10.4)
Chloride: 103 mmol/L (ref 98–110)
Creat: 1.26 mg/dL — ABNORMAL HIGH (ref 0.60–1.00)
Globulin: 2.9 g/dL (calc) (ref 1.9–3.7)
Glucose, Bld: 80 mg/dL (ref 65–99)
Potassium: 4.5 mmol/L (ref 3.5–5.3)
Sodium: 141 mmol/L (ref 135–146)
Total Bilirubin: 0.5 mg/dL (ref 0.2–1.2)
Total Protein: 6.6 g/dL (ref 6.1–8.1)
eGFR: 43 mL/min/{1.73_m2} — ABNORMAL LOW (ref >=60)

## 2022-09-04 LAB — CBC WITH DIFFERENTIAL/PLATELET
Absolute Monocytes: 715 cells/uL (ref 200–950)
Basophils Absolute: 59 cells/uL (ref 0–200)
Basophils Relative: 0.6 %
Eosinophils Absolute: 216 cells/uL (ref 15–500)
Eosinophils Relative: 2.2 %
HCT: 42.6 % (ref 35.0–45.0)
Hemoglobin: 14 g/dL (ref 11.7–15.5)
Lymphs Abs: 1333 cells/uL (ref 850–3900)
MCH: 29.9 pg (ref 27.0–33.0)
MCHC: 32.9 g/dL (ref 32.0–36.0)
MCV: 90.8 fL (ref 80.0–100.0)
MPV: 10.5 fL (ref 7.5–12.5)
Monocytes Relative: 7.3 %
Neutro Abs: 7477 cells/uL (ref 1500–7800)
Neutrophils Relative %: 76.3 %
Platelets: 296 10*3/uL (ref 140–400)
RBC: 4.69 10*6/uL (ref 3.80–5.10)
RDW: 12.4 % (ref 11.0–15.0)
Total Lymphocyte: 13.6 %
WBC: 9.8 10*3/uL (ref 3.8–10.8)

## 2022-09-04 LAB — LIPID PANEL
Cholesterol: 166 mg/dL (ref <200)
HDL: 58 mg/dL (ref >=50)
LDL Cholesterol (Calc): 80 mg/dL (calc)
Non-HDL Cholesterol (Calc): 108 mg/dL (calc) (ref <130)
Total CHOL/HDL Ratio: 2.9 (calc) (ref <5.0)
Triglycerides: 185 mg/dL — ABNORMAL HIGH (ref <150)

## 2022-09-06 ENCOUNTER — Telehealth: Payer: Self-pay | Admitting: *Deleted

## 2022-09-06 NOTE — Telephone Encounter (Signed)
MyChart Appointment scheduled with Son for tomorrow with Webb Silversmith.

## 2022-09-06 NOTE — Telephone Encounter (Signed)
Son, Gwyndolyn Saxon, called and left message on clinical intake and stated that they tested patient today for Covid and it came back Positive.   Tried calling patient's son back to schedule a MyChart Visit and LMOM for patient son to return my call.

## 2022-09-07 ENCOUNTER — Telehealth (INDEPENDENT_AMBULATORY_CARE_PROVIDER_SITE_OTHER): Payer: Medicare HMO | Admitting: Family

## 2022-09-07 ENCOUNTER — Other Ambulatory Visit: Payer: Self-pay

## 2022-09-07 DIAGNOSIS — U071 COVID-19: Secondary | ICD-10-CM | POA: Diagnosis not present

## 2022-09-07 DIAGNOSIS — J069 Acute upper respiratory infection, unspecified: Secondary | ICD-10-CM

## 2022-09-07 MED ORDER — NIRMATRELVIR/RITONAVIR (PAXLOVID)TABLET: 3.0000 | ORAL_TABLET | Freq: Two times a day (BID) | ORAL | 0 refills | Status: DC

## 2022-09-07 MED ORDER — NIRMATRELVIR/RITONAVIR (PAXLOVID)TABLET: 3.0000 | ORAL_TABLET | Freq: Two times a day (BID) | ORAL | 0 refills | Status: AC

## 2022-09-07 NOTE — Progress Notes (Signed)
Provider: Marlowe Sax FNP-C  Ariadna Setter, Nelda Bucks, NP  Patient Care Team: Lily Velasquez, Nelda Bucks, NP as PCP - General (Family Medicine) Skeet Latch, MD as PCP - Cardiology (Cardiology)  Extended Emergency Contact Information Primary Emergency Contact: [facility] Address: [address]          [place], [place] [address] Johnnette Litter of [place] Work Phone: [phone] Mobile Phone: [phone] Relation: Son Secondary Emergency Contact: Thedore Mins [place] of [place] Phone: [phone] Relation: Brother  Code Status:  DNR Goals of care: Advanced Directive information    03/05/2022    2:04 PM  Advanced Directives  Does Patient Have a Medical Advance Directive? Yes  Type of Paramedic of [place];Living will  Does patient want to make changes to medical advance directive? No - Patient declined  Copy of [place] in Chart? No - copy requested     Chief Complaint  Patient presents with   Acute Visit    Tested positive for COVID-19 yesterday 09/06/2022     HPI:  Pt is a 77 y.o. female seen today for an acute visit for evaluation of COVID-19 positive test 09/06/2022.HPI information obtained with assistance from patient's son present during the visit.Patient observed sitting up in the bed.she was retested today and was still positive.Has had cough and runny nose.Also states has no appetite. One of the care givers tested positive for COVID-19 had been around the patient.   She denies any fever,chills,cough,fatigue,body aches,chest tightness,chest pain,palpitation or shortness of breath.Also denies any diarrhea.    Past Medical History:  Diagnosis Date   Allergic rhinitis    Allergic rhinitis, cause unspecified 09/21/2011   Aortic stenosis 08/23/2016   Arthritis    Asthma    Asthma 09/21/2011   Colon polyps    COPD (chronic obstructive pulmonary disease) (HCC)    mild    Coronary artery calcification  08/23/2016   Dementia (HCC)    Depression with anxiety    HTN (hypertension)    Hyperlipidemia    UTI (lower urinary tract infection)    Past Surgical History:  Procedure Laterality Date   DENTAL SURGERY     pilonidial cyst     TONSILLECTOMY      Allergies  Allergen Reactions   Lipitor [Atorvastatin] Other (See Comments)    Myalgias    Outpatient Encounter Medications as of 09/07/2022  Medication Sig   nirmatrelvir/ritonavir (PAXLOVID) 20 x 150 MG & 10 x '100MG'$  TABS Take 3 tablets by mouth 2 (two) times daily for 5 days. (Take nirmatrelvir 150 mg two tablets twice daily for 5 days and ritonavir 100 mg one tablet twice daily for 5 days) Patient GFR is 43   acetaminophen (TYLENOL) 325 MG tablet Take 650 mg by mouth every 6 (six) hours as needed for mild pain or headache.   amLODipine (NORVASC) 5 MG tablet TAKE ONE TABLET BY MOUTH DAILY   buPROPion (WELLBUTRIN XL) 150 MG 24 hr tablet TAKE ONE TABLET BY MOUTH DAILY   Cholecalciferol (THERA-D 2000) 50 MCG (2000 UT) TABS 1 tab by mouth once daily   clonazePAM (KLONOPIN) 0.5 MG tablet Take 1 tablet (0.5 mg total) by mouth 2 (two) times daily as needed for anxiety.   donepezil (ARICEPT) 5 MG tablet TAKE ONE TABLET BY MOUTH AT BEDTIME   FLUoxetine (PROZAC) 40 MG capsule TAKE ONE CAPSULE BY MOUTH DAILY   fluticasone (FLONASE) 50 MCG/ACT nasal spray USE TWO SPRAYS in each nostril ONCE DAILY.   folic  acid (FOLVITE) 1 MG tablet TAKE ONE TABLET BY MOUTH DAILY   ipratropium (ATROVENT) 0.02 % nebulizer solution NEBULIZE CONTENTS OF 1 VIAL 3 TIMES A DAY   levalbuterol (XOPENEX) 0.63 MG/3ML nebulizer solution USE ONE vial via nebulization every SIX hours as needed FOR SHORTNESS OF BREATH OR wheezing.   melatonin 5 MG TABS Take 5 mg by mouth at bedtime.   memantine (NAMENDA) 10 MG tablet TAKE ONE TABLET BY MOUTH TWICE DAILY   mirtazapine (REMERON) 15 MG tablet TAKE ONE TABLET BY MOUTH AT BEDTIME   montelukast (SINGULAIR) 10 MG tablet TAKE ONE TABLET BY  MOUTH DAILY   Multiple Vitamin (MULTIVITAMIN WITH MINERALS) TABS tablet Take 1 tablet by mouth daily.   omeprazole (PRILOSEC OTC) 20 MG tablet Take 2 tablets (40 mg total) by mouth daily.   omeprazole (PRILOSEC) 20 MG capsule TAKE TWO CAPSULES BY MOUTH EVERY DAY   QUEtiapine (SEROQUEL) 25 MG tablet TAKE ONE TABLET BY MOUTH AT BEDTIME   Respiratory Therapy Supplies (FLUTTER) DEVI Use as directed   rosuvastatin (CRESTOR) 20 MG tablet TAKE ONE TABLET BY MOUTH DAILY   thiamine 100 MG tablet Take 1 tablet (100 mg total) by mouth daily.   TRELEGY ELLIPTA 200-62.5-25 MCG/ACT AEPB INHALE 1 PUFF INTO THE LUNGS ONCE DAILY AS DIRECTED.   vitamin B-12 (CYANOCOBALAMIN) 1000 MCG tablet Take 1 tablet (1,000 mcg total) by mouth daily.   No facility-administered encounter medications on file as of 09/07/2022.    Review of Systems  Constitutional:  Positive for appetite change. Negative for chills, fatigue, fever and unexpected weight change.  HENT:  Positive for rhinorrhea. Negative for congestion, dental problem, ear discharge, ear pain, facial swelling, hearing loss, nosebleeds, postnasal drip, sinus pressure, sinus pain, sneezing, sore throat, tinnitus and trouble swallowing.   Eyes:  Negative for pain, discharge, redness, itching and visual disturbance.  Respiratory:  Positive for cough. Negative for chest tightness, shortness of breath and wheezing.   Cardiovascular:  Negative for chest pain, palpitations and leg swelling.  Gastrointestinal:  Negative for abdominal distention, abdominal pain, blood in stool, constipation, diarrhea, nausea and vomiting.  Genitourinary:  Negative for difficulty urinating, dysuria, flank pain, frequency and urgency.  Musculoskeletal:  Positive for gait problem. Negative for joint swelling, myalgias, neck pain and neck stiffness.  Skin:  Negative for color change, pallor, rash and wound.  Neurological:  Negative for dizziness, weakness, light-headedness and headaches.     Immunization History  Administered Date(s) Administered   Fluad Quad(high Dose 65+) 04/15/2019, 04/05/2020, 08/16/2021   Influenza Split 04/11/2011   Influenza Whole 03/31/2008   Influenza, High Dose Seasonal PF 04/29/2013, 03/18/2018   Influenza,inj,Quad PF,6+ Mos 05/07/2014   Influenza-Unspecified 05/13/2015, 04/19/2017   PFIZER(Purple Top)SARS-COV-2 Vaccination 08/01/2019, 08/23/2019, 04/25/2020   Pneumococcal Conjugate-13 05/13/2013   Pneumococcal Polysaccharide-23 03/31/2008, 08/19/2017   Td 02/10/2008   Tdap 12/18/2015, 09/22/2020, 02/26/2021   Zoster Recombinat (Shingrix) 01/25/2017   Zoster, Live 07/09/2005, 01/24/2006   Pertinent  Health Maintenance Due  Topic Date Due   INFLUENZA VACCINE  02/06/2022   DEXA SCAN  Completed   MAMMOGRAM  Discontinued   COLONOSCOPY (Pts 45-38yr Insurance coverage will need to be confirmed)  Discontinued      04/11/2021   12:00 PM 04/17/2021    3:46 PM 06/07/2021    3:49 PM 08/15/2021    4:50 PM 03/05/2022    2:03 PM  FBrightonin the past year?  0 0 0 1  Was there an injury with Fall?  0 0 0 1  Fall Risk Category Calculator  0 0 0 2  Fall Risk Category (Retired)  Low Low Low Moderate  (RETIRED) Patient Fall Risk Level High fall risk Low fall risk Low fall risk Low fall risk Moderate fall risk  Patient at Risk for Falls Due to  No Fall Risks No Fall Risks No Fall Risks History of fall(s);Impaired balance/gait  Fall risk Follow up  Falls evaluation completed Falls evaluation completed Falls evaluation completed Falls evaluation completed;Education provided;Falls prevention discussed   Functional Status Survey:    There were no vitals filed for this visit. There is no height or weight on file to calculate BMI. Physical Exam Constitutional:      General: She is not in acute distress.    Appearance: She is not ill-appearing.  Pulmonary:     Effort: Pulmonary effort is normal. No respiratory distress.  Neurological:      Mental Status: She is alert. Mental status is at baseline.  Psychiatric:        Mood and Affect: Mood normal.        Behavior: Behavior normal.    Labs reviewed: Recent Labs    02/20/22 1345 09/03/22 0000  NA 140 141  K 4.8 4.5  CL 103 103  CO2 27 30  GLUCOSE 80 80  BUN 17 17  CREATININE 1.27* 1.26*  CALCIUM 9.0 9.1   Recent Labs    09/03/22 0000  AST 26  ALT 21  BILITOT 0.5  PROT 6.6   Recent Labs    02/20/22 1345 09/03/22 0000  WBC 10.3 9.8  NEUTROABS 7,818* 7,477  HGB 13.9 14.0  HCT 42.0 42.6  MCV 91.3 90.8  PLT 297 296   Lab Results  Component Value Date   TSH 0.86 08/16/2021   Lab Results  Component Value Date   HGBA1C 4.9 04/05/2020   Lab Results  Component Value Date   CHOL 166 09/03/2022   HDL 58 09/03/2022   LDLCALC 80 09/03/2022   LDLDIRECT 124.8 09/21/2011   TRIG 185 (H) 09/03/2022   CHOLHDL 2.9 09/03/2022    Significant Diagnostic Results in last 30 days:  No results found.  Assessment/Plan 1. Positive self-administered antigen test for COVID-19 Tested positive for COVID-19 yesterday and today again after her Care giver tested positive. - continue with Mucinex for cough  - start on Paxlovid as below - Notify provider or go to ED if symptoms worsen or fail to improve - nirmatrelvir/ritonavir (PAXLOVID) 20 x 150 MG & 10 x '100MG'$  TABS; Take 3 tablets by mouth 2 (two) times daily for 5 days. (Take nirmatrelvir 150 mg two tablets twice daily for 5 days and ritonavir 100 mg one tablet twice daily for 5 days) Patient GFR is 43  Dispense: 30 tablet; Refill: 0 - Advised to hold Crestor and Flonase for 5 days while taking paxlovid then restart after completing paxlovid.   2. Upper respiratory tract infection due to COVID-19 virus Cough,runny nose,congestion due to COVID-19  - start on Paxlovid  - nirmatrelvir/ritonavir (PAXLOVID) 20 x 150 MG & 10 x '100MG'$  TABS; Take 3 tablets by mouth 2 (two) times daily for 5 days. (Take nirmatrelvir 150 mg two  tablets twice daily for 5 days and ritonavir 100 mg one tablet twice daily for 5 days) Patient GFR is 43  Dispense: 30 tablet; Refill: 0  Family/ staff Communication: Reviewed plan of care with patient and son verbalized understanding   Labs/tests ordered: None   Next Appointment:  I connected with  [name] on 09/07/22 by a video enabled telemedicine application and verified that I am speaking with the correct person using two identifiers.   I discussed the limitations of evaluation and management by telemedicine. The patient expressed understanding and agreed to proceed.  Spent 12 minutes of face to face with patient  >50% time spent counseling; reviewing medical record; labs; and developing future plan of care.   Sandrea Hughs, NP

## 2022-09-07 NOTE — Patient Instructions (Addendum)
-   Hold Rosuvastatin ( Crestor ) 20 mg tablet for 5 days while taking Paxlovid then restart after completing paxlovid.  - Also Hold Flonase nasal spray for 5 days while on Paxlovid then restart after completing.

## 2022-09-07 NOTE — Telephone Encounter (Signed)
Patient's son called and would like medication sent to [place] because [facility] doesn't carry Paxlovid. High warning came up when trying to send to pharmacy.  Medication pended and sent to Marlowe Sax, NP

## 2022-09-13 ENCOUNTER — Other Ambulatory Visit: Payer: Self-pay | Admitting: Family

## 2022-09-13 ENCOUNTER — Telehealth: Payer: Self-pay

## 2022-09-13 DIAGNOSIS — Z111 Encounter for screening for respiratory tuberculosis: Secondary | ICD-10-CM

## 2022-09-13 NOTE — Telephone Encounter (Signed)
FL 2 form completed.spoke with patient's Son Mr.Andrea Larson to schedule lab appointment for TB test.QuantiFeron gold test ordered.Forms to be pickup or faxed with results of QuantiFeron.

## 2022-09-13 NOTE — Telephone Encounter (Addendum)
Patient's daughter-in-law dropped off FL2 form for patient yesterday to be completed and signed. She called back this morning stating that the facility they are placing patient in needs from by today in order to move patient in Monday. I told her that someone would call patient's son once form is ready to pick up. There is a lot of information that needs to be place on the form.  Message routed to Marlowe Sax, NP

## 2022-09-13 NOTE — Progress Notes (Signed)
Quantiferon Gold test ordered spoke with patient's son to schedule appointment for lab to be drawn. FL 2 Forms completed just awaiting TB screening.

## 2022-09-13 NOTE — Telephone Encounter (Signed)
Paperwork placed on desk of Marlowe Sax, NP to review, complete and sign  Message routed to [employer], NP

## 2022-09-14 ENCOUNTER — Other Ambulatory Visit: Payer: Medicare HMO

## 2022-09-14 DIAGNOSIS — Z111 Encounter for screening for respiratory tuberculosis: Secondary | ICD-10-CM

## 2022-09-18 ENCOUNTER — Telehealth: Payer: Self-pay

## 2022-09-18 LAB — QUANTIFERON-TB GOLD PLUS
Mitogen-NIL: 1.22 IU/mL
NIL: 0.04 IU/mL
QuantiFERON-TB Gold Plus: NEGATIVE
TB1-NIL: <0 IU/mL
TB2-NIL: <0 IU/mL

## 2022-09-18 NOTE — Telephone Encounter (Signed)
Lab results are in please review so that I can complete form and send copy with FL2 form and inform family form is ready to be picked up.  Message routed to Marlowe Sax, NP

## 2022-09-18 NOTE — Telephone Encounter (Signed)
Patient's son Gwyndolyn Saxon called and informed form ready for pick up.

## 2022-09-18 NOTE — Telephone Encounter (Signed)
error 

## 2022-09-18 NOTE — Telephone Encounter (Signed)
Lab results reviewed.May send form F L2 with results  as requested.

## 2022-09-21 ENCOUNTER — Telehealth: Payer: Self-pay | Admitting: Nurse Practitioner

## 2022-09-21 ENCOUNTER — Other Ambulatory Visit: Payer: Self-pay | Admitting: *Deleted

## 2022-09-21 DIAGNOSIS — J449 Chronic obstructive pulmonary disease, unspecified: Secondary | ICD-10-CM

## 2022-09-21 NOTE — Telephone Encounter (Signed)
Order placed. Nothing further needed. 

## 2022-09-21 NOTE — Telephone Encounter (Signed)
09/20/2022 1722: Pt's son, Gwyndolyn Saxon, contacted after hours answering service. Pt was moved into assisted living and they need a new O2 order so she can keep her home concentrator in the room. Attempted to contact the facility twice without any answer. Will have [facility] fax over an order today. She is utilizing 1.5-2 lpm as needed with activity to maintain oxygen levels >88-90%. No increased needs. No acute symptoms.

## 2022-09-27 ENCOUNTER — Telehealth: Payer: Self-pay | Admitting: Nurse Practitioner

## 2022-09-27 NOTE — Telephone Encounter (Signed)
Called and left message for Bookie to call me back in regards to oxygen order.

## 2022-09-27 NOTE — Telephone Encounter (Signed)
Bookie called me back when I was upfront and we got oxygen order taken care of. Nothing further needed

## 2022-10-17 ENCOUNTER — Telehealth: Payer: Medicare HMO

## 2022-10-17 DIAGNOSIS — F1027 Alcohol dependence with alcohol-induced persisting dementia: Secondary | ICD-10-CM

## 2022-10-17 NOTE — Telephone Encounter (Signed)
Please schedule for follow appointment to sign clonazepam use contract.

## 2022-10-17 NOTE — Telephone Encounter (Signed)
Incoming fax received from [place] at [facility] saying see attached from, fill out and fax back to [phone]. Form printed and placed in review and sign folder. Request is for a refill to [facility] pharmacy for clonazepam.   This patient does not have a treatment agreement on file nor does she have a pending appointment

## 2022-10-17 NOTE — Telephone Encounter (Signed)
Mychart message was sent to patient with Dinahs response.

## 2022-10-24 MED ORDER — CLONAZEPAM 0.5 MG PO TABS: 0.5000 mg | ORAL_TABLET | Freq: Two times a day (BID) | ORAL | 0 refills | Status: DC | PRN

## 2022-10-24 NOTE — Telephone Encounter (Signed)
Fax received again today requesting a refill for clonazepam from [facility]. RX was marked as historical on 10/17/22 and not sent to pharmacy.  Patient has a pending appointment on 10/31/22

## 2022-10-24 NOTE — Addendum Note (Signed)
Addended byRicharda Blade C on: 10/24/2022 12:58 PM   Modules accepted: Orders

## 2022-10-24 NOTE — Addendum Note (Signed)
Addended by: Maurice Small on: 10/24/2022 12:35 PM   Modules accepted: Orders

## 2022-10-29 ENCOUNTER — Other Ambulatory Visit: Payer: Self-pay | Admitting: Family

## 2022-10-29 DIAGNOSIS — F0390 Unspecified dementia without behavioral disturbance: Secondary | ICD-10-CM

## 2022-10-31 ENCOUNTER — Ambulatory Visit (INDEPENDENT_AMBULATORY_CARE_PROVIDER_SITE_OTHER): Payer: Medicare HMO | Admitting: Family

## 2022-10-31 VITALS — BP 118/76 | HR 113 | Temp 97.6°F | Resp 14 | Ht 62.0 in | Wt 115.6 lb

## 2022-10-31 DIAGNOSIS — N1831 Chronic kidney disease, stage 3a: Secondary | ICD-10-CM

## 2022-10-31 DIAGNOSIS — J9611 Chronic respiratory failure with hypoxia: Secondary | ICD-10-CM | POA: Diagnosis not present

## 2022-10-31 DIAGNOSIS — E7800 Pure hypercholesterolemia, unspecified: Secondary | ICD-10-CM

## 2022-10-31 DIAGNOSIS — J449 Chronic obstructive pulmonary disease, unspecified: Secondary | ICD-10-CM

## 2022-10-31 DIAGNOSIS — K219 Gastro-esophageal reflux disease without esophagitis: Secondary | ICD-10-CM

## 2022-10-31 DIAGNOSIS — I10 Essential (primary) hypertension: Secondary | ICD-10-CM | POA: Diagnosis not present

## 2022-10-31 DIAGNOSIS — I700 Atherosclerosis of aorta: Secondary | ICD-10-CM

## 2022-10-31 DIAGNOSIS — E43 Unspecified severe protein-calorie malnutrition: Secondary | ICD-10-CM | POA: Diagnosis not present

## 2022-10-31 DIAGNOSIS — F03918 Unspecified dementia, unspecified severity, with other behavioral disturbance: Secondary | ICD-10-CM

## 2022-10-31 MED ORDER — OMEPRAZOLE 20 MG PO CPDR: 40.0000 mg | DELAYED_RELEASE_CAPSULE | Freq: Every day | ORAL | 1 refills | Status: AC | PRN

## 2022-10-31 NOTE — Progress Notes (Signed)
Provider: Richarda Blade FNP-C  Aiva Miskell, Donalee Citrin, NP  Patient Care Team: Zak Gondek, Donalee Citrin, NP as PCP - General (Family Medicine) Chilton Si, MD as PCP - Cardiology (Cardiology)  Extended Emergency Contact Information Primary Emergency Contact: [facility] Address: [address]          [place], [place] [address] [place] of [place] Work Phone: [phone] Mobile Phone: [phone] Relation: Son Secondary Emergency Contact: Priscille Loveless [place] of [place] Home Phone: [phone] Relation: Brother  Code Status:  DNR Goals of care: Advanced Directive information    03/05/2022    2:04 PM  Advanced Directives  Does Patient Have a Medical Advance Directive? Yes  Type of Estate agent of [place];Living will  Does patient want to make changes to medical advance directive? No - Patient declined  Copy of Healthcare Power of Attorney in Chart? No - copy requested     Chief Complaint  Patient presents with   Medication Management    Discuss medications. Doesn't look like [facility] has been giving her Seroquel. Unsure if it was changed out or if she should be on that as well. Looks like she was told to continue 03/2022 nut it was never put on FL2 in March sent to [facility]. O2 was 88% and put on 2 L O2 while in office    HPI:  Pt is a 77 y.o. female seen today for an acute visit for medication.She is here with son Chrissie Noa who provides HPI information.she continues to resides at [facility]. Son states patient's Seroquel was not added to F L 2 while transferring to facility though on chart review Seroquel was discontinued in 2023. He reports no new behaviors such as combativeness, aggressiveness,sundowning or verbal abusiveness.  Oxygen on room air was 88 % left own oxygen at home.son states was doing fine without the oxygen.   Past Medical History:  Diagnosis Date   Allergic rhinitis    Allergic rhinitis, cause  unspecified 09/21/2011   Aortic stenosis 08/23/2016   Arthritis    Asthma    Asthma 09/21/2011   Colon polyps    COPD (chronic obstructive pulmonary disease) (HCC)    mild    Coronary artery calcification 08/23/2016   Dementia (HCC)    Depression with anxiety    HTN (hypertension)    Hyperlipidemia    UTI (lower urinary tract infection)    Past Surgical History:  Procedure Laterality Date   DENTAL SURGERY     pilonidial cyst     TONSILLECTOMY      Allergies  Allergen Reactions   Lipitor [Atorvastatin] Other (See Comments)    Myalgias    Outpatient Encounter Medications as of 10/31/2022  Medication Sig   acetaminophen (TYLENOL) 325 MG tablet Take 650 mg by mouth every 6 (six) hours as needed for mild pain or headache.   amLODipine (NORVASC) 5 MG tablet TAKE ONE TABLET BY MOUTH DAILY   buPROPion (WELLBUTRIN XL) 150 MG 24 hr tablet TAKE ONE TABLET BY MOUTH DAILY   Cholecalciferol (THERA-D 2000) 50 MCG (2000 UT) TABS 1 tab by mouth once daily   clonazePAM (KLONOPIN) 0.5 MG tablet Take 1 tablet (0.5 mg total) by mouth 2 (two) times daily as needed for anxiety.   donepezil (ARICEPT) 5 MG tablet TAKE ONE TABLET BY MOUTH AT BEDTIME   FLUoxetine (PROZAC) 40 MG capsule TAKE ONE CAPSULE BY MOUTH DAILY   fluticasone (FLONASE) 50 MCG/ACT nasal spray USE TWO SPRAYS in each nostril ONCE DAILY.  folic acid (FOLVITE) 1 MG tablet TAKE ONE TABLET BY MOUTH DAILY   ipratropium (ATROVENT) 0.02 % nebulizer solution NEBULIZE CONTENTS OF 1 VIAL 3 TIMES A DAY   levalbuterol (XOPENEX) 0.63 MG/3ML nebulizer solution USE ONE vial via nebulization every SIX hours as needed FOR SHORTNESS OF BREATH OR wheezing.   melatonin 5 MG TABS Take 5 mg by mouth at bedtime.   memantine (NAMENDA) 10 MG tablet TAKE ONE TABLET BY MOUTH TWICE DAILY   mirtazapine (REMERON) 15 MG tablet TAKE ONE TABLET BY MOUTH AT BEDTIME   montelukast (SINGULAIR) 10 MG tablet TAKE ONE TABLET BY MOUTH DAILY   Multiple Vitamin (MULTIVITAMIN  WITH MINERALS) TABS tablet Take 1 tablet by mouth daily.   omeprazole (PRILOSEC) 20 MG capsule TAKE TWO CAPSULES BY MOUTH EVERY DAY   Respiratory Therapy Supplies (FLUTTER) DEVI Use as directed   rosuvastatin (CRESTOR) 20 MG tablet TAKE ONE TABLET BY MOUTH DAILY   thiamine 100 MG tablet Take 1 tablet (100 mg total) by mouth daily.   TRELEGY ELLIPTA 200-62.5-25 MCG/ACT AEPB INHALE 1 PUFF INTO THE LUNGS ONCE DAILY AS DIRECTED.   vitamin B-12 (CYANOCOBALAMIN) 1000 MCG tablet Take 1 tablet (1,000 mcg total) by mouth daily.   QUEtiapine (SEROQUEL) 25 MG tablet TAKE ONE TABLET BY MOUTH AT BEDTIME (Patient not taking: Reported on 10/31/2022)   [DISCONTINUED] omeprazole (PRILOSEC OTC) 20 MG tablet Take 2 tablets (40 mg total) by mouth daily.   No facility-administered encounter medications on file as of 10/31/2022.    Review of Systems  Constitutional:  Negative for appetite change, chills, fatigue, fever and unexpected weight change.  HENT:  Negative for congestion, dental problem, ear discharge, ear pain, facial swelling, hearing loss, nosebleeds, postnasal drip, rhinorrhea, sinus pressure, sinus pain, sneezing, sore throat, tinnitus and trouble swallowing.   Eyes:  Negative for pain, discharge, redness, itching and visual disturbance.  Respiratory:  Negative for cough, chest tightness, shortness of breath and wheezing.   Cardiovascular:  Negative for chest pain, palpitations and leg swelling.  Gastrointestinal:  Negative for abdominal distention, abdominal pain, blood in stool, constipation, diarrhea, nausea and vomiting.  Endocrine: Negative for cold intolerance, heat intolerance, polydipsia, polyphagia and polyuria.  Genitourinary:  Negative for difficulty urinating, dysuria, flank pain, frequency and urgency.  Musculoskeletal:  Negative for arthralgias, back pain, gait problem, joint swelling, myalgias, neck pain and neck stiffness.  Skin:  Negative for color change, pallor, rash and wound.   Neurological:  Negative for dizziness, syncope, speech difficulty, weakness, light-headedness, numbness and headaches.  Hematological:  Does not bruise/bleed easily.  Psychiatric/Behavioral:  Negative for agitation, behavioral problems, confusion, hallucinations, self-injury, sleep disturbance and suicidal ideas. The patient is not nervous/anxious.        Memory loss    Immunization History  Administered Date(s) Administered   Fluad Quad(high Dose 65+) 04/15/2019, 04/05/2020, 08/16/2021   Influenza Split 04/11/2011   Influenza Whole 03/31/2008   Influenza, High Dose Seasonal PF 04/29/2013, 03/18/2018   Influenza,inj,Quad PF,6+ Mos 05/07/2014   Influenza-Unspecified 05/13/2015, 04/19/2017   PFIZER(Purple Top)SARS-COV-2 Vaccination 08/01/2019, 08/23/2019, 04/25/2020   Pneumococcal Conjugate-13 05/13/2013   Pneumococcal Polysaccharide-23 03/31/2008, 08/19/2017   Td 02/10/2008   Tdap 12/18/2015, 09/22/2020, 02/26/2021   Zoster Recombinat (Shingrix) 01/25/2017   Zoster, Live 07/09/2005, 01/24/2006   Pertinent  Health Maintenance Due  Topic Date Due   INFLUENZA VACCINE  02/07/2023   DEXA SCAN  Completed   MAMMOGRAM  Discontinued   COLONOSCOPY (Pts 45-42yrs Insurance coverage will need to be confirmed)  Discontinued      04/17/2021    3:46 PM 06/07/2021    3:49 PM 08/15/2021    4:50 PM 03/05/2022    2:03 PM 10/31/2022    2:08 PM  Fall Risk  Falls in the past year? 0 0 0 1 1  Was there an injury with Fall? 0 0 0 1 0  Fall Risk Category Calculator 0 0 0 2 2  Fall Risk Category (Retired) Low Low Low Moderate   (RETIRED) Patient Fall Risk Level Low fall risk Low fall risk Low fall risk Moderate fall risk   Patient at Risk for Falls Due to No Fall Risks No Fall Risks No Fall Risks History of fall(s);Impaired balance/gait History of fall(s);Impaired balance/gait;Impaired mobility  Fall risk Follow up Falls evaluation completed Falls evaluation completed Falls evaluation completed Falls  evaluation completed;Education provided;Falls prevention discussed    Functional Status Survey:    Vitals:   10/31/22 1401  BP: 118/76  Pulse: (!) 113  Resp: 14  Temp: 97.6 F (36.4 C)  TempSrc: Temporal  SpO2: (!) 88%  Weight: 115 lb 9.6 oz (52.4 kg)  Height: 5\' 2"  (1.575 m)   Body mass index is 21.14 kg/m. Physical Exam Vitals reviewed.  Constitutional:      General: She is not in acute distress.    Appearance: Normal appearance. She is normal weight. She is not ill-appearing or diaphoretic.  HENT:     Head: Normocephalic.     Right Ear: Tympanic membrane, ear canal and external ear normal. There is no impacted cerumen.     Left Ear: Tympanic membrane, ear canal and external ear normal. There is no impacted cerumen.     Nose: Nose normal. No congestion or rhinorrhea.     Mouth/Throat:     Mouth: Mucous membranes are moist.     Pharynx: Oropharynx is clear. No oropharyngeal exudate or posterior oropharyngeal erythema.  Eyes:     General: No scleral icterus.       Right eye: No discharge.        Left eye: No discharge.     Extraocular Movements: Extraocular movements intact.     Conjunctiva/sclera: Conjunctivae normal.     Pupils: Pupils are equal, round, and reactive to light.  Neck:     Vascular: No carotid bruit.  Cardiovascular:     Rate and Rhythm: Normal rate and regular rhythm.     Pulses: Normal pulses.     Heart sounds: Normal heart sounds. No murmur heard.    No friction rub. No gallop.  Pulmonary:     Effort: Pulmonary effort is normal. No respiratory distress.     Breath sounds: Normal breath sounds. No wheezing, rhonchi or rales.  Chest:     Chest wall: No tenderness.  Abdominal:     General: Bowel sounds are normal. There is no distension.     Palpations: Abdomen is soft. There is no mass.     Tenderness: There is no abdominal tenderness. There is no right CVA tenderness, left CVA tenderness, guarding or rebound.  Musculoskeletal:        General:  No swelling or tenderness. Normal range of motion.     Cervical back: Normal range of motion. No rigidity or tenderness.     Right lower leg: No edema.     Left lower leg: No edema.  Lymphadenopathy:     Cervical: No cervical adenopathy.  Skin:    General: Skin is warm and dry.  Coloration: Skin is not pale.     Findings: No bruising, erythema, lesion or rash.  Neurological:     Mental Status: She is alert. Mental status is at baseline.     Cranial Nerves: No cranial nerve deficit.     Sensory: No sensory deficit.     Motor: No weakness.     Coordination: Coordination normal.     Gait: Gait abnormal.  Psychiatric:        Mood and Affect: Mood normal.        Speech: Speech normal.        Behavior: Behavior normal.        Thought Content: Thought content normal.        Judgment: Judgment normal.     Labs reviewed: Recent Labs    02/20/22 1345 09/03/22 0000  NA 140 141  K 4.8 4.5  CL 103 103  CO2 27 30  GLUCOSE 80 80  BUN 17 17  CREATININE 1.27* 1.26*  CALCIUM 9.0 9.1   Recent Labs    09/03/22 0000  AST 26  ALT 21  BILITOT 0.5  PROT 6.6   Recent Labs    02/20/22 1345 09/03/22 0000  WBC 10.3 9.8  NEUTROABS 7,818* 7,477  HGB 13.9 14.0  HCT 42.0 42.6  MCV 91.3 90.8  PLT 297 296   Lab Results  Component Value Date   TSH 0.86 08/16/2021   Lab Results  Component Value Date   HGBA1C 4.9 04/05/2020   Lab Results  Component Value Date   CHOL 166 09/03/2022   HDL 58 09/03/2022   LDLCALC 80 09/03/2022   LDLDIRECT 124.8 09/21/2011   TRIG 185 (H) 09/03/2022   CHOLHDL 2.9 09/03/2022    Significant Diagnostic Results in last 30 days:  No results found.  Assessment/Plan 1. Essential hypertension - Blood pressure well-controlled -Continue on amlodipine  2. Pure hypercholesterolemia Previous LDL under control but triglycerides are high -Continue with dietary modification and exercise as tolerated  3. Chronic obstructive pulmonary disease,  unspecified COPD type (HCC) Breathing stable -Oxygen 88% on room air on arrival 2 L of oxygen applied with much improvement  4. Gastroesophageal reflux disease without esophagitis Continue on omeprazole - omeprazole (PRILOSEC) 20 MG capsule; Take 2 capsules (40 mg total) by mouth daily as needed.  Dispense: 180 capsule; Refill: 1  5. Protein-calorie malnutrition, severe (HCC) Encouraged to drink Ensure 1 can at least once a day -Tends to eat past self in the room instead of going to the dining the facility.  Encouraged to increase protein in the diet.  6. Dementia with behavioral disturbance (HCC) No new behavioral issues reported Off Seroquel Continue on donepezil, memantine, Wellbutrin and clonazepam  7. Aortic atherosclerosis (HCC) -Continue on rosuvastatin and exercise as tolerated -Noted on aspirin or anticoagulant due to advanced age and high risk for falls  9. Chronic hypoxemic respiratory failure (HCC) Oxygen was 88 thenalidine [place] room air which improved with oxygen 2 L via nasal cannula -All advised to continue to use her portable oxygen  10. Stage 3a chronic kidney disease (HCC) Previous creatinine at goal -Continue to avoid nephrotoxins and dose for renal clearance  Family/ staff Communication: Reviewed plan of care with patient and son verbalized understanding  Labs/tests ordered: None   Next Appointment: Return in about 6 months (around 05/02/2023), or if symptoms worsen or fail to improve, for medical mangement of chronic issues.Caesar Bookman, NP

## 2023-01-08 ENCOUNTER — Encounter (HOSPITAL_COMMUNITY): Payer: Self-pay

## 2023-01-08 ENCOUNTER — Emergency Department (HOSPITAL_COMMUNITY): Payer: Medicare HMO

## 2023-01-08 ENCOUNTER — Other Ambulatory Visit: Payer: Self-pay

## 2023-01-08 ENCOUNTER — Emergency Department (HOSPITAL_COMMUNITY)
Admission: EM | Admit: 2023-01-08 | Discharge: 2023-01-08 | Disposition: A | Discharge status: Home / Self Care | Payer: Medicare HMO | Attending: Emergency Medicine | Admitting: Emergency Medicine

## 2023-01-08 DIAGNOSIS — Z87891 Personal history of nicotine dependence: Secondary | ICD-10-CM | POA: Insufficient documentation

## 2023-01-08 DIAGNOSIS — N183 Chronic kidney disease, stage 3 unspecified: Secondary | ICD-10-CM | POA: Diagnosis not present

## 2023-01-08 DIAGNOSIS — Z7951 Long term (current) use of inhaled steroids: Secondary | ICD-10-CM | POA: Diagnosis not present

## 2023-01-08 DIAGNOSIS — Z79899 Other long term (current) drug therapy: Secondary | ICD-10-CM | POA: Diagnosis not present

## 2023-01-08 DIAGNOSIS — R079 Chest pain, unspecified: Secondary | ICD-10-CM | POA: Diagnosis not present

## 2023-01-08 DIAGNOSIS — I2510 Atherosclerotic heart disease of native coronary artery without angina pectoris: Secondary | ICD-10-CM | POA: Diagnosis not present

## 2023-01-08 DIAGNOSIS — R0789 Other chest pain: Secondary | ICD-10-CM | POA: Diagnosis not present

## 2023-01-08 DIAGNOSIS — J45909 Unspecified asthma, uncomplicated: Secondary | ICD-10-CM | POA: Insufficient documentation

## 2023-01-08 DIAGNOSIS — F0390 Unspecified dementia without behavioral disturbance: Secondary | ICD-10-CM | POA: Diagnosis not present

## 2023-01-08 DIAGNOSIS — J449 Chronic obstructive pulmonary disease, unspecified: Secondary | ICD-10-CM | POA: Insufficient documentation

## 2023-01-08 DIAGNOSIS — R911 Solitary pulmonary nodule: Secondary | ICD-10-CM | POA: Diagnosis not present

## 2023-01-08 DIAGNOSIS — I129 Hypertensive chronic kidney disease with stage 1 through stage 4 chronic kidney disease, or unspecified chronic kidney disease: Secondary | ICD-10-CM | POA: Insufficient documentation

## 2023-01-08 DIAGNOSIS — J432 Centrilobular emphysema: Secondary | ICD-10-CM | POA: Diagnosis not present

## 2023-01-08 LAB — CBC WITH DIFFERENTIAL/PLATELET
Abs Immature Granulocytes: 0.02 10*3/uL (ref 0.00–0.07)
Basophils Absolute: 0.1 10*3/uL (ref 0.0–0.1)
Basophils Relative: 1 %
Eosinophils Absolute: 0.1 10*3/uL (ref 0.0–0.5)
Eosinophils Relative: 2 %
HCT: 40 % (ref 36.0–46.0)
Hemoglobin: 12.8 g/dL (ref 12.0–15.0)
Immature Granulocytes: 0 %
Lymphocytes Relative: 19 %
Lymphs Abs: 1.5 10*3/uL (ref 0.7–4.0)
MCH: 30.3 pg (ref 26.0–34.0)
MCHC: 32 g/dL (ref 30.0–36.0)
MCV: 94.6 fL (ref 80.0–100.0)
Monocytes Absolute: 0.7 10*3/uL (ref 0.1–1.0)
Monocytes Relative: 9 %
Neutro Abs: 5.3 10*3/uL (ref 1.7–7.7)
Neutrophils Relative %: 69 %
Platelets: 246 10*3/uL (ref 150–400)
RBC: 4.23 MIL/uL (ref 3.87–5.11)
RDW: 13.6 % (ref 11.5–15.5)
WBC: 7.7 10*3/uL (ref 4.0–10.5)
nRBC: 0 % (ref 0.0–0.2)

## 2023-01-08 LAB — COMPREHENSIVE METABOLIC PANEL
ALT: 17 U/L (ref 0–44)
AST: 23 U/L (ref 15–41)
Albumin: 2.6 g/dL — ABNORMAL LOW (ref 3.5–5.0)
Alkaline Phosphatase: 62 U/L (ref 38–126)
Anion gap: 10 (ref 5–15)
BUN: 10 mg/dL (ref 8–23)
CO2: 23 mmol/L (ref 22–32)
Calcium: 7.8 mg/dL — ABNORMAL LOW (ref 8.9–10.3)
Chloride: 108 mmol/L (ref 98–111)
Creatinine, Ser: 1.07 mg/dL — ABNORMAL HIGH (ref 0.44–1.00)
GFR, Estimated: 53 mL/min — ABNORMAL LOW (ref >60)
Glucose, Bld: 75 mg/dL (ref 70–99)
Potassium: 4.5 mmol/L (ref 3.5–5.1)
Sodium: 141 mmol/L (ref 135–145)
Total Bilirubin: 0.9 mg/dL (ref 0.3–1.2)
Total Protein: 5.5 g/dL — ABNORMAL LOW (ref 6.5–8.1)

## 2023-01-08 LAB — TROPONIN I (HIGH SENSITIVITY)
Troponin I (High Sensitivity): 6 ng/L (ref <18)
Troponin I (High Sensitivity): 7 ng/L (ref <18)

## 2023-01-08 LAB — D-DIMER, QUANTITATIVE: D-Dimer, Quant: 0.92 ug/mL-FEU — ABNORMAL HIGH (ref 0.00–0.50)

## 2023-01-08 MED ORDER — IOHEXOL 350 MG/ML SOLN
75.0000 mL | Freq: Once | INTRAVENOUS | Status: AC | PRN
  Administered 2023-01-08: 75 mL via INTRAVENOUS

## 2023-01-08 MED ORDER — IPRATROPIUM-ALBUTEROL 0.5-2.5 (3) MG/3ML IN SOLN
3.0000 mL | Freq: Once | RESPIRATORY_TRACT | Status: AC
  Administered 2023-01-08: 3 mL via RESPIRATORY_TRACT
  Filled 2023-01-08: qty 3

## 2023-01-08 MED ORDER — HYDROCODONE-ACETAMINOPHEN 5-325 MG PO TABS
1.0000 | ORAL_TABLET | Freq: Once | ORAL | Status: AC
  Administered 2023-01-08: 1 via ORAL
  Filled 2023-01-08: qty 1

## 2023-01-08 NOTE — ED Triage Notes (Signed)
PT BIB EMS for chest pain from [facility], called EMS yesterday but declined to be brought to the hospital.  Dementia at baseline, states the pain comes and goes, does not radiate.    O2 92% RA   is supposed to wear 2L [place] at baseline but does not  324 Aspirin prior to EMS arrival 20 L [facility]

## 2023-01-08 NOTE — ED Provider Notes (Signed)
[place] EMERGENCY DEPARTMENT AT [facility] Provider Note  CSN: [id number] Arrival date & time: 01/08/23 1554  Chief Complaint(s) Chest Pain  HPI Andrea Larson is a 77 y.o. female history of dementia, COPD with intermittent oxygen compliance, hypertension, hyperlipidemia presenting to the emergency department chest pain.  Patient reports left-sided chest pain since yesterday.  Given dementia patient is a limited historian, does not think it is worse with deep breathing, unclear if it is worse with exertion.  She does reports it is worse with movement and sitting up.  She denies shortness of breath, fevers or chills, recent travel or surgery.  No fainting.  Patient was evaluated by EMS yesterday for symptoms at her assisted living facility, but refused hospital transport.  Currently EMS was called again today and the son encouraged the patient to come to the hospital.   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) 03/15/2022   Dysphagia    Protein-calorie malnutrition, severe 04/10/2021   Aortic atherosclerosis (HCC) 04/04/2021   Depression with anxiety    History of colon polyps 10/17/2020   Vitamin D deficiency 10/17/2020   Dementia with behavioral disturbance 08/09/2020   Gait abnormality 08/09/2020   Chronic hypoxemic respiratory failure (HCC) 04/10/2020   Peripheral edema 05/21/2018   Gait disorder  05/21/2018   CKD (chronic kidney disease), stage III (HCC) 04/28/2018   Malnutrition of moderate degree 04/13/2018   Essential hypertension 04/11/2018   Gallstones 04/11/2018   Coronary artery calcification 08/23/2016   Aortic stenosis 08/23/2016   Insomnia 08/18/2015   GERD (gastroesophageal reflux disease) 08/18/2015   Back pain 10/28/2012   Asthma 09/21/2011   Allergic rhinitis 09/21/2011   Colon polyps 09/21/2011   Encounter for well adult exam with abnormal findings 09/15/2011   DEGENERATIVE JOINT DISEASE, CERVICAL SPINE 10/21/2008   Overweight(278.02) 02/10/2008   ANXIETY DEPRESSION 02/10/2008   COPD (chronic obstructive pulmonary disease) (HCC) 02/10/2008   EMPHYSEMA NEC 01/30/2007   Hyperlipidemia 12/31/2006   Home Medication(s) Prior to Admission medications   Medication Sig Start Date End Date Taking? Authorizing Provider  acetaminophen (TYLENOL) 325 MG tablet Take 650 mg by mouth every 6 (six) hours as needed for mild pain or headache.    [provider]  amLODipine (NORVASC) 5 MG tablet TAKE ONE TABLET BY MOUTH DAILY 10/29/22   Ngetich, Dinah C, NP  buPROPion (WELLBUTRIN XL) 150 MG 24 hr tablet TAKE ONE TABLET BY MOUTH DAILY 10/29/22   Ngetich, Dinah C, NP  Cholecalciferol (THERA-D 2000) 50 MCG (2000 UT) TABS 1 tab by mouth once daily 12/16/20   Corwin Levins, MD  clonazePAM (KLONOPIN) 0.5 MG tablet Take 1 tablet (0.5 mg total) by mouth 2 (two) times daily as needed for anxiety. 10/24/22   Ngetich, Dinah C, NP  donepezil (ARICEPT) 5 MG tablet TAKE ONE TABLET BY MOUTH AT BEDTIME 10/29/22  Ngetich, Dinah C, NP  FLUoxetine (PROZAC) 40 MG capsule TAKE ONE CAPSULE BY MOUTH DAILY 07/30/22   Ngetich, Dinah C, NP  fluticasone (FLONASE) 50 MCG/ACT nasal spray USE TWO SPRAYS in each nostril ONCE DAILY. 08/14/21   Corwin Levins, MD  folic acid (FOLVITE) 1 MG tablet TAKE ONE TABLET BY MOUTH DAILY 07/30/22   Ngetich, Dinah C, NP  ipratropium (ATROVENT) 0.02 % nebulizer solution  NEBULIZE CONTENTS OF 1 VIAL 3 TIMES A DAY 04/16/22   Ngetich, Dinah C, NP  levalbuterol (XOPENEX) 0.63 MG/3ML nebulizer solution USE ONE vial via nebulization every SIX hours as needed FOR SHORTNESS OF BREATH OR wheezing. 06/23/21   Ngetich, Dinah C, NP  melatonin 5 MG TABS Take 5 mg by mouth at bedtime.    [provider]  memantine (NAMENDA) 10 MG tablet TAKE ONE TABLET BY MOUTH TWICE DAILY 07/30/22   Ngetich, Dinah C, NP  mirtazapine (REMERON) 15 MG tablet TAKE ONE TABLET BY MOUTH AT BEDTIME 07/30/22   Ngetich, Dinah C, NP  montelukast (SINGULAIR) 10 MG tablet TAKE ONE TABLET BY MOUTH DAILY 10/29/22   Ngetich, Dinah C, NP  Multiple Vitamin (MULTIVITAMIN WITH MINERALS) TABS tablet Take 1 tablet by mouth daily. 04/12/21   Rodolph Bong, MD  omeprazole (PRILOSEC) 20 MG capsule Take 2 capsules (40 mg total) by mouth daily as needed. 10/31/22   Ngetich, Donalee Citrin, NP  Respiratory Therapy Supplies (FLUTTER) DEVI Use as directed 08/19/17   Mannam, Colbert Coyer, MD  rosuvastatin (CRESTOR) 20 MG tablet TAKE ONE TABLET BY MOUTH DAILY 10/29/22   Ngetich, Dinah C, NP  thiamine 100 MG tablet Take 1 tablet (100 mg total) by mouth daily. 04/17/21   Ngetich, Dinah C, NP  TRELEGY ELLIPTA 200-62.5-25 MCG/ACT AEPB INHALE 1 PUFF INTO THE LUNGS ONCE DAILY AS DIRECTED. 08/30/22   Ngetich, Dinah C, NP  vitamin B-12 (CYANOCOBALAMIN) 1000 MCG tablet Take 1 tablet (1,000 mcg total) by mouth daily. 04/11/21   Rodolph Bong, MD                                                                                                                                    Past Surgical History Past Surgical History:  Procedure Laterality Date   DENTAL SURGERY     pilonidial cyst     TONSILLECTOMY     Family History Family History  Problem Relation Age of Onset   Alcohol abuse Other    Arthritis Other    Heart disease Other    Stroke Other    Hypertension Other    Hypertension Father    Stroke Father    Atrial fibrillation  Brother    Other Mother        unsure of medical history   Colon cancer Neg Hx     Social History Social History   Tobacco Use   Smoking status: Former    Packs/day: 0.75  Years: 55.00    Additional pack years: 0.00    Total pack years: 41.25    Types: Cigarettes    Quit date: 04/18/2015    Years since quitting: 7.7   Smokeless tobacco: Never  Vaping Use   Vaping Use: Never used  Substance Use Topics   Alcohol use: Not Currently    Comment: wine couple times each week - daily at times   Drug use: Never   Allergies Lipitor [atorvastatin]  Review of Systems Review of Systems  All other systems reviewed and are negative.   Physical Exam Vital Signs  I have reviewed the triage vital signs BP 112/70   Pulse 73   Temp 97.8 F (36.6 C) (Oral)   Resp 19   Ht 5\' 2"  (1.575 m)   Wt 54.4 kg   SpO2 98%   BMI 21.95 kg/m  Physical Exam Vitals and nursing note reviewed.  Constitutional:      General: She is not in acute distress.    Appearance: She is well-developed.  HENT:     Head: Normocephalic and atraumatic.     Mouth/Throat:     Mouth: Mucous membranes are moist.  Eyes:     Pupils: Pupils are equal, round, and reactive to light.  Cardiovascular:     Rate and Rhythm: Normal rate and regular rhythm.     Heart sounds: No murmur heard. Pulmonary:     Effort: Pulmonary effort is normal. No respiratory distress.     Breath sounds: Wheezing present.  Chest:     Chest wall: Tenderness (left lateral chest wall, reproduces pain, no rash) present.  Abdominal:     General: Abdomen is flat.     Palpations: Abdomen is soft.     Tenderness: There is no abdominal tenderness.  Musculoskeletal:        General: No tenderness.     Right lower leg: No edema.     Left lower leg: No edema.  Skin:    General: Skin is warm and dry.  Neurological:     General: No focal deficit present.     Mental Status: She is alert. Mental status is at baseline.  Psychiatric:         Mood and Affect: Mood normal.        Behavior: Behavior normal.     ED Results and Treatments Labs (all labs ordered are listed, but only abnormal results are displayed) Labs Reviewed  COMPREHENSIVE METABOLIC PANEL - Abnormal; Notable for the following components:      Result Value   Creatinine, Ser 1.07 (*)    Calcium 7.8 (*)    Total Protein 5.5 (*)    Albumin 2.6 (*)    GFR, Estimated 53 (*)    All other components within normal limits  D-DIMER, QUANTITATIVE - Abnormal; Notable for the following components:   D-Dimer, Quant 0.92 (*)    All other components within normal limits  CBC WITH DIFFERENTIAL/PLATELET  TROPONIN I (HIGH SENSITIVITY)  TROPONIN I (HIGH SENSITIVITY)  Radiology CT Angio Chest PE W and/or Wo Contrast  Result Date: 01/08/2023 CLINICAL DATA:  Elevated D-dimer levels.  Chest pain. EXAM: CT ANGIOGRAPHY CHEST WITH CONTRAST TECHNIQUE: Multidetector CT imaging of the chest was performed using the standard protocol during bolus administration of intravenous contrast. Multiplanar CT image reconstructions and MIPs were obtained to evaluate the vascular anatomy. RADIATION DOSE REDUCTION: This exam was performed according to the departmental dose-optimization program which includes automated exposure control, adjustment of the mA and/or kV according to patient size and/or use of iterative reconstruction technique. CONTRAST:  75mL OMNIPAQUE IOHEXOL 350 MG/ML SOLN COMPARISON:  07/04/2022 FINDINGS: Cardiovascular: No filling defect is identified in the pulmonary arterial tree to suggest pulmonary embolus. Coronary, aortic arch, and branch vessel atherosclerotic vascular disease. Aortic valve calcifications and mild mitral valvular calcifications. Mediastinum/Nodes: Unremarkable Lungs/Pleura: Mild biapical pleuroparenchymal scarring, right greater than left.  Centrilobular emphysema. Mild atelectasis in the posterior basal segment left lower lobe. Stable 3 by 4 mm right upper lobe nodule on image 33 series 7. 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 There is also atheromatous plaque narrowing the proximal celiac trunk. No overt occlusion of either vessel is identified. Musculoskeletal: Old healed right rib fractures. Degenerative sternoclavicular arthropathy, right greater than left. Old compression fractures at T5, T7, and T9. New (compared to 07/04/2022) anterior and middle column fracture of the T8 vertebral body with visible fracture plane below the superior endplate on image 90 of series 9, and only about 2 mm of posterior bony retropulsion. Currently there is 20% loss of vertebral body height. New compression fracture involving the anterior middle columns at T10, with only 1-2 mm of posterior bony retropulsion. Currently there is nearly 50% loss of vertebral body height. The multiple thoracic compression fractures imply osteoporosis. Review of the MIP images confirms the above findings. IMPRESSION: 1. No filling defect is identified in the pulmonary arterial tree to suggest pulmonary embolus. 2. New compression fractures at T8 and T10 compared to 07/04/2022. These involve the anterior middle columns, but have only 1-2 mm of posterior bony retropulsion. 3. Stable 3 by 4 mm right upper lobe nodule. Chest CT surveillance as per prior lung cancer screening recommendations issued on 07/04/2022 are reiterated. 4. Aortic valve calcifications and mild mitral valvular calcifications. 5. 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 There is also atheromatous plaque narrowing the proximal celiac trunk. No overt occlusion of either vessel is identified. 6. Old healed right rib fractures. 7. Degenerative sternoclavicular  arthropathy, right greater than left. 8. Multiple thoracic compression fractures imply osteoporosis. Aortic Atherosclerosis (ICD10-I70.0) and Emphysema (ICD10-J43.9). Electronically Signed   By: Gaylyn Rong M.D.   On: 01/08/2023 20:03   DG Chest Port 1 View  Result Date: 01/08/2023 CLINICAL DATA:  chest pain EXAM: PORTABLE CHEST 1 VIEW COMPARISON:  None Available. FINDINGS: No pleural effusion. No pneumothorax. No focal airspace opacity. Normal cardiac and mediastinal contours. No radiographically apparent displaced rib fractures. Visualized upper abdomen is unremarkable. IMPRESSION: No focal airspace opacity. Electronically Signed   By: Lorenza Cambridge M.D.   On: 01/08/2023 17:42    Pertinent labs & imaging results that were available during my care of the patient were reviewed by me and considered in my medical decision making (see MDM for details).  Medications Ordered in ED Medications  HYDROcodone-acetaminophen (NORCO/VICODIN) 5-325 MG per tablet 1 tablet (1 tablet Oral Given 01/08/23 1653)  ipratropium-albuterol (DUONEB) 0.5-2.5 (3)  MG/3ML nebulizer solution 3 mL (3 mLs Nebulization Given 01/08/23 1653)  iohexol (OMNIPAQUE) 350 MG/ML injection 75 mL (75 mLs Intravenous Contrast Given 01/08/23 1949)                                                                                                                                     Procedures Procedures  (including critical care time)  Medical Decision Making / ED Course   MDM:  77 year old female with history of COPD presenting to the emergency department chest pain.  History of mildly limited by underlying dementia but seems most likely pain is musculoskeletal.  She has no acute EKG changes.  Pain is somewhat reproducible on palpation and is worse with movement.  Low concern for pulmonary embolism but given age and comorbidities will check D-dimer.  Doubt ACS given atypical nature of symptoms, no EKG changes but will check troponin.   Will check chest x-ray to evaluate for pneumothorax or pneumonia although patient has no cough.  She does have wheezing, she denies shortness of breath, do not think this would be related to her symptoms but will trial breathing treatment.  Also give Norco for pain.  Will reassess.  Clinical Course as of 01/08/23 2147  Tue Jan 08, 2023  2145 Troponin negative x 2.  D-dimer was elevated and CT angiography performed without evidence of pulmonary embolism.  Has some chronic findings such as old compression fractures, vascular disease, known lung nodule which I discussed the patient daughter-in-law. Suspect cause of pain is chest wall pain as previously discussed. Will discharge patient to home. All questions answered. Patient comfortable with plan of discharge. Return precautions discussed with patient and specified on the after visit summary.  [WS]    Clinical Course User Index [WS] Suezanne Jacquet, Jerilee Field, MD     Additional history obtained: -Additional history obtained from family -External records from outside source obtained and reviewed including: Chart review including previous notes, labs, imaging, consultation notes including prior CT reports   Lab Tests: -I ordered, reviewed, and interpreted labs.   The pertinent results include:   Labs Reviewed  COMPREHENSIVE METABOLIC PANEL - Abnormal; Notable for the following components:      Result Value   Creatinine, Ser 1.07 (*)    Calcium 7.8 (*)    Total Protein 5.5 (*)    Albumin 2.6 (*)    GFR, Estimated 53 (*)    All other components within normal limits  D-DIMER, QUANTITATIVE - Abnormal; Notable for the following components:   D-Dimer, Quant 0.92 (*)    All other components within normal limits  CBC WITH DIFFERENTIAL/PLATELET  TROPONIN I (HIGH SENSITIVITY)  TROPONIN I (HIGH SENSITIVITY)    Notable for elevated d-dimer  EKG   EKG Interpretation Date/Time:  Tuesday January 08 2023 16:13:45 EDT Ventricular Rate:  77 PR  Interval:  150 QRS Duration:  108 QT Interval:  390 QTC Calculation: 442 R  Axis:   119  Text Interpretation: Sinus rhythm Atrial premature complex Right axis deviation No significant change since last tracing Confirmed by Alvino Blood ([id number]) on 01/08/2023 4:43:35 PM         Imaging Studies ordered: I ordered imaging studies including CTA chest On my interpretation imaging demonstrates no PE, chronic findings I independently visualized and interpreted imaging. I agree with the radiologist interpretation   Medicines ordered and prescription drug management: Meds ordered this encounter  Medications   HYDROcodone-acetaminophen (NORCO/VICODIN) 5-325 MG per tablet 1 tablet   ipratropium-albuterol (DUONEB) 0.5-2.5 (3) MG/3ML nebulizer solution 3 mL   iohexol (OMNIPAQUE) 350 MG/ML injection 75 mL    -I have reviewed the patients home medicines and have made adjustments as needed   Cardiac Monitoring: The patient was maintained on a cardiac monitor.  I personally viewed and interpreted the cardiac monitored which showed an underlying rhythm of: NSR  Social Determinants of Health:  Diagnosis or treatment significantly limited by social determinants of health: former smoker   Reevaluation: After the interventions noted above, I reevaluated the patient and found that their symptoms have improved  Co morbidities that complicate the patient evaluation  Past Medical History:  Diagnosis Date   Allergic rhinitis    Allergic rhinitis, cause unspecified 09/21/2011   Aortic stenosis 08/23/2016   Arthritis    Asthma    Asthma 09/21/2011   Colon polyps    COPD (chronic obstructive pulmonary disease) (HCC)    mild    Coronary artery calcification 08/23/2016   Dementia (HCC)    Depression with anxiety    HTN (hypertension)    Hyperlipidemia    UTI (lower urinary tract infection)       Dispostion: Disposition decision including need for hospitalization was considered, and  patient discharged from emergency department.    Final Clinical Impression(s) / ED Diagnoses Final diagnoses:  Chest wall pain     This chart was dictated using voice recognition software.  Despite best efforts to proofread,  errors can occur which can change the documentation meaning.    Lonell Grandchild, MD 01/08/23 2147

## 2023-01-08 NOTE — Discharge Instructions (Addendum)
We evaluated you for your chest pain.  Your testing including cardiac enzymes and CT scan to evaluate for blood clot was all negative.  We also did not see any signs of a broken rib or collapsed lung.  Your CT scan showed some chronic findings like old fractures and and the lung nodule that you have already been seen for.  Your CT scan also showed calcium deposits in your blood vessels.  Some of these are close to your abdominal blood vessels.  We did not see any sign of a problem with blood flow to your abdomen but if you develop abdominal pain you should come back to the emergency department.  If you develop any new or worsening symptoms, difficulty breathing, nausea or vomiting, worsening or severe pain, please return to the emergency department for repeat evaluation.

## 2023-01-09 ENCOUNTER — Telehealth: Payer: Self-pay

## 2023-01-09 NOTE — Transitions of Care (Post Inpatient/ED Visit) (Signed)
01/09/2023  Name: MARCUS SANTIN MRN: [id number] DOB: 1946-03-19  Today's TOC FU Call Status:  Completed on 01/09/2023  Transition Care Management Follow-up Telephone Call    Items Reviewed: none at this time    Medications Reviewed Today: Medications Reviewed Today     Reviewed by Caesar Bookman, NP (Nurse Practitioner) on 11/05/22 at 0601  Med List Status: <None>   Medication Order Taking? Sig Documenting Provider Last Dose Status Informant  acetaminophen (TYLENOL) 325 MG tablet [id number] Yes Take 650 mg by mouth every 6 (six) hours as needed for mild pain or headache. [provider] Taking Active Care Giver  amLODipine (NORVASC) 5 MG tablet [id number] Yes TAKE ONE TABLET BY MOUTH DAILY Ngetich, Dinah C, NP Taking Active   buPROPion (WELLBUTRIN XL) 150 MG 24 hr tablet [id number] Yes TAKE ONE TABLET BY MOUTH DAILY Ngetich, Dinah C, NP Taking Active   Cholecalciferol (THERA-D 2000) 50 MCG (2000 UT) TABS [id number] Yes 1 tab by mouth once daily Corwin Levins, MD Taking Active Care Giver  clonazePAM [facility]) 0.5 MG tablet [id number] Yes Take 1 tablet (0.5 mg total) by mouth 2 (two) times daily as needed for anxiety. Ngetich, Dinah C, NP Taking Active   donepezil (ARICEPT) 5 MG tablet [id number] Yes TAKE ONE TABLET BY MOUTH AT BEDTIME Ngetich, Dinah C, NP Taking Active   FLUoxetine (PROZAC) 40 MG capsule [id number] Yes TAKE ONE CAPSULE BY MOUTH DAILY Ngetich, Dinah C, NP Taking Active   fluticasone (FLONASE) 50 MCG/ACT nasal spray [id number] Yes USE TWO SPRAYS in each nostril ONCE DAILY. Corwin Levins, MD Taking Active   folic acid (FOLVITE) 1 MG tablet [id number] Yes TAKE ONE TABLET BY MOUTH DAILY Ngetich, Dinah C, NP Taking Active   ipratropium (ATROVENT) 0.02 % nebulizer solution [id number] Yes NEBULIZE CONTENTS OF 1 VIAL 3 TIMES A DAY Ngetich, Dinah C, NP Taking Active   levalbuterol (XOPENEX) 0.63 MG/3ML nebulizer solution [id number] Yes USE ONE vial via nebulization every SIX  hours as needed FOR SHORTNESS OF BREATH OR wheezing. Ngetich, Dinah C, NP Taking Active   melatonin 5 MG TABS [id number] Yes Take 5 mg by mouth at bedtime. [provider] Taking Active   memantine (NAMENDA) 10 MG tablet [id number] Yes TAKE ONE TABLET BY MOUTH TWICE DAILY Ngetich, Dinah C, NP Taking Active   mirtazapine (REMERON) 15 MG tablet [id number] Yes TAKE ONE TABLET BY MOUTH AT BEDTIME Ngetich, Dinah C, NP Taking Active   montelukast (SINGULAIR) 10 MG tablet [id number] Yes TAKE ONE TABLET BY MOUTH DAILY Ngetich, Dinah C, NP Taking Active   Multiple Vitamin (MULTIVITAMIN WITH MINERALS) TABS tablet [id number] Yes Take 1 tablet by mouth daily. Rodolph Bong, MD Taking Active    Discontinued 10/31/22 1403 (Patient Preference)   omeprazole (PRILOSEC) 20 MG capsule [id number] Yes Take 2 capsules (40 mg total) by mouth daily as needed. Ngetich, Donalee Citrin, NP  Active   Patient not taking:  Discontinued 10/31/22 1442 (Discontinued by provider)            Med Note>> Ngetich, Dinah C, NP   10/31/2022  2:42 PM none in use    Respiratory Therapy Supplies (FLUTTER) DEVI [id number] Yes Use as directed Chilton Greathouse, MD Taking Active Care Giver  rosuvastatin (CRESTOR) 20 MG tablet [id number] Yes TAKE ONE TABLET BY MOUTH DAILY Ngetich, Dinah C, NP Taking Active   thiamine 100 MG tablet [id number] Yes Take 1 tablet (100 mg total) by mouth daily. Ngetich, Donalee Citrin, NP Taking Active  TRELEGY ELLIPTA 200-62.5-25 MCG/ACT AEPB [id number] Yes INHALE 1 PUFF INTO THE LUNGS ONCE DAILY AS DIRECTED. Ngetich, Dinah C, NP Taking Active   vitamin B-12 (CYANOCOBALAMIN) 1000 MCG tablet [id number] Yes Take 1 tablet (1,000 mcg total) by mouth daily. Rodolph Bong, MD Taking Active   Med List Note Marsa Aris 04/07/21 2056): Nurse Aide [phone] Sunny Schlein)            Home Care and Equipment/Supplies:    Functional Questionnaire:    Follow up appointments reviewed:      SIGNATURE:  [facility]

## 2023-01-11 ENCOUNTER — Ambulatory Visit (INDEPENDENT_AMBULATORY_CARE_PROVIDER_SITE_OTHER): Payer: Medicare HMO | Admitting: Family

## 2023-01-11 ENCOUNTER — Encounter: Payer: Self-pay | Admitting: Family

## 2023-01-11 VITALS — BP 126/82 | HR 88 | Temp 98.0°F | Ht 62.0 in | Wt 110.6 lb

## 2023-01-11 DIAGNOSIS — R0789 Other chest pain: Secondary | ICD-10-CM | POA: Diagnosis not present

## 2023-01-11 DIAGNOSIS — I10 Essential (primary) hypertension: Secondary | ICD-10-CM

## 2023-01-11 DIAGNOSIS — E43 Unspecified severe protein-calorie malnutrition: Secondary | ICD-10-CM

## 2023-01-11 LAB — CBC WITH DIFFERENTIAL/PLATELET
Absolute Monocytes: 494 cells/uL (ref 200–950)
Hemoglobin: 13.2 g/dL (ref 11.7–15.5)
RBC: 4.48 10*6/uL (ref 3.80–5.10)
Total Lymphocyte: 31 %
WBC: 6.5 10*3/uL (ref 3.8–10.8)

## 2023-01-11 MED ORDER — ENSURE ACTIVE HIGH PROTEIN PO LIQD
1.0000 | Freq: Every day | ORAL | 5 refills | Status: DC
Start: 2023-01-11 — End: 2023-06-03

## 2023-01-11 NOTE — Patient Instructions (Signed)
-   Please check weight weekly and notify provider for any weight loss   - Drink Ensure one can daily due to weight loss

## 2023-01-11 NOTE — Progress Notes (Signed)
Provider: Richarda Blade FNP-C  Dardan Shelton, Donalee Citrin, NP  Patient Care Team: Clearnce Leja, Donalee Citrin, NP as PCP - General (Family Medicine) Chilton Si, MD as PCP - Cardiology (Cardiology)  Extended Emergency Contact Information Primary Emergency Contact: [facility] Address: [address]          [place], [place] [address] [place] of [place] Work Phone: [phone] Mobile Phone: [phone] Relation: Son Secondary Emergency Contact: Priscille Loveless [place] of [place] Home Phone: [phone] Relation: Brother  Code Status:  DNR Goals of care: Advanced Directive information    01/08/2023    4:04 PM  Advanced Directives  Does Patient Have a Medical Advance Directive? Yes     Chief Complaint  Patient presents with  . Acute Visit    Patient presents today for a follow-up form the emergency department on 01/08/23. She was seen at [facility] for chest pain.    HPI:  Pt is a 77 y.o. female seen today for an acute visit for evaluation of    Past Medical History:  Diagnosis Date  . Allergic rhinitis   . Allergic rhinitis, cause unspecified 09/21/2011  . Aortic stenosis 08/23/2016  . Arthritis   . Asthma   . Asthma 09/21/2011  . Colon polyps   . COPD (chronic obstructive pulmonary disease) (HCC)    mild   . Coronary artery calcification 08/23/2016  . Dementia (HCC)   . Depression with anxiety   . HTN (hypertension)   . Hyperlipidemia   . UTI (lower urinary tract infection)    Past Surgical History:  Procedure Laterality Date  . DENTAL SURGERY    . pilonidial cyst    . TONSILLECTOMY      Allergies  Allergen Reactions  . Lipitor [Atorvastatin] Other (See Comments)    Myalgias    Outpatient Encounter Medications as of 01/11/2023  Medication Sig  . acetaminophen (TYLENOL) 325 MG tablet Take 650 mg by mouth every 6 (six) hours as needed for mild pain or headache.  Marland Kitchen amLODipine (NORVASC) 5 MG tablet TAKE ONE TABLET BY MOUTH DAILY  .  buPROPion (WELLBUTRIN XL) 150 MG 24 hr tablet TAKE ONE TABLET BY MOUTH DAILY  . Cholecalciferol (THERA-D 2000) 50 MCG (2000 UT) TABS 1 tab by mouth once daily  . clonazePAM (KLONOPIN) 0.5 MG tablet Take 1 tablet (0.5 mg total) by mouth 2 (two) times daily as needed for anxiety.  . donepezil (ARICEPT) 5 MG tablet TAKE ONE TABLET BY MOUTH AT BEDTIME  . FLUoxetine (PROZAC) 40 MG capsule TAKE ONE CAPSULE BY MOUTH DAILY  . fluticasone (FLONASE) 50 MCG/ACT nasal spray USE TWO SPRAYS in each nostril ONCE DAILY.  . folic acid (FOLVITE) 1 MG tablet TAKE ONE TABLET BY MOUTH DAILY  . ipratropium (ATROVENT) 0.02 % nebulizer solution NEBULIZE CONTENTS OF 1 VIAL 3 TIMES A DAY  . levalbuterol (XOPENEX) 0.63 MG/3ML nebulizer solution USE ONE vial via nebulization every SIX hours as needed FOR SHORTNESS OF BREATH OR wheezing.  . melatonin 5 MG TABS Take 5 mg by mouth at bedtime.  . memantine (NAMENDA) 10 MG tablet TAKE ONE TABLET BY MOUTH TWICE DAILY  . mirtazapine (REMERON) 15 MG tablet TAKE ONE TABLET BY MOUTH AT BEDTIME  . montelukast (SINGULAIR) 10 MG tablet TAKE ONE TABLET BY MOUTH DAILY  . Multiple Vitamin (MULTIVITAMIN WITH MINERALS) TABS tablet Take 1 tablet by mouth daily.  Marland Kitchen omeprazole (PRILOSEC) 20 MG capsule Take 2 capsules (40 mg total) by mouth daily as needed.  Marland Kitchen  Respiratory Therapy Supplies (FLUTTER) DEVI Use as directed  . rosuvastatin (CRESTOR) 20 MG tablet TAKE ONE TABLET BY MOUTH DAILY  . thiamine 100 MG tablet Take 1 tablet (100 mg total) by mouth daily.  . TRELEGY ELLIPTA 200-62.5-25 MCG/ACT AEPB INHALE 1 PUFF INTO THE LUNGS ONCE DAILY AS DIRECTED.  Marland Kitchen vitamin B-12 (CYANOCOBALAMIN) 1000 MCG tablet Take 1 tablet (1,000 mcg total) by mouth daily.   No facility-administered encounter medications on file as of 01/11/2023.    Review of Systems  Constitutional:  Negative for appetite change, chills, fatigue, fever and unexpected weight change.  HENT:  Negative for congestion, dental problem,  ear discharge, ear pain, facial swelling, hearing loss, nosebleeds, postnasal drip, rhinorrhea, sinus pressure, sinus pain, sneezing, sore throat, tinnitus and trouble swallowing.   Eyes:  Negative for pain, discharge, redness, itching and visual disturbance.  Respiratory:  Negative for cough, chest tightness, shortness of breath and wheezing.   Cardiovascular:  Negative for chest pain, palpitations and leg swelling.  Gastrointestinal:  Negative for abdominal distention, abdominal pain, blood in stool, constipation, diarrhea, nausea and vomiting.  Endocrine: Negative for cold intolerance, heat intolerance, polydipsia, polyphagia and polyuria.  Genitourinary:  Negative for difficulty urinating, dysuria, flank pain, frequency and urgency.  Musculoskeletal:  Positive for arthralgias and gait problem. Negative for back pain, joint swelling, myalgias, neck pain and neck stiffness.  Skin:  Negative for color change, pallor, rash and wound.  Neurological:  Negative for dizziness, syncope, speech difficulty, weakness, light-headedness, numbness and headaches.  Hematological:  Does not bruise/bleed easily.  Psychiatric/Behavioral:  Negative for agitation, behavioral problems, confusion, hallucinations, self-injury, sleep disturbance and suicidal ideas. The patient is not nervous/anxious.     Immunization History  Administered Date(s) Administered  . Fluad Quad(high Dose 65+) 04/15/2019, 04/05/2020, 08/16/2021  . Influenza Split 04/11/2011  . Influenza Whole 03/31/2008  . Influenza, High Dose Seasonal PF 04/29/2013, 03/18/2018  . Influenza,inj,Quad PF,6+ Mos 05/07/2014  . Influenza-Unspecified 05/13/2015, 04/19/2017  . PFIZER(Purple Top)SARS-COV-2 Vaccination 08/01/2019, 08/23/2019, 04/25/2020  . Pneumococcal Conjugate-13 05/13/2013  . Pneumococcal Polysaccharide-23 03/31/2008, 08/19/2017  . Td 02/10/2008  . Tdap 12/18/2015, 09/22/2020, 02/26/2021  . Zoster Recombinant(Shingrix) 01/25/2017  .  Zoster, Live 07/09/2005, 01/24/2006   Pertinent  Health Maintenance Due  Topic Date Due  . INFLUENZA VACCINE  02/07/2023  . DEXA SCAN  Completed  . MAMMOGRAM  Discontinued  . Colonoscopy  Discontinued      06/07/2021    3:49 PM 08/15/2021    4:50 PM 03/05/2022    2:03 PM 10/31/2022    2:08 PM 01/11/2023    2:03 PM  Fall Risk  Falls in the past year? 0 0 1 1 0  Was there an injury with Fall? 0 0 1 0 0  Fall Risk Category Calculator 0 0 2 2 0  Fall Risk Category (Retired) Low Low Moderate    (RETIRED) Patient Fall Risk Level Low fall risk Low fall risk Moderate fall risk    Patient at Risk for Falls Due to No Fall Risks No Fall Risks History of fall(s);Impaired balance/gait History of fall(s);Impaired balance/gait;Impaired mobility History of fall(s)  Fall risk Follow up Falls evaluation completed Falls evaluation completed Falls evaluation completed;Education provided;Falls prevention discussed  Falls evaluation completed   Functional Status Survey:    Vitals:   01/11/23 1356  BP: 126/82  Pulse: 88  SpO2: 95%  Weight: 110 lb 9.6 oz (50.2 kg)  Height: 5\' 2"  (1.575 m)   Body mass index is 20.23 kg/m. Physical  Exam Vitals reviewed.  Constitutional:      General: She is not in acute distress.    Appearance: Normal appearance. She is normal weight. She is not ill-appearing or diaphoretic.  HENT:     Head: Normocephalic.  Eyes:     General: No scleral icterus.       Right eye: No discharge.        Left eye: No discharge.     Conjunctiva/sclera: Conjunctivae normal.     Pupils: Pupils are equal, round, and reactive to light.  Neck:     Vascular: No carotid bruit.  Cardiovascular:     Rate and Rhythm: Normal rate and regular rhythm.     Pulses: Normal pulses.     Heart sounds: Normal heart sounds. No murmur heard.    No friction rub. No gallop.  Pulmonary:     Effort: Pulmonary effort is normal. No respiratory distress.     Breath sounds: Normal breath sounds. No  wheezing, rhonchi or rales.  Chest:     Chest wall: No tenderness.  Abdominal:     General: Bowel sounds are normal. There is no distension.     Palpations: Abdomen is soft. There is no mass.     Tenderness: There is no abdominal tenderness. There is no right CVA tenderness, left CVA tenderness, guarding or rebound.  Musculoskeletal:        General: No swelling or tenderness. Normal range of motion.     Cervical back: Normal range of motion. No rigidity or tenderness.     Right lower leg: No edema.     Left lower leg: No edema.     Comments: On wheelchair during visit   Lymphadenopathy:     Cervical: No cervical adenopathy.  Skin:    General: Skin is warm and dry.     Coloration: Skin is not pale.     Findings: No bruising, erythema, lesion or rash.  Neurological:     Mental Status: She is alert and oriented to person, place, and time.     Cranial Nerves: No cranial nerve deficit.     Sensory: No sensory deficit.     Motor: No weakness.     Coordination: Coordination normal.     Gait: Gait abnormal.  Psychiatric:        Mood and Affect: Mood normal.        Speech: Speech normal.        Behavior: Behavior normal.        Thought Content: Thought content normal.        Judgment: Judgment normal.    Labs reviewed: Recent Labs    02/20/22 1345 09/03/22 0000 01/08/23 1638  NA 140 141 141  K 4.8 4.5 4.5  CL 103 103 108  CO2 27 30 23   GLUCOSE 80 80 75  BUN 17 17 10   CREATININE 1.27* 1.26* 1.07*  CALCIUM 9.0 9.1 7.8*   Recent Labs    09/03/22 0000 01/08/23 1638  AST 26 23  ALT 21 17  ALKPHOS  --  62  BILITOT 0.5 0.9  PROT 6.6 5.5*  ALBUMIN  --  2.6*   Recent Labs    02/20/22 1345 09/03/22 0000 01/08/23 1638  WBC 10.3 9.8 7.7  NEUTROABS 7,818* 7,477 5.3  HGB 13.9 14.0 12.8  HCT 42.0 42.6 40.0  MCV 91.3 90.8 94.6  PLT 297 296 246   Lab Results  Component Value Date   TSH 0.86 08/16/2021   Lab Results  Component Value Date   HGBA1C 4.9 04/05/2020    Lab Results  Component Value Date   CHOL 166 09/03/2022   HDL 58 09/03/2022   LDLCALC 80 09/03/2022   LDLDIRECT 124.8 09/21/2011   TRIG 185 (H) 09/03/2022   CHOLHDL 2.9 09/03/2022    Significant Diagnostic Results in last 30 days:  CT Angio Chest PE W and/or Wo Contrast  Result Date: 01/08/2023 CLINICAL DATA:  Elevated D-dimer levels.  Chest pain. EXAM: CT ANGIOGRAPHY CHEST WITH CONTRAST TECHNIQUE: Multidetector CT imaging of the chest was performed using the standard protocol during bolus administration of intravenous contrast. Multiplanar CT image reconstructions and MIPs were obtained to evaluate the vascular anatomy. RADIATION DOSE REDUCTION: This exam was performed according to the departmental dose-optimization program which includes automated exposure control, adjustment of the mA and/or kV according to patient size and/or use of iterative reconstruction technique. CONTRAST:  75mL OMNIPAQUE IOHEXOL 350 MG/ML SOLN COMPARISON:  07/04/2022 FINDINGS: Cardiovascular: No filling defect is identified in the pulmonary arterial tree to suggest pulmonary embolus. Coronary, aortic arch, and branch vessel atherosclerotic vascular disease. Aortic valve calcifications and mild mitral valvular calcifications. Mediastinum/Nodes: Unremarkable Lungs/Pleura: Mild biapical pleuroparenchymal scarring, right greater than left. Centrilobular emphysema. Mild atelectasis in the posterior basal segment left lower lobe. Stable 3 by 4 mm right upper lobe nodule on image 33 series 7. 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 There is also atheromatous plaque narrowing the proximal celiac trunk. No overt occlusion of either vessel is identified. Musculoskeletal: Old healed right rib fractures. Degenerative sternoclavicular arthropathy, right greater than left. Old compression fractures at T5, T7, and T9. New (compared to 07/04/2022) anterior  and middle column fracture of the T8 vertebral body with visible fracture plane below the superior endplate on image 90 of series 9, and only about 2 mm of posterior bony retropulsion. Currently there is 20% loss of vertebral body height. New compression fracture involving the anterior middle columns at T10, with only 1-2 mm of posterior bony retropulsion. Currently there is nearly 50% loss of vertebral body height. The multiple thoracic compression fractures imply osteoporosis. Review of the MIP images confirms the above findings. IMPRESSION: 1. No filling defect is identified in the pulmonary arterial tree to suggest pulmonary embolus. 2. New compression fractures at T8 and T10 compared to 07/04/2022. These involve the anterior middle columns, but have only 1-2 mm of posterior bony retropulsion. 3. Stable 3 by 4 mm right upper lobe nodule. Chest CT surveillance as per prior lung cancer screening recommendations issued on 07/04/2022 are reiterated. 4. Aortic valve calcifications and mild mitral valvular calcifications. 5. 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 There is also atheromatous plaque narrowing the proximal celiac trunk. No overt occlusion of either vessel is identified. 6. Old healed right rib fractures. 7. Degenerative sternoclavicular arthropathy, right greater than left. 8. Multiple thoracic compression fractures imply osteoporosis. Aortic Atherosclerosis (ICD10-I70.0) and Emphysema (ICD10-J43.9). Electronically Signed   By: Gaylyn Rong M.D.   On: 01/08/2023 20:03   DG Chest Port 1 View  Result Date: 01/08/2023 CLINICAL DATA:  chest pain EXAM: PORTABLE CHEST 1 VIEW COMPARISON:  None Available. FINDINGS: No pleural effusion. No pneumothorax. No focal airspace opacity. Normal cardiac and mediastinal contours. No radiographically apparent displaced rib fractures. Visualized upper abdomen is unremarkable. IMPRESSION: No focal  airspace opacity. Electronically Signed   By: Lorenza Cambridge M.D.   On: 01/08/2023 17:42  Assessment/Plan There are no diagnoses linked to this encounter.   Family/ staff Communication: Reviewed plan of care with patient  Labs/tests ordered: None   Next Appointment:   Caesar Bookman, NP

## 2023-01-12 LAB — CBC WITH DIFFERENTIAL/PLATELET
Basophils Absolute: 91 cells/uL (ref 0–200)
Basophils Relative: 1.4 %
Eosinophils Absolute: 202 cells/uL (ref 15–500)
Eosinophils Relative: 3.1 %
HCT: 41.2 % (ref 35.0–45.0)
Lymphs Abs: 2015 cells/uL (ref 850–3900)
MCH: 29.5 pg (ref 27.0–33.0)
MCHC: 32 g/dL (ref 32.0–36.0)
MCV: 92 fL (ref 80.0–100.0)
MPV: 10.6 fL (ref 7.5–12.5)
Monocytes Relative: 7.6 %
Neutro Abs: 3699 cells/uL (ref 1500–7800)
Neutrophils Relative %: 56.9 %
Platelets: 315 10*3/uL (ref 140–400)
RDW: 12.3 % (ref 11.0–15.0)

## 2023-01-12 LAB — COMPLETE METABOLIC PANEL WITH GFR
AG Ratio: 1.3 (calc) (ref 1.0–2.5)
ALT: 22 U/L (ref 6–29)
AST: 27 U/L (ref 10–35)
Albumin: 3.6 g/dL (ref 3.6–5.1)
Alkaline phosphatase (APISO): 84 U/L (ref 37–153)
BUN/Creatinine Ratio: 12 (calc) (ref 6–22)
BUN: 14 mg/dL (ref 7–25)
CO2: 27 mmol/L (ref 20–32)
Calcium: 8.8 mg/dL (ref 8.6–10.4)
Chloride: 105 mmol/L (ref 98–110)
Creat: 1.15 mg/dL — ABNORMAL HIGH (ref 0.60–1.00)
Globulin: 2.7 g/dL (calc) (ref 1.9–3.7)
Glucose, Bld: 80 mg/dL (ref 65–99)
Potassium: 4.5 mmol/L (ref 3.5–5.3)
Sodium: 140 mmol/L (ref 135–146)
Total Bilirubin: 0.4 mg/dL (ref 0.2–1.2)
Total Protein: 6.3 g/dL (ref 6.1–8.1)
eGFR: 48 mL/min/{1.73_m2} — ABNORMAL LOW (ref >=60)

## 2023-01-12 LAB — D-DIMER, QUANTITATIVE: D-Dimer, Quant: 0.84 mcg/mL FEU — ABNORMAL HIGH (ref <0.50)

## 2023-01-30 ENCOUNTER — Telehealth: Payer: Self-pay

## 2023-01-30 NOTE — Telephone Encounter (Signed)
Paper work signed and place on outgoing fax box.May collect urine specimen for U/A and C/S to r/o UTI

## 2023-01-30 NOTE — Telephone Encounter (Signed)
Noted  

## 2023-01-30 NOTE — Telephone Encounter (Signed)
Incoming call received from [facility] staff to check the status of paperwork faxed x 2 days ago requesting orders. Confirmation of paperwork provided to caller as fax remains in Advance folder on desk to review and give to administrative staff for further processing.  I have now moved paperwork to time sensitive folder

## 2023-01-31 DIAGNOSIS — Z0189 Encounter for other specified special examinations: Secondary | ICD-10-CM | POA: Diagnosis not present

## 2023-02-06 ENCOUNTER — Telehealth: Payer: Self-pay | Admitting: Neurology

## 2023-02-06 NOTE — Telephone Encounter (Signed)
error 

## 2023-02-11 ENCOUNTER — Encounter: Payer: Self-pay | Admitting: Family

## 2023-02-11 ENCOUNTER — Ambulatory Visit (INDEPENDENT_AMBULATORY_CARE_PROVIDER_SITE_OTHER): Payer: Medicare HMO | Admitting: Family

## 2023-02-11 VITALS — BP 128/78 | HR 99 | Temp 97.7°F | Resp 18 | Ht 62.0 in | Wt 106.0 lb

## 2023-02-11 DIAGNOSIS — R451 Restlessness and agitation: Secondary | ICD-10-CM | POA: Diagnosis not present

## 2023-02-11 DIAGNOSIS — R350 Frequency of micturition: Secondary | ICD-10-CM | POA: Diagnosis not present

## 2023-02-11 LAB — CBC WITH DIFFERENTIAL/PLATELET
Absolute Monocytes: 708 cells/uL (ref 200–950)
Basophils Absolute: 73 cells/uL (ref 0–200)
Basophils Relative: 1 %
Eosinophils Absolute: 139 cells/uL (ref 15–500)
Eosinophils Relative: 1.9 %
HCT: 42.8 % (ref 35.0–45.0)
Hemoglobin: 13.6 g/dL (ref 11.7–15.5)
Lymphs Abs: 1504 cells/uL (ref 850–3900)
MCH: 29.1 pg (ref 27.0–33.0)
MCHC: 31.8 g/dL — ABNORMAL LOW (ref 32.0–36.0)
MCV: 91.5 fL (ref 80.0–100.0)
MPV: 10.9 fL (ref 7.5–12.5)
Monocytes Relative: 9.7 %
Neutro Abs: 4876 cells/uL (ref 1500–7800)
Neutrophils Relative %: 66.8 %
Platelets: 309 10*3/uL (ref 140–400)
RBC: 4.68 10*6/uL (ref 3.80–5.10)
RDW: 12.5 % (ref 11.0–15.0)
Total Lymphocyte: 20.6 %
WBC: 7.3 10*3/uL (ref 3.8–10.8)

## 2023-02-11 NOTE — Progress Notes (Signed)
Provider: Richarda Blade FNP-C  , Donalee Citrin, NP  Patient Care Team: , Donalee Citrin, NP as PCP - General (Family Medicine) Chilton Si, MD as PCP - Cardiology (Cardiology)  Extended Emergency Contact Information Primary Emergency Contact: [facility] Address: [address]          [place], [place] [address] Darden Amber of [place] Work Phone: [phone] Mobile Phone: [phone] Relation: Son Secondary Emergency Contact: Priscille Loveless [place] Home Phone: [phone] Relation: Brother  Code Status:  DNR Goals of care: Advanced Directive information    02/11/2023    1:36 PM  Advanced Directives  Does Patient Have a Medical Advance Directive? No  Would patient like information on creating a medical advance directive? No - Patient declined     Chief Complaint  Patient presents with   Acute Visit    Confusion, agitation. Possibly Sundowning.     HPI:  Pt is a 77 y.o. female seen today for an acute visit for evaluation of increased confusion and agitation. Has had progressive weight loss.states sometimes not hungry.Drinks protein supplement once a day.  Sometimes does not wear her oxygen Sats drops. She denies any fever,chills,cough,fatigue,body aches,runny nose,chest tightness,chest pain,palpitation or shortness of breath.    Past Medical History:  Diagnosis Date   Allergic rhinitis    Allergic rhinitis, cause unspecified 09/21/2011   Aortic stenosis 08/23/2016   Arthritis    Asthma    Asthma 09/21/2011   Colon polyps    COPD (chronic obstructive pulmonary disease) (HCC)    mild    Coronary artery calcification 08/23/2016   Dementia (HCC)    Depression with anxiety    HTN (hypertension)    Hyperlipidemia    UTI (lower urinary tract infection)    Past Surgical History:  Procedure Laterality Date   DENTAL SURGERY     pilonidial cyst     TONSILLECTOMY      Allergies  Allergen Reactions   Lipitor  [Atorvastatin] Other (See Comments)    Myalgias    Outpatient Encounter Medications as of 02/11/2023  Medication Sig   acetaminophen (TYLENOL) 325 MG tablet Take 650 mg by mouth every 6 (six) hours as needed for mild pain or headache.   amLODipine (NORVASC) 5 MG tablet TAKE ONE TABLET BY MOUTH DAILY   buPROPion (WELLBUTRIN XL) 150 MG 24 hr tablet TAKE ONE TABLET BY MOUTH DAILY   Cholecalciferol (THERA-D 2000) 50 MCG (2000 UT) TABS 1 tab by mouth once daily   clonazePAM (KLONOPIN) 0.5 MG tablet Take 1 tablet (0.5 mg total) by mouth 2 (two) times daily as needed for anxiety.   donepezil (ARICEPT) 5 MG tablet TAKE ONE TABLET BY MOUTH AT BEDTIME   FLUoxetine (PROZAC) 40 MG capsule TAKE ONE CAPSULE BY MOUTH DAILY   fluticasone (FLONASE) 50 MCG/ACT nasal spray USE TWO SPRAYS in each nostril ONCE DAILY.   folic acid (FOLVITE) 1 MG tablet TAKE ONE TABLET BY MOUTH DAILY   ipratropium (ATROVENT) 0.02 % nebulizer solution NEBULIZE CONTENTS OF 1 VIAL 3 TIMES A DAY   levalbuterol (XOPENEX) 0.63 MG/3ML nebulizer solution USE ONE vial via nebulization every SIX hours as needed FOR SHORTNESS OF BREATH OR wheezing.   melatonin 5 MG TABS Take 5 mg by mouth at bedtime.   memantine (NAMENDA) 10 MG tablet TAKE ONE TABLET BY MOUTH TWICE DAILY   mirtazapine (REMERON) 15 MG tablet TAKE ONE TABLET BY MOUTH AT BEDTIME   montelukast (SINGULAIR) 10 MG tablet TAKE ONE TABLET BY MOUTH  DAILY   Multiple Vitamin (MULTIVITAMIN WITH MINERALS) TABS tablet Take 1 tablet by mouth daily.   Nutritional Supplements (ENSURE ACTIVE HIGH PROTEIN) LIQD Take 1 Can by mouth daily.   omeprazole (PRILOSEC) 20 MG capsule Take 2 capsules (40 mg total) by mouth daily as needed.   Respiratory Therapy Supplies (FLUTTER) DEVI Use as directed   rosuvastatin (CRESTOR) 20 MG tablet TAKE ONE TABLET BY MOUTH DAILY   thiamine 100 MG tablet Take 1 tablet (100 mg total) by mouth daily.   TRELEGY ELLIPTA 200-62.5-25 MCG/ACT AEPB INHALE 1 PUFF INTO THE  LUNGS ONCE DAILY AS DIRECTED.   vitamin B-12 (CYANOCOBALAMIN) 1000 MCG tablet Take 1 tablet (1,000 mcg total) by mouth daily.   No facility-administered encounter medications on file as of 02/11/2023.    Review of Systems  Constitutional:  Negative for appetite change, chills, fatigue, fever and unexpected weight change.  HENT:  Negative for congestion, dental problem, ear discharge, ear pain, facial swelling, hearing loss, nosebleeds, postnasal drip, rhinorrhea, sinus pressure, sinus pain, sneezing, sore throat, tinnitus and trouble swallowing.   Eyes:  Negative for pain, discharge, redness, itching and visual disturbance.  Respiratory:  Negative for cough, chest tightness, shortness of breath and wheezing.   Cardiovascular:  Negative for chest pain, palpitations and leg swelling.  Gastrointestinal:  Negative for abdominal distention, abdominal pain, blood in stool, constipation, diarrhea, nausea and vomiting.  Genitourinary:  Negative for difficulty urinating, dysuria, flank pain, frequency and urgency.  Musculoskeletal:  Positive for gait problem. Negative for arthralgias, back pain, joint swelling, myalgias, neck pain and neck stiffness.  Skin:  Negative for color change, pallor, rash and wound.  Neurological:  Negative for dizziness, syncope, speech difficulty, weakness, light-headedness, numbness and headaches.  Hematological:  Does not bruise/bleed easily.  Psychiatric/Behavioral:  Positive for agitation and confusion. Negative for behavioral problems, hallucinations, self-injury, sleep disturbance and suicidal ideas. The patient is not nervous/anxious.     Immunization History  Administered Date(s) Administered   Fluad Quad(high Dose 65+) 04/15/2019, 04/05/2020, 08/16/2021   Influenza Split 04/11/2011   Influenza Whole 03/31/2008   Influenza, High Dose Seasonal PF 04/29/2013, 03/18/2018   Influenza,inj,Quad PF,6+ Mos 05/07/2014   Influenza-Unspecified 05/13/2015, 04/19/2017    PFIZER(Purple Top)SARS-COV-2 Vaccination 08/01/2019, 08/23/2019, 04/25/2020   Pneumococcal Conjugate-13 05/13/2013   Pneumococcal Polysaccharide-23 03/31/2008, 08/19/2017   Td 02/10/2008   Tdap 12/18/2015, 09/22/2020, 02/26/2021   Zoster Recombinant(Shingrix) 01/25/2017   Zoster, Live 07/09/2005, 01/24/2006   Pertinent  Health Maintenance Due  Topic Date Due   INFLUENZA VACCINE  02/07/2023   DEXA SCAN  Completed   MAMMOGRAM  Discontinued   Colonoscopy  Discontinued      08/15/2021    4:50 PM 03/05/2022    2:03 PM 10/31/2022    2:08 PM 01/11/2023    2:03 PM 02/11/2023    3:57 PM  Fall Risk  Falls in the past year? 0 1 1 0 0  Was there an injury with Fall? 0 1 0 0 0  Fall Risk Category Calculator 0 2 2 0 0  Fall Risk Category (Retired) Low Moderate     (RETIRED) Patient Fall Risk Level Low fall risk Moderate fall risk     Patient at Risk for Falls Due to No Fall Risks History of fall(s);Impaired balance/gait History of fall(s);Impaired balance/gait;Impaired mobility History of fall(s) History of fall(s)  Fall risk Follow up Falls evaluation completed Falls evaluation completed;Education provided;Falls prevention discussed  Falls evaluation completed Falls evaluation completed   Functional Status Survey:  Vitals:   02/11/23 1551  BP: 128/78  Pulse: 99  Resp: 18  Temp: 97.7 F (36.5 C)  SpO2: 90%  Weight: 106 lb (48.1 kg)  Height: 5\' 2"  (1.575 m)   Body mass index is 19.39 kg/m. Physical Exam Vitals reviewed.  Constitutional:      General: She is not in acute distress.    Appearance: Normal appearance. She is normal weight. She is not ill-appearing or diaphoretic.  HENT:     Head: Normocephalic.     Right Ear: Tympanic membrane, ear canal and external ear normal. There is no impacted cerumen.     Left Ear: Tympanic membrane, ear canal and external ear normal. There is no impacted cerumen.     Nose: Nose normal. No congestion or rhinorrhea.     Mouth/Throat:     Mouth:  Mucous membranes are moist.     Pharynx: Oropharynx is clear. No oropharyngeal exudate or posterior oropharyngeal erythema.  Eyes:     General: No scleral icterus.       Right eye: No discharge.        Left eye: No discharge.     Extraocular Movements: Extraocular movements intact.     Conjunctiva/sclera: Conjunctivae normal.     Pupils: Pupils are equal, round, and reactive to light.  Neck:     Vascular: No carotid bruit.  Cardiovascular:     Rate and Rhythm: Normal rate and regular rhythm.     Pulses: Normal pulses.     Heart sounds: Normal heart sounds. No murmur heard.    No friction rub. No gallop.  Pulmonary:     Effort: Pulmonary effort is normal. No respiratory distress.     Breath sounds: Normal breath sounds. No wheezing, rhonchi or rales.  Chest:     Chest wall: No tenderness.  Abdominal:     General: Bowel sounds are normal. There is no distension.     Palpations: Abdomen is soft. There is no mass.     Tenderness: There is no abdominal tenderness. There is no right CVA tenderness, left CVA tenderness, guarding or rebound.  Musculoskeletal:        General: No swelling or tenderness. Normal range of motion.     Cervical back: Normal range of motion. No rigidity or tenderness.     Right lower leg: No edema.     Left lower leg: No edema.  Lymphadenopathy:     Cervical: No cervical adenopathy.  Skin:    General: Skin is warm and dry.     Coloration: Skin is not pale.     Findings: No bruising, erythema, lesion or rash.  Neurological:     Mental Status: She is alert and oriented to person, place, and time.     Cranial Nerves: No cranial nerve deficit.     Sensory: No sensory deficit.     Motor: No weakness.     Coordination: Coordination normal.     Gait: Gait abnormal.  Psychiatric:        Mood and Affect: Mood normal.        Speech: Speech normal.        Behavior: Behavior normal.        Thought Content: Thought content normal.        Cognition and Memory:  Memory is impaired.     Labs reviewed: Recent Labs    09/03/22 0000 01/08/23 1638 01/11/23 1442  NA 141 141 140  K 4.5 4.5 4.5  CL 103  108 105  CO2 30 23 27   GLUCOSE 80 75 80  BUN 17 10 14   CREATININE 1.26* 1.07* 1.15*  CALCIUM 9.1 7.8* 8.8   Recent Labs    09/03/22 0000 01/08/23 1638 01/11/23 1442  AST 26 23 27   ALT 21 17 22   ALKPHOS  --  62  --   BILITOT 0.5 0.9 0.4  PROT 6.6 5.5* 6.3  ALBUMIN  --  2.6*  --    Recent Labs    09/03/22 0000 01/08/23 1638 01/11/23 1442  WBC 9.8 7.7 6.5  NEUTROABS 7,477 5.3 3,699  HGB 14.0 12.8 13.2  HCT 42.6 40.0 41.2  MCV 90.8 94.6 92.0  PLT 296 246 315   Lab Results  Component Value Date   TSH 0.86 08/16/2021   Lab Results  Component Value Date   HGBA1C 4.9 04/05/2020   Lab Results  Component Value Date   CHOL 166 09/03/2022   HDL 58 09/03/2022   LDLCALC 80 09/03/2022   LDLDIRECT 124.8 09/21/2011   TRIG 185 (H) 09/03/2022   CHOLHDL 2.9 09/03/2022    Significant Diagnostic Results in last 30 days:  No results found.  Assessment/Plan  Agitation VSS Possible worsening memory decline but will rule out other acute abnormalities. - CBC with Differential/Platelet - COMPLETE METABOLIC PANEL WITH GFR - Urine Culture  Family/ staff Communication: Reviewed plan of care with patient verbalized understanding   Labs/tests ordered:  - CBC with Differential/Platelet - COMPLETE METABOLIC PANEL WITH GFR - Urine Culture  Next Appointment: Return if symptoms worsen or fail to improve.   Caesar Bookman, NP

## 2023-02-14 ENCOUNTER — Telehealth: Payer: Self-pay | Admitting: Family

## 2023-02-14 NOTE — Telephone Encounter (Signed)
Weight has stabilize.continue to monitor weight per facility protocol.

## 2023-03-25 ENCOUNTER — Ambulatory Visit: Payer: Medicare HMO | Admitting: Neurology

## 2023-04-08 ENCOUNTER — Other Ambulatory Visit: Payer: Self-pay | Admitting: *Deleted

## 2023-04-08 DIAGNOSIS — F1027 Alcohol dependence with alcohol-induced persisting dementia: Secondary | ICD-10-CM

## 2023-04-08 MED ORDER — CLONAZEPAM 0.5 MG PO TABS
0.5000 mg | ORAL_TABLET | Freq: Two times a day (BID) | ORAL | 5 refills | Status: DC | PRN
Start: 2023-04-08 — End: 2023-05-14

## 2023-04-08 NOTE — Telephone Encounter (Signed)
[facility] requested refill.  Pended Rx and sent to [facility] for approval.

## 2023-04-23 ENCOUNTER — Telehealth: Payer: Self-pay

## 2023-04-23 DIAGNOSIS — F418 Other specified anxiety disorders: Secondary | ICD-10-CM

## 2023-04-23 DIAGNOSIS — R451 Restlessness and agitation: Secondary | ICD-10-CM

## 2023-04-23 MED ORDER — CLONAZEPAM 0.5 MG PO TABS
0.5000 mg | ORAL_TABLET | Freq: Every day | ORAL | 3 refills | Status: DC
Start: 2023-04-23 — End: 2023-05-14

## 2023-04-23 NOTE — Telephone Encounter (Signed)
Carilyn Goodpasture gave me a fax from [facility] asking that I inquire about why we received refill request for clonazepam 0.5 mg tablet, take 1 by mouth every morning controlled when rx was approved on 04/08/23 for clonazepam 0.5 mg tablet 0.5 mg take two times daily as needed # 60/5 refills   I called [facility], spoke with pharmacist who explained that they do have the rx on file for the PRN dosing approved on 04/08/2023, however they do not have any more refills on the scheduled morning dosing and that is why fax was sent.   Per the pharmacist a written order about patient having agitation and needing a scheduled morning dose along with the PRN (0.5 mg two times daily as needed) was signed by Carilyn Goodpasture on 03/04/2023, that rx does not have any refills (rx is not on active medication list)  Please advise

## 2023-04-23 NOTE — Telephone Encounter (Signed)
Will send prescription electronically.

## 2023-05-01 ENCOUNTER — Encounter: Payer: Self-pay | Admitting: Family

## 2023-05-01 ENCOUNTER — Ambulatory Visit (INDEPENDENT_AMBULATORY_CARE_PROVIDER_SITE_OTHER): Payer: Medicare HMO | Admitting: Family

## 2023-05-01 VITALS — BP 118/68 | HR 68 | Temp 97.7°F | Resp 20 | Ht 62.0 in | Wt 116.8 lb

## 2023-05-01 DIAGNOSIS — N1831 Chronic kidney disease, stage 3a: Secondary | ICD-10-CM

## 2023-05-01 DIAGNOSIS — F5101 Primary insomnia: Secondary | ICD-10-CM

## 2023-05-01 DIAGNOSIS — I10 Essential (primary) hypertension: Secondary | ICD-10-CM

## 2023-05-01 NOTE — Progress Notes (Signed)
Provider: Richarda Blade FNP-C  Yulian Gosney, Donalee Citrin, NP  Patient Care Team: Jaymian Bogart, Donalee Citrin, NP as PCP - General (Family Medicine) Chilton Si, MD as PCP - Cardiology (Cardiology)  Extended Emergency Contact Information Primary Emergency Contact: [facility] Address: [address]          [place], [place] [address] Darden Amber of [place] Work Phone: [phone] Mobile Phone: [phone] Relation: Son  Code Status:  DNR Goals of care: Advanced Directive information    02/11/2023    1:36 PM  Advanced Directives  Does Patient Have a Medical Advance Directive? No  Would patient like information on creating a medical advance directive? No - Patient declined     Chief Complaint  Patient presents with   Medical Management of Chronic Issues    Patient presents today for a 2 month follow-up   Quality Metric Gaps    AWV, zoster, lung cancer screening    HPI:  Pt is a 77 y.o. female seen today for medical management of chronic issues.  She denies any acute issues.  No recent hospitalization or fall episodes Has gained 10 pounds weight over 2 months.  States appetite has been good. Drinks Ensure supplements twice daily   Past Medical History:  Diagnosis Date   Allergic rhinitis    Allergic rhinitis, cause unspecified 09/21/2011   Aortic stenosis 08/23/2016   Arthritis    Asthma    Asthma 09/21/2011   Colon polyps    COPD (chronic obstructive pulmonary disease) (HCC)    mild    Coronary artery calcification 08/23/2016   Dementia (HCC)    Depression with anxiety    HTN (hypertension)    Hyperlipidemia    UTI (lower urinary tract infection)    Past Surgical History:  Procedure Laterality Date   DENTAL SURGERY     pilonidial cyst     TONSILLECTOMY      Allergies  Allergen Reactions   Lipitor [Atorvastatin] Other (See Comments)    Myalgias    Outpatient Encounter Medications as of 05/01/2023  Medication Sig   acetaminophen (TYLENOL) 325 MG  tablet Take 650 mg by mouth every 6 (six) hours as needed for mild pain or headache.   amLODipine (NORVASC) 5 MG tablet TAKE ONE TABLET BY MOUTH DAILY   buPROPion (WELLBUTRIN XL) 150 MG 24 hr tablet TAKE ONE TABLET BY MOUTH DAILY   Cholecalciferol (THERA-D 2000) 50 MCG (2000 UT) TABS 1 tab by mouth once daily   clonazePAM (KLONOPIN) 0.5 MG tablet Take 1 tablet (0.5 mg total) by mouth 2 (two) times daily as needed for anxiety.   clonazePAM (KLONOPIN) 0.5 MG tablet Take 1 tablet (0.5 mg total) by mouth daily. In the morning   donepezil (ARICEPT) 5 MG tablet TAKE ONE TABLET BY MOUTH AT BEDTIME   FLUoxetine (PROZAC) 40 MG capsule TAKE ONE CAPSULE BY MOUTH DAILY   fluticasone (FLONASE) 50 MCG/ACT nasal spray USE TWO SPRAYS in each nostril ONCE DAILY.   folic acid (FOLVITE) 1 MG tablet TAKE ONE TABLET BY MOUTH DAILY   ipratropium (ATROVENT) 0.02 % nebulizer solution NEBULIZE CONTENTS OF 1 VIAL 3 TIMES A DAY   levalbuterol (XOPENEX) 0.63 MG/3ML nebulizer solution USE ONE vial via nebulization every SIX hours as needed FOR SHORTNESS OF BREATH OR wheezing.   melatonin 5 MG TABS Take 5 mg by mouth at bedtime.   memantine (NAMENDA) 10 MG tablet TAKE ONE TABLET BY MOUTH TWICE DAILY   mirtazapine (REMERON) 15 MG tablet TAKE ONE TABLET  BY MOUTH AT BEDTIME   montelukast (SINGULAIR) 10 MG tablet TAKE ONE TABLET BY MOUTH DAILY   Multiple Vitamin (MULTIVITAMIN WITH MINERALS) TABS tablet Take 1 tablet by mouth daily.   Nutritional Supplements (ENSURE ACTIVE HIGH PROTEIN) LIQD Take 1 Can by mouth daily.   omeprazole (PRILOSEC) 20 MG capsule Take 2 capsules (40 mg total) by mouth daily as needed.   Respiratory Therapy Supplies (FLUTTER) DEVI Use as directed   rosuvastatin (CRESTOR) 20 MG tablet TAKE ONE TABLET BY MOUTH DAILY   thiamine 100 MG tablet Take 1 tablet (100 mg total) by mouth daily.   TRELEGY ELLIPTA 200-62.5-25 MCG/ACT AEPB INHALE 1 PUFF INTO THE LUNGS ONCE DAILY AS DIRECTED.   vitamin B-12  (CYANOCOBALAMIN) 1000 MCG tablet Take 1 tablet (1,000 mcg total) by mouth daily.   No facility-administered encounter medications on file as of 05/01/2023.    Review of Systems  Constitutional:  Negative for appetite change, chills, fatigue, fever and unexpected weight change.  HENT:  Negative for congestion, dental problem, ear discharge, ear pain, facial swelling, hearing loss, nosebleeds, postnasal drip, rhinorrhea, sinus pressure, sinus pain, sneezing, sore throat, tinnitus and trouble swallowing.   Eyes:  Negative for pain, discharge, redness, itching and visual disturbance.  Respiratory:  Negative for cough, chest tightness, shortness of breath and wheezing.   Cardiovascular:  Negative for chest pain, palpitations and leg swelling.  Gastrointestinal:  Negative for abdominal distention, abdominal pain, blood in stool, constipation, diarrhea, nausea and vomiting.  Endocrine: Negative for cold intolerance, heat intolerance, polydipsia, polyphagia and polyuria.  Genitourinary:  Negative for difficulty urinating, dysuria, flank pain, frequency and urgency.  Musculoskeletal:  Positive for gait problem. Negative for arthralgias, back pain, joint swelling, myalgias, neck pain and neck stiffness.  Skin:  Negative for color change, pallor, rash and wound.  Neurological:  Negative for dizziness, syncope, speech difficulty, weakness, light-headedness, numbness and headaches.  Hematological:  Does not bruise/bleed easily.  Psychiatric/Behavioral:  Negative for agitation, behavioral problems, confusion, hallucinations, self-injury, sleep disturbance and suicidal ideas. The patient is not nervous/anxious.     Immunization History  Administered Date(s) Administered   Fluad Quad(high Dose 65+) 04/15/2019, 04/05/2020, 08/16/2021   Influenza Split 04/11/2011   Influenza Whole 03/31/2008   Influenza, High Dose Seasonal PF 04/29/2013, 03/18/2018, 04/04/2023   Influenza,inj,Quad PF,6+ Mos 05/07/2014    Influenza-Unspecified 05/13/2015, 04/19/2017   PFIZER(Purple Top)SARS-COV-2 Vaccination 08/01/2019, 08/23/2019, 04/25/2020   Pneumococcal Conjugate-13 05/13/2013   Pneumococcal Polysaccharide-23 03/31/2008, 08/19/2017   Td 02/10/2008   Tdap 12/18/2015, 09/22/2020, 02/26/2021   Zoster Recombinant(Shingrix) 01/25/2017   Zoster, Live 07/09/2005, 01/24/2006   Pertinent  Health Maintenance Due  Topic Date Due   INFLUENZA VACCINE  Completed   DEXA SCAN  Completed   MAMMOGRAM  Discontinued   Colonoscopy  Discontinued      03/05/2022    2:03 PM 10/31/2022    2:08 PM 01/11/2023    2:03 PM 02/11/2023    3:57 PM 05/01/2023    2:23 PM  Fall Risk  Falls in the past year? 1 1 0 0 0  Was there an injury with Fall? 1 0 0 0 0  Fall Risk Category Calculator 2 2 0 0 0  Fall Risk Category (Retired) Moderate      (RETIRED) Patient Fall Risk Level Moderate fall risk      Patient at Risk for Falls Due to History of fall(s);Impaired balance/gait History of fall(s);Impaired balance/gait;Impaired mobility History of fall(s) History of fall(s) History of fall(s)  Fall risk  Follow up Falls evaluation completed;Education provided;Falls prevention discussed  Falls evaluation completed Falls evaluation completed Falls evaluation completed   Functional Status Survey:    Vitals:   05/01/23 1407  BP: 118/68  Pulse: 68  Resp: 20  Temp: 97.7 F (36.5 C)  SpO2: 98%  Weight: 116 lb 12.8 oz (53 kg)  Height: 5\' 2"  (1.575 m)   Body mass index is 21.36 kg/m. Physical Exam Vitals reviewed.  Constitutional:      General: She is not in acute distress.    Appearance: Normal appearance. She is normal weight. She is not ill-appearing or diaphoretic.  HENT:     Head: Normocephalic.     Right Ear: Tympanic membrane, ear canal and external ear normal. There is no impacted cerumen.     Left Ear: Tympanic membrane, ear canal and external ear normal. There is no impacted cerumen.     Nose: Nose normal. No congestion or  rhinorrhea.     Mouth/Throat:     Mouth: Mucous membranes are moist.     Pharynx: Oropharynx is clear. No oropharyngeal exudate or posterior oropharyngeal erythema.  Eyes:     General: No scleral icterus.       Right eye: No discharge.        Left eye: No discharge.     Extraocular Movements: Extraocular movements intact.     Conjunctiva/sclera: Conjunctivae normal.     Pupils: Pupils are equal, round, and reactive to light.  Neck:     Vascular: No carotid bruit.  Cardiovascular:     Rate and Rhythm: Normal rate and regular rhythm.     Pulses: Normal pulses.     Heart sounds: Normal heart sounds. No murmur heard.    No friction rub. No gallop.  Pulmonary:     Effort: Pulmonary effort is normal. No respiratory distress.     Breath sounds: Normal breath sounds. No wheezing, rhonchi or rales.  Chest:     Chest wall: No tenderness.  Abdominal:     General: Bowel sounds are normal. There is no distension.     Palpations: Abdomen is soft. There is no mass.     Tenderness: There is no abdominal tenderness. There is no right CVA tenderness, left CVA tenderness, guarding or rebound.  Musculoskeletal:        General: No swelling or tenderness. Normal range of motion.     Cervical back: Normal range of motion. No rigidity or tenderness.     Right lower leg: No edema.     Left lower leg: No edema.  Lymphadenopathy:     Cervical: No cervical adenopathy.  Skin:    General: Skin is warm and dry.     Coloration: Skin is not pale.     Findings: No bruising, erythema, lesion or rash.  Neurological:     Mental Status: She is alert and oriented to person, place, and time.     Cranial Nerves: No cranial nerve deficit.     Sensory: No sensory deficit.     Motor: No weakness.     Coordination: Coordination normal.     Gait: Gait abnormal.  Psychiatric:        Mood and Affect: Mood normal.        Speech: Speech normal.        Behavior: Behavior normal.        Thought Content: Thought content  normal.        Judgment: Judgment normal.     Labs  reviewed: Recent Labs    01/08/23 1638 01/11/23 1442 02/11/23 1626  NA 141 140 141  K 4.5 4.5 4.7  CL 108 105 103  CO2 23 27 31   GLUCOSE 75 80 116*  BUN 10 14 30*  CREATININE 1.07* 1.15* 1.33*  CALCIUM 7.8* 8.8 9.0   Recent Labs    01/08/23 1638 01/11/23 1442 02/11/23 1626  AST 23 27 23   ALT 17 22 21   ALKPHOS 62  --   --   BILITOT 0.9 0.4 0.3  PROT 5.5* 6.3 6.6  ALBUMIN 2.6*  --   --    Recent Labs    01/08/23 1638 01/11/23 1442 02/11/23 1626  WBC 7.7 6.5 7.3  NEUTROABS 5.3 3,699 4,876  HGB 12.8 13.2 13.6  HCT 40.0 41.2 42.8  MCV 94.6 92.0 91.5  PLT 246 315 309   Lab Results  Component Value Date   TSH 0.86 08/16/2021   Lab Results  Component Value Date   HGBA1C 4.9 04/05/2020   Lab Results  Component Value Date   CHOL 166 09/03/2022   HDL 58 09/03/2022   LDLCALC 80 09/03/2022   LDLDIRECT 124.8 09/21/2011   TRIG 185 (H) 09/03/2022   CHOLHDL 2.9 09/03/2022    Significant Diagnostic Results in last 30 days:  No results found.  Assessment/Plan  1. Primary insomnia Continue on Remeron   2. Essential hypertension Blood pressure well-controlled -Continue on amlodipine  3. Stage 3a chronic kidney disease (HCC) CR at baseline  - Will continue to avoid Nephrotoxins and dose all other medication for renal clearance  - Basic metabolic panel  Family/ staff Communication: Reviewed plan of care with patient and son verbalized understanding  Labs/tests ordered:  - Basic metabolic panel  Next Appointment: Return in about 6 months (around 10/30/2023) for medical mangement of chronic issues.Caesar Bookman, NP

## 2023-05-02 LAB — BASIC METABOLIC PANEL
BUN/Creatinine Ratio: 15 (calc) (ref 6–22)
BUN: 20 mg/dL (ref 7–25)
CO2: 32 mmol/L (ref 20–32)
Calcium: 9.3 mg/dL (ref 8.6–10.4)
Chloride: 100 mmol/L (ref 98–110)
Creat: 1.3 mg/dL — ABNORMAL HIGH (ref 0.60–1.00)
Glucose, Bld: 75 mg/dL (ref 65–99)
Potassium: 4.5 mmol/L (ref 3.5–5.3)
Sodium: 140 mmol/L (ref 135–146)

## 2023-05-07 ENCOUNTER — Inpatient Hospital Stay (HOSPITAL_COMMUNITY)
Admission: EM | Admit: 2023-05-07 | Discharge: 2023-05-14 | DRG: 956 | Disposition: A | Discharge status: Other Acute Inpatient Hospital | Payer: Medicare HMO | Source: Skilled Nursing Facility | Attending: Internal Medicine | Admitting: Internal Medicine

## 2023-05-07 ENCOUNTER — Encounter (HOSPITAL_COMMUNITY): Payer: Self-pay | Admitting: Emergency Medicine

## 2023-05-07 ENCOUNTER — Emergency Department (HOSPITAL_COMMUNITY): Payer: Medicare HMO

## 2023-05-07 ENCOUNTER — Other Ambulatory Visit: Payer: Self-pay

## 2023-05-07 DIAGNOSIS — M6281 Muscle weakness (generalized): Secondary | ICD-10-CM | POA: Diagnosis not present

## 2023-05-07 DIAGNOSIS — Z7401 Bed confinement status: Secondary | ICD-10-CM | POA: Diagnosis not present

## 2023-05-07 DIAGNOSIS — R918 Other nonspecific abnormal finding of lung field: Secondary | ICD-10-CM | POA: Diagnosis not present

## 2023-05-07 DIAGNOSIS — E538 Deficiency of other specified B group vitamins: Secondary | ICD-10-CM | POA: Diagnosis present

## 2023-05-07 DIAGNOSIS — F0394 Unspecified dementia, unspecified severity, with anxiety: Secondary | ICD-10-CM | POA: Diagnosis present

## 2023-05-07 DIAGNOSIS — F411 Generalized anxiety disorder: Secondary | ICD-10-CM | POA: Diagnosis present

## 2023-05-07 DIAGNOSIS — Z96641 Presence of right artificial hip joint: Secondary | ICD-10-CM | POA: Diagnosis not present

## 2023-05-07 DIAGNOSIS — S32511D Fracture of superior rim of right pubis, subsequent encounter for fracture with routine healing: Secondary | ICD-10-CM | POA: Diagnosis not present

## 2023-05-07 DIAGNOSIS — I129 Hypertensive chronic kidney disease with stage 1 through stage 4 chronic kidney disease, or unspecified chronic kidney disease: Secondary | ICD-10-CM | POA: Diagnosis present

## 2023-05-07 DIAGNOSIS — W19XXXA Unspecified fall, initial encounter: Secondary | ICD-10-CM | POA: Diagnosis not present

## 2023-05-07 DIAGNOSIS — N179 Acute kidney failure, unspecified: Secondary | ICD-10-CM | POA: Diagnosis not present

## 2023-05-07 DIAGNOSIS — F32A Depression, unspecified: Secondary | ICD-10-CM | POA: Diagnosis present

## 2023-05-07 DIAGNOSIS — R062 Wheezing: Secondary | ICD-10-CM | POA: Diagnosis not present

## 2023-05-07 DIAGNOSIS — N1832 Chronic kidney disease, stage 3b: Secondary | ICD-10-CM | POA: Diagnosis present

## 2023-05-07 DIAGNOSIS — Z7951 Long term (current) use of inhaled steroids: Secondary | ICD-10-CM

## 2023-05-07 DIAGNOSIS — J4489 Other specified chronic obstructive pulmonary disease: Secondary | ICD-10-CM | POA: Diagnosis present

## 2023-05-07 DIAGNOSIS — M5126 Other intervertebral disc displacement, lumbar region: Secondary | ICD-10-CM | POA: Diagnosis not present

## 2023-05-07 DIAGNOSIS — F1027 Alcohol dependence with alcohol-induced persisting dementia: Secondary | ICD-10-CM

## 2023-05-07 DIAGNOSIS — Z9981 Dependence on supplemental oxygen: Secondary | ICD-10-CM

## 2023-05-07 DIAGNOSIS — J449 Chronic obstructive pulmonary disease, unspecified: Secondary | ICD-10-CM | POA: Diagnosis not present

## 2023-05-07 DIAGNOSIS — K5730 Diverticulosis of large intestine without perforation or abscess without bleeding: Secondary | ICD-10-CM | POA: Diagnosis not present

## 2023-05-07 DIAGNOSIS — Y92129 Unspecified place in nursing home as the place of occurrence of the external cause: Secondary | ICD-10-CM | POA: Diagnosis not present

## 2023-05-07 DIAGNOSIS — R278 Other lack of coordination: Secondary | ICD-10-CM | POA: Diagnosis not present

## 2023-05-07 DIAGNOSIS — N1831 Chronic kidney disease, stage 3a: Secondary | ICD-10-CM | POA: Diagnosis present

## 2023-05-07 DIAGNOSIS — S72041D Displaced fracture of base of neck of right femur, subsequent encounter for closed fracture with routine healing: Secondary | ICD-10-CM | POA: Diagnosis not present

## 2023-05-07 DIAGNOSIS — S32592A Other specified fracture of left pubis, initial encounter for closed fracture: Secondary | ICD-10-CM | POA: Diagnosis not present

## 2023-05-07 DIAGNOSIS — R2689 Other abnormalities of gait and mobility: Secondary | ICD-10-CM | POA: Diagnosis not present

## 2023-05-07 DIAGNOSIS — I700 Atherosclerosis of aorta: Secondary | ICD-10-CM | POA: Diagnosis present

## 2023-05-07 DIAGNOSIS — Z823 Family history of stroke: Secondary | ICD-10-CM

## 2023-05-07 DIAGNOSIS — Z8249 Family history of ischemic heart disease and other diseases of the circulatory system: Secondary | ICD-10-CM

## 2023-05-07 DIAGNOSIS — R531 Weakness: Secondary | ICD-10-CM | POA: Diagnosis not present

## 2023-05-07 DIAGNOSIS — M25552 Pain in left hip: Secondary | ICD-10-CM | POA: Diagnosis not present

## 2023-05-07 DIAGNOSIS — R29890 Loss of height: Secondary | ICD-10-CM | POA: Diagnosis not present

## 2023-05-07 DIAGNOSIS — S72001A Fracture of unspecified part of neck of right femur, initial encounter for closed fracture: Secondary | ICD-10-CM

## 2023-05-07 DIAGNOSIS — S329XXA Fracture of unspecified parts of lumbosacral spine and pelvis, initial encounter for closed fracture: Secondary | ICD-10-CM | POA: Diagnosis present

## 2023-05-07 DIAGNOSIS — J9611 Chronic respiratory failure with hypoxia: Secondary | ICD-10-CM | POA: Diagnosis present

## 2023-05-07 DIAGNOSIS — D62 Acute posthemorrhagic anemia: Secondary | ICD-10-CM | POA: Diagnosis not present

## 2023-05-07 DIAGNOSIS — S3289XA Fracture of other parts of pelvis, initial encounter for closed fracture: Secondary | ICD-10-CM | POA: Diagnosis not present

## 2023-05-07 DIAGNOSIS — R1310 Dysphagia, unspecified: Secondary | ICD-10-CM | POA: Diagnosis not present

## 2023-05-07 DIAGNOSIS — I10 Essential (primary) hypertension: Secondary | ICD-10-CM | POA: Diagnosis not present

## 2023-05-07 DIAGNOSIS — Z87891 Personal history of nicotine dependence: Secondary | ICD-10-CM | POA: Diagnosis not present

## 2023-05-07 DIAGNOSIS — I2510 Atherosclerotic heart disease of native coronary artery without angina pectoris: Secondary | ICD-10-CM | POA: Diagnosis present

## 2023-05-07 DIAGNOSIS — Y92009 Unspecified place in unspecified non-institutional (private) residence as the place of occurrence of the external cause: Secondary | ICD-10-CM

## 2023-05-07 DIAGNOSIS — Z8709 Personal history of other diseases of the respiratory system: Secondary | ICD-10-CM

## 2023-05-07 DIAGNOSIS — Z79899 Other long term (current) drug therapy: Secondary | ICD-10-CM

## 2023-05-07 DIAGNOSIS — S32501A Unspecified fracture of right pubis, initial encounter for closed fracture: Secondary | ICD-10-CM | POA: Diagnosis not present

## 2023-05-07 DIAGNOSIS — S72011A Unspecified intracapsular fracture of right femur, initial encounter for closed fracture: Secondary | ICD-10-CM | POA: Diagnosis present

## 2023-05-07 DIAGNOSIS — F418 Other specified anxiety disorders: Secondary | ICD-10-CM | POA: Diagnosis not present

## 2023-05-07 DIAGNOSIS — M8580 Other specified disorders of bone density and structure, unspecified site: Secondary | ICD-10-CM | POA: Diagnosis not present

## 2023-05-07 DIAGNOSIS — S32511A Fracture of superior rim of right pubis, initial encounter for closed fracture: Secondary | ICD-10-CM | POA: Diagnosis not present

## 2023-05-07 DIAGNOSIS — S22070A Wedge compression fracture of T9-T10 vertebra, initial encounter for closed fracture: Secondary | ICD-10-CM | POA: Diagnosis not present

## 2023-05-07 DIAGNOSIS — Z66 Do not resuscitate: Secondary | ICD-10-CM | POA: Diagnosis present

## 2023-05-07 DIAGNOSIS — S32301A Unspecified fracture of right ilium, initial encounter for closed fracture: Secondary | ICD-10-CM | POA: Diagnosis not present

## 2023-05-07 DIAGNOSIS — E785 Hyperlipidemia, unspecified: Secondary | ICD-10-CM | POA: Diagnosis present

## 2023-05-07 DIAGNOSIS — E860 Dehydration: Secondary | ICD-10-CM | POA: Diagnosis present

## 2023-05-07 DIAGNOSIS — W07XXXA Fall from chair, initial encounter: Secondary | ICD-10-CM | POA: Diagnosis present

## 2023-05-07 DIAGNOSIS — Z888 Allergy status to other drugs, medicaments and biological substances status: Secondary | ICD-10-CM | POA: Diagnosis not present

## 2023-05-07 DIAGNOSIS — M25559 Pain in unspecified hip: Secondary | ICD-10-CM | POA: Diagnosis not present

## 2023-05-07 DIAGNOSIS — S32591A Other specified fracture of right pubis, initial encounter for closed fracture: Secondary | ICD-10-CM

## 2023-05-07 DIAGNOSIS — F0390 Unspecified dementia without behavioral disturbance: Secondary | ICD-10-CM | POA: Diagnosis not present

## 2023-05-07 DIAGNOSIS — N183 Chronic kidney disease, stage 3 unspecified: Secondary | ICD-10-CM | POA: Diagnosis present

## 2023-05-07 DIAGNOSIS — Z471 Aftercare following joint replacement surgery: Secondary | ICD-10-CM | POA: Diagnosis not present

## 2023-05-07 LAB — URINALYSIS, W/ REFLEX TO CULTURE (INFECTION SUSPECTED)
Bilirubin Urine: NEGATIVE
Glucose, UA: NEGATIVE mg/dL
Hgb urine dipstick: NEGATIVE
Ketones, ur: NEGATIVE mg/dL
Leukocytes,Ua: NEGATIVE
Nitrite: NEGATIVE
Protein, ur: NEGATIVE mg/dL
Specific Gravity, Urine: 1.014 (ref 1.005–1.030)
pH: 6 (ref 5.0–8.0)

## 2023-05-07 LAB — COMPREHENSIVE METABOLIC PANEL
ALT: 25 U/L (ref 0–44)
AST: 30 U/L (ref 15–41)
Albumin: 3.3 g/dL — ABNORMAL LOW (ref 3.5–5.0)
Alkaline Phosphatase: 69 U/L (ref 38–126)
Anion gap: 8 (ref 5–15)
BUN: 17 mg/dL (ref 8–23)
CO2: 30 mmol/L (ref 22–32)
Calcium: 9.4 mg/dL (ref 8.9–10.3)
Chloride: 99 mmol/L (ref 98–111)
Creatinine, Ser: 1.1 mg/dL — ABNORMAL HIGH (ref 0.44–1.00)
GFR, Estimated: 51 mL/min — ABNORMAL LOW (ref >60)
Glucose, Bld: 94 mg/dL (ref 70–99)
Potassium: 4.7 mmol/L (ref 3.5–5.1)
Sodium: 137 mmol/L (ref 135–145)
Total Bilirubin: 0.7 mg/dL (ref 0.3–1.2)
Total Protein: 6.9 g/dL (ref 6.5–8.1)

## 2023-05-07 LAB — CBC WITH DIFFERENTIAL/PLATELET
Abs Immature Granulocytes: 0.09 10*3/uL — ABNORMAL HIGH (ref 0.00–0.07)
Basophils Absolute: 0.1 10*3/uL (ref 0.0–0.1)
Basophils Relative: 0 %
Eosinophils Absolute: 0 10*3/uL (ref 0.0–0.5)
Eosinophils Relative: 0 %
HCT: 41.9 % (ref 36.0–46.0)
Hemoglobin: 13.4 g/dL (ref 12.0–15.0)
Immature Granulocytes: 1 %
Lymphocytes Relative: 9 %
Lymphs Abs: 1.3 10*3/uL (ref 0.7–4.0)
MCH: 29.3 pg (ref 26.0–34.0)
MCHC: 32 g/dL (ref 30.0–36.0)
MCV: 91.7 fL (ref 80.0–100.0)
Monocytes Absolute: 1.1 10*3/uL — ABNORMAL HIGH (ref 0.1–1.0)
Monocytes Relative: 7 %
Neutro Abs: 12.4 10*3/uL — ABNORMAL HIGH (ref 1.7–7.7)
Neutrophils Relative %: 83 %
Platelets: 274 10*3/uL (ref 150–400)
RBC: 4.57 MIL/uL (ref 3.87–5.11)
RDW: 13 % (ref 11.5–15.5)
WBC: 14.9 10*3/uL — ABNORMAL HIGH (ref 4.0–10.5)
nRBC: 0 % (ref 0.0–0.2)

## 2023-05-07 MED ORDER — ONDANSETRON HCL 4 MG/2ML IJ SOLN
4.0000 mg | Freq: Once | INTRAMUSCULAR | Status: AC
  Administered 2023-05-07: 4 mg via INTRAVENOUS
  Filled 2023-05-07: qty 2

## 2023-05-07 MED ORDER — MORPHINE SULFATE (PF) 4 MG/ML IV SOLN
4.0000 mg | Freq: Once | INTRAVENOUS | Status: AC
  Administered 2023-05-07: 4 mg via INTRAVENOUS
  Filled 2023-05-07: qty 1

## 2023-05-07 NOTE — ED Triage Notes (Signed)
Patient presents via EMS from [facility] for bilateral hip pain after a fall at approximately 1300 today. Per EMS, the staff at patient's facility initially sat her in a chair after the fall but then patient stated that she couldn't walk so they called 911. Per EMS, patient initially complained of left hip pain but then complained of right hip pain as well with palpation. Per EMS, patient has history of dementia. Per EMS, PMS intact. Pulses marked by EMS.

## 2023-05-07 NOTE — ED Provider Notes (Signed)
[place] [facility] AT [facility] Provider Note   CSN: [id number] Arrival date & time: 05/07/23  1837     History  Chief Complaint  Patient presents with   Hip Pain   Fall    Andrea Larson is a 77 y.o. female.  This is a 77 year old female who presents emergency department today due to pain in her left hip.  Patient has dementia, lives at a skilled nursing facility.  Patient slipped from chair, landed on her butt.  She has been complaining of pain in her right and left hip.  She did not strike her head, is not on any blood thinners.  Patient's daughters at bedside helps provide history.   Hip Pain  Fall       Home Medications Prior to Admission medications   Medication Sig Start Date End Date Taking? Authorizing Provider  acetaminophen (TYLENOL) 325 MG tablet Take 650 mg by mouth every 6 (six) hours as needed for mild pain or headache.    [provider]  amLODipine (NORVASC) 5 MG tablet TAKE ONE TABLET BY MOUTH DAILY 10/29/22   Ngetich, Dinah C, NP  buPROPion (WELLBUTRIN XL) 150 MG 24 hr tablet TAKE ONE TABLET BY MOUTH DAILY 10/29/22   Ngetich, Dinah C, NP  Cholecalciferol (THERA-D 2000) 50 MCG (2000 UT) TABS 1 tab by mouth once daily 12/16/20   Corwin Levins, MD  clonazePAM (KLONOPIN) 0.5 MG tablet Take 1 tablet (0.5 mg total) by mouth 2 (two) times daily as needed for anxiety. 04/08/23   Ngetich, Dinah C, NP  clonazePAM (KLONOPIN) 0.5 MG tablet Take 1 tablet (0.5 mg total) by mouth daily. In the morning 04/23/23   Ngetich, Dinah C, NP  donepezil (ARICEPT) 5 MG tablet TAKE ONE TABLET BY MOUTH AT BEDTIME 10/29/22   Ngetich, Dinah C, NP  FLUoxetine (PROZAC) 40 MG capsule TAKE ONE CAPSULE BY MOUTH DAILY 07/30/22   Ngetich, Dinah C, NP  fluticasone (FLONASE) 50 MCG/ACT nasal spray USE TWO SPRAYS in each nostril ONCE DAILY. 08/14/21   Corwin Levins, MD  folic acid (FOLVITE) 1 MG tablet TAKE ONE TABLET BY MOUTH DAILY 07/30/22   Ngetich, Dinah C, NP   ipratropium (ATROVENT) 0.02 % nebulizer solution NEBULIZE CONTENTS OF 1 VIAL 3 TIMES A DAY 04/16/22   Ngetich, Dinah C, NP  levalbuterol (XOPENEX) 0.63 MG/3ML nebulizer solution USE ONE vial via nebulization every SIX hours as needed FOR SHORTNESS OF BREATH OR wheezing. 06/23/21   Ngetich, Dinah C, NP  melatonin 5 MG TABS Take 5 mg by mouth at bedtime.    [provider]  memantine (NAMENDA) 10 MG tablet TAKE ONE TABLET BY MOUTH TWICE DAILY 07/30/22   Ngetich, Dinah C, NP  mirtazapine (REMERON) 15 MG tablet TAKE ONE TABLET BY MOUTH AT BEDTIME 07/30/22   Ngetich, Dinah C, NP  montelukast (SINGULAIR) 10 MG tablet TAKE ONE TABLET BY MOUTH DAILY 10/29/22   Ngetich, Dinah C, NP  Multiple Vitamin (MULTIVITAMIN WITH MINERALS) TABS tablet Take 1 tablet by mouth daily. 04/12/21   Rodolph Bong, MD  Nutritional Supplements (ENSURE ACTIVE HIGH PROTEIN) LIQD Take 1 Can by mouth daily. 01/11/23   Ngetich, Dinah C, NP  omeprazole (PRILOSEC) 20 MG capsule Take 2 capsules (40 mg total) by mouth daily as needed. 10/31/22   Ngetich, Donalee Citrin, NP  Respiratory Therapy Supplies (FLUTTER) DEVI Use as directed 08/19/17   Mannam, Colbert Coyer, MD  rosuvastatin (CRESTOR) 20 MG tablet TAKE ONE TABLET BY MOUTH  DAILY 10/29/22   Ngetich, Dinah C, NP  thiamine 100 MG tablet Take 1 tablet (100 mg total) by mouth daily. 04/17/21   Ngetich, Dinah C, NP  TRELEGY ELLIPTA 200-62.5-25 MCG/ACT AEPB INHALE 1 PUFF INTO THE LUNGS ONCE DAILY AS DIRECTED. 08/30/22   Ngetich, Dinah C, NP  vitamin B-12 (CYANOCOBALAMIN) 1000 MCG tablet Take 1 tablet (1,000 mcg total) by mouth daily. 04/11/21   Rodolph Bong, MD      Allergies    Lipitor [atorvastatin]    Review of Systems   Review of Systems  Physical Exam Updated Vital Signs BP 114/80   Pulse (!) 106   Temp 97.9 F (36.6 C) (Oral)   Resp 15   Ht 5\' 2"  (1.575 m)   Wt 53 kg   SpO2 94%   BMI 21.36 kg/m  Physical Exam Vitals reviewed.  Constitutional:      Appearance: Normal  appearance.  HENT:     Head: Normocephalic and atraumatic.     Right Ear: Tympanic membrane normal.     Left Ear: Tympanic membrane normal.     Mouth/Throat:     Mouth: Mucous membranes are moist.  Cardiovascular:     Rate and Rhythm: Tachycardia present.     Pulses: Normal pulses.  Pulmonary:     Effort: Pulmonary effort is normal.     Breath sounds: Normal breath sounds.  Abdominal:     General: Abdomen is flat. There is no distension.     Palpations: Abdomen is soft.     Tenderness: There is no abdominal tenderness.  Musculoskeletal:     Comments: Pain with range of motion of the right leg.  Patient with point tenderness on the left hip  Neurological:     Mental Status: She is alert. Mental status is at baseline.     Sensory: No sensory deficit.     ED Results / Procedures / Treatments   Labs (all labs ordered are listed, but only abnormal results are displayed) Labs Reviewed  COMPREHENSIVE METABOLIC PANEL - Abnormal; Notable for the following components:      Result Value   Creatinine, Ser 1.10 (*)    Albumin 3.3 (*)    GFR, Estimated 51 (*)    All other components within normal limits  CBC WITH DIFFERENTIAL/PLATELET - Abnormal; Notable for the following components:   WBC 14.9 (*)    Neutro Abs 12.4 (*)    Monocytes Absolute 1.1 (*)    Abs Immature Granulocytes 0.09 (*)    All other components within normal limits  URINALYSIS, W/ REFLEX TO CULTURE (INFECTION SUSPECTED)    EKG None  Radiology DG Pelvis 1-2 Views  Result Date: 05/07/2023 CLINICAL DATA:  Pain EXAM: PELVIS - 1-2 VIEW COMPARISON:  None Available. FINDINGS: The bones are osteopenic. Right hip is slightly rotated which limits evaluation. There are findings suspicious for nondisplaced fracture of the right superior pubic ramus. There is no dislocation. Soft tissues are within normal limits. IMPRESSION: Findings suspicious for nondisplaced fracture of the right superior pubic ramus. Limited evaluation of  the right hip secondary to rotation. Recommend dedicated hip x-ray for further evaluation if clinically warranted. Electronically Signed   By: Darliss Cheney M.D.   On: 05/07/2023 22:30    Procedures Procedures    Medications Ordered in ED Medications  morphine (PF) 4 MG/ML injection 4 mg (4 mg Intravenous Given 05/07/23 2105)  morphine (PF) 4 MG/ML injection 4 mg (4 mg Intravenous Given 05/07/23 2328)  ondansetron (  ZOFRAN) injection 4 mg (4 mg Intravenous Given 05/07/23 2328)    ED Course/ Medical Decision Making/ A&P                                 Medical Decision Making This is a 77 year old female who presents emergency department with bilateral hip pain, right worse than left.  Differential diagnoses include pelvis fracture, hip fracture, lumbar spine fracture.  Plan-on exam, patient does have reproducible pain in both hips, right seems to be worse than left.  Will obtain imaging of the patient's hips.  Patient also endorses some pain in her lower back.  CT imaging lumbar spine ordered.  Patient without any weakness, numbness or tingling in her legs.  Considered imaging the patient's head, however does not sound as though there was any head strike, patient is at her baseline per daughter who is in the room.  Patient pleasant, conversant.  No neurological deficits appreciated on my exam.  Basic labs ordered.  Did notice patient had a white count, added on urinalysis.  Patient signed out to Dr. Eudelia Bunch pending imaging and disposition.  Amount and/or Complexity of Data Reviewed Labs: ordered. Radiology: ordered.  Risk Prescription drug management.           Final Clinical Impression(s) / ED Diagnoses Final diagnoses:  Fall, initial encounter  Closed fracture of ramus of right pubis, initial encounter [facility])    Rx / DC Orders ED Discharge Orders     None         Anders Simmonds T, DO 05/07/23 2340

## 2023-05-08 ENCOUNTER — Encounter (HOSPITAL_COMMUNITY): Payer: Self-pay | Admitting: Internal Medicine

## 2023-05-08 DIAGNOSIS — S329XXA Fracture of unspecified parts of lumbosacral spine and pelvis, initial encounter for closed fracture: Secondary | ICD-10-CM | POA: Diagnosis present

## 2023-05-08 DIAGNOSIS — F0390 Unspecified dementia without behavioral disturbance: Secondary | ICD-10-CM

## 2023-05-08 DIAGNOSIS — Y92129 Unspecified place in nursing home as the place of occurrence of the external cause: Secondary | ICD-10-CM | POA: Diagnosis not present

## 2023-05-08 DIAGNOSIS — Z79899 Other long term (current) drug therapy: Secondary | ICD-10-CM | POA: Diagnosis not present

## 2023-05-08 DIAGNOSIS — Z9981 Dependence on supplemental oxygen: Secondary | ICD-10-CM | POA: Diagnosis not present

## 2023-05-08 DIAGNOSIS — W19XXXA Unspecified fall, initial encounter: Secondary | ICD-10-CM

## 2023-05-08 DIAGNOSIS — S32501A Unspecified fracture of right pubis, initial encounter for closed fracture: Secondary | ICD-10-CM | POA: Diagnosis not present

## 2023-05-08 DIAGNOSIS — S32592A Other specified fracture of left pubis, initial encounter for closed fracture: Secondary | ICD-10-CM | POA: Diagnosis not present

## 2023-05-08 DIAGNOSIS — E538 Deficiency of other specified B group vitamins: Secondary | ICD-10-CM | POA: Diagnosis present

## 2023-05-08 DIAGNOSIS — S72001A Fracture of unspecified part of neck of right femur, initial encounter for closed fracture: Secondary | ICD-10-CM | POA: Diagnosis not present

## 2023-05-08 DIAGNOSIS — J4489 Other specified chronic obstructive pulmonary disease: Secondary | ICD-10-CM | POA: Diagnosis present

## 2023-05-08 DIAGNOSIS — F411 Generalized anxiety disorder: Secondary | ICD-10-CM | POA: Diagnosis present

## 2023-05-08 DIAGNOSIS — W07XXXA Fall from chair, initial encounter: Secondary | ICD-10-CM | POA: Diagnosis present

## 2023-05-08 DIAGNOSIS — Z66 Do not resuscitate: Secondary | ICD-10-CM | POA: Diagnosis present

## 2023-05-08 DIAGNOSIS — E785 Hyperlipidemia, unspecified: Secondary | ICD-10-CM | POA: Diagnosis present

## 2023-05-08 DIAGNOSIS — S32591A Other specified fracture of right pubis, initial encounter for closed fracture: Secondary | ICD-10-CM

## 2023-05-08 DIAGNOSIS — Z8709 Personal history of other diseases of the respiratory system: Secondary | ICD-10-CM

## 2023-05-08 DIAGNOSIS — Y92009 Unspecified place in unspecified non-institutional (private) residence as the place of occurrence of the external cause: Secondary | ICD-10-CM

## 2023-05-08 DIAGNOSIS — D62 Acute posthemorrhagic anemia: Secondary | ICD-10-CM | POA: Diagnosis not present

## 2023-05-08 DIAGNOSIS — S32301A Unspecified fracture of right ilium, initial encounter for closed fracture: Secondary | ICD-10-CM | POA: Diagnosis not present

## 2023-05-08 DIAGNOSIS — F32A Depression, unspecified: Secondary | ICD-10-CM | POA: Diagnosis present

## 2023-05-08 DIAGNOSIS — I129 Hypertensive chronic kidney disease with stage 1 through stage 4 chronic kidney disease, or unspecified chronic kidney disease: Secondary | ICD-10-CM | POA: Diagnosis present

## 2023-05-08 DIAGNOSIS — Z888 Allergy status to other drugs, medicaments and biological substances status: Secondary | ICD-10-CM | POA: Diagnosis not present

## 2023-05-08 DIAGNOSIS — S72011A Unspecified intracapsular fracture of right femur, initial encounter for closed fracture: Secondary | ICD-10-CM | POA: Diagnosis present

## 2023-05-08 DIAGNOSIS — I2510 Atherosclerotic heart disease of native coronary artery without angina pectoris: Secondary | ICD-10-CM | POA: Diagnosis present

## 2023-05-08 DIAGNOSIS — I700 Atherosclerosis of aorta: Secondary | ICD-10-CM | POA: Diagnosis present

## 2023-05-08 DIAGNOSIS — Z87891 Personal history of nicotine dependence: Secondary | ICD-10-CM | POA: Diagnosis not present

## 2023-05-08 DIAGNOSIS — Z823 Family history of stroke: Secondary | ICD-10-CM | POA: Diagnosis not present

## 2023-05-08 DIAGNOSIS — N179 Acute kidney failure, unspecified: Secondary | ICD-10-CM | POA: Diagnosis not present

## 2023-05-08 DIAGNOSIS — Z7951 Long term (current) use of inhaled steroids: Secondary | ICD-10-CM | POA: Diagnosis not present

## 2023-05-08 DIAGNOSIS — Z8249 Family history of ischemic heart disease and other diseases of the circulatory system: Secondary | ICD-10-CM | POA: Diagnosis not present

## 2023-05-08 DIAGNOSIS — J9611 Chronic respiratory failure with hypoxia: Secondary | ICD-10-CM | POA: Diagnosis present

## 2023-05-08 DIAGNOSIS — N1832 Chronic kidney disease, stage 3b: Secondary | ICD-10-CM | POA: Diagnosis present

## 2023-05-08 DIAGNOSIS — N1831 Chronic kidney disease, stage 3a: Secondary | ICD-10-CM

## 2023-05-08 DIAGNOSIS — F0394 Unspecified dementia, unspecified severity, with anxiety: Secondary | ICD-10-CM | POA: Diagnosis present

## 2023-05-08 DIAGNOSIS — E860 Dehydration: Secondary | ICD-10-CM | POA: Diagnosis present

## 2023-05-08 DIAGNOSIS — I10 Essential (primary) hypertension: Secondary | ICD-10-CM

## 2023-05-08 MED ORDER — ENOXAPARIN SODIUM 30 MG/0.3ML IJ SOSY
30.0000 mg | PREFILLED_SYRINGE | Freq: Once | INTRAMUSCULAR | Status: AC
  Administered 2023-05-08: 30 mg via SUBCUTANEOUS
  Filled 2023-05-08: qty 0.3

## 2023-05-08 MED ORDER — FOLIC ACID 1 MG PO TABS
1.0000 mg | ORAL_TABLET | Freq: Every day | ORAL | Status: DC
  Administered 2023-05-08 – 2023-05-14 (×7): 1 mg via ORAL
  Filled 2023-05-08 (×7): qty 1

## 2023-05-08 MED ORDER — VITAMIN B-12 1000 MCG PO TABS
1000.0000 ug | ORAL_TABLET | Freq: Every day | ORAL | Status: DC
  Administered 2023-05-08 – 2023-05-14 (×7): 1000 ug via ORAL
  Filled 2023-05-08 (×7): qty 1

## 2023-05-08 MED ORDER — HYDROMORPHONE HCL 1 MG/ML IJ SOLN: 0.5000 mg | INTRAMUSCULAR | Status: DC | PRN

## 2023-05-08 MED ORDER — SODIUM CHLORIDE 0.9% FLUSH: 3.0000 mL | INTRAVENOUS | Status: DC | PRN

## 2023-05-08 MED ORDER — ACETAMINOPHEN 325 MG PO TABS
650.0000 mg | ORAL_TABLET | Freq: Four times a day (QID) | ORAL | Status: DC | PRN
  Administered 2023-05-09: 650 mg via ORAL
  Filled 2023-05-08: qty 2

## 2023-05-08 MED ORDER — ONDANSETRON HCL 4 MG PO TABS: 4.0000 mg | ORAL_TABLET | Freq: Four times a day (QID) | ORAL | Status: DC | PRN

## 2023-05-08 MED ORDER — FLUTICASONE PROPIONATE 50 MCG/ACT NA SUSP
1.0000 | Freq: Every day | NASAL | Status: DC
  Administered 2023-05-08 – 2023-05-14 (×7): 1 via NASAL
  Filled 2023-05-08 (×2): qty 16

## 2023-05-08 MED ORDER — BUPROPION HCL ER (XL) 150 MG PO TB24
150.0000 mg | ORAL_TABLET | Freq: Every day | ORAL | Status: DC
  Administered 2023-05-08 – 2023-05-14 (×7): 150 mg via ORAL
  Filled 2023-05-08 (×7): qty 1

## 2023-05-08 MED ORDER — DONEPEZIL HCL 10 MG PO TABS
5.0000 mg | ORAL_TABLET | Freq: Every day | ORAL | Status: DC
  Administered 2023-05-08 – 2023-05-13 (×6): 5 mg via ORAL
  Filled 2023-05-08 (×6): qty 1

## 2023-05-08 MED ORDER — ROSUVASTATIN CALCIUM 20 MG PO TABS
20.0000 mg | ORAL_TABLET | Freq: Every day | ORAL | Status: DC
  Administered 2023-05-08 – 2023-05-14 (×7): 20 mg via ORAL
  Filled 2023-05-08 (×7): qty 1

## 2023-05-08 MED ORDER — MELATONIN 3 MG PO TABS
3.0000 mg | ORAL_TABLET | Freq: Every evening | ORAL | Status: DC | PRN
  Administered 2023-05-09 – 2023-05-11 (×2): 3 mg via ORAL
  Filled 2023-05-08 (×2): qty 1

## 2023-05-08 MED ORDER — MEMANTINE HCL 10 MG PO TABS
10.0000 mg | ORAL_TABLET | Freq: Two times a day (BID) | ORAL | Status: DC
  Administered 2023-05-08 – 2023-05-14 (×13): 10 mg via ORAL
  Filled 2023-05-08 (×13): qty 1

## 2023-05-08 MED ORDER — MIRTAZAPINE 15 MG PO TABS
15.0000 mg | ORAL_TABLET | Freq: Every day | ORAL | Status: DC
  Administered 2023-05-08 – 2023-05-13 (×6): 15 mg via ORAL
  Filled 2023-05-08 (×6): qty 1

## 2023-05-08 MED ORDER — MELATONIN 3 MG PO TABS: 3.0000 mg | ORAL_TABLET | Freq: Every day | ORAL | Status: DC

## 2023-05-08 MED ORDER — HYDROMORPHONE HCL 1 MG/ML IJ SOLN
0.5000 mg | Freq: Once | INTRAMUSCULAR | Status: AC
  Administered 2023-05-08: 0.5 mg via INTRAVENOUS
  Filled 2023-05-08: qty 1

## 2023-05-08 MED ORDER — FLUTICASONE FUROATE-VILANTEROL 200-25 MCG/ACT IN AEPB
1.0000 | INHALATION_SPRAY | Freq: Every day | RESPIRATORY_TRACT | Status: DC
  Administered 2023-05-08 – 2023-05-14 (×7): 1 via RESPIRATORY_TRACT
  Filled 2023-05-08 (×3): qty 28

## 2023-05-08 MED ORDER — UMECLIDINIUM BROMIDE 62.5 MCG/ACT IN AEPB
1.0000 | INHALATION_SPRAY | Freq: Every day | RESPIRATORY_TRACT | Status: DC
  Administered 2023-05-08 – 2023-05-14 (×7): 1 via RESPIRATORY_TRACT
  Filled 2023-05-08 (×2): qty 7

## 2023-05-08 MED ORDER — FLUOXETINE HCL 20 MG PO CAPS
40.0000 mg | ORAL_CAPSULE | Freq: Every day | ORAL | Status: DC
  Administered 2023-05-08 – 2023-05-14 (×7): 40 mg via ORAL
  Filled 2023-05-08 (×7): qty 2

## 2023-05-08 MED ORDER — HYDROMORPHONE HCL 1 MG/ML IJ SOLN
0.5000 mg | INTRAMUSCULAR | Status: DC | PRN
  Administered 2023-05-08 (×2): 1 mg via INTRAVENOUS
  Administered 2023-05-09: 0.5 mg via INTRAVENOUS
  Filled 2023-05-08 (×4): qty 1

## 2023-05-08 MED ORDER — PANTOPRAZOLE SODIUM 40 MG PO TBEC
40.0000 mg | DELAYED_RELEASE_TABLET | Freq: Every day | ORAL | Status: DC
  Administered 2023-05-08 – 2023-05-14 (×7): 40 mg via ORAL
  Filled 2023-05-08 (×7): qty 1

## 2023-05-08 MED ORDER — SODIUM CHLORIDE 0.9 % IV SOLN: 250.0000 mL | INTRAVENOUS | Status: AC | PRN

## 2023-05-08 MED ORDER — MONTELUKAST SODIUM 10 MG PO TABS
10.0000 mg | ORAL_TABLET | Freq: Every day | ORAL | Status: DC
  Administered 2023-05-08 – 2023-05-14 (×7): 10 mg via ORAL
  Filled 2023-05-08 (×7): qty 1

## 2023-05-08 MED ORDER — ACETAMINOPHEN 650 MG RE SUPP: 650.0000 mg | Freq: Four times a day (QID) | RECTAL | Status: DC | PRN

## 2023-05-08 MED ORDER — ENSURE ENLIVE PO LIQD
237.0000 mL | Freq: Two times a day (BID) | ORAL | Status: DC
  Administered 2023-05-10 – 2023-05-13 (×7): 237 mL via ORAL

## 2023-05-08 MED ORDER — ENSURE PRE-SURGERY PO LIQD
296.0000 mL | Freq: Once | ORAL | Status: AC
  Administered 2023-05-09: 296 mL via ORAL
  Filled 2023-05-08 (×2): qty 296

## 2023-05-08 MED ORDER — LEVALBUTEROL HCL 0.63 MG/3ML IN NEBU: 0.6300 mg | INHALATION_SOLUTION | Freq: Four times a day (QID) | RESPIRATORY_TRACT | Status: DC | PRN

## 2023-05-08 MED ORDER — BISACODYL 5 MG PO TBEC
5.0000 mg | DELAYED_RELEASE_TABLET | Freq: Every day | ORAL | Status: DC | PRN
  Administered 2023-05-10: 5 mg via ORAL
  Filled 2023-05-08: qty 1

## 2023-05-08 MED ORDER — ONDANSETRON HCL 4 MG/2ML IJ SOLN
4.0000 mg | Freq: Four times a day (QID) | INTRAMUSCULAR | Status: DC | PRN
  Administered 2023-05-08 – 2023-05-09 (×2): 4 mg via INTRAVENOUS
  Filled 2023-05-08 (×2): qty 2

## 2023-05-08 MED ORDER — DEXTROSE IN LACTATED RINGERS 5 % IV SOLN: INTRAVENOUS | Status: DC

## 2023-05-08 MED ORDER — SODIUM CHLORIDE 0.9% FLUSH
3.0000 mL | Freq: Two times a day (BID) | INTRAVENOUS | Status: DC
  Administered 2023-05-10 – 2023-05-14 (×4): 3 mL via INTRAVENOUS

## 2023-05-08 NOTE — Progress Notes (Signed)
PT Cancellation Note  Patient Details Name: Andrea Larson MRN: [id number] DOB: 11-01-1945   Cancelled Treatment:    Reason Eval/Treat Not Completed: Other (comment) per chart, sounds like she will be getting surgery for ORIF following fall with pubic fractures and subcapital R femoral neck fracture. Will plan on PT eval after procedure, reached out to team to clarify surgery date/timing.   Nedra Hai, PT, DPT 05/08/23 8:59 AM

## 2023-05-08 NOTE — Consult Note (Signed)
Reason for Consult:Right hip fx Referring Physician: Burnadette Pop Time called: 0730 Time at bedside: 1015   Andrea Larson is an 77 y.o. female.  HPI: Andrea Larson was getting up from her chair to turn on a light when she fell unexpectedly. She had immediate right hip pain and could not get up. She was brought to the ED where x-rays showed a right hip fx and orthopedic surgery was consulted. She lives at home alone and does not use any assistive devices to ambulate.  Past Medical History:  Diagnosis Date   Allergic rhinitis    Allergic rhinitis, cause unspecified 09/21/2011   Aortic stenosis 08/23/2016   Arthritis    Asthma    Asthma 09/21/2011   Colon polyps    COPD (chronic obstructive pulmonary disease) (HCC)    mild    Coronary artery calcification 08/23/2016   Dementia (HCC)    Depression with anxiety    HTN (hypertension)    Hyperlipidemia    UTI (lower urinary tract infection)     Past Surgical History:  Procedure Laterality Date   DENTAL SURGERY     pilonidial cyst     TONSILLECTOMY      Family History  Problem Relation Age of Onset   Alcohol abuse Other    Arthritis Other    Heart disease Other    Stroke Other    Hypertension Other    Hypertension Father    Stroke Father    Atrial fibrillation Brother    Other Mother        unsure of medical history   Colon cancer Neg Hx     Social History:  reports that she quit smoking about 8 years ago. Her smoking use included cigarettes. She started smoking about 63 years ago. She has a 41.3 pack-year smoking history. She has never used smokeless tobacco. She reports that she does not currently use alcohol. She reports that she does not use drugs.  Allergies:  Allergies  Allergen Reactions   Lipitor [Atorvastatin] Other (See Comments)    Myalgias    Medications: I have reviewed the patient's current medications.  Results for orders placed or performed during the hospital encounter of 05/07/23 (from the past 48  hour(s))  Comprehensive metabolic panel     Status: Abnormal   Collection Time: 05/07/23  8:58 PM  Result Value Ref Range   Sodium 137 135 - 145 mmol/L   Potassium 4.7 3.5 - 5.1 mmol/L   Chloride 99 98 - 111 mmol/L   CO2 30 22 - 32 mmol/L   Glucose, Bld 94 70 - 99 mg/dL    Comment: Glucose reference range applies only to samples taken after fasting for at least 8 hours.   BUN 17 8 - 23 mg/dL   Creatinine, Ser 1.30 (H) 0.44 - 1.00 mg/dL   Calcium 9.4 8.9 - 86.5 mg/dL   Total Protein 6.9 6.5 - 8.1 g/dL   Albumin 3.3 (L) 3.5 - 5.0 g/dL   AST 30 15 - 41 U/L   ALT 25 0 - 44 U/L   Alkaline Phosphatase 69 38 - 126 U/L   Total Bilirubin 0.7 0.3 - 1.2 mg/dL   GFR, Estimated 51 (L) >60 mL/min    Comment: (NOTE) Calculated using the CKD-EPI Creatinine Equation (2021)    Anion gap 8 5 - 15    Comment: Performed at [facility], [address] [place], [place] [address]  CBC with Differential     Status: Abnormal  Collection Time: 05/07/23  8:58 PM  Result Value Ref Range   WBC 14.9 (H) 4.0 - 10.5 K/uL   RBC 4.57 3.87 - 5.11 MIL/uL   Hemoglobin 13.4 12.0 - 15.0 g/dL   HCT 40.9 81.1 - 91.4 %   MCV 91.7 80.0 - 100.0 fL   MCH 29.3 26.0 - 34.0 pg   MCHC 32.0 30.0 - 36.0 g/dL   RDW 78.2 95.6 - 21.3 %   Platelets 274 150 - 400 K/uL   nRBC 0.0 0.0 - 0.2 %   Neutrophils Relative % 83 %   Neutro Abs 12.4 (H) 1.7 - 7.7 K/uL   Lymphocytes Relative 9 %   Lymphs Abs 1.3 0.7 - 4.0 K/uL   Monocytes Relative 7 %   Monocytes Absolute 1.1 (H) 0.1 - 1.0 K/uL   Eosinophils Relative 0 %   Eosinophils Absolute 0.0 0.0 - 0.5 K/uL   Basophils Relative 0 %   Basophils Absolute 0.1 0.0 - 0.1 K/uL   Immature Granulocytes 1 %   Abs Immature Granulocytes 0.09 (H) 0.00 - 0.07 K/uL    Comment: Performed at [facility], [address], [place], [place] [address]  Urinalysis, w/ Reflex to Culture (Infection Suspected) -Urine, Catheterized     Status: Abnormal   Collection Time: 05/07/23 11:11 PM   Result Value Ref Range   Specimen Source URINE, CLEAN CATCH    Color, Urine YELLOW YELLOW   APPearance CLEAR CLEAR   Specific Gravity, Urine 1.014 1.005 - 1.030   pH 6.0 5.0 - 8.0   Glucose, UA NEGATIVE NEGATIVE mg/dL   Hgb urine dipstick NEGATIVE NEGATIVE   Bilirubin Urine NEGATIVE NEGATIVE   Ketones, ur NEGATIVE NEGATIVE mg/dL   Protein, ur NEGATIVE NEGATIVE mg/dL   Nitrite NEGATIVE NEGATIVE   Leukocytes,Ua NEGATIVE NEGATIVE   RBC / HPF 0-5 0 - 5 RBC/hpf   WBC, UA 0-5 0 - 5 WBC/hpf    Comment:        Reflex urine culture not performed if WBC <=10, OR if Squamous epithelial cells >5. If Squamous epithelial cells >5 suggest recollection.    Bacteria, UA RARE (A) NONE SEEN   Squamous Epithelial / HPF 0-5 0 - 5 /HPF   Mucus PRESENT     Comment: Performed at [facility], [address], [place], [place] [address]    CT PELVIS WO CONTRAST  Result Date: 05/08/2023 CLINICAL DATA:  Hip trauma fall EXAM: CT PELVIS WITHOUT CONTRAST TECHNIQUE: Multidetector CT imaging of the pelvis was performed following the standard protocol without intravenous contrast. RADIATION DOSE REDUCTION: This exam was performed according to the departmental dose-optimization program which includes automated exposure control, adjustment of the mA and/or kV according to patient size and/or use of iterative reconstruction technique. COMPARISON:  05/07/2023 FINDINGS: Urinary Tract:  Urinary bladder is unremarkable Bowel: No acute bowel wall thickening. Diverticular disease of the sigmoid colon. Vascular/Lymphatic: Aortic atherosclerosis. No aneurysm. No suspicious lymph nodes Reproductive:  Uterus unremarkable.  No adnexal mass. Other:  Negative for pelvic effusion or free air. Musculoskeletal: Acute nondisplaced right superior and inferior pubic rami fractures. Acute mildly impacted subcapital right femoral neck fracture. No femoral head dislocation. Pubic symphysis appears intact. Mild asymmetric enlargement of  the right operator muscles consistent with soft tissue injury. IMPRESSION: 1. Acute nondisplaced right superior and inferior pubic rami fractures. 2. Acute mildly impacted subcapital right femoral neck fracture. 3. Aortic atherosclerosis. Aortic Atherosclerosis (ICD10-I70.0). Electronically Signed   By: Jasmine Pang  M.D.   On: 05/08/2023 00:27   CT Lumbar Spine Wo Contrast  Result Date: 05/07/2023 CLINICAL DATA:  Trauma EXAM: CT LUMBAR SPINE WITHOUT CONTRAST TECHNIQUE: Multidetector CT imaging of the lumbar spine was performed without intravenous contrast administration. Multiplanar CT image reconstructions were also generated. RADIATION DOSE REDUCTION: This exam was performed according to the departmental dose-optimization program which includes automated exposure control, adjustment of the mA and/or kV according to patient size and/or use of iterative reconstruction technique. COMPARISON:  None Available. FINDINGS: Segmentation: 5 lumbar type vertebrae. Alignment: Grade 1 retrolisthesis at L1-2 and L2-3. Vertebrae: Wedge compression fracture of T10 with 50% height loss. This is chronic. No acute fracture. Paraspinal and other soft tissues: Calcific aortic atherosclerosis. Disc levels: No bony spinal canal stenosis.  No neural impingement. IMPRESSION: 1. No acute fracture or static subluxation of the lumbar spine. 2. Chronic wedge compression fracture of T10 with 50% height loss. Aortic Atherosclerosis (ICD10-I70.0). Electronically Signed   By: Deatra Robinson M.D.   On: 05/07/2023 23:53   DG Pelvis 1-2 Views  Result Date: 05/07/2023 CLINICAL DATA:  Pain EXAM: PELVIS - 1-2 VIEW COMPARISON:  None Available. FINDINGS: The bones are osteopenic. Right hip is slightly rotated which limits evaluation. There are findings suspicious for nondisplaced fracture of the right superior pubic ramus. There is no dislocation. Soft tissues are within normal limits. IMPRESSION: Findings suspicious for nondisplaced fracture  of the right superior pubic ramus. Limited evaluation of the right hip secondary to rotation. Recommend dedicated hip x-ray for further evaluation if clinically warranted. Electronically Signed   By: Darliss Cheney M.D.   On: 05/07/2023 22:30    Review of Systems  HENT:  Negative for ear discharge, ear pain, hearing loss and tinnitus.   Eyes:  Negative for photophobia and pain.  Respiratory:  Negative for cough and shortness of breath.   Cardiovascular:  Negative for chest pain.  Gastrointestinal:  Negative for abdominal pain, nausea and vomiting.  Genitourinary:  Negative for dysuria, flank pain, frequency and urgency.  Musculoskeletal:  Positive for arthralgias (Right hip). Negative for back pain, myalgias and neck pain.  Neurological:  Negative for dizziness and headaches.  Hematological:  Does not bruise/bleed easily.  Psychiatric/Behavioral:  The patient is not nervous/anxious.    Blood pressure 110/70, pulse 100, temperature 98.1 F (36.7 C), temperature source Oral, resp. rate 16, height 5\' 2"  (1.575 m), weight 53 kg, SpO2 91%. Physical Exam Constitutional:      General: She is not in acute distress.    Appearance: She is well-developed. She is not diaphoretic.  HENT:     Head: Normocephalic and atraumatic.  Eyes:     General: No scleral icterus.       Right eye: No discharge.        Left eye: No discharge.     Conjunctiva/sclera: Conjunctivae normal.  Cardiovascular:     Rate and Rhythm: Normal rate and regular rhythm.  Pulmonary:     Effort: Pulmonary effort is normal. No respiratory distress.  Musculoskeletal:     Cervical back: Normal range of motion.     Comments: RLE No traumatic wounds, ecchymosis, or rash  Mild TTP hip  No knee or ankle effusion  Knee stable to varus/ valgus and anterior/posterior stress  Sens DPN, SPN, TN intact  Motor EHL, ext, flex, evers 5/5  DP 2+, PT 2+, No significant edema  Skin:    General: Skin is warm and dry.  Neurological:      Mental  Status: She is alert.  Psychiatric:        Mood and Affect: Mood normal.        Behavior: Behavior normal.     Assessment/Plan: Right hip fx -- Plan hip hemi tomorrow at [facility] with Dr. Linna Caprice. Please keep NPO after MN. Multiple medical problems including essential hypertension, generalized anxiety disorder, dementia, CKD stage 3a, COPD, generalized anxiety disorder, and vitamin B12 deficiency -- per primary service    Freeman Caldron, PA-C Orthopedic Surgery [phone] 05/08/2023, 10:25 AM

## 2023-05-08 NOTE — ED Provider Notes (Signed)
I assumed care of this patient from previous provider.  Please see their note for further details of history, exam, and MDM.   Briefly patient is a 77 y.o. female who presented hip pain s/p mechanical call. Pending CT  CT with pubic rami fx and femoral neck fracture. Dr. Linna Caprice from Ortho consulted. Admitted to medicine.        Nira Conn, MD 05/08/23 [phone]

## 2023-05-08 NOTE — Hospital Course (Addendum)
77 year old female history of essential hypertension, generalized anxiety disorder, dementia, CKD stage IIIa, reactive airway disease (unsure asthma versus COPD), vitamin B12 deficiency presented to emergency department from his skilled nursing facility for evaluation for pain on the bilateral hip after patient had slipped from the chair.  Patient denies hitting of her head and lost consciousness. In the ED patient is hemodynamically stable except tachycardia 106. CBC showing leukocytosis 14.9. CMP unremarkable. UA unremarkable X-ray of pelvis suspicion for nondisplaced fracture of the right superior pubic rami. CT pelvis showed: 1. Acute nondisplaced right superior and inferior pubic rami fractures. 2. Acute mildly impacted subcapital right femoral neck fracture. 3. Aortic atherosclerosis.  CT lumbar spine: No acute fracture or subluxation of lumbar spine.  Chronic with compression fracture of T10 vertebrae. ED patient received morphine 4 mg x 2 doses and Dilaudid 0.5 mg 1 dose. -ED provider Dr. Eudelia Bunch spoke with on-call orthopedic surgeon Dr. Linna Caprice will see patient in the morning.  I am holding any bed request admission ordered at this time as I am not sure when unable to evaluate the patient by myself.

## 2023-05-08 NOTE — ED Notes (Signed)
ED TO INPATIENT HANDOFF REPORT  ED Nurse Name and Phone #:  Theadora Rama RN  S Name/Age/Gender Andrea Larson 78 y.o. female Room/Bed: 046C/046C  Code Status   Code Status: Limited: Do not attempt resuscitation (DNR) -DNR-LIMITED -Do Not Intubate/DNI   Home/SNF/Other Home Patient oriented to: self, place, time, and situation Is this baseline? Yes   Triage Complete: Triage complete  Chief Complaint Pelvic fracture (HCC) [S32.9XXA]  Triage Note Patient presents via EMS from [facility] for bilateral hip pain after a fall at approximately 1300 today. Per EMS, the staff at patient's facility initially sat her in a chair after the fall but then patient stated that she couldn't walk so they called 911. Per EMS, patient initially complained of left hip pain but then complained of right hip pain as well with palpation. Per EMS, patient has history of dementia. Per EMS, PMS intact. Pulses marked by EMS.   Allergies Allergies  Allergen Reactions   Lipitor [Atorvastatin] Other (See Comments)    Myalgias    Level of Care/Admitting Diagnosis ED Disposition     ED Disposition  Admit   Condition  --   Comment  Hospital Area: [facility] [[id number]]  Level of Care: Med-Surg [16]  May admit patient to [facility] or [facility] if equivalent level of care is available:: No  Covid Evaluation: Asymptomatic - no recent exposure (last 10 days) testing not required  Diagnosis: Pelvic fracture [facility]) [[id number]]  Admitting Physician: Tereasa Coop [[id number]]  Attending Physician: Tereasa Coop [[id number]]  Certification:: I certify this patient will need inpatient services for at least 2 midnights  Expected Medical Readiness: 05/13/2023          B Medical/Surgery History Past Medical History:  Diagnosis Date   Allergic rhinitis    Allergic rhinitis, cause unspecified 09/21/2011   Aortic stenosis 08/23/2016   Arthritis    Asthma    Asthma 09/21/2011   Colon polyps     COPD (chronic obstructive pulmonary disease) (HCC)    mild    Coronary artery calcification 08/23/2016   Dementia (HCC)    Depression with anxiety    HTN (hypertension)    Hyperlipidemia    UTI (lower urinary tract infection)    Past Surgical History:  Procedure Laterality Date   DENTAL SURGERY     pilonidial cyst     TONSILLECTOMY       A IV Location/Drains/Wounds Patient Lines/Drains/Airways Status     Active Line/Drains/Airways     Name Placement date Placement time Site Days   Peripheral IV 01/08/23 20 G 1" Left Antecubital 01/08/23  --  Antecubital  120   Wound / Incision (Open or Dehisced) 05/02/18 Other (Comment) Arm Right skin tear due to dog scratch, covered by band aid 05/02/18  1018  Arm  1832            Intake/Output Last 24 hours No intake or output data in the 24 hours ending 05/08/23 9518  Labs/Imaging Results for orders placed or performed during the hospital encounter of 05/07/23 (from the past 48 hour(s))  Comprehensive metabolic panel     Status: Abnormal   Collection Time: 05/07/23  8:58 PM  Result Value Ref Range   Sodium 137 135 - 145 mmol/L   Potassium 4.7 3.5 - 5.1 mmol/L   Chloride 99 98 - 111 mmol/L   CO2 30 22 - 32 mmol/L   Glucose, Bld 94 70 - 99 mg/dL    Comment: Glucose reference  range applies only to samples taken after fasting for at least 8 hours.   BUN 17 8 - 23 mg/dL   Creatinine, Ser 1.61 (H) 0.44 - 1.00 mg/dL   Calcium 9.4 8.9 - 09.6 mg/dL   Total Protein 6.9 6.5 - 8.1 g/dL   Albumin 3.3 (L) 3.5 - 5.0 g/dL   AST 30 15 - 41 U/L   ALT 25 0 - 44 U/L   Alkaline Phosphatase 69 38 - 126 U/L   Total Bilirubin 0.7 0.3 - 1.2 mg/dL   GFR, Estimated 51 (L) >60 mL/min    Comment: (NOTE) Calculated using the CKD-EPI Creatinine Equation (2021)    Anion gap 8 5 - 15    Comment: Performed at [facility], [address], [place], [place] [address]  CBC with Differential     Status: Abnormal   Collection Time: 05/07/23  8:58 PM   Result Value Ref Range   WBC 14.9 (H) 4.0 - 10.5 K/uL   RBC 4.57 3.87 - 5.11 MIL/uL   Hemoglobin 13.4 12.0 - 15.0 g/dL   HCT 98.1 19.1 - 47.8 %   MCV 91.7 80.0 - 100.0 fL   MCH 29.3 26.0 - 34.0 pg   MCHC 32.0 30.0 - 36.0 g/dL   RDW 29.5 62.1 - 30.8 %   Platelets 274 150 - 400 K/uL   nRBC 0.0 0.0 - 0.2 %   Neutrophils Relative % 83 %   Neutro Abs 12.4 (H) 1.7 - 7.7 K/uL   Lymphocytes Relative 9 %   Lymphs Abs 1.3 0.7 - 4.0 K/uL   Monocytes Relative 7 %   Monocytes Absolute 1.1 (H) 0.1 - 1.0 K/uL   Eosinophils Relative 0 %   Eosinophils Absolute 0.0 0.0 - 0.5 K/uL   Basophils Relative 0 %   Basophils Absolute 0.1 0.0 - 0.1 K/uL   Immature Granulocytes 1 %   Abs Immature Granulocytes 0.09 (H) 0.00 - 0.07 K/uL    Comment: Performed at [facility], [address] [place], [place] [address]  Urinalysis, w/ Reflex to Culture (Infection Suspected) -Urine, Catheterized     Status: Abnormal   Collection Time: 05/07/23 11:11 PM  Result Value Ref Range   Specimen Source URINE, CLEAN CATCH    Color, Urine YELLOW YELLOW   APPearance CLEAR CLEAR   Specific Gravity, Urine 1.014 1.005 - 1.030   pH 6.0 5.0 - 8.0   Glucose, UA NEGATIVE NEGATIVE mg/dL   Hgb urine dipstick NEGATIVE NEGATIVE   Bilirubin Urine NEGATIVE NEGATIVE   Ketones, ur NEGATIVE NEGATIVE mg/dL   Protein, ur NEGATIVE NEGATIVE mg/dL   Nitrite NEGATIVE NEGATIVE   Leukocytes,Ua NEGATIVE NEGATIVE   RBC / HPF 0-5 0 - 5 RBC/hpf   WBC, UA 0-5 0 - 5 WBC/hpf    Comment:        Reflex urine culture not performed if WBC <=10, OR if Squamous epithelial cells >5. If Squamous epithelial cells >5 suggest recollection.    Bacteria, UA RARE (A) NONE SEEN   Squamous Epithelial / HPF 0-5 0 - 5 /HPF   Mucus PRESENT     Comment: Performed at [facility], [address] [place], [place] [address]   CT PELVIS WO CONTRAST  Result Date: 05/08/2023 CLINICAL DATA:  Hip trauma fall EXAM: CT PELVIS WITHOUT CONTRAST  TECHNIQUE: Multidetector CT imaging of the pelvis was performed following the standard protocol without intravenous contrast. RADIATION DOSE REDUCTION: This exam was performed according  to the departmental dose-optimization program which includes automated exposure control, adjustment of the mA and/or kV according to patient size and/or use of iterative reconstruction technique. COMPARISON:  05/07/2023 FINDINGS: Urinary Tract:  Urinary bladder is unremarkable Bowel: No acute bowel wall thickening. Diverticular disease of the sigmoid colon. Vascular/Lymphatic: Aortic atherosclerosis. No aneurysm. No suspicious lymph nodes Reproductive:  Uterus unremarkable.  No adnexal mass. Other:  Negative for pelvic effusion or free air. Musculoskeletal: Acute nondisplaced right superior and inferior pubic rami fractures. Acute mildly impacted subcapital right femoral neck fracture. No femoral head dislocation. Pubic symphysis appears intact. Mild asymmetric enlargement of the right operator muscles consistent with soft tissue injury. IMPRESSION: 1. Acute nondisplaced right superior and inferior pubic rami fractures. 2. Acute mildly impacted subcapital right femoral neck fracture. 3. Aortic atherosclerosis. Aortic Atherosclerosis (ICD10-I70.0). Electronically Signed   By: Jasmine Pang M.D.   On: 05/08/2023 00:27   CT Lumbar Spine Wo Contrast  Result Date: 05/07/2023 CLINICAL DATA:  Trauma EXAM: CT LUMBAR SPINE WITHOUT CONTRAST TECHNIQUE: Multidetector CT imaging of the lumbar spine was performed without intravenous contrast administration. Multiplanar CT image reconstructions were also generated. RADIATION DOSE REDUCTION: This exam was performed according to the departmental dose-optimization program which includes automated exposure control, adjustment of the mA and/or kV according to patient size and/or use of iterative reconstruction technique. COMPARISON:  None Available. FINDINGS: Segmentation: 5 lumbar type vertebrae.  Alignment: Grade 1 retrolisthesis at L1-2 and L2-3. Vertebrae: Wedge compression fracture of T10 with 50% height loss. This is chronic. No acute fracture. Paraspinal and other soft tissues: Calcific aortic atherosclerosis. Disc levels: No bony spinal canal stenosis.  No neural impingement. IMPRESSION: 1. No acute fracture or static subluxation of the lumbar spine. 2. Chronic wedge compression fracture of T10 with 50% height loss. Aortic Atherosclerosis (ICD10-I70.0). Electronically Signed   By: Deatra Robinson M.D.   On: 05/07/2023 23:53   DG Pelvis 1-2 Views  Result Date: 05/07/2023 CLINICAL DATA:  Pain EXAM: PELVIS - 1-2 VIEW COMPARISON:  None Available. FINDINGS: The bones are osteopenic. Right hip is slightly rotated which limits evaluation. There are findings suspicious for nondisplaced fracture of the right superior pubic ramus. There is no dislocation. Soft tissues are within normal limits. IMPRESSION: Findings suspicious for nondisplaced fracture of the right superior pubic ramus. Limited evaluation of the right hip secondary to rotation. Recommend dedicated hip x-ray for further evaluation if clinically warranted. Electronically Signed   By: Darliss Cheney M.D.   On: 05/07/2023 22:30    Pending Labs Unresulted Labs (From admission, onward)     Start     Ordered   05/09/23 0500  Comprehensive metabolic panel  Tomorrow morning,   R        05/08/23 0559   05/09/23 0500  CBC  Tomorrow morning,   R        05/08/23 0559            Vitals/Pain Today's Vitals   05/08/23 0100 05/08/23 0130 05/08/23 0151 05/08/23 0605  BP: 110/76 103/83  107/68  Pulse: (!) 104 (!) 108  98  Resp: 15 (!) 22  10  Temp:      TempSrc:      SpO2: 96% 91%  98%  Weight:      Height:      PainSc:   8      Isolation Precautions No active isolations  Medications Medications  rosuvastatin (CRESTOR) tablet 20 mg (has no administration in time range)  buPROPion [facility]  XL) 24 hr tablet 150 mg (has no  administration in time range)  donepezil (ARICEPT) tablet 5 mg (has no administration in time range)  FLUoxetine (PROZAC) capsule 40 mg (has no administration in time range)  memantine (NAMENDA) tablet 10 mg (has no administration in time range)  mirtazapine (REMERON) tablet 15 mg (has no administration in time range)  pantoprazole (PROTONIX) EC tablet 40 mg (has no administration in time range)  folic acid (FOLVITE) tablet 1 mg (has no administration in time range)  cyanocobalamin (VITAMIN B12) tablet 1,000 mcg (has no administration in time range)  melatonin tablet 3 mg (has no administration in time range)  fluticasone (FLONASE) 50 MCG/ACT nasal spray 1 spray (has no administration in time range)  levalbuterol (XOPENEX) nebulizer solution 0.63 mg (has no administration in time range)  montelukast (SINGULAIR) tablet 10 mg (has no administration in time range)  fluticasone furoate-vilanterol (BREO ELLIPTA) 200-25 MCG/ACT 1 puff (has no administration in time range)    And  umeclidinium bromide (INCRUSE ELLIPTA) 62.5 MCG/ACT 1 puff (has no administration in time range)  sodium chloride flush (NS) 0.9 % injection 3 mL (has no administration in time range)  sodium chloride flush (NS) 0.9 % injection 3 mL (has no administration in time range)  0.9 %  sodium chloride infusion (has no administration in time range)  dextrose 5 % in lactated ringers infusion (has no administration in time range)  acetaminophen (TYLENOL) tablet 650 mg (has no administration in time range)    Or  acetaminophen (TYLENOL) suppository 650 mg (has no administration in time range)  HYDROmorphone (DILAUDID) injection 0.5-1 mg (has no administration in time range)  bisacodyl (DULCOLAX) EC tablet 5 mg (has no administration in time range)  ondansetron (ZOFRAN) tablet 4 mg (has no administration in time range)    Or  ondansetron (ZOFRAN) injection 4 mg (has no administration in time range)  morphine (PF) 4 MG/ML injection 4  mg (4 mg Intravenous Given 05/07/23 2105)  morphine (PF) 4 MG/ML injection 4 mg (4 mg Intravenous Given 05/07/23 2328)  ondansetron (ZOFRAN) injection 4 mg (4 mg Intravenous Given 05/07/23 2328)  HYDROmorphone (DILAUDID) injection 0.5 mg (0.5 mg Intravenous Given 05/08/23 0131)    Mobility non-ambulatory     Focused Assessments Cardiac Assessment Handoff:  Cardiac Rhythm: Sinus tachycardia No results found for: "CKTOTAL", "CKMB", "CKMBINDEX", "TROPONINI" Lab Results  Component Value Date   DDIMER 0.84 (H) 01/11/2023   Does the Patient currently have chest pain? No   , Pulmonary Assessment Handoff:  Lung sounds:   O2 Device: Nasal Cannula O2 Flow Rate (L/min): 2 L/min    R Recommendations: See Admitting Provider Note  Report given to:   Additional Notes:

## 2023-05-08 NOTE — TOC CAGE-AID Note (Signed)
Transition of Care [facility]) - CAGE-AID Screening  Patient Details  Name: Andrea Larson MRN: [id number] Date of Birth: Oct 18, 1945  Clinical Narrative:  Patient denies any current alcohol or drug use, no need for substance abuse resources at this time.  CAGE-AID Screening:    Have You Ever Felt You Ought to Cut Down on Your Drinking or Drug Use?: No Have People Annoyed You By Critizing Your Drinking Or Drug Use?: No Have You Felt Bad Or Guilty About Your Drinking Or Drug Use?: No Have You Ever Had a Drink or Used Drugs First Thing In The Morning to Steady Your Nerves or to Get Rid of a Hangover?: No CAGE-AID Score: 0  Substance Abuse Education Offered: No

## 2023-05-08 NOTE — Progress Notes (Signed)
Bladder scanned at 1200.  0ml.  Bladder scanned at 1750 4ml.   Pt has not urinated all day.  Pt c/o of pain in bladder area.  Bladder palpates with fullness and pain to patient.  Notified MD.  Orders to place foley catheter for retention.

## 2023-05-08 NOTE — ED Notes (Signed)
Called to  let floor know that patient is coming up since over 40 mins mark.

## 2023-05-08 NOTE — H&P (View-Only) (Signed)
Reason for Consult:Right hip fx Referring Physician: Burnadette Pop Time called: 0730 Time at bedside: 1015   Andrea Larson is an 77 y.o. female.  HPI: Andrea Larson was getting up from her chair to turn on a light when she fell unexpectedly. She had immediate right hip pain and could not get up. She was brought to the ED where x-rays showed a right hip fx and orthopedic surgery was consulted. She lives at home alone and does not use any assistive devices to ambulate.  Past Medical History:  Diagnosis Date   Allergic rhinitis    Allergic rhinitis, cause unspecified 09/21/2011   Aortic stenosis 08/23/2016   Arthritis    Asthma    Asthma 09/21/2011   Colon polyps    COPD (chronic obstructive pulmonary disease) (HCC)    mild    Coronary artery calcification 08/23/2016   Dementia (HCC)    Depression with anxiety    HTN (hypertension)    Hyperlipidemia    UTI (lower urinary tract infection)     Past Surgical History:  Procedure Laterality Date   DENTAL SURGERY     pilonidial cyst     TONSILLECTOMY      Family History  Problem Relation Age of Onset   Alcohol abuse Other    Arthritis Other    Heart disease Other    Stroke Other    Hypertension Other    Hypertension Father    Stroke Father    Atrial fibrillation Brother    Other Mother        unsure of medical history   Colon cancer Neg Hx     Social History:  reports that she quit smoking about 8 years ago. Her smoking use included cigarettes. She started smoking about 63 years ago. She has a 41.3 pack-year smoking history. She has never used smokeless tobacco. She reports that she does not currently use alcohol. She reports that she does not use drugs.  Allergies:  Allergies  Allergen Reactions   Lipitor [Atorvastatin] Other (See Comments)    Myalgias    Medications: I have reviewed the patient's current medications.  Results for orders placed or performed during the hospital encounter of 05/07/23 (from the past 48  hour(s))  Comprehensive metabolic panel     Status: Abnormal   Collection Time: 05/07/23  8:58 PM  Result Value Ref Range   Sodium 137 135 - 145 mmol/L   Potassium 4.7 3.5 - 5.1 mmol/L   Chloride 99 98 - 111 mmol/L   CO2 30 22 - 32 mmol/L   Glucose, Bld 94 70 - 99 mg/dL    Comment: Glucose reference range applies only to samples taken after fasting for at least 8 hours.   BUN 17 8 - 23 mg/dL   Creatinine, Ser 1.30 (H) 0.44 - 1.00 mg/dL   Calcium 9.4 8.9 - 86.5 mg/dL   Total Protein 6.9 6.5 - 8.1 g/dL   Albumin 3.3 (L) 3.5 - 5.0 g/dL   AST 30 15 - 41 U/L   ALT 25 0 - 44 U/L   Alkaline Phosphatase 69 38 - 126 U/L   Total Bilirubin 0.7 0.3 - 1.2 mg/dL   GFR, Estimated 51 (L) >60 mL/min    Comment: (NOTE) Calculated using the CKD-EPI Creatinine Equation (2021)    Anion gap 8 5 - 15    Comment: Performed at [facility], [address] [place], [place] [address]  CBC with Differential     Status: Abnormal  Collection Time: 05/07/23  8:58 PM  Result Value Ref Range   WBC 14.9 (H) 4.0 - 10.5 K/uL   RBC 4.57 3.87 - 5.11 MIL/uL   Hemoglobin 13.4 12.0 - 15.0 g/dL   HCT 40.9 81.1 - 91.4 %   MCV 91.7 80.0 - 100.0 fL   MCH 29.3 26.0 - 34.0 pg   MCHC 32.0 30.0 - 36.0 g/dL   RDW 78.2 95.6 - 21.3 %   Platelets 274 150 - 400 K/uL   nRBC 0.0 0.0 - 0.2 %   Neutrophils Relative % 83 %   Neutro Abs 12.4 (H) 1.7 - 7.7 K/uL   Lymphocytes Relative 9 %   Lymphs Abs 1.3 0.7 - 4.0 K/uL   Monocytes Relative 7 %   Monocytes Absolute 1.1 (H) 0.1 - 1.0 K/uL   Eosinophils Relative 0 %   Eosinophils Absolute 0.0 0.0 - 0.5 K/uL   Basophils Relative 0 %   Basophils Absolute 0.1 0.0 - 0.1 K/uL   Immature Granulocytes 1 %   Abs Immature Granulocytes 0.09 (H) 0.00 - 0.07 K/uL    Comment: Performed at [facility], [address], [place], [place] [address]  Urinalysis, w/ Reflex to Culture (Infection Suspected) -Urine, Catheterized     Status: Abnormal   Collection Time: 05/07/23 11:11 PM   Result Value Ref Range   Specimen Source URINE, CLEAN CATCH    Color, Urine YELLOW YELLOW   APPearance CLEAR CLEAR   Specific Gravity, Urine 1.014 1.005 - 1.030   pH 6.0 5.0 - 8.0   Glucose, UA NEGATIVE NEGATIVE mg/dL   Hgb urine dipstick NEGATIVE NEGATIVE   Bilirubin Urine NEGATIVE NEGATIVE   Ketones, ur NEGATIVE NEGATIVE mg/dL   Protein, ur NEGATIVE NEGATIVE mg/dL   Nitrite NEGATIVE NEGATIVE   Leukocytes,Ua NEGATIVE NEGATIVE   RBC / HPF 0-5 0 - 5 RBC/hpf   WBC, UA 0-5 0 - 5 WBC/hpf    Comment:        Reflex urine culture not performed if WBC <=10, OR if Squamous epithelial cells >5. If Squamous epithelial cells >5 suggest recollection.    Bacteria, UA RARE (A) NONE SEEN   Squamous Epithelial / HPF 0-5 0 - 5 /HPF   Mucus PRESENT     Comment: Performed at [facility], [address], [place], [place] [address]    CT PELVIS WO CONTRAST  Result Date: 05/08/2023 CLINICAL DATA:  Hip trauma fall EXAM: CT PELVIS WITHOUT CONTRAST TECHNIQUE: Multidetector CT imaging of the pelvis was performed following the standard protocol without intravenous contrast. RADIATION DOSE REDUCTION: This exam was performed according to the departmental dose-optimization program which includes automated exposure control, adjustment of the mA and/or kV according to patient size and/or use of iterative reconstruction technique. COMPARISON:  05/07/2023 FINDINGS: Urinary Tract:  Urinary bladder is unremarkable Bowel: No acute bowel wall thickening. Diverticular disease of the sigmoid colon. Vascular/Lymphatic: Aortic atherosclerosis. No aneurysm. No suspicious lymph nodes Reproductive:  Uterus unremarkable.  No adnexal mass. Other:  Negative for pelvic effusion or free air. Musculoskeletal: Acute nondisplaced right superior and inferior pubic rami fractures. Acute mildly impacted subcapital right femoral neck fracture. No femoral head dislocation. Pubic symphysis appears intact. Mild asymmetric enlargement of  the right operator muscles consistent with soft tissue injury. IMPRESSION: 1. Acute nondisplaced right superior and inferior pubic rami fractures. 2. Acute mildly impacted subcapital right femoral neck fracture. 3. Aortic atherosclerosis. Aortic Atherosclerosis (ICD10-I70.0). Electronically Signed   By: Jasmine Pang  M.D.   On: 05/08/2023 00:27   CT Lumbar Spine Wo Contrast  Result Date: 05/07/2023 CLINICAL DATA:  Trauma EXAM: CT LUMBAR SPINE WITHOUT CONTRAST TECHNIQUE: Multidetector CT imaging of the lumbar spine was performed without intravenous contrast administration. Multiplanar CT image reconstructions were also generated. RADIATION DOSE REDUCTION: This exam was performed according to the departmental dose-optimization program which includes automated exposure control, adjustment of the mA and/or kV according to patient size and/or use of iterative reconstruction technique. COMPARISON:  None Available. FINDINGS: Segmentation: 5 lumbar type vertebrae. Alignment: Grade 1 retrolisthesis at L1-2 and L2-3. Vertebrae: Wedge compression fracture of T10 with 50% height loss. This is chronic. No acute fracture. Paraspinal and other soft tissues: Calcific aortic atherosclerosis. Disc levels: No bony spinal canal stenosis.  No neural impingement. IMPRESSION: 1. No acute fracture or static subluxation of the lumbar spine. 2. Chronic wedge compression fracture of T10 with 50% height loss. Aortic Atherosclerosis (ICD10-I70.0). Electronically Signed   By: Deatra Robinson M.D.   On: 05/07/2023 23:53   DG Pelvis 1-2 Views  Result Date: 05/07/2023 CLINICAL DATA:  Pain EXAM: PELVIS - 1-2 VIEW COMPARISON:  None Available. FINDINGS: The bones are osteopenic. Right hip is slightly rotated which limits evaluation. There are findings suspicious for nondisplaced fracture of the right superior pubic ramus. There is no dislocation. Soft tissues are within normal limits. IMPRESSION: Findings suspicious for nondisplaced fracture  of the right superior pubic ramus. Limited evaluation of the right hip secondary to rotation. Recommend dedicated hip x-ray for further evaluation if clinically warranted. Electronically Signed   By: Darliss Cheney M.D.   On: 05/07/2023 22:30    Review of Systems  HENT:  Negative for ear discharge, ear pain, hearing loss and tinnitus.   Eyes:  Negative for photophobia and pain.  Respiratory:  Negative for cough and shortness of breath.   Cardiovascular:  Negative for chest pain.  Gastrointestinal:  Negative for abdominal pain, nausea and vomiting.  Genitourinary:  Negative for dysuria, flank pain, frequency and urgency.  Musculoskeletal:  Positive for arthralgias (Right hip). Negative for back pain, myalgias and neck pain.  Neurological:  Negative for dizziness and headaches.  Hematological:  Does not bruise/bleed easily.  Psychiatric/Behavioral:  The patient is not nervous/anxious.    Blood pressure 110/70, pulse 100, temperature 98.1 F (36.7 C), temperature source Oral, resp. rate 16, height 5\' 2"  (1.575 m), weight 53 kg, SpO2 91%. Physical Exam Constitutional:      General: She is not in acute distress.    Appearance: She is well-developed. She is not diaphoretic.  HENT:     Head: Normocephalic and atraumatic.  Eyes:     General: No scleral icterus.       Right eye: No discharge.        Left eye: No discharge.     Conjunctiva/sclera: Conjunctivae normal.  Cardiovascular:     Rate and Rhythm: Normal rate and regular rhythm.  Pulmonary:     Effort: Pulmonary effort is normal. No respiratory distress.  Musculoskeletal:     Cervical back: Normal range of motion.     Comments: RLE No traumatic wounds, ecchymosis, or rash  Mild TTP hip  No knee or ankle effusion  Knee stable to varus/ valgus and anterior/posterior stress  Sens DPN, SPN, TN intact  Motor EHL, ext, flex, evers 5/5  DP 2+, PT 2+, No significant edema  Skin:    General: Skin is warm and dry.  Neurological:      Mental  Status: She is alert.  Psychiatric:        Mood and Affect: Mood normal.        Behavior: Behavior normal.     Assessment/Plan: Right hip fx -- Plan hip hemi tomorrow at [facility] with Dr. Linna Caprice. Please keep NPO after MN. Multiple medical problems including essential hypertension, generalized anxiety disorder, dementia, CKD stage 3a, COPD, generalized anxiety disorder, and vitamin B12 deficiency -- per primary service    Freeman Caldron, PA-C Orthopedic Surgery [phone] 05/08/2023, 10:25 AM

## 2023-05-08 NOTE — Progress Notes (Signed)
Consult received from EDP for retroverted R femoral neck fx, R superior/inferior pubic rami fxs. Admitted to [facility]. Patient needs surgery for pain control and immediate mobilization OOB. Plan for R hip hemiarthroplasty at [facility] tomorrow. Will need xfer to [facility] today. NPO after MN tonight. Will give OTO Lovenox this am, and then hold chemical DVT ppx. Discussed w/ Dr. Renford Dills. Full consult to follow.

## 2023-05-08 NOTE — H&P (Signed)
History and Physical    Andrea Larson QMV:[id number] DOB: [birth year] DOA: 05/07/2023  PCP: Caesar Bookman, NP   Patient coming from: SNF   Chief Complaint:  Chief Complaint  Patient presents with   Hip Pain   Fall   ED TRIAGE note:  HPI:  Andrea Larson is a 77 y.o. female with medical history significant of  essential hypertension, generalized anxiety disorder, dementia, CKD stage 3a, COPD, generalized anxiety disorder, and vitamin B12 deficiency presented to emergency department from his skilled nursing facility for evaluation for pain on the bilateral hip after patient had slipped from the chair.  Patient denies hitting of her head and lost consciousness. During my evaluation at the bedside, patient reporting that she has pain to her back and right-sided leg 6 out of 10 intensity.  Patient reported after the fall she did not lose consciousness or did not hit her head.  She is stating that she is feeling very dehydrated. At the point patient denies any chest pain, palpitation, headache, abdominal pain, nausea, vomiting, constipation or diarrhea.  In the ED patient is hemodynamically stable except tachycardia 106. CBC showing leukocytosis 14.9. CMP unremarkable. UA unremarkable X-ray of pelvis suspicion for nondisplaced fracture of the right superior pubic rami. CT pelvis showed: 1. Acute nondisplaced right superior and inferior pubic rami fractures. 2. Acute mildly impacted subcapital right femoral neck fracture. 3. Aortic atherosclerosis.  CT lumbar spine: No acute fracture or subluxation of lumbar spine.  Chronic with compression fracture of T10 vertebrae.  ED patient received morphine 4 mg x 2 doses and Dilaudid 0.5 mg 1 dose. ED provider Dr. Eudelia Bunch spoke with on-call orthopedic surgeon Dr. Linna Caprice will see patient in the morning. Hospitalist has been contacted coordinate management with orthopedic surgery and admission for fracture as described  above.   Review of Systems:  Review of Systems  Constitutional:  Negative for chills, fever, malaise/fatigue and weight loss.  Respiratory:  Negative for cough and shortness of breath.   Cardiovascular:  Negative for chest pain and palpitations.  Gastrointestinal:  Negative for heartburn and nausea.  Genitourinary:  Negative for dysuria.  Musculoskeletal:  Positive for back pain and falls. Negative for joint pain, myalgias and neck pain.       Right-sided pelvic pain and right-sided lower extremity pain around the upper thigh area.  Neurological:  Negative for dizziness and headaches.  Psychiatric/Behavioral:  The patient is not nervous/anxious.     Past Medical History:  Diagnosis Date   Allergic rhinitis    Allergic rhinitis, cause unspecified 09/21/2011   Aortic stenosis 08/23/2016   Arthritis    Asthma    Asthma 09/21/2011   Colon polyps    COPD (chronic obstructive pulmonary disease) (HCC)    mild    Coronary artery calcification 08/23/2016   Dementia (HCC)    Depression with anxiety    HTN (hypertension)    Hyperlipidemia    UTI (lower urinary tract infection)     Past Surgical History:  Procedure Laterality Date   DENTAL SURGERY     pilonidial cyst     TONSILLECTOMY       reports that she quit smoking about 8 years ago. Her smoking use included cigarettes. She started smoking about 63 years ago. She has a 41.3 pack-year smoking history. She has never used smokeless tobacco. She reports that she does not currently use alcohol. She reports that she does not use drugs.  Allergies  Allergen Reactions   Lipitor [Atorvastatin] Other (  See Comments)    Myalgias    Family History  Problem Relation Age of Onset   Alcohol abuse Other    Arthritis Other    Heart disease Other    Stroke Other    Hypertension Other    Hypertension Father    Stroke Father    Atrial fibrillation Brother    Other Mother        unsure of medical history   Colon cancer Neg Hx      Prior to Admission medications   Medication Sig Start Date End Date Taking? Authorizing Provider  acetaminophen (TYLENOL) 325 MG tablet Take 650 mg by mouth every 6 (six) hours as needed for mild pain or headache.   Yes [provider]  amLODipine (NORVASC) 5 MG tablet TAKE ONE TABLET BY MOUTH DAILY 10/29/22  Yes Ngetich, Dinah C, NP  buPROPion (WELLBUTRIN XL) 150 MG 24 hr tablet TAKE ONE TABLET BY MOUTH DAILY 10/29/22  Yes Ngetich, Dinah C, NP  Cholecalciferol (THERA-D 2000) 50 MCG (2000 UT) TABS 1 tab by mouth once daily 12/16/20  Yes Corwin Levins, MD  clonazePAM (KLONOPIN) 0.5 MG tablet Take 1 tablet (0.5 mg total) by mouth 2 (two) times daily as needed for anxiety. 04/08/23  Yes Ngetich, Dinah C, NP  clonazePAM (KLONOPIN) 0.5 MG tablet Take 1 tablet (0.5 mg total) by mouth daily. In the morning 04/23/23  Yes Ngetich, Dinah C, NP  DM-Phenylephrine-Acetaminophen (VICKS DAYQUIL COLD & FLU PO) Take 30 mLs by mouth every 4 (four) hours as needed (for cold and flu symptoms).   Yes [provider]  donepezil (ARICEPT) 5 MG tablet TAKE ONE TABLET BY MOUTH AT BEDTIME 10/29/22  Yes Ngetich, Dinah C, NP  FLUoxetine (PROZAC) 40 MG capsule TAKE ONE CAPSULE BY MOUTH DAILY 07/30/22  Yes Ngetich, Dinah C, NP  fluticasone (FLONASE) 50 MCG/ACT nasal spray USE TWO SPRAYS in each nostril ONCE DAILY. 08/14/21  Yes Corwin Levins, MD  folic acid (FOLVITE) 1 MG tablet TAKE ONE TABLET BY MOUTH DAILY 07/30/22  Yes Ngetich, Dinah C, NP  ipratropium (ATROVENT) 0.02 % nebulizer solution NEBULIZE CONTENTS OF 1 VIAL 3 TIMES A DAY 04/16/22  Yes Ngetich, Dinah C, NP  levalbuterol (XOPENEX) 0.63 MG/3ML nebulizer solution USE ONE vial via nebulization every SIX hours as needed FOR SHORTNESS OF BREATH OR wheezing. 06/23/21  Yes Ngetich, Dinah C, NP  melatonin 3 MG TABS tablet Take 3 mg by mouth at bedtime.   Yes [provider]  memantine (NAMENDA) 10 MG tablet TAKE ONE TABLET BY MOUTH TWICE DAILY 07/30/22   Yes Ngetich, Dinah C, NP  mirtazapine (REMERON) 15 MG tablet TAKE ONE TABLET BY MOUTH AT BEDTIME 07/30/22  Yes Ngetich, Dinah C, NP  montelukast (SINGULAIR) 10 MG tablet TAKE ONE TABLET BY MOUTH DAILY 10/29/22  Yes Ngetich, Dinah C, NP  Multiple Vitamin (MULTIVITAMIN WITH MINERALS) TABS tablet Take 1 tablet by mouth daily. 04/12/21  Yes Rodolph Bong, MD  Nutritional Supplements (ENSURE ACTIVE HIGH PROTEIN) LIQD Take 1 Can by mouth daily. 01/11/23  Yes Ngetich, Dinah C, NP  omeprazole (PRILOSEC) 20 MG capsule Take 2 capsules (40 mg total) by mouth daily as needed. Patient taking differently: Take 40 mg by mouth daily as needed (for GERD). 10/31/22  Yes Ngetich, Dinah C, NP  Phenylephrine-Doxylamine-DM (NYQUIL COUGH DM + CONGESTION PO) Take 30 mLs by mouth at bedtime as needed (for cold and flu symptoms).   Yes [provider]  rosuvastatin (CRESTOR) 20  MG tablet TAKE ONE TABLET BY MOUTH DAILY 10/29/22  Yes Ngetich, Dinah C, NP  thiamine 100 MG tablet Take 1 tablet (100 mg total) by mouth daily. 04/17/21  Yes Ngetich, Dinah C, NP  TRELEGY ELLIPTA 200-62.5-25 MCG/ACT AEPB INHALE 1 PUFF INTO THE LUNGS ONCE DAILY AS DIRECTED. 08/30/22  Yes Ngetich, Dinah C, NP  vitamin B-12 (CYANOCOBALAMIN) 1000 MCG tablet Take 1 tablet (1,000 mcg total) by mouth daily. 04/11/21  Yes Rodolph Bong, MD  Respiratory Therapy Supplies (FLUTTER) DEVI Use as directed 08/19/17   Chilton Greathouse, MD     Physical Exam: Vitals:   05/08/23 0030 05/08/23 0100 05/08/23 0130 05/08/23 0605  BP: 98/87 110/76 103/83 107/68  Pulse: (!) 103 (!) 104 (!) 108 98  Resp: (!) 22 15 (!) 22 10  Temp:      TempSrc:      SpO2: 94% 96% 91% 98%  Weight:      Height:        Physical Exam Constitutional:      General: She is not in acute distress.    Appearance: She is not ill-appearing.  HENT:     Nose: Nose normal.  Eyes:     Pupils: Pupils are equal, round, and reactive to light.  Cardiovascular:     Rate and Rhythm:  Regular rhythm. Tachycardia present.     Pulses: Normal pulses.     Heart sounds: Normal heart sounds.  Pulmonary:     Effort: Pulmonary effort is normal.     Breath sounds: Normal breath sounds.  Abdominal:     General: Bowel sounds are normal.  Musculoskeletal:        General: No deformity.     Cervical back: Normal range of motion and neck supple.     Right lower leg: No edema.     Left lower leg: No edema.     Comments: Right-sided upper extremity point tenderness around medial and lateral groin and thigh area.  Skin:    General: Skin is dry.     Capillary Refill: Capillary refill takes less than 2 seconds.  Neurological:     Mental Status: She is oriented to person, place, and time.  Psychiatric:        Mood and Affect: Mood normal.      Labs on Admission: I have personally reviewed following labs and imaging studies  CBC: Recent Labs  Lab 05/07/23 2058  WBC 14.9*  NEUTROABS 12.4*  HGB 13.4  HCT 41.9  MCV 91.7  PLT 274   Basic Metabolic Panel: Recent Labs  Lab 05/01/23 1532 05/07/23 2058  NA 140 137  K 4.5 4.7  CL 100 99  CO2 32 30  GLUCOSE 75 94  BUN 20 17  CREATININE 1.30* 1.10*  CALCIUM 9.3 9.4   GFR: Estimated Creatinine Clearance: 32.8 mL/min (A) (by C-G formula based on SCr of 1.1 mg/dL (H)). Liver Function Tests: Recent Labs  Lab 05/07/23 2058  AST 30  ALT 25  ALKPHOS 69  BILITOT 0.7  PROT 6.9  ALBUMIN 3.3*   No results for input(s): "LIPASE", "AMYLASE" in the last 168 hours. No results for input(s): "AMMONIA" in the last 168 hours. Coagulation Profile: No results for input(s): "INR", "PROTIME" in the last 168 hours. Cardiac Enzymes: No results for input(s): "CKTOTAL", "CKMB", "CKMBINDEX", "TROPONINI", "TROPONINIHS" in the last 168 hours. BNP (last 3 results) No results for input(s): "BNP" in the last 8760 hours. HbA1C: No results for input(s): "HGBA1C" in the last  72 hours. CBG: No results for input(s): "GLUCAP" in the last 168  hours. Lipid Profile: No results for input(s): "CHOL", "HDL", "LDLCALC", "TRIG", "CHOLHDL", "LDLDIRECT" in the last 72 hours. Thyroid Function Tests: No results for input(s): "TSH", "T4TOTAL", "FREET4", "T3FREE", "THYROIDAB" in the last 72 hours. Anemia Panel: No results for input(s): "VITAMINB12", "FOLATE", "FERRITIN", "TIBC", "IRON", "RETICCTPCT" in the last 72 hours. Urine analysis:    Component Value Date/Time   COLORURINE YELLOW 05/07/2023 2311   APPEARANCEUR CLEAR 05/07/2023 2311   LABSPEC 1.014 05/07/2023 2311   PHURINE 6.0 05/07/2023 2311   GLUCOSEU NEGATIVE 05/07/2023 2311   GLUCOSEU 100 (A) 03/20/2021 1556   HGBUR NEGATIVE 05/07/2023 2311   HGBUR negative 06/20/2010 1406   BILIRUBINUR NEGATIVE 05/07/2023 2311   BILIRUBINUR Negative 04/03/2021 1536   KETONESUR NEGATIVE 05/07/2023 2311   PROTEINUR NEGATIVE 05/07/2023 2311   UROBILINOGEN negative (A) 04/03/2021 1536   UROBILINOGEN 0.2 03/20/2021 1556   NITRITE NEGATIVE 05/07/2023 2311   LEUKOCYTESUR NEGATIVE 05/07/2023 2311    Radiological Exams on Admission: I have personally reviewed images CT PELVIS WO CONTRAST  Result Date: 05/08/2023 CLINICAL DATA:  Hip trauma fall EXAM: CT PELVIS WITHOUT CONTRAST TECHNIQUE: Multidetector CT imaging of the pelvis was performed following the standard protocol without intravenous contrast. RADIATION DOSE REDUCTION: This exam was performed according to the departmental dose-optimization program which includes automated exposure control, adjustment of the mA and/or kV according to patient size and/or use of iterative reconstruction technique. COMPARISON:  05/07/2023 FINDINGS: Urinary Tract:  Urinary bladder is unremarkable Bowel: No acute bowel wall thickening. Diverticular disease of the sigmoid colon. Vascular/Lymphatic: Aortic atherosclerosis. No aneurysm. No suspicious lymph nodes Reproductive:  Uterus unremarkable.  No adnexal mass. Other:  Negative for pelvic effusion or free air.  Musculoskeletal: Acute nondisplaced right superior and inferior pubic rami fractures. Acute mildly impacted subcapital right femoral neck fracture. No femoral head dislocation. Pubic symphysis appears intact. Mild asymmetric enlargement of the right operator muscles consistent with soft tissue injury. IMPRESSION: 1. Acute nondisplaced right superior and inferior pubic rami fractures. 2. Acute mildly impacted subcapital right femoral neck fracture. 3. Aortic atherosclerosis. Aortic Atherosclerosis (ICD10-I70.0). Electronically Signed   By: Jasmine Pang M.D.   On: 05/08/2023 00:27   CT Lumbar Spine Wo Contrast  Result Date: 05/07/2023 CLINICAL DATA:  Trauma EXAM: CT LUMBAR SPINE WITHOUT CONTRAST TECHNIQUE: Multidetector CT imaging of the lumbar spine was performed without intravenous contrast administration. Multiplanar CT image reconstructions were also generated. RADIATION DOSE REDUCTION: This exam was performed according to the departmental dose-optimization program which includes automated exposure control, adjustment of the mA and/or kV according to patient size and/or use of iterative reconstruction technique. COMPARISON:  None Available. FINDINGS: Segmentation: 5 lumbar type vertebrae. Alignment: Grade 1 retrolisthesis at L1-2 and L2-3. Vertebrae: Wedge compression fracture of T10 with 50% height loss. This is chronic. No acute fracture. Paraspinal and other soft tissues: Calcific aortic atherosclerosis. Disc levels: No bony spinal canal stenosis.  No neural impingement. IMPRESSION: 1. No acute fracture or static subluxation of the lumbar spine. 2. Chronic wedge compression fracture of T10 with 50% height loss. Aortic Atherosclerosis (ICD10-I70.0). Electronically Signed   By: Deatra Robinson M.D.   On: 05/07/2023 23:53   DG Pelvis 1-2 Views  Result Date: 05/07/2023 CLINICAL DATA:  Pain EXAM: PELVIS - 1-2 VIEW COMPARISON:  None Available. FINDINGS: The bones are osteopenic. Right hip is slightly rotated  which limits evaluation. There are findings suspicious for nondisplaced fracture of the right superior pubic ramus.  There is no dislocation. Soft tissues are within normal limits. IMPRESSION: Findings suspicious for nondisplaced fracture of the right superior pubic ramus. Limited evaluation of the right hip secondary to rotation. Recommend dedicated hip x-ray for further evaluation if clinically warranted. Electronically Signed   By: Darliss Cheney M.D.   On: 05/07/2023 22:30     Assessment/Plan: Principal Problem:   Pelvic fracture (HCC) Active Problems:   Closed fracture of multiple pubic rami, right, initial encounter (HCC)   Closed displaced fracture of right femoral neck (HCC)   Hyperlipidemia   Essential hypertension   CKD stage 3a, GFR 45-59 ml/min (HCC)   B12 deficiency   History of COPD   GAD (generalized anxiety disorder)   Fall at nursing home.    Assessment and Plan: Acute nondisplaced right superior and inferior pubic rami fracture Acute right femoral neck fracture Fall from chair at nursing home -Patient presenting with complaining of fall slipping from chair.  Patient denies any losing consciousness or hitting of head. - CT pelvis showed acute nondisplaced right superior and inferior pubic rami fracture and acute mildly impacted right femoral neck fracture. -CT lumbar spine chronic wage compression fracture at T10 level with 50% height loss.  No other subluxation or fracture of lumbar vertebra - X-ray pelvis echo finding mention in the CT scan. -ED physician consulted discussed case with orthopedic Dr. Linna Caprice, will see patient today. -Keeping patient n.p.o. for Ortho intervention today. - Continue maintenance fluid D5 LR 75 cc/h. -Continue Tylenol as needed for mild pain and Dilaudid 0.5 to 1 mg every 2 hours as needed for moderate and severe pain.  Essential hypertension -Patient's blood pressure within good range except 1 episode patient blood pressure dropped to  98/87.  Given patient blood pressure is borderline soft holding home amlodipine 5 mg daily.  Reactive leukocytosis - WBC count 14.9.  Patient is afebrile.  No evidence of infection at this time.  Reactive leukocytosis in the setting of stress in the context of fracture.  Reflex tachycardia - Patient heart rate around 90-103.  Obtaining EKG.  Bedside cardiac monitoring showing sinus tachycardia.  Repeat tachycardia in the setting of pain response.  Hyperlipidemia -Not currently on any antihyperlipidemic agent.  CKD stage 3a -Creatinine 1.1.  GFR 51-53.  Persistent baseline neck - Continue to monitor  Hyperlipidemia - Continue Crestor 20 mg daily.  History of COPD -Stable - Patient reported use oxygen at bedtime. - Continue to check pulse ox and supplemental oxygen at bedtime to keep O2 sat above 94% - Continue Xopenex as needed for any wheezing or shortness of breath - Continue singular 10 mg daily and Breo Ellipta and Incruse Ellipta once daily.  History of dementia - Continue donepezil 5 mg at bedtime and memantine 10 twice daily.  Generalized anxiety disorder -Continue bupropion 150 mg daily, Prozac 40 mg daily, and Remeron  B12 deficiency - Continue vitamin B12 supplement once daily  DVT prophylaxis:  SCDs.  Deferring pharmacological prophylaxis as patient possibly going to have orthopedic procedure later today Code Status:  DNR/DNI(Do NOT Intubate).   Diet: Currently n.p.o. Family Communication: There is no family member at bedside now. Disposition Plan: Pending clinical course.  Possibly patient is going to have orthopedic intervention and after return to her skilled nursing facility. Consults: PT and OT Admission status:   Inpatient, Telemetry bed  Severity of Illness: The appropriate patient status for this patient is INPATIENT. Inpatient status is judged to be reasonable and necessary in order to provide the  required intensity of service to ensure the patient's  safety. The patient's presenting symptoms, physical exam findings, and initial radiographic and laboratory data in the context of their chronic comorbidities is felt to place them at high risk for further clinical deterioration. Furthermore, it is not anticipated that the patient will be medically stable for discharge from the hospital within 2 midnights of admission.   * I certify that at the point of admission it is my clinical judgment that the patient will require inpatient hospital care spanning beyond 2 midnights from the point of admission due to high intensity of service, high risk for further deterioration and high frequency of surveillance required.Marland Kitchen    Tereasa Coop, MD [facility]  How to contact the [facility] Attending or Consulting provider 7A - 7P or covering provider during after hours 7P -7A, for this patient.  Check the care team in [facility] and look for a) attending/consulting [facility] provider listed and b) the [facility] team listed Log into [email or web address] and use [facility]'s universal password to access. If you do not have the password, please contact the hospital operator. Locate the [facility] provider you are looking for under [facility] and page to a number that you can be directly reached. If you still have difficulty reaching the provider, please page the [facility] (Director on Call) for the Hospitalists listed on amion for assistance.  05/08/2023, 6:33 AM

## 2023-05-08 NOTE — Progress Notes (Signed)
OT Cancellation Note  Patient Details Name: ERSULA COMBEST MRN: [id number] DOB: 09-01-1945   Cancelled Treatment:    Reason Eval/Treat Not Completed: Medical issues which prohibited therapy. Per chart, plan for surgical intervention for ORIF to R hip fx. Will follow up after procedure.    Tyler Deis, OTR/L [facility] Acute Rehabilitation Office: [phone]   Myrla Halsted 05/08/2023, 3:47 PM

## 2023-05-08 NOTE — Progress Notes (Addendum)
Brief same day note:  Patient is a 77 year old female with history of hypertension, GAD, dementia, CKD stage III, COPD, vitamin B12 deficiency  who presented from skilled nursing facility after a fall resulting in pain on bilateral hip.  No history of head injury.  On presenation, patient was hemodynamically stable with mild sinus tachycardia.  Lab work showed WC count of 14.9.  CT of the pelvis showed acute nondisplaced right superior and inferior rami fracture, acute mildly impacted subcapital right femoral neck fracture.  Orthopedics consulted.  Dr. Linna Caprice planning for ORIF tomorrow [facility].  Patient will be transferred to [facility]  Patient seen and examined the bedside today.  She is hemodynamically stable.  Overall looks comfortable, lying on bed.  Complains of pain on the right hip but not in distress.  Assessment and plan:  Acute right femoral neck fracture/right superior and inferior pubic rami fracture: Report of slipping from the chair.  No history of head injury.  CT imagings as above.  CT lumbar spine also showed chronic wedge compression fracture at T10 level with 50% height loss.  Orthopedics following, plan for ORIF tomorrow. Continue pain management, supportive care.  DVT prophylaxis with SCD for now.  PT/OT evaluation after surgery. At baseline ,she ambulates without any problem.  She lives at ALF  Hypertension: Currently blood pressure soft.  Currently home medication amlodipine on hold  Leukocytosis: Most likely reactive.  No infectious signs.  Continue to monitor  CKD stage IIIa: Currently kidney function at baseline  Hyperlipidemia: On Crestor  History of COPD/chronic hypoxic respiratory failure: On 2 L of oxygen at home at night only.  Currently not on exacerbation.  Takes Singulair, Breo Ellipta, Incruse Ellipta.  Continue bronchodilators as needed  History of dementia: On donepezil, memantine.  Continue delirium precaution, frequent reorientation.  Alert and oriented this  morning  History of GAD: On bupropion, Prozac, Remeron  Vitamin B12 deficiency: continue supplementation  I called her son Chrissie Noa to update about transfer plan to [facility] and  discuss about our management plan.

## 2023-05-08 NOTE — Plan of Care (Signed)
  Problem: Education: Goal: Knowledge of General Education information will improve Description: Including pain rating scale, medication(s)/side effects and non-pharmacologic comfort measures Outcome: Progressing   Problem: Health Behavior/Discharge Planning: Goal: Ability to manage health-related needs will improve Outcome: Progressing   Problem: Clinical Measurements: Goal: Ability to maintain clinical measurements within normal limits will improve Outcome: Progressing Goal: Will remain free from infection Outcome: Progressing Goal: Diagnostic test results will improve Outcome: Progressing Goal: Respiratory complications will improve Outcome: Progressing Goal: Cardiovascular complication will be avoided Outcome: Progressing   Problem: Activity: Goal: Risk for activity intolerance will decrease Outcome: Progressing   Problem: Nutrition: Goal: Adequate nutrition will be maintained Outcome: Progressing   Problem: Coping: Goal: Level of anxiety will decrease Outcome: Progressing   Problem: Elimination: Goal: Will not experience complications related to urinary retention Outcome: Progressing   Problem: Safety: Goal: Ability to remain free from injury will improve Outcome: Progressing   Problem: Skin Integrity: Goal: Risk for impaired skin integrity will decrease Outcome: Progressing   

## 2023-05-09 ENCOUNTER — Inpatient Hospital Stay (HOSPITAL_COMMUNITY): Payer: Medicare HMO

## 2023-05-09 ENCOUNTER — Inpatient Hospital Stay (HOSPITAL_COMMUNITY): Payer: Medicare HMO | Admitting: Anesthesiology

## 2023-05-09 ENCOUNTER — Encounter (HOSPITAL_COMMUNITY)
Admission: EM | Disposition: A | Discharge status: Skilled Nursing Facility | Payer: Self-pay | Source: Skilled Nursing Facility | Attending: Internal Medicine

## 2023-05-09 ENCOUNTER — Encounter (HOSPITAL_COMMUNITY): Payer: Self-pay | Admitting: Internal Medicine

## 2023-05-09 DIAGNOSIS — S72001A Fracture of unspecified part of neck of right femur, initial encounter for closed fracture: Secondary | ICD-10-CM | POA: Diagnosis not present

## 2023-05-09 DIAGNOSIS — S32501A Unspecified fracture of right pubis, initial encounter for closed fracture: Secondary | ICD-10-CM | POA: Diagnosis not present

## 2023-05-09 DIAGNOSIS — J4489 Other specified chronic obstructive pulmonary disease: Secondary | ICD-10-CM | POA: Diagnosis not present

## 2023-05-09 DIAGNOSIS — I2510 Atherosclerotic heart disease of native coronary artery without angina pectoris: Secondary | ICD-10-CM

## 2023-05-09 HISTORY — PX: TOTAL HIP ARTHROPLASTY: SHX124

## 2023-05-09 LAB — COMPREHENSIVE METABOLIC PANEL
ALT: 19 U/L (ref 0–44)
AST: 19 U/L (ref 15–41)
Albumin: 2.9 g/dL — ABNORMAL LOW (ref 3.5–5.0)
Alkaline Phosphatase: 54 U/L (ref 38–126)
Anion gap: 8 (ref 5–15)
BUN: 44 mg/dL — ABNORMAL HIGH (ref 8–23)
CO2: 24 mmol/L (ref 22–32)
Calcium: 8.1 mg/dL — ABNORMAL LOW (ref 8.9–10.3)
Chloride: 97 mmol/L — ABNORMAL LOW (ref 98–111)
Creatinine, Ser: 2.23 mg/dL — ABNORMAL HIGH (ref 0.44–1.00)
GFR, Estimated: 22 mL/min — ABNORMAL LOW (ref >60)
Glucose, Bld: 136 mg/dL — ABNORMAL HIGH (ref 70–99)
Potassium: 4.9 mmol/L (ref 3.5–5.1)
Sodium: 130 mmol/L — ABNORMAL LOW (ref 135–145)
Total Bilirubin: 0.3 mg/dL (ref 0.3–1.2)
Total Protein: 6 g/dL — ABNORMAL LOW (ref 6.5–8.1)

## 2023-05-09 LAB — CBC
HCT: 35.4 % — ABNORMAL LOW (ref 36.0–46.0)
Hemoglobin: 10.9 g/dL — ABNORMAL LOW (ref 12.0–15.0)
MCH: 28.6 pg (ref 26.0–34.0)
MCHC: 30.8 g/dL (ref 30.0–36.0)
MCV: 92.9 fL (ref 80.0–100.0)
Platelets: 205 10*3/uL (ref 150–400)
RBC: 3.81 MIL/uL — ABNORMAL LOW (ref 3.87–5.11)
RDW: 13.2 % (ref 11.5–15.5)
WBC: 11.8 10*3/uL — ABNORMAL HIGH (ref 4.0–10.5)
nRBC: 0 % (ref 0.0–0.2)

## 2023-05-09 LAB — TYPE AND SCREEN
ABO/RH(D): A POS
Antibody Screen: NEGATIVE

## 2023-05-09 LAB — ABO/RH: ABO/RH(D): A POS

## 2023-05-09 SURGERY — ARTHROPLASTY, HIP, TOTAL, ANTERIOR APPROACH
Anesthesia: General | Site: Hip | Laterality: Right

## 2023-05-09 MED ORDER — PROPOFOL 10 MG/ML IV BOLUS
INTRAVENOUS | Status: DC | PRN
  Administered 2023-05-09: 80 mg via INTRAVENOUS
  Administered 2023-05-09: 125 ug/kg/min via INTRAVENOUS

## 2023-05-09 MED ORDER — CHLORHEXIDINE GLUCONATE 0.12 % MT SOLN
15.0000 mL | Freq: Once | OROMUCOSAL | Status: AC
  Administered 2023-05-09: 15 mL via OROMUCOSAL

## 2023-05-09 MED ORDER — SODIUM CHLORIDE 0.9 % IV SOLN
2.0000 g | INTRAVENOUS | Status: DC
  Administered 2023-05-09 – 2023-05-12 (×4): 2 g via INTRAVENOUS
  Filled 2023-05-09 (×4): qty 20

## 2023-05-09 MED ORDER — DEXTROSE 5 % IV SOLN
500.0000 mg | INTRAVENOUS | Status: DC
  Administered 2023-05-09: 500 mg via INTRAVENOUS
  Filled 2023-05-09: qty 5

## 2023-05-09 MED ORDER — ONDANSETRON HCL 4 MG/2ML IJ SOLN
INTRAMUSCULAR | Status: DC | PRN
  Administered 2023-05-09: 4 mg via INTRAVENOUS

## 2023-05-09 MED ORDER — LACTATED RINGERS IV SOLN: INTRAVENOUS | Status: DC

## 2023-05-09 MED ORDER — CEFAZOLIN SODIUM-DEXTROSE 2-4 GM/100ML-% IV SOLN: 2.0000 g | Freq: Four times a day (QID) | INTRAVENOUS | Status: DC

## 2023-05-09 MED ORDER — ORAL CARE MOUTH RINSE: 15.0000 mL | Freq: Once | OROMUCOSAL | Status: AC

## 2023-05-09 MED ORDER — METHOCARBAMOL 1000 MG/10ML IJ SOLN
500.0000 mg | Freq: Four times a day (QID) | INTRAMUSCULAR | Status: DC | PRN
  Administered 2023-05-11: 500 mg via INTRAVENOUS
  Filled 2023-05-09: qty 10

## 2023-05-09 MED ORDER — MORPHINE SULFATE (PF) 2 MG/ML IV SOLN
0.5000 mg | INTRAVENOUS | Status: DC | PRN
  Administered 2023-05-11 – 2023-05-14 (×3): 1 mg via INTRAVENOUS
  Filled 2023-05-09 (×3): qty 1

## 2023-05-09 MED ORDER — FENTANYL CITRATE (PF) 100 MCG/2ML IJ SOLN
INTRAMUSCULAR | Status: DC | PRN
  Administered 2023-05-09 (×4): 50 ug via INTRAVENOUS

## 2023-05-09 MED ORDER — DOCUSATE SODIUM 100 MG PO CAPS
100.0000 mg | ORAL_CAPSULE | Freq: Two times a day (BID) | ORAL | Status: DC
  Administered 2023-05-09 – 2023-05-14 (×9): 100 mg via ORAL
  Filled 2023-05-09 (×10): qty 1

## 2023-05-09 MED ORDER — IPRATROPIUM-ALBUTEROL 0.5-2.5 (3) MG/3ML IN SOLN
3.0000 mL | Freq: Two times a day (BID) | RESPIRATORY_TRACT | Status: DC
  Administered 2023-05-10 – 2023-05-12 (×6): 3 mL via RESPIRATORY_TRACT
  Filled 2023-05-09 (×6): qty 3

## 2023-05-09 MED ORDER — BUPIVACAINE-EPINEPHRINE 0.25% -1:200000 IJ SOLN
INTRAMUSCULAR | Status: AC
  Filled 2023-05-09: qty 1

## 2023-05-09 MED ORDER — POLYETHYLENE GLYCOL 3350 17 G PO PACK
17.0000 g | PACK | Freq: Every day | ORAL | Status: DC | PRN
  Administered 2023-05-10: 17 g via ORAL
  Filled 2023-05-09: qty 1

## 2023-05-09 MED ORDER — OXYCODONE HCL 5 MG/5ML PO SOLN: 5.0000 mg | Freq: Once | ORAL | Status: DC | PRN

## 2023-05-09 MED ORDER — HYDROCODONE-ACETAMINOPHEN 5-325 MG PO TABS
1.0000 | ORAL_TABLET | ORAL | Status: DC | PRN
Start: 2023-05-09 — End: 2023-05-14
  Administered 2023-05-10 – 2023-05-11 (×4): 2 via ORAL
  Administered 2023-05-11: 1 via ORAL
  Administered 2023-05-12: 2 via ORAL
  Filled 2023-05-09 (×6): qty 2

## 2023-05-09 MED ORDER — CEFAZOLIN SODIUM-DEXTROSE 1-4 GM/50ML-% IV SOLN
1.0000 g | Freq: Once | INTRAVENOUS | Status: AC
  Administered 2023-05-10: 1 g via INTRAVENOUS
  Filled 2023-05-09: qty 50

## 2023-05-09 MED ORDER — ASPIRIN 325 MG PO TBEC
325.0000 mg | DELAYED_RELEASE_TABLET | Freq: Every day | ORAL | Status: DC
  Administered 2023-05-09 – 2023-05-14 (×6): 325 mg via ORAL
  Filled 2023-05-09 (×6): qty 1

## 2023-05-09 MED ORDER — FENTANYL CITRATE PF 50 MCG/ML IJ SOSY: 25.0000 ug | PREFILLED_SYRINGE | INTRAMUSCULAR | Status: DC | PRN

## 2023-05-09 MED ORDER — FENTANYL CITRATE (PF) 100 MCG/2ML IJ SOLN
INTRAMUSCULAR | Status: AC
  Filled 2023-05-09: qty 2

## 2023-05-09 MED ORDER — OXYCODONE HCL 5 MG PO TABS: 5.0000 mg | ORAL_TABLET | Freq: Four times a day (QID) | ORAL | Status: DC | PRN

## 2023-05-09 MED ORDER — BUPIVACAINE-EPINEPHRINE 0.25% -1:200000 IJ SOLN
INTRAMUSCULAR | Status: DC | PRN
  Administered 2023-05-09: 30 mL

## 2023-05-09 MED ORDER — ONDANSETRON HCL 4 MG/2ML IJ SOLN: 4.0000 mg | Freq: Once | INTRAMUSCULAR | Status: DC | PRN

## 2023-05-09 MED ORDER — BISACODYL 10 MG RE SUPP: 10.0000 mg | Freq: Every day | RECTAL | Status: DC | PRN

## 2023-05-09 MED ORDER — POVIDONE-IODINE 10 % EX SWAB: 2.0000 | Freq: Once | CUTANEOUS | Status: DC

## 2023-05-09 MED ORDER — PROPOFOL 500 MG/50ML IV EMUL
INTRAVENOUS | Status: AC
  Filled 2023-05-09: qty 50

## 2023-05-09 MED ORDER — ROCURONIUM BROMIDE 10 MG/ML (PF) SYRINGE
PREFILLED_SYRINGE | INTRAVENOUS | Status: DC | PRN
  Administered 2023-05-09: 40 mg via INTRAVENOUS
  Administered 2023-05-09: 10 mg via INTRAVENOUS

## 2023-05-09 MED ORDER — SODIUM CHLORIDE 0.9 % IV SOLN
INTRAVENOUS | Status: DC
  Administered 2023-05-09: 75 mL/h via INTRAVENOUS

## 2023-05-09 MED ORDER — DEXAMETHASONE SODIUM PHOSPHATE 10 MG/ML IJ SOLN
INTRAMUSCULAR | Status: DC | PRN
  Administered 2023-05-09: 4 mg via INTRAVENOUS

## 2023-05-09 MED ORDER — WATER FOR IRRIGATION, STERILE IR SOLN
Status: DC | PRN
  Administered 2023-05-09: 1000 mL

## 2023-05-09 MED ORDER — ACETAMINOPHEN 500 MG PO TABS
1000.0000 mg | ORAL_TABLET | Freq: Once | ORAL | Status: AC
Start: 2023-05-09 — End: 2023-05-09
  Administered 2023-05-09: 1000 mg via ORAL
  Filled 2023-05-09: qty 2

## 2023-05-09 MED ORDER — ISOPROPYL ALCOHOL 70 % SOLN
Status: DC | PRN
  Administered 2023-05-09: 1 via TOPICAL

## 2023-05-09 MED ORDER — SODIUM CHLORIDE 0.9 % IR SOLN
Status: DC | PRN
  Administered 2023-05-09: 1000 mL
  Administered 2023-05-09: 250 mL
  Administered 2023-05-09: 1000 mL

## 2023-05-09 MED ORDER — CHLORHEXIDINE GLUCONATE 4 % EX SOLN: 60.0000 mL | Freq: Once | CUTANEOUS | Status: DC

## 2023-05-09 MED ORDER — TRANEXAMIC ACID-NACL 1000-0.7 MG/100ML-% IV SOLN
1000.0000 mg | INTRAVENOUS | Status: AC
  Administered 2023-05-09: 1000 mg via INTRAVENOUS
  Filled 2023-05-09: qty 100

## 2023-05-09 MED ORDER — OXYCODONE HCL 5 MG PO TABS
5.0000 mg | ORAL_TABLET | Freq: Once | ORAL | Status: DC | PRN
Start: 2023-05-09 — End: 2023-05-09

## 2023-05-09 MED ORDER — KETOROLAC TROMETHAMINE 30 MG/ML IJ SOLN
INTRAMUSCULAR | Status: DC | PRN
  Administered 2023-05-09: 30 mg

## 2023-05-09 MED ORDER — METHOCARBAMOL 500 MG PO TABS
500.0000 mg | ORAL_TABLET | Freq: Four times a day (QID) | ORAL | Status: DC | PRN
  Administered 2023-05-11 – 2023-05-14 (×6): 500 mg via ORAL
  Filled 2023-05-09 (×6): qty 1

## 2023-05-09 MED ORDER — PHENYLEPHRINE 80 MCG/ML (10ML) SYRINGE FOR IV PUSH (FOR BLOOD PRESSURE SUPPORT)
PREFILLED_SYRINGE | INTRAVENOUS | Status: DC | PRN
  Administered 2023-05-09: 80 ug via INTRAVENOUS

## 2023-05-09 MED ORDER — KETOROLAC TROMETHAMINE 30 MG/ML IJ SOLN
INTRAMUSCULAR | Status: AC
  Filled 2023-05-09: qty 1

## 2023-05-09 MED ORDER — LIDOCAINE 2% (20 MG/ML) 5 ML SYRINGE
INTRAMUSCULAR | Status: DC | PRN
  Administered 2023-05-09: 80 mg via INTRAVENOUS

## 2023-05-09 MED ORDER — CHLORHEXIDINE GLUCONATE CLOTH 2 % EX PADS
6.0000 | MEDICATED_PAD | Freq: Every day | CUTANEOUS | Status: DC
  Administered 2023-05-10 – 2023-05-11 (×2): 6 via TOPICAL

## 2023-05-09 MED ORDER — HYDROCODONE-ACETAMINOPHEN 7.5-325 MG PO TABS
1.0000 | ORAL_TABLET | ORAL | Status: DC | PRN
Start: 2023-05-09 — End: 2023-05-14
  Administered 2023-05-09: 1 via ORAL
  Administered 2023-05-12: 2 via ORAL
  Administered 2023-05-12: 1 via ORAL
  Administered 2023-05-12: 2 via ORAL
  Administered 2023-05-12 – 2023-05-13 (×2): 1 via ORAL
  Administered 2023-05-13: 2 via ORAL
  Administered 2023-05-13 (×2): 1 via ORAL
  Administered 2023-05-14 (×3): 2 via ORAL
  Filled 2023-05-09 (×4): qty 2
  Filled 2023-05-09: qty 1
  Filled 2023-05-09: qty 2
  Filled 2023-05-09: qty 1
  Filled 2023-05-09: qty 2
  Filled 2023-05-09: qty 1
  Filled 2023-05-09: qty 2
  Filled 2023-05-09: qty 1
  Filled 2023-05-09: qty 2

## 2023-05-09 MED ORDER — IPRATROPIUM-ALBUTEROL 0.5-2.5 (3) MG/3ML IN SOLN
3.0000 mL | Freq: Four times a day (QID) | RESPIRATORY_TRACT | Status: DC
  Administered 2023-05-09: 3 mL via RESPIRATORY_TRACT
  Filled 2023-05-09: qty 3

## 2023-05-09 MED ORDER — LACTATED RINGERS IV SOLN
INTRAVENOUS | Status: DC
  Administered 2023-05-09: 50 mL/h via INTRAVENOUS

## 2023-05-09 MED ORDER — METOCLOPRAMIDE HCL 5 MG/ML IJ SOLN: 5.0000 mg | Freq: Three times a day (TID) | INTRAMUSCULAR | Status: DC | PRN

## 2023-05-09 MED ORDER — METOCLOPRAMIDE HCL 5 MG PO TABS: 5.0000 mg | ORAL_TABLET | Freq: Three times a day (TID) | ORAL | Status: DC | PRN

## 2023-05-09 MED ORDER — ACETAMINOPHEN 500 MG PO TABS: 1000.0000 mg | ORAL_TABLET | Freq: Once | ORAL | Status: DC

## 2023-05-09 MED ORDER — METHYLPREDNISOLONE SODIUM SUCC 40 MG IJ SOLR
40.0000 mg | Freq: Once | INTRAMUSCULAR | Status: AC
  Administered 2023-05-09: 40 mg via INTRAVENOUS
  Filled 2023-05-09: qty 1

## 2023-05-09 MED ORDER — PHENOL 1.4 % MT LIQD: 1.0000 | OROMUCOSAL | Status: DC | PRN

## 2023-05-09 MED ORDER — PREDNISONE 20 MG PO TABS
20.0000 mg | ORAL_TABLET | Freq: Every day | ORAL | Status: DC
  Filled 2023-05-09: qty 1

## 2023-05-09 MED ORDER — SODIUM CHLORIDE (PF) 0.9 % IJ SOLN
INTRAMUSCULAR | Status: DC | PRN
  Administered 2023-05-09: 30 mL via INTRAVENOUS

## 2023-05-09 MED ORDER — CEFAZOLIN SODIUM-DEXTROSE 2-4 GM/100ML-% IV SOLN
2.0000 g | INTRAVENOUS | Status: AC
  Administered 2023-05-09: 2 g via INTRAVENOUS
  Filled 2023-05-09: qty 100

## 2023-05-09 MED ORDER — MENTHOL 3 MG MT LOZG: 1.0000 | LOZENGE | OROMUCOSAL | Status: DC | PRN

## 2023-05-09 MED ORDER — SODIUM CHLORIDE (PF) 0.9 % IJ SOLN
INTRAMUSCULAR | Status: AC
  Filled 2023-05-09: qty 50

## 2023-05-09 SURGICAL SUPPLY — 55 items
ADH SKN CLS APL DERMABOND .7 (GAUZE/BANDAGES/DRESSINGS) ×2
APL PRP STRL LF DISP 70% ISPRP (MISCELLANEOUS) ×1
BAG COUNTER SPONGE SURGICOUNT (BAG) IMPLANT
BAG SPEC THK2 15X12 ZIP CLS (MISCELLANEOUS) ×1
BAG SPNG CNTER NS LX DISP (BAG) ×1
BAG ZIPLOCK 12X15 (MISCELLANEOUS) IMPLANT
CHLORAPREP W/TINT 26 (MISCELLANEOUS) ×1 IMPLANT
COVER PERINEAL POST (MISCELLANEOUS) ×1 IMPLANT
COVER SURGICAL LIGHT HANDLE (MISCELLANEOUS) ×1 IMPLANT
DERMABOND ADVANCED .7 DNX12 (GAUZE/BANDAGES/DRESSINGS) ×2 IMPLANT
DRAPE IMP U-DRAPE 54X76 (DRAPES) ×1 IMPLANT
DRAPE SHEET LG 3/4 BI-LAMINATE (DRAPES) ×3 IMPLANT
DRAPE STERI IOBAN 125X83 (DRAPES) ×1 IMPLANT
DRAPE U-SHAPE 47X51 STRL (DRAPES) ×1 IMPLANT
DRSG AQUACEL AG ADV 3.5X10 (GAUZE/BANDAGES/DRESSINGS) ×1 IMPLANT
ELECT REM PT RETURN 15FT ADLT (MISCELLANEOUS) ×1 IMPLANT
GAUZE SPONGE 4X4 12PLY STRL (GAUZE/BANDAGES/DRESSINGS) ×1 IMPLANT
GLOVE BIO SURGEON STRL SZ7 (GLOVE) ×1 IMPLANT
GLOVE BIO SURGEON STRL SZ8.5 (GLOVE) ×2 IMPLANT
GLOVE BIOGEL PI IND STRL 7.5 (GLOVE) ×1 IMPLANT
GLOVE BIOGEL PI IND STRL 8.5 (GLOVE) ×1 IMPLANT
GOWN SPEC L3 XXLG W/TWL (GOWN DISPOSABLE) ×1 IMPLANT
GOWN STRL REUS W/ TWL XL LVL3 (GOWN DISPOSABLE) ×1 IMPLANT
GOWN STRL REUS W/TWL XL LVL3 (GOWN DISPOSABLE) ×1
HANDPIECE INTERPULSE COAX TIP (DISPOSABLE) ×1
HEAD MOD COCR 28MM HD -6MM NK (Orthopedic Implant) IMPLANT
HOLDER FOLEY CATH W/STRAP (MISCELLANEOUS) ×1 IMPLANT
HOOD PEEL AWAY T7 (MISCELLANEOUS) ×3 IMPLANT
KIT TURNOVER KIT A (KITS) IMPLANT
MANIFOLD NEPTUNE II (INSTRUMENTS) ×1 IMPLANT
MARKER SKIN DUAL TIP RULER LAB (MISCELLANEOUS) ×1 IMPLANT
NDL SAFETY ECLIPSE 18X1.5 (NEEDLE) ×1 IMPLANT
NDL SPNL 18GX3.5 QUINCKE PK (NEEDLE) ×1 IMPLANT
NEEDLE SPNL 18GX3.5 QUINCKE PK (NEEDLE) ×1 IMPLANT
PACK ANTERIOR HIP CUSTOM (KITS) ×1 IMPLANT
RINGBLOC BI POLAR 28X45MM (Orthopedic Implant) ×1 IMPLANT
SAW OSC TIP CART 19.5X105X1.3 (SAW) ×1 IMPLANT
SEALER BIPOLAR AQUA 6.0 (INSTRUMENTS) ×1 IMPLANT
SET HNDPC FAN SPRY TIP SCT (DISPOSABLE) ×1 IMPLANT
SHELL RINGBLOC BI POLR 28X45MM (Orthopedic Implant) IMPLANT
SOLUTION PRONTOSAN WOUND 350ML (IRRIGATION / IRRIGATOR) ×1 IMPLANT
SPIKE FLUID TRANSFER (MISCELLANEOUS) ×1 IMPLANT
STAPLER VISISTAT 35W (STAPLE) IMPLANT
STEM FEM CMTLS HO 12 144 (Stem) IMPLANT
SUT MNCRL AB 3-0 PS2 18 (SUTURE) ×1 IMPLANT
SUT MON AB 2-0 CT1 36 (SUTURE) ×1 IMPLANT
SUT STRATAFIX PDO 1 14 VIOLET (SUTURE) ×1
SUT STRATFX PDO 1 14 VIOLET (SUTURE) ×1
SUT VIC AB 2-0 CT1 27 (SUTURE)
SUT VIC AB 2-0 CT1 TAPERPNT 27 (SUTURE) IMPLANT
SUTURE STRATFX PDO 1 14 VIOLET (SUTURE) ×1 IMPLANT
SYR 3ML LL SCALE MARK (SYRINGE) ×1 IMPLANT
TRAY FOLEY MTR SLVR 16FR STAT (SET/KITS/TRAYS/PACK) IMPLANT
TUBE SUCTION HIGH CAP CLEAR NV (SUCTIONS) ×1 IMPLANT
WATER STERILE IRR 1000ML POUR (IV SOLUTION) ×1 IMPLANT

## 2023-05-09 NOTE — Transfer of Care (Signed)
Immediate Anesthesia Transfer of Care Note  Patient: Andrea Larson  Procedure(s) Performed: HEMI VS TOTAL HIP ARTHROPLASTY ANTERIOR APPROACH (Right: Hip)  Patient Location: PACU  Anesthesia Type:General  Level of Consciousness: drowsy  Airway & Oxygen Therapy: Patient Spontanous Breathing and Patient connected to face mask oxygen  Post-op Assessment: Report given to RN and Post -op Vital signs reviewed and stable  Post vital signs: Reviewed and stable  Last Vitals:  Vitals Value Taken Time  BP 136/66 05/09/23 1809  Temp    Pulse 93 05/09/23 1811  Resp 8 05/09/23 1811  SpO2 99 % 05/09/23 1811  Vitals shown include unfiled device data.  Last Pain:  Vitals:   05/09/23 1040  TempSrc:   PainSc: 10-Worst pain ever      Patients Stated Pain Goal: 0 (05/08/23 1937)  Complications: No notable events documented.

## 2023-05-09 NOTE — Plan of Care (Signed)
  Problem: Education: Goal: Knowledge of General Education information will improve Description: Including pain rating scale, medication(s)/side effects and non-pharmacologic comfort measures Outcome: Progressing   Problem: Health Behavior/Discharge Planning: Goal: Ability to manage health-related needs will improve Outcome: Progressing   Problem: Clinical Measurements: Goal: Ability to maintain clinical measurements within normal limits will improve Outcome: Progressing Goal: Will remain free from infection Outcome: Progressing Goal: Diagnostic test results will improve Outcome: Progressing Goal: Respiratory complications will improve Outcome: Progressing Goal: Cardiovascular complication will be avoided Outcome: Progressing   Problem: Activity: Goal: Risk for activity intolerance will decrease Outcome: Progressing   Problem: Nutrition: Goal: Adequate nutrition will be maintained Outcome: Progressing   Problem: Coping: Goal: Level of anxiety will decrease Outcome: Progressing   Problem: Elimination: Goal: Will not experience complications related to bowel motility Outcome: Progressing   Problem: Pain Management: Goal: General experience of comfort will improve Outcome: Progressing   Problem: Safety: Goal: Ability to remain free from injury will improve Outcome: Progressing   Problem: Skin Integrity: Goal: Risk for impaired skin integrity will decrease Outcome: Progressing

## 2023-05-09 NOTE — Anesthesia Preprocedure Evaluation (Addendum)
Anesthesia Evaluation  Patient identified by MRN, date of birth, ID band Patient awake    Reviewed: Allergy & Precautions, NPO status , Patient's Chart, lab work & pertinent test results  History of Anesthesia Complications Negative for: history of anesthetic complications  Airway Mallampati: II  TM Distance: >3 FB Neck ROM: Full    Dental  (+) Dental Advisory Given, Edentulous Upper   Pulmonary asthma , COPD,  COPD inhaler, former smoker  PRN O2 use    Pulmonary exam normal        Cardiovascular hypertension, Pt. on medications + CAD  Normal cardiovascular exam     Neuro/Psych  PSYCHIATRIC DISORDERS Anxiety Depression   Dementia negative neurological ROS     GI/Hepatic Neg liver ROS,GERD  Medicated and Controlled,,  Endo/Other   Na 130   Renal/GU CRFRenal disease     Musculoskeletal  (+) Arthritis ,    Abdominal   Peds  Hematology  (+) Blood dyscrasia, anemia   Anesthesia Other Findings   Reproductive/Obstetrics                              Anesthesia Physical Anesthesia Plan  ASA: 4  Anesthesia Plan: General   Post-op Pain Management: Tylenol PO (pre-op)*   Induction: Intravenous  PONV Risk Score and Plan: 3 and Treatment may vary due to age or medical condition, Ondansetron and TIVA  Airway Management Planned: Oral ETT  Additional Equipment: None  Intra-op Plan:   Post-operative Plan: Extubation in OR  Informed Consent: I have reviewed the patients History and Physical, chart, labs and discussed the procedure including the risks, benefits and alternatives for the proposed anesthesia with the patient or authorized representative who has indicated his/her understanding and acceptance.   Patient has DNR.  Discussed DNR with patient, Discussed DNR with power of attorney and Suspend DNR.   Dental advisory given and Consent reviewed with POA  Plan Discussed with:  CRNA and Anesthesiologist  Anesthesia Plan Comments:          Anesthesia Quick Evaluation

## 2023-05-09 NOTE — Progress Notes (Signed)
Patient arrives to [address] unit alert and oriented to person. VS obtained, telemetry initiated, and family oriented to room. Report given to on-coming RN. Haydee Salter, RN 05/09/23 7:39 PM

## 2023-05-09 NOTE — Interval H&P Note (Signed)
History and Physical Interval Note:  05/09/2023 2:37 PM  Andrea Larson  has presented today for surgery, with the diagnosis of hip fracture.  The various methods of treatment have been discussed with the patient and family. After consideration of risks, benefits and other options for treatment, the patient has consented to  Procedure(s): HEMI VS TOTAL HIP ARTHROPLASTY ANTERIOR APPROACH (Right) as a surgical intervention.  The patient's history has been reviewed, patient examined, no change in status, stable for surgery.  I have reviewed the patient's chart and labs.  Questions were answered to the patient's satisfaction.     Iline Oven Loriel Diehl

## 2023-05-09 NOTE — Progress Notes (Addendum)
PROGRESS NOTE  Andrea Larson  NGE:[id number] DOB: [birth year] DOA: 05/07/2023 PCP: Caesar Bookman, NP   Brief Narrative: Patient is a 77 year old female with history of hypertension, GAD, dementia, CKD stage III, COPD, vitamin B12 deficiency  who presented from skilled nursing facility after a fall resulting in pain on bilateral hip.  No history of head injury.  On presenation, patient was hemodynamically stable with mild sinus tachycardia.  Lab work showed WC count of 14.9.  CT of the pelvis showed acute nondisplaced right superior and inferior rami fracture, acute mildly impacted subcapital right femoral neck fracture.  Orthopedics consulted.  Dr. Linna Caprice planning for ORIF today at [facility].  Patient will be transferred to [facility]   Assessment & Plan:  Principal Problem:   Pelvic fracture (HCC) Active Problems:   Closed fracture of multiple pubic rami, right, initial encounter (HCC)   Closed displaced fracture of right femoral neck (HCC)   Hyperlipidemia   Essential hypertension   CKD stage 3a, GFR 45-59 ml/min (HCC)   B12 deficiency   History of COPD   GAD (generalized anxiety disorder)   Fall at nursing home.   Acute right femoral neck fracture/right superior and inferior pubic rami fracture: Report of slipping from the chair.  No history of head injury.  CT imagings as above.  CT lumbar spine also showed chronic wedge compression fracture at T10 level with 50% height loss.  Orthopedics following, plan for ORIF tomorrow. Continue pain management, supportive care.  DVT prophylaxis with SCD for now.  PT/OT evaluation after surgery. At baseline ,she ambulates without any problem.  She lives at ALF   Hypertension: Currently blood pressure soft.  Currently home medication amlodipine on hold.  Continue monitoring   Leukocytosis: Most likely reactive.  No infectious signs.  Continue to monitor, improving   AKI on CKD stage IIIa: Baseline creatinine around 1.2.  Kidney function bumped up to  the range of 2 today.  She was having issues with urine retention.  Foley has been placed.  Start on gentle fluids.  Continue monitoring kidney function, however no fluctuance.   Hyperlipidemia: On Crestor   History of COPD/chronic hypoxic respiratory failure: On 2 L of oxygen at home at night only.    Takes Singulair, Trelegy .  Found to be wheezing this morning, given a dose of IV steroid, started on low-dose prednisone from tomorrow.  Continue DuoNeb.  Continue incentive spirometer.  Checking chest x-ray this morning   History of dementia: On donepezil, memantine.  Continue delirium precaution, frequent reorientation.  Alert and oriented this morning   History of GAD: On bupropion, Prozac, Remeron   Vitamin B12 deficiency: continue supplementation  Addendum: Chest x-ray showed bilateral perihilar and interstitial opacities,suspicious for  atypical infection or bronchitis.Started on abx      DVT prophylaxis:SCDs Start: 05/08/23 0559     Code Status: Limited: Do not attempt resuscitation (DNR) -DNR-LIMITED -Do Not Intubate/DNI   Family Communication:: Discussed with son Chrissie Noa on phone on 10/31  Patient status: Inpatient  Patient is from : ALF  Anticipated discharge to: SNF  Estimated DC date: 2 to 3 days   Consultants: Orthopedics  Procedures: None yet  Antimicrobials:  Anti-infectives (From admission, onward)    None       Subjective: Patient seen and examined the bedside today.  Hemodynamically stable.  She denies any worsening shortness of breath or cough.  She was found to have mild wheezing this morning.  Foley has been placed.  Complains of  pain on the right hip.  [facility]ing [facility] be [facility]  Objective: Vitals:   05/09/23 0344 05/09/23 0838 05/09/23 1040 05/09/23 1108  BP: (!) 90/59 108/60  (!) 99/58  Pulse:  (!) 107  (!) 103  Resp:  18    Temp: 98.7 F (37.1 C) 97.8 F (36.6 C)    TempSrc: Oral     SpO2:  96% 91%   Weight:       Height:        Intake/Output Summary (Last 24 hours) at 05/09/2023 1110 Last data filed at 05/09/2023 0600 Gross per 24 hour  Intake 1219.42 ml  Output 270 ml  Net 949.42 ml   Filed Weights   05/07/23 1847  Weight: 53 kg    Examination:  General exam: Overall comfortable, not in distress, pleasant elderly female HEENT: PERRL Respiratory system: Diminished bilaterally, bilateral expiratory wheezing Cardiovascular system: Sinus tachycardia Gastrointestinal system: Abdomen is nondistended, soft and nontender. Central nervous system: Alert and oriented Extremities: No edema, no clubbing ,no cyanosis, tenderness on the right hip Skin: No rashes, no ulcers,no icterus   GU: Foley catheter   Data Reviewed: I have personally reviewed following labs and imaging studies  CBC: Recent Labs  Lab 05/07/23 2058 05/09/23 0820  WBC 14.9* 11.8*  NEUTROABS 12.4*  --   HGB 13.4 10.9*  HCT 41.9 35.4*  MCV 91.7 92.9  PLT 274 205   Basic Metabolic Panel: Recent Labs  Lab 05/07/23 2058 05/09/23 0820  NA 137 130*  K 4.7 4.9  CL 99 97*  CO2 30 24  GLUCOSE 94 136*  BUN 17 44*  CREATININE 1.10* 2.23*  CALCIUM 9.4 8.1*     No results found for this or any previous visit (from the past 240 hour(s)).   Radiology Studies: CT PELVIS WO CONTRAST  Result Date: 05/08/2023 CLINICAL DATA:  Hip trauma fall EXAM: CT PELVIS WITHOUT CONTRAST TECHNIQUE: Multidetector CT imaging of the pelvis was performed following the standard protocol without intravenous contrast. RADIATION DOSE REDUCTION: This exam was performed according to the departmental dose-optimization program which includes automated exposure control, adjustment of the mA and/or kV according to patient size and/or use of iterative reconstruction technique. COMPARISON:  05/07/2023 FINDINGS: Urinary Tract:  Urinary bladder is unremarkable Bowel: No acute bowel wall thickening. Diverticular disease of the sigmoid colon.  Vascular/Lymphatic: Aortic atherosclerosis. No aneurysm. No suspicious lymph nodes Reproductive:  Uterus unremarkable.  No adnexal mass. Other:  Negative for pelvic effusion or free air. Musculoskeletal: Acute nondisplaced right superior and inferior pubic rami fractures. Acute mildly impacted subcapital right femoral neck fracture. No femoral head dislocation. Pubic symphysis appears intact. Mild asymmetric enlargement of the right operator muscles consistent with soft tissue injury. IMPRESSION: 1. Acute nondisplaced right superior and inferior pubic rami fractures. 2. Acute mildly impacted subcapital right femoral neck fracture. 3. Aortic atherosclerosis. Aortic Atherosclerosis (ICD10-I70.0). Electronically Signed   By: Jasmine Pang M.D.   On: 05/08/2023 00:27   CT Lumbar Spine Wo Contrast  Result Date: 05/07/2023 CLINICAL DATA:  Trauma EXAM: CT LUMBAR SPINE WITHOUT CONTRAST TECHNIQUE: Multidetector CT imaging of the lumbar spine was performed without intravenous contrast administration. Multiplanar CT image reconstructions were also generated. RADIATION DOSE REDUCTION: This exam was performed according to the departmental dose-optimization program which includes automated exposure control, adjustment of the mA and/or kV according to patient size and/or use of iterative reconstruction technique. COMPARISON:  None Available. FINDINGS: Segmentation: 5 lumbar type vertebrae. Alignment: Grade 1 retrolisthesis  at L1-2 and L2-3. Vertebrae: Wedge compression fracture of T10 with 50% height loss. This is chronic. No acute fracture. Paraspinal and other soft tissues: Calcific aortic atherosclerosis. Disc levels: No bony spinal canal stenosis.  No neural impingement. IMPRESSION: 1. No acute fracture or static subluxation of the lumbar spine. 2. Chronic wedge compression fracture of T10 with 50% height loss. Aortic Atherosclerosis (ICD10-I70.0). Electronically Signed   By: Deatra Robinson M.D.   On: 05/07/2023 23:53    DG Pelvis 1-2 Views  Result Date: 05/07/2023 CLINICAL DATA:  Pain EXAM: PELVIS - 1-2 VIEW COMPARISON:  None Available. FINDINGS: The bones are osteopenic. Right hip is slightly rotated which limits evaluation. There are findings suspicious for nondisplaced fracture of the right superior pubic ramus. There is no dislocation. Soft tissues are within normal limits. IMPRESSION: Findings suspicious for nondisplaced fracture of the right superior pubic ramus. Limited evaluation of the right hip secondary to rotation. Recommend dedicated hip x-ray for further evaluation if clinically warranted. Electronically Signed   By: Darliss Cheney M.D.   On: 05/07/2023 22:30    Scheduled Meds:  buPROPion  150 mg Oral Daily   cyanocobalamin  1,000 mcg Oral Daily   donepezil  5 mg Oral QHS   feeding supplement  237 mL Oral BID BM   FLUoxetine  40 mg Oral Daily   fluticasone  1 spray Each Nare Daily   fluticasone furoate-vilanterol  1 puff Inhalation Daily   And   umeclidinium bromide  1 puff Inhalation Daily   folic acid  1 mg Oral Daily   ipratropium-albuterol  3 mL Nebulization Q6H   memantine  10 mg Oral BID   methylPREDNISolone (SOLU-MEDROL) injection  40 mg Intravenous Once   mirtazapine  15 mg Oral QHS   montelukast  10 mg Oral Daily   pantoprazole  40 mg Oral Daily   [START ON 05/10/2023] predniSONE  20 mg Oral Q breakfast   rosuvastatin  20 mg Oral Daily   sodium chloride flush  3 mL Intravenous Q12H   Continuous Infusions:   LOS: 1 day   Burnadette Pop, MD [facility] P10/31/2024, 11:10 AM

## 2023-05-09 NOTE — Discharge Instructions (Signed)

## 2023-05-09 NOTE — Op Note (Signed)
OPERATIVE REPORT  SURGEON: Samson Frederic, MD   ASSISTANT: Clint Bolder, PA-C  PREOPERATIVE DIAGNOSIS: Displaced Right femoral neck fracture.  Right LC1 pelvis fracture.  POSTOPERATIVE DIAGNOSIS: Displaced Right femoral neck fracture.  Right LC1 pelvis fracture.  PROCEDURE: Right hip hemiarthroplasty, anterior approach.  Closed treatment right pelvis fracture.   IMPLANTS: Biomet Taperloc Complete Reduced Distal stem, size 12 x 144 mm, high offset, with a 28 - 6 mm metal head ball and a 45 mm bipolar head ball.  ANESTHESIA:  General  ANTIBIOTICS: 2g ancef.  ESTIMATED BLOOD LOSS:-300 mL    DRAINS: None.  COMPLICATIONS: None   CONDITION: PACU - hemodynamically stable.   BRIEF CLINICAL NOTE: Andrea Larson is a 77 y.o. female with a displaced Right femoral neck fracture and right LC1 pelvis fracture. The patient was admitted to the hospitalist service and underwent perioperative risk stratification and medical optimization. The risks, benefits, and alternatives to hemiarthroplasty were explained, and the patient elected to proceed.  PROCEDURE IN DETAIL: The patient was taken to the operating room and general anesthesia was induced on the hospital bed.  The patient was then positioned on the Hana table.  All bony prominences were well padded.  The hip was prepped and draped in the normal sterile surgical fashion.  A time-out was called verifying side and site of surgery. Antibiotics were given within 60 minutes of beginning the procedure.   Bikini incision was made, and the direct anterior approach to the hip was performed through the Hueter interval.  Superficial dissection was carried out lateral to the ASIS. Lateral femoral circumflex vessels were treated with the Auqumantys. The anterior capsule was exposed and an inverted T capsulotomy was made. Fracture hematoma was encountered and evacuated. The patient was found to have a comminuted Right subcapital femoral neck fracture.   Inferior pubofemoral ligament was released subperiosteally to the lesser trochanter. I freshened the femoral neck cut with a saw.  I removed the femoral neck fragment.  A corkscrew was placed into the head and the head was removed.  This was passed to the back table and was measured.   Acetabular exposure was achieved.  I examined the articular cartilage which was intact.  The labrum was intact. A 45 mm trial head was placed and found to have excellent fit.   I then gained femoral exposure taking care to protect the abductors and greater trochanter.  The superior capsule was incised longitudinally, staying lateral to the posterior border of the femoral neck. External rotation, extension, and adduction were applied.  A cookie cutter was used to enter the femoral canal, and then the femoral canal finder was used to confirm location.  I then sequentially broached up to a size 12.  Calcar planer was used on the femoral neck remnant.  I placed a high offset neck and a bipolar trial head ball. The hip was reduced.  Leg lengths were checked fluoroscopically.  The hip was dislocated and trial components were removed.  I placed the real stem followed by the real bipolar construct.  A single reduction maneuver was performed and the hip was reduced.  Fluoroscopy was used to confirm component position and leg lengths.  At 90 degrees of external rotation and extension, the hip was stable to an anterior directed force.   The wound was copiously irrigated with Irrisept solution and normal saline using pulse lavage.  Marcaine solution was injected into the periarticular soft tissue.  The wound was closed in layers using #1 Stratafix for  the fascia, 2-0 Vicryl for the subcutaneous fat, 2-0 Monocryl for the deep dermal layer, and staples + Dermabond for the skin.  Once the glue was fully dried, an Aquacell Ag dressing was applied.  The patient was then awakened from anesthesia and transported to the recovery room in stable  condition.  Sponge, needle, and instrument counts were correct at the end of the case x2.  The patient tolerated the procedure well and there were no known complications.  The aquamantis was utilized for this case to help facilitate better hemostasis as patient was felt to be at increased risk of bleeding because of preop anemia and complex case requiring increased OR time and/or exposure.  A oscillating saw tip was utilized for this case to prevent damage to the soft tissue structures such as muscles, ligaments and tendons, and to ensure accurate bone cuts. This patient was at increased risk for above structures due to  minimally invasive approach.  Please note that a surgical assistant was a medical necessity for this procedure to perform it in a safe and expeditious manner. Assistant was necessary to provide appropriate retraction of vital neurovascular structures, to prevent femoral fracture, and to allow for anatomic placement of the prosthesis.

## 2023-05-09 NOTE — Progress Notes (Signed)
PHARMACY NOTE:  ANTIMICROBIAL RENAL DOSAGE ADJUSTMENT  Current antimicrobial regimen includes a mismatch between antimicrobial dosage and estimated renal function.  As per policy approved by the Pharmacy & Therapeutics and Medical Executive Committees, the antimicrobial dosage will be adjusted accordingly.  Current antimicrobial dosage: ancef q6h x2 doses post op  Indication: Surgical Prophylaxis   Renal Function:  Estimated Creatinine Clearance: 16.2 mL/min (A) (by C-G formula based on SCr of 2.23 mg/dL (H)). []      On intermittent HD, scheduled: []      On CRRT    Antimicrobial dosage has been changed to:  ancef 1 gm IV x1 (this will last for 12 hrs)    Thank you for allowing pharmacy to be a part of this patient's care.  Lucia Gaskins, [facility] 05/09/2023 7:09 PM

## 2023-05-09 NOTE — Progress Notes (Signed)
PT Cancellation Note  Patient Details Name: Andrea Larson MRN: [id number] DOB: Dec 31, 1945   Cancelled Treatment:    Reason Eval/Treat Not Completed: (P) Other (comment) Plans for transport to [facility] for surgery. PT to follow up post surgery as appropriate.  Tranice Laduke B. Beverely Risen PT, DPT Acute Rehabilitation Services Please use secure chat or  Call Office [phone]    [facility]on [facility] 05/09/2023, 8:34 AM

## 2023-05-09 NOTE — Progress Notes (Addendum)
[facility] transport set up for patient to be picked up around 1115 to come to [place] at 1200.  Patient will be staying at [facility] after surgery.  Patient has a bed request in.  Evern Bio BSN, RN Nurse, mental health - Perioperative Services [facility] [phone]

## 2023-05-09 NOTE — Progress Notes (Signed)
OT Cancellation Note  Patient Details Name: LEONNE MONEYPENNY MRN: [id number] DOB: [birth year]   Cancelled Treatment:    Reason Eval/Treat Not Completed: Other (comment)- plans for transport to [facility] for surgery.  OT will assess as appropriate.   Barry Brunner, OT Acute Rehabilitation Services Office [phone]   Chancy Milroy 05/09/2023, 8:24 AM

## 2023-05-09 NOTE — Anesthesia Postprocedure Evaluation (Signed)
Anesthesia Post Note  Patient: Andrea Larson  Procedure(s) Performed: HEMI HIP ARTHROPLASTY ANTERIOR APPROACH (Right: Hip)     Patient location during evaluation: PACU Anesthesia Type: General Level of consciousness: awake and alert Pain management: pain level controlled Vital Signs Assessment: post-procedure vital signs reviewed and stable Respiratory status: spontaneous breathing, nonlabored ventilation, respiratory function stable and patient connected to nasal cannula oxygen Cardiovascular status: blood pressure returned to baseline and stable Postop Assessment: no apparent nausea or vomiting Anesthetic complications: no   No notable events documented.  Last Vitals:  Vitals:   05/09/23 1815 05/09/23 1830  BP: 139/76   Pulse: 95 94  Resp: 10 10  Temp:    SpO2: 99% 99%    Last Pain:  Vitals:   05/09/23 1830  TempSrc:   PainSc: Asleep                 Trevor Iha

## 2023-05-10 ENCOUNTER — Encounter (HOSPITAL_COMMUNITY): Payer: Self-pay | Admitting: Orthopedic Surgery

## 2023-05-10 DIAGNOSIS — S72001A Fracture of unspecified part of neck of right femur, initial encounter for closed fracture: Secondary | ICD-10-CM | POA: Diagnosis not present

## 2023-05-10 LAB — CBC
HCT: 28.6 % — ABNORMAL LOW (ref 36.0–46.0)
Hemoglobin: 9.3 g/dL — ABNORMAL LOW (ref 12.0–15.0)
MCH: 30.4 pg (ref 26.0–34.0)
MCHC: 32.5 g/dL (ref 30.0–36.0)
MCV: 93.5 fL (ref 80.0–100.0)
Platelets: 157 10*3/uL (ref 150–400)
RBC: 3.06 MIL/uL — ABNORMAL LOW (ref 3.87–5.11)
RDW: 12.8 % (ref 11.5–15.5)
WBC: 9.9 10*3/uL (ref 4.0–10.5)
nRBC: 0 % (ref 0.0–0.2)

## 2023-05-10 LAB — BASIC METABOLIC PANEL
Anion gap: 8 (ref 5–15)
BUN: 37 mg/dL — ABNORMAL HIGH (ref 8–23)
CO2: 26 mmol/L (ref 22–32)
Calcium: 7.9 mg/dL — ABNORMAL LOW (ref 8.9–10.3)
Chloride: 95 mmol/L — ABNORMAL LOW (ref 98–111)
Creatinine, Ser: 1.53 mg/dL — ABNORMAL HIGH (ref 0.44–1.00)
GFR, Estimated: 34 mL/min — ABNORMAL LOW (ref >60)
Glucose, Bld: 152 mg/dL — ABNORMAL HIGH (ref 70–99)
Potassium: 4.8 mmol/L (ref 3.5–5.1)
Sodium: 129 mmol/L — ABNORMAL LOW (ref 135–145)

## 2023-05-10 MED ORDER — HYDROCODONE-ACETAMINOPHEN 5-325 MG PO TABS: 1.0000 | ORAL_TABLET | ORAL | 0 refills | Status: AC | PRN

## 2023-05-10 MED ORDER — AZITHROMYCIN 250 MG PO TABS
500.0000 mg | ORAL_TABLET | Freq: Every day | ORAL | Status: AC
  Administered 2023-05-10 – 2023-05-13 (×4): 500 mg via ORAL
  Filled 2023-05-10 (×4): qty 2

## 2023-05-10 MED ORDER — ASPIRIN 81 MG PO CHEW: 81.0000 mg | CHEWABLE_TABLET | Freq: Two times a day (BID) | ORAL | 0 refills | Status: AC

## 2023-05-10 NOTE — Plan of Care (Signed)
  Problem: Activity: Goal: Risk for activity intolerance will decrease Outcome: Progressing   Problem: Pain Management: Goal: General experience of comfort will improve Outcome: Progressing   Problem: Safety: Goal: Ability to remain free from injury will improve Outcome: Progressing

## 2023-05-10 NOTE — [place] FL2 (Signed)
[place] MEDICAID FL2 LEVEL OF CARE FORM     IDENTIFICATION  Patient Name: Andrea Larson Birthdate: 10-10-1945 Sex: female Admission Date (Current Location): 05/07/2023  [facility] and [place] Number:  Producer, television/film/video and Address:  [facility],  [address], [place] [address]      Provider Number: [id number]  Attending Physician Name and Address:  Dorcas Carrow, MD  Relative Name and Phone Number:  son, Chyrel Masson @ [phone]    Current Level of Care: Hospital Recommended Level of Care: Skilled Nursing Facility Prior Approval Number:    Date Approved/Denied:   PASRR Number: [id number]  Discharge Plan: SNF    Current Diagnoses: Patient Active Problem List   Diagnosis Date Noted   Closed fracture of multiple pubic rami, right, initial encounter (HCC) 05/08/2023   Closed displaced fracture of right femoral neck (HCC) 05/08/2023   History of COPD 05/08/2023   GAD (generalized anxiety disorder) 05/08/2023   Fall at nursing home. 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   Dysphagia    Protein-calorie malnutrition, severe 04/10/2021   Aortic atherosclerosis (HCC) 04/04/2021   Depression with anxiety    History of colon polyps 10/17/2020   Vitamin D deficiency 10/17/2020   Dementia with behavioral disturbance 08/09/2020   Gait abnormality 08/09/2020   Chronic hypoxemic respiratory failure (HCC) 04/10/2020   Peripheral edema 05/21/2018   Gait disorder 05/21/2018   CKD stage 3a, GFR 45-59 ml/min (HCC) 04/28/2018   Malnutrition of moderate degree 04/13/2018   Essential hypertension 04/11/2018   Gallstones 04/11/2018   Coronary artery calcification 08/23/2016   Aortic stenosis 08/23/2016   Insomnia 08/18/2015   GERD (gastroesophageal reflux disease) 08/18/2015   Back pain 10/28/2012   Asthma 09/21/2011   Allergic  rhinitis 09/21/2011   Colon polyps 09/21/2011   Encounter for well adult exam with abnormal findings 09/15/2011   Spondylosis 10/21/2008   Overweight 02/10/2008   ANXIETY DEPRESSION 02/10/2008   COPD (chronic obstructive pulmonary disease) (HCC) 02/10/2008   EMPHYSEMA NEC 01/30/2007   Hyperlipidemia 12/31/2006    Orientation RESPIRATION BLADDER Height & Weight     Self, Situation, Place, Time  O2 Continent, External catheter (currently with purewick) Weight: 116 lb 12.8 oz (53 kg) Height:  5\' 2"  (157.5 cm)  BEHAVIORAL SYMPTOMS/MOOD NEUROLOGICAL BOWEL NUTRITION STATUS      Continent Diet (regular)  AMBULATORY STATUS COMMUNICATION OF NEEDS Skin   Limited Assist   Other (Comment) (surgical incision)                       Personal Care Assistance Level of Assistance  Bathing, Feeding, Dressing Bathing Assistance: Limited assistance Feeding assistance: Independent Dressing Assistance: Limited assistance     Functional Limitations Info  Sight, Hearing, Speech Sight Info: Adequate Hearing Info: Adequate Speech Info: Adequate    SPECIAL CARE FACTORS FREQUENCY  PT (By licensed PT), OT (By licensed OT)     PT Frequency: 5x/wk OT Frequency: 5x/wk            Contractures Contractures Info: Not present    Additional Factors Info  Code Status, Allergies, Psychotropic Code Status Info: DNR Allergies Info: lipitor Psychotropic Info: see MAR         Current Medications (05/10/2023):  This is the current hospital active medication list Current Facility-Administered Medications  Medication Dose Route Frequency Provider Last Rate Last Admin  acetaminophen (TYLENOL) tablet 650 mg  650 mg Oral Q6H PRN Clois Dupes, PA-C   650 mg at 05/09/23 1954   Or   acetaminophen (TYLENOL) suppository 650 mg  650 mg Rectal Q6H PRN Clois Dupes, PA-C       aspirin EC tablet 325 mg  325 mg Oral Daily Clois Dupes, [place]   325 mg at 05/10/23 0981   azithromycin (ZITHROMAX) tablet 500  mg  500 mg Oral QHS Len Childs T, RPH       bisacodyl (DULCOLAX) EC tablet 5 mg  5 mg Oral Daily PRN Hill, Denny Peon S, PA-C       bisacodyl (DULCOLAX) suppository 10 mg  10 mg Rectal Daily PRN Clois Dupes, PA-C       buPROPion (WELLBUTRIN XL) 24 hr tablet 150 mg  150 mg Oral Daily Clint Bolder S, PA-C   150 mg at 05/10/23 1914   cefTRIAXone (ROCEPHIN) 2 g in sodium chloride 0.9 % 100 mL IVPB  2 g Intravenous Q24H HillAlain Honey, [place]   Stopped at 05/09/23 2019   Chlorhexidine Gluconate Cloth 2 % PADS 6 each  6 each Topical Daily Dorcas Carrow, MD   6 each at 05/10/23 0955   cyanocobalamin (VITAMIN B12) tablet 1,000 mcg  1,000 mcg Oral Daily Clois Dupes, PA-C   1,000 mcg at 05/10/23 7829   docusate sodium (COLACE) capsule 100 mg  100 mg Oral BID Clois Dupes, PA-C   100 mg at 05/10/23 0825   donepezil (ARICEPT) tablet 5 mg  5 mg Oral QHS HillAlain Honey, PA-C   5 mg at 05/09/23 2252   feeding supplement (ENSURE ENLIVE / ENSURE PLUS) liquid 237 mL  237 mL Oral BID BM HillAlain Honey, PA-C   237 mL at 05/10/23 0827   FLUoxetine (PROZAC) capsule 40 mg  40 mg Oral Daily Clois Dupes, PA-C   40 mg at 05/10/23 0825   fluticasone (FLONASE) 50 MCG/ACT nasal spray 1 spray  1 spray Each Nare Daily Clois Dupes, PA-C   1 spray at 05/10/23 1003   fluticasone furoate-vilanterol (BREO ELLIPTA) 200-25 MCG/ACT 1 puff  1 puff Inhalation Daily HillAlain Honey, PA-C   1 puff at 05/10/23 5621   And   umeclidinium bromide (INCRUSE ELLIPTA) 62.5 MCG/ACT 1 puff  1 puff Inhalation Daily Clois Dupes, PA-C   1 puff at 05/10/23 3086   folic acid (FOLVITE) tablet 1 mg  1 mg Oral Daily Clois Dupes, PA-C   1 mg at 05/10/23 5784   HYDROcodone-acetaminophen (NORCO) 7.5-325 MG per tablet 1-2 tablet  1-2 tablet Oral Q4H PRN Clois Dupes, PA-C   1 tablet at 05/09/23 2344   HYDROcodone-acetaminophen (NORCO/VICODIN) 5-325 MG per tablet 1-2 tablet  1-2 tablet Oral Q4H PRN Clois Dupes, PA-C   2 tablet at 05/10/23 6962    ipratropium-albuterol (DUONEB) 0.5-2.5 (3) MG/3ML nebulizer solution 3 mL  3 mL Nebulization BID Dorcas Carrow, MD   3 mL at 05/10/23 0806   melatonin tablet 3 mg  3 mg Oral QHS PRN Clois Dupes, PA-C   3 mg at 05/09/23 1954   memantine (NAMENDA) tablet 10 mg  10 mg Oral BID Clois Dupes, PA-C   10 mg at 05/10/23 0825   menthol-cetylpyridinium (CEPACOL) lozenge 3 mg  1 lozenge Oral PRN Clois Dupes, PA-C       Or   phenol (CHLORASEPTIC) mouth spray 1 spray  1 spray Mouth/Throat PRN Clois Dupes, PA-C       methocarbamol (ROBAXIN) tablet 500 mg  500 mg Oral Q6H PRN Clint Bolder S, PA-C       Or   methocarbamol (ROBAXIN) injection 500 mg  500 mg Intravenous Q6H PRN Hill, Avery S, PA-C       metoCLOPramide (REGLAN) tablet 5-10 mg  5-10 mg Oral Q8H PRN Hill, Avery S, PA-C       Or   metoCLOPramide (REGLAN) injection 5-10 mg  5-10 mg Intravenous Q8H PRN Hill, Avery S, PA-C       mirtazapine (REMERON) tablet 15 mg  15 mg Oral QHS Clois Dupes, PA-C   15 mg at 05/09/23 2251   montelukast (SINGULAIR) tablet 10 mg  10 mg Oral Daily Clois Dupes, PA-C   10 mg at 05/10/23 8469   morphine (PF) 2 MG/ML injection 0.5-1 mg  0.5-1 mg Intravenous Q2H PRN Hill, Alain Honey, PA-C       ondansetron (ZOFRAN) tablet 4 mg  4 mg Oral Q6H PRN Hill, Denny Peon S, PA-C       Or   ondansetron (ZOFRAN) injection 4 mg  4 mg Intravenous Q6H PRN Clois Dupes, PA-C   4 mg at 05/09/23 0602   pantoprazole (PROTONIX) EC tablet 40 mg  40 mg Oral Daily Clois Dupes, PA-C   40 mg at 05/10/23 0825   polyethylene glycol (MIRALAX / GLYCOLAX) packet 17 g  17 g Oral Daily PRN Clint Bolder S, PA-C       rosuvastatin (CRESTOR) tablet 20 mg  20 mg Oral Daily Clint Bolder S, PA-C   20 mg at 05/10/23 6295   sodium chloride flush (NS) 0.9 % injection 3 mL  3 mL Intravenous Q12H Hill, Avery S, PA-C       sodium chloride flush (NS) 0.9 % injection 3 mL  3 mL Intravenous PRN Clois Dupes, PA-C         Discharge Medications: Please see  discharge summary for a list of discharge medications.  Relevant Imaging Results:  Relevant Lab Results:   Additional Information SSN:[id number]  Amyrie Illingworth, LCSW

## 2023-05-10 NOTE — Progress Notes (Signed)
    Subjective:  Patient reports pain as mild.  Denies N/V/CP/SOB/Abd pain. She denies any tingling or numbness in LE bilaterally. She reports very mild to no pain this morning.   Objective:   VITALS:   Vitals:   05/10/23 0153 05/10/23 0535 05/10/23 0809 05/10/23 1014  BP: 111/60 (!) 122/57  130/64  Pulse: 84 80  (!) 102  Resp: 17 18  20   Temp: 98 F (36.7 C) 97.6 F (36.4 C)  97.7 F (36.5 C)  TempSrc:  Oral    SpO2: 94% 97% 96% 97%  Weight:      Height:       A&O x3.  Patient sitting up in bed. NAD.  Neurologically intact ABD soft Neurovascular intact Sensation intact distally Intact pulses distally Dorsiflexion/Plantar flexion intact Incision: dressing C/D/I No cellulitis present Compartment soft Bruising on anterior thigh.   Lab Results  Component Value Date   WBC 9.9 05/10/2023   HGB 9.3 (L) 05/10/2023   HCT 28.6 (L) 05/10/2023   MCV 93.5 05/10/2023   PLT 157 05/10/2023   BMET    Component Value Date/Time   NA 129 (L) 05/10/2023 0332   K 4.8 05/10/2023 0332   CL 95 (L) 05/10/2023 0332   CO2 26 05/10/2023 0332   GLUCOSE 152 (H) 05/10/2023 0332   BUN 37 (H) 05/10/2023 0332   CREATININE 1.53 (H) 05/10/2023 0332   CREATININE 1.30 (H) 05/01/2023 1532   CALCIUM 7.9 (L) 05/10/2023 0332   EGFR 41 (L) 02/11/2023 1626   GFRNONAA 34 (L) 05/10/2023 0332   GFRNONAA 29 (L) 04/05/2020 1628     Assessment/Plan: 1 Day Post-Op   Principal Problem:   Pelvic fracture (HCC) Active Problems:   Hyperlipidemia   Essential hypertension   CKD stage 3a, GFR 45-59 ml/min (HCC)   B12 deficiency   Closed fracture of multiple pubic rami, right, initial encounter (HCC)   Closed displaced fracture of right femoral neck (HCC)   History of COPD   GAD (generalized anxiety disorder)   Fall at nursing home.  ABLA. Hemoglobin 9.3. Continue to monitor.   WBAT with walker to tolerance.  DVT ppx: Aspirin, SCDs, TEDS PO pain control PT/OT: PT has not seen yet. PT to come  by today.  Dispo:  - Patient under care of the medical team, disposition per their recommendation. Aspirin 81mg  BID for DVT ppx x6 weeks. Pain medications to be printed in chart.    Clois Dupes, PA-C 05/10/2023, 10:21 AM   [facility]  [place] [address], [address], [place], [place] [address] Phone: [phone] [email or web address] Facebook  [employer]

## 2023-05-10 NOTE — Evaluation (Addendum)
Physical Therapy Evaluation Patient Details Name: Andrea Larson MRN: [id number] DOB: 1945/10/11 Today's Date: 05/10/2023  History of Present Illness  Patient is a 77 year old female with history of hypertension, GAD, dementia, CKD stage IIIb, COPD, vitamin B12 deficiency  who presented from ALF[facility] after a fall resulting in pain on bilateral hip.  CT of the pelvis showed acute nondisplaced right superior and inferior rami fracture, acute mildly impacted subcapital right femoral neck fracture.  Patient admitted and underwent right hip hemi arthroplasty.  Clinical Impression  Pt admitted with above diagnosis.  Pt currently with functional limitations due to the deficits listed below (see PT Problem List). Pt will benefit from acute skilled PT to increase their independence and safety with mobility to allow discharge.     Patient received in recliner.] patient is not able to give a clear  picture of her mobility and amount of assistance she receives at the ALF. Patient states that she ambulates with a rollator. She also does not know if she is on supplemental O2 PTA. Patient does state that she resides at [facility]( ILF)  Patient ambulated x 5' with 2 person mod assistance, reporting  significant pain, more so in the groin  area.  Assisted  patient to [facility] and back to recliner. SPo2 on 2 LPM 90%. HR 112.  No family present. Patient will benefit from continued inpatient follow up therapy, <3 hours/day       If plan is discharge home, recommend the following: Two people to help with walking and/or transfers;A lot of help with bathing/dressing/bathroom;Direct supervision/assist for medications management;Assist for transportation;Help with stairs or ramp for entrance   Can travel by private vehicle   No    Equipment Recommendations None recommended by PT  Recommendations for Other Services       Functional Status Assessment Patient has had a recent decline in their functional status  and demonstrates the ability to make significant improvements in function in a reasonable and predictable amount of time.     Precautions / Restrictions Precautions Precautions: Fall Restrictions RLE Weight Bearing: Weight bearing as tolerated      Mobility  Bed Mobility               General bed mobility comments: in recliner    Transfers Overall transfer level: Needs assistance Equipment used: Rolling walker (2 wheels) Transfers: Sit to/from Stand Sit to Stand: Mod assist, From elevated surface           General transfer comment: assistance to rise from bed and [facility]    Ambulation/Gait Ambulation/Gait assistance: Mod assist, +2 safety/equipment, +2 physical assistance Gait Distance (Feet): 5 Feet Assistive device: Rolling walker (2 wheels) Gait Pattern/deviations: Step-to pattern, Antalgic       General Gait Details: cues for sequence, position inside RW  Stairs            Wheelchair Mobility     Tilt Bed    Modified Rankin (Stroke Patients Only)       Balance Overall balance assessment: History of Falls, Needs assistance Sitting-balance support: Feet supported, Bilateral upper extremity supported Sitting balance-Leahy Scale: Fair     Standing balance support: During functional activity, Bilateral upper extremity supported Standing balance-Leahy Scale: Poor                               Pertinent Vitals/Pain Pain Assessment Pain Assessment: Faces Faces Pain Scale: Hurts even more Pain Location: right  hip medially Pain Descriptors / Indicators: Grimacing, Moaning, Discomfort Pain Intervention(s): Monitored during session, Premedicated before session    Home Living Family/patient expects to be discharged to:: Assisted living                 Home Equipment: Rollator (4 wheels);Wheelchair - manual      Prior Function Prior Level of Function : Needs assist             Mobility Comments: poor historian by pt,   pt reports use of rollator, does not know how she amb, does not know how she gets meals"Maybe they bring them.",does not know if  she has assistance with ADL's       Extremity/Trunk Assessment   Upper Extremity Assessment Upper Extremity Assessment: Defer to OT evaluation    Lower Extremity Assessment Lower Extremity Assessment: RLE deficits/detail RLE Deficits / Details: able to flex knee and hip,    Cervical / Trunk Assessment Cervical / Trunk Assessment: Kyphotic  Communication   Communication Communication: No apparent difficulties  Cognition Arousal: Alert Behavior During Therapy: Anxious Overall Cognitive Status: No family/caregiver present to determine baseline cognitive functioning Area of Impairment: Orientation, Awareness, Memory, Safety/judgement                 Orientation Level: Situation, Time   Memory: Decreased short-term memory   Safety/Judgement: Decreased awareness of deficits, Decreased awareness of safety Awareness: Anticipatory            General Comments      Exercises     Assessment/Plan    PT Assessment Patient needs continued PT services  PT Problem List Decreased strength;Decreased safety awareness;Decreased range of motion;Decreased knowledge of precautions;Decreased activity tolerance;Decreased cognition;Pain;Decreased balance;Decreased knowledge of use of DME       PT Treatment Interventions DME instruction;Therapeutic exercise;Gait training;Functional mobility training;Cognitive remediation;Therapeutic activities;Patient/family education    PT Goals (Current goals can be found in the Care Plan section)  Acute Rehab PT Goals Patient Stated Goal: to walk PT Goal Formulation: With patient Time For Goal Achievement: 05/24/23 Potential to Achieve Goals: Fair    Frequency Min 1X/week     Co-evaluation               AM-PAC PT "6 Clicks" Mobility  Outcome Measure Help needed turning from your back to your side while  in a flat bed without using bedrails?: A Lot Help needed moving from lying on your back to sitting on the side of a flat bed without using bedrails?: A Lot Help needed moving to and from a bed to a chair (including a wheelchair)?: Total Help needed standing up from a chair using your arms (e.g., wheelchair or bedside chair)?: A Lot Help needed to walk in hospital room?: Total Help needed climbing 3-5 steps with a railing? : Total 6 Click Score: 9    End of Session Equipment Utilized During Treatment: Gait belt Activity Tolerance: Patient limited by pain Patient left: with chair alarm set;with call bell/phone within reach;in chair Nurse Communication: Mobility status PT Visit Diagnosis: Unsteadiness on feet (R26.81);Muscle weakness (generalized) (M62.81);Difficulty in walking, not elsewhere classified (R26.2);History of falling (Z91.81)    Time: 8119-1478 PT Time Calculation (min) (ACUTE ONLY): 33 min   Charges:   PT Evaluation $PT Eval Low Complexity: 1 Low PT Treatments $Gait Training: 8-22 mins PT General Charges $$ ACUTE PT VISIT: 1 Visit         Blanchard Kelch PT Acute Rehabilitation Services Office [phone] Weekend pager-[phone]  Rada Hay 05/10/2023, 1:31 PM

## 2023-05-10 NOTE — Progress Notes (Signed)
PROGRESS NOTE  HENDRIX CONSOLE  ZOX:[id number] DOB: May 26, 1946 DOA: 05/07/2023 PCP: Caesar Bookman, NP   Brief Narrative: Patient is a 77 year old female with history of hypertension, GAD, dementia, CKD stage IIIb, COPD, vitamin B12 deficiency  who presented from ALF after a fall resulting in pain on bilateral hip.  No history of head injury.  On presenation, patient was hemodynamically stable with mild sinus tachycardia.  Lab work showed WC count of 14.9.  CT of the pelvis showed acute nondisplaced right superior and inferior rami fracture, acute mildly impacted subcapital right femoral neck fracture.  Patient admitted and underwent right hip hemi arthroplasty.  Postop day 1.    Assessment & Plan:  Principal Problem:   Pelvic fracture (HCC) Active Problems:   Closed fracture of multiple pubic rami, right, initial encounter (HCC)   Closed displaced fracture of right femoral neck (HCC)   Hyperlipidemia   Essential hypertension   CKD stage 3a, GFR 45-59 ml/min (HCC)   B12 deficiency   History of COPD   GAD (generalized anxiety disorder)   Fall at nursing home.   Closed traumatic right femoral neck fracture/right superior and inferior pubic rami fracture:  Status post right Hemi hip arthroplasty.  Day 1. Weightbearing as tolerated. DVT prophylaxis aspirin 325 mg daily. Adequate pain management. Conservative management of pubic rami fractures. Probably will need a skilled level of care.  Work with PT OT today.   Hypertension: Blood pressures are adequate.  Antihypertensives on hold now.   Leukocytosis: Most likely reactive.  No infectious signs.  Normalized.   AKI on CKD stage IIIa: Baseline creatinine around 1.2.  Kidney function bumped up to the range of 2 and she was given IV fluids.  Trending down.  Discontinue IV fluids.  Encourage oral intake.  Foley has been removed.     Hyperlipidemia: On Crestor   History of COPD/chronic hypoxic respiratory failure: On 2 L of oxygen  at home at night only.    Takes Singulair, Trelegy .  Patient was empirically started on antibiotics.  Will can monitor today.   History of dementia: On donepezil, memantine.  Continue delirium precaution, frequent reorientation.  Alert and oriented this morning.  Fall precautions.  Delirium precautions.   History of GAD: On bupropion, Prozac, Remeron   Vitamin B12 deficiency: continue supplementation  Anemia: Of acute blood loss.  As anticipated from long bone fracture, pelvic fracture and hemodilution.  Will monitor.  Currently no indication for transfusion.       DVT prophylaxis:SCDs Start: 05/09/23 1904 SCDs Start: 05/08/23 0559     Code Status: Limited: Do not attempt resuscitation (DNR) -DNR-LIMITED -Do Not Intubate/DNI   Family Communication:: None today.  Patient status: Inpatient  Patient is from : ALF  Anticipated discharge to: SNF  Estimated DC date: 2 to 3 days   Consultants: Orthopedics  Procedures: Right hip  Antimicrobials:  Anti-infectives (From admission, onward)    Start     Dose/Rate Route Frequency Ordered Stop   05/10/23 2200  azithromycin (ZITHROMAX) tablet 500 mg        500 mg Oral Daily at bedtime 05/10/23 0748 05/14/23 2159   05/10/23 0400  ceFAZolin (ANCEF) IVPB 1 g/50 mL premix        1 g 100 mL/hr over 30 Minutes Intravenous  Once 05/09/23 1912 05/10/23 0405   05/09/23 2000  cefTRIAXone (ROCEPHIN) 2 g in sodium chloride 0.9 % 100 mL IVPB        2 g 200 mL/hr  over 30 Minutes Intravenous Every 24 hours 05/09/23 1318     05/09/23 2000  azithromycin (ZITHROMAX) 500 mg in dextrose 5 % 250 mL IVPB  Status:  Discontinued        500 mg 250 mL/hr over 60 Minutes Intravenous Every 24 hours 05/09/23 1318 05/10/23 0748   05/09/23 2000  ceFAZolin (ANCEF) IVPB 2g/100 mL premix  Status:  Discontinued        2 g 200 mL/hr over 30 Minutes Intravenous Every 6 hours 05/09/23 1903 05/09/23 1912   05/09/23 1215  ceFAZolin (ANCEF) IVPB 2g/100 mL premix         2 g 200 mL/hr over 30 Minutes Intravenous On call to O.R. 05/09/23 1205 05/09/23 1630       Subjective:  Patient was seen and examined.  She was helped to the chair by nursing staff.  Pleasant.  Patient does not remember having surgery.  She is not hurting at rest.  Forgetful.  No other overnight events.  Objective: Vitals:   05/10/23 0153 05/10/23 0535 05/10/23 0809 05/10/23 1014  BP: 111/60 (!) 122/57  130/64  Pulse: 84 80  (!) 102  Resp: 17 18  20   Temp: 98 F (36.7 C) 97.6 F (36.4 C)  97.7 F (36.5 C)  TempSrc:  Oral    SpO2: 94% 97% 96% 97%  Weight:      Height:        Intake/Output Summary (Last 24 hours) at 05/10/2023 1113 Last data filed at 05/10/2023 0700 Gross per 24 hour  Intake 2100.67 ml  Output 1550 ml  Net 550.67 ml   Filed Weights   05/07/23 1847  Weight: 53 kg    Examination:  General: Looks fairly comfortable sitting in couch. Cardiovascular: S1-S2 normal.  Regular rate rhythm. Respiratory: Bilateral clear.  No added sounds. Gastrointestinal: Soft and nontender. Ext: No swelling or edema. Right anterior thigh incision clean and dry.  Dressing intact.  Not removed. Neuro: Alert awake.  Oriented to herself and mostly to the situation.  Forgetful. Musculoskeletal: No deformities.     Data Reviewed: I have personally reviewed following labs and imaging studies  CBC: Recent Labs  Lab 05/07/23 2058 05/09/23 0820 05/10/23 0332  WBC 14.9* 11.8* 9.9  NEUTROABS 12.4*  --   --   HGB 13.4 10.9* 9.3*  HCT 41.9 35.4* 28.6*  MCV 91.7 92.9 93.5  PLT 274 205 157   Basic Metabolic Panel: Recent Labs  Lab 05/07/23 2058 05/09/23 0820 05/10/23 0332  NA 137 130* 129*  K 4.7 4.9 4.8  CL 99 97* 95*  CO2 30 24 26   GLUCOSE 94 136* 152*  BUN 17 44* 37*  CREATININE 1.10* 2.23* 1.53*  CALCIUM 9.4 8.1* 7.9*     No results found for this or any previous visit (from the past 240 hour(s)).   Radiology Studies: DG Pelvis Portable  Result  Date: 05/09/2023 CLINICAL DATA:  Right hip replacement.  [id number].  Postoperative. EXAM: PORTABLE PELVIS 1-2 VIEWS COMPARISON:  CT of the pelvis 05/07/2023 FINDINGS: Skin staples overlie the right hip region. The right femoral head and neck have been resected. A total right hip replacement has been inserted. No acute hardware complication is seen. Hardware alignment appears physiologic on this single view. Osteopenia. Recent fractures of the right superior and inferior pubic rami are again shown with mild displacement. No new fracture has become apparent. The proximal left femur is unremarkable. There is spurring of the SI joints and pubic symphysis. Patchy  calcific plaques both superficial femoral arteries. Patchy soft tissue gas extends over the operative bed on the right. IMPRESSION: 1. Status post total right hip replacement without evidence of acute hardware complication. 2. Recent fractures of the right superior and inferior pubic rami are again shown with mild displacement. 3. Osteopenia and degenerative change. 4. Atherosclerosis. Electronically Signed   By: Almira Bar M.D.   On: 05/09/2023 22:36   DG HIP UNILAT WITH PELVIS 1V RIGHT  Result Date: 05/09/2023 CLINICAL DATA:  Right hip arthroplasty. EXAM: DG HIP (WITH OR WITHOUT PELVIS) 1V RIGHT COMPARISON:  Pelvic CT dated 05/07/2023. FINDINGS: Six intraoperative fluoroscopic spot images of the right hip provided. The cumulative air Karma is 1.01 mGy. There has been interval placement of a right hip arthroplasty. IMPRESSION: Intraoperative fluoroscopic spot images of the right hip. Electronically Signed   By: Elgie Collard M.D.   On: 05/09/2023 21:41   DG C-Arm 1-60 Min-No Report  Result Date: 05/09/2023 Fluoroscopy was utilized by the requesting physician.  No radiographic interpretation.   DG CHEST PORT 1 VIEW  Result Date: 05/09/2023 CLINICAL DATA:  10028 Wheezing 42595 EXAM: PORTABLE CHEST 1 VIEW COMPARISON:  CXR 01/08/23 FINDINGS: No  pleural effusion. No pneumothorax. Likely unchanged cardiac and mediastinal contours when accounting for differences in lung volumes. Compared to prior exam there are increased bilateral perihilar and interstitial opacities, which can be seen in the setting of atypical infection or bronchitis. Radiographically apparent displaced rib fractures. Visualized upper abdomen unremarkable. IMPRESSION: Increased bilateral perihilar and interstitial opacities, which can be seen in the setting of atypical infection or bronchitis. There is clinically indicated, this could be further assessed with a chest CT. Electronically Signed   By: Lorenza Cambridge M.D.   On: 05/09/2023 13:14    Scheduled Meds:  aspirin EC  325 mg Oral Daily   azithromycin  500 mg Oral QHS   buPROPion  150 mg Oral Daily   Chlorhexidine Gluconate Cloth  6 each Topical Daily   cyanocobalamin  1,000 mcg Oral Daily   docusate sodium  100 mg Oral BID   donepezil  5 mg Oral QHS   feeding supplement  237 mL Oral BID BM   FLUoxetine  40 mg Oral Daily   fluticasone  1 spray Each Nare Daily   fluticasone furoate-vilanterol  1 puff Inhalation Daily   And   umeclidinium bromide  1 puff Inhalation Daily   folic acid  1 mg Oral Daily   ipratropium-albuterol  3 mL Nebulization BID   memantine  10 mg Oral BID   mirtazapine  15 mg Oral QHS   montelukast  10 mg Oral Daily   pantoprazole  40 mg Oral Daily   rosuvastatin  20 mg Oral Daily   sodium chloride flush  3 mL Intravenous Q12H   Continuous Infusions:  cefTRIAXone (ROCEPHIN)  IV Stopped (05/09/23 2019)     LOS: 2 days   Andrea Carrow, MD [facility] P11/07/2022, 11:13 AM

## 2023-05-10 NOTE — Plan of Care (Signed)

## 2023-05-10 NOTE — TOC Initial Note (Signed)
Transition of Care [facility]) - Initial/Assessment Note    Patient Details  Name: Andrea Larson MRN: [id number] Date of Birth: 30-Dec-1945  Transition of [facility]) CM/SW Contact:    Amada Jupiter, LCSW Phone Number: 05/10/2023, 2:56 PM  Clinical Narrative:                 Met with pt and two sons this afternoon to discuss dc planning needs.  All confirm that pt was admitted here after a fall at [facility]F (pt there since March 2024).  Pt is hopeful to return but aware that, per PT eval, SNF rehab is recommended prior to return to ALF.    Pt and family are agreeable with this plan.  Reviewed SNF bed search and insurance auth process.  Bed search begun this afternoon and will secure auth once facility chosen.    Expected Discharge Plan: Skilled Nursing Facility Barriers to Discharge: Insurance Authorization, SNF Pending bed offer   Patient Goals and CMS Choice Patient states their goals for this hospitalization and ongoing recovery are:: return to ALF after SNF          Expected Discharge Plan and Services In-house Referral: Clinical Social Work   Post Acute Care Choice: Skilled Nursing Facility Living arrangements for the past 2 months: Assisted Living Facility ([facility] since March 2024)                 DME Arranged: N/A DME Agency: NA                  Prior Living Arrangements/Services Living arrangements for the past 2 months: Assisted Living Facility ([facility] since March 2024) Lives with:: Facility Resident Patient language and need for interpreter reviewed:: Yes Do you feel safe going back to the place where you live?: Yes      Need for Family Participation in Patient Care: No (Comment) Care giver support system in place?: Yes (comment)      Activities of Daily Living   ADL Screening (condition at time of admission) Independently performs ADLs?: Yes (appropriate for developmental age) Is the patient deaf or have difficulty hearing?: No Does the  patient have difficulty seeing, even when wearing glasses/contacts?: No Does the patient have difficulty concentrating, remembering, or making decisions?: No  Permission Sought/Granted Permission sought to share information with : Family Supports, Oceanographer granted to share information with : Yes, Verbal Permission Granted  Share Information with NAME: son, Chyrel Masson  Permission granted to share info w AGENCY: SNFs        Emotional Assessment Appearance:: Appears stated age Attitude/Demeanor/Rapport: Gracious Affect (typically observed): Accepting, Pleasant Orientation: : Oriented to Self, Oriented to Place, Oriented to  Time, Oriented to Situation Alcohol / Substance Use: Not Applicable Psych Involvement: No (comment)  Admission diagnosis:  Pelvic fracture (HCC) [S32.9XXA] Fall, initial encounter [id number]] Closed fracture of ramus of right pubis, initial encounter [facility]) [S32.591A] Patient Active Problem List   Diagnosis Date Noted   Closed fracture of multiple pubic rami, right, initial encounter (HCC) 05/08/2023   Closed displaced fracture of right femoral neck (HCC) 05/08/2023   History of COPD 05/08/2023   GAD (generalized anxiety disorder) 05/08/2023   Fall at nursing home. 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   Dysphagia    Protein-calorie malnutrition, severe 04/10/2021   Aortic atherosclerosis (HCC) 04/04/2021   Depression with anxiety  History of colon polyps 10/17/2020   Vitamin D deficiency 10/17/2020   Dementia with behavioral disturbance 08/09/2020   Gait abnormality 08/09/2020   Chronic hypoxemic respiratory failure (HCC) 04/10/2020   Peripheral edema 05/21/2018   Gait disorder 05/21/2018   CKD stage 3a, GFR 45-59 ml/min (HCC) 04/28/2018   Malnutrition of moderate degree 04/13/2018   Essential  hypertension 04/11/2018   Gallstones 04/11/2018   Coronary artery calcification 08/23/2016   Aortic stenosis 08/23/2016   Insomnia 08/18/2015   GERD (gastroesophageal reflux disease) 08/18/2015   Back pain 10/28/2012   Asthma 09/21/2011   Allergic rhinitis 09/21/2011   Colon polyps 09/21/2011   Encounter for well adult exam with abnormal findings 09/15/2011   Spondylosis 10/21/2008   Overweight 02/10/2008   ANXIETY DEPRESSION 02/10/2008   COPD (chronic obstructive pulmonary disease) (HCC) 02/10/2008   EMPHYSEMA NEC 01/30/2007   Hyperlipidemia 12/31/2006   PCP:  Caesar Bookman, NP Pharmacy:   [facility] [place], [place] - [address] [place] [address] Phone: [phone] Fax: [phone]  [facility] [address] [place] [address] Phone: [phone] Fax: [phone]  [facility] - [place], [place] - [address] [place] [phone] Phone: [phone] Fax: [phone]  [facility] - [place], [place] - [address] [place] [address] Phone: [phone] Fax: [phone]     Social Determinants of Health (SDOH) Social History: SDOH Screenings   Food Insecurity: No Food Insecurity (05/08/2023)  Housing: Low Risk  (05/08/2023)  Transportation Needs: No Transportation Needs (05/08/2023)  Utilities: Not At Risk (05/08/2023)  Depression (PHQ2-9): Low Risk  (02/11/2023)  Tobacco Use: Medium Risk (05/09/2023)   SDOH Interventions:     Readmission Risk Interventions    05/10/2023    2:54 PM 04/11/2021    1:22 PM  Readmission Risk Prevention Plan  Transportation Screening Complete Complete  PCP or Specialist Appt within 5-7 Days Complete   PCP or Specialist Appt within 3-5 Days  Complete  Home Care Screening Complete   Medication Review (RN CM) Complete   HRI or Home Care Consult  Complete  Social Work  Consult for Recovery Care Planning/Counseling  Complete  Palliative Care Screening  Complete  Medication Review Oceanographer)  Complete

## 2023-05-10 NOTE — Evaluation (Signed)
Occupational Therapy Evaluation Patient Details Name: Andrea Larson MRN: [id number] DOB: 12-Jun-1946 Today's Date: 05/10/2023   History of Present Illness Patient is a 77 year old female with history of hypertension, GAD, dementia, CKD stage IIIb, COPD, vitamin B12 deficiency  who presented from ALF10/29/24 after a fall resulting in pain on bilateral hip.  CT of the pelvis showed acute nondisplaced right superior and inferior rami fracture, acute mildly impacted subcapital right femoral neck fracture.  Patient admitted and underwent right hip hemi arthroplasty.   Clinical Impression   Pt is limited by the below listed deficits which compromise her ADL performance and functional independence (see OT problem list). She currently requires assist for tasks such as, sit to stand, lower body dressing, and toileing at bedside commode level. She was noted to be with unsteadiness in standing and decreased insight into deficits; she required occasional cues for safety awareness. She will benefit from further OT services to maximize her safety and independence with ADLs. Patient will benefit from continued inpatient follow up therapy, <3 hours/day.        If plan is discharge home, recommend the following: Direct supervision/assist for medications management;A lot of help with walking and/or transfers;A lot of help with bathing/dressing/bathroom    Functional Status Assessment  Patient has had a recent decline in their functional status and demonstrates the ability to make significant improvements in function in a reasonable and predictable amount of time.  Equipment Recommendations  Other (comment) (defer to next level of care)    Recommendations for Other Services       Precautions / Restrictions Precautions Precautions: Fall Restrictions Weight Bearing Restrictions: No RLE Weight Bearing: Weight bearing as tolerated      Mobility Bed Mobility               General bed mobility  comments: in recliner    Transfers Overall transfer level: Needs assistance Equipment used: Rolling walker (2 wheels) Transfers: Sit to/from Stand Sit to Stand: Min assist                  Balance     Sitting balance-Leahy Scale: Fair       Standing balance-Leahy Scale: Poor               ADL either performed or assessed with clinical judgement   ADL Overall ADL's : Needs assistance/impaired Eating/Feeding: Set up;Sitting   Grooming: Set up;Supervision/safety;Sitting           Upper Body Dressing : Minimal assistance;Sitting   Lower Body Dressing: Moderate assistance;Sit to/from stand   Toilet Transfer: Moderate assistance;Rolling walker (2 wheels);BSC/3in1;Cueing for safety;Cueing for sequencing;Stand-pivot   Toileting- Clothing Manipulation and Hygiene: Moderate assistance;Sit to/from stand                            Pertinent Vitals/Pain Pain Assessment Pain Assessment: No/denies pain     Extremity/Trunk Assessment Upper Extremity Assessment Upper Extremity Assessment: Right hand dominant;Overall [facility] for tasks assessed   Lower Extremity Assessment Lower Extremity Assessment: Generalized weakness       Communication Communication Communication: No apparent difficulties   Cognition Arousal: Alert Behavior During Therapy: Anxious Overall Cognitive Status: No family/caregiver present to determine baseline cognitive functioning Area of Impairment: Memory, Safety/judgement, Awareness          Memory: Decreased short-term memory   Safety/Judgement: Decreased awareness of deficits, Decreased awareness of safety  Home Living Family/patient expects to be discharged to:: Assisted living        Home Equipment: Rollator (4 wheels);Wheelchair - manual          Prior Functioning/Environment Prior Level of Function : Patient poor historian/Family not available             Mobility Comments: She  reported mostly using no AD for ambulation, however occasional use of walker or wheelchair if needed. ADLs Comments: Pt reported being independent with ADLs.        OT Problem List: Decreased strength;Decreased activity tolerance;Impaired balance (sitting and/or standing);Decreased cognition;Decreased safety awareness;Decreased knowledge of use of DME or AE;Decreased knowledge of precautions      OT Treatment/Interventions: Self-care/ADL training;Therapeutic exercise;Energy conservation;DME and/or AE instruction;Cognitive remediation/compensation;Therapeutic activities;Balance training;Patient/family education    OT Goals(Current goals can be found in the care plan section) Acute Rehab OT Goals Patient Stated Goal: to have a bowel movement today OT Goal Formulation: With patient Time For Goal Achievement: 05/24/23 Potential to Achieve Goals: Good  OT Frequency: Min 1X/week       AM-PAC OT "6 Clicks" Daily Activity     Outcome Measure Help from another person eating meals?: None Help from another person taking care of personal grooming?: A Little Help from another person toileting, which includes using toliet, bedpan, or urinal?: A Lot Help from another person bathing (including washing, rinsing, drying)?: A Lot Help from another person to put on and taking off regular upper body clothing?: A Little Help from another person to put on and taking off regular lower body clothing?: A Lot 6 Click Score: 16   End of Session Equipment Utilized During Treatment: Rolling walker (2 wheels) Nurse Communication: Other (comment) (nurse tech and nurse informed pt on [facility])  Activity Tolerance: Patient tolerated treatment well Patient left: in chair;with call bell/phone within reach;with nursing/sitter in room  OT Visit Diagnosis: Unsteadiness on feet (R26.81);Muscle weakness (generalized) (M62.81);Other symptoms and signs involving cognitive function                Time: 1610-9604 OT Time  Calculation (min): 25 min Charges:  OT General Charges $OT Visit: 1 Visit OT Evaluation $OT Eval Moderate Complexity: 1 Mod    Keydi Giel L Ndidi Nesby, OTR/L 05/10/2023, 5:45 PM

## 2023-05-11 DIAGNOSIS — S32592A Other specified fracture of left pubis, initial encounter for closed fracture: Secondary | ICD-10-CM

## 2023-05-11 DIAGNOSIS — S32591A Other specified fracture of right pubis, initial encounter for closed fracture: Secondary | ICD-10-CM | POA: Diagnosis not present

## 2023-05-11 NOTE — Plan of Care (Signed)

## 2023-05-11 NOTE — Progress Notes (Signed)
Physical Therapy Treatment Patient Details Name: Andrea Larson MRN: [id number] DOB: Dec 02, 1945 Today's Date: 05/11/2023   History of Present Illness Patient is a 77 year old female with history of hypertension, GAD, dementia, CKD stage IIIb, COPD, vitamin B12 deficiency  who presented from ALF10[facility]24 after a fall resulting in pain on bilateral hip.  CT of the pelvis showed acute nondisplaced right superior and inferior rami fracture, acute mildly impacted subcapital right femoral neck fracture.  Patient admitted and underwent right hip hemi arthroplasty, anterior approach.    PT Comments   The patient requires mod assistance to sit up and  transfer to and from [facility] using RW. Patient "scooting" feet, not really stepping.  Patient on 4 LPM SPO2 98%, HR 108  Patient will benefit from continued inpatient follow up therapy, <3 hours/day.    If plan is discharge home, recommend the following: Two people to help with walking and/or transfers;A lot of help with bathing/dressing/bathroom;Direct supervision/assist for medications management;Assist for transportation;Help with stairs or ramp for entrance   Can travel by private vehicle     No  Equipment Recommendations  None recommended by PT    Recommendations for Other Services       Precautions / Restrictions Precautions Precautions: Fall Restrictions Weight Bearing Restrictions: No RLE Weight Bearing: Weight bearing as tolerated     Mobility  Bed Mobility   Bed Mobility: Supine to Sit     Supine to sit: Mod assist     General bed mobility comments: pt able to move  legs toward bed edge,  max assist for trunkg to sit upright.    Transfers Overall transfer level: Needs assistance Equipment used: Rolling walker (2 wheels) Transfers: Sit to/from Stand, Bed to chair/wheelchair/BSC Sit to Stand: Mod assist           General transfer comment: assistance to rise from bed and BSC, cues for hand placement     Ambulation/Gait                   Stairs             Wheelchair Mobility     Tilt Bed    Modified Rankin (Stroke Patients Only)       Balance Overall balance assessment: History of Falls, Needs assistance Sitting-balance support: Feet supported, Bilateral upper extremity supported Sitting balance-Leahy Scale: Fair     Standing balance support: During functional activity, Bilateral upper extremity supported Standing balance-Leahy Scale: Poor Standing balance comment: able to baklance to be cleaned up                            Cognition Arousal: Alert Behavior During Therapy: Anxious   Area of Impairment: Memory, Safety/judgement, Awareness                 Orientation Level: Situation, Time   Memory: Decreased short-term memory   Safety/Judgement: Decreased awareness of deficits, Decreased awareness of safety     General Comments: pleasant, anxious, initially declined then needed to go To [facility] and  participated.        Exercises      General Comments        Pertinent Vitals/Pain Pain Assessment Faces Pain Scale: Hurts even more Pain Location: right hip medially Pain Descriptors / Indicators: Grimacing, Moaning, Discomfort Pain Intervention(s): Monitored during session, Premedicated before session    Home Living  Prior Function            PT Goals (current goals can now be found in the care plan section) Progress towards PT goals: Progressing toward goals    Frequency    Min 1X/week      PT Plan      Co-evaluation              AM-PAC PT "6 Clicks" Mobility   Outcome Measure  Help needed turning from your back to your side while in a flat bed without using bedrails?: A Lot Help needed moving from lying on your back to sitting on the side of a flat bed without using bedrails?: A Lot Help needed moving to and from a bed to a chair (including a wheelchair)?: A Lot    Help needed to walk in hospital room?: Total Help needed climbing 3-5 steps with a railing? : Total 6 Click Score: 8    End of Session   Activity Tolerance: Patient tolerated treatment well Patient left: in bed;with call bell/phone within reach;with bed alarm set;with family/visitor present Nurse Communication: Mobility status PT Visit Diagnosis: Unsteadiness on feet (R26.81);Muscle weakness (generalized) (M62.81);Difficulty in walking, not elsewhere classified (R26.2);History of falling (Z91.81)     Time: 1205-1223 PT Time Calculation (min) (ACUTE ONLY): 18 min  Charges:    $Therapeutic Activity: 8-22 mins PT General Charges $$ ACUTE PT VISIT: 1 Visit                     Blanchard Kelch PT Acute Rehabilitation Services Office [phone] Weekend [phone]    Rada Hay 05/11/2023, 2:03 PM

## 2023-05-11 NOTE — Progress Notes (Signed)
PROGRESS NOTE  Andrea Larson  ZOX:[id number] DOB: Aug 04, 1945 DOA: 05/07/2023 PCP: Caesar Bookman, NP   Brief Narrative: Patient is a 77 year old female with past medical history significant for hypertension, GAD, dementia, CKD stage IIIb, COPD, vitamin B12 deficiency  who presented from ALF after a fall resulting in pain on bilateral hip.  No history of head injury.  On presenation, patient was hemodynamically stable with mild sinus tachycardia.  Lab work showed WC count of 14.9.  CT of the pelvis showed acute nondisplaced right superior and inferior rami fracture, acute mildly impacted subcapital right femoral neck fracture.  Patient admitted and underwent right hip hemi arthroplasty.  Postop day 1.   05/11/2023: Patient seen.  Patient is awaiting disposition.    Assessment & Plan:  Principal Problem:   Pelvic fracture (HCC) Active Problems:   Hyperlipidemia   Essential hypertension   CKD stage 3a, GFR 45-59 ml/min (HCC)   B12 deficiency   Closed fracture of multiple pubic rami, right, initial encounter (HCC)   Closed displaced fracture of right femoral neck (HCC)   History of COPD   GAD (generalized anxiety disorder)   Fall at nursing home.   Closed traumatic right femoral neck fracture/right superior and inferior pubic rami fracture:  Status post right Hemi hip arthroplasty.  Day 1. Weightbearing as tolerated. DVT prophylaxis aspirin 325 mg daily. Adequate pain management. Conservative management of pubic rami fractures. Probably will need a skilled level of care.   05/11/2023: Continue PT OT.  Pursue disposition.     Hypertension:  -Blood pressures are adequate controlled. -Continue to hold antihypertensives if necessary.     Leukocytosis: Most likely reactive.  No infectious signs.  Normalized.   AKI on CKD stage IIIa:  -Baseline creatinine around 1.2.  Kidney function bumped up to the range of 2 and she was given IV fluids.  Trending down.  Discontinue IV fluids.   Encourage oral intake.  Foley has been removed.   05/11/2023: AKI is improving.     Hyperlipidemia: On Crestor   History of COPD/chronic hypoxic respiratory failure: On 2 L of oxygen at home at night only.    Takes Singulair, Trelegy .  Patient was empirically started on antibiotics.  Will can monitor today.   History of dementia: On donepezil, memantine.  Continue delirium precaution, frequent reorientation.  Alert and oriented this morning.  Fall precautions.  Delirium precautions.   History of GAD: On bupropion, Prozac, Remeron   Vitamin B12 deficiency: continue supplementation  Anemia: Of acute blood loss.  As anticipated from long bone fracture, pelvic fracture and hemodilution.  Will monitor.  Currently no indication for transfusion.       DVT prophylaxis:SCDs Start: 05/09/23 1904 SCDs Start: 05/08/23 0559     Code Status: Limited: Do not attempt resuscitation (DNR) -DNR-LIMITED -Do Not Intubate/DNI   Family Communication:: None today.  Patient status: Inpatient  Patient is from : ALF  Anticipated discharge to: SNF  Estimated DC date: 2 to 3 days   Consultants: Orthopedics  Procedures: Right hip  Antimicrobials:  Anti-infectives (From admission, onward)    Start     Dose/Rate Route Frequency Ordered Stop   05/10/23 2200  azithromycin (ZITHROMAX) tablet 500 mg        500 mg Oral Daily at bedtime 05/10/23 0748 05/14/23 2159   05/10/23 0400  ceFAZolin (ANCEF) IVPB 1 g/50 mL premix        1 g 100 mL/hr over 30 Minutes Intravenous  Once 05/09/23 1912  05/10/23 0405   05/09/23 2000  cefTRIAXone (ROCEPHIN) 2 g in sodium chloride 0.9 % 100 mL IVPB        2 g 200 mL/hr over 30 Minutes Intravenous Every 24 hours 05/09/23 1318     05/09/23 2000  azithromycin (ZITHROMAX) 500 mg in dextrose 5 % 250 mL IVPB  Status:  Discontinued        500 mg 250 mL/hr over 60 Minutes Intravenous Every 24 hours 05/09/23 1318 05/10/23 0748   05/09/23 2000  ceFAZolin (ANCEF) IVPB 2g/100 mL  premix  Status:  Discontinued        2 g 200 mL/hr over 30 Minutes Intravenous Every 6 hours 05/09/23 1903 05/09/23 1912   05/09/23 1215  ceFAZolin (ANCEF) IVPB 2g/100 mL premix        2 g 200 mL/hr over 30 Minutes Intravenous On call to O.R. 05/09/23 1205 05/09/23 1630       Subjective: -Patient seen -No new complaints. -Awaiting disposition.  Objective: Vitals:   05/10/23 2029 05/10/23 2204 05/11/23 0448 05/11/23 0905  BP: 113/71  110/62   Pulse: (!) 108  (!) 101   Resp: 17  17   Temp: 98.3 F (36.8 C)  98.6 F (37 C)   TempSrc: Oral  Oral   SpO2: 98% 97% 96% 96%  Weight:      Height:        Intake/Output Summary (Last 24 hours) at 05/11/2023 1155 Last data filed at 05/11/2023 0000 Gross per 24 hour  Intake 480 ml  Output --  Net 480 ml   Filed Weights   05/07/23 1847  Weight: 53 kg    Examination:  General: Awake and alert.  Not in any distress.   HEENT: Patient is pale.  No jaundice. Cardiovascular: S1-S2. Respiratory: Clear to auscultation.   Gastrointestinal: Soft and nontender. Ext: No edema. Neuro: Awake and alert.  Data Reviewed: I have personally reviewed following labs and imaging studies  CBC: Recent Labs  Lab 05/07/23 2058 05/09/23 0820 05/10/23 0332  WBC 14.9* 11.8* 9.9  NEUTROABS 12.4*  --   --   HGB 13.4 10.9* 9.3*  HCT 41.9 35.4* 28.6*  MCV 91.7 92.9 93.5  PLT 274 205 157   Basic Metabolic Panel: Recent Labs  Lab 05/07/23 2058 05/09/23 0820 05/10/23 0332  NA 137 130* 129*  K 4.7 4.9 4.8  CL 99 97* 95*  CO2 30 24 26   GLUCOSE 94 136* 152*  BUN 17 44* 37*  CREATININE 1.10* 2.23* 1.53*  CALCIUM 9.4 8.1* 7.9*     No results found for this or any previous visit (from the past 240 hour(s)).   Radiology Studies: DG Pelvis Portable  Result Date: 05/09/2023 CLINICAL DATA:  Right hip replacement.  [id number].  Postoperative. EXAM: PORTABLE PELVIS 1-2 VIEWS COMPARISON:  CT of the pelvis 05/07/2023 FINDINGS: Skin staples overlie  the right hip region. The right femoral head and neck have been resected. A total right hip replacement has been inserted. No acute hardware complication is seen. Hardware alignment appears physiologic on this single view. Osteopenia. Recent fractures of the right superior and inferior pubic rami are again shown with mild displacement. No new fracture has become apparent. The proximal left femur is unremarkable. There is spurring of the SI joints and pubic symphysis. Patchy calcific plaques both superficial femoral arteries. Patchy soft tissue gas extends over the operative bed on the right. IMPRESSION: 1. Status post total right hip replacement without evidence of acute hardware complication.  2. Recent fractures of the right superior and inferior pubic rami are again shown with mild displacement. 3. Osteopenia and degenerative change. 4. Atherosclerosis. Electronically Signed   By: Almira Bar M.D.   On: 05/09/2023 22:36   DG HIP UNILAT WITH PELVIS 1V RIGHT  Result Date: 05/09/2023 CLINICAL DATA:  Right hip arthroplasty. EXAM: DG HIP (WITH OR WITHOUT PELVIS) 1V RIGHT COMPARISON:  Pelvic CT dated 05/07/2023. FINDINGS: Six intraoperative fluoroscopic spot images of the right hip provided. The cumulative air Karma is 1.01 mGy. There has been interval placement of a right hip arthroplasty. IMPRESSION: Intraoperative fluoroscopic spot images of the right hip. Electronically Signed   By: Elgie Collard M.D.   On: 05/09/2023 21:41   DG C-Arm 1-60 Min-No Report  Result Date: 05/09/2023 Fluoroscopy was utilized by the requesting physician.  No radiographic interpretation.    Scheduled Meds:  aspirin EC  325 mg Oral Daily   azithromycin  500 mg Oral QHS   buPROPion  150 mg Oral Daily   Chlorhexidine Gluconate Cloth  6 each Topical Daily   cyanocobalamin  1,000 mcg Oral Daily   docusate sodium  100 mg Oral BID   donepezil  5 mg Oral QHS   feeding supplement  237 mL Oral BID BM   FLUoxetine  40 mg  Oral Daily   fluticasone  1 spray Each Nare Daily   fluticasone furoate-vilanterol  1 puff Inhalation Daily   And   umeclidinium bromide  1 puff Inhalation Daily   folic acid  1 mg Oral Daily   ipratropium-albuterol  3 mL Nebulization BID   memantine  10 mg Oral BID   mirtazapine  15 mg Oral QHS   montelukast  10 mg Oral Daily   pantoprazole  40 mg Oral Daily   rosuvastatin  20 mg Oral Daily   sodium chloride flush  3 mL Intravenous Q12H   Continuous Infusions:  cefTRIAXone (ROCEPHIN)  IV Stopped (05/10/23 2016)     LOS: 3 days   Barnetta Chapel, MD [facility] P11/08/2022, 11:55 AM

## 2023-05-12 DIAGNOSIS — S32301A Unspecified fracture of right ilium, initial encounter for closed fracture: Secondary | ICD-10-CM

## 2023-05-12 NOTE — Progress Notes (Signed)
PROGRESS NOTE  Andrea LECCESE  ZOX:[id number] DOB: 07/11/1945 DOA: 05/07/2023 PCP: Caesar Bookman, NP   Brief Narrative: Patient is a 77 year old female with past medical history significant for hypertension, GAD, dementia, CKD stage IIIb, COPD, vitamin B12 deficiency  who presented from ALF after a fall resulting in pain on bilateral hip.  No history of head injury.  On presenation, patient was hemodynamically stable with mild sinus tachycardia.  Lab work showed WC count of 14.9.  CT of the pelvis showed acute nondisplaced right superior and inferior rami fracture, acute mildly impacted subcapital right femoral neck fracture.  Patient admitted and underwent right hip hemi arthroplasty.    05/12/2023: Patient seen.  Patient is awaiting disposition.    Assessment & Plan:  Principal Problem:   Pelvic fracture (HCC) Active Problems:   Hyperlipidemia   Essential hypertension   CKD stage 3a, GFR 45-59 ml/min (HCC)   B12 deficiency   Closed fracture of multiple pubic rami, right, initial encounter (HCC)   Closed displaced fracture of right femoral neck (HCC)   History of COPD   GAD (generalized anxiety disorder)   Fall at nursing home.   Closed traumatic right femoral neck fracture/right superior and inferior pubic rami fracture:  -Status post right Hemi hip arthroplasty.   -Weightbearing as tolerated. -DVT prophylaxis aspirin 325 mg daily. -Adequate pain management. -Conservative management of pubic rami fractures. -Probably will need a skilled level of care.   05/12/2023: Pursue disposition.     Hypertension:  -Blood pressures are adequate controlled. -Continue to hold antihypertensives if necessary.     Leukocytosis: Most likely reactive.  No infectious signs.  Normalized.   AKI on CKD stage IIIa:  -Baseline creatinine around 1.2.   -Slowly resolving -Last Scr 1.53.    Hyperlipidemia: On Crestor   History of COPD/chronic hypoxic respiratory failure:  -On 2 L of oxygen  at home at night only.     -Stable -Continue current regimen.   History of dementia:  -No behavioral problems -On donepezil and memantine.     History of GAD: On bupropion, Prozac, Remeron   Vitamin B12 deficiency: continue supplementation  Anemia:  -Likely multifactorial.   DVT prophylaxis:SCDs Start: 05/09/23 1904 SCDs Start: 05/08/23 0559     Code Status: Limited: Do not attempt resuscitation (DNR) -DNR-LIMITED -Do Not Intubate/DNI   Family Communication:: None today.  Patient status: Inpatient  Patient is from : ALF  Anticipated discharge to: SNF  Consultants: Orthopedics  Procedures: Right hip  Antimicrobials:  Anti-infectives (From admission, onward)    Start     Dose/Rate Route Frequency Ordered Stop   05/10/23 2200  azithromycin (ZITHROMAX) tablet 500 mg        500 mg Oral Daily at bedtime 05/10/23 0748 05/14/23 2159   05/10/23 0400  ceFAZolin (ANCEF) IVPB 1 g/50 mL premix        1 g 100 mL/hr over 30 Minutes Intravenous  Once 05/09/23 1912 05/10/23 0405   05/09/23 2000  cefTRIAXone (ROCEPHIN) 2 g in sodium chloride 0.9 % 100 mL IVPB        2 g 200 mL/hr over 30 Minutes Intravenous Every 24 hours 05/09/23 1318     05/09/23 2000  azithromycin (ZITHROMAX) 500 mg in dextrose 5 % 250 mL IVPB  Status:  Discontinued        500 mg 250 mL/hr over 60 Minutes Intravenous Every 24 hours 05/09/23 1318 05/10/23 0748   05/09/23 2000  ceFAZolin (ANCEF) IVPB 2g/100 mL premix  Status:  Discontinued        2 g 200 mL/hr over 30 Minutes Intravenous Every 6 hours 05/09/23 1903 05/09/23 1912   05/09/23 1215  ceFAZolin (ANCEF) IVPB 2g/100 mL premix        2 g 200 mL/hr over 30 Minutes Intravenous On call to O.R. 05/09/23 1205 05/09/23 1630       Subjective: -Patient seen -No new complaints. -Awaiting disposition.  Objective: Vitals:   05/11/23 2148 05/11/23 2155 05/12/23 0558 05/12/23 0840  BP: (!) 98/54  106/62   Pulse: 87  (!) 103   Resp: 18  18   Temp:   98  F (36.7 C)   TempSrc:   Oral   SpO2: 100% 96% 100% 99%  Weight:      Height:        Intake/Output Summary (Last 24 hours) at 05/12/2023 1648 Last data filed at 05/12/2023 1400 Gross per 24 hour  Intake 400 ml  Output 500 ml  Net -100 ml   Filed Weights   05/07/23 1847  Weight: 53 kg    Examination:  General: Awake and alert.  Not in any distress.   HEENT: Patient is pale.  No jaundice. Cardiovascular: S1-S2. Respiratory: Clear to auscultation.   Gastrointestinal: Soft and nontender. Ext: No edema. Neuro: Awake and alert.  Data Reviewed: I have personally reviewed following labs and imaging studies  CBC: Recent Labs  Lab 05/07/23 2058 05/09/23 0820 05/10/23 0332  WBC 14.9* 11.8* 9.9  NEUTROABS 12.4*  --   --   HGB 13.4 10.9* 9.3*  HCT 41.9 35.4* 28.6*  MCV 91.7 92.9 93.5  PLT 274 205 157   Basic Metabolic Panel: Recent Labs  Lab 05/07/23 2058 05/09/23 0820 05/10/23 0332  NA 137 130* 129*  K 4.7 4.9 4.8  CL 99 97* 95*  CO2 30 24 26   GLUCOSE 94 136* 152*  BUN 17 44* 37*  CREATININE 1.10* 2.23* 1.53*  CALCIUM 9.4 8.1* 7.9*     No results found for this or any previous visit (from the past 240 hour(s)).   Radiology Studies: No results found.  Scheduled Meds:  aspirin EC  325 mg Oral Daily   azithromycin  500 mg Oral QHS   buPROPion  150 mg Oral Daily   Chlorhexidine Gluconate Cloth  6 each Topical Daily   cyanocobalamin  1,000 mcg Oral Daily   docusate sodium  100 mg Oral BID   donepezil  5 mg Oral QHS   feeding supplement  237 mL Oral BID BM   FLUoxetine  40 mg Oral Daily   fluticasone  1 spray Each Nare Daily   fluticasone furoate-vilanterol  1 puff Inhalation Daily   And   umeclidinium bromide  1 puff Inhalation Daily   folic acid  1 mg Oral Daily   ipratropium-albuterol  3 mL Nebulization BID   memantine  10 mg Oral BID   mirtazapine  15 mg Oral QHS   montelukast  10 mg Oral Daily   pantoprazole  40 mg Oral Daily   rosuvastatin  20  mg Oral Daily   sodium chloride flush  3 mL Intravenous Q12H   Continuous Infusions:  cefTRIAXone (ROCEPHIN)  IV 2 g (05/11/23 2121)     LOS: 4 days   Barnetta Chapel, MD [facility] P11/09/2022, 4:48 PM

## 2023-05-12 NOTE — Plan of Care (Signed)
  Problem: Coping: Goal: Level of anxiety will decrease Outcome: Progressing   Problem: Pain Management: Goal: General experience of comfort will improve Outcome: Progressing   Problem: Safety: Goal: Ability to remain free from injury will improve Outcome: Progressing

## 2023-05-13 DIAGNOSIS — S72001A Fracture of unspecified part of neck of right femur, initial encounter for closed fracture: Secondary | ICD-10-CM | POA: Diagnosis not present

## 2023-05-13 NOTE — TOC Progression Note (Signed)
Transition of Care [facility]) - Progression Note    Patient Details  Name: Andrea Larson MRN: [id number] Date of Birth: 10/26/1945  [facility] CM/SW Contact  Amada Jupiter, LCSW Phone Number: 05/13/2023, 3:05 PM  Clinical Narrative:     Have reviewed bed offers with pt/ son and bed accepted at [facility] who can admit pt tomorrow.  Have received insurance authorization.  MD/RN aware.  Expected Discharge Plan: Skilled Nursing Facility Barriers to Discharge: Insurance Authorization, SNF Pending bed offer  Expected Discharge Plan and Services In-house Referral: Clinical Social Work   Post Acute Care Choice: Skilled Nursing Facility Living arrangements for the past 2 months: Assisted Living Facility ([facility] since March 2024)                 DME Arranged: N/A DME Agency: NA                   Social Determinants of Health (SDOH) Interventions SDOH Screenings   Food Insecurity: No Food Insecurity (05/08/2023)  Housing: Low Risk  (05/08/2023)  Transportation Needs: No Transportation Needs (05/08/2023)  Utilities: Not At Risk (05/08/2023)  Depression (PHQ2-9): Low Risk  (02/11/2023)  Tobacco Use: Medium Risk (05/09/2023)    Readmission Risk Interventions    05/10/2023    2:54 PM 04/11/2021    1:22 PM  Readmission Risk Prevention Plan  Transportation Screening Complete Complete  PCP or Specialist Appt within 5-7 Days Complete   PCP or Specialist Appt within 3-5 Days  Complete  Home Care Screening Complete   Medication Review (RN CM) Complete   HRI or Home Care Consult  Complete  Social Work Consult for Recovery Care Planning/Counseling  Complete  Palliative Care Screening  Complete  Medication Review Ocean[employer])  Complete

## 2023-05-13 NOTE — Plan of Care (Signed)
  Problem: Coping: Goal: Level of anxiety will decrease Outcome: Progressing   Problem: Pain Management: Goal: General experience of comfort will improve Outcome: Progressing   Problem: Safety: Goal: Ability to remain free from injury will improve Outcome: Progressing

## 2023-05-13 NOTE — Progress Notes (Signed)
PROGRESS NOTE    Andrea Larson  NFA:[id number] DOB: 09/02/1945 DOA: 05/07/2023 PCP: Caesar Bookman, NP    Brief Narrative:  77 year old with traumatic right hip fracture.  Status post ORIF.  Waiting to go to SNF.  Subjective: Stable.  Denies any complaints.  Denies even having any pain. Assessment & Plan:  Closed traumatic right femoral neck fracture, right superior inferior pubic rami fracture Status post right Hemi hip arthroplasty Weightbearing as tolerated DVT prophylaxis with aspirin Adequate pain management along with laxatives Conservative management for pubic rami fractures Ortho to schedule follow-up Waiting to go to SNF  Chronic medical issues.  Stable.   DVT prophylaxis: SCDs Start: 05/09/23 1904 SCDs Start: 05/08/23 0559   Code Status: DNR Family Communication: None at the bedside Disposition Plan: Status is: Inpatient Remains inpatient appropriate because: Medically stable.  Waiting for SNF     Consultants:  Orthopedics  Procedures:  Right hip hemiarthroplasty  Antimicrobials:  None     Objective: Vitals:   05/13/23 0500 05/13/23 0540 05/13/23 0714 05/13/23 0719  BP:  118/60    Pulse:  78    Resp:  17    Temp:  97.7 F (36.5 C)    TempSrc:  Oral    SpO2:  100% 100% 100%  Weight: 55.2 kg     Height:        Intake/Output Summary (Last 24 hours) at 05/13/2023 1111 Last data filed at 05/13/2023 0540 Gross per 24 hour  Intake 400 ml  Output --  Net 400 ml   Filed Weights   05/07/23 1847 05/13/23 0500  Weight: 53 kg 55.2 kg    Examination:  General exam: Appears calm and comfortable  Respiratory system: Clear to auscultation. Respiratory effort normal. Cardiovascular system: S1 & S2 heard,  Gastrointestinal system: Soft nontender.  Bowel sound present  Right anterior thigh incision clean and dry.  Nontender.  Data Reviewed: I have personally reviewed following labs and imaging studies  CBC: Recent Labs  Lab  05/07/23 2058 05/09/23 0820 05/10/23 0332  WBC 14.9* 11.8* 9.9  NEUTROABS 12.4*  --   --   HGB 13.4 10.9* 9.3*  HCT 41.9 35.4* 28.6*  MCV 91.7 92.9 93.5  PLT 274 205 157   Basic Metabolic Panel: Recent Labs  Lab 05/07/23 2058 05/09/23 0820 05/10/23 0332  NA 137 130* 129*  K 4.7 4.9 4.8  CL 99 97* 95*  CO2 30 24 26   GLUCOSE 94 136* 152*  BUN 17 44* 37*  CREATININE 1.10* 2.23* 1.53*  CALCIUM 9.4 8.1* 7.9*   GFR: Estimated Creatinine Clearance: 23.6 mL/min (A) (by C-G formula based on SCr of 1.53 mg/dL (H)). Liver Function Tests: Recent Labs  Lab 05/07/23 2058 05/09/23 0820  AST 30 19  ALT 25 19  ALKPHOS 69 54  BILITOT 0.7 0.3  PROT 6.9 6.0*  ALBUMIN 3.3* 2.9*   No results for input(s): "LIPASE", "AMYLASE" in the last 168 hours. No results for input(s): "AMMONIA" in the last 168 hours. Coagulation Profile: No results for input(s): "INR", "PROTIME" in the last 168 hours. Cardiac Enzymes: No results for input(s): "CKTOTAL", "CKMB", "CKMBINDEX", "TROPONINI" in the last 168 hours. BNP (last 3 results) No results for input(s): "PROBNP" in the last 8760 hours. HbA1C: No results for input(s): "HGBA1C" in the last 72 hours. CBG: No results for input(s): "GLUCAP" in the last 168 hours. Lipid Profile: No results for input(s): "CHOL", "HDL", "LDLCALC", "TRIG", "CHOLHDL", "LDLDIRECT" in the last 72 hours. Thyroid Function  Tests: No results for input(s): "TSH", "T4TOTAL", "FREET4", "T3FREE", "THYROIDAB" in the last 72 hours. Anemia Panel: No results for input(s): "VITAMINB12", "FOLATE", "FERRITIN", "TIBC", "IRON", "RETICCTPCT" in the last 72 hours. Sepsis Labs: No results for input(s): "PROCALCITON", "LATICACIDVEN" in the last 168 hours.  No results found for this or any previous visit (from the past 240 hour(s)).       Radiology Studies: No results found.      Scheduled Meds:  aspirin EC  325 mg Oral Daily   azithromycin  500 mg Oral QHS   buPROPion  150  mg Oral Daily   cyanocobalamin  1,000 mcg Oral Daily   docusate sodium  100 mg Oral BID   donepezil  5 mg Oral QHS   feeding supplement  237 mL Oral BID BM   FLUoxetine  40 mg Oral Daily   fluticasone  1 spray Each Nare Daily   fluticasone furoate-vilanterol  1 puff Inhalation Daily   And   umeclidinium bromide  1 puff Inhalation Daily   folic acid  1 mg Oral Daily   memantine  10 mg Oral BID   mirtazapine  15 mg Oral QHS   montelukast  10 mg Oral Daily   pantoprazole  40 mg Oral Daily   rosuvastatin  20 mg Oral Daily   sodium chloride flush  3 mL Intravenous Q12H   Continuous Infusions:     LOS: 5 days    Time spent: 35 minutes    Dorcas Carrow, MD [facility]

## 2023-05-13 NOTE — Progress Notes (Signed)
Physical Therapy Treatment Patient Details Name: Andrea Larson MRN: [id number] DOB: 03-Jul-1946 Today's Date: 05/13/2023   History of Present Illness Patient is a 77 year old female with history of hypertension, GAD, dementia, CKD stage IIIb, COPD, vitamin B12 deficiency  who presented from [facility] after a fall resulting in pain on bilateral hip.  CT of the pelvis showed acute nondisplaced right superior and inferior rami fracture, acute mildly impacted subcapital right femoral neck fracture.  Patient admitted and underwent right hip hemi arthroplasty, anterior approach.    PT Comments  Pt pre-medicated with Norco prior to PT session. Received in bed on 2L O2, 100% SpO2 at rest and after assisting to EOB. Pt placed on RA for sit<>stand and short distance gait with RW ~3 ft followed closely with chair. SpO2 97% upon exertion on RA, slight increase in SOB. Pt demonstrates poor tolerance to wt shift onto R LE with heavy reliance on RW due to pain. Will try to progress mobility as pain level decreases. D/C recommendations remain appropriate.    If plan is discharge home, recommend the following: Two people to help with walking and/or transfers;A lot of help with bathing/dressing/bathroom;Direct supervision/assist for medications management;Assist for transportation;Help with stairs or ramp for entrance   Can travel by private vehicle     No  Equipment Recommendations  None recommended by PT    Recommendations for Other Services       Precautions / Restrictions Precautions Precautions: Fall Restrictions Weight Bearing Restrictions: Yes RLE Weight Bearing: Weight bearing as tolerated     Mobility  Bed Mobility Overal bed mobility: Needs Assistance Bed Mobility: Supine to Sit     Supine to sit: Mod assist     General bed mobility comments: pt able to move  legs toward bed edge,  max assist for trunkg to sit upright.    Transfers Overall transfer level: Needs  assistance Equipment used: Rolling walker (2 wheels) Transfers: Sit to/from Stand Sit to Stand: Mod assist           General transfer comment: assistance to rise from bed, cues for hand placement    Ambulation/Gait Ambulation/Gait assistance: Mod assist Gait Distance (Feet): 3 Feet Assistive device: Rolling walker (2 wheels) Gait Pattern/deviations: Step-to pattern, Antalgic, Decreased weight shift to right, Shuffle Gait velocity: decreased     General Gait Details: Poor tolerance for wt shifting onto Right LE   Stairs             Wheelchair Mobility     Tilt Bed    Modified Rankin (Stroke Patients Only)       Balance Overall balance assessment: History of Falls, Needs assistance Sitting-balance support: Feet supported, Bilateral upper extremity supported Sitting balance-Leahy Scale: Fair     Standing balance support: Bilateral upper extremity supported, During functional activity, Reliant on assistive device for balance Standing balance-Leahy Scale: Poor Standing balance comment: High fall risk/buckling due to poor wt acceptance through R LE                            Cognition Arousal: Alert Behavior During Therapy: Anxious Overall Cognitive Status: No family/caregiver present to determine baseline cognitive functioning Area of Impairment: Memory, Safety/judgement, Awareness                 Orientation Level: Situation, Time   Memory: Decreased short-term memory   Safety/Judgement: Decreased awareness of deficits, Decreased awareness of safety Awareness: Anticipatory   General Comments: Pleasant,  pain limited        Exercises General Exercises - Lower Extremity Ankle Circles/Pumps: AROM, Both, 10 reps, Supine Heel Slides: AROM, Left, AAROM, Right, 10 reps, Supine    General Comments General comments (skin integrity, edema, etc.): Pt incontinent of urine upon arrival. Pt educated on POC and role of PT      Pertinent  Vitals/Pain Pain Assessment Pain Assessment: 0-10 Pain Score: 6  Pain Location: right hip medially Pain Descriptors / Indicators: Grimacing, Moaning, Discomfort Pain Intervention(s): Premedicated before session    Home Living                          Prior Function            PT Goals (current goals can now be found in the care plan section) Acute Rehab PT Goals Patient Stated Goal: to walk    Frequency    Min 1X/week      PT Plan      Co-evaluation              AM-PAC PT "6 Clicks" Mobility   Outcome Measure  Help needed turning from your back to your side while in a flat bed without using bedrails?: A Lot Help needed moving from lying on your back to sitting on the side of a flat bed without using bedrails?: A Lot Help needed moving to and from a bed to a chair (including a wheelchair)?: A Lot Help needed standing up from a chair using your arms (e.g., wheelchair or bedside chair)?: A Lot Help needed to walk in hospital room?: Total Help needed climbing 3-5 steps with a railing? : Total 6 Click Score: 10    End of Session Equipment Utilized During Treatment: Gait belt;Oxygen (2L O2) Activity Tolerance: Patient limited by pain Patient left: in chair;with call bell/phone within reach;with chair alarm set;with nursing/sitter in room Nurse Communication: Mobility status PT Visit Diagnosis: Unsteadiness on feet (R26.81);Muscle weakness (generalized) (M62.81);Difficulty in walking, not elsewhere classified (R26.2);History of falling (Z91.81)     Time: 5409-8119 PT Time Calculation (min) (ACUTE ONLY): 27 min  Charges:    $Therapeutic Exercise: 8-22 mins $Therapeutic Activity: 8-22 mins PT General Charges $$ ACUTE PT VISIT: 1 Visit                    Zadie Cleverly, PTA  Jannet Askew 05/13/2023, 12:10 PM

## 2023-05-14 DIAGNOSIS — I441 Atrioventricular block, second degree: Secondary | ICD-10-CM | POA: Diagnosis not present

## 2023-05-14 DIAGNOSIS — I352 Nonrheumatic aortic (valve) stenosis with insufficiency: Secondary | ICD-10-CM | POA: Diagnosis present

## 2023-05-14 DIAGNOSIS — R1310 Dysphagia, unspecified: Secondary | ICD-10-CM | POA: Diagnosis not present

## 2023-05-14 DIAGNOSIS — F411 Generalized anxiety disorder: Secondary | ICD-10-CM | POA: Diagnosis not present

## 2023-05-14 DIAGNOSIS — E559 Vitamin D deficiency, unspecified: Secondary | ICD-10-CM | POA: Diagnosis not present

## 2023-05-14 DIAGNOSIS — J4489 Other specified chronic obstructive pulmonary disease: Secondary | ICD-10-CM | POA: Diagnosis not present

## 2023-05-14 DIAGNOSIS — R410 Disorientation, unspecified: Secondary | ICD-10-CM | POA: Diagnosis not present

## 2023-05-14 DIAGNOSIS — N1831 Chronic kidney disease, stage 3a: Secondary | ICD-10-CM | POA: Diagnosis not present

## 2023-05-14 DIAGNOSIS — F0390 Unspecified dementia without behavioral disturbance: Secondary | ICD-10-CM | POA: Diagnosis not present

## 2023-05-14 DIAGNOSIS — J410 Simple chronic bronchitis: Secondary | ICD-10-CM | POA: Diagnosis not present

## 2023-05-14 DIAGNOSIS — F03918 Unspecified dementia, unspecified severity, with other behavioral disturbance: Secondary | ICD-10-CM | POA: Diagnosis not present

## 2023-05-14 DIAGNOSIS — R278 Other lack of coordination: Secondary | ICD-10-CM | POA: Diagnosis not present

## 2023-05-14 DIAGNOSIS — R457 State of emotional shock and stress, unspecified: Secondary | ICD-10-CM | POA: Diagnosis not present

## 2023-05-14 DIAGNOSIS — B952 Enterococcus as the cause of diseases classified elsewhere: Secondary | ICD-10-CM | POA: Diagnosis present

## 2023-05-14 DIAGNOSIS — G9341 Metabolic encephalopathy: Secondary | ICD-10-CM | POA: Diagnosis not present

## 2023-05-14 DIAGNOSIS — K5909 Other constipation: Secondary | ICD-10-CM | POA: Diagnosis present

## 2023-05-14 DIAGNOSIS — I959 Hypotension, unspecified: Secondary | ICD-10-CM | POA: Diagnosis not present

## 2023-05-14 DIAGNOSIS — K8020 Calculus of gallbladder without cholecystitis without obstruction: Secondary | ICD-10-CM | POA: Diagnosis not present

## 2023-05-14 DIAGNOSIS — S72041D Displaced fracture of base of neck of right femur, subsequent encounter for closed fracture with routine healing: Secondary | ICD-10-CM | POA: Diagnosis not present

## 2023-05-14 DIAGNOSIS — F0394 Unspecified dementia, unspecified severity, with anxiety: Secondary | ICD-10-CM | POA: Diagnosis not present

## 2023-05-14 DIAGNOSIS — W19XXXD Unspecified fall, subsequent encounter: Secondary | ICD-10-CM | POA: Diagnosis present

## 2023-05-14 DIAGNOSIS — I4892 Unspecified atrial flutter: Secondary | ICD-10-CM | POA: Diagnosis not present

## 2023-05-14 DIAGNOSIS — R0682 Tachypnea, not elsewhere classified: Secondary | ICD-10-CM | POA: Diagnosis not present

## 2023-05-14 DIAGNOSIS — R2689 Other abnormalities of gait and mobility: Secondary | ICD-10-CM | POA: Diagnosis not present

## 2023-05-14 DIAGNOSIS — Z1152 Encounter for screening for COVID-19: Secondary | ICD-10-CM | POA: Diagnosis not present

## 2023-05-14 DIAGNOSIS — I129 Hypertensive chronic kidney disease with stage 1 through stage 4 chronic kidney disease, or unspecified chronic kidney disease: Secondary | ICD-10-CM | POA: Diagnosis present

## 2023-05-14 DIAGNOSIS — Z96641 Presence of right artificial hip joint: Secondary | ICD-10-CM | POA: Diagnosis not present

## 2023-05-14 DIAGNOSIS — B957 Other staphylococcus as the cause of diseases classified elsewhere: Secondary | ICD-10-CM | POA: Diagnosis present

## 2023-05-14 DIAGNOSIS — Z66 Do not resuscitate: Secondary | ICD-10-CM | POA: Diagnosis not present

## 2023-05-14 DIAGNOSIS — R61 Generalized hyperhidrosis: Secondary | ICD-10-CM | POA: Diagnosis not present

## 2023-05-14 DIAGNOSIS — M6281 Muscle weakness (generalized): Secondary | ICD-10-CM | POA: Diagnosis not present

## 2023-05-14 DIAGNOSIS — K429 Umbilical hernia without obstruction or gangrene: Secondary | ICD-10-CM | POA: Diagnosis not present

## 2023-05-14 DIAGNOSIS — Z8249 Family history of ischemic heart disease and other diseases of the circulatory system: Secondary | ICD-10-CM | POA: Diagnosis not present

## 2023-05-14 DIAGNOSIS — N183 Chronic kidney disease, stage 3 unspecified: Secondary | ICD-10-CM | POA: Diagnosis not present

## 2023-05-14 DIAGNOSIS — R000 Tachycardia, unspecified: Secondary | ICD-10-CM | POA: Diagnosis not present

## 2023-05-14 DIAGNOSIS — E876 Hypokalemia: Secondary | ICD-10-CM | POA: Diagnosis present

## 2023-05-14 DIAGNOSIS — I499 Cardiac arrhythmia, unspecified: Secondary | ICD-10-CM | POA: Diagnosis not present

## 2023-05-14 DIAGNOSIS — S32591A Other specified fracture of right pubis, initial encounter for closed fracture: Secondary | ICD-10-CM | POA: Diagnosis not present

## 2023-05-14 DIAGNOSIS — F418 Other specified anxiety disorders: Secondary | ICD-10-CM | POA: Diagnosis not present

## 2023-05-14 DIAGNOSIS — I700 Atherosclerosis of aorta: Secondary | ICD-10-CM | POA: Diagnosis not present

## 2023-05-14 DIAGNOSIS — R0689 Other abnormalities of breathing: Secondary | ICD-10-CM | POA: Diagnosis not present

## 2023-05-14 DIAGNOSIS — I350 Nonrheumatic aortic (valve) stenosis: Secondary | ICD-10-CM | POA: Diagnosis not present

## 2023-05-14 DIAGNOSIS — I2510 Atherosclerotic heart disease of native coronary artery without angina pectoris: Secondary | ICD-10-CM | POA: Diagnosis not present

## 2023-05-14 DIAGNOSIS — E785 Hyperlipidemia, unspecified: Secondary | ICD-10-CM | POA: Diagnosis not present

## 2023-05-14 DIAGNOSIS — J449 Chronic obstructive pulmonary disease, unspecified: Secondary | ICD-10-CM | POA: Diagnosis not present

## 2023-05-14 DIAGNOSIS — R4182 Altered mental status, unspecified: Secondary | ICD-10-CM | POA: Diagnosis not present

## 2023-05-14 DIAGNOSIS — S32810D Multiple fractures of pelvis with stable disruption of pelvic ring, subsequent encounter for fracture with routine healing: Secondary | ICD-10-CM | POA: Diagnosis not present

## 2023-05-14 DIAGNOSIS — F32A Depression, unspecified: Secondary | ICD-10-CM | POA: Diagnosis present

## 2023-05-14 DIAGNOSIS — S32511D Fracture of superior rim of right pubis, subsequent encounter for fracture with routine healing: Secondary | ICD-10-CM | POA: Diagnosis not present

## 2023-05-14 DIAGNOSIS — N12 Tubulo-interstitial nephritis, not specified as acute or chronic: Secondary | ICD-10-CM | POA: Diagnosis not present

## 2023-05-14 DIAGNOSIS — E860 Dehydration: Secondary | ICD-10-CM | POA: Diagnosis present

## 2023-05-14 DIAGNOSIS — Z7401 Bed confinement status: Secondary | ICD-10-CM | POA: Diagnosis not present

## 2023-05-14 DIAGNOSIS — E7849 Other hyperlipidemia: Secondary | ICD-10-CM | POA: Diagnosis not present

## 2023-05-14 DIAGNOSIS — R531 Weakness: Secondary | ICD-10-CM | POA: Diagnosis not present

## 2023-05-14 DIAGNOSIS — K5730 Diverticulosis of large intestine without perforation or abscess without bleeding: Secondary | ICD-10-CM | POA: Diagnosis not present

## 2023-05-14 DIAGNOSIS — S72001A Fracture of unspecified part of neck of right femur, initial encounter for closed fracture: Secondary | ICD-10-CM | POA: Diagnosis not present

## 2023-05-14 DIAGNOSIS — S3289XA Fracture of other parts of pelvis, initial encounter for closed fracture: Secondary | ICD-10-CM | POA: Diagnosis not present

## 2023-05-14 DIAGNOSIS — Z8781 Personal history of (healed) traumatic fracture: Secondary | ICD-10-CM | POA: Diagnosis not present

## 2023-05-14 DIAGNOSIS — I4719 Other supraventricular tachycardia: Secondary | ICD-10-CM | POA: Diagnosis not present

## 2023-05-14 DIAGNOSIS — G9340 Encephalopathy, unspecified: Secondary | ICD-10-CM | POA: Diagnosis not present

## 2023-05-14 DIAGNOSIS — J9611 Chronic respiratory failure with hypoxia: Secondary | ICD-10-CM | POA: Diagnosis not present

## 2023-05-14 DIAGNOSIS — Z79899 Other long term (current) drug therapy: Secondary | ICD-10-CM | POA: Diagnosis not present

## 2023-05-14 DIAGNOSIS — I10 Essential (primary) hypertension: Secondary | ICD-10-CM | POA: Diagnosis not present

## 2023-05-14 MED ORDER — CLONAZEPAM 0.5 MG PO TABS: 0.5000 mg | ORAL_TABLET | Freq: Two times a day (BID) | ORAL | 0 refills | Status: DC | PRN

## 2023-05-14 MED ORDER — POLYETHYLENE GLYCOL 3350 17 G PO PACK: 17.0000 g | PACK | Freq: Every day | ORAL | Status: DC | PRN

## 2023-05-14 MED ORDER — DOCUSATE SODIUM 100 MG PO CAPS: 100.0000 mg | ORAL_CAPSULE | Freq: Two times a day (BID) | ORAL | Status: AC

## 2023-05-14 NOTE — Discharge Summary (Signed)
Physician Discharge Summary  Andrea BEAULAC WUJ:[id number] DOB: October 11, 1945 DOA: 05/07/2023  PCP: Caesar Bookman, NP  Admit date: 05/07/2023 Discharge date: 05/14/2023  Admitted From: ALF Disposition:  SNF  Recommendations for Outpatient Follow-up:  Follow up with PCP in 1-2 weeks Please obtain BMP/CBC in one week Orthopedics to schedule follow up   Discharge Condition:Stable   CODE STATUS:DNR / DNI Diet recommendation: regular diet , nutritional supplements   Discharge Summary: Patient is a 77 year old female with past medical history significant for hypertension, GAD, dementia, CKD stage IIIb, COPD, vitamin B12 deficiency  who presented from ALF after a fall resulting in pain on bilateral hip.  No history of head injury.  On presenation, patient was hemodynamically stable with mild sinus tachycardia.  Lab work showed WC count of 14.9.  CT of the pelvis showed acute nondisplaced right superior and inferior rami fracture, acute mildly impacted subcapital right femoral neck fracture.  Patient admitted and underwent right hip hemi arthroplasty.    Closed traumatic right femoral neck fracture, right superior inferior pubic rami fracture Status post right Hemi hip arthroplasty Weightbearing as tolerated DVT prophylaxis with aspirin Adequate pain management along with laxatives Conservative management for pubic rami fractures Ortho to schedule follow-up Stable to transfer to SNF.  Chronic medical issues including Essential hypertension, stable AKI on CKD stage IIIa, stable. Hyperlipidemia, on a statin COPD and chronic hypoxemic failure on 2 L oxygen at home at nights, stable.  Keep on oxygen to keep saturation more than 90%.  Bronchodilator therapies. History of dementia, fairly stable.  She is on donepezil and memantine. Generalized anxiety disorder, on bupropion, Prozac and Remeron.  Continued.   Medically stable to transition to a SNF today.   Discharge Diagnoses:   Principal Problem:   Pelvic fracture (HCC) Active Problems:   Closed fracture of multiple pubic rami, right, initial encounter (HCC)   Closed displaced fracture of right femoral neck (HCC)   Hyperlipidemia   Essential hypertension   CKD stage 3a, GFR 45-59 ml/min (HCC)   B12 deficiency   History of COPD   GAD (generalized anxiety disorder)   Fall at nursing home.    Discharge Instructions  Discharge Instructions     Call MD for:  redness, tenderness, or signs of infection (pain, swelling, redness, odor or green/yellow discharge around incision site)   Complete by: As directed    Call MD for:  severe uncontrolled pain   Complete by: As directed    Call MD for:  temperature >100.4   Complete by: As directed    Diet general   Complete by: As directed    Increase activity slowly   Complete by: As directed       Allergies as of 05/14/2023       Reactions   Lipitor [atorvastatin] Other (See Comments)   Myalgias        Medication List     STOP taking these medications    Thera-D 2000 50 MCG (2000 UT) Tabs Generic drug: Cholecalciferol   VICKS DAYQUIL COLD & FLU PO       TAKE these medications    acetaminophen 325 MG tablet Commonly known as: TYLENOL Take 650 mg by mouth every 6 (six) hours as needed for mild pain or headache.   amLODipine 5 MG tablet Commonly known as: NORVASC TAKE ONE TABLET BY MOUTH DAILY   aspirin 81 MG chewable tablet Commonly known as: Aspirin Childrens Chew 1 tablet (81 mg total) by mouth 2 (two) times  daily with a meal.   buPROPion 150 MG 24 hr tablet Commonly known as: WELLBUTRIN XL TAKE ONE TABLET BY MOUTH DAILY   clonazePAM 0.5 MG tablet Commonly known as: KLONOPIN Take 1 tablet (0.5 mg total) by mouth 2 (two) times daily as needed for up to 7 days for anxiety. What changed: Another medication with the same name was removed. Continue taking this medication, and follow the directions you see here.   cyanocobalamin 1000  MCG tablet Commonly known as: VITAMIN B12 Take 1 tablet (1,000 mcg total) by mouth daily.   docusate sodium 100 MG capsule Commonly known as: COLACE Take 1 capsule (100 mg total) by mouth 2 (two) times daily.   donepezil 5 MG tablet Commonly known as: ARICEPT TAKE ONE TABLET BY MOUTH AT BEDTIME   Ensure Active High Protein Liqd Take 1 Can by mouth daily.   FLUoxetine 40 MG capsule Commonly known as: PROZAC TAKE ONE CAPSULE BY MOUTH DAILY   fluticasone 50 MCG/ACT nasal spray Commonly known as: FLONASE USE TWO SPRAYS in each nostril ONCE DAILY.   Flutter Devi Use as directed   folic acid 1 MG tablet Commonly known as: FOLVITE TAKE ONE TABLET BY MOUTH DAILY   HYDROcodone-acetaminophen 5-325 MG tablet Commonly known as: NORCO/VICODIN Take 1 tablet by mouth every 4 (four) hours as needed for up to 7 days for moderate pain (pain score 4-6) or severe pain (pain score 7-10).   ipratropium 0.02 % nebulizer solution Commonly known as: ATROVENT NEBULIZE CONTENTS OF 1 VIAL 3 TIMES A DAY   levalbuterol 0.63 MG/3ML nebulizer solution Commonly known as: XOPENEX USE ONE vial via nebulization every SIX hours as needed FOR SHORTNESS OF BREATH OR wheezing.   melatonin 3 MG Tabs tablet Take 3 mg by mouth at bedtime.   memantine 10 MG tablet Commonly known as: NAMENDA TAKE ONE TABLET BY MOUTH TWICE DAILY   mirtazapine 15 MG tablet Commonly known as: REMERON TAKE ONE TABLET BY MOUTH AT BEDTIME   montelukast 10 MG tablet Commonly known as: SINGULAIR TAKE ONE TABLET BY MOUTH DAILY   multivitamin with minerals Tabs tablet Take 1 tablet by mouth daily.   NYQUIL COUGH DM + CONGESTION PO Take 30 mLs by mouth at bedtime as needed (for cold and flu symptoms).   omeprazole 20 MG capsule Commonly known as: PRILOSEC Take 2 capsules (40 mg total) by mouth daily as needed. What changed: reasons to take this   polyethylene glycol 17 g packet Commonly known as: MIRALAX /  GLYCOLAX Take 17 g by mouth daily as needed for mild constipation.   rosuvastatin 20 MG tablet Commonly known as: CRESTOR TAKE ONE TABLET BY MOUTH DAILY   thiamine 100 MG tablet Commonly known as: VITAMIN B1 Take 1 tablet (100 mg total) by mouth daily.   Trelegy Ellipta 200-62.5-25 MCG/ACT Aepb Generic drug: Fluticasone-Umeclidin-Vilant INHALE 1 PUFF INTO THE LUNGS ONCE DAILY AS DIRECTED.        Contact information for follow-up providers     Alta, Alain Honey, PA-C. Schedule an appointment as soon as possible for a visit in 2 week(s).   Specialty: Orthopedic Surgery Why: For suture removal, For wound re-check Contact information: [address] [place] [address] [phone]              Contact information for after-discharge care     Destination     [facility]erred [facility] .   Service: Skilled Nursing Contact information: [address] [facility]  Address [place] [address] [phone]                    Allergies  Allergen Reactions   Lipitor [Atorvastatin] Other (See Comments)    Myalgias    Consultations: Orthopedics   Procedures/Studies: DG Pelvis Portable  Result Date: 05/09/2023 CLINICAL DATA:  Right hip replacement.  [id number].  Postoperative. EXAM: PORTABLE PELVIS 1-2 VIEWS COMPARISON:  CT of the pelvis 05/07/2023 FINDINGS: Skin staples overlie the right hip region. The right femoral head and neck have been resected. A total right hip replacement has been inserted. No acute hardware complication is seen. Hardware alignment appears physiologic on this single view. Osteopenia. Recent fractures of the right superior and inferior pubic rami are again shown with mild displacement. No new fracture has become apparent. The proximal left femur is unremarkable. There is spurring of the SI joints and pubic symphysis. Patchy calcific plaques both superficial femoral arteries. Patchy soft tissue gas extends over  the operative bed on the right. IMPRESSION: 1. Status post total right hip replacement without evidence of acute hardware complication. 2. Recent fractures of the right superior and inferior pubic rami are again shown with mild displacement. 3. Osteopenia and degenerative change. 4. Atherosclerosis. Electronically Signed   By: Almira Bar M.D.   On: 05/09/2023 22:36   DG HIP UNILAT WITH PELVIS 1V RIGHT  Result Date: 05/09/2023 CLINICAL DATA:  Right hip arthroplasty. EXAM: DG HIP (WITH OR WITHOUT PELVIS) 1V RIGHT COMPARISON:  Pelvic CT dated 05/07/2023. FINDINGS: Six intraoperative fluoroscopic spot images of the right hip provided. The cumulative air Karma is 1.01 mGy. There has been interval placement of a right hip arthroplasty. IMPRESSION: Intraoperative fluoroscopic spot images of the right hip. Electronically Signed   By: Elgie Collard M.D.   On: 05/09/2023 21:41   DG C-Arm 1-60 Min-No Report  Result Date: 05/09/2023 Fluoroscopy was utilized by the requesting physician.  No radiographic interpretation.   DG CHEST PORT 1 VIEW  Result Date: 05/09/2023 CLINICAL DATA:  [address] Wheezing [id number] EXAM: PORTABLE CHEST 1 VIEW COMPARISON:  CXR 01/08/23 FINDINGS: No pleural effusion. No pneumothorax. Likely unchanged cardiac and mediastinal contours when accounting for differences in lung volumes. Compared to prior exam there are increased bilateral perihilar and interstitial opacities, which can be seen in the setting of atypical infection or bronchitis. Radiographically apparent displaced rib fractures. Visualized upper abdomen unremarkable. IMPRESSION: Increased bilateral perihilar and interstitial opacities, which can be seen in the setting of atypical infection or bronchitis. There is clinically indicated, this could be further assessed with a chest CT. Electronically Signed   By: Lorenza Cambridge M.D.   On: 05/09/2023 13:14   CT PELVIS WO CONTRAST  Result Date: 05/08/2023 CLINICAL DATA:  Hip trauma  fall EXAM: CT PELVIS WITHOUT CONTRAST TECHNIQUE: Multidetector CT imaging of the pelvis was performed following the standard protocol without intravenous contrast. RADIATION DOSE REDUCTION: This exam was performed according to the departmental dose-optimization program which includes automated exposure control, adjustment of the mA and/or kV according to patient size and/or use of iterative reconstruction technique. COMPARISON:  05/07/2023 FINDINGS: Urinary Tract:  Urinary bladder is unremarkable Bowel: No acute bowel wall thickening. Diverticular disease of the sigmoid colon. Vascular/Lymphatic: Aortic atherosclerosis. No aneurysm. No suspicious lymph nodes Reproductive:  Uterus unremarkable.  No adnexal mass. Other:  Negative for pelvic effusion or free air. Musculoskeletal: Acute nondisplaced right superior and inferior pubic rami fractures. Acute mildly impacted subcapital right femoral neck fracture. No femoral head  dislocation. Pubic symphysis appears intact. Mild asymmetric enlargement of the right operator muscles consistent with soft tissue injury. IMPRESSION: 1. Acute nondisplaced right superior and inferior pubic rami fractures. 2. Acute mildly impacted subcapital right femoral neck fracture. 3. Aortic atherosclerosis. Aortic Atherosclerosis (ICD10-I70.0). Electronically Signed   By: Jasmine Pang M.D.   On: 05/08/2023 00:27   CT Lumbar Spine Wo Contrast  Result Date: 05/07/2023 CLINICAL DATA:  Trauma EXAM: CT LUMBAR SPINE WITHOUT CONTRAST TECHNIQUE: Multidetector CT imaging of the lumbar spine was performed without intravenous contrast administration. Multiplanar CT image reconstructions were also generated. RADIATION DOSE REDUCTION: This exam was performed according to the departmental dose-optimization program which includes automated exposure control, adjustment of the mA and/or kV according to patient size and/or use of iterative reconstruction technique. COMPARISON:  None Available. FINDINGS:  Segmentation: 5 lumbar type vertebrae. Alignment: Grade 1 retrolisthesis at L1-2 and L2-3. Vertebrae: Wedge compression fracture of T10 with 50% height loss. This is chronic. No acute fracture. Paraspinal and other soft tissues: Calcific aortic atherosclerosis. Disc levels: No bony spinal canal stenosis.  No neural impingement. IMPRESSION: 1. No acute fracture or static subluxation of the lumbar spine. 2. Chronic wedge compression fracture of T10 with 50% height loss. Aortic Atherosclerosis (ICD10-I70.0). Electronically Signed   By: Deatra Robinson M.D.   On: 05/07/2023 23:53   DG Pelvis 1-2 Views  Result Date: 05/07/2023 CLINICAL DATA:  Pain EXAM: PELVIS - 1-2 VIEW COMPARISON:  None Available. FINDINGS: The bones are osteopenic. Right hip is slightly rotated which limits evaluation. There are findings suspicious for nondisplaced fracture of the right superior pubic ramus. There is no dislocation. Soft tissues are within normal limits. IMPRESSION: Findings suspicious for nondisplaced fracture of the right superior pubic ramus. Limited evaluation of the right hip secondary to rotation. Recommend dedicated hip x-ray for further evaluation if clinically warranted. Electronically Signed   By: Darliss Cheney M.D.   On: 05/07/2023 22:30   (Echo, Carotid, EGD, Colonoscopy, ERCP)    Subjective: Patient seen and examined.  She asked me to help her sit up in the bed to eat her breakfast.  She tells me her pain is mild and moderate and she cannot take it.  No other overnight events.   Discharge Exam: Vitals:   05/14/23 0535 05/14/23 0839  BP: 122/65   Pulse: (!) 103   Resp: 16   Temp: 98.5 F (36.9 C)   SpO2: 100% 99%   Vitals:   05/13/23 2107 05/14/23 0500 05/14/23 0535 05/14/23 0839  BP: 124/74  122/65   Pulse: (!) 109  (!) 103   Resp: 17  16   Temp: 99 F (37.2 C)  98.5 F (36.9 C)   TempSrc: Oral  Oral   SpO2:   100% 99%  Weight:  56.6 kg    Height:        General: Pt is alert, awake, not  in acute distress Pleasant to conversation. Cardiovascular: RRR, S1/S2 +, no rubs, no gallops Respiratory: CTA bilaterally, no wheezing, no rhonchi Abdominal: Soft, NT, ND, bowel sounds + Extremities: no edema, no cyanosis Right anterior thigh incision clean and dry.  Minimal swelling surrounding area.  Distal neurovascular status intact.    The results of significant diagnostics from this hospitalization (including imaging, microbiology, ancillary and laboratory) are listed below for reference.     Microbiology: No results found for this or any previous visit (from the past 240 hour(s)).   Labs: BNP (last 3 results) No results for input(s): "  BNP" in the last 8760 hours. Basic Metabolic Panel: Recent Labs  Lab 05/07/23 2058 05/09/23 0820 05/10/23 0332  NA 137 130* 129*  K 4.7 4.9 4.8  CL 99 97* 95*  CO2 30 24 26   GLUCOSE 94 136* 152*  BUN 17 44* 37*  CREATININE 1.10* 2.23* 1.53*  CALCIUM 9.4 8.1* 7.9*   Liver Function Tests: Recent Labs  Lab 05/07/23 2058 05/09/23 0820  AST 30 19  ALT 25 19  ALKPHOS 69 54  BILITOT 0.7 0.3  PROT 6.9 6.0*  ALBUMIN 3.3* 2.9*   No results for input(s): "LIPASE", "AMYLASE" in the last 168 hours. No results for input(s): "AMMONIA" in the last 168 hours. CBC: Recent Labs  Lab 05/07/23 2058 05/09/23 0820 05/10/23 0332  WBC 14.9* 11.8* 9.9  NEUTROABS 12.4*  --   --   HGB 13.4 10.9* 9.3*  HCT 41.9 35.4* 28.6*  MCV 91.7 92.9 93.5  PLT 274 205 157   Cardiac Enzymes: No results for input(s): "CKTOTAL", "CKMB", "CKMBINDEX", "TROPONINI" in the last 168 hours. BNP: Invalid input(s): "POCBNP" CBG: No results for input(s): "GLUCAP" in the last 168 hours. D-Dimer No results for input(s): "DDIMER" in the last 72 hours. Hgb A1c No results for input(s): "HGBA1C" in the last 72 hours. Lipid Profile No results for input(s): "CHOL", "HDL", "LDLCALC", "TRIG", "CHOLHDL", "LDLDIRECT" in the last 72 hours. Thyroid function studies No  results for input(s): "TSH", "T4TOTAL", "T3FREE", "THYROIDAB" in the last 72 hours.  Invalid input(s): "FREET3" Anemia work up No results for input(s): "VITAMINB12", "FOLATE", "FERRITIN", "TIBC", "IRON", "RETICCTPCT" in the last 72 hours. Urinalysis    Component Value Date/Time   COLORURINE YELLOW 05/07/2023 2311   APPEARANCEUR CLEAR 05/07/2023 2311   LABSPEC 1.014 05/07/2023 2311   PHURINE 6.0 05/07/2023 2311   GLUCOSEU NEGATIVE 05/07/2023 2311   GLUCOSEU 100 (A) 03/20/2021 1556   HGBUR NEGATIVE 05/07/2023 2311   HGBUR negative 06/20/2010 1406   BILIRUBINUR NEGATIVE 05/07/2023 2311   BILIRUBINUR Negative 04/03/2021 1536   KETONESUR NEGATIVE 05/07/2023 2311   PROTEINUR NEGATIVE 05/07/2023 2311   UROBILINOGEN negative (A) 04/03/2021 1536   UROBILINOGEN 0.2 03/20/2021 1556   NITRITE NEGATIVE 05/07/2023 2311   LEUKOCYTESUR NEGATIVE 05/07/2023 2311   Sepsis Labs Recent Labs  Lab 05/07/23 2058 05/09/23 0820 05/10/23 0332  WBC 14.9* 11.8* 9.9   Microbiology No results found for this or any previous visit (from the past 240 hour(s)).   Time coordinating discharge: 35 minutes  SIGNED:   Dorcas Carrow, MD  [facility] 05/14/2023, 11:05 AM

## 2023-05-14 NOTE — TOC Transition Note (Signed)
Transition of Care [facility]) - CM/SW Discharge Note   Patient Details  Name: Andrea Larson MRN: [id number] Date of Birth: [birth year]  Transition of [facility] CM/SW Contact:  Amada Jupiter, LCSW Phone Number: 05/14/2023, 1:08 PM   Clinical Narrative:     Pt medically cleared for dc to SNF today and bed accepted/ ready at [facility].  Ins auth received.  Pt and son aware and agreeable with dc today.  PTAR called at 1pm.  RN to call report to [phone].  No further TOC needs.  Final next level of care: Skilled Nursing Facility Barriers to Discharge: Barriers Resolved   Patient Goals and CMS Choice      Discharge Placement     Existing PASRR number confirmed : 05/10/23          Patient chooses bed at: [facility] Patient to be transferred to facility by: PTAR Name of family member notified: son, Chrissie Noa Patient and family notified of of transfer: 05/14/23  Discharge Plan and Services Additional resources added to the After Visit Summary for   In-house Referral: Clinical Social Work   Post Acute Care Choice: Skilled Nursing Facility          DME Arranged: N/A DME Agency: NA                  Social Determinants of Health (SDOH) Interventions SDOH Screenings   Food Insecurity: No Food Insecurity (05/08/2023)  Housing: Low Risk  (05/08/2023)  Transportation Needs: No Transportation Needs (05/08/2023)  Utilities: Not At Risk (05/08/2023)  Depression (PHQ2-9): Low Risk  (02/11/2023)  Tobacco Use: Medium Risk (05/09/2023)     Readmission Risk Interventions    05/10/2023    2:54 PM 04/11/2021    1:22 PM  Readmission Risk Prevention Plan  Transportation Screening Complete Complete  PCP or Specialist Appt within 5-7 Days Complete   PCP or Specialist Appt within 3-5 Days  Complete  Home Care Screening Complete   Medication Review (RN CM) Complete   HRI or Home Care Consult  Complete  Social Work Consult for Recovery Care Planning/Counseling  Complete  Palliative  Care Screening  Complete  Medication Review Oceanographer)  Complete

## 2023-05-14 NOTE — Progress Notes (Signed)
Patient discharged to [facility] via [facility]. All belongings w/ patient. Report called.

## 2023-05-14 NOTE — Care Management Important Message (Signed)
Important Message  Patient Details IM Letter given. Name: AARTHI UYENO MRN: [id number] Date of Birth: August 03, 1945   Important Message Given:  Yes - Medicare IM     Caren Macadam 05/14/2023, 10:40 AM

## 2023-05-14 NOTE — Progress Notes (Signed)
    Subjective:  Patient reports pain as mild.  Denies N/V/CP/SOB/Abd pain. She denies any tingling or numbness in LE bilaterally.    Objective:   VITALS:   Vitals:   05/13/23 1334 05/13/23 2107 05/14/23 0500 05/14/23 0535  BP: 130/70 124/74  122/65  Pulse: (!) 110 (!) 109  (!) 103  Resp: 16 17  16   Temp: (!) 97.4 F (36.3 C) 99 F (37.2 C)  98.5 F (36.9 C)  TempSrc:  Oral  Oral  SpO2: 100%   100%  Weight:   56.6 kg   Height:       A&O x3.  Patient sitting up in bed. NAD.  Neurologically intact ABD soft Neurovascular intact Sensation intact distally Intact pulses distally Dorsiflexion/Plantar flexion intact Incision: dressing C/D/I No cellulitis present Compartment soft Bruising on anterior thigh.   Lab Results  Component Value Date   WBC 9.9 05/10/2023   HGB 9.3 (L) 05/10/2023   HCT 28.6 (L) 05/10/2023   MCV 93.5 05/10/2023   PLT 157 05/10/2023   BMET    Component Value Date/Time   NA 129 (L) 05/10/2023 0332   K 4.8 05/10/2023 0332   CL 95 (L) 05/10/2023 0332   CO2 26 05/10/2023 0332   GLUCOSE 152 (H) 05/10/2023 0332   BUN 37 (H) 05/10/2023 0332   CREATININE 1.53 (H) 05/10/2023 0332   CREATININE 1.30 (H) 05/01/2023 1532   CALCIUM 7.9 (L) 05/10/2023 0332   EGFR 41 (L) 02/11/2023 1626   GFRNONAA 34 (L) 05/10/2023 0332   GFRNONAA 29 (L) 04/05/2020 1628     Assessment/Plan: 5 Days Post-Op   Principal Problem:   Pelvic fracture (HCC) Active Problems:   Hyperlipidemia   Essential hypertension   CKD stage 3a, GFR 45-59 ml/min (HCC)   B12 deficiency   Closed fracture of multiple pubic rami, right, initial encounter (HCC)   Closed displaced fracture of right femoral neck (HCC)   History of COPD   GAD (generalized anxiety disorder)   Fall at nursing home.   WBAT with walker to tolerance.  DVT ppx: Aspirin, SCDs, TEDS PO pain control PT/OT: PT has not seen yet. PT to come by today.  Dispo:  - Patient under care of the medical team,  disposition per their recommendation. Aspirin 81mg  BID for DVT ppx x6 weeks. Pain medications to be printed in chart.    Clois Dupes 05/14/2023, 8:08 AM   [facility]  [place] [address], [place], [place] [address] Phone: [phone] [email or web address] Facebook  [employer]

## 2023-05-14 NOTE — Plan of Care (Signed)
  Problem: Coping: Goal: Level of anxiety will decrease Outcome: Progressing   Problem: Pain Management: Goal: General experience of comfort will improve Outcome: Progressing   Problem: Safety: Goal: Ability to remain free from injury will improve Outcome: Progressing

## 2023-05-16 DIAGNOSIS — I10 Essential (primary) hypertension: Secondary | ICD-10-CM | POA: Diagnosis not present

## 2023-05-16 DIAGNOSIS — S32810D Multiple fractures of pelvis with stable disruption of pelvic ring, subsequent encounter for fracture with routine healing: Secondary | ICD-10-CM | POA: Diagnosis not present

## 2023-05-16 DIAGNOSIS — E559 Vitamin D deficiency, unspecified: Secondary | ICD-10-CM | POA: Diagnosis not present

## 2023-05-16 DIAGNOSIS — E785 Hyperlipidemia, unspecified: Secondary | ICD-10-CM | POA: Diagnosis not present

## 2023-05-16 DIAGNOSIS — F03918 Unspecified dementia, unspecified severity, with other behavioral disturbance: Secondary | ICD-10-CM | POA: Diagnosis not present

## 2023-05-23 DIAGNOSIS — J449 Chronic obstructive pulmonary disease, unspecified: Secondary | ICD-10-CM | POA: Diagnosis not present

## 2023-05-23 DIAGNOSIS — E785 Hyperlipidemia, unspecified: Secondary | ICD-10-CM | POA: Diagnosis not present

## 2023-05-23 DIAGNOSIS — I10 Essential (primary) hypertension: Secondary | ICD-10-CM | POA: Diagnosis not present

## 2023-05-23 DIAGNOSIS — E559 Vitamin D deficiency, unspecified: Secondary | ICD-10-CM | POA: Diagnosis not present

## 2023-05-27 DIAGNOSIS — E559 Vitamin D deficiency, unspecified: Secondary | ICD-10-CM | POA: Diagnosis not present

## 2023-05-27 DIAGNOSIS — E785 Hyperlipidemia, unspecified: Secondary | ICD-10-CM | POA: Diagnosis not present

## 2023-05-27 DIAGNOSIS — J449 Chronic obstructive pulmonary disease, unspecified: Secondary | ICD-10-CM | POA: Diagnosis not present

## 2023-05-27 DIAGNOSIS — I10 Essential (primary) hypertension: Secondary | ICD-10-CM | POA: Diagnosis not present

## 2023-05-29 ENCOUNTER — Encounter (HOSPITAL_COMMUNITY): Payer: Self-pay | Admitting: Internal Medicine

## 2023-05-29 ENCOUNTER — Emergency Department (HOSPITAL_COMMUNITY): Payer: Medicare HMO

## 2023-05-29 ENCOUNTER — Other Ambulatory Visit: Payer: Self-pay

## 2023-05-29 ENCOUNTER — Inpatient Hospital Stay (HOSPITAL_COMMUNITY)
Admission: EM | Admit: 2023-05-29 | Discharge: 2023-06-03 | DRG: 689 | Disposition: A | Discharge status: Skilled Nursing Facility | Payer: Medicare HMO | Source: Skilled Nursing Facility | Attending: Family Medicine | Admitting: Family Medicine

## 2023-05-29 DIAGNOSIS — R0689 Other abnormalities of breathing: Secondary | ICD-10-CM | POA: Diagnosis not present

## 2023-05-29 DIAGNOSIS — R531 Weakness: Secondary | ICD-10-CM | POA: Diagnosis not present

## 2023-05-29 DIAGNOSIS — Z8249 Family history of ischemic heart disease and other diseases of the circulatory system: Secondary | ICD-10-CM | POA: Diagnosis not present

## 2023-05-29 DIAGNOSIS — Z66 Do not resuscitate: Secondary | ICD-10-CM | POA: Diagnosis present

## 2023-05-29 DIAGNOSIS — N12 Tubulo-interstitial nephritis, not specified as acute or chronic: Principal | ICD-10-CM | POA: Diagnosis present

## 2023-05-29 DIAGNOSIS — W19XXXD Unspecified fall, subsequent encounter: Secondary | ICD-10-CM | POA: Diagnosis present

## 2023-05-29 DIAGNOSIS — Z7401 Bed confinement status: Secondary | ICD-10-CM | POA: Diagnosis not present

## 2023-05-29 DIAGNOSIS — B957 Other staphylococcus as the cause of diseases classified elsewhere: Secondary | ICD-10-CM | POA: Diagnosis present

## 2023-05-29 DIAGNOSIS — I441 Atrioventricular block, second degree: Secondary | ICD-10-CM | POA: Diagnosis present

## 2023-05-29 DIAGNOSIS — J410 Simple chronic bronchitis: Secondary | ICD-10-CM | POA: Diagnosis not present

## 2023-05-29 DIAGNOSIS — S72001D Fracture of unspecified part of neck of right femur, subsequent encounter for closed fracture with routine healing: Secondary | ICD-10-CM

## 2023-05-29 DIAGNOSIS — E785 Hyperlipidemia, unspecified: Secondary | ICD-10-CM | POA: Diagnosis present

## 2023-05-29 DIAGNOSIS — S32511D Fracture of superior rim of right pubis, subsequent encounter for fracture with routine healing: Secondary | ICD-10-CM

## 2023-05-29 DIAGNOSIS — F0394 Unspecified dementia, unspecified severity, with anxiety: Secondary | ICD-10-CM | POA: Diagnosis not present

## 2023-05-29 DIAGNOSIS — F0390 Unspecified dementia without behavioral disturbance: Secondary | ICD-10-CM | POA: Diagnosis present

## 2023-05-29 DIAGNOSIS — J9611 Chronic respiratory failure with hypoxia: Secondary | ICD-10-CM | POA: Diagnosis not present

## 2023-05-29 DIAGNOSIS — I10 Essential (primary) hypertension: Secondary | ICD-10-CM | POA: Diagnosis not present

## 2023-05-29 DIAGNOSIS — N1831 Chronic kidney disease, stage 3a: Secondary | ICD-10-CM | POA: Diagnosis not present

## 2023-05-29 DIAGNOSIS — Z8781 Personal history of (healed) traumatic fracture: Secondary | ICD-10-CM | POA: Diagnosis not present

## 2023-05-29 DIAGNOSIS — I352 Nonrheumatic aortic (valve) stenosis with insufficiency: Secondary | ICD-10-CM | POA: Diagnosis present

## 2023-05-29 DIAGNOSIS — R278 Other lack of coordination: Secondary | ICD-10-CM | POA: Diagnosis not present

## 2023-05-29 DIAGNOSIS — N183 Chronic kidney disease, stage 3 unspecified: Secondary | ICD-10-CM | POA: Diagnosis not present

## 2023-05-29 DIAGNOSIS — F411 Generalized anxiety disorder: Secondary | ICD-10-CM | POA: Diagnosis not present

## 2023-05-29 DIAGNOSIS — I959 Hypotension, unspecified: Secondary | ICD-10-CM | POA: Diagnosis not present

## 2023-05-29 DIAGNOSIS — N10 Acute tubulo-interstitial nephritis: Secondary | ICD-10-CM | POA: Diagnosis not present

## 2023-05-29 DIAGNOSIS — K429 Umbilical hernia without obstruction or gangrene: Secondary | ICD-10-CM | POA: Diagnosis present

## 2023-05-29 DIAGNOSIS — I2510 Atherosclerotic heart disease of native coronary artery without angina pectoris: Secondary | ICD-10-CM | POA: Diagnosis not present

## 2023-05-29 DIAGNOSIS — Z860100 Personal history of colon polyps, unspecified: Secondary | ICD-10-CM

## 2023-05-29 DIAGNOSIS — R61 Generalized hyperhidrosis: Secondary | ICD-10-CM | POA: Diagnosis not present

## 2023-05-29 DIAGNOSIS — G9341 Metabolic encephalopathy: Principal | ICD-10-CM | POA: Diagnosis present

## 2023-05-29 DIAGNOSIS — R4182 Altered mental status, unspecified: Secondary | ICD-10-CM | POA: Diagnosis not present

## 2023-05-29 DIAGNOSIS — I350 Nonrheumatic aortic (valve) stenosis: Secondary | ICD-10-CM

## 2023-05-29 DIAGNOSIS — E860 Dehydration: Secondary | ICD-10-CM | POA: Diagnosis present

## 2023-05-29 DIAGNOSIS — K5909 Other constipation: Secondary | ICD-10-CM | POA: Diagnosis present

## 2023-05-29 DIAGNOSIS — Z96641 Presence of right artificial hip joint: Secondary | ICD-10-CM | POA: Diagnosis not present

## 2023-05-29 DIAGNOSIS — R457 State of emotional shock and stress, unspecified: Secondary | ICD-10-CM | POA: Diagnosis not present

## 2023-05-29 DIAGNOSIS — F32A Depression, unspecified: Secondary | ICD-10-CM | POA: Diagnosis present

## 2023-05-29 DIAGNOSIS — I700 Atherosclerosis of aorta: Secondary | ICD-10-CM | POA: Diagnosis not present

## 2023-05-29 DIAGNOSIS — Z79890 Hormone replacement therapy (postmenopausal): Secondary | ICD-10-CM

## 2023-05-29 DIAGNOSIS — R000 Tachycardia, unspecified: Secondary | ICD-10-CM | POA: Diagnosis not present

## 2023-05-29 DIAGNOSIS — E162 Hypoglycemia, unspecified: Secondary | ICD-10-CM | POA: Diagnosis not present

## 2023-05-29 DIAGNOSIS — Z87891 Personal history of nicotine dependence: Secondary | ICD-10-CM

## 2023-05-29 DIAGNOSIS — J449 Chronic obstructive pulmonary disease, unspecified: Secondary | ICD-10-CM | POA: Diagnosis present

## 2023-05-29 DIAGNOSIS — M6281 Muscle weakness (generalized): Secondary | ICD-10-CM | POA: Diagnosis not present

## 2023-05-29 DIAGNOSIS — Z9981 Dependence on supplemental oxygen: Secondary | ICD-10-CM

## 2023-05-29 DIAGNOSIS — K5730 Diverticulosis of large intestine without perforation or abscess without bleeding: Secondary | ICD-10-CM | POA: Diagnosis not present

## 2023-05-29 DIAGNOSIS — I4719 Other supraventricular tachycardia: Secondary | ICD-10-CM | POA: Diagnosis present

## 2023-05-29 DIAGNOSIS — E7849 Other hyperlipidemia: Secondary | ICD-10-CM

## 2023-05-29 DIAGNOSIS — Z79899 Other long term (current) drug therapy: Secondary | ICD-10-CM | POA: Diagnosis not present

## 2023-05-29 DIAGNOSIS — K439 Ventral hernia without obstruction or gangrene: Secondary | ICD-10-CM | POA: Diagnosis present

## 2023-05-29 DIAGNOSIS — G9340 Encephalopathy, unspecified: Secondary | ICD-10-CM | POA: Diagnosis not present

## 2023-05-29 DIAGNOSIS — G8929 Other chronic pain: Secondary | ICD-10-CM | POA: Diagnosis present

## 2023-05-29 DIAGNOSIS — K8020 Calculus of gallbladder without cholecystitis without obstruction: Secondary | ICD-10-CM | POA: Diagnosis not present

## 2023-05-29 DIAGNOSIS — R0682 Tachypnea, not elsewhere classified: Secondary | ICD-10-CM | POA: Diagnosis not present

## 2023-05-29 DIAGNOSIS — I4892 Unspecified atrial flutter: Secondary | ICD-10-CM | POA: Diagnosis not present

## 2023-05-29 DIAGNOSIS — B952 Enterococcus as the cause of diseases classified elsewhere: Secondary | ICD-10-CM | POA: Diagnosis present

## 2023-05-29 DIAGNOSIS — J4489 Other specified chronic obstructive pulmonary disease: Secondary | ICD-10-CM | POA: Diagnosis present

## 2023-05-29 DIAGNOSIS — Z888 Allergy status to other drugs, medicaments and biological substances status: Secondary | ICD-10-CM

## 2023-05-29 DIAGNOSIS — Z1152 Encounter for screening for COVID-19: Secondary | ICD-10-CM | POA: Diagnosis not present

## 2023-05-29 DIAGNOSIS — R748 Abnormal levels of other serum enzymes: Secondary | ICD-10-CM | POA: Diagnosis present

## 2023-05-29 DIAGNOSIS — R410 Disorientation, unspecified: Secondary | ICD-10-CM | POA: Diagnosis not present

## 2023-05-29 DIAGNOSIS — S72041D Displaced fracture of base of neck of right femur, subsequent encounter for closed fracture with routine healing: Secondary | ICD-10-CM | POA: Diagnosis not present

## 2023-05-29 DIAGNOSIS — I499 Cardiac arrhythmia, unspecified: Secondary | ICD-10-CM | POA: Diagnosis not present

## 2023-05-29 DIAGNOSIS — Z823 Family history of stroke: Secondary | ICD-10-CM

## 2023-05-29 DIAGNOSIS — I129 Hypertensive chronic kidney disease with stage 1 through stage 4 chronic kidney disease, or unspecified chronic kidney disease: Secondary | ICD-10-CM | POA: Diagnosis present

## 2023-05-29 DIAGNOSIS — E876 Hypokalemia: Secondary | ICD-10-CM | POA: Diagnosis present

## 2023-05-29 DIAGNOSIS — R2689 Other abnormalities of gait and mobility: Secondary | ICD-10-CM | POA: Diagnosis not present

## 2023-05-29 LAB — CBC WITH DIFFERENTIAL/PLATELET
Abs Immature Granulocytes: 0.03 10*3/uL (ref 0.00–0.07)
Basophils Absolute: 0.1 10*3/uL (ref 0.0–0.1)
Basophils Relative: 1 %
Eosinophils Absolute: 0 10*3/uL (ref 0.0–0.5)
Eosinophils Relative: 1 %
HCT: 31.3 % — ABNORMAL LOW (ref 36.0–46.0)
Hemoglobin: 9.7 g/dL — ABNORMAL LOW (ref 12.0–15.0)
Immature Granulocytes: 0 %
Lymphocytes Relative: 9 %
Lymphs Abs: 0.8 10*3/uL (ref 0.7–4.0)
MCH: 29.9 pg (ref 26.0–34.0)
MCHC: 31 g/dL (ref 30.0–36.0)
MCV: 96.6 fL (ref 80.0–100.0)
Monocytes Absolute: 1 10*3/uL (ref 0.1–1.0)
Monocytes Relative: 11 %
Neutro Abs: 7 10*3/uL (ref 1.7–7.7)
Neutrophils Relative %: 78 %
Platelets: 449 10*3/uL — ABNORMAL HIGH (ref 150–400)
RBC: 3.24 MIL/uL — ABNORMAL LOW (ref 3.87–5.11)
RDW: 14.9 % (ref 11.5–15.5)
WBC: 8.8 10*3/uL (ref 4.0–10.5)
nRBC: 0 % (ref 0.0–0.2)

## 2023-05-29 LAB — URINALYSIS, W/ REFLEX TO CULTURE (INFECTION SUSPECTED)
Bacteria, UA: NONE SEEN
Bilirubin Urine: NEGATIVE
Glucose, UA: NEGATIVE mg/dL
Hgb urine dipstick: NEGATIVE
Ketones, ur: 5 mg/dL — AB
Leukocytes,Ua: NEGATIVE
Nitrite: NEGATIVE
Protein, ur: NEGATIVE mg/dL
Specific Gravity, Urine: 1.031 — ABNORMAL HIGH (ref 1.005–1.030)
pH: 7 (ref 5.0–8.0)

## 2023-05-29 LAB — COMPREHENSIVE METABOLIC PANEL
ALT: 14 U/L (ref 0–44)
AST: 21 U/L (ref 15–41)
Albumin: 3.1 g/dL — ABNORMAL LOW (ref 3.5–5.0)
Alkaline Phosphatase: 177 U/L — ABNORMAL HIGH (ref 38–126)
Anion gap: 9 (ref 5–15)
BUN: 20 mg/dL (ref 8–23)
CO2: 28 mmol/L (ref 22–32)
Calcium: 8.4 mg/dL — ABNORMAL LOW (ref 8.9–10.3)
Chloride: 99 mmol/L (ref 98–111)
Creatinine, Ser: 1.43 mg/dL — ABNORMAL HIGH (ref 0.44–1.00)
GFR, Estimated: 37 mL/min — ABNORMAL LOW (ref >60)
Glucose, Bld: 101 mg/dL — ABNORMAL HIGH (ref 70–99)
Potassium: 4.6 mmol/L (ref 3.5–5.1)
Sodium: 136 mmol/L (ref 135–145)
Total Bilirubin: 0.4 mg/dL (ref <1.2)
Total Protein: 6.7 g/dL (ref 6.5–8.1)

## 2023-05-29 LAB — I-STAT CHEM 8, ED
BUN: 19 mg/dL (ref 8–23)
Calcium, Ion: 1.11 mmol/L — ABNORMAL LOW (ref 1.15–1.40)
Chloride: 100 mmol/L (ref 98–111)
Creatinine, Ser: 1.5 mg/dL — ABNORMAL HIGH (ref 0.44–1.00)
Glucose, Bld: 100 mg/dL — ABNORMAL HIGH (ref 70–99)
HCT: 31 % — ABNORMAL LOW (ref 36.0–46.0)
Hemoglobin: 10.5 g/dL — ABNORMAL LOW (ref 12.0–15.0)
Potassium: 4.7 mmol/L (ref 3.5–5.1)
Sodium: 138 mmol/L (ref 135–145)
TCO2: 27 mmol/L (ref 22–32)

## 2023-05-29 LAB — PROTIME-INR
INR: 1 (ref 0.8–1.2)
Prothrombin Time: 13.2 s (ref 11.4–15.2)

## 2023-05-29 LAB — RESP PANEL BY RT-PCR (RSV, FLU A&B, COVID)  RVPGX2
Influenza A by PCR: NEGATIVE
Influenza B by PCR: NEGATIVE
Resp Syncytial Virus by PCR: NEGATIVE
SARS Coronavirus 2 by RT PCR: NEGATIVE

## 2023-05-29 LAB — APTT: aPTT: 30 s (ref 24–36)

## 2023-05-29 LAB — I-STAT CG4 LACTIC ACID, ED: Lactic Acid, Venous: 0.9 mmol/L (ref 0.5–1.9)

## 2023-05-29 MED ORDER — UMECLIDINIUM BROMIDE 62.5 MCG/ACT IN AEPB
1.0000 | INHALATION_SPRAY | Freq: Every day | RESPIRATORY_TRACT | Status: DC
  Administered 2023-05-31 – 2023-06-03 (×4): 1 via RESPIRATORY_TRACT
  Filled 2023-05-29: qty 7

## 2023-05-29 MED ORDER — DOCUSATE SODIUM 100 MG PO CAPS
100.0000 mg | ORAL_CAPSULE | Freq: Two times a day (BID) | ORAL | Status: DC
  Administered 2023-05-30 – 2023-06-02 (×8): 100 mg via ORAL
  Filled 2023-05-29 (×9): qty 1

## 2023-05-29 MED ORDER — IOHEXOL 350 MG/ML SOLN
80.0000 mL | Freq: Once | INTRAVENOUS | Status: AC | PRN
  Administered 2023-05-29: 80 mL via INTRAVENOUS

## 2023-05-29 MED ORDER — AMLODIPINE BESYLATE 5 MG PO TABS: 5.0000 mg | ORAL_TABLET | Freq: Every day | ORAL | Status: DC

## 2023-05-29 MED ORDER — SODIUM CHLORIDE 0.9 % IV SOLN
2.0000 g | Freq: Once | INTRAVENOUS | Status: AC
  Administered 2023-05-29: 2 g via INTRAVENOUS
  Filled 2023-05-29: qty 12.5

## 2023-05-29 MED ORDER — ACETAMINOPHEN 325 MG PO TABS
650.0000 mg | ORAL_TABLET | Freq: Four times a day (QID) | ORAL | Status: DC | PRN
  Administered 2023-05-31 – 2023-06-03 (×5): 650 mg via ORAL
  Filled 2023-05-29 (×5): qty 2

## 2023-05-29 MED ORDER — ASPIRIN 81 MG PO CHEW
81.0000 mg | CHEWABLE_TABLET | Freq: Two times a day (BID) | ORAL | Status: DC
  Administered 2023-05-30 – 2023-06-03 (×9): 81 mg via ORAL
  Filled 2023-05-29 (×9): qty 1

## 2023-05-29 MED ORDER — VANCOMYCIN HCL IN DEXTROSE 1-5 GM/200ML-% IV SOLN
1000.0000 mg | Freq: Once | INTRAVENOUS | Status: AC
  Administered 2023-05-29: 1000 mg via INTRAVENOUS
  Filled 2023-05-29: qty 200

## 2023-05-29 MED ORDER — LEVALBUTEROL HCL 0.63 MG/3ML IN NEBU: 0.6300 mg | INHALATION_SOLUTION | Freq: Four times a day (QID) | RESPIRATORY_TRACT | Status: DC | PRN

## 2023-05-29 MED ORDER — ONDANSETRON HCL 4 MG/2ML IJ SOLN: 4.0000 mg | Freq: Four times a day (QID) | INTRAMUSCULAR | Status: DC | PRN

## 2023-05-29 MED ORDER — THIAMINE MONONITRATE 100 MG PO TABS
100.0000 mg | ORAL_TABLET | Freq: Every day | ORAL | Status: DC
  Administered 2023-05-30 – 2023-06-03 (×5): 100 mg via ORAL
  Filled 2023-05-29 (×7): qty 1

## 2023-05-29 MED ORDER — FLUOXETINE HCL 20 MG PO CAPS
40.0000 mg | ORAL_CAPSULE | Freq: Every day | ORAL | Status: DC
  Administered 2023-05-30 – 2023-06-03 (×5): 40 mg via ORAL
  Filled 2023-05-29 (×5): qty 2

## 2023-05-29 MED ORDER — FLUTICASONE FUROATE-VILANTEROL 200-25 MCG/ACT IN AEPB
1.0000 | INHALATION_SPRAY | Freq: Every day | RESPIRATORY_TRACT | Status: DC
  Administered 2023-05-31 – 2023-06-03 (×4): 1 via RESPIRATORY_TRACT
  Filled 2023-05-29: qty 28

## 2023-05-29 MED ORDER — OMEPRAZOLE MAGNESIUM 20 MG PO TBEC: 40.0000 mg | DELAYED_RELEASE_TABLET | Freq: Every day | ORAL | Status: DC

## 2023-05-29 MED ORDER — LACTATED RINGERS IV SOLN: INTRAVENOUS | Status: AC

## 2023-05-29 MED ORDER — ONDANSETRON HCL 4 MG PO TABS
4.0000 mg | ORAL_TABLET | Freq: Four times a day (QID) | ORAL | Status: DC | PRN
  Administered 2023-06-02 (×2): 4 mg via ORAL
  Filled 2023-05-29 (×2): qty 1

## 2023-05-29 MED ORDER — LACTATED RINGERS IV BOLUS (SEPSIS)
1000.0000 mL | Freq: Once | INTRAVENOUS | Status: AC
  Administered 2023-05-29: 1000 mL via INTRAVENOUS

## 2023-05-29 MED ORDER — ACETAMINOPHEN 500 MG PO TABS
1000.0000 mg | ORAL_TABLET | Freq: Once | ORAL | Status: AC
  Administered 2023-05-29: 1000 mg via ORAL
  Filled 2023-05-29: qty 2

## 2023-05-29 MED ORDER — ACETAMINOPHEN 650 MG RE SUPP: 650.0000 mg | Freq: Four times a day (QID) | RECTAL | Status: DC | PRN

## 2023-05-29 MED ORDER — ROSUVASTATIN CALCIUM 10 MG PO TABS
20.0000 mg | ORAL_TABLET | Freq: Every day | ORAL | Status: DC
  Administered 2023-05-30 – 2023-06-03 (×5): 20 mg via ORAL
  Filled 2023-05-29 (×2): qty 2
  Filled 2023-05-29: qty 1
  Filled 2023-05-29 (×2): qty 2

## 2023-05-29 MED ORDER — MONTELUKAST SODIUM 10 MG PO TABS
10.0000 mg | ORAL_TABLET | Freq: Every day | ORAL | Status: DC
  Administered 2023-05-30 – 2023-06-03 (×5): 10 mg via ORAL
  Filled 2023-05-29 (×5): qty 1

## 2023-05-29 MED ORDER — LACTATED RINGERS IV BOLUS (SEPSIS)
250.0000 mL | Freq: Once | INTRAVENOUS | Status: AC
  Administered 2023-05-29: 250 mL via INTRAVENOUS

## 2023-05-29 MED ORDER — ENOXAPARIN SODIUM 40 MG/0.4ML IJ SOSY
40.0000 mg | PREFILLED_SYRINGE | Freq: Every day | INTRAMUSCULAR | Status: DC
  Administered 2023-05-30: 40 mg via SUBCUTANEOUS
  Filled 2023-05-29: qty 0.4

## 2023-05-29 MED ORDER — CEFTRIAXONE SODIUM 1 G IJ SOLR
2.0000 g | INTRAMUSCULAR | Status: DC
  Administered 2023-05-30 – 2023-06-01 (×3): 2 g via INTRAMUSCULAR
  Filled 2023-05-29 (×4): qty 20

## 2023-05-29 MED ORDER — METRONIDAZOLE 500 MG/100ML IV SOLN
500.0000 mg | Freq: Once | INTRAVENOUS | Status: AC
Start: 2023-05-29 — End: 2023-05-29
  Administered 2023-05-29: 500 mg via INTRAVENOUS
  Filled 2023-05-29: qty 100

## 2023-05-29 MED ORDER — MEMANTINE HCL 10 MG PO TABS
10.0000 mg | ORAL_TABLET | Freq: Two times a day (BID) | ORAL | Status: DC
  Administered 2023-05-30 – 2023-06-03 (×10): 10 mg via ORAL
  Filled 2023-05-29: qty 1
  Filled 2023-05-29: qty 2
  Filled 2023-05-29 (×6): qty 1
  Filled 2023-05-29: qty 2
  Filled 2023-05-29: qty 1

## 2023-05-29 MED ORDER — IPRATROPIUM BROMIDE 0.02 % IN SOLN
0.5000 mg | Freq: Three times a day (TID) | RESPIRATORY_TRACT | Status: DC
  Administered 2023-05-30 – 2023-06-03 (×13): 0.5 mg via RESPIRATORY_TRACT
  Filled 2023-05-29 (×15): qty 2.5

## 2023-05-29 MED ORDER — PANTOPRAZOLE SODIUM 40 MG PO TBEC
40.0000 mg | DELAYED_RELEASE_TABLET | Freq: Every day | ORAL | Status: DC
  Administered 2023-05-30 – 2023-06-03 (×5): 40 mg via ORAL
  Filled 2023-05-29 (×5): qty 1

## 2023-05-29 MED ORDER — BUPROPION HCL ER (XL) 150 MG PO TB24
150.0000 mg | ORAL_TABLET | Freq: Every day | ORAL | Status: DC
  Administered 2023-05-30 – 2023-06-03 (×5): 150 mg via ORAL
  Filled 2023-05-29 (×5): qty 1

## 2023-05-29 MED ORDER — LACTATED RINGERS IV BOLUS (SEPSIS)
500.0000 mL | Freq: Once | INTRAVENOUS | Status: AC
Start: 2023-05-29 — End: 2023-05-29
  Administered 2023-05-29: 500 mL via INTRAVENOUS

## 2023-05-29 MED ORDER — DONEPEZIL HCL 5 MG PO TABS
5.0000 mg | ORAL_TABLET | Freq: Every day | ORAL | Status: DC
  Administered 2023-05-30 – 2023-06-02 (×5): 5 mg via ORAL
  Filled 2023-05-29 (×5): qty 1

## 2023-05-29 MED ORDER — FLUTICASONE PROPIONATE 50 MCG/ACT NA SUSP
2.0000 | Freq: Every day | NASAL | Status: DC
  Administered 2023-05-30 – 2023-06-03 (×5): 2 via NASAL
  Filled 2023-05-29 (×2): qty 16

## 2023-05-29 NOTE — Progress Notes (Signed)
ED Pharmacy Antibiotic Sign Off An antibiotic consult was received from an ED provider for vancomycin and cefepime per pharmacy dosing for sepsis. A chart review was completed to assess appropriateness.   Recorded weight from 05/14/23 is 124lbs. Calculated CrCl using Scr 1.53 is ~26 ml/min.   The following one time order(s) were placed:  Vancomycin 1000mg  IV x1 Cefepime 2g IV x1  Further antibiotic and/or antibiotic pharmacy consults should be ordered by the admitting provider if indicated.   Thank you for allowing pharmacy to be a part of this patient's care.   Cherylin Mylar, PharmD Clinical Pharmacist  11/20/20243:22 PM

## 2023-05-29 NOTE — ED Notes (Signed)
Pt placed on purewick d/t hip injury/ surgery and unable to stand.

## 2023-05-29 NOTE — H&P (Incomplete)
History and Physical    Andrea Larson:[id number] DOB: 19-Feb-1946 DOA: 05/29/2023  PCP: Caesar Bookman, NP   Patient coming from: SNF   Chief Complaint:  Chief Complaint  Patient presents with   Altered Mental Status   Shortness of Breath   ED TRIAGE note:Pt BIBA from [facility] with c/o AMS xyesterday, pt usually A&Ox4, per staff. Hx COPD. Hx dementia.    20G R AC   127/77- HR 126- 95% 4L [place]- RR 26- CBG 146  HPI:  Andrea Larson is a 77 y.o. female with medical history significant of essential hypertension, GAD, dementia, CKD stage IIIb, COPD, vitamin B12 deficiency,  hyperlipidemia, chronic hypoxic respiratory failure 2 L oxygen at bedtime and recent femoral fracture status post hip arthroplasty 05/29/2023 presented to emergency department from nursing home due to altered mentation.  Per nursing home report patient usually underwent x 4.  Patient recently discharged from the hospital [place]/5/24.  Patient was admitted for closed  right femoral neck fracture underwent Hemi hip arthroplasty.  Patient was discharged to skilled nursing facility.   ED Course:  At presentation to ED patient's vitals are hemodynamically stable.  Blood pressure 134/9012, heart rate 118 and O2 sat 97% on 3 L. CMP unremarkable.  Sodium 136, potassium 4.6, bicarb 28, blood glucose 1 1, creatinine 1.43, albumin 3.1 GFR 37 anion gap 9. CBC unremarkable with stable H&H.  Normal pro time and INR.  Normal APTT.  Blood cultures are pending.  Respiratory panel negative.  Lactic acid within normal range.  UA Strow appearance, increased specific gravity and ketone positive.  Pending CT head. CT angiogram chest no acute pulmonary embolism, acute cardiopulmonary disease. CT abdomen normal finding.  Cholelithiasis.  No evidence of acute cholecystitis.  Periumbilical ventral hernia.  No bowel obstruction.  Left-sided pyelonephritis.  Head CT scan unremarkable.  ED physician reported that EMS  reported patient had fever at nursing home however there is no actual record of the fever on the chart.  Dr. Suezanne Jacquet reported that patient has left-sided CVA tenderness.  At this point unclear etiology of source of infection.  However patient is afebrile and hemodynamically stable.  Normal lactic acid.  In the ED patient has been treated with sepsis protocol and clued IV bolus 2 L and currently on LR 150 cc/h, cefepime, metronidazole and vancomycin.  Pending urine culture and blood cultures.  Hospital list has been consulted for further evaluation management of altered mentation/acute metabolic encephalopathy/delirium and left-sided pyelonephritis.  Review of Systems:  ROS  Past Medical History:  Diagnosis Date   Allergic rhinitis    Allergic rhinitis, cause unspecified 09/21/2011   Aortic stenosis 08/23/2016   Arthritis    Asthma    Asthma 09/21/2011   Colon polyps    COPD (chronic obstructive pulmonary disease) (HCC)    mild    Coronary artery calcification 08/23/2016   Dementia (HCC)    Depression with anxiety    HTN (hypertension)    Hyperlipidemia    UTI (lower urinary tract infection)     Past Surgical History:  Procedure Laterality Date   DENTAL SURGERY     pilonidial cyst     TONSILLECTOMY     TOTAL HIP ARTHROPLASTY Right 05/09/2023   Procedure: HEMI HIP ARTHROPLASTY ANTERIOR APPROACH;  Surgeon: Samson Frederic, MD;  Location: [facility];  Service: Orthopedics;  Laterality: Right;     reports that she quit smoking about 8 years ago. Her smoking use included cigarettes. She started  smoking about 63 years ago. She has a 41.3 pack-year smoking history. She has never used smokeless tobacco. She reports that she does not currently use alcohol. She reports that she does not use drugs.  Allergies  Allergen Reactions   Lipitor [Atorvastatin] Other (See Comments)    Myalgias    Family History  Problem Relation Age of Onset   Alcohol abuse Other    Arthritis Other    Heart  disease Other    Stroke Other    Hypertension Other    Hypertension Father    Stroke Father    Atrial fibrillation Brother    Other Mother        unsure of medical history   Colon cancer Neg Hx     Prior to Admission medications   Medication Sig Start Date End Date Taking? Authorizing Provider  acetaminophen (TYLENOL) 325 MG tablet Take 650 mg by mouth every 6 (six) hours as needed for mild pain or headache.   Yes [provider]  amLODipine (NORVASC) 5 MG tablet TAKE ONE TABLET BY MOUTH DAILY Patient taking differently: Take 5 mg by mouth in the morning. 10/29/22  Yes Ngetich, Dinah C, NP  aspirin (ASPIRIN CHILDRENS) 81 MG chewable tablet Chew 1 tablet (81 mg total) by mouth 2 (two) times daily with a meal. 05/10/23 06/24/23 Yes Hill, Avery S, PA-C  buPROPion (WELLBUTRIN XL) 150 MG 24 hr tablet TAKE ONE TABLET BY MOUTH DAILY Patient taking differently: Take 150 mg by mouth in the morning. 10/29/22  Yes Ngetich, Dinah C, NP  clonazePAM (KLONOPIN) 0.5 MG tablet Take 1 tablet (0.5 mg total) by mouth 2 (two) times daily as needed for up to 7 days for anxiety. Patient taking differently: Take 0.5 mg by mouth once. 05/14/23 05/29/23 Yes Dorcas Carrow, MD  docusate sodium (COLACE) 100 MG capsule Take 1 capsule (100 mg total) by mouth 2 (two) times daily. 05/14/23  Yes Ghimire, Lyndel Safe, MD  donepezil (ARICEPT) 5 MG tablet TAKE ONE TABLET BY MOUTH AT BEDTIME Patient taking differently: Take 5 mg by mouth at bedtime. 10/29/22  Yes Ngetich, Dinah C, NP  FLUoxetine (PROZAC) 40 MG capsule TAKE ONE CAPSULE BY MOUTH DAILY Patient taking differently: Take 40 mg by mouth in the morning. 07/30/22  Yes Ngetich, Dinah C, NP  fluticasone (FLONASE) 50 MCG/ACT nasal spray USE TWO SPRAYS in each nostril ONCE DAILY. Patient taking differently: Place 2 sprays into both nostrils in the morning. 08/14/21  Yes Corwin Levins, MD  folic acid (FOLVITE) 1 MG tablet TAKE ONE TABLET BY MOUTH DAILY Patient taking  differently: Take 1 mg by mouth in the morning. 07/30/22  Yes Ngetich, Dinah C, NP  HYDROcodone-acetaminophen (NORCO/VICODIN) 5-325 MG tablet Take 1 tablet by mouth every 4 (four) hours as needed for moderate pain (pain score 4-6).   Yes [provider]  ipratropium (ATROVENT) 0.02 % nebulizer solution NEBULIZE CONTENTS OF 1 VIAL 3 TIMES A DAY Patient taking differently: Take 0.5 mg by nebulization 3 (three) times daily. 04/16/22  Yes Ngetich, Dinah C, NP  levalbuterol (XOPENEX) 0.63 MG/3ML nebulizer solution USE ONE vial via nebulization every SIX hours as needed FOR SHORTNESS OF BREATH OR wheezing. Patient taking differently: Take 0.63 mg by nebulization every 6 (six) hours as needed for shortness of breath or wheezing. 06/23/21  Yes Ngetich, Dinah C, NP  melatonin 3 MG TABS tablet Take 3 mg by mouth at bedtime.   Yes [provider]  memantine (NAMENDA) 10 MG tablet TAKE  ONE TABLET BY MOUTH TWICE DAILY Patient taking differently: Take 10 mg by mouth in the morning and at bedtime. 07/30/22  Yes Ngetich, Dinah C, NP  mirtazapine (REMERON) 15 MG tablet TAKE ONE TABLET BY MOUTH AT BEDTIME 07/30/22  Yes Ngetich, Dinah C, NP  montelukast (SINGULAIR) 10 MG tablet TAKE ONE TABLET BY MOUTH DAILY Patient taking differently: Take 10 mg by mouth in the morning. 10/29/22  Yes Ngetich, Dinah C, NP  Multiple Vitamin (MULTIVITAMIN WITH MINERALS) TABS tablet Take 1 tablet by mouth daily. 04/12/21  Yes Rodolph Bong, MD  Nutritional Supplements [facility] ACTIVE) LIQD Take 1 Can by mouth daily.   Yes [provider]  omeprazole (PRILOSEC OTC) 20 MG tablet Take 40 mg by mouth daily.   Yes [provider]  ondansetron (ZOFRAN) 4 MG tablet Take 4 mg by mouth every 6 (six) hours as needed for nausea.  05/29/23 Yes [provider]  polyethylene glycol (MIRALAX / GLYCOLAX) 17 g packet Take 17 g by mouth daily as needed for mild constipation. 05/14/23  Yes Ghimire, Lyndel Safe, MD   rosuvastatin (CRESTOR) 20 MG tablet TAKE ONE TABLET BY MOUTH DAILY Patient taking differently: Take 20 mg by mouth at bedtime. 10/29/22  Yes Ngetich, Dinah C, NP  thiamine 100 MG tablet Take 1 tablet (100 mg total) by mouth daily. 04/17/21  Yes Ngetich, Dinah C, NP  TRELEGY ELLIPTA 200-62.5-25 MCG/ACT AEPB INHALE 1 PUFF INTO THE LUNGS ONCE DAILY AS DIRECTED. Patient taking differently: Inhale 1 puff into the lungs in the morning. 08/30/22  Yes Ngetich, Dinah C, NP  vitamin B-12 (CYANOCOBALAMIN) 1000 MCG tablet Take 1 tablet (1,000 mcg total) by mouth daily. 04/11/21  Yes Rodolph Bong, MD  Nutritional Supplements (ENSURE ACTIVE HIGH PROTEIN) LIQD Take 1 Can by mouth daily. Patient not taking: Reported on 05/29/2023 01/11/23   Ngetich, Dinah C, NP  omeprazole (PRILOSEC) 20 MG capsule Take 2 capsules (40 mg total) by mouth daily as needed. Patient not taking: Reported on 05/29/2023 10/31/22   Ngetich, Donalee Citrin, NP  Respiratory Therapy Supplies (FLUTTER) DEVI Use as directed 08/19/17   Chilton Greathouse, MD     Physical Exam: Vitals:   05/29/23 1800 05/29/23 1834 05/29/23 2051 05/29/23 2100  BP: 110/77 120/77  (!) 126/98  Pulse: (!) 118 (!) 113  69  Resp:  16  16  Temp:   98.4 F (36.9 C) 98.4 F (36.9 C)  TempSrc:      SpO2: 97% 98%  98%    Physical Exam   Labs on Admission: I have personally reviewed following labs and imaging studies  CBC: Recent Labs  Lab 05/29/23 1528 05/29/23 1535  WBC 8.8  --   NEUTROABS 7.0  --   HGB 9.7* 10.5*  HCT 31.3* 31.0*  MCV 96.6  --   PLT 449*  --    Basic Metabolic Panel: Recent Labs  Lab 05/29/23 1528 05/29/23 1535  NA 136 138  K 4.6 4.7  CL 99 100  CO2 28  --   GLUCOSE 101* 100*  BUN 20 19  CREATININE 1.43* 1.50*  CALCIUM 8.4*  --    GFR: CrCl cannot be calculated (Unknown ideal weight.). Liver Function Tests: Recent Labs  Lab 05/29/23 1528  AST 21  ALT 14  ALKPHOS 177*  BILITOT 0.4  PROT 6.7  ALBUMIN 3.1*   No  results for input(s): "LIPASE", "AMYLASE" in the last 168 hours. No results for input(s): "AMMONIA" in the last 168 hours.  Coagulation Profile: Recent Labs  Lab 05/29/23 1528  INR 1.0   Cardiac Enzymes: No results for input(s): "CKTOTAL", "CKMB", "CKMBINDEX", "TROPONINI", "TROPONINIHS" in the last 168 hours. BNP (last 3 results) No results for input(s): "BNP" in the last 8760 hours. HbA1C: No results for input(s): "HGBA1C" in the last 72 hours. CBG: No results for input(s): "GLUCAP" in the last 168 hours. Lipid Profile: No results for input(s): "CHOL", "HDL", "LDLCALC", "TRIG", "CHOLHDL", "LDLDIRECT" in the last 72 hours. Thyroid Function Tests: No results for input(s): "TSH", "T4TOTAL", "FREET4", "T3FREE", "THYROIDAB" in the last 72 hours. Anemia Panel: No results for input(s): "VITAMINB12", "FOLATE", "FERRITIN", "TIBC", "IRON", "RETICCTPCT" in the last 72 hours. Urine analysis:    Component Value Date/Time   COLORURINE STRAW (A) 05/29/2023 1843   APPEARANCEUR CLEAR 05/29/2023 1843   LABSPEC 1.031 (H) 05/29/2023 1843   PHURINE 7.0 05/29/2023 1843   GLUCOSEU NEGATIVE 05/29/2023 1843   GLUCOSEU 100 (A) 03/20/2021 1556   HGBUR NEGATIVE 05/29/2023 1843   HGBUR negative 06/20/2010 1406   BILIRUBINUR NEGATIVE 05/29/2023 1843   BILIRUBINUR Negative 04/03/2021 1536   KETONESUR 5 (A) 05/29/2023 1843   PROTEINUR NEGATIVE 05/29/2023 1843   UROBILINOGEN negative (A) 04/03/2021 1536   UROBILINOGEN 0.2 03/20/2021 1556   NITRITE NEGATIVE 05/29/2023 1843   LEUKOCYTESUR NEGATIVE 05/29/2023 1843    Radiological Exams on Admission: I have personally reviewed images CT Head Wo Contrast  Result Date: 05/29/2023 CLINICAL DATA:  Altered mental status. EXAM: CT HEAD WITHOUT CONTRAST TECHNIQUE: Contiguous axial images were obtained from the base of the skull through the vertex without intravenous contrast. RADIATION DOSE REDUCTION: This exam was performed according to the departmental  dose-optimization program which includes automated exposure control, adjustment of the mA and/or kV according to patient size and/or use of iterative reconstruction technique. COMPARISON:  Head CT dated 04/07/2021. FINDINGS: Brain: Mild age-related atrophy and chronic microvascular ischemic changes. There is no acute intracranial hemorrhage. No mass effect or midline shift. No extra-axial fluid collection. Vascular: No hyperdense vessel or unexpected calcification. Skull: Normal. Negative for fracture or focal lesion. Sinuses/Orbits: Mild mucoperiosteal thickening of paranasal sinuses. The mastoid air cells are clear. Other: None IMPRESSION: 1. No acute intracranial pathology. 2. Mild age-related atrophy and chronic microvascular ischemic changes. Electronically Signed   By: Elgie Collard M.D.   On: 05/29/2023 23:36   CT ABDOMEN PELVIS W CONTRAST  Addendum Date: 05/29/2023   ADDENDUM REPORT: 05/29/2023 19:21 ADDENDUM: After further review and comparison to today's chest CT, there are questionable areas of decreased perfusion in the left kidney, mostly upper and lower poles, but there is extensive respiratory motion which limits evaluation. Recommend clinical correlation for possible pyelonephritis. Electronically Signed   By: Charlett Nose M.D.   On: 05/29/2023 19:21   Result Date: 05/29/2023 CLINICAL DATA:  Abdominal pain, acute, nonlocalized EXAM: CT ABDOMEN AND PELVIS WITH CONTRAST TECHNIQUE: Multidetector CT imaging of the abdomen and pelvis was performed using the standard protocol following bolus administration of intravenous contrast. RADIATION DOSE REDUCTION: This exam was performed according to the departmental dose-optimization program which includes automated exposure control, adjustment of the mA and/or kV according to patient size and/or use of iterative reconstruction technique. CONTRAST:  80mL OMNIPAQUE IOHEXOL 350 MG/ML SOLN COMPARISON:  03/27/2019 FINDINGS: Lower chest: No acute  abnormality. Coronary artery disease, aortic atherosclerosis. Hepatobiliary: Small layering gallstones within the gallbladder. No focal hepatic abnormality or biliary ductal dilatation. Pancreas: No focal abnormality or ductal dilatation. Spleen: No focal abnormality.  Normal size. Adrenals/Urinary Tract:  No adrenal abnormality. No focal renal abnormality. No stones or hydronephrosis. Urinary bladder decompressed. Stomach/Bowel: Sigmoid diverticulosis. No active diverticulitis. Stomach and small bowel decompressed, unremarkable. Vascular/Lymphatic: Diffuse aortoiliac atherosclerosis. No evidence of aneurysm or adenopathy. Reproductive: Uterus and adnexa unremarkable.  No mass. Other: No free fluid or free air. Periumbilical hernias noted containing fat on the left and fat and a portion of the anterior wall of the transverse colon on the right. No bowel obstruction. Musculoskeletal: No acute bony abnormality. IMPRESSION: No acute findings in the abdomen or pelvis. Diffuse aortoiliac atherosclerosis, coronary artery disease. Cholelithiasis.  No evidence of acute cholecystitis. Periumbilical ventral hernias containing fat and anterior wall of the transverse colon. No bowel obstruction. Electronically Signed: By: Charlett Nose M.D. On: 05/29/2023 19:13   CT Angio Chest PE W and/or Wo Contrast  Result Date: 05/29/2023 CLINICAL DATA:  Pulmonary embolism (PE) suspected, high prob. Altered mental status. EXAM: CT ANGIOGRAPHY CHEST WITH CONTRAST TECHNIQUE: Multidetector CT imaging of the chest was performed using the standard protocol during bolus administration of intravenous contrast. Multiplanar CT image reconstructions and MIPs were obtained to evaluate the vascular anatomy. RADIATION DOSE REDUCTION: This exam was performed according to the departmental dose-optimization program which includes automated exposure control, adjustment of the mA and/or kV according to patient size and/or use of iterative reconstruction  technique. CONTRAST:  80mL OMNIPAQUE IOHEXOL 350 MG/ML SOLN COMPARISON:  01/08/2023 FINDINGS: Cardiovascular: No filling defects in the pulmonary arteries to suggest pulmonary emboli. Heart is normal size. Aorta is normal caliber. Coronary artery and aortic atherosclerosis. Mediastinum/Nodes: No mediastinal, hilar, or axillary adenopathy. Trachea and esophagus are unremarkable. Thyroid unremarkable. Lungs/Pleura: No confluent opacities or effusions. Upper Abdomen: No acute findings Musculoskeletal: No acute bony abnormality. Numerous moderate to severe compression fractures in the midthoracic spine, unchanged since prior study. Review of the MIP images confirms the above findings. IMPRESSION: No evidence of pulmonary embolus. No acute cardiopulmonary disease. Coronary artery disease. Aortic Atherosclerosis (ICD10-I70.0). Electronically Signed   By: Charlett Nose M.D.   On: 05/29/2023 19:17   DG Chest Port 1 View  Result Date: 05/29/2023 CLINICAL DATA:  Altered mental status. EXAM: PORTABLE CHEST 1 VIEW COMPARISON:  May 09, 2023. FINDINGS: The heart size and mediastinal contours are within normal limits. Both lungs are clear. The visualized skeletal structures are unremarkable. IMPRESSION: No active disease. Electronically Signed   By: Lupita Raider M.D.   On: 05/29/2023 18:11    EKG: My personal interpretation of EKG shows: ***    Assessment/Plan: Active Problems:   Acute metabolic encephalopathy   Pyelonephritis of left kidney   Hyperlipidemia   COPD (chronic obstructive pulmonary disease) (HCC)   Essential hypertension   Chronic kidney disease (CKD), stage III (moderate) (HCC)   Chronic hypoxic respiratory failure (HCC)   Dementia without behavioral disturbance (HCC)   GAD (generalized anxiety disorder)    Assessment and Plan: Acute metabolic encephalopathy  Left-sided pyelonephritis  Hyperlipidemia  History of COPD Chronic hypoxic respiratory failure  Dementia  Generalized  anxiety disorder   Chronic constipation  DVT prophylaxis:  Lovenox Code Status:  DNR/DNI(Do NOT Intubate) Diet:  Family Communication:  *** Family was present at bedside, at the time of interview.  Opportunity was given to ask question and all questions were answered satisfactorily.  Disposition Plan:  ***  Consults:  ***  Admission status:   {Blank single:19197::"Observation","Inpatient"}, {Blank single:19197::"Med-Surg","Telemetry bed","Step Down Unit"}  Severity of Illness: {Observation/Inpatient:21159}    Tereasa Coop, MD [facility]  How to contact  the [facility] Attending or Consulting provider 7A - 7P or covering provider during after hours 7P -7A, for this patient.  Check the care team in [facility] and look for a) attending/consulting [facility] provider listed and b) the [facility] team listed Log into [email or web address] and use [place]'s universal password to access. If you do not have the password, please contact the hospital operator. Locate the [facility] provider you are looking for under [facility] and page to a number that you can be directly reached. If you still have difficulty reaching the provider, please page the [facility] (Director on Call) for the Hospitalists listed on amion for assistance.  05/29/2023, 11:49 PM

## 2023-05-29 NOTE — Progress Notes (Signed)
Elink following for sepsis protocol. 

## 2023-05-29 NOTE — ED Provider Notes (Signed)
EMERGENCY DEPARTMENT AT [facility] Provider Note  CSN: [id number] Arrival date & time: 05/29/23 1441  Chief Complaint(s) Altered Mental Status and Shortness of Breath  HPI Andrea Larson is a 77 y.o. female history of COPD, dementia, hypertension, hyperlipidemia, recent admission for hip replacement after fall, presenting from nursing facility for altered mental status.  Patient was reportedly more lethargic today, with fever, diaphoresis and increased weakness.  Paramedics were called and brought her to the emergency department.  Unknown temperature fever at nursing facility.  Patient is demented and not able to provide much history but denies any pain, knows she is at [facility].   Past Medical History Past Medical History:  Diagnosis Date   Allergic rhinitis    Allergic rhinitis, cause unspecified 09/21/2011   Aortic stenosis 08/23/2016   Arthritis    Asthma    Asthma 09/21/2011   Colon polyps    COPD (chronic obstructive pulmonary disease) (HCC)    mild    Coronary artery calcification 08/23/2016   Dementia (HCC)    Depression with anxiety    HTN (hypertension)    Hyperlipidemia    UTI (lower urinary tract infection)    Patient Active Problem List   Diagnosis Date Noted   Acute metabolic encephalopathy 05/29/2023   Pyelonephritis of left kidney 05/29/2023   Closed fracture of multiple pubic rami, right, initial encounter (HCC) 05/08/2023   Closed displaced fracture of right femoral neck (HCC) 05/08/2023   History of COPD 05/08/2023   GAD (generalized anxiety disorder) 05/08/2023   Fall at nursing home. 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   Dysphagia    Protein-calorie malnutrition, severe 04/10/2021   Aortic atherosclerosis (HCC) 04/04/2021   Depression with anxiety    History of colon polyps 10/17/2020   Vitamin D  deficiency 10/17/2020   Dementia without behavioral disturbance (HCC) 08/09/2020   Gait abnormality 08/09/2020   Chronic hypoxic respiratory failure (HCC) 04/10/2020   Peripheral edema 05/21/2018   Gait disorder 05/21/2018   Chronic kidney disease (CKD), stage III (moderate) (HCC) 04/28/2018   Malnutrition of moderate degree 04/13/2018   Essential hypertension 04/11/2018   Gallstones 04/11/2018   Coronary artery calcification 08/23/2016   Aortic stenosis 08/23/2016   Insomnia 08/18/2015   GERD (gastroesophageal reflux disease) 08/18/2015   Back pain 10/28/2012   Asthma 09/21/2011   Allergic rhinitis 09/21/2011   Colon polyps 09/21/2011   Encounter for well adult exam with abnormal findings 09/15/2011   Spondylosis 10/21/2008   Overweight 02/10/2008   ANXIETY DEPRESSION 02/10/2008   COPD (chronic obstructive pulmonary disease) (HCC) 02/10/2008   EMPHYSEMA NEC 01/30/2007   Hyperlipidemia 12/31/2006   Home Medication(s) Prior to Admission medications   Medication Sig Start Date End Date Taking? Authorizing Provider  acetaminophen (TYLENOL) 325 MG tablet Take 650 mg by mouth every 6 (six) hours as needed for mild pain or headache.   Yes [provider]  amLODipine (NORVASC) 5 MG tablet TAKE ONE TABLET BY MOUTH DAILY Patient taking differently: Take 5 mg by mouth in the morning. 10/29/22  Yes Ngetich, Dinah C, NP  aspirin (ASPIRIN CHILDRENS) 81 MG chewable tablet Chew 1 tablet (81 mg total) by mouth 2 (two) times daily with a meal. 05/10/23 06/24/23 Yes Hill, Avery S, PA-C  buPROPion (WELLBUTRIN XL) 150 MG 24 hr tablet TAKE ONE TABLET BY MOUTH DAILY Patient taking differently: Take 150 mg by  mouth in the morning. 10/29/22  Yes Ngetich, Dinah C, NP  clonazePAM (KLONOPIN) 0.5 MG tablet Take 1 tablet (0.5 mg total) by mouth 2 (two) times daily as needed for up to 7 days for anxiety. Patient taking differently: Take 0.5 mg by mouth once. 05/14/23 05/29/23 Yes Dorcas Carrow, MD   docusate sodium (COLACE) 100 MG capsule Take 1 capsule (100 mg total) by mouth 2 (two) times daily. 05/14/23  Yes Ghimire, Lyndel Safe, MD  donepezil (ARICEPT) 5 MG tablet TAKE ONE TABLET BY MOUTH AT BEDTIME Patient taking differently: Take 5 mg by mouth at bedtime. 10/29/22  Yes Ngetich, Dinah C, NP  FLUoxetine (PROZAC) 40 MG capsule TAKE ONE CAPSULE BY MOUTH DAILY Patient taking differently: Take 40 mg by mouth in the morning. 07/30/22  Yes Ngetich, Dinah C, NP  fluticasone (FLONASE) 50 MCG/ACT nasal spray USE TWO SPRAYS in each nostril ONCE DAILY. Patient taking differently: Place 2 sprays into both nostrils in the morning. 08/14/21  Yes Corwin Levins, MD  folic acid (FOLVITE) 1 MG tablet TAKE ONE TABLET BY MOUTH DAILY Patient taking differently: Take 1 mg by mouth in the morning. 07/30/22  Yes Ngetich, Dinah C, NP  HYDROcodone-acetaminophen (NORCO/VICODIN) 5-325 MG tablet Take 1 tablet by mouth every 4 (four) hours as needed for moderate pain (pain score 4-6).   Yes [provider]  ipratropium (ATROVENT) 0.02 % nebulizer solution NEBULIZE CONTENTS OF 1 VIAL 3 TIMES A DAY Patient taking differently: Take 0.5 mg by nebulization 3 (three) times daily. 04/16/22  Yes Ngetich, Dinah C, NP  levalbuterol (XOPENEX) 0.63 MG/3ML nebulizer solution USE ONE vial via nebulization every SIX hours as needed FOR SHORTNESS OF BREATH OR wheezing. Patient taking differently: Take 0.63 mg by nebulization every 6 (six) hours as needed for shortness of breath or wheezing. 06/23/21  Yes Ngetich, Dinah C, NP  melatonin 3 MG TABS tablet Take 3 mg by mouth at bedtime.   Yes [provider]  memantine (NAMENDA) 10 MG tablet TAKE ONE TABLET BY MOUTH TWICE DAILY Patient taking differently: Take 10 mg by mouth in the morning and at bedtime. 07/30/22  Yes Ngetich, Dinah C, NP  mirtazapine (REMERON) 15 MG tablet TAKE ONE TABLET BY MOUTH AT BEDTIME 07/30/22  Yes Ngetich, Dinah C, NP  montelukast (SINGULAIR) 10 MG tablet  TAKE ONE TABLET BY MOUTH DAILY Patient taking differently: Take 10 mg by mouth in the morning. 10/29/22  Yes Ngetich, Dinah C, NP  Multiple Vitamin (MULTIVITAMIN WITH MINERALS) TABS tablet Take 1 tablet by mouth daily. 04/12/21  Yes Rodolph Bong, MD  Nutritional Supplements [facility] ACTIVE) LIQD Take 1 Can by mouth daily.   Yes [provider]  omeprazole (PRILOSEC OTC) 20 MG tablet Take 40 mg by mouth daily.   Yes [provider]  ondansetron (ZOFRAN) 4 MG tablet Take 4 mg by mouth every 6 (six) hours as needed for nausea.  05/29/23 Yes [provider]  polyethylene glycol (MIRALAX / GLYCOLAX) 17 g packet Take 17 g by mouth daily as needed for mild constipation. 05/14/23  Yes Ghimire, Lyndel Safe, MD  rosuvastatin (CRESTOR) 20 MG tablet TAKE ONE TABLET BY MOUTH DAILY Patient taking differently: Take 20 mg by mouth at bedtime. 10/29/22  Yes Ngetich, Dinah C, NP  thiamine 100 MG tablet Take 1 tablet (100 mg total) by mouth daily. 04/17/21  Yes Ngetich, Dinah C, NP  TRELEGY ELLIPTA 200-62.5-25 MCG/ACT AEPB INHALE 1 PUFF INTO THE LUNGS ONCE DAILY AS DIRECTED. Patient taking differently:  Inhale 1 puff into the lungs in the morning. 08/30/22  Yes Ngetich, Dinah C, NP  vitamin B-12 (CYANOCOBALAMIN) 1000 MCG tablet Take 1 tablet (1,000 mcg total) by mouth daily. 04/11/21  Yes Rodolph Bong, MD  Nutritional Supplements (ENSURE ACTIVE HIGH PROTEIN) LIQD Take 1 Can by mouth daily. Patient not taking: Reported on 05/29/2023 01/11/23   Ngetich, Dinah C, NP  omeprazole (PRILOSEC) 20 MG capsule Take 2 capsules (40 mg total) by mouth daily as needed. Patient not taking: Reported on 05/29/2023 10/31/22   Ngetich, Donalee Citrin, NP  Respiratory Therapy Supplies (FLUTTER) DEVI Use as directed 08/19/17   Chilton Greathouse, MD                                                                                                                                    Past Surgical History Past Surgical History:   Procedure Laterality Date   DENTAL SURGERY     pilonidial cyst     TONSILLECTOMY     TOTAL HIP ARTHROPLASTY Right 05/09/2023   Procedure: HEMI HIP ARTHROPLASTY ANTERIOR APPROACH;  Surgeon: Samson Frederic, MD;  Location: [facility];  Service: Orthopedics;  Laterality: Right;   Family History Family History  Problem Relation Age of Onset   Alcohol abuse Other    Arthritis Other    Heart disease Other    Stroke Other    Hypertension Other    Hypertension Father    Stroke Father    Atrial fibrillation Brother    Other Mother        unsure of medical history   Colon cancer Neg Hx     Social History Social History   Tobacco Use   Smoking status: Former    Current packs/day: 0.00    Average packs/day: 0.8 packs/day for 55.0 years (41.3 ttl pk-yrs)    Types: Cigarettes    Start date: 04/17/1960    Quit date: 04/18/2015    Years since quitting: 8.1   Smokeless tobacco: Never  Vaping Use   Vaping status: Never Used  Substance Use Topics   Alcohol use: Not Currently    Comment: wine couple times each week - daily at times   Drug use: Never   Allergies Lipitor [atorvastatin]  Review of Systems Review of Systems  All other systems reviewed and are negative.   Physical Exam Vital Signs  I have reviewed the triage vital signs BP (!) 126/98   Pulse 69   Temp 98.4 F (36.9 C)   Resp 16   SpO2 98%  Physical Exam Vitals and nursing note reviewed.  Constitutional:      General: She is not in acute distress.    Appearance: She is well-developed. She is ill-appearing.  HENT:     Head: Normocephalic and atraumatic.     Mouth/Throat:     Mouth: Mucous membranes are dry.  Eyes:     Pupils: Pupils are equal,  round, and reactive to light.  Cardiovascular:     Rate and Rhythm: Normal rate and regular rhythm.     Heart sounds: No murmur heard. Pulmonary:     Effort: Pulmonary effort is normal. Tachypnea present. No respiratory distress.     Breath sounds: Normal breath  sounds.  Abdominal:     General: Abdomen is flat.     Palpations: Abdomen is soft.     Tenderness: There is no abdominal tenderness. There is right CVA tenderness and left CVA tenderness.     Hernia: A hernia (soft, reducible, periumbilical) is present.     Comments: Mild diffuse ttp   Musculoskeletal:        General: No tenderness.     Right lower leg: No edema.     Left lower leg: No edema.  Skin:    General: Skin is warm and dry.  Neurological:     General: No focal deficit present.     Mental Status: She is alert. Mental status is at baseline.     Comments: No cranial nerve deficit, moves all 4 extremities, follows commands, oriented to self, year, that she is in the hospital, thinks she is here because "I am here for my babies"  Psychiatric:        Mood and Affect: Mood normal.        Behavior: Behavior normal.     ED Results and Treatments Labs (all labs ordered are listed, but only abnormal results are displayed) Labs Reviewed  COMPREHENSIVE METABOLIC PANEL - Abnormal; Notable for the following components:      Result Value   Glucose, Bld 101 (*)    Creatinine, Ser 1.43 (*)    Calcium 8.4 (*)    Albumin 3.1 (*)    Alkaline Phosphatase 177 (*)    GFR, Estimated 37 (*)    All other components within normal limits  CBC WITH DIFFERENTIAL/PLATELET - Abnormal; Notable for the following components:   RBC 3.24 (*)    Hemoglobin 9.7 (*)    HCT 31.3 (*)    Platelets 449 (*)    All other components within normal limits  URINALYSIS, W/ REFLEX TO CULTURE (INFECTION SUSPECTED) - Abnormal; Notable for the following components:   Color, Urine STRAW (*)    Specific Gravity, Urine 1.031 (*)    Ketones, ur 5 (*)    All other components within normal limits  I-STAT CHEM 8, ED - Abnormal; Notable for the following components:   Creatinine, Ser 1.50 (*)    Glucose, Bld 100 (*)    Calcium, Ion 1.11 (*)    Hemoglobin 10.5 (*)    HCT 31.0 (*)    All other components within normal  limits  RESP PANEL BY RT-PCR (RSV, FLU A&B, COVID)  RVPGX2  CULTURE, BLOOD (ROUTINE X 2)  CULTURE, BLOOD (ROUTINE X 2)  URINE CULTURE  PROTIME-INR  APTT  COMPREHENSIVE METABOLIC PANEL  CBC  I-STAT CG4 LACTIC ACID, ED  I-STAT CG4 LACTIC ACID, ED  Radiology CT Head Wo Contrast  Result Date: 05/29/2023 CLINICAL DATA:  Altered mental status. EXAM: CT HEAD WITHOUT CONTRAST TECHNIQUE: Contiguous axial images were obtained from the base of the skull through the vertex without intravenous contrast. RADIATION DOSE REDUCTION: This exam was performed according to the departmental dose-optimization program which includes automated exposure control, adjustment of the mA and/or kV according to patient size and/or use of iterative reconstruction technique. COMPARISON:  Head CT dated 04/07/2021. FINDINGS: Brain: Mild age-related atrophy and chronic microvascular ischemic changes. There is no acute intracranial hemorrhage. No mass effect or midline shift. No extra-axial fluid collection. Vascular: No hyperdense vessel or unexpected calcification. Skull: Normal. Negative for fracture or focal lesion. Sinuses/Orbits: Mild mucoperiosteal thickening of paranasal sinuses. The mastoid air cells are clear. Other: None IMPRESSION: 1. No acute intracranial pathology. 2. Mild age-related atrophy and chronic microvascular ischemic changes. Electronically Signed   By: Elgie Collard M.D.   On: 05/29/2023 23:36   CT ABDOMEN PELVIS W CONTRAST  Addendum Date: 05/29/2023   ADDENDUM REPORT: 05/29/2023 19:21 ADDENDUM: After further review and comparison to today's chest CT, there are questionable areas of decreased perfusion in the left kidney, mostly upper and lower poles, but there is extensive respiratory motion which limits evaluation. Recommend clinical correlation for possible pyelonephritis.  Electronically Signed   By: Charlett Nose M.D.   On: 05/29/2023 19:21   Result Date: 05/29/2023 CLINICAL DATA:  Abdominal pain, acute, nonlocalized EXAM: CT ABDOMEN AND PELVIS WITH CONTRAST TECHNIQUE: Multidetector CT imaging of the abdomen and pelvis was performed using the standard protocol following bolus administration of intravenous contrast. RADIATION DOSE REDUCTION: This exam was performed according to the departmental dose-optimization program which includes automated exposure control, adjustment of the mA and/or kV according to patient size and/or use of iterative reconstruction technique. CONTRAST:  80mL OMNIPAQUE IOHEXOL 350 MG/ML SOLN COMPARISON:  03/27/2019 FINDINGS: Lower chest: No acute abnormality. Coronary artery disease, aortic atherosclerosis. Hepatobiliary: Small layering gallstones within the gallbladder. No focal hepatic abnormality or biliary ductal dilatation. Pancreas: No focal abnormality or ductal dilatation. Spleen: No focal abnormality.  Normal size. Adrenals/Urinary Tract: No adrenal abnormality. No focal renal abnormality. No stones or hydronephrosis. Urinary bladder decompressed. Stomach/Bowel: Sigmoid diverticulosis. No active diverticulitis. Stomach and small bowel decompressed, unremarkable. Vascular/Lymphatic: Diffuse aortoiliac atherosclerosis. No evidence of aneurysm or adenopathy. Reproductive: Uterus and adnexa unremarkable.  No mass. Other: No free fluid or free air. Periumbilical hernias noted containing fat on the left and fat and a portion of the anterior wall of the transverse colon on the right. No bowel obstruction. Musculoskeletal: No acute bony abnormality. IMPRESSION: No acute findings in the abdomen or pelvis. Diffuse aortoiliac atherosclerosis, coronary artery disease. Cholelithiasis.  No evidence of acute cholecystitis. Periumbilical ventral hernias containing fat and anterior wall of the transverse colon. No bowel obstruction. Electronically Signed: By: Charlett Nose M.D. On: 05/29/2023 19:13   CT Angio Chest PE W and/or Wo Contrast  Result Date: 05/29/2023 CLINICAL DATA:  Pulmonary embolism (PE) suspected, high prob. Altered mental status. EXAM: CT ANGIOGRAPHY CHEST WITH CONTRAST TECHNIQUE: Multidetector CT imaging of the chest was performed using the standard protocol during bolus administration of intravenous contrast. Multiplanar CT image reconstructions and MIPs were obtained to evaluate the vascular anatomy. RADIATION DOSE REDUCTION: This exam was performed according to the departmental dose-optimization program which includes automated exposure control, adjustment of the mA and/or kV according to patient size and/or use of iterative reconstruction technique. CONTRAST:  80mL OMNIPAQUE IOHEXOL 350 MG/ML SOLN  COMPARISON:  01/08/2023 FINDINGS: Cardiovascular: No filling defects in the pulmonary arteries to suggest pulmonary emboli. Heart is normal size. Aorta is normal caliber. Coronary artery and aortic atherosclerosis. Mediastinum/Nodes: No mediastinal, hilar, or axillary adenopathy. Trachea and esophagus are unremarkable. Thyroid unremarkable. Lungs/Pleura: No confluent opacities or effusions. Upper Abdomen: No acute findings Musculoskeletal: No acute bony abnormality. Numerous moderate to severe compression fractures in the midthoracic spine, unchanged since prior study. Review of the MIP images confirms the above findings. IMPRESSION: No evidence of pulmonary embolus. No acute cardiopulmonary disease. Coronary artery disease. Aortic Atherosclerosis (ICD10-I70.0). Electronically Signed   By: Charlett Nose M.D.   On: 05/29/2023 19:17   DG Chest Port 1 View  Result Date: 05/29/2023 CLINICAL DATA:  Altered mental status. EXAM: PORTABLE CHEST 1 VIEW COMPARISON:  May 09, 2023. FINDINGS: The heart size and mediastinal contours are within normal limits. Both lungs are clear. The visualized skeletal structures are unremarkable. IMPRESSION: No active disease.  Electronically Signed   By: Lupita Raider M.D.   On: 05/29/2023 18:11    Pertinent labs & imaging results that were available during my care of the patient were reviewed by me and considered in my medical decision making (see MDM for details).  Medications Ordered in ED Medications  lactated ringers infusion ( Intravenous New Bag/Given 05/29/23 1621)  aspirin chewable tablet 81 mg (has no administration in time range)  amLODipine (NORVASC) tablet 5 mg (has no administration in time range)  rosuvastatin (CRESTOR) tablet 20 mg (has no administration in time range)  donepezil (ARICEPT) tablet 5 mg (has no administration in time range)  FLUoxetine (PROZAC) capsule 40 mg (has no administration in time range)  memantine (NAMENDA) tablet 10 mg (has no administration in time range)  buPROPion (WELLBUTRIN XL) 24 hr tablet 150 mg (has no administration in time range)  docusate sodium (COLACE) capsule 100 mg (has no administration in time range)  omeprazole (PRILOSEC OTC) EC tablet 40 mg (has no administration in time range)  pantoprazole (PROTONIX) EC tablet 40 mg (has no administration in time range)  thiamine (VITAMIN B1) tablet 100 mg (has no administration in time range)  fluticasone (FLONASE) 50 MCG/ACT nasal spray 2 spray (has no administration in time range)  ipratropium (ATROVENT) nebulizer solution 0.5 mg (has no administration in time range)  levalbuterol (XOPENEX) nebulizer solution 0.63 mg (has no administration in time range)  montelukast (SINGULAIR) tablet 10 mg (has no administration in time range)  fluticasone furoate-vilanterol (BREO ELLIPTA) 200-25 MCG/ACT 1 puff (has no administration in time range)    And  umeclidinium bromide (INCRUSE ELLIPTA) 62.5 MCG/ACT 1 puff (has no administration in time range)  enoxaparin (LOVENOX) injection 30 mg (has no administration in time range)  cefTRIAXone (ROCEPHIN) injection 1 g (has no administration in time range)  acetaminophen (TYLENOL)  tablet 650 mg (has no administration in time range)    Or  acetaminophen (TYLENOL) suppository 650 mg (has no administration in time range)  ondansetron (ZOFRAN) tablet 4 mg (has no administration in time range)    Or  ondansetron (ZOFRAN) injection 4 mg (has no administration in time range)  lactated ringers bolus 1,000 mL (0 mLs Intravenous Stopped 05/29/23 1656)    And  lactated ringers bolus 500 mL (0 mLs Intravenous Stopped 05/29/23 1907)    And  lactated ringers bolus 250 mL (0 mLs Intravenous Stopped 05/29/23 1907)  ceFEPIme (MAXIPIME) 2 g in sodium chloride 0.9 % 100 mL IVPB (0 g Intravenous Stopped 05/29/23 1656)  metroNIDAZOLE (FLAGYL) IVPB 500 mg (0 mg Intravenous Stopped 05/29/23 1739)  vancomycin (VANCOCIN) IVPB 1000 mg/200 mL premix (0 mg Intravenous Stopped 05/29/23 1907)  iohexol (OMNIPAQUE) 350 MG/ML injection 80 mL (80 mLs Intravenous Contrast Given 05/29/23 1607)  acetaminophen (TYLENOL) tablet 1,000 mg (1,000 mg Oral Given 05/29/23 1737)                                                                                                                                     Procedures Procedures  (including critical care time)  Medical Decision Making / ED Course   MDM:  77 year old female presenting to the emergency department from nursing facility for altered mental status.  Patient mildly ill-appearing, pleasantly demented, follows commands and alert.  Vitals notable for tachycardia.  Differential includes sepsis given reported fever from nursing facility.  Code sepsis activated.  Given abdominal tenderness obtain CT scan.  Patient also has an easily reducible periumbilical hernia although mildly tender, could represent obstruction, perforation, abscess.  Could also represent urinary infection so obtain urinalysis.  Lungs clear on exam, chest x-ray on my interpretation with no clear infiltrate to suggest pneumonia. No signs of soft tissue infection or cellulitis.   Given recent hip surgery and admission, consider DVT or PE as cause, will obtain CT angiography of the chest.  Will give fluids as patient appears dehydrated.  Also will treat with antibiotics.  Anticipate patient will need to be admitted.  Patient is awake and alert, following commands and pleasantly demented, low concern for any acute CNS process.  Clinical Course as of 05/29/23 2346  Wed May 29, 2023  2343 Workup was overall unremarkable.  Patient now seems a little bit more confused.  CT head was obtained which is negative for any acute process.  Discussed with the patient's sons at bedside who reports she does not seem at her baseline mental status currently.  Her CT chest was without evidence of PE or pneumonia.  CT abdomen with no clear intra-abdominal process.  There is some signs of possible pyelonephritis to the left kidney although her urinalysis is relatively bland.  Will send a urine culture.  Given the patient's continued altered mental status, likely delirium, will admit patient to the hospitalist. [WS]    Clinical Course User Index [WS] Lonell Grandchild, MD     Additional history obtained: -Additional history obtained from family and ems -External records from outside source obtained and reviewed including: Chart review including previous notes, labs, imaging, consultation notes including prior admission    Lab Tests: -I ordered, reviewed, and interpreted labs.   The pertinent results include:   Labs Reviewed  COMPREHENSIVE METABOLIC PANEL - Abnormal; Notable for the following components:      Result Value   Glucose, Bld 101 (*)    Creatinine, Ser 1.43 (*)    Calcium 8.4 (*)    Albumin 3.1 (*)    Alkaline Phosphatase 177 (*)  GFR, Estimated 37 (*)    All other components within normal limits  CBC WITH DIFFERENTIAL/PLATELET - Abnormal; Notable for the following components:   RBC 3.24 (*)    Hemoglobin 9.7 (*)    HCT 31.3 (*)    Platelets 449 (*)    All other  components within normal limits  URINALYSIS, W/ REFLEX TO CULTURE (INFECTION SUSPECTED) - Abnormal; Notable for the following components:   Color, Urine STRAW (*)    Specific Gravity, Urine 1.031 (*)    Ketones, ur 5 (*)    All other components within normal limits  I-STAT CHEM 8, ED - Abnormal; Notable for the following components:   Creatinine, Ser 1.50 (*)    Glucose, Bld 100 (*)    Calcium, Ion 1.11 (*)    Hemoglobin 10.5 (*)    HCT 31.0 (*)    All other components within normal limits  RESP PANEL BY RT-PCR (RSV, FLU A&B, COVID)  RVPGX2  CULTURE, BLOOD (ROUTINE X 2)  CULTURE, BLOOD (ROUTINE X 2)  URINE CULTURE  PROTIME-INR  APTT  COMPREHENSIVE METABOLIC PANEL  CBC  I-STAT CG4 LACTIC ACID, ED  I-STAT CG4 LACTIC ACID, ED    Notable for no obvious UTI, normal lactate, cr at baseline   EKG   EKG Interpretation Date/Time:  Wednesday May 29 2023 15:39:15 EST Ventricular Rate:  125 PR Interval:    QRS Duration:  100 QT Interval:  321 QTC Calculation: 463 R Axis:   96  Text Interpretation: Atrial fibrillation Right axis deviation Artifact in lead(s) I II III aVL aVF V2 V4 Confirmed by Alvino Blood ([id number]) on 05/29/2023 4:16:09 PM         Imaging Studies ordered: I ordered imaging studies including CT head, chest, abdomen pelvis  On my interpretation imaging demonstrates no acute process other than possible L pyelonephritis  I independently visualized and interpreted imaging. I agree with the radiologist interpretation   Medicines ordered and prescription drug management: Meds ordered this encounter  Medications   lactated ringers infusion   AND Linked Order Group    lactated ringers bolus 1,000 mL     Order Specific Question:   Enter Patient Weight in Kilograms     Answer:   57    lactated ringers bolus 500 mL     Order Specific Question:   Enter Patient Weight in Kilograms     Answer:   57    lactated ringers bolus 250 mL     Order Specific  Question:   Enter Patient Weight in Kilograms     Answer:   57   ceFEPIme (MAXIPIME) 2 g in sodium chloride 0.9 % 100 mL IVPB    Order Specific Question:   Antibiotic Indication:    Answer:   Other Indication (list below)    Order Specific Question:   Other Indication:    Answer:   Unknown source   metroNIDAZOLE (FLAGYL) IVPB 500 mg    Order Specific Question:   Antibiotic Indication:    Answer:   Other Indication (list below)    Order Specific Question:   Other Indication:    Answer:   Unknown source   vancomycin (VANCOCIN) IVPB 1000 mg/200 mL premix    Order Specific Question:   Indication:    Answer:   Other Indication (list below)    Order Specific Question:   Other Indication:    Answer:   Unknown source   iohexol (OMNIPAQUE) 350 MG/ML injection 80 mL  acetaminophen (TYLENOL) tablet 1,000 mg   aspirin chewable tablet 81 mg   amLODipine (NORVASC) tablet 5 mg   rosuvastatin (CRESTOR) tablet 20 mg   donepezil (ARICEPT) tablet 5 mg   FLUoxetine (PROZAC) capsule 40 mg    TAKE ONE CAPSULE BY MOUTH DAILY     memantine (NAMENDA) tablet 10 mg   buPROPion (WELLBUTRIN XL) 24 hr tablet 150 mg   docusate sodium (COLACE) capsule 100 mg   omeprazole (PRILOSEC OTC) EC tablet 40 mg   pantoprazole (PROTONIX) EC tablet 40 mg   thiamine (VITAMIN B1) tablet 100 mg   fluticasone (FLONASE) 50 MCG/ACT nasal spray 2 spray   ipratropium (ATROVENT) nebulizer solution 0.5 mg   levalbuterol (XOPENEX) nebulizer solution 0.63 mg   montelukast (SINGULAIR) tablet 10 mg   AND Linked Order Group    fluticasone furoate-vilanterol (BREO ELLIPTA) 200-25 MCG/ACT 1 puff    umeclidinium bromide (INCRUSE ELLIPTA) 62.5 MCG/ACT 1 puff   enoxaparin (LOVENOX) injection 30 mg   cefTRIAXone (ROCEPHIN) injection 1 g    Order Specific Question:   Antibiotic Indication:    Answer:   Other Indication (list below)    Order Specific Question:   Other Indication:    Answer:   Pyelonephritis   OR Linked Order Group     acetaminophen (TYLENOL) tablet 650 mg    acetaminophen (TYLENOL) suppository 650 mg   OR Linked Order Group    ondansetron (ZOFRAN) tablet 4 mg    ondansetron (ZOFRAN) injection 4 mg    -I have reviewed the patients home medicines and have made adjustments as needed   Consultations Obtained: I requested consultation with the hospitalist,  and discussed lab and imaging findings as well as pertinent plan - they recommend: admission   Cardiac Monitoring: The patient was maintained on a cardiac monitor.  I personally viewed and interpreted the cardiac monitored which showed an underlying rhythm of: NSR  Reevaluation: After the interventions noted above, I reevaluated the patient and found that their symptoms have improved  Co morbidities that complicate the patient evaluation  Past Medical History:  Diagnosis Date   Allergic rhinitis    Allergic rhinitis, cause unspecified 09/21/2011   Aortic stenosis 08/23/2016   Arthritis    Asthma    Asthma 09/21/2011   Colon polyps    COPD (chronic obstructive pulmonary disease) (HCC)    mild    Coronary artery calcification 08/23/2016   Dementia (HCC)    Depression with anxiety    HTN (hypertension)    Hyperlipidemia    UTI (lower urinary tract infection)       Dispostion: Disposition decision including need for hospitalization was considered, and patient admitted to the hospital.    Final Clinical Impression(s) / ED Diagnoses Final diagnoses:  Acute metabolic encephalopathy     This chart was dictated using voice recognition software.  Despite best efforts to proofread,  errors can occur which can change the documentation meaning.    Lonell Grandchild, MD 05/29/23 [phone]

## 2023-05-29 NOTE — ED Triage Notes (Signed)
Pt BIBA from [facility] with c/o AMS xyesterday, pt usually A&Ox4, per staff. Hx COPD. Hx dementia.   20G R AC  127/77- HR 126- 95% 4L [place]- RR 26- CBG 146

## 2023-05-30 ENCOUNTER — Inpatient Hospital Stay (HOSPITAL_COMMUNITY): Payer: Medicare HMO

## 2023-05-30 DIAGNOSIS — I4892 Unspecified atrial flutter: Secondary | ICD-10-CM | POA: Diagnosis not present

## 2023-05-30 DIAGNOSIS — G9341 Metabolic encephalopathy: Secondary | ICD-10-CM | POA: Diagnosis not present

## 2023-05-30 DIAGNOSIS — I350 Nonrheumatic aortic (valve) stenosis: Secondary | ICD-10-CM | POA: Diagnosis not present

## 2023-05-30 DIAGNOSIS — N1831 Chronic kidney disease, stage 3a: Secondary | ICD-10-CM

## 2023-05-30 DIAGNOSIS — J9611 Chronic respiratory failure with hypoxia: Secondary | ICD-10-CM | POA: Diagnosis not present

## 2023-05-30 DIAGNOSIS — I4719 Other supraventricular tachycardia: Secondary | ICD-10-CM

## 2023-05-30 DIAGNOSIS — N12 Tubulo-interstitial nephritis, not specified as acute or chronic: Secondary | ICD-10-CM | POA: Diagnosis not present

## 2023-05-30 LAB — ECHOCARDIOGRAM COMPLETE
AR max vel: 0.56 cm2
AV Area VTI: 0.59 cm2
AV Area mean vel: 0.55 cm2
AV Mean grad: 14 mm[Hg]
AV Peak grad: 22.9 mm[Hg]
Ao pk vel: 2.39 m/s
S' Lateral: 2.5 cm

## 2023-05-30 LAB — COMPREHENSIVE METABOLIC PANEL
ALT: 14 U/L (ref 0–44)
AST: 25 U/L (ref 15–41)
Albumin: 2.8 g/dL — ABNORMAL LOW (ref 3.5–5.0)
Alkaline Phosphatase: 172 U/L — ABNORMAL HIGH (ref 38–126)
Anion gap: 9 (ref 5–15)
BUN: 12 mg/dL (ref 8–23)
CO2: 25 mmol/L (ref 22–32)
Calcium: 8.2 mg/dL — ABNORMAL LOW (ref 8.9–10.3)
Chloride: 103 mmol/L (ref 98–111)
Creatinine, Ser: 0.95 mg/dL (ref 0.44–1.00)
GFR, Estimated: >60 mL/min (ref >60)
Glucose, Bld: 86 mg/dL (ref 70–99)
Potassium: 3.9 mmol/L (ref 3.5–5.1)
Sodium: 137 mmol/L (ref 135–145)
Total Bilirubin: 0.4 mg/dL (ref <1.2)
Total Protein: 6.3 g/dL — ABNORMAL LOW (ref 6.5–8.1)

## 2023-05-30 LAB — BLOOD CULTURE ID PANEL (REFLEXED) - BCID2

## 2023-05-30 LAB — CBC
HCT: 30.6 % — ABNORMAL LOW (ref 36.0–46.0)
Hemoglobin: 9.2 g/dL — ABNORMAL LOW (ref 12.0–15.0)
MCH: 29.3 pg (ref 26.0–34.0)
MCHC: 30.1 g/dL (ref 30.0–36.0)
MCV: 97.5 fL (ref 80.0–100.0)
Platelets: 418 10*3/uL — ABNORMAL HIGH (ref 150–400)
RBC: 3.14 MIL/uL — ABNORMAL LOW (ref 3.87–5.11)
RDW: 15.1 % (ref 11.5–15.5)
WBC: 11.7 10*3/uL — ABNORMAL HIGH (ref 4.0–10.5)
nRBC: 0 % (ref 0.0–0.2)

## 2023-05-30 LAB — AMMONIA: Ammonia: 26 umol/L (ref 9–35)

## 2023-05-30 MED ORDER — MORPHINE SULFATE (PF) 2 MG/ML IV SOLN: 1.0000 mg | INTRAVENOUS | Status: DC | PRN

## 2023-05-30 MED ORDER — LIDOCAINE HCL (PF) 1 % IJ SOLN
5.0000 mL | Freq: Once | INTRAMUSCULAR | Status: AC
  Administered 2023-05-30: 5 mL
  Filled 2023-05-30: qty 30

## 2023-05-30 MED ORDER — METOPROLOL TARTRATE 5 MG/5ML IV SOLN
2.5000 mg | INTRAVENOUS | Status: DC | PRN
  Administered 2023-05-30: 2.5 mg via INTRAVENOUS
  Filled 2023-05-30: qty 5

## 2023-05-30 MED ORDER — MORPHINE SULFATE (PF) 2 MG/ML IV SOLN
1.0000 mg | INTRAVENOUS | Status: DC | PRN
  Administered 2023-05-30: 1 mg via INTRAVENOUS
  Filled 2023-05-30: qty 1

## 2023-05-30 MED ORDER — METOPROLOL TARTRATE 5 MG/5ML IV SOLN: 2.5000 mg | Freq: Once | INTRAVENOUS | Status: DC

## 2023-05-30 MED ORDER — ENOXAPARIN SODIUM 40 MG/0.4ML IJ SOSY
40.0000 mg | PREFILLED_SYRINGE | Freq: Every day | INTRAMUSCULAR | Status: DC
  Administered 2023-05-30 – 2023-06-02 (×4): 40 mg via SUBCUTANEOUS
  Filled 2023-05-30 (×4): qty 0.4

## 2023-05-30 NOTE — Progress Notes (Addendum)
PROGRESS NOTE    Andrea Larson  EAV:[id number] DOB: 11/22/1945 DOA: 05/29/2023 PCP: Caesar Bookman, NP   Brief Narrative:  HPI:  Andrea Larson is a 77 y.o. female with medical history significant of essential hypertension, GAD, dementia, CKD stage IIIb, COPD, vitamin B12 deficiency,  hyperlipidemia, chronic hypoxic respiratory failure 2 L oxygen at bedtime and recent femoral fracture status post hip arthroplasty 05/29/2023 presented to emergency department from nursing home due to altered mentation.  Per nursing home report patient is alert but not completely oriented for last 2 days.  As well as patient is more lethargic.  Paramedics reported patient has unknown temperature at nursing facility.  Patient is a poor historian.  She denies any fever, chill, increasing urinary  urgency, frequency and dysuria.  However she is complaining about left-sided flank pain.  Did any chest pain, shortness of breath and palpitation.   Patient has been recently hospitalized 10/30 to 11/5 for mechanical fall and fracture of the right hip status post right sided hip arthroplasty.  Patient was discharged to nursing facility afterward.   ED Course:  At presentation to ED patient's vitals are hemodynamically stable.  Blood pressure 134/9012, heart rate 118 and O2 sat 97% on 3 L. CMP unremarkable.  Sodium 136, potassium 4.6, bicarb 28, blood glucose 101, creatinine 1.43, albumin 3.1 GFR 37 anion gap 9. CBC unremarkable with stable H&H.  Normal pro time and INR.  Normal APTT.  Blood cultures are pending.  Respiratory panel negative.  Lactic acid within normal range.  UA Strow appearance, increased specific gravity and ketone positive.   CT angiogram chest no acute pulmonary embolism, acute cardiopulmonary disease. CT abdomen normal finding.  Cholelithiasis.  No evidence of acute cholecystitis.  Periumbilical ventral hernia.  No bowel obstruction.  Left-sided pyelonephritis.   Head CT scan unremarkable.    ED physician reported that EMS reported patient had fever at nursing home however there is no actual record of the fever on the chart.  Dr. Suezanne Jacquet reported that patient has left-sided CVA tenderness.  At this point unclear etiology of source of infection.  However patient is afebrile and hemodynamically stable.  Normal lactic acid.  In the ED patient has been treated with sepsis protocol and clued IV bolus 2 L and currently on LR 150 cc/h, cefepime, metronidazole and vancomycin.  Pending urine culture and blood cultures.   Hospital list has been consulted for further evaluation management of altered mentation/acute metabolic encephalopathy/delirium and left-sided pyelonephritis.  Assessment & Plan:   Active Problems:   Acute metabolic encephalopathy   Pyelonephritis of left kidney   Hyperlipidemia   COPD (chronic obstructive pulmonary disease) (HCC)   Essential hypertension   Chronic kidney disease (CKD), stage III (moderate) (HCC)   Chronic hypoxic respiratory failure (HCC)   Dementia without behavioral disturbance (HCC)   GAD (generalized anxiety disorder)  Acute metabolic encephalopathy: Could be secondary to infection or worsening dementia however currently patient is fully alert and oriented.  CT head normal.  Ammonia normal.  Left-sided pyelonephritis At the time of admission, no leukocytosis, she was afebrile and sepsis was ruled out.  Interestingly, CT abdomen is consistent with possible left pyelonephritis but UA is not consistent with UTI.  She has been started on antibiotics for that.  On my examination, she did not have any CVA tenderness.  I will continue antibiotics and follow cultures for now but low threshold to discontinue antibiotics.    New onset atrial fibrillation: After admission, patient was  found to have new onset atrial fibrillation with RVR.  Cardiology consulted.  History of right-sided hip fracture status post arthroplasty 11/24 Pain controlled on current  medications, PT OT consulted.   CKD 3A Currently creatinine better than his baseline.   Hyperlipidemia -Continue Lipitor 20 mg daily   History of COPD Chronic hypoxic respiratory failure 2/3 L oxygen at baseline At baseline.  Continue home medications.   Dementia -Aricept 5 mg daily   Generalized anxiety disorder -Continue BuSpar 150 mg daily and ProAir 40 mg daily, holding Remeron in the setting of confusion/altered mentation.   Initial hypertension Intermittently soft blood pressure, continue to hold amlodipine.   Chronic constipation -Continue Colace 100 mg twice daily  DVT prophylaxis: enoxaparin (LOVENOX) injection 40 mg Start: 05/30/23 2200 SCDs Start: 05/29/23 2345   Code Status: Limited: Do not attempt resuscitation (DNR) -DNR-LIMITED -Do Not Intubate/DNI   Family Communication:  None present at bedside.  Plan of care discussed with patient in length and he/she verbalized understanding and agreed with it.  Status is: Inpatient Remains inpatient appropriate because: Not back to baseline yet.   Estimated body mass index is 22.82 kg/m as calculated from the following:   Height as of 05/07/23: 5\' 2"  (1.575 m).   Weight as of 05/14/23: 56.6 kg.    Nutritional Assessment: There is no height or weight on file to calculate BMI.. Seen by dietician.  I agree with the assessment and plan as outlined below: Nutrition Status:        . Skin Assessment: I have examined the patient's skin and I agree with the wound assessment as performed by the wound care RN as outlined below:    Consultants:  None  Procedures:  None  Antimicrobials:  Anti-infectives (From admission, onward)    Start     Dose/Rate Route Frequency Ordered Stop   05/30/23 0000  cefTRIAXone (ROCEPHIN) injection 2 g        2 g Intramuscular Every 24 hours 05/29/23 2346 06/04/23 2359   05/29/23 1530  ceFEPIme (MAXIPIME) 2 g in sodium chloride 0.9 % 100 mL IVPB        2 g 200 mL/hr over 30 Minutes  Intravenous  Once 05/29/23 1519 05/29/23 1656   05/29/23 1530  metroNIDAZOLE (FLAGYL) IVPB 500 mg        500 mg 100 mL/hr over 60 Minutes Intravenous  Once 05/29/23 1519 05/29/23 1739   05/29/23 1530  vancomycin (VANCOCIN) IVPB 1000 mg/200 mL premix        1,000 mg 200 mL/hr over 60 Minutes Intravenous  Once 05/29/23 1519 05/29/23 1907         Subjective: Patient seen and examined.  She is now fully alert and oriented and has no complaints.  Objective: Vitals:   05/30/23 1100 05/30/23 1215 05/30/23 1312 05/30/23 1316  BP: 123/61 (!) 107/59  111/62  Pulse: (!) 57 86  (!) 107  Resp: (!) 31 (!) 29  (!) 32  Temp:    97.9 F (36.6 C)  TempSrc:    Oral  SpO2: 93% 99% 98% 100%   No intake or output data in the 24 hours ending 05/30/23 1330 There were no vitals filed for this visit.  Examination:  General exam: Appears calm and comfortable  Respiratory system: Clear to auscultation. Respiratory effort normal. Cardiovascular system: S1 & S2 heard, RRR. No JVD, murmurs, rubs, gallops or clicks. No pedal edema. Gastrointestinal system: Abdomen is nondistended, soft and nontender. No organomegaly or masses felt. Normal  bowel sounds heard. Central nervous system: Alert and oriented. No focal neurological deficits. Extremities: Symmetric 5 x 5 power. Skin: No rashes, lesions or ulcers Psychiatry: Judgement and insight appear normal. Mood & affect appropriate.    Data Reviewed: I have personally reviewed following labs and imaging studies  CBC: Recent Labs  Lab 05/29/23 1528 05/29/23 1535 05/30/23 0500  WBC 8.8  --  11.7*  NEUTROABS 7.0  --   --   HGB 9.7* 10.5* 9.2*  HCT 31.3* 31.0* 30.6*  MCV 96.6  --  97.5  PLT 449*  --  418*   Basic Metabolic Panel: Recent Labs  Lab 05/29/23 1528 05/29/23 1535 05/30/23 0500  NA 136 138 137  K 4.6 4.7 3.9  CL 99 100 103  CO2 28  --  25  GLUCOSE 101* 100* 86  BUN 20 19 12   CREATININE 1.43* 1.50* 0.95  CALCIUM 8.4*  --  8.2*    GFR: CrCl cannot be calculated (Unknown ideal weight.). Liver Function Tests: Recent Labs  Lab 05/29/23 1528 05/30/23 0500  AST 21 25  ALT 14 14  ALKPHOS 177* 172*  BILITOT 0.4 0.4  PROT 6.7 6.3*  ALBUMIN 3.1* 2.8*   No results for input(s): "LIPASE", "AMYLASE" in the last 168 hours. Recent Labs  Lab 05/30/23 0500  AMMONIA 26   Coagulation Profile: Recent Labs  Lab 05/29/23 1528  INR 1.0   Cardiac Enzymes: No results for input(s): "CKTOTAL", "CKMB", "CKMBINDEX", "TROPONINI" in the last 168 hours. BNP (last 3 results) No results for input(s): "PROBNP" in the last 8760 hours. HbA1C: No results for input(s): "HGBA1C" in the last 72 hours. CBG: No results for input(s): "GLUCAP" in the last 168 hours. Lipid Profile: No results for input(s): "CHOL", "HDL", "LDLCALC", "TRIG", "CHOLHDL", "LDLDIRECT" in the last 72 hours. Thyroid Function Tests: No results for input(s): "TSH", "T4TOTAL", "FREET4", "T3FREE", "THYROIDAB" in the last 72 hours. Anemia Panel: No results for input(s): "VITAMINB12", "FOLATE", "FERRITIN", "TIBC", "IRON", "RETICCTPCT" in the last 72 hours. Sepsis Labs: Recent Labs  Lab 05/29/23 1535  LATICACIDVEN 0.9    Recent Results (from the past 240 hour(s))  Blood Culture (routine x 2)     Status: None (Preliminary result)   Collection Time: 05/29/23  3:28 PM   Specimen: BLOOD RIGHT ARM  Result Value Ref Range Status   Specimen Description   Final    BLOOD RIGHT ARM Performed at [facility], [address] [place], [place] [address]    Special Requests   Final    BOTTLES DRAWN AEROBIC AND ANAEROBIC Blood Culture adequate volume Performed at [facility], [address], [place], [place] [address]    Culture   Final    NO GROWTH < 12 HOURS Performed at [facility], [address], [place], [place] [address]    Report Status PENDING  Incomplete  Resp panel by RT-PCR (RSV, Flu A&B, Covid) Anterior Nasal Swab      Status: None   Collection Time: 05/29/23  3:29 PM   Specimen: Anterior Nasal Swab  Result Value Ref Range Status   SARS Coronavirus 2 by RT PCR NEGATIVE NEGATIVE Final    Comment: (NOTE) SARS-CoV-2 target nucleic acids are NOT DETECTED.  The SARS-CoV-2 RNA is generally detectable in upper respiratory specimens during the acute phase of infection. The lowest concentration of SARS-CoV-2 viral copies this assay can detect is 138 copies/mL. A negative result does not preclude SARS-Cov-2 infection and should not be  used as the sole basis for treatment or other patient management decisions. A negative result may occur with  improper specimen collection/handling, submission of specimen other than nasopharyngeal swab, presence of viral mutation(s) within the areas targeted by this assay, and inadequate number of viral copies(<138 copies/mL). A negative result must be combined with clinical observations, patient history, and epidemiological information. The expected result is Negative.  Fact Sheet for Patients:  BloggerCourse.com  Fact Sheet for Healthcare Providers:  SeriousBroker.it  This test is no t yet approved or cleared by the [place] FDA and  has been authorized for detection and/or diagnosis of SARS-CoV-2 by FDA under an Emergency Use Authorization (EUA). This EUA will remain  in effect (meaning this test can be used) for the duration of the COVID-19 declaration under Section 564(b)(1) of the Act, 21 U.S.C.section 360bbb-3(b)(1), unless the authorization is terminated  or revoked sooner.       Influenza A by PCR NEGATIVE NEGATIVE Final   Influenza B by PCR NEGATIVE NEGATIVE Final    Comment: (NOTE) The Xpert Xpress SARS-CoV-2/FLU/RSV plus assay is intended as an aid in the diagnosis of influenza from Nasopharyngeal swab specimens and should not be used as a sole basis for treatment. Nasal washings and aspirates are  unacceptable for Xpert Xpress SARS-CoV-2/FLU/RSV testing.  Fact Sheet for Patients: BloggerCourse.com  Fact Sheet for Healthcare Providers: SeriousBroker.it  This test is not yet approved or cleared by the [place] FDA and has been authorized for detection and/or diagnosis of SARS-CoV-2 by FDA under an Emergency Use Authorization (EUA). This EUA will remain in effect (meaning this test can be used) for the duration of the COVID-19 declaration under Section 564(b)(1) of the Act, 21 U.S.C. section 360bbb-3(b)(1), unless the authorization is terminated or revoked.     Resp Syncytial Virus by PCR NEGATIVE NEGATIVE Final    Comment: (NOTE) Fact Sheet for Patients: BloggerCourse.com  Fact Sheet for Healthcare Providers: SeriousBroker.it  This test is not yet approved or cleared by the [place] FDA and has been authorized for detection and/or diagnosis of SARS-CoV-2 by FDA under an Emergency Use Authorization (EUA). This EUA will remain in effect (meaning this test can be used) for the duration of the COVID-19 declaration under Section 564(b)(1) of the Act, 21 U.S.C. section 360bbb-3(b)(1), unless the authorization is terminated or revoked.  Performed at [facility], [address], [place], [place] [address]   Blood Culture (routine x 2)     Status: None (Preliminary result)   Collection Time: 05/29/23  3:43 PM   Specimen: BLOOD  Result Value Ref Range Status   Specimen Description   Final    BLOOD BLOOD LEFT HAND Performed at [facility], [address] [place], [place] [address]    Special Requests   Final    BOTTLES DRAWN AEROBIC AND ANAEROBIC Blood Culture adequate volume Performed at [facility], [address] [place], [place] [address]    Culture   Final    NO GROWTH < 12 HOURS Performed at [facility], [address], [place], [place] [address]    Report Status PENDING  Incomplete     Radiology Studies: CT Head Wo Contrast  Result Date: 05/29/2023 CLINICAL DATA:  Altered mental status. EXAM: CT HEAD WITHOUT CONTRAST TECHNIQUE: Contiguous axial images were obtained from the base of the skull through the vertex without intravenous contrast. RADIATION DOSE REDUCTION: This exam was performed according to the departmental dose-optimization  program which includes automated exposure control, adjustment of the mA and/or kV according to patient size and/or use of iterative reconstruction technique. COMPARISON:  Head CT dated 04/07/2021. FINDINGS: Brain: Mild age-related atrophy and chronic microvascular ischemic changes. There is no acute intracranial hemorrhage. No mass effect or midline shift. No extra-axial fluid collection. Vascular: No hyperdense vessel or unexpected calcification. Skull: Normal. Negative for fracture or focal lesion. Sinuses/Orbits: Mild mucoperiosteal thickening of paranasal sinuses. The mastoid air cells are clear. Other: None IMPRESSION: 1. No acute intracranial pathology. 2. Mild age-related atrophy and chronic microvascular ischemic changes. Electronically Signed   By: Elgie Collard M.D.   On: 05/29/2023 23:36   CT ABDOMEN PELVIS W CONTRAST  Addendum Date: 05/29/2023   ADDENDUM REPORT: 05/29/2023 19:21 ADDENDUM: After further review and comparison to today's chest CT, there are questionable areas of decreased perfusion in the left kidney, mostly upper and lower poles, but there is extensive respiratory motion which limits evaluation. Recommend clinical correlation for possible pyelonephritis. Electronically Signed   By: Charlett Nose M.D.   On: 05/29/2023 19:21   Result Date: 05/29/2023 CLINICAL DATA:  Abdominal pain, acute, nonlocalized EXAM: CT ABDOMEN AND PELVIS WITH CONTRAST TECHNIQUE: Multidetector CT imaging of the abdomen and pelvis was performed using the  standard protocol following bolus administration of intravenous contrast. RADIATION DOSE REDUCTION: This exam was performed according to the departmental dose-optimization program which includes automated exposure control, adjustment of the mA and/or kV according to patient size and/or use of iterative reconstruction technique. CONTRAST:  80mL OMNIPAQUE IOHEXOL 350 MG/ML SOLN COMPARISON:  03/27/2019 FINDINGS: Lower chest: No acute abnormality. Coronary artery disease, aortic atherosclerosis. Hepatobiliary: Small layering gallstones within the gallbladder. No focal hepatic abnormality or biliary ductal dilatation. Pancreas: No focal abnormality or ductal dilatation. Spleen: No focal abnormality.  Normal size. Adrenals/Urinary Tract: No adrenal abnormality. No focal renal abnormality. No stones or hydronephrosis. Urinary bladder decompressed. Stomach/Bowel: Sigmoid diverticulosis. No active diverticulitis. Stomach and small bowel decompressed, unremarkable. Vascular/Lymphatic: Diffuse aortoiliac atherosclerosis. No evidence of aneurysm or adenopathy. Reproductive: Uterus and adnexa unremarkable.  No mass. Other: No free fluid or free air. Periumbilical hernias noted containing fat on the left and fat and a portion of the anterior wall of the transverse colon on the right. No bowel obstruction. Musculoskeletal: No acute bony abnormality. IMPRESSION: No acute findings in the abdomen or pelvis. Diffuse aortoiliac atherosclerosis, coronary artery disease. Cholelithiasis.  No evidence of acute cholecystitis. Periumbilical ventral hernias containing fat and anterior wall of the transverse colon. No bowel obstruction. Electronically Signed: By: Charlett Nose M.D. On: 05/29/2023 19:13   CT Angio Chest PE W and/or Wo Contrast  Result Date: 05/29/2023 CLINICAL DATA:  Pulmonary embolism (PE) suspected, high prob. Altered mental status. EXAM: CT ANGIOGRAPHY CHEST WITH CONTRAST TECHNIQUE: Multidetector CT imaging of the chest  was performed using the standard protocol during bolus administration of intravenous contrast. Multiplanar CT image reconstructions and MIPs were obtained to evaluate the vascular anatomy. RADIATION DOSE REDUCTION: This exam was performed according to the departmental dose-optimization program which includes automated exposure control, adjustment of the mA and/or kV according to patient size and/or use of iterative reconstruction technique. CONTRAST:  80mL OMNIPAQUE IOHEXOL 350 MG/ML SOLN COMPARISON:  01/08/2023 FINDINGS: Cardiovascular: No filling defects in the pulmonary arteries to suggest pulmonary emboli. Heart is normal size. Aorta is normal caliber. Coronary artery and aortic atherosclerosis. Mediastinum/Nodes: No mediastinal, hilar, or axillary adenopathy. Trachea and esophagus are unremarkable. Thyroid unremarkable. Lungs/Pleura: No confluent opacities or effusions. Upper  Abdomen: No acute findings Musculoskeletal: No acute bony abnormality. Numerous moderate to severe compression fractures in the midthoracic spine, unchanged since prior study. Review of the MIP images confirms the above findings. IMPRESSION: No evidence of pulmonary embolus. No acute cardiopulmonary disease. Coronary artery disease. Aortic Atherosclerosis (ICD10-I70.0). Electronically Signed   By: Charlett Nose M.D.   On: 05/29/2023 19:17   DG Chest Port 1 View  Result Date: 05/29/2023 CLINICAL DATA:  Altered mental status. EXAM: PORTABLE CHEST 1 VIEW COMPARISON:  May 09, 2023. FINDINGS: The heart size and mediastinal contours are within normal limits. Both lungs are clear. The visualized skeletal structures are unremarkable. IMPRESSION: No active disease. Electronically Signed   By: Lupita Raider M.D.   On: 05/29/2023 18:11    Scheduled Meds:  aspirin  81 mg Oral BID WC   buPROPion  150 mg Oral Daily   cefTRIAXone (ROCEPHIN) IM  2 g Intramuscular Q24H   docusate sodium  100 mg Oral BID   donepezil  5 mg Oral QHS    enoxaparin (LOVENOX) injection  40 mg Subcutaneous QHS   FLUoxetine  40 mg Oral Daily   fluticasone  2 spray Each Nare Daily   fluticasone furoate-vilanterol  1 puff Inhalation Daily   And   umeclidinium bromide  1 puff Inhalation Daily   ipratropium  0.5 mg Nebulization TID   memantine  10 mg Oral BID   montelukast  10 mg Oral Daily   pantoprazole  40 mg Oral Daily   rosuvastatin  20 mg Oral Daily   thiamine  100 mg Oral Daily   Continuous Infusions:   LOS: 1 day   Hughie Closs, MD [facility]  05/30/2023, 1:30 PM   *Please note that this is a verbal dictation therefore any spelling or grammatical errors are due to the "Dragon Medical One" system interpretation.  Please page via Amion and do not message via secure chat for urgent patient care matters. Secure chat can be used for non urgent patient care matters.  How to contact the [facility] Attending or Consulting provider 7A - 7P or covering provider during after hours 7P -7A, for this patient?  Check the care team in [facility] and look for a) attending/consulting TR[facility] provider listed and b) the [facility] team listed. Page or secure chat 7A-7P. Log into [email or web address] and use [place]'s universal password to access. If you do not have the password, please contact the hospital operator. Locate the [facility] provider you are looking for under [facility] and page to a number that you can be directly reached. If you still have difficulty reaching the provider, please page the [facility] (Director on Call) for the Hospitalists listed on amion for assistance.

## 2023-05-30 NOTE — Progress Notes (Signed)
OT Cancellation Note  Patient Details Name: Andrea Larson MRN: [id number] DOB: Nov 13, 1945   Cancelled Treatment:    Reason Eval/Treat Not Completed: Other (comment). Orders received, chart reviewed. Pt admitted from ED to floor - pt just arriving to floor with nursing staff completing care. Will return as able.  Mariama Saintvil L. Charleene Callegari, OTR/L  05/30/23, 4:19 PM

## 2023-05-30 NOTE — Consult Note (Addendum)
Cardiology Consultation   Patient ID: Andrea Larson MRN: [id number]; DOB: 01-10-1946  Admit date: 05/29/2023 Date of Consult: 05/30/2023  PCP:  Andrea Bookman, Larson    HeartCare Providers Cardiologist:  Andrea Si, MD     Patient Profile:   Andrea Larson is a 77 y.o. female with a history of coronary artery calcifications noted on prior CT, mild aortic stenosis, remote syncope, hypertension, hyperlipidemia, COPD with chronic hypoxic respiratory on 2-3L of O2 at home, depression/anxiety, and dementia who is being seen 05/30/2023 for the evaluation of atrial fibrillation/flutter at the request of Andrea Larson.  History of Present Illness:   Andrea Larson is a 77 year old female with the above history who is followed by Andrea Larson.  She has a history of coronary artery calcifications noted on prior CT.  Myoview in 08/2016 was low risk with no evidence of ischemia.  She was last seen by Andrea Larson in 2019 at which time she was doing well from a cardiac standpoint.  Last echo in 11/2019 showed LVEF of 70-75% with normal wall motion and grade 1 diastolic dysfunction, normal RV size and function, and mild aortic stenosis/regurgitation (mean gradient 17 mmHg).  She was recently admitted from 05/07/2023 to 05/14/2023 for a right femoral neck fracture and right superior inferior pubic rami fracture fracture after mechanical fall.  She underwent a right Hemi hip arthroplasty on 05/09/2023.  Conservative management was recommended for pubic rami fractures.  She was discharged to a SNF.  She presented back to the [facility], ED on 05/29/2023 from [facility] for further evaluation of altered mental status.  On arrival to the ED, patient mildly tachycardic.  EKG showed sinus tachycardia with frequent PACs vs atrial fibrillation, rate 125 bpm, with a lot of baseline artifact. WBC 8.8, Hgb 9.7, Plts 449. Na 136, K 4.6, Glucose 101, Cr 1.43, BUN 20, Albumin 3.1, AST 21, ALT 14, Alk Phos  177. Lactic acid normal. Respiratory panel negative for COVID, Influenza, and RSV. Urinalysis showed high specific gravity of 1.031 and ketones but no nitrates, leukocytes, WBC, or bacteria. Blood cultures and urine cultures pending. Chest x-ray showed no acute findings. Chest CTA was negative for PE. CT of abdomen/ pelvis showed no acute findings per report but there was concerned for left sided pyelonephritis based on H&P. Head CT showed no acute findings. She was started on IV fluids and IV antibiotics given concern for pyelonephritis. Repeat EKG this morning was done due to persistent tachycardia and there was concern for atrial flutter with 2:1 AV blocker. Therefore, Cardiology was consulted for further evaluation.  At the time of this evaluation, patient is resting comfortably in no acute distress. She has baseline dementia and came in with altered mental status. She was sleeping when I walked in the room. She would wake up to answer questions but it was difficult to understand her (looks like she does not having her top dentures in and I do not see them in the room). No other signs of stroke. She also would quickly falls back asleep in between questions. Therefore, history is limited. She denies any chest pain, shortness of breath, or palpitations at this time. Per review of chart, nursing home reported patient had more more lethartic and disoriented over the last 2 days. EMS also reported patient had a fever a the nursing home.  Reviewed EKGs and telemetry. There is a lot of underlying artifact on EKGs but multiple P waves can be seen. Likewise on telemetry, there  are some times when it is difficult to appreciate P waves indicating possible atrial fibrillation but for the most part P waves can be seen. Both EKG and telemetry show a narrow complex tachycardia that is sometimes irregular - it looks more like sinus tachycardia with PACs to me. However, I will review with MD.  Past Medical History:   Diagnosis Date   Allergic rhinitis    Allergic rhinitis, cause unspecified 09/21/2011   Aortic stenosis 08/23/2016   Arthritis    Asthma    Asthma 09/21/2011   Colon polyps    COPD (chronic obstructive pulmonary disease) (HCC)    mild    Coronary artery calcification 08/23/2016   Dementia (HCC)    Depression with anxiety    HTN (hypertension)    Hyperlipidemia    UTI (lower urinary tract infection)     Past Surgical History:  Procedure Laterality Date   DENTAL SURGERY     pilonidial cyst     TONSILLECTOMY     TOTAL HIP ARTHROPLASTY Right 05/09/2023   Procedure: HEMI HIP ARTHROPLASTY ANTERIOR APPROACH;  Surgeon: Andrea Frederic, MD;  Location: [facility];  Service: Orthopedics;  Laterality: Right;     Home Medications:  Prior to Admission medications   Medication Sig Start Date End Date Taking? Authorizing Provider  acetaminophen (TYLENOL) 325 MG tablet Take 650 mg by mouth every 6 (six) hours as needed for mild pain or headache.   Yes [provider]  amLODipine (NORVASC) 5 MG tablet TAKE ONE TABLET BY MOUTH DAILY Patient taking differently: Take 5 mg by mouth in the morning. 10/29/22  Yes Andrea Larson, Andrea Larson  aspirin (ASPIRIN CHILDRENS) 81 MG chewable tablet Chew 1 tablet (81 mg total) by mouth 2 (two) times daily with a meal. 05/10/23 06/24/23 Yes Hill, Andrea Larson  buPROPion (WELLBUTRIN XL) 150 MG 24 hr tablet TAKE ONE TABLET BY MOUTH DAILY Patient taking differently: Take 150 mg by mouth in the morning. 10/29/22  Yes Andrea Larson, Andrea Larson  clonazePAM (KLONOPIN) 0.5 MG tablet Take 1 tablet (0.5 mg total) by mouth 2 (two) times daily as needed for up to 7 days for anxiety. Patient taking differently: Take 0.5 mg by mouth once. 05/14/23 05/29/23 Yes Andrea Carrow, MD  docusate sodium (COLACE) 100 MG capsule Take 1 capsule (100 mg total) by mouth 2 (two) times daily. 05/14/23  Yes Andrea Larson, Andrea Safe, MD  donepezil (ARICEPT) 5 MG tablet TAKE ONE TABLET BY MOUTH AT BEDTIME Patient  taking differently: Take 5 mg by mouth at bedtime. 10/29/22  Yes Andrea Larson, Andrea Larson  FLUoxetine (PROZAC) 40 MG capsule TAKE ONE CAPSULE BY MOUTH DAILY Patient taking differently: Take 40 mg by mouth in the morning. 07/30/22  Yes Andrea Larson, Andrea Larson  fluticasone (FLONASE) 50 MCG/ACT nasal spray USE TWO SPRAYS in each nostril ONCE DAILY. Patient taking differently: Place 2 sprays into both nostrils in the morning. 08/14/21  Yes Corwin Levins, MD  folic acid (FOLVITE) 1 MG tablet TAKE ONE TABLET BY MOUTH DAILY Patient taking differently: Take 1 mg by mouth in the morning. 07/30/22  Yes Andrea Larson, Andrea Larson  HYDROcodone-acetaminophen (NORCO/VICODIN) 5-325 MG tablet Take 1 tablet by mouth every 4 (four) hours as needed for moderate pain (pain score 4-6).   Yes [provider]  ipratropium (ATROVENT) 0.02 % nebulizer solution NEBULIZE CONTENTS OF 1 VIAL 3 TIMES A DAY Patient taking differently: Take 0.5 mg by nebulization 3 (three) times daily. 04/16/22  Yes Andrea Larson, Andrea C,  Larson  levalbuterol (XOPENEX) 0.63 MG/3ML nebulizer solution USE ONE vial via nebulization every SIX hours as needed FOR SHORTNESS OF BREATH OR wheezing. Patient taking differently: Take 0.63 mg by nebulization every 6 (six) hours as needed for shortness of breath or wheezing. 06/23/21  Yes Andrea Larson, Andrea Larson  melatonin 3 MG TABS tablet Take 3 mg by mouth at bedtime.   Yes [provider]  memantine (NAMENDA) 10 MG tablet TAKE ONE TABLET BY MOUTH TWICE DAILY Patient taking differently: Take 10 mg by mouth in the morning and at bedtime. 07/30/22  Yes Andrea Larson, Andrea Larson  mirtazapine (REMERON) 15 MG tablet TAKE ONE TABLET BY MOUTH AT BEDTIME 07/30/22  Yes Andrea Larson, Andrea Larson  montelukast (SINGULAIR) 10 MG tablet TAKE ONE TABLET BY MOUTH DAILY Patient taking differently: Take 10 mg by mouth in the morning. 10/29/22  Yes Andrea Larson, Andrea Larson  Multiple Vitamin (MULTIVITAMIN WITH MINERALS) TABS tablet Take 1 tablet by mouth  daily. 04/12/21  Yes Rodolph Bong, MD  Nutritional Supplements [facility] ACTIVE) LIQD Take 1 Can by mouth daily.   Yes [provider]  omeprazole (PRILOSEC OTC) 20 MG tablet Take 40 mg by mouth daily.   Yes [provider]  polyethylene glycol (MIRALAX / GLYCOLAX) 17 g packet Take 17 g by mouth daily as needed for mild constipation. 05/14/23  Yes Andrea Larson, Andrea Safe, MD  rosuvastatin (CRESTOR) 20 MG tablet TAKE ONE TABLET BY MOUTH DAILY Patient taking differently: Take 20 mg by mouth at bedtime. 10/29/22  Yes Andrea Larson, Andrea Larson  thiamine 100 MG tablet Take 1 tablet (100 mg total) by mouth daily. 04/17/21  Yes Andrea Larson, Andrea Larson  TRELEGY ELLIPTA 200-62.5-25 MCG/ACT AEPB INHALE 1 PUFF INTO THE LUNGS ONCE DAILY AS DIRECTED. Patient taking differently: Inhale 1 puff into the lungs in the morning. 08/30/22  Yes Andrea Larson, Andrea Larson  vitamin B-12 (CYANOCOBALAMIN) 1000 MCG tablet Take 1 tablet (1,000 mcg total) by mouth daily. 04/11/21  Yes Rodolph Bong, MD  Nutritional Supplements (ENSURE ACTIVE HIGH PROTEIN) LIQD Take 1 Can by mouth daily. Patient not taking: Reported on 05/29/2023 01/11/23   Andrea Larson, Andrea Larson  omeprazole (PRILOSEC) 20 MG capsule Take 2 capsules (40 mg total) by mouth daily as needed. Patient not taking: Reported on 05/29/2023 10/31/22   Andrea Larson, Donalee Citrin, Larson  Respiratory Therapy Supplies (FLUTTER) DEVI Use as directed 08/19/17   Andrea Greathouse, MD    Inpatient Medications: Scheduled Meds:  aspirin  81 mg Oral BID WC   buPROPion  150 mg Oral Daily   cefTRIAXone (ROCEPHIN) IM  2 g Intramuscular Q24H   docusate sodium  100 mg Oral BID   donepezil  5 mg Oral QHS   enoxaparin (LOVENOX) injection  40 mg Subcutaneous QHS   FLUoxetine  40 mg Oral Daily   fluticasone  2 spray Each Nare Daily   fluticasone furoate-vilanterol  1 puff Inhalation Daily   And   umeclidinium bromide  1 puff Inhalation Daily   ipratropium  0.5 mg Nebulization TID   memantine  10 mg  Oral BID   montelukast  10 mg Oral Daily   pantoprazole  40 mg Oral Daily   rosuvastatin  20 mg Oral Daily   thiamine  100 mg Oral Daily   Continuous Infusions:  lactated ringers 125 mL/hr at 05/30/23 0510   PRN Meds: acetaminophen **OR** acetaminophen, levalbuterol, metoprolol tartrate, morphine injection, ondansetron **OR** ondansetron (ZOFRAN) IV  Allergies:  Allergies  Allergen Reactions   Lipitor [Atorvastatin] Other (See Comments)    Myalgias    Social History:   Social History   Socioeconomic History   Marital status: Divorced    Spouse name: Not on file   Number of children: 3   Years of education: two years of college   Highest education level: Not on file  Occupational History   Occupation: Retired  Tobacco Use   Smoking status: Former    Current packs/day: 0.00    Average packs/day: 0.8 packs/day for 55.0 years (41.3 ttl pk-yrs)    Types: Cigarettes    Start date: 04/17/1960    Quit date: 04/18/2015    Years since quitting: 8.1   Smokeless tobacco: Never  Vaping Use   Vaping status: Never Used  Substance and Sexual Activity   Alcohol use: Not Currently    Comment: wine couple times each week - daily at times   Drug use: Never   Sexual activity: Not on file  Other Topics Concern   Not on file  Social History Narrative   Lives with a friend.   Right-handed.   Caffeine use: one soda per day.   Social Determinants of Health   Financial Resource Strain: Not on file  Food Insecurity: No Food Insecurity (05/08/2023)   Hunger Vital Sign    Worried About Running Out of Food in the Last Year: Never true    Ran Out of Food in the Last Year: Never true  Transportation Needs: No Transportation Needs (05/08/2023)   PRAPARE - Administrator, Civil Service (Medical): No    Lack of Transportation (Non-Medical): No  Physical Activity: Not on file  Stress: Not on file  Social Connections: Not on file  Intimate Partner Violence: Not At Risk  (05/08/2023)   Humiliation, Afraid, Rape, and Kick questionnaire    Fear of Current or Ex-Partner: No    Emotionally Abused: No    Physically Abused: No    Sexually Abused: No    Family History:   Family History  Problem Relation Age of Onset   Alcohol abuse Other    Arthritis Other    Heart disease Other    Stroke Other    Hypertension Other    Hypertension Father    Stroke Father    Atrial fibrillation Brother    Other Mother        unsure of medical history   Colon cancer Neg Hx      ROS:  Please see the history of present illness.  Review of Systems  Unable to perform ROS: Mental status change   Physical Exam/Data:   Vitals:   05/29/23 2100 05/30/23 0050 05/30/23 0200 05/30/23 0500  BP: (!) 126/98  95/72 (!) 108/96  Pulse: 69  (!) 107 (!) 129  Resp: 16 16 16 18   Temp: 98.4 F (36.9 C) (!) 97.3 F (36.3 C)  98.2 F (36.8 C)  TempSrc:  Oral  Oral  SpO2: 98%  90% 93%   No intake or output data in the 24 hours ending 05/30/23 0730    05/14/2023    5:00 AM 05/13/2023    5:00 AM 05/07/2023    6:47 PM  Last 3 Weights  Weight (lbs) 124 lb 12.5 oz 121 lb 11.1 oz 116 lb 12.8 oz  Weight (kg) 56.6 kg 55.2 kg 52.98 kg     There is no height or weight on file to calculate BMI.  General: 77 y.o. thin  Caucasian female resting comfortably in no acute distress. On 5L of O2 via nasal cannula. HEENT: Normocephalic and atraumatic. Sclera clear.  Neck: Supple. No JVD. Heart: Mildly tachycardic. Difficult to appreciate heart sounds over upper airway noises.   Lungs: No increased work of breathing. Some upper airway noises appreciated but no significant wheezes, rhonchi, or rales.  Abdomen: Soft, non-distended, and non-tender to palpation.  Extremities: No lower extremity edema.    Skin: Warm and dry. Neuro: Baseline dementia. Difficult to understand patient'Larson speech at times. Psych: Normal affect. Baseline dementia.   EKG:  The EKG was personally reviewed and demonstrates:  Sinus tachycardia with frequent PACs vs atrial fibrillation/ flutter, rate 125 bpm, with a lot of baseline artifact.  Repeat EKG on 05/30/2023 Telemetry:  Telemetry was personally reviewed and demonstrates:  Narrow complex tachycardia with rates in the 100s to 110s. At times, P waves are hard to visualize indicating possible atrial fibrillation. However, for the most part looks like sinus tachycardia with PACs.   Relevant CV Studies:  Myoview 08/30/2016: The left ventricular ejection fraction is normal (55-65%). Nuclear stress EF: 64%. There was no ST segment deviation noted during stress. The study is normal. This is a low risk study. _______________  Echocardiogram 11/27/2019: Impressions: 1. Vigorous LV systolic function; grade 1 diastolic dysfunction;  calcified aortic valve with mild AS (mean gradient 17 mmHg) and mild AI.   2. Left ventricular ejection fraction, by estimation, is 70 to 75%. The  left ventricle has hyperdynamic function. The left ventricle has no  regional wall motion abnormalities. Left ventricular diastolic parameters  are consistent with Grade I diastolic  dysfunction (impaired relaxation).   3. Right ventricular systolic function is normal. The right ventricular  size is normal.   4. The mitral valve is normal in structure. No evidence of mitral valve  regurgitation. No evidence of mitral stenosis.   5. The aortic valve has an indeterminant number of cusps. Aortic valve  regurgitation is mild. Mild aortic valve stenosis.   6. The inferior vena cava is normal in size with greater than 50%  respiratory variability, suggesting right atrial pressure of 3 mmHg.   Laboratory Data:  High Sensitivity Troponin:  No results for input(Larson): "TROPONINIHS" in the last 720 hours.   Chemistry Recent Labs  Lab 05/29/23 1528 05/29/23 1535 05/30/23 0500  NA 136 138 137  K 4.6 4.7 3.9  CL 99 100 103  CO2 28  --  25  GLUCOSE 101* 100* 86  BUN 20 19 12   CREATININE 1.43*  1.50* 0.95  CALCIUM 8.4*  --  8.2*  GFRNONAA 37*  --  >60  ANIONGAP 9  --  9    Recent Labs  Lab 05/29/23 1528 05/30/23 0500  PROT 6.7 6.3*  ALBUMIN 3.1* 2.8*  AST 21 25  ALT 14 14  ALKPHOS 177* 172*  BILITOT 0.4 0.4   Lipids No results for input(Larson): "CHOL", "TRIG", "HDL", "LABVLDL", "LDLCALC", "CHOLHDL" in the last 168 hours.  Hematology Recent Labs  Lab 05/29/23 1528 05/29/23 1535 05/30/23 0500  WBC 8.8  --  11.7*  RBC 3.24*  --  3.14*  HGB 9.7* 10.5* 9.2*  HCT 31.3* 31.0* 30.6*  MCV 96.6  --  97.5  MCH 29.9  --  29.3  MCHC 31.0  --  30.1  RDW 14.9  --  15.1  PLT 449*  --  418*   Thyroid No results for input(Larson): "TSH", "FREET4" in the last 168 hours.  BNPNo results  for input(Larson): "BNP", "PROBNP" in the last 168 hours.  DDimer No results for input(Larson): "DDIMER" in the last 168 hours.   Radiology/Studies:  CT Head Wo Contrast  Result Date: 05/29/2023 CLINICAL DATA:  Altered mental status. EXAM: CT HEAD WITHOUT CONTRAST TECHNIQUE: Contiguous axial images were obtained from the base of the skull through the vertex without intravenous contrast. RADIATION DOSE REDUCTION: This exam was performed according to the departmental dose-optimization program which includes automated exposure control, adjustment of the mA and/or kV according to patient size and/or use of iterative reconstruction technique. COMPARISON:  Head CT dated 04/07/2021. FINDINGS: Brain: Mild age-related atrophy and chronic microvascular ischemic changes. There is no acute intracranial hemorrhage. No mass effect or midline shift. No extra-axial fluid collection. Vascular: No hyperdense vessel or unexpected calcification. Skull: Normal. Negative for fracture or focal lesion. Sinuses/Orbits: Mild mucoperiosteal thickening of paranasal sinuses. The mastoid air cells are clear. Other: None IMPRESSION: 1. No acute intracranial pathology. 2. Mild age-related atrophy and chronic microvascular ischemic changes.  Electronically Signed   By: Elgie Collard M.D.   On: 05/29/2023 23:36   CT ABDOMEN PELVIS W CONTRAST  Addendum Date: 05/29/2023   ADDENDUM REPORT: 05/29/2023 19:21 ADDENDUM: After further review and comparison to today'Larson chest CT, there are questionable areas of decreased perfusion in the left kidney, mostly upper and lower poles, but there is extensive respiratory motion which limits evaluation. Recommend clinical correlation for possible pyelonephritis. Electronically Signed   By: Charlett Nose M.D.   On: 05/29/2023 19:21   Result Date: 05/29/2023 CLINICAL DATA:  Abdominal pain, acute, nonlocalized EXAM: CT ABDOMEN AND PELVIS WITH CONTRAST TECHNIQUE: Multidetector CT imaging of the abdomen and pelvis was performed using the standard protocol following bolus administration of intravenous contrast. RADIATION DOSE REDUCTION: This exam was performed according to the departmental dose-optimization program which includes automated exposure control, adjustment of the mA and/or kV according to patient size and/or use of iterative reconstruction technique. CONTRAST:  80mL OMNIPAQUE IOHEXOL 350 MG/ML SOLN COMPARISON:  03/27/2019 FINDINGS: Lower chest: No acute abnormality. Coronary artery disease, aortic atherosclerosis. Hepatobiliary: Small layering gallstones within the gallbladder. No focal hepatic abnormality or biliary ductal dilatation. Pancreas: No focal abnormality or ductal dilatation. Spleen: No focal abnormality.  Normal size. Adrenals/Urinary Tract: No adrenal abnormality. No focal renal abnormality. No stones or hydronephrosis. Urinary bladder decompressed. Stomach/Bowel: Sigmoid diverticulosis. No active diverticulitis. Stomach and small bowel decompressed, unremarkable. Vascular/Lymphatic: Diffuse aortoiliac atherosclerosis. No evidence of aneurysm or adenopathy. Reproductive: Uterus and adnexa unremarkable.  No mass. Other: No free fluid or free air. Periumbilical hernias noted containing fat on  the left and fat and a portion of the anterior wall of the transverse colon on the right. No bowel obstruction. Musculoskeletal: No acute bony abnormality. IMPRESSION: No acute findings in the abdomen or pelvis. Diffuse aortoiliac atherosclerosis, coronary artery disease. Cholelithiasis.  No evidence of acute cholecystitis. Periumbilical ventral hernias containing fat and anterior wall of the transverse colon. No bowel obstruction. Electronically Signed: By: Charlett Nose M.D. On: 05/29/2023 19:13   CT Angio Chest PE W and/or Wo Contrast  Result Date: 05/29/2023 CLINICAL DATA:  Pulmonary embolism (PE) suspected, high prob. Altered mental status. EXAM: CT ANGIOGRAPHY CHEST WITH CONTRAST TECHNIQUE: Multidetector CT imaging of the chest was performed using the standard protocol during bolus administration of intravenous contrast. Multiplanar CT image reconstructions and MIPs were obtained to evaluate the vascular anatomy. RADIATION DOSE REDUCTION: This exam was performed according to the departmental dose-optimization program which includes automated exposure control,  adjustment of the mA and/or kV according to patient size and/or use of iterative reconstruction technique. CONTRAST:  80mL OMNIPAQUE IOHEXOL 350 MG/ML SOLN COMPARISON:  01/08/2023 FINDINGS: Cardiovascular: No filling defects in the pulmonary arteries to suggest pulmonary emboli. Heart is normal size. Aorta is normal caliber. Coronary artery and aortic atherosclerosis. Mediastinum/Nodes: No mediastinal, hilar, or axillary adenopathy. Trachea and esophagus are unremarkable. Thyroid unremarkable. Lungs/Pleura: No confluent opacities or effusions. Upper Abdomen: No acute findings Musculoskeletal: No acute bony abnormality. Numerous moderate to severe compression fractures in the midthoracic spine, unchanged since prior study. Review of the MIP images confirms the above findings. IMPRESSION: No evidence of pulmonary embolus. No acute cardiopulmonary  disease. Coronary artery disease. Aortic Atherosclerosis (ICD10-I70.0). Electronically Signed   By: Charlett Nose M.D.   On: 05/29/2023 19:17   DG Chest Port 1 View  Result Date: 05/29/2023 CLINICAL DATA:  Altered mental status. EXAM: PORTABLE CHEST 1 VIEW COMPARISON:  May 09, 2023. FINDINGS: The heart size and mediastinal contours are within normal limits. Both lungs are clear. The visualized skeletal structures are unremarkable. IMPRESSION: No active disease. Electronically Signed   By: Lupita Raider M.D.   On: 05/29/2023 18:11     Assessment and Plan:   Possible Atrial Fibrillation/ Flutter Patient presented with altered mental status due to possible left sided pyelonephritis. She was noted to be tachycardic and there was concern for atrial fibrillation/ flutter on EKG.  Personally reviewed EKGs and telemetry. There is a lot of underlying artifact on EKGs but multiple P waves can be seen. Likewise on telemetry, there are some times when it is difficult to appreciate P waves indicating possible atrial fibrillation but for the most part P waves can be seen. Both EKG and telemetry show a narrow complex tachycardia that is sometimes irregular - it looks more like sinus tachycardia with PACs most of the time. - Rate currently in the 100s to 110s.  - Potassium 3.9. Will check Magnesium and TSH.  - Echo pending.  - CHA2DS2-VASc = 5 (CAD, HTN, age x2, gender). Will review EKG and telemetry with MD. Will hold off on starting anticoagulation right now until can confirm rhythm with MD. If she is indeed having some paroxysmal atrial fibrillation/ flutter, will need to discuss whether she is a good candidate for long-term anticoagulation given underlying dementia and recent fall resulting in a hip fracture. She is only mildly tachycardic at this time and this may be due to underlying infection. OK to continue to use PRN Lopressor for heart rates >120 for now.   Coronary Artery Calcifications Noted on  prior CT scans. Myoview in 08/2016 was low risk with no evidence of ischemia. - No chest pain.  - Continue aspirin and statin.  Mild Aortic Stenosis/ Insufficiency Noted on Echo in 11/2019. - Repeat Echo pending.  Hypertension BP soft but stable. - Agree with holding home Amlodipine for now.  Hyperlipidemia - Continue Crestor 20mg  daily.  CKD Stage III Creatinine 1.43 >> 1.50 on admission. Baseline around 1.3 to 1.5. - Creatinine 0.95 this morning after IV fluids. - Continue to monitor.   Otherwise, per primary team: - Altered mental status - Left sided pyelonephritis - COPD - Recent hip fracture Larson/p right hip hemiarthroplasty on 10/31 - Anxiety - Dementia - Chronic constipation   Risk Assessment/Risk Scores:     For questions or updates, please contact [facility] Please consult [email or web address] for contact info under    Signed, Corrin Parker, Larson  05/30/2023 7:30  AM

## 2023-05-30 NOTE — ED Notes (Signed)
ED TO INPATIENT HANDOFF REPORT  Name/Age/Gender Andrea Larson 77 y.o. female  Code Status    Code Status Orders  (From admission, onward)           Start     Ordered   05/29/23 2346  Do not attempt resuscitation (DNR)- Limited -Do Not Intubate (DNI)  Continuous       Question Answer Comment  If pulseless and not breathing No CPR or chest compressions.   In Pre-Arrest Conditions (Patient Is Breathing and Has A Pulse) Do not intubate. Provide all appropriate non-invasive medical interventions. Avoid ICU transfer unless indicated or required.   Consent: Discussion documented in EHR or advanced directives reviewed      05/29/23 2346           Code Status History     Date Active Date Inactive Code Status Order ID Comments User Context   05/08/2023 0554 05/14/2023 2022 Limited: Do not attempt resuscitation (DNR) -DNR-LIMITED -Do Not Intubate/DNI  [id number]  Tereasa Coop, MD ED   04/10/2021 2000 04/11/2021 2125 DNR [id number]  Edsel Petrin, DO Inpatient   04/07/2021 2119 04/10/2021 2000 Full Code [id number]  Hillary Bow, DO ED   04/28/2018 1629 05/06/2018 1658 Full Code [id number]  Leatha Gilding, MD Inpatient   04/11/2018 2232 04/14/2018 1553 Full Code [id number]  Eduard Clos, MD Inpatient       Home/SNF/Other Nursing Home  Chief Complaint Acute metabolic encephalopathy [G93.41]  Level of Care/Admitting Diagnosis ED Disposition     ED Disposition  Admit   Condition  --   Comment  Hospital Area: [facility] [[id number]]  Level of Care: Telemetry [5]  Admit to tele based on following criteria: Other see comments  Comments: Monitor for arrhythmia  May admit patient to [facility] or [facility] if equivalent level of care is available:: No  Covid Evaluation: Asymptomatic - no recent exposure (last 10 days) testing not required  Diagnosis: Acute metabolic encephalopathy [[id number]]  Admitting Physician: Tereasa Coop [[id number]]   Attending Physician: Tereasa Coop [[id number]]  Certification:: I certify this patient will need inpatient services for at least 2 midnights  Expected Medical Readiness: 06/02/2023          Medical History Past Medical History:  Diagnosis Date   Allergic rhinitis    Allergic rhinitis, cause unspecified 09/21/2011   Aortic stenosis 08/23/2016   Arthritis    Asthma    Asthma 09/21/2011   Colon polyps    COPD (chronic obstructive pulmonary disease) (HCC)    mild    Coronary artery calcification 08/23/2016   Dementia (HCC)    Depression with anxiety    HTN (hypertension)    Hyperlipidemia    UTI (lower urinary tract infection)     Allergies Allergies  Allergen Reactions   Lipitor [Atorvastatin] Other (See Comments)    Myalgias    IV Location/Drains/Wounds Patient Lines/Drains/Airways Status     Active Line/Drains/Airways     Name Placement date Placement time Site Days   Peripheral IV 05/29/23 20 G Right Antecubital 05/29/23  1513  Antecubital  1   Peripheral IV 05/29/23 22 G 1" Left Antecubital 05/29/23  1615  Antecubital  1            Labs/Imaging Results for orders placed or performed during the hospital encounter of 05/29/23 (from the past 48 hour(s))  Comprehensive metabolic panel     Status: Abnormal   Collection Time: 05/29/23  3:28 PM  Result Value  Ref Range   Sodium 136 135 - 145 mmol/L   Potassium 4.6 3.5 - 5.1 mmol/L   Chloride 99 98 - 111 mmol/L   CO2 28 22 - 32 mmol/L   Glucose, Bld 101 (H) 70 - 99 mg/dL    Comment: Glucose reference range applies only to samples taken after fasting for at least 8 hours.   BUN 20 8 - 23 mg/dL   Creatinine, Ser 4.09 (H) 0.44 - 1.00 mg/dL   Calcium 8.4 (L) 8.9 - 10.3 mg/dL   Total Protein 6.7 6.5 - 8.1 g/dL   Albumin 3.1 (L) 3.5 - 5.0 g/dL   AST 21 15 - 41 U/L   ALT 14 0 - 44 U/L   Alkaline Phosphatase 177 (H) 38 - 126 U/L   Total Bilirubin 0.4 <1.2 mg/dL   GFR, Estimated 37 (L) >60 mL/min    Comment:  (NOTE) Calculated using the CKD-EPI Creatinine Equation (2021)    Anion gap 9 5 - 15    Comment: Performed at [facility], [address] [place], [place] [address]  CBC with Differential     Status: Abnormal   Collection Time: 05/29/23  3:28 PM  Result Value Ref Range   WBC 8.8 4.0 - 10.5 K/uL   RBC 3.24 (L) 3.87 - 5.11 MIL/uL   Hemoglobin 9.7 (L) 12.0 - 15.0 g/dL   HCT 47.8 (L) 29.5 - 62.1 %   MCV 96.6 80.0 - 100.0 fL   MCH 29.9 26.0 - 34.0 pg   MCHC 31.0 30.0 - 36.0 g/dL   RDW 30.8 65.7 - 84.6 %   Platelets 449 (H) 150 - 400 K/uL   nRBC 0.0 0.0 - 0.2 %   Neutrophils Relative % 78 %   Neutro Abs 7.0 1.7 - 7.7 K/uL   Lymphocytes Relative 9 %   Lymphs Abs 0.8 0.7 - 4.0 K/uL   Monocytes Relative 11 %   Monocytes Absolute 1.0 0.1 - 1.0 K/uL   Eosinophils Relative 1 %   Eosinophils Absolute 0.0 0.0 - 0.5 K/uL   Basophils Relative 1 %   Basophils Absolute 0.1 0.0 - 0.1 K/uL   Immature Granulocytes 0 %   Abs Immature Granulocytes 0.03 0.00 - 0.07 K/uL    Comment: Performed at [facility], [address] [place], [place] [address]  Protime-INR     Status: None   Collection Time: 05/29/23  3:28 PM  Result Value Ref Range   Prothrombin Time 13.2 11.4 - 15.2 seconds   INR 1.0 0.8 - 1.2    Comment: (NOTE) INR goal varies based on device and disease states. Performed at [facility], [address] [place], [place] [address]   APTT     Status: None   Collection Time: 05/29/23  3:28 PM  Result Value Ref Range   aPTT 30 24 - 36 seconds    Comment: Performed at [facility], [address] [place], [place] [address]  Blood Culture (routine x 2)     Status: None (Preliminary result)   Collection Time: 05/29/23  3:28 PM   Specimen: BLOOD RIGHT ARM  Result Value Ref Range   Specimen Description      BLOOD RIGHT ARM Performed at [facility], [address] [place], [place] [address]     Special Requests      BOTTLES DRAWN AEROBIC AND ANAEROBIC Blood Culture adequate volume Performed at [facility], [address] [place], [place] [address]    Culture      NO GROWTH < 12 HOURS Performed at [facility], [address], [place], [place] [address]    Report Status PENDING   Resp panel by RT-PCR (RSV, Flu A&B, Covid) Anterior Nasal Swab     Status: None   Collection Time: 05/29/23  3:29 PM   Specimen: Anterior Nasal Swab  Result Value Ref Range   SARS Coronavirus 2 by RT PCR NEGATIVE NEGATIVE    Comment: (NOTE) SARS-CoV-2 target nucleic acids are NOT DETECTED.  The SARS-CoV-2 RNA is generally detectable in upper respiratory specimens during the acute phase of infection. The lowest concentration of SARS-CoV-2 viral copies this assay can detect is 138 copies/mL. A negative result does not preclude SARS-Cov-2 infection and should not be used as the sole basis for treatment or other patient management decisions. A negative result may occur with  improper specimen collection/handling, submission of specimen other than nasopharyngeal swab, presence of viral mutation(s) within the areas targeted by this assay, and inadequate number of viral copies(<138 copies/mL). A negative result must be combined with clinical observations, patient history, and epidemiological information. The expected result is Negative.  Fact Sheet for Patients:  BloggerCourse.com  Fact Sheet for Healthcare Providers:  SeriousBroker.it  This test is no t yet approved or cleared by the [place] FDA and  has been authorized for detection and/or diagnosis of SARS-CoV-2 by FDA under an Emergency Use Authorization (EUA). This EUA will remain  in effect (meaning this test can be used) for the duration of the COVID-19 declaration under Section 564(b)(1) of the Act, 21 U.S.C.section 360bbb-3(b)(1), unless the authorization is  terminated  or revoked sooner.       Influenza A by PCR NEGATIVE NEGATIVE   Influenza B by PCR NEGATIVE NEGATIVE    Comment: (NOTE) The Xpert Xpress SARS-CoV-2/FLU/RSV plus assay is intended as an aid in the diagnosis of influenza from Nasopharyngeal swab specimens and should not be used as a sole basis for treatment. Nasal washings and aspirates are unacceptable for Xpert Xpress SARS-CoV-2/FLU/RSV testing.  Fact Sheet for Patients: BloggerCourse.com  Fact Sheet for Healthcare Providers: SeriousBroker.it  This test is not yet approved or cleared by the [place] FDA and has been authorized for detection and/or diagnosis of SARS-CoV-2 by FDA under an Emergency Use Authorization (EUA). This EUA will remain in effect (meaning this test can be used) for the duration of the COVID-19 declaration under Section 564(b)(1) of the Act, 21 U.S.C. section 360bbb-3(b)(1), unless the authorization is terminated or revoked.     Resp Syncytial Virus by PCR NEGATIVE NEGATIVE    Comment: (NOTE) Fact Sheet for Patients: BloggerCourse.com  Fact Sheet for Healthcare Providers: SeriousBroker.it  This test is not yet approved or cleared by the [place] FDA and has been authorized for detection and/or diagnosis of SARS-CoV-2 by FDA under an Emergency Use Authorization (EUA). This EUA will remain in effect (meaning this test can be used) for the duration of the COVID-19 declaration under Section 564(b)(1) of the Act, 21 U.S.C. section 360bbb-3(b)(1), unless the authorization is terminated or revoked.  Performed at [facility], [address], [place], [place] [address]   I-Stat Lactic Acid, ED     Status: None   Collection Time: 05/29/23  3:35 PM  Result Value Ref Range   Lactic Acid, Venous 0.9 0.5 - 1.9 mmol/L  I-stat chem 8, ED     Status: Abnormal  Collection  Time: 05/29/23  3:35 PM  Result Value Ref Range   Sodium 138 135 - 145 mmol/L   Potassium 4.7 3.5 - 5.1 mmol/L   Chloride 100 98 - 111 mmol/L   BUN 19 8 - 23 mg/dL   Creatinine, Ser 1.61 (H) 0.44 - 1.00 mg/dL   Glucose, Bld 096 (H) 70 - 99 mg/dL    Comment: Glucose reference range applies only to samples taken after fasting for at least 8 hours.   Calcium, Ion 1.11 (L) 1.15 - 1.40 mmol/L   TCO2 27 22 - 32 mmol/L   Hemoglobin 10.5 (L) 12.0 - 15.0 g/dL   HCT 04.5 (L) 40.9 - 81.1 %  Blood Culture (routine x 2)     Status: None (Preliminary result)   Collection Time: 05/29/23  3:43 PM   Specimen: BLOOD  Result Value Ref Range   Specimen Description      BLOOD BLOOD LEFT HAND Performed at [facility], [address] [place], [place] [address]    Special Requests      BOTTLES DRAWN AEROBIC AND ANAEROBIC Blood Culture adequate volume Performed at [facility], [address] [place], [place] [address]    Culture      NO GROWTH < 12 HOURS Performed at [facility], [address], [place], [place] [address]    Report Status PENDING   Urinalysis, w/ Reflex to Culture (Infection Suspected) -Urine, Catheterized     Status: Abnormal   Collection Time: 05/29/23  6:43 PM  Result Value Ref Range   Specimen Source URINE, CATHETERIZED    Color, Urine STRAW (A) YELLOW   APPearance CLEAR CLEAR   Specific Gravity, Urine 1.031 (H) 1.005 - 1.030   pH 7.0 5.0 - 8.0   Glucose, UA NEGATIVE NEGATIVE mg/dL   Hgb urine dipstick NEGATIVE NEGATIVE   Bilirubin Urine NEGATIVE NEGATIVE   Ketones, ur 5 (A) NEGATIVE mg/dL   Protein, ur NEGATIVE NEGATIVE mg/dL   Nitrite NEGATIVE NEGATIVE   Leukocytes,Ua NEGATIVE NEGATIVE   RBC / HPF 0-5 0 - 5 RBC/hpf   WBC, UA 0-5 0 - 5 WBC/hpf    Comment:        Reflex urine culture not performed if WBC <=10, OR if Squamous epithelial cells >5. If Squamous epithelial cells >5 suggest recollection.    Bacteria, UA NONE  SEEN NONE SEEN   Squamous Epithelial / HPF 0-5 0 - 5 /HPF    Comment: Performed at [facility], [address], [place], [place] [address]  Comprehensive metabolic panel     Status: Abnormal   Collection Time: 05/30/23  5:00 AM  Result Value Ref Range   Sodium 137 135 - 145 mmol/L   Potassium 3.9 3.5 - 5.1 mmol/L   Chloride 103 98 - 111 mmol/L   CO2 25 22 - 32 mmol/L   Glucose, Bld 86 70 - 99 mg/dL    Comment: Glucose reference range applies only to samples taken after fasting for at least 8 hours.   BUN 12 8 - 23 mg/dL   Creatinine, Ser 9.62 0.44 - 1.00 mg/dL   Calcium 8.2 (L) 8.9 - 10.3 mg/dL   Total Protein 6.3 (L) 6.5 - 8.1 g/dL   Albumin 2.8 (L) 3.5 - 5.0 g/dL   AST 25 15 - 41 U/L   ALT 14 0 - 44 U/L   Alkaline Phosphatase 172 (H) 38 - 126 U/L   Total Bilirubin 0.4 <  1.2 mg/dL   GFR, Estimated >16 >10 mL/min    Comment: (NOTE) Calculated using the CKD-EPI Creatinine Equation (2021)    Anion gap 9 5 - 15    Comment: Performed at [facility], [address], [place], [place] [address]  CBC     Status: Abnormal   Collection Time: 05/30/23  5:00 AM  Result Value Ref Range   WBC 11.7 (H) 4.0 - 10.5 K/uL   RBC 3.14 (L) 3.87 - 5.11 MIL/uL   Hemoglobin 9.2 (L) 12.0 - 15.0 g/dL   HCT 40.9 (L) 81.1 - 91.4 %   MCV 97.5 80.0 - 100.0 fL   MCH 29.3 26.0 - 34.0 pg   MCHC 30.1 30.0 - 36.0 g/dL   RDW 78.2 95.6 - 21.3 %   Platelets 418 (H) 150 - 400 K/uL   nRBC 0.0 0.0 - 0.2 %    Comment: Performed at [facility], [address], [place], [place] [address]  Ammonia     Status: None   Collection Time: 05/30/23  5:00 AM  Result Value Ref Range   Ammonia 26 9 - 35 umol/L    Comment: Performed at [facility], [address], [place], [place] [address]   ECHOCARDIOGRAM COMPLETE  Result Date: 05/30/2023    ECHOCARDIOGRAM REPORT   Patient Name:   Andrea Larson Date of Exam: 05/30/2023 Medical Rec #:   [id number]         Height:       62.0 in Accession #:    [id number]        Weight:       124.8 lb Date of Birth:  Mar 09, 1946         BSA:          1.564 m Patient Age:    77 years          BP:           111/62 mmHg Patient Gender: F                 HR:           103 bpm. Exam Location:  Inpatient Procedure: 2D Echo, Cardiac Doppler and Color Doppler Indications:    a flutter  History:        Patient has prior history of Echocardiogram examinations, most                 recent 11/27/2019. CAD, COPD, Aortic Valve Disease; Risk                 Factors:Hypertension.  Sonographer:    Harriette Bouillon Referring Phys: [id number] SUBRINA SUNDIL  Sonographer Comments: Technically challenging study due to limited acoustic windows. IMPRESSIONS  1. Left ventricular ejection fraction, by estimation, is 60 to 65%. The left ventricle has normal function. The left ventricle has no regional wall motion abnormalities. Left ventricular diastolic function could not be evaluated.  2. Right ventricular systolic function is normal. The right ventricular size is normal.  3. The mitral valve is degenerative. No evidence of mitral valve regurgitation. Moderate mitral annular calcification.  4. The aortic valve was not well visualized. Aortic valve regurgitation is not visualized. Moderate aortic valve stenosis. Aortic valve mean gradient measures 14.0 mmHg. Aortic valve Vmax measures 2.39 m/s. DVI 0.3 and low SVI of 19. 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  5. The inferior vena cava is normal in size with  greater than 50% respiratory variability, suggesting right atrial pressure of 3 mmHg. FINDINGS  Left Ventricle: Left ventricular ejection fraction, by estimation, is 60 to 65%. The left ventricle has normal function. The left ventricle has no regional wall motion abnormalities. The left ventricular internal cavity size was normal in size. There is  no left ventricular hypertrophy. Left ventricular diastolic  function could not be evaluated. Right Ventricle: The right ventricular size is normal. No increase in right ventricular wall thickness. Right ventricular systolic function is normal. Left Atrium: Left atrial size was normal in size. Right Atrium: Right atrial size was normal in size. Pericardium: There is no evidence of pericardial effusion. Mitral Valve: The mitral valve is degenerative in appearance. There is moderate thickening of the mitral valve leaflet(s). There is mild calcification of the mitral valve leaflet(s). Moderate mitral annular calcification. No evidence of mitral valve regurgitation. Tricuspid Valve: The tricuspid valve is normal in structure. Tricuspid valve regurgitation is not demonstrated. No evidence of tricuspid stenosis. Aortic Valve: The aortic valve was not well visualized. Aortic valve regurgitation is not visualized. Moderate aortic stenosis is present. Aortic valve mean gradient measures 14.0 mmHg. Aortic valve peak gradient measures 22.9 mmHg. Aortic valve area, by  VTI measures 0.59 cm. Pulmonic Valve: The pulmonic valve was normal in structure. Pulmonic valve regurgitation is not visualized. No evidence of pulmonic stenosis. Aorta: The aortic root is normal in size and structure. Venous: The inferior vena cava is normal in size with greater than 50% respiratory variability, suggesting right atrial pressure of 3 mmHg. IAS/Shunts: No atrial level shunt detected by color flow Doppler.  LEFT VENTRICLE PLAX 2D LVIDd:         3.80 cm LVIDs:         2.50 cm LV PW:         1.00 cm LV IVS:        1.00 cm LVOT diam:     1.60 cm LV SV:         29 LV SV Index:   19 LVOT Area:     2.01 cm  IVC IVC diam: 1.80 cm LEFT ATRIUM         Index LA diam:    3.30 cm 2.11 cm/m  AORTIC VALVE AV Area (Vmax):    0.56 cm AV Area (Vmean):   0.55 cm AV Area (VTI):     0.59 cm AV Vmax:           239.20 cm/s AV Vmean:          171.200 cm/s AV VTI:            0.491 m AV Peak Grad:      22.9 mmHg AV Mean Grad:       14.0 mmHg LVOT Vmax:         67.00 cm/s LVOT Vmean:        47.000 cm/s LVOT VTI:          0.145 m LVOT/AV VTI ratio: 0.30  AORTA Ao Root diam: 2.60 cm  SHUNTS Systemic VTI:  0.14 m Systemic Diam: 1.60 cm Armanda Magic MD Electronically signed by Armanda Magic MD Signature Date/Time: 05/30/2023/3:02:36 PM    Final    CT Head Wo Contrast  Result Date: 05/29/2023 CLINICAL DATA:  Altered mental status. EXAM: CT HEAD WITHOUT CONTRAST TECHNIQUE: Contiguous axial images were obtained from the base of the skull through the vertex without intravenous contrast. RADIATION DOSE REDUCTION: This exam was performed according to the  departmental dose-optimization program which includes automated exposure control, adjustment of the mA and/or kV according to patient size and/or use of iterative reconstruction technique. COMPARISON:  Head CT dated 04/07/2021. FINDINGS: Brain: Mild age-related atrophy and chronic microvascular ischemic changes. There is no acute intracranial hemorrhage. No mass effect or midline shift. No extra-axial fluid collection. Vascular: No hyperdense vessel or unexpected calcification. Skull: Normal. Negative for fracture or focal lesion. Sinuses/Orbits: Mild mucoperiosteal thickening of paranasal sinuses. The mastoid air cells are clear. Other: None IMPRESSION: 1. No acute intracranial pathology. 2. Mild age-related atrophy and chronic microvascular ischemic changes. Electronically Signed   By: Elgie Collard M.D.   On: 05/29/2023 23:36   CT ABDOMEN PELVIS W CONTRAST  Addendum Date: 05/29/2023   ADDENDUM REPORT: 05/29/2023 19:21 ADDENDUM: After further review and comparison to today's chest CT, there are questionable areas of decreased perfusion in the left kidney, mostly upper and lower poles, but there is extensive respiratory motion which limits evaluation. Recommend clinical correlation for possible pyelonephritis. Electronically Signed   By: Charlett Nose M.D.   On: 05/29/2023 19:21    Result Date: 05/29/2023 CLINICAL DATA:  Abdominal pain, acute, nonlocalized EXAM: CT ABDOMEN AND PELVIS WITH CONTRAST TECHNIQUE: Multidetector CT imaging of the abdomen and pelvis was performed using the standard protocol following bolus administration of intravenous contrast. RADIATION DOSE REDUCTION: This exam was performed according to the departmental dose-optimization program which includes automated exposure control, adjustment of the mA and/or kV according to patient size and/or use of iterative reconstruction technique. CONTRAST:  80mL OMNIPAQUE IOHEXOL 350 MG/ML SOLN COMPARISON:  03/27/2019 FINDINGS: Lower chest: No acute abnormality. Coronary artery disease, aortic atherosclerosis. Hepatobiliary: Small layering gallstones within the gallbladder. No focal hepatic abnormality or biliary ductal dilatation. Pancreas: No focal abnormality or ductal dilatation. Spleen: No focal abnormality.  Normal size. Adrenals/Urinary Tract: No adrenal abnormality. No focal renal abnormality. No stones or hydronephrosis. Urinary bladder decompressed. Stomach/Bowel: Sigmoid diverticulosis. No active diverticulitis. Stomach and small bowel decompressed, unremarkable. Vascular/Lymphatic: Diffuse aortoiliac atherosclerosis. No evidence of aneurysm or adenopathy. Reproductive: Uterus and adnexa unremarkable.  No mass. Other: No free fluid or free air. Periumbilical hernias noted containing fat on the left and fat and a portion of the anterior wall of the transverse colon on the right. No bowel obstruction. Musculoskeletal: No acute bony abnormality. IMPRESSION: No acute findings in the abdomen or pelvis. Diffuse aortoiliac atherosclerosis, coronary artery disease. Cholelithiasis.  No evidence of acute cholecystitis. Periumbilical ventral hernias containing fat and anterior wall of the transverse colon. No bowel obstruction. Electronically Signed: By: Charlett Nose M.D. On: 05/29/2023 19:13   CT Angio Chest PE W and/or Wo  Contrast  Result Date: 05/29/2023 CLINICAL DATA:  Pulmonary embolism (PE) suspected, high prob. Altered mental status. EXAM: CT ANGIOGRAPHY CHEST WITH CONTRAST TECHNIQUE: Multidetector CT imaging of the chest was performed using the standard protocol during bolus administration of intravenous contrast. Multiplanar CT image reconstructions and MIPs were obtained to evaluate the vascular anatomy. RADIATION DOSE REDUCTION: This exam was performed according to the departmental dose-optimization program which includes automated exposure control, adjustment of the mA and/or kV according to patient size and/or use of iterative reconstruction technique. CONTRAST:  80mL OMNIPAQUE IOHEXOL 350 MG/ML SOLN COMPARISON:  01/08/2023 FINDINGS: Cardiovascular: No filling defects in the pulmonary arteries to suggest pulmonary emboli. Heart is normal size. Aorta is normal caliber. Coronary artery and aortic atherosclerosis. Mediastinum/Nodes: No mediastinal, hilar, or axillary adenopathy. Trachea and esophagus are unremarkable. Thyroid unremarkable. Lungs/Pleura: No confluent opacities or  effusions. Upper Abdomen: No acute findings Musculoskeletal: No acute bony abnormality. Numerous moderate to severe compression fractures in the midthoracic spine, unchanged since prior study. Review of the MIP images confirms the above findings. IMPRESSION: No evidence of pulmonary embolus. No acute cardiopulmonary disease. Coronary artery disease. Aortic Atherosclerosis (ICD10-I70.0). Electronically Signed   By: Charlett Nose M.D.   On: 05/29/2023 19:17   DG Chest Port 1 View  Result Date: 05/29/2023 CLINICAL DATA:  Altered mental status. EXAM: PORTABLE CHEST 1 VIEW COMPARISON:  May 09, 2023. FINDINGS: The heart size and mediastinal contours are within normal limits. Both lungs are clear. The visualized skeletal structures are unremarkable. IMPRESSION: No active disease. Electronically Signed   By: Lupita Raider M.D.   On: 05/29/2023  18:11    Pending Labs Unresulted Labs (From admission, onward)     Start     Ordered   05/31/23 0500  CBC  Daily,   R      05/30/23 0507   05/31/23 0500  Comprehensive metabolic panel  Daily,   R      05/30/23 0507   05/29/23 2228  Urine Culture  Once,   URGENT       Question:  Indication  Answer:  Dysuria   05/29/23 2227            Vitals/Pain Today's Vitals   05/30/23 1100 05/30/23 1215 05/30/23 1312 05/30/23 1316  BP: 123/61 (!) 107/59  111/62  Pulse: (!) 57 86  (!) 107  Resp: (!) 31 (!) 29  (!) 32  Temp:    97.9 F (36.6 C)  TempSrc:    Oral  SpO2: 93% 99% 98% 100%  PainSc:        Isolation Precautions No active isolations  Medications Medications  lactated ringers infusion ( Intravenous Restarted 05/30/23 1140)  aspirin chewable tablet 81 mg (81 mg Oral Given 05/30/23 0925)  rosuvastatin (CRESTOR) tablet 20 mg (20 mg Oral Given 05/30/23 0926)  donepezil (ARICEPT) tablet 5 mg (5 mg Oral Given 05/30/23 0037)  FLUoxetine (PROZAC) capsule 40 mg (40 mg Oral Given 05/30/23 0925)  memantine (NAMENDA) tablet 10 mg (10 mg Oral Given 05/30/23 0925)  buPROPion (WELLBUTRIN XL) 24 hr tablet 150 mg (150 mg Oral Given 05/30/23 0926)  docusate sodium (COLACE) capsule 100 mg (100 mg Oral Given 05/30/23 0925)  pantoprazole (PROTONIX) EC tablet 40 mg (40 mg Oral Given 05/30/23 0925)  thiamine (VITAMIN B1) tablet 100 mg (100 mg Oral Given 05/30/23 0925)  fluticasone (FLONASE) 50 MCG/ACT nasal spray 2 spray (2 sprays Each Nare Given 05/30/23 0930)  ipratropium (ATROVENT) nebulizer solution 0.5 mg (0.5 mg Nebulization Given 05/30/23 1311)  levalbuterol (XOPENEX) nebulizer solution 0.63 mg (has no administration in time range)  montelukast (SINGULAIR) tablet 10 mg (10 mg Oral Given 05/30/23 0926)  fluticasone furoate-vilanterol (BREO ELLIPTA) 200-25 MCG/ACT 1 puff (1 puff Inhalation Not Given 05/30/23 0912)    And  umeclidinium bromide (INCRUSE ELLIPTA) 62.5 MCG/ACT 1 puff (1 puff  Inhalation Not Given 05/30/23 0913)  cefTRIAXone (ROCEPHIN) injection 2 g (2 g Intramuscular Given 05/30/23 0053)  acetaminophen (TYLENOL) tablet 650 mg (has no administration in time range)    Or  acetaminophen (TYLENOL) suppository 650 mg (has no administration in time range)  ondansetron (ZOFRAN) tablet 4 mg (has no administration in time range)    Or  ondansetron (ZOFRAN) injection 4 mg (has no administration in time range)  morphine (PF) 2 MG/ML injection 1 mg (has no administration in time  range)  enoxaparin (LOVENOX) injection 40 mg (has no administration in time range)  metoprolol tartrate (LOPRESSOR) injection 2.5 mg (2.5 mg Intravenous Given 05/30/23 0643)  lactated ringers bolus 1,000 mL (0 mLs Intravenous Stopped 05/29/23 1656)    And  lactated ringers bolus 500 mL (0 mLs Intravenous Stopped 05/29/23 1907)    And  lactated ringers bolus 250 mL (0 mLs Intravenous Stopped 05/29/23 1907)  ceFEPIme (MAXIPIME) 2 g in sodium chloride 0.9 % 100 mL IVPB (0 g Intravenous Stopped 05/29/23 1656)  metroNIDAZOLE (FLAGYL) IVPB 500 mg (0 mg Intravenous Stopped 05/29/23 1739)  vancomycin (VANCOCIN) IVPB 1000 mg/200 mL premix (0 mg Intravenous Stopped 05/29/23 1907)  iohexol (OMNIPAQUE) 350 MG/ML injection 80 mL (80 mLs Intravenous Contrast Given 05/29/23 1607)  acetaminophen (TYLENOL) tablet 1,000 mg (1,000 mg Oral Given 05/29/23 1737)  lidocaine (PF) (XYLOCAINE) 1 % injection 5 mL (5 mLs Other Given 05/30/23 0054)    Mobility non-ambulatory

## 2023-05-30 NOTE — Progress Notes (Signed)
  Echocardiogram 2D Echocardiogram has been performed.  Leda Roys RDCS 05/30/2023, 3:06 PM

## 2023-05-30 NOTE — Progress Notes (Addendum)
Paroxysmal atrial fibrillation/flutter: - Initial EKG showed sinus tachycardia.  At bedside patient continues to have heart rate 110-120.  Repeat EKG obtained which showed  Atrial flutter with predominant 2:1 AV block.  Patient is hemodynamically stable and asymptomatic.  Normal electrolytes. - Concern for atrial flutter/fibrillation in the setting of left-sided pyelonephritis which is currently treating with IV antibiotic and also dissociating patient with IV fluid. - Continue Lopressor 2.5 mg every 5 need as needed total 3 doses if heart rate above 120. -Continue cardiac monitoring - Obtain echocardiogram. - Consulting cardiology for further evaluation.  Tereasa Coop, MD [facility] 05/30/2023, 6:30 AM

## 2023-05-30 NOTE — Plan of Care (Signed)

## 2023-05-30 NOTE — Progress Notes (Signed)
PHARMACY - PHYSICIAN COMMUNICATION CRITICAL VALUE ALERT - BLOOD CULTURE IDENTIFICATION (BCID)  Andrea Larson is an 77 y.o. female who presented to [facility] on 05/29/2023 with a chief complaint of acute metabolic encephalopathy  Assessment:  1/4 bottles positive - per BCID, with methicillin resistant staph epi, likely contaminant  Name of physician (or Provider) Contacted: Pahwani  Current antibiotics: Rocephin  Changes to prescribed antibiotics recommended:  No changes needed  Results for orders placed or performed during the hospital encounter of 05/29/23  Blood Culture ID Panel (Reflexed) (Collected: 05/29/2023  3:28 PM)  Result Value Ref Range   Enterococcus faecalis NOT DETECTED NOT DETECTED   Enterococcus Faecium NOT DETECTED NOT DETECTED   Listeria monocytogenes NOT DETECTED NOT DETECTED   Staphylococcus species DETECTED (A) NOT DETECTED   Staphylococcus aureus (BCID) NOT DETECTED NOT DETECTED   Staphylococcus epidermidis DETECTED (A) NOT DETECTED   Staphylococcus lugdunensis NOT DETECTED NOT DETECTED   Streptococcus species NOT DETECTED NOT DETECTED   Streptococcus agalactiae NOT DETECTED NOT DETECTED   Streptococcus pneumoniae NOT DETECTED NOT DETECTED   Streptococcus pyogenes NOT DETECTED NOT DETECTED   A.calcoaceticus-baumannii NOT DETECTED NOT DETECTED   Bacteroides fragilis NOT DETECTED NOT DETECTED   Enterobacterales NOT DETECTED NOT DETECTED   Enterobacter cloacae complex NOT DETECTED NOT DETECTED   Escherichia coli NOT DETECTED NOT DETECTED   Klebsiella aerogenes NOT DETECTED NOT DETECTED   Klebsiella oxytoca NOT DETECTED NOT DETECTED   Klebsiella pneumoniae NOT DETECTED NOT DETECTED   Proteus species NOT DETECTED NOT DETECTED   Salmonella species NOT DETECTED NOT DETECTED   Serratia marcescens NOT DETECTED NOT DETECTED   Haemophilus influenzae NOT DETECTED NOT DETECTED   Neisseria meningitidis NOT DETECTED NOT DETECTED   Pseudomonas aeruginosa NOT  DETECTED NOT DETECTED   Stenotrophomonas maltophilia NOT DETECTED NOT DETECTED   Candida albicans NOT DETECTED NOT DETECTED   Candida auris NOT DETECTED NOT DETECTED   Candida glabrata NOT DETECTED NOT DETECTED   Candida krusei NOT DETECTED NOT DETECTED   Candida parapsilosis NOT DETECTED NOT DETECTED   Candida tropicalis NOT DETECTED NOT DETECTED   Cryptococcus neoformans/gattii NOT DETECTED NOT DETECTED   Methicillin resistance mecA/C DETECTED (A) NOT DETECTED    Berkley Harvey 05/30/2023  5:47 PM

## 2023-05-31 DIAGNOSIS — N12 Tubulo-interstitial nephritis, not specified as acute or chronic: Secondary | ICD-10-CM | POA: Diagnosis not present

## 2023-05-31 DIAGNOSIS — J9611 Chronic respiratory failure with hypoxia: Secondary | ICD-10-CM | POA: Diagnosis not present

## 2023-05-31 DIAGNOSIS — G9341 Metabolic encephalopathy: Secondary | ICD-10-CM | POA: Diagnosis not present

## 2023-05-31 LAB — CBC
HCT: 29.2 % — ABNORMAL LOW (ref 36.0–46.0)
Hemoglobin: 8.4 g/dL — ABNORMAL LOW (ref 12.0–15.0)
MCH: 29.1 pg (ref 26.0–34.0)
MCHC: 28.8 g/dL — ABNORMAL LOW (ref 30.0–36.0)
MCV: 101 fL — ABNORMAL HIGH (ref 80.0–100.0)
Platelets: 350 10*3/uL (ref 150–400)
RBC: 2.89 MIL/uL — ABNORMAL LOW (ref 3.87–5.11)
RDW: 15.1 % (ref 11.5–15.5)
WBC: 8.4 10*3/uL (ref 4.0–10.5)
nRBC: 0 % (ref 0.0–0.2)

## 2023-05-31 LAB — GLUCOSE, CAPILLARY
Glucose-Capillary: 21 mg/dL — CL (ref 70–99)
Glucose-Capillary: 69 mg/dL — ABNORMAL LOW (ref 70–99)
Glucose-Capillary: 105 mg/dL — ABNORMAL HIGH (ref 70–99)
Glucose-Capillary: 49 mg/dL — ABNORMAL LOW (ref 70–99)
Glucose-Capillary: 76 mg/dL (ref 70–99)
Glucose-Capillary: 129 mg/dL — ABNORMAL HIGH (ref 70–99)
Glucose-Capillary: 129 mg/dL — ABNORMAL HIGH (ref 70–99)
Glucose-Capillary: 141 mg/dL — ABNORMAL HIGH (ref 70–99)

## 2023-05-31 LAB — COMPREHENSIVE METABOLIC PANEL
ALT: 13 U/L (ref 0–44)
AST: 21 U/L (ref 15–41)
Albumin: 2.5 g/dL — ABNORMAL LOW (ref 3.5–5.0)
Alkaline Phosphatase: 144 U/L — ABNORMAL HIGH (ref 38–126)
Anion gap: 14 (ref 5–15)
BUN: 14 mg/dL (ref 8–23)
CO2: 21 mmol/L — ABNORMAL LOW (ref 22–32)
Calcium: 8.3 mg/dL — ABNORMAL LOW (ref 8.9–10.3)
Chloride: 104 mmol/L (ref 98–111)
Creatinine, Ser: 0.9 mg/dL (ref 0.44–1.00)
GFR, Estimated: >60 mL/min (ref >60)
Glucose, Bld: 30 mg/dL — CL (ref 70–99)
Potassium: 4.1 mmol/L (ref 3.5–5.1)
Sodium: 139 mmol/L (ref 135–145)
Total Bilirubin: 0.9 mg/dL (ref <1.2)
Total Protein: 5.5 g/dL — ABNORMAL LOW (ref 6.5–8.1)

## 2023-05-31 LAB — HEMOGLOBIN A1C
Hgb A1c MFr Bld: 4.8 % (ref 4.8–5.6)
Mean Plasma Glucose: 91.06 mg/dL

## 2023-05-31 MED ORDER — OXYCODONE HCL 5 MG PO TABS
5.0000 mg | ORAL_TABLET | Freq: Four times a day (QID) | ORAL | Status: DC | PRN
  Administered 2023-05-31 – 2023-06-03 (×9): 5 mg via ORAL
  Filled 2023-05-31 (×9): qty 1

## 2023-05-31 MED ORDER — STERILE WATER FOR INJECTION IJ SOLN
INTRAMUSCULAR | Status: AC
  Administered 2023-06-01: 6 mL
  Filled 2023-05-31: qty 10

## 2023-05-31 MED ORDER — DEXTROSE 50 % IV SOLN: 25.0000 mL | INTRAVENOUS | Status: AC | PRN

## 2023-05-31 NOTE — Progress Notes (Signed)
 orange juice was given, patient was alert and oriented, did not experience any symptom of low BG. Patient swallowed well and RN will recheck BG in 15 minutes.

## 2023-05-31 NOTE — TOC Initial Note (Signed)
Transition of Care [facility]) - Initial/Assessment Note    Patient Details  Name: Andrea Larson MRN: [id number] Date of Birth: 1946/06/29  Transition of Care [facility]) CM/SW Contact:    Otelia Santee, LCSW Phone Number: 05/31/2023, 12:44 PM  Clinical Narrative:                 Pt  is a resident at [facility]. Pt has most recently been at [facility] for [facility] SNF. Spoke with pt's son who would like pt to be able to return to [facility] but, believes pt will need continued SNF placement at discharge due to her functioning.  PT/OT have been consulted. TOC will follow for evaluations and recommendations.   Expected Discharge Plan:  (TBD- SNF VS ALF) Barriers to Discharge: Continued Medical Work up   Patient Goals and CMS Choice Patient states their goals for this hospitalization and ongoing recovery are:: For pt to go to SNF CMS Medicare.gov Compare Post Acute Care list provided to:: Patient Represenative (must comment) Choice offered to / list presented to : [facility] POA / Guardian  ownership interest in [facility].provided to:: [facility] POA / Guardian    Expected Discharge Plan and Services In-house Referral: Clinical Social Work Discharge Planning Services: NA Post Acute Care Choice: Skilled Nursing Facility, Resumption of Svcs/PTA Provider Living arrangements for the past 2 months: Assisted Living Facility ([facility])                 DME Arranged: N/A DME Agency: NA                  Prior Living Arrangements/Services Living arrangements for the past 2 months: Assisted Living Facility ([facility]) Lives with:: Facility Resident Patient language and need for interpreter reviewed:: Yes Do you feel safe going back to the place where you live?: Yes      Need for Family Participation in Patient Care: Yes (Comment) Care giver support system in place?: Yes (comment) Current home services: DME Criminal Activity/Legal Involvement Pertinent to Current  Situation/Hospitalization: No - Comment as needed  Activities of Daily Living   ADL Screening (condition at time of admission) Independently performs ADLs?: Yes (appropriate for developmental age) Is the patient deaf or have difficulty hearing?: No Does the patient have difficulty seeing, even when wearing glasses/contacts?: No Does the patient have difficulty concentrating, remembering, or making decisions?: No  Permission Sought/Granted Permission sought to share information with : Facility Medical sales representative, Family Supports Permission granted to share information with : Yes, Verbal Permission Granted  Share Information with NAME: son, Chyrel Masson  Permission granted to share info w AGENCY: SNF's        Emotional Assessment Appearance:: Appears stated age Attitude/Demeanor/Rapport: Engaged Affect (typically observed): Pleasant Orientation: : Oriented to Self Alcohol / Substance Use: Not Applicable Psych Involvement: No (comment)  Admission diagnosis:  Acute metabolic encephalopathy [G93.41] Patient Active Problem List   Diagnosis Date Noted   Multifocal atrial tachycardia (HCC) 05/30/2023   Mild aortic stenosis 05/30/2023   Acute metabolic encephalopathy 05/29/2023   Pyelonephritis of left kidney 05/29/2023   Closed fracture of multiple pubic rami, right, initial encounter (HCC) 05/08/2023   Closed displaced fracture of right femoral neck (HCC) 05/08/2023   History of COPD 05/08/2023   GAD (generalized anxiety disorder) 05/08/2023   Fall at nursing home. 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   Dysphagia  Protein-calorie malnutrition, severe 04/10/2021   Aortic atherosclerosis (HCC) 04/04/2021   Depression with anxiety    History of colon polyps 10/17/2020   Vitamin D deficiency 10/17/2020   Dementia without behavioral disturbance  (HCC) 08/09/2020   Gait abnormality 08/09/2020   Chronic hypoxic respiratory failure (HCC) 04/10/2020   Peripheral edema 05/21/2018   Gait disorder 05/21/2018   Chronic kidney disease (CKD), stage III (moderate) (HCC) 04/28/2018   Malnutrition of moderate degree 04/13/2018   Essential hypertension 04/11/2018   Gallstones 04/11/2018   Coronary artery calcification 08/23/2016   Aortic stenosis 08/23/2016   Insomnia 08/18/2015   GERD (gastroesophageal reflux disease) 08/18/2015   Back pain 10/28/2012   Asthma 09/21/2011   Allergic rhinitis 09/21/2011   Colon polyps 09/21/2011   Encounter for well adult exam with abnormal findings 09/15/2011   Spondylosis 10/21/2008   Overweight 02/10/2008   ANXIETY DEPRESSION 02/10/2008   COPD (chronic obstructive pulmonary disease) (HCC) 02/10/2008   EMPHYSEMA NEC 01/30/2007   Hyperlipidemia 12/31/2006   PCP:  Caesar Bookman, NP Pharmacy:   [facility] - [place], [place] - [address] [place] [address] Phone: [phone] Fax: [phone]     Social Determinants of Health (SDOH) Social History: SDOH Screenings   Food Insecurity: No Food Insecurity (05/30/2023)  Housing: Low Risk  (05/30/2023)  Transportation Needs: No Transportation Needs (05/30/2023)  Utilities: Not At Risk (05/30/2023)  Depression (PHQ2-9): Low Risk  (02/11/2023)  Tobacco Use: Medium Risk (05/29/2023)   SDOH Interventions: Food Insecurity Interventions: Intervention Not Indicated Housing Interventions: Intervention Not Indicated Transportation Interventions: Intervention Not Indicated Utilities Interventions: Intervention Not Indicated   Readmission Risk Interventions    05/31/2023   12:42 PM 05/10/2023    2:54 PM 04/11/2021    1:22 PM  Readmission Risk Prevention Plan  Transportation Screening Complete Complete Complete  PCP or Specialist Appt within 5-7 Days Complete Complete   PCP or Specialist Appt  within 3-5 Days   Complete  Home Care Screening Complete Complete   Medication Review (RN CM) Complete Complete   HRI or Home Care Consult   Complete  Social Work Consult for Recovery Care Planning/Counseling   Complete  Palliative Care Screening   Complete  Medication Review Oceanographer)   Complete

## 2023-05-31 NOTE — Evaluation (Signed)
Physical Therapy Evaluation Patient Details Name: Andrea Larson MRN: [id number] DOB: Jul 27, 1945 Today's Date: 05/31/2023  History of Present Illness  Ms. Andrea Larson is a 77 yr old female admitted to the hospital 05-29-23 with altered mental status. 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 PMH: HTN, GAD, dementia, CKD 3, COPD, vitamin B12 deficiencey, HLD, chronic hypoxic respiratory failure, recent R femur fracture s/p hemiarthroplasty (anterior approach) on 05-09-23.  Clinical Impression  Pt admitted with above diagnosis.  Pt currently with functional limitations due to the deficits listed below (see PT Problem List). Pt will benefit from acute skilled PT to increase their independence and safety with mobility to allow discharge.  Pt anxious with mobilizing and does not remember admission or even this morning getting OOB with OT.  Pt assisted safely back to bed and requiring at least mod assist for transfers.  Pt's son present and reports pt was ambulating with RW and staff at rehab prior to admission.  Pt and son hopeful for pt to eventually return to her assisted living facility.  Recommend pt return to SNF to continue rehab upon d/c prior to return to ALF.        If plan is discharge home, recommend the following: A lot of help with walking and/or transfers;A lot of help with bathing/dressing/bathroom;Assistance with cooking/housework;Help with stairs or ramp for entrance;Assist for transportation   Can travel by private vehicle        Equipment Recommendations None recommended by PT  Recommendations for Other Services       Functional Status Assessment Patient has had a recent decline in their functional status and demonstrates the ability to make significant improvements in function in a reasonable and predictable amount of time.     Precautions / Restrictions  Precautions Precautions: Fall Precaution Comments: chronic O2 (at night only?) Restrictions RLE Weight Bearing: Weight bearing as tolerated      Mobility  Bed Mobility Overal bed mobility: Needs Assistance Bed Mobility: Sit to Supine       Sit to supine: Mod assist   General bed mobility comments: verbal cues for technique, assist required for lower body, pt required total assist to scoot up HOB once supine    Transfers Overall transfer level: Needs assistance Equipment used: Rolling walker (2 wheels) Transfers: Sit to/from Stand Sit to Stand: Mod assist   Step pivot transfers: Min assist       General transfer comment: multimodal cues for hand placement, assist to rise and steady, assist for stability to small steps from recliner to bed; pt also appears very anxious but agreeable to participate with assurance of assist as needed    Ambulation/Gait               General Gait Details: pt declined ability today, requested assist back to bed  Stairs            Wheelchair Mobility     Tilt Bed    Modified Rankin (Stroke Patients Only)       Balance Overall balance assessment: History of Falls, Needs assistance         Standing balance support: Bilateral upper extremity supported, During functional activity, Reliant on assistive device for balance Standing balance-Leahy Scale: Poor  Pertinent Vitals/Pain Pain Assessment Pain Assessment: Faces Faces Pain Scale: Hurts little more Pain Location: right hip Pain Descriptors / Indicators: Grimacing, Discomfort, Sore Pain Intervention(s): Repositioned, Monitored during session, Premedicated before session    Home Living Family/patient expects to be discharged to:: Assisted living                   Additional Comments: Pt was admitted from [facility] (rehab after recent admission for hip fx s/p right hip hemiarthroplasty).  Pt was living in ALF  prior to that admission.    Prior Function Prior Level of Function : Needs assist             Mobility Comments: pt was able to ambulate short distance with RW and staff in rehab per son; son reports improvement in cognition today compared to yesterday but not yet at baseline ADLs Comments: The pt was unable to recall.     Extremity/Trunk Assessment   Upper Extremity Assessment Upper Extremity Assessment: Right hand dominant;LUE deficits/detail;RUE deficits/detail RUE Deficits / Details: Chronic appearing shoulder ROM limitations with AROM for shoulder flexion being <90 degrees. Elbow and hand AROM WFL. Functional grip strength LUE Deficits / Details: AROM WFL. Functional grip strength    Lower Extremity Assessment Lower Extremity Assessment: Generalized weakness    Cervical / Trunk Assessment Cervical / Trunk Assessment: Kyphotic  Communication   Communication Communication: No apparent difficulties  Cognition Arousal: Alert Behavior During Therapy: Anxious Overall Cognitive Status: Impaired/Different from baseline Area of Impairment: Problem solving                 Orientation Level: Disoriented to, Situation   Memory: Decreased short-term memory       Problem Solving: Slow processing, Difficulty sequencing General Comments: not orientated to situation, does not recall why she is in hospital; son present and reports cognition improved since admission but not yet at her baseline        General Comments      Exercises     Assessment/Plan    PT Assessment Patient needs continued PT services  PT Problem List Decreased strength;Decreased safety awareness;Decreased range of motion;Decreased knowledge of precautions;Decreased activity tolerance;Decreased cognition;Pain;Decreased balance;Decreased knowledge of use of DME;Decreased mobility       PT Treatment Interventions DME instruction;Therapeutic exercise;Gait training;Functional mobility  training;Cognitive remediation;Therapeutic activities;Patient/family education    PT Goals (Current goals can be found in the Care Plan section)  Acute Rehab PT Goals PT Goal Formulation: With patient/family Time For Goal Achievement: 06/14/23 Potential to Achieve Goals: Fair    Frequency Min 1X/week     Co-evaluation               AM-PAC PT "6 Clicks" Mobility  Outcome Measure Help needed turning from your back to your side while in a flat bed without using bedrails?: A Lot Help needed moving from lying on your back to sitting on the side of a flat bed without using bedrails?: A Lot Help needed moving to and from a bed to a chair (including a wheelchair)?: A Lot Help needed standing up from a chair using your arms (e.g., wheelchair or bedside chair)?: A Lot Help needed to walk in hospital room?: Total Help needed climbing 3-5 steps with a railing? : Total 6 Click Score: 10    End of Session Equipment Utilized During Treatment: Gait belt;Oxygen (remained on 3L O2 [place]) Activity Tolerance: Patient limited by fatigue Patient left: in bed;with call bell/phone within reach;with bed alarm set;with family/visitor  present   PT Visit Diagnosis: Unsteadiness on feet (R26.81);Muscle weakness (generalized) (M62.81);Difficulty in walking, not elsewhere classified (R26.2);History of falling (Z91.81)    Time: 1610-9604 PT Time Calculation (min) (ACUTE ONLY): 20 min   Charges:   PT Evaluation $PT Eval Low Complexity: 1 Low   PT General Charges $$ ACUTE PT VISIT: 1 Visit        Thomasene Mohair PT, DPT Physical Therapist Acute Rehabilitation Services Office: [phone]   Janan Halter [place] 05/31/2023, 3:18 PM

## 2023-05-31 NOTE — Evaluation (Signed)
Occupational Therapy Evaluation Patient Details Name: Andrea Larson MRN: [id number] DOB: [birth year] Today's Date: 05/31/2023   History of Present Illness Andrea Larson is a 77 yr old female admitted to the hospital 05-29-23 with altered mental status. She was found to have acute metabolic encephalopathy, new onset afib, and possible L pyelonephritis. PMH: HTN, GAD, dementia, CKD 3, COPD, vitamin B12 deficiencey, HLD, chronic hypoxic respiratory failure, recent R femur fracture s/p hemiarthroplasty (anterior approach) on10-31-24   Clinical Impression   The pt is currently presenting with the below listed deficits, which compromise her ADL performance and overall functional independence (see OT problem list). At current, she requires assist for tasks including, lower body dressing, toileting, and functional transfers. During the session today, she reported having moderate low back and RLE pain with progressive activity. She was assisted out of bed and to the bedside chair. She required reassurance and reinforcement, as she was occasionally anxious and uncertain for her abilities. She will benefit from further OT services to maximize her safety and independence with self care tasks and to decrease the risk for restricted participation in meaningful activities. Patient will benefit from continued inpatient follow up therapy, <3 hours/day.       If plan is discharge home, recommend the following: Direct supervision/assist for medications management;A lot of help with walking and/or transfers;A lot of help with bathing/dressing/bathroom;Supervision due to cognitive status    Functional Status Assessment  Patient has had a recent decline in their functional status and demonstrates the ability to make significant improvements in function in a reasonable and predictable amount of time.  Equipment Recommendations  Other (comment) (defer to next level of care)    Recommendations for Other Services        Precautions / Restrictions Precautions Precautions: Fall Restrictions RLE Weight Bearing: Weight bearing as tolerated      Mobility Bed Mobility Overal bed mobility: Needs Assistance Bed Mobility: Supine to Sit     Supine to sit: Mod assist, HOB elevated, Used rails     General bed mobility comments: Pt required increased time and effort, as well as cues for transfer technique, including advancing BLE off the bed, and reaching for and pulling on bedrail. She required mod assist for supine to sit and max assist for scooting to the EOB    Transfers Overall transfer level: Needs assistance Equipment used: Rolling walker (2 wheels) Transfers: Sit to/from Stand Sit to Stand: Mod assist     Step pivot transfers: Mod assist     General transfer comment: She required instruction on RW placement and advancement, as well as reinforcement, as she was noted to be anxious and uncertain of her abilities      Balance       Sitting balance - Comments: Static sitting-good. Dynamic sitting-Fair+     Standing balance-Leahy Scale: Poor           ADL either performed or assessed with clinical judgement   ADL Overall ADL's : Needs assistance/impaired Eating/Feeding: Set up;Sitting Eating/Feeding Details (indicate cue type and reason): The pt was observed drinking water from a cup seated in the bedside chair. Grooming: Supervision/safety;Set up;Sitting Grooming Details (indicate cue type and reason): The pt performed face washing and teeth brushing seated in the bedside chair.             Lower Body Dressing: Maximal assistance Lower Body Dressing Details (indicate cue type and reason): She required assistance to don socks at bed level.     Toileting- Clothing  Manipulation and Hygiene: Moderate assistance;Maximal assistance;Sit to/from stand Toileting - Clothing Manipulation Details (indicate cue type and reason): At bedside commode level, based on clinical judgement              Vision   Additional Comments: The pt correctly read the time depicted on the wall clock.            Pertinent Vitals/Pain Pain Assessment Pain Assessment: Faces Pain Score: 5  Pain Location: low back and R hip Pain Intervention(s): Patient requesting pain meds-RN notified, Limited activity within patient's tolerance, Monitored during session     Extremity/Trunk Assessment Upper Extremity Assessment Upper Extremity Assessment: Right hand dominant;LUE deficits/detail;RUE deficits/detail RUE Deficits / Details: Chronic appearing shoulder ROM limitations with AROM for shoulder flexion being <90 degrees. Elbow and hand AROM WFL. Functional grip strength LUE Deficits / Details: AROM WFL. Functional grip strength           Communication Communication Communication: No apparent difficulties   Cognition Arousal: Alert Behavior During Therapy: Anxious Overall Cognitive Status: History of cognitive impairments - at baseline Area of Impairment: Memory, Problem solving          Orientation Level: Disoriented to, Situation             General Comments: Oriented to person, place, month, and year. Disoriented to situation. Impaired memory                Home Living Family/patient expects to be discharged to:: Assisted living        Additional Comments: The pt's medical chart indicates she resides at [facility]. The pt had difficulty recalling information regarding her living situation and prior level of functioning.      Prior Functioning/Environment Prior Level of Function : Patient poor historian/Family not available             Mobility Comments: The pt was unable to recall. ADLs Comments: The pt was unable to recall.        OT Problem List: Decreased strength;Decreased range of motion;Impaired balance (sitting and/or standing);Decreased cognition;Decreased activity tolerance;Decreased knowledge of use of DME or AE;Pain      OT  Treatment/Interventions: Self-care/ADL training;Therapeutic exercise;Energy conservation;DME and/or AE instruction;Cognitive remediation/compensation;Therapeutic activities;Balance training;Patient/family education    OT Goals(Current goals can be found in the care plan section) Acute Rehab OT Goals OT Goal Formulation: With patient Time For Goal Achievement: 06/14/23 Potential to Achieve Goals: Good ADL Goals Pt Will Perform Upper Body Dressing: with set-up;sitting Pt Will Transfer to Toilet: with contact guard assist;ambulating Pt Will Perform Toileting - Clothing Manipulation and hygiene: with contact guard assist;sit to/from stand Additional ADL Goal #1: The pt will perform bed mobility with CGA, in prep for progressive ADL participation.  OT Frequency: Min 1X/week       AM-PAC OT "6 Clicks" Daily Activity     Outcome Measure Help from another person eating meals?: None Help from another person taking care of personal grooming?: A Little Help from another person toileting, which includes using toliet, bedpan, or urinal?: A Lot Help from another person bathing (including washing, rinsing, drying)?: A Lot Help from another person to put on and taking off regular upper body clothing?: A Little Help from another person to put on and taking off regular lower body clothing?: A Lot 6 Click Score: 16   End of Session Equipment Utilized During Treatment: Rolling walker (2 wheels);Gait belt;Oxygen Nurse Communication: Patient requests pain meds;Mobility status  Activity Tolerance: Patient limited by pain Patient left:  in chair;with call bell/phone within reach;with chair alarm set  OT Visit Diagnosis: Unsteadiness on feet (R26.81);Muscle weakness (generalized) (M62.81)                Time: 4098-1191 OT Time Calculation (min): 23 min Charges:  OT General Charges $OT Visit: 1 Visit OT Evaluation $OT Eval Moderate Complexity: 1 Mod OT Treatments $Therapeutic Activity: 8-22  mins    Reuben Likes, OTR/L 05/31/2023, 1:11 PM

## 2023-05-31 NOTE — Progress Notes (Signed)
Date and time results received: 05/31/23 0644 (use smartphrase ".now" to insert current time)  Test: Diabetes Critical Value: Glucose = 30  Name of Provider Notified: NP on call Virgel Manifold  Orders Received? Or Actions Taken?:  waiting for new orders.

## 2023-05-31 NOTE — Progress Notes (Addendum)
PROGRESS NOTE    Andrea Larson  VHQ:[id number] DOB: Nov 30, 1945 DOA: 05/29/2023 PCP: Caesar Bookman, NP   Brief Narrative:  HPI:  Andrea Larson is a 77 y.o. female with medical history significant of essential hypertension, GAD, dementia, CKD stage IIIb, COPD, vitamin B12 deficiency,  hyperlipidemia, chronic hypoxic respiratory failure 2 L oxygen at bedtime and recent femoral fracture status post hip arthroplasty 05/29/2023 presented to emergency department from nursing home due to altered mentation.  Per nursing home report patient is alert but not completely oriented for last 2 days.  As well as patient is more lethargic.  Paramedics reported patient has unknown temperature at nursing facility.  Patient is a poor historian.  She denies any fever, chill, increasing urinary  urgency, frequency and dysuria.  However she is complaining about left-sided flank pain.  Did any chest pain, shortness of breath and palpitation.   Patient has been recently hospitalized 10/30 to 11/5 for mechanical fall and fracture of the right hip status post right sided hip arthroplasty.  Patient was discharged to nursing facility afterward.   ED Course:  At presentation to ED patient's vitals are hemodynamically stable.  Blood pressure 134/9012, heart rate 118 and O2 sat 97% on 3 L. CMP unremarkable.  Sodium 136, potassium 4.6, bicarb 28, blood glucose 101, creatinine 1.43, albumin 3.1 GFR 37 anion gap 9. CBC unremarkable with stable H&H.  Normal pro time and INR.  Normal APTT.  Blood cultures are pending.  Respiratory panel negative.  Lactic acid within normal range.  UA Strow appearance, increased specific gravity and ketone positive.   CT angiogram chest no acute pulmonary embolism, acute cardiopulmonary disease. CT abdomen normal finding.  Cholelithiasis.  No evidence of acute cholecystitis.  Periumbilical ventral hernia.  No bowel obstruction.  Left-sided pyelonephritis.   Head CT scan unremarkable.    ED physician reported that EMS reported patient had fever at nursing home however there is no actual record of the fever on the chart.  Dr. Suezanne Jacquet reported that patient has left-sided CVA tenderness.  At this point unclear etiology of source of infection.  However patient is afebrile and hemodynamically stable.  Normal lactic acid.  In the ED patient has been treated with sepsis protocol and clued IV bolus 2 L and currently on LR 150 cc/h, cefepime, metronidazole and vancomycin.  Pending urine culture and blood cultures.   Hospital list has been consulted for further evaluation management of altered mentation/acute metabolic encephalopathy/delirium and left-sided pyelonephritis.  Assessment & Plan:   Active Problems:   Acute metabolic encephalopathy   Pyelonephritis of left kidney   Hyperlipidemia   COPD (chronic obstructive pulmonary disease) (HCC)   Essential hypertension   Chronic kidney disease (CKD), stage III (moderate) (HCC)   Chronic hypoxic respiratory failure (HCC)   Dementia without behavioral disturbance (HCC)   GAD (generalized anxiety disorder)   Multifocal atrial tachycardia (HCC)   Mild aortic stenosis  Acute metabolic encephalopathy: Could be secondary to infection or worsening dementia however currently patient is fully alert and oriented.  CT head normal.  Ammonia normal.  Left-sided pyelonephritis At the time of admission, no leukocytosis, she was afebrile and sepsis was ruled out.  Interestingly, CT abdomen is consistent with possible left pyelonephritis but UA is not consistent with UTI.  She has been started on antibiotics for that.  On my examination, she did not have any CVA tenderness.  Urine culture is still in process, blood culture growing only staph epididymitis which is likely  contamination.  Continue current antibiotics.  New onset atrial fibrillation ruled out/MAT ruled in: After admission, patient was found to have possibly new onset atrial fibrillation  with RVR, cardiology consulted, they reviewed her EKG and opined that she does not have atrial fibrillation and instead she has MAT.  Currently in sinus rhythm.  Hypoglycemia: Patient had profound hypoglycemia with blood sugar of 21 this morning.  No history of diabetes, not on any antiglycemic medications.  Checking hemoglobin A1c and C-peptide.  Monitor closely.  History of right-sided hip fracture status post arthroplasty 11/24 Pain controlled on current medications, PT OT consulted.   CKD 3A Currently creatinine better than his baseline.   Hyperlipidemia -Continue Lipitor 20 mg daily   History of COPD Chronic hypoxic respiratory failure 2/3 L oxygen at baseline At baseline.  Continue home medications.   Dementia -Aricept 5 mg daily   Generalized anxiety disorder -Continue BuSpar 150 mg daily and ProAir 40 mg daily, holding Remeron in the setting of confusion/altered mentation.   Initial hypertension Intermittently soft blood pressure, continue to hold amlodipine.   Chronic constipation -Continue Colace 100 mg twice daily  DVT prophylaxis: enoxaparin (LOVENOX) injection 40 mg Start: 05/30/23 2200 SCDs Start: 05/29/23 2345   Code Status: Limited: Do not attempt resuscitation (DNR) -DNR-LIMITED -Do Not Intubate/DNI   Family Communication:  None present at bedside.  Plan of care discussed with patient in length and he/she verbalized understanding and agreed with it.  Discussed with her son Chrissie Noa over the phone.  Status is: Inpatient Remains inpatient appropriate because: Not back to baseline yet.  Hypoglycemic this morning.  Needs to be seen by PT OT.  May need SNF.   Estimated body mass index is 22.82 kg/m as calculated from the following:   Height as of this encounter: 5\' 2"  (1.575 m).   Weight as of this encounter: 56.6 kg.    Nutritional Assessment: Body mass index is 22.82 kg/m.Marland Kitchen Seen by dietician.  I agree with the assessment and plan as outlined  below: Nutrition Status:        . Skin Assessment: I have examined the patient's skin and I agree with the wound assessment as performed by the wound care RN as outlined below:    Consultants:  None  Procedures:  None  Antimicrobials:  Anti-infectives (From admission, onward)    Start     Dose/Rate Route Frequency Ordered Stop   05/30/23 0000  cefTRIAXone (ROCEPHIN) injection 2 g        2 g Intramuscular Every 24 hours 05/29/23 2346 06/04/23 2359   05/29/23 1530  ceFEPIme (MAXIPIME) 2 g in sodium chloride 0.9 % 100 mL IVPB        2 g 200 mL/hr over 30 Minutes Intravenous  Once 05/29/23 1519 05/29/23 1656   05/29/23 1530  metroNIDAZOLE (FLAGYL) IVPB 500 mg        500 mg 100 mL/hr over 60 Minutes Intravenous  Once 05/29/23 1519 05/29/23 1739   05/29/23 1530  vancomycin (VANCOCIN) IVPB 1000 mg/200 mL premix        1,000 mg 200 mL/hr over 60 Minutes Intravenous  Once 05/29/23 1519 05/29/23 1907         Subjective: Patient seen and examined.  She is complaining of lower midline back pain.  This is acute on chronic pain.  No other complaint.  She is fully alert and oriented.  Objective: Vitals:   05/30/23 2024 05/31/23 0021 05/31/23 0418 05/31/23 0719  BP: (!) 100/50 101/73 121/69  Pulse: 83 77 94   Resp: 18 17 18    Temp: 97.8 F (36.6 C) 98.3 F (36.8 C) 98.5 F (36.9 C)   TempSrc: Oral Oral Oral   SpO2: 100% 100% 96% 98%  Weight:      Height:       No intake or output data in the 24 hours ending 05/31/23 0755 Filed Weights   05/30/23 1926  Weight: 56.6 kg    Examination:  General exam: Appears calm and comfortable  Respiratory system: Clear to auscultation. Respiratory effort normal. Cardiovascular system: S1 & S2 heard, RRR. No JVD, murmurs, rubs, gallops or clicks. No pedal edema. Gastrointestinal system: Abdomen is nondistended, soft and nontender. No organomegaly or masses felt. Normal bowel sounds heard. Central nervous system: Alert and oriented.  No focal neurological deficits. Extremities: Symmetric 5 x 5 power. Skin: No rashes, lesions or ulcers.  Psychiatry: Judgement and insight appear normal. Mood & affect appropriate.   Data Reviewed: I have personally reviewed following labs and imaging studies  CBC: Recent Labs  Lab 05/29/23 1528 05/29/23 1535 05/30/23 0500 05/31/23 0550  WBC 8.8  --  11.7* 8.4  NEUTROABS 7.0  --   --   --   HGB 9.7* 10.5* 9.2* 8.4*  HCT 31.3* 31.0* 30.6* 29.2*  MCV 96.6  --  97.5 101.0*  PLT 449*  --  418* 350   Basic Metabolic Panel: Recent Labs  Lab 05/29/23 1528 05/29/23 1535 05/30/23 0500 05/31/23 0550  NA 136 138 137 139  K 4.6 4.7 3.9 4.1  CL 99 100 103 104  CO2 28  --  25 21*  GLUCOSE 101* 100* 86 30*  BUN 20 19 12 14   CREATININE 1.43* 1.50* 0.95 0.90  CALCIUM 8.4*  --  8.2* 8.3*   GFR: Estimated Creatinine Clearance: 40.1 mL/min (by C-G formula based on SCr of 0.9 mg/dL). Liver Function Tests: Recent Labs  Lab 05/29/23 1528 05/30/23 0500 05/31/23 0550  AST 21 25 21   ALT 14 14 13   ALKPHOS 177* 172* 144*  BILITOT 0.4 0.4 0.9  PROT 6.7 6.3* 5.5*  ALBUMIN 3.1* 2.8* 2.5*   No results for input(s): "LIPASE", "AMYLASE" in the last 168 hours. Recent Labs  Lab 05/30/23 0500  AMMONIA 26   Coagulation Profile: Recent Labs  Lab 05/29/23 1528  INR 1.0   Cardiac Enzymes: No results for input(s): "CKTOTAL", "CKMB", "CKMBINDEX", "TROPONINI" in the last 168 hours. BNP (last 3 results) No results for input(s): "PROBNP" in the last 8760 hours. HbA1C: No results for input(s): "HGBA1C" in the last 72 hours. CBG: Recent Labs  Lab 05/31/23 0649 05/31/23 0719 05/31/23 0748  GLUCAP 21* 69* 105*   Lipid Profile: No results for input(s): "CHOL", "HDL", "LDLCALC", "TRIG", "CHOLHDL", "LDLDIRECT" in the last 72 hours. Thyroid Function Tests: No results for input(s): "TSH", "T4TOTAL", "FREET4", "T3FREE", "THYROIDAB" in the last 72 hours. Anemia Panel: No results for  input(s): "VITAMINB12", "FOLATE", "FERRITIN", "TIBC", "IRON", "RETICCTPCT" in the last 72 hours. Sepsis Labs: Recent Labs  Lab 05/29/23 1535  LATICACIDVEN 0.9    Recent Results (from the past 240 hour(s))  Blood Culture (routine x 2)     Status: None (Preliminary result)   Collection Time: 05/29/23  3:28 PM   Specimen: BLOOD RIGHT ARM  Result Value Ref Range Status   Specimen Description   Final    BLOOD RIGHT ARM Performed at [facility], [address], [place], [place] [address]    Special Requests  Final    BOTTLES DRAWN AEROBIC AND ANAEROBIC Blood Culture adequate volume Performed at [facility], [address] [place], [place] [address]    Culture  Setup Time   Final    GRAM POSITIVE COCCI ANAEROBIC BOTTLE ONLY CRITICAL RESULT CALLED TO, READ BACK BY AND VERIFIED WITH: PHARMD JUSTIN LEGGE ON 05/30/23 @ 1746 BY DRT Performed at [facility], [address], [place], [place] [address]    Culture GRAM POSITIVE COCCI  Final   Report Status PENDING  Incomplete  Blood Culture ID Panel (Reflexed)     Status: Abnormal   Collection Time: 05/29/23  3:28 PM  Result Value Ref Range Status   Enterococcus faecalis NOT DETECTED NOT DETECTED Final   Enterococcus Faecium NOT DETECTED NOT DETECTED Final   Listeria monocytogenes NOT DETECTED NOT DETECTED Final   Staphylococcus species DETECTED (A) NOT DETECTED Final    Comment: CRITICAL RESULT CALLED TO, READ BACK BY AND VERIFIED WITH: PHARMD JUSTIN LEGGE ON 05/30/23 @ 1746 BY DRT    Staphylococcus aureus (BCID) NOT DETECTED NOT DETECTED Final   Staphylococcus epidermidis DETECTED (A) NOT DETECTED Final    Comment: Methicillin (oxacillin) resistant coagulase negative staphylococcus. Possible blood culture contaminant (unless isolated from more than one blood culture draw or clinical case suggests pathogenicity). No antibiotic treatment is indicated for blood  culture contaminants. CRITICAL RESULT  CALLED TO, READ BACK BY AND VERIFIED WITH: PHARMD JUSTIN LEGGE ON 05/30/23 @ 1746 BY DRT    Staphylococcus lugdunensis NOT DETECTED NOT DETECTED Final   Streptococcus species NOT DETECTED NOT DETECTED Final   Streptococcus agalactiae NOT DETECTED NOT DETECTED Final   Streptococcus pneumoniae NOT DETECTED NOT DETECTED Final   Streptococcus pyogenes NOT DETECTED NOT DETECTED Final   A.calcoaceticus-baumannii NOT DETECTED NOT DETECTED Final   Bacteroides fragilis NOT DETECTED NOT DETECTED Final   Enterobacterales NOT DETECTED NOT DETECTED Final   Enterobacter cloacae complex NOT DETECTED NOT DETECTED Final   Escherichia coli NOT DETECTED NOT DETECTED Final   Klebsiella aerogenes NOT DETECTED NOT DETECTED Final   Klebsiella oxytoca NOT DETECTED NOT DETECTED Final   Klebsiella pneumoniae NOT DETECTED NOT DETECTED Final   Proteus species NOT DETECTED NOT DETECTED Final   Salmonella species NOT DETECTED NOT DETECTED Final   Serratia marcescens NOT DETECTED NOT DETECTED Final   Haemophilus influenzae NOT DETECTED NOT DETECTED Final   Neisseria meningitidis NOT DETECTED NOT DETECTED Final   Pseudomonas aeruginosa NOT DETECTED NOT DETECTED Final   Stenotrophomonas maltophilia NOT DETECTED NOT DETECTED Final   Candida albicans NOT DETECTED NOT DETECTED Final   Candida auris NOT DETECTED NOT DETECTED Final   Candida glabrata NOT DETECTED NOT DETECTED Final   Candida krusei NOT DETECTED NOT DETECTED Final   Candida parapsilosis NOT DETECTED NOT DETECTED Final   Candida tropicalis NOT DETECTED NOT DETECTED Final   Cryptococcus neoformans/gattii NOT DETECTED NOT DETECTED Final   Methicillin resistance mecA/C DETECTED (A) NOT DETECTED Final    Comment: CRITICAL RESULT CALLED TO, READ BACK BY AND VERIFIED WITH: PHARMD JUSTIN LEGGE ON 05/30/23 @ 1746 BY DRT Performed at [facility], [address], [place], [place] [address]   Resp panel by RT-PCR (RSV, Flu A&B, Covid) Anterior Nasal Swab      Status: None   Collection Time: 05/29/23  3:29 PM   Specimen: Anterior Nasal Swab  Result Value Ref Range Status   SARS Coronavirus 2 by RT PCR NEGATIVE NEGATIVE Final    Comment: (  NOTE) SARS-CoV-2 target nucleic acids are NOT DETECTED.  The SARS-CoV-2 RNA is generally detectable in upper respiratory specimens during the acute phase of infection. The lowest concentration of SARS-CoV-2 viral copies this assay can detect is 138 copies/mL. A negative result does not preclude SARS-Cov-2 infection and should not be used as the sole basis for treatment or other patient management decisions. A negative result may occur with  improper specimen collection/handling, submission of specimen other than nasopharyngeal swab, presence of viral mutation(s) within the areas targeted by this assay, and inadequate number of viral copies(<138 copies/mL). A negative result must be combined with clinical observations, patient history, and epidemiological information. The expected result is Negative.  Fact Sheet for Patients:  BloggerCourse.com  Fact Sheet for Healthcare Providers:  SeriousBroker.it  This test is no t yet approved or cleared by the [place] FDA and  has been authorized for detection and/or diagnosis of SARS-CoV-2 by FDA under an Emergency Use Authorization (EUA). This EUA will remain  in effect (meaning this test can be used) for the duration of the COVID-19 declaration under Section 564(b)(1) of the Act, 21 U.S.C.section 360bbb-3(b)(1), unless the authorization is terminated  or revoked sooner.       Influenza A by PCR NEGATIVE NEGATIVE Final   Influenza B by PCR NEGATIVE NEGATIVE Final    Comment: (NOTE) The Xpert Xpress SARS-CoV-2/FLU/RSV plus assay is intended as an aid in the diagnosis of influenza from Nasopharyngeal swab specimens and should not be used as a sole basis for treatment. Nasal washings and aspirates are  unacceptable for Xpert Xpress SARS-CoV-2/FLU/RSV testing.  Fact Sheet for Patients: BloggerCourse.com  Fact Sheet for Healthcare Providers: SeriousBroker.it  This test is not yet approved or cleared by the [place] FDA and has been authorized for detection and/or diagnosis of SARS-CoV-2 by FDA under an Emergency Use Authorization (EUA). This EUA will remain in effect (meaning this test can be used) for the duration of the COVID-19 declaration under Section 564(b)(1) of the Act, 21 U.S.C. section 360bbb-3(b)(1), unless the authorization is terminated or revoked.     Resp Syncytial Virus by PCR NEGATIVE NEGATIVE Final    Comment: (NOTE) Fact Sheet for Patients: BloggerCourse.com  Fact Sheet for Healthcare Providers: SeriousBroker.it  This test is not yet approved or cleared by the [place] FDA and has been authorized for detection and/or diagnosis of SARS-CoV-2 by FDA under an Emergency Use Authorization (EUA). This EUA will remain in effect (meaning this test can be used) for the duration of the COVID-19 declaration under Section 564(b)(1) of the Act, 21 U.S.C. section 360bbb-3(b)(1), unless the authorization is terminated or revoked.  Performed at [facility], [address], [place], [place] [address]   Blood Culture (routine x 2)     Status: None (Preliminary result)   Collection Time: 05/29/23  3:43 PM   Specimen: BLOOD  Result Value Ref Range Status   Specimen Description   Final    BLOOD BLOOD LEFT HAND Performed at [facility], [address], [place], [place] [address]    Special Requests   Final    BOTTLES DRAWN AEROBIC AND ANAEROBIC Blood Culture adequate volume Performed at [facility], [address], [place], [place] [address]    Culture   Final    NO GROWTH < 12 HOURS Performed at [facility], [address], [place], [place] [address]    Report Status PENDING  Incomplete  Radiology Studies: ECHOCARDIOGRAM COMPLETE  Result Date: 05/30/2023    ECHOCARDIOGRAM REPORT   Patient Name:   MACKENZYE OSTIGUY Date of Exam: 05/30/2023 Medical Rec #:  [id number]         Height:       62.0 in Accession #:    [id number]        Weight:       124.8 lb Date of Birth:  01-Feb-1946         BSA:          1.564 m Patient Age:    77 years          BP:           111/62 mmHg Patient Gender: F                 HR:           103 bpm. Exam Location:  Inpatient Procedure: 2D Echo, Cardiac Doppler and Color Doppler Indications:    a flutter  History:        Patient has prior history of Echocardiogram examinations, most                 recent 11/27/2019. CAD, COPD, Aortic Valve Disease; Risk                 Factors:Hypertension.  Sonographer:    Harriette Bouillon Referring Phys: [id number] SUBRINA SUNDIL  Sonographer Comments: Technically challenging study due to limited acoustic windows. IMPRESSIONS  1. Left ventricular ejection fraction, by estimation, is 60 to 65%. The left ventricle has normal function. The left ventricle has no regional wall motion abnormalities. Left ventricular diastolic function could not be evaluated.  2. Right ventricular systolic function is normal. The right ventricular size is normal.  3. The mitral valve is degenerative. No evidence of mitral valve regurgitation. Moderate mitral annular calcification.  4. The aortic valve was not well visualized. Aortic valve regurgitation is not visualized. Moderate aortic valve stenosis. Aortic valve mean gradient measures 14.0 mmHg. Aortic valve Vmax measures 2.39 m/s. DVI 0.3 and low SVI of 19. 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  5. The inferior vena cava is normal in size with greater than 50% respiratory variability, suggesting right atrial pressure of 3 mmHg. FINDINGS  Left Ventricle: Left ventricular  ejection fraction, by estimation, is 60 to 65%. The left ventricle has normal function. The left ventricle has no regional wall motion abnormalities. The left ventricular internal cavity size was normal in size. There is  no left ventricular hypertrophy. Left ventricular diastolic function could not be evaluated. Right Ventricle: The right ventricular size is normal. No increase in right ventricular wall thickness. Right ventricular systolic function is normal. Left Atrium: Left atrial size was normal in size. Right Atrium: Right atrial size was normal in size. Pericardium: There is no evidence of pericardial effusion. Mitral Valve: The mitral valve is degenerative in appearance. There is moderate thickening of the mitral valve leaflet(s). There is mild calcification of the mitral valve leaflet(s). Moderate mitral annular calcification. No evidence of mitral valve regurgitation. Tricuspid Valve: The tricuspid valve is normal in structure. Tricuspid valve regurgitation is not demonstrated. No evidence of tricuspid stenosis. Aortic Valve: The aortic valve was not well visualized. Aortic valve regurgitation is not visualized. Moderate aortic stenosis is present. Aortic valve mean gradient measures 14.0 mmHg. Aortic valve peak gradient measures 22.9 mmHg. Aortic valve area, by  VTI measures 0.59 cm. Pulmonic Valve: The  pulmonic valve was normal in structure. Pulmonic valve regurgitation is not visualized. No evidence of pulmonic stenosis. Aorta: The aortic root is normal in size and structure. Venous: The inferior vena cava is normal in size with greater than 50% respiratory variability, suggesting right atrial pressure of 3 mmHg. IAS/Shunts: No atrial level shunt detected by color flow Doppler.  LEFT VENTRICLE PLAX 2D LVIDd:         3.80 cm LVIDs:         2.50 cm LV PW:         1.00 cm LV IVS:        1.00 cm LVOT diam:     1.60 cm LV SV:         29 LV SV Index:   19 LVOT Area:     2.01 cm  IVC IVC diam: 1.80 cm LEFT  ATRIUM         Index LA diam:    3.30 cm 2.11 cm/m  AORTIC VALVE AV Area (Vmax):    0.56 cm AV Area (Vmean):   0.55 cm AV Area (VTI):     0.59 cm AV Vmax:           239.20 cm/s AV Vmean:          171.200 cm/s AV VTI:            0.491 m AV Peak Grad:      22.9 mmHg AV Mean Grad:      14.0 mmHg LVOT Vmax:         67.00 cm/s LVOT Vmean:        47.000 cm/s LVOT VTI:          0.145 m LVOT/AV VTI ratio: 0.30  AORTA Ao Root diam: 2.60 cm  SHUNTS Systemic VTI:  0.14 m Systemic Diam: 1.60 cm Armanda Magic MD Electronically signed by Armanda Magic MD Signature Date/Time: 05/30/2023/3:02:36 PM    Final    CT Head Wo Contrast  Result Date: 05/29/2023 CLINICAL DATA:  Altered mental status. EXAM: CT HEAD WITHOUT CONTRAST TECHNIQUE: Contiguous axial images were obtained from the base of the skull through the vertex without intravenous contrast. RADIATION DOSE REDUCTION: This exam was performed according to the departmental dose-optimization program which includes automated exposure control, adjustment of the mA and/or kV according to patient size and/or use of iterative reconstruction technique. COMPARISON:  Head CT dated 04/07/2021. FINDINGS: Brain: Mild age-related atrophy and chronic microvascular ischemic changes. There is no acute intracranial hemorrhage. No mass effect or midline shift. No extra-axial fluid collection. Vascular: No hyperdense vessel or unexpected calcification. Skull: Normal. Negative for fracture or focal lesion. Sinuses/Orbits: Mild mucoperiosteal thickening of paranasal sinuses. The mastoid air cells are clear. Other: None IMPRESSION: 1. No acute intracranial pathology. 2. Mild age-related atrophy and chronic microvascular ischemic changes. Electronically Signed   By: Elgie Collard M.D.   On: 05/29/2023 23:36   CT ABDOMEN PELVIS W CONTRAST  Addendum Date: 05/29/2023   ADDENDUM REPORT: 05/29/2023 19:21 ADDENDUM: After further review and comparison to today's chest CT, there are questionable  areas of decreased perfusion in the left kidney, mostly upper and lower poles, but there is extensive respiratory motion which limits evaluation. Recommend clinical correlation for possible pyelonephritis. Electronically Signed   By: Charlett Nose M.D.   On: 05/29/2023 19:21   Result Date: 05/29/2023 CLINICAL DATA:  Abdominal pain, acute, nonlocalized EXAM: CT ABDOMEN AND PELVIS WITH CONTRAST TECHNIQUE: Multidetector CT imaging of the abdomen and pelvis was  performed using the standard protocol following bolus administration of intravenous contrast. RADIATION DOSE REDUCTION: This exam was performed according to the departmental dose-optimization program which includes automated exposure control, adjustment of the mA and/or kV according to patient size and/or use of iterative reconstruction technique. CONTRAST:  80mL OMNIPAQUE IOHEXOL 350 MG/ML SOLN COMPARISON:  03/27/2019 FINDINGS: Lower chest: No acute abnormality. Coronary artery disease, aortic atherosclerosis. Hepatobiliary: Small layering gallstones within the gallbladder. No focal hepatic abnormality or biliary ductal dilatation. Pancreas: No focal abnormality or ductal dilatation. Spleen: No focal abnormality.  Normal size. Adrenals/Urinary Tract: No adrenal abnormality. No focal renal abnormality. No stones or hydronephrosis. Urinary bladder decompressed. Stomach/Bowel: Sigmoid diverticulosis. No active diverticulitis. Stomach and small bowel decompressed, unremarkable. Vascular/Lymphatic: Diffuse aortoiliac atherosclerosis. No evidence of aneurysm or adenopathy. Reproductive: Uterus and adnexa unremarkable.  No mass. Other: No free fluid or free air. Periumbilical hernias noted containing fat on the left and fat and a portion of the anterior wall of the transverse colon on the right. No bowel obstruction. Musculoskeletal: No acute bony abnormality. IMPRESSION: No acute findings in the abdomen or pelvis. Diffuse aortoiliac atherosclerosis, coronary artery  disease. Cholelithiasis.  No evidence of acute cholecystitis. Periumbilical ventral hernias containing fat and anterior wall of the transverse colon. No bowel obstruction. Electronically Signed: By: Charlett Nose M.D. On: 05/29/2023 19:13   CT Angio Chest PE W and/or Wo Contrast  Result Date: 05/29/2023 CLINICAL DATA:  Pulmonary embolism (PE) suspected, high prob. Altered mental status. EXAM: CT ANGIOGRAPHY CHEST WITH CONTRAST TECHNIQUE: Multidetector CT imaging of the chest was performed using the standard protocol during bolus administration of intravenous contrast. Multiplanar CT image reconstructions and MIPs were obtained to evaluate the vascular anatomy. RADIATION DOSE REDUCTION: This exam was performed according to the departmental dose-optimization program which includes automated exposure control, adjustment of the mA and/or kV according to patient size and/or use of iterative reconstruction technique. CONTRAST:  80mL OMNIPAQUE IOHEXOL 350 MG/ML SOLN COMPARISON:  01/08/2023 FINDINGS: Cardiovascular: No filling defects in the pulmonary arteries to suggest pulmonary emboli. Heart is normal size. Aorta is normal caliber. Coronary artery and aortic atherosclerosis. Mediastinum/Nodes: No mediastinal, hilar, or axillary adenopathy. Trachea and esophagus are unremarkable. Thyroid unremarkable. Lungs/Pleura: No confluent opacities or effusions. Upper Abdomen: No acute findings Musculoskeletal: No acute bony abnormality. Numerous moderate to severe compression fractures in the midthoracic spine, unchanged since prior study. Review of the MIP images confirms the above findings. IMPRESSION: No evidence of pulmonary embolus. No acute cardiopulmonary disease. Coronary artery disease. Aortic Atherosclerosis (ICD10-I70.0). Electronically Signed   By: Charlett Nose M.D.   On: 05/29/2023 19:17   DG Chest Port 1 View  Result Date: 05/29/2023 CLINICAL DATA:  Altered mental status. EXAM: PORTABLE CHEST 1 VIEW  COMPARISON:  May 09, 2023. FINDINGS: The heart size and mediastinal contours are within normal limits. Both lungs are clear. The visualized skeletal structures are unremarkable. IMPRESSION: No active disease. Electronically Signed   By: Lupita Raider M.D.   On: 05/29/2023 18:11    Scheduled Meds:  aspirin  81 mg Oral BID WC   buPROPion  150 mg Oral Daily   cefTRIAXone (ROCEPHIN) IM  2 g Intramuscular Q24H   docusate sodium  100 mg Oral BID   donepezil  5 mg Oral QHS   enoxaparin (LOVENOX) injection  40 mg Subcutaneous QHS   FLUoxetine  40 mg Oral Daily   fluticasone  2 spray Each Nare Daily   fluticasone furoate-vilanterol  1 puff Inhalation Daily  And   umeclidinium bromide  1 puff Inhalation Daily   ipratropium  0.5 mg Nebulization TID   memantine  10 mg Oral BID   montelukast  10 mg Oral Daily   pantoprazole  40 mg Oral Daily   rosuvastatin  20 mg Oral Daily   thiamine  100 mg Oral Daily   Continuous Infusions:   LOS: 2 days   Hughie Closs, MD [facility]  05/31/2023, 7:55 AM   *Please note that this is a verbal dictation therefore any spelling or grammatical errors are due to the "Dragon Medical One" system interpretation.  Please page via Amion and do not message via secure chat for urgent patient care matters. Secure chat can be used for non urgent patient care matters.  How to contact the [facility] Attending or Consulting provider 7A - 7P or covering provider during after hours 7P -7A, for this patient?  Check the care team in [facility] and look for a) attending/consulting TRH provider listed and b) the [facility] team listed. Page or secure chat 7A-7P. Log into [email or web address] and use [place]'s universal password to access. If you do not have the password, please contact the hospital operator. Locate the [facility] provider you are looking for under [facility] and page to a number that you can be directly reached. If you still have difficulty reaching the provider, please  page the [facility] (Director on Call) for the Hospitalists listed on amion for assistance.

## 2023-05-31 NOTE — Plan of Care (Signed)
  Problem: Education: Goal: Knowledge of General Education information will improve Description: Including pain rating scale, medication(s)/side effects and non-pharmacologic comfort measures Outcome: Progressing   Problem: Health Behavior/Discharge Planning: Goal: Ability to manage health-related needs will improve Outcome: Progressing   Problem: Clinical Measurements: Goal: Ability to maintain clinical measurements within normal limits will improve Outcome: Progressing Goal: Diagnostic test results will improve Outcome: Progressing   Problem: Activity: Goal: Risk for activity intolerance will decrease Outcome: Progressing   

## 2023-06-01 DIAGNOSIS — N12 Tubulo-interstitial nephritis, not specified as acute or chronic: Secondary | ICD-10-CM | POA: Diagnosis not present

## 2023-06-01 DIAGNOSIS — G9341 Metabolic encephalopathy: Secondary | ICD-10-CM | POA: Diagnosis not present

## 2023-06-01 LAB — GLUCOSE, CAPILLARY
Glucose-Capillary: 101 mg/dL — ABNORMAL HIGH (ref 70–99)
Glucose-Capillary: 104 mg/dL — ABNORMAL HIGH (ref 70–99)
Glucose-Capillary: 117 mg/dL — ABNORMAL HIGH (ref 70–99)
Glucose-Capillary: 89 mg/dL (ref 70–99)
Glucose-Capillary: 90 mg/dL (ref 70–99)
Glucose-Capillary: 96 mg/dL (ref 70–99)

## 2023-06-01 LAB — CULTURE, BLOOD (ROUTINE X 2): Special Requests: ADEQUATE

## 2023-06-01 LAB — COMPREHENSIVE METABOLIC PANEL
ALT: 12 U/L (ref 0–44)
AST: 18 U/L (ref 15–41)
Albumin: 2.3 g/dL — ABNORMAL LOW (ref 3.5–5.0)
Alkaline Phosphatase: 125 U/L (ref 38–126)
Anion gap: 6 (ref 5–15)
BUN: 12 mg/dL (ref 8–23)
CO2: 29 mmol/L (ref 22–32)
Calcium: 7.9 mg/dL — ABNORMAL LOW (ref 8.9–10.3)
Chloride: 103 mmol/L (ref 98–111)
Creatinine, Ser: 0.8 mg/dL (ref 0.44–1.00)
GFR, Estimated: >60 mL/min (ref >60)
Glucose, Bld: 97 mg/dL (ref 70–99)
Potassium: 3.3 mmol/L — ABNORMAL LOW (ref 3.5–5.1)
Sodium: 138 mmol/L (ref 135–145)
Total Bilirubin: 0.4 mg/dL (ref <1.2)
Total Protein: 5.3 g/dL — ABNORMAL LOW (ref 6.5–8.1)

## 2023-06-01 LAB — CBC
HCT: 27.5 % — ABNORMAL LOW (ref 36.0–46.0)
Hemoglobin: 8.1 g/dL — ABNORMAL LOW (ref 12.0–15.0)
MCH: 29.2 pg (ref 26.0–34.0)
MCHC: 29.5 g/dL — ABNORMAL LOW (ref 30.0–36.0)
MCV: 99.3 fL (ref 80.0–100.0)
Platelets: 307 10*3/uL (ref 150–400)
RBC: 2.77 MIL/uL — ABNORMAL LOW (ref 3.87–5.11)
RDW: 14.9 % (ref 11.5–15.5)
WBC: 6.2 10*3/uL (ref 4.0–10.5)
nRBC: 0 % (ref 0.0–0.2)

## 2023-06-01 LAB — C-PEPTIDE: C-Peptide: 0.8 ng/mL — ABNORMAL LOW (ref 1.1–4.4)

## 2023-06-01 MED ORDER — POTASSIUM CHLORIDE 20 MEQ PO PACK
40.0000 meq | PACK | Freq: Once | ORAL | Status: AC
  Administered 2023-06-01: 40 meq via ORAL
  Filled 2023-06-01: qty 2

## 2023-06-01 MED ORDER — SULFAMETHOXAZOLE-TRIMETHOPRIM 800-160 MG PO TABS
1.0000 | ORAL_TABLET | Freq: Two times a day (BID) | ORAL | Status: DC
  Administered 2023-06-01 – 2023-06-03 (×5): 1 via ORAL
  Filled 2023-06-01 (×5): qty 1

## 2023-06-01 NOTE — [place] FL2 (Signed)
[place] MEDICAID FL2 LEVEL OF CARE FORM     IDENTIFICATION  Patient Name: Andrea Larson Birthdate: Sep 21, 1945 Sex: female Admission Date (Current Location): 05/29/2023  [facility] and [place] Number:  Producer, television/film/video and Address:  [facility],  [address], [place] [address]      Provider Number: [id number]  Attending Physician Name and Address:  Hughie Closs, MD  Relative Name and Phone Number:  Chyrel Masson (son) [phone]    Current Level of Care: Hospital Recommended Level of Care: Skilled Nursing Facility Prior Approval Number:    Date Approved/Denied:   PASRR Number: [id number]  Discharge Plan: SNF    Current Diagnoses: Patient Active Problem List   Diagnosis Date Noted   Multifocal atrial tachycardia (HCC) 05/30/2023   Mild aortic stenosis 05/30/2023   Acute metabolic encephalopathy 05/29/2023   Pyelonephritis of left kidney 05/29/2023   Closed fracture of multiple pubic rami, right, initial encounter (HCC) 05/08/2023   Closed displaced fracture of right femoral neck (HCC) 05/08/2023   History of COPD 05/08/2023   GAD (generalized anxiety disorder) 05/08/2023   Fall at nursing home. 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   Dysphagia    Protein-calorie malnutrition, severe 04/10/2021   Aortic atherosclerosis (HCC) 04/04/2021   Depression with anxiety    History of colon polyps 10/17/2020   Vitamin D deficiency 10/17/2020   Dementia without behavioral disturbance (HCC) 08/09/2020   Gait abnormality 08/09/2020   Chronic hypoxic respiratory failure (HCC) 04/10/2020   Peripheral edema 05/21/2018   Gait disorder 05/21/2018   Chronic kidney disease (CKD), stage III (moderate) (HCC) 04/28/2018   Malnutrition of moderate degree 04/13/2018   Essential hypertension 04/11/2018   Gallstones 04/11/2018    Coronary artery calcification 08/23/2016   Aortic stenosis 08/23/2016   Insomnia 08/18/2015   GERD (gastroesophageal reflux disease) 08/18/2015   Back pain 10/28/2012   Asthma 09/21/2011   Allergic rhinitis 09/21/2011   Colon polyps 09/21/2011   Encounter for well adult exam with abnormal findings 09/15/2011   Spondylosis 10/21/2008   Overweight 02/10/2008   ANXIETY DEPRESSION 02/10/2008   COPD (chronic obstructive pulmonary disease) (HCC) 02/10/2008   EMPHYSEMA NEC 01/30/2007   Hyperlipidemia 12/31/2006    Orientation RESPIRATION BLADDER Height & Weight      (disoriented to person, disoriented to place)  O2 External catheter Weight: 56.7 kg Height:  5\' 2"  (157.5 cm)  BEHAVIORAL SYMPTOMS/MOOD NEUROLOGICAL BOWEL NUTRITION STATUS      Incontinent Diet (heart healthy)  AMBULATORY STATUS COMMUNICATION OF NEEDS Skin   Limited Assist Verbally Skin abrasions, Other (Comment) (abrasion left hip; ecchymosis bilateral arms, legs, and lower buttocks)                       Personal Care Assistance Level of Assistance  Bathing, Feeding, Dressing Bathing Assistance: Limited assistance Feeding assistance: Independent Dressing Assistance: Limited assistance     Functional Limitations Info  Sight, Hearing, Speech Sight Info: Impaired Hearing Info: Impaired Speech Info: Adequate    SPECIAL CARE FACTORS FREQUENCY  PT (By licensed PT), OT (By licensed OT)     PT Frequency: 5x/week OT Frequency: 5x/week            Contractures Contractures Info: Not present    Additional Factors Info  Code Status, Allergies, Psychotropic Code Status Info: DNR Allergies Info: Lipitor (Atorvastatin) Psychotropic Info: See [facility]  Current Medications (06/01/2023):  This is the current hospital active medication list Current Facility-Administered Medications  Medication Dose Route Frequency Provider Last Rate Last Admin   acetaminophen (TYLENOL) tablet 650 mg  650 mg Oral Q6H PRN  Janalyn Shy, Subrina, MD   650 mg at 05/31/23 2126   Or   acetaminophen (TYLENOL) suppository 650 mg  650 mg Rectal Q6H PRN Janalyn Shy, Subrina, MD       aspirin chewable tablet 81 mg  81 mg Oral BID WC Sundil, Subrina, MD   81 mg at 06/01/23 1554   buPROPion (WELLBUTRIN XL) 24 hr tablet 150 mg  150 mg Oral Daily Sundil, Subrina, MD   150 mg at 06/01/23 0847   docusate sodium (COLACE) capsule 100 mg  100 mg Oral BID Janalyn Shy, Subrina, MD   100 mg at 06/01/23 0847   donepezil (ARICEPT) tablet 5 mg  5 mg Oral QHS Sundil, Subrina, MD   5 mg at 05/31/23 2126   enoxaparin (LOVENOX) injection 40 mg  40 mg Subcutaneous QHS Sundil, Subrina, MD   40 mg at 05/31/23 2126   FLUoxetine (PROZAC) capsule 40 mg  40 mg Oral Daily Sundil, Subrina, MD   40 mg at 06/01/23 0847   fluticasone (FLONASE) 50 MCG/ACT nasal spray 2 spray  2 spray Each Nare Daily Sundil, Subrina, MD   2 spray at 06/01/23 0810   fluticasone furoate-vilanterol (BREO ELLIPTA) 200-25 MCG/ACT 1 puff  1 puff Inhalation Daily Sundil, Subrina, MD   1 puff at 06/01/23 3875   And   umeclidinium bromide (INCRUSE ELLIPTA) 62.5 MCG/ACT 1 puff  1 puff Inhalation Daily Sundil, Subrina, MD   1 puff at 06/01/23 0828   ipratropium (ATROVENT) nebulizer solution 0.5 mg  0.5 mg Nebulization TID Sundil, Subrina, MD   0.5 mg at 06/01/23 1353   levalbuterol (XOPENEX) nebulizer solution 0.63 mg  0.63 mg Nebulization Q6H PRN Janalyn Shy, Subrina, MD       memantine [facility]) tablet 10 mg  10 mg Oral BID Janalyn Shy, Subrina, MD   10 mg at 06/01/23 0846   metoprolol tartrate (LOPRESSOR) injection 2.5 mg  2.5 mg Intravenous Q5 min PRN Janalyn Shy, Subrina, MD   2.5 mg at 05/30/23 0643   montelukast (SINGULAIR) tablet 10 mg  10 mg Oral Daily Sundil, Subrina, MD   10 mg at 06/01/23 0847   ondansetron (ZOFRAN) tablet 4 mg  4 mg Oral Q6H PRN Janalyn Shy, Subrina, MD       Or   ondansetron [facility]) injection 4 mg  4 mg Intravenous Q6H PRN Janalyn Shy, Subrina, MD       oxyCODONE (Oxy IR/ROXICODONE)  immediate release tablet 5 mg  5 mg Oral Q6H PRN Hughie Closs, MD   5 mg at 06/01/23 1356   pantoprazole (PROTONIX) EC tablet 40 mg  40 mg Oral Daily Sundil, Subrina, MD   40 mg at 06/01/23 0847   rosuvastatin (CRESTOR) tablet 20 mg  20 mg Oral Daily Sundil, Subrina, MD   20 mg at 06/01/23 0847   sulfamethoxazole-trimethoprim (BACTRIM DS) 800-160 MG per tablet 1 tablet  1 tablet Oral Q12H Hughie Closs, MD   1 tablet at 06/01/23 1554   thiamine (VITAMIN B1) tablet 100 mg  100 mg Oral Daily Sundil, Subrina, MD   100 mg at 06/01/23 6433     Discharge Medications: Please see discharge summary for a list of discharge medications.  Relevant Imaging Results:  Relevant Lab Results:   Additional Information SSN [id number]  Adrian Prows, RN

## 2023-06-01 NOTE — TOC Progression Note (Signed)
Transition of Care [facility]) - Progression Note    Patient Details  Name: Andrea Larson MRN: [id number] Date of Birth: 11-May-1946  [facility]) CM/SW Contact  Adrian Prows, RN Phone Number: 06/01/2023, 4:46 PM  Clinical Narrative:    PT eval recc SNF; spoke w/ pt's son Chyrel Masson ([phone]; he agrees to recc and says he would like for pt to return to [place] to con't rehab; ins auth.   Expected Discharge Plan:  (TBD- SNF VS ALF) Barriers to Discharge: Continued Medical Work up  Expected Discharge Plan and Services In-house Referral: Clinical Social Work Discharge Planning Services: CM Consult Post Acute Care Choice: Skilled Nursing Facility, Resumption of Svcs/PTA Provider Living arrangements for the past 2 months: Assisted Living Facility ([facility])                 DME Arranged: N/A DME Agency: NA                   Social Determinants of Health (SDOH) Interventions SDOH Screenings   Food Insecurity: No Food Insecurity (06/01/2023)  Housing: Low Risk  (06/01/2023)  Transportation Needs: No Transportation Needs (06/01/2023)  Utilities: Not At Risk (06/01/2023)  Depression (PHQ2-9): Low Risk  (02/11/2023)  Tobacco Use: Medium Risk (05/29/2023)    Readmission Risk Interventions    06/01/2023    4:43 PM 05/31/2023   12:42 PM 05/10/2023    2:54 PM  Readmission Risk Prevention Plan  Transportation Screening Complete Complete Complete  PCP or Specialist Appt within 5-7 Days Complete Complete Complete  Home Care Screening Complete Complete Complete  Medication Review (RN CM) Complete Complete Complete

## 2023-06-01 NOTE — Plan of Care (Signed)

## 2023-06-01 NOTE — Progress Notes (Signed)
PROGRESS NOTE    Andrea Larson  UJW:[id number] DOB: 1946-03-21 DOA: 05/29/2023 PCP: Caesar Bookman, NP   Brief Narrative:  HPI:  Andrea Larson is a 77 y.o. female with medical history significant of essential hypertension, GAD, dementia, CKD stage IIIb, COPD, vitamin B12 deficiency,  hyperlipidemia, chronic hypoxic respiratory failure 2 L oxygen at bedtime and recent femoral fracture status post hip arthroplasty 05/29/2023 presented to emergency department from nursing home due to altered mentation.  Per nursing home report patient is alert but not completely oriented for last 2 days.  As well as patient is more lethargic.  Paramedics reported patient has unknown temperature at nursing facility.  Patient is a poor historian.  She denies any fever, chill, increasing urinary  urgency, frequency and dysuria.  However she is complaining about left-sided flank pain.  Did any chest pain, shortness of breath and palpitation.   Patient has been recently hospitalized 10/30 to 11/5 for mechanical fall and fracture of the right hip status post right sided hip arthroplasty.  Patient was discharged to nursing facility afterward.   ED Course:  At presentation to ED patient's vitals are hemodynamically stable.  Blood pressure 134/9012, heart rate 118 and O2 sat 97% on 3 L. CMP unremarkable.  Sodium 136, potassium 4.6, bicarb 28, blood glucose 101, creatinine 1.43, albumin 3.1 GFR 37 anion gap 9. CBC unremarkable with stable H&H.  Normal pro time and INR.  Normal APTT.  Blood cultures are pending.  Respiratory panel negative.  Lactic acid within normal range.  UA Strow appearance, increased specific gravity and ketone positive.   CT angiogram chest no acute pulmonary embolism, acute cardiopulmonary disease. CT abdomen normal finding.  Cholelithiasis.  No evidence of acute cholecystitis.  Periumbilical ventral hernia.  No bowel obstruction.  Left-sided pyelonephritis.   Head CT scan unremarkable.    ED physician reported that EMS reported patient had fever at nursing home however there is no actual record of the fever on the chart.  Dr. Suezanne Jacquet reported that patient has left-sided CVA tenderness.  At this point unclear etiology of source of infection.  However patient is afebrile and hemodynamically stable.  Normal lactic acid.  In the ED patient has been treated with sepsis protocol and clued IV bolus 2 L and currently on LR 150 cc/h, cefepime, metronidazole and vancomycin.  Pending urine culture and blood cultures.   Hospital list has been consulted for further evaluation management of altered mentation/acute metabolic encephalopathy/delirium and left-sided pyelonephritis.  Assessment & Plan:   Active Problems:   Acute metabolic encephalopathy   Pyelonephritis of left kidney   Hyperlipidemia   COPD (chronic obstructive pulmonary disease) (HCC)   Essential hypertension   Chronic kidney disease (CKD), stage III (moderate) (HCC)   Chronic hypoxic respiratory failure (HCC)   Dementia without behavioral disturbance (HCC)   GAD (generalized anxiety disorder)   Multifocal atrial tachycardia (HCC)   Mild aortic stenosis  Acute metabolic encephalopathy: Could be secondary to infection or worsening dementia however currently patient is fully alert and oriented.  CT head normal.  Ammonia normal.  Left-sided pyelonephritis At the time of admission, no leukocytosis, she was afebrile and sepsis was ruled out.  Interestingly, CT abdomen is consistent with possible left pyelonephritis but UA is not consistent with UTI.  She has been started on antibiotics for that.  Urine culture growing >=100,000 COLONIES/mL STAPHYLOCOCCUS SIMULANS  20,000 COLONIES/mL ENTEROCOCCUS FAECALIS.  Based on sensitivities, will start on Bactrim DS for 5 days.  Watch  renal function closely.  New onset atrial fibrillation ruled out/MAT ruled in: After admission, patient was found to have possibly new onset atrial  fibrillation with RVR, cardiology consulted, they reviewed her EKG and opined that she does not have atrial fibrillation and instead she has MAT.  Currently in sinus rhythm.  Hypoglycemia: Patient had profound hypoglycemia with blood sugar of 21 early morning 05/31/2023. No history of diabetes, not on any antiglycemic medications.  Hemoglobin A1c 4.8.  C-peptide slightly low.  History of right-sided hip fracture status post arthroplasty 11/24 Pain controlled on current medications, seen by PT OT, SNF recommended.  TOC on board.   CKD 3A Currently creatinine better than his baseline.  Hypokalemia: Will replenish.   Hyperlipidemia -Continue Lipitor 20 mg daily   History of COPD Chronic hypoxic respiratory failure 2/3 L oxygen at baseline At baseline.  Continue home medications.   Dementia -Aricept 5 mg daily   Generalized anxiety disorder -Continue BuSpar 150 mg daily and ProAir 40 mg daily, holding Remeron in the setting of confusion/altered mentation.   Initial hypertension Intermittently soft blood pressure, continue to hold amlodipine.   Chronic constipation -Continue Colace 100 mg twice daily  DVT prophylaxis: enoxaparin (LOVENOX) injection 40 mg Start: 05/30/23 2200 SCDs Start: 05/29/23 2345   Code Status: Limited: Do not attempt resuscitation (DNR) -DNR-LIMITED -Do Not Intubate/DNI   Family Communication:  None present at bedside.   Status is: Inpatient Remains inpatient appropriate because: Medically stable, pending placement to SNF   Estimated body mass index is 22.88 kg/m as calculated from the following:   Height as of this encounter: 5\' 2"  (1.575 m).   Weight as of this encounter: 56.7 kg.    Nutritional Assessment: Body mass index is 22.88 kg/m.Marland Kitchen Seen by dietician.  I agree with the assessment and plan as outlined below: Nutrition Status:        . Skin Assessment: I have examined the patient's skin and I agree with the wound assessment as performed  by the wound care RN as outlined below:    Consultants:  None  Procedures:  None  Antimicrobials:  Anti-infectives (From admission, onward)    Start     Dose/Rate Route Frequency Ordered Stop   05/30/23 0000  cefTRIAXone (ROCEPHIN) injection 2 g        2 g Intramuscular Every 24 hours 05/29/23 2346 06/04/23 2359   05/29/23 1530  ceFEPIme (MAXIPIME) 2 g in sodium chloride 0.9 % 100 mL IVPB        2 g 200 mL/hr over 30 Minutes Intravenous  Once 05/29/23 1519 05/29/23 1656   05/29/23 1530  metroNIDAZOLE (FLAGYL) IVPB 500 mg        500 mg 100 mL/hr over 60 Minutes Intravenous  Once 05/29/23 1519 05/29/23 1739   05/29/23 1530  vancomycin (VANCOCIN) IVPB 1000 mg/200 mL premix        1,000 mg 200 mL/hr over 60 Minutes Intravenous  Once 05/29/23 1519 05/29/23 1907         Subjective: Seen and examined.  Complains of low back pain but otherwise no complaint.  Objective: Vitals:   05/31/23 1957 06/01/23 0513 06/01/23 0544 06/01/23 0830  BP: 111/61 (!) 102/59    Pulse: 86 62    Resp: 15 18    Temp: 98.5 F (36.9 C) 98.3 F (36.8 C)    TempSrc: Oral     SpO2: 99% 98%  98%  Weight:   56.7 kg   Height:  Intake/Output Summary (Last 24 hours) at 06/01/2023 0833 Last data filed at 05/31/2023 1416 Gross per 24 hour  Intake --  Output 400 ml  Net -400 ml   Filed Weights   05/30/23 1926 06/01/23 0544  Weight: 56.6 kg 56.7 kg    Examination:  General exam: Appears calm and comfortable  Respiratory system: Clear to auscultation. Respiratory effort normal. Cardiovascular system: S1 & S2 heard, RRR. No JVD, murmurs, rubs, gallops or clicks. No pedal edema. Gastrointestinal system: Abdomen is nondistended, soft and nontender. No organomegaly or masses felt. Normal bowel sounds heard. Central nervous system: Alert and oriented. No focal neurological deficits. Extremities: Symmetric 5 x 5 power. Skin: No rashes, lesions or ulcers.   Data Reviewed: I have personally  reviewed following labs and imaging studies  CBC: Recent Labs  Lab 05/29/23 1528 05/29/23 1535 05/30/23 0500 05/31/23 0550 06/01/23 0603  WBC 8.8  --  11.7* 8.4 6.2  NEUTROABS 7.0  --   --   --   --   HGB 9.7* 10.5* 9.2* 8.4* 8.1*  HCT 31.3* 31.0* 30.6* 29.2* 27.5*  MCV 96.6  --  97.5 101.0* 99.3  PLT 449*  --  418* 350 307   Basic Metabolic Panel: Recent Labs  Lab 05/29/23 1528 05/29/23 1535 05/30/23 0500 05/31/23 0550 06/01/23 0603  NA 136 138 137 139 138  K 4.6 4.7 3.9 4.1 3.3*  CL 99 100 103 104 103  CO2 28  --  25 21* 29  GLUCOSE 101* 100* 86 30* 97  BUN 20 19 12 14 12   CREATININE 1.43* 1.50* 0.95 0.90 0.80  CALCIUM 8.4*  --  8.2* 8.3* 7.9*   GFR: Estimated Creatinine Clearance: 45.1 mL/min (by C-G formula based on SCr of 0.8 mg/dL). Liver Function Tests: Recent Labs  Lab 05/29/23 1528 05/30/23 0500 05/31/23 0550 06/01/23 0603  AST 21 25 21 18   ALT 14 14 13 12   ALKPHOS 177* 172* 144* 125  BILITOT 0.4 0.4 0.9 0.4  PROT 6.7 6.3* 5.5* 5.3*  ALBUMIN 3.1* 2.8* 2.5* 2.3*   No results for input(s): "LIPASE", "AMYLASE" in the last 168 hours. Recent Labs  Lab 05/30/23 0500  AMMONIA 26   Coagulation Profile: Recent Labs  Lab 05/29/23 1528  INR 1.0   Cardiac Enzymes: No results for input(s): "CKTOTAL", "CKMB", "CKMBINDEX", "TROPONINI" in the last 168 hours. BNP (last 3 results) No results for input(s): "PROBNP" in the last 8760 hours. HbA1C: Recent Labs    05/31/23 0550  HGBA1C 4.8   CBG: Recent Labs  Lab 05/31/23 1608 05/31/23 1958 05/31/23 2324 06/01/23 0356 06/01/23 0728  GLUCAP 129* 129* 141* 101* 96   Lipid Profile: No results for input(s): "CHOL", "HDL", "LDLCALC", "TRIG", "CHOLHDL", "LDLDIRECT" in the last 72 hours. Thyroid Function Tests: No results for input(s): "TSH", "T4TOTAL", "FREET4", "T3FREE", "THYROIDAB" in the last 72 hours. Anemia Panel: No results for input(s): "VITAMINB12", "FOLATE", "FERRITIN", "TIBC", "IRON",  "RETICCTPCT" in the last 72 hours. Sepsis Labs: Recent Labs  Lab 05/29/23 1535  LATICACIDVEN 0.9    Recent Results (from the past 240 hour(s))  Blood Culture (routine x 2)     Status: Abnormal (Preliminary result)   Collection Time: 05/29/23  3:28 PM   Specimen: BLOOD RIGHT ARM  Result Value Ref Range Status   Specimen Description   Final    BLOOD RIGHT ARM Performed at [facility], [address] [place], [place] [address]    Special Requests   Final  BOTTLES DRAWN AEROBIC AND ANAEROBIC Blood Culture adequate volume Performed at [facility], [address], [place], [place] [address]    Culture  Setup Time   Final    GRAM POSITIVE COCCI ANAEROBIC BOTTLE ONLY CRITICAL RESULT CALLED TO, READ BACK BY AND VERIFIED WITH: PHARMD JUSTIN LEGGE ON 05/30/23 @ 1746 BY DRT    Culture (A)  Final    STAPHYLOCOCCUS EPIDERMIDIS THE SIGNIFICANCE OF ISOLATING THIS ORGANISM FROM A SINGLE SET OF BLOOD CULTURES WHEN MULTIPLE SETS ARE DRAWN IS UNCERTAIN. PLEASE NOTIFY THE MICROBIOLOGY DEPARTMENT WITHIN ONE WEEK IF SPECIATION AND SENSITIVITIES ARE REQUIRED. Performed at [facility], [address], [place], [place] [address]    Report Status PENDING  Incomplete  Blood Culture ID Panel (Reflexed)     Status: Abnormal   Collection Time: 05/29/23  3:28 PM  Result Value Ref Range Status   Enterococcus faecalis NOT DETECTED NOT DETECTED Final   Enterococcus Faecium NOT DETECTED NOT DETECTED Final   Listeria monocytogenes NOT DETECTED NOT DETECTED Final   Staphylococcus species DETECTED (A) NOT DETECTED Final    Comment: CRITICAL RESULT CALLED TO, READ BACK BY AND VERIFIED WITH: PHARMD JUSTIN LEGGE ON 05/30/23 @ 1746 BY DRT    Staphylococcus aureus (BCID) NOT DETECTED NOT DETECTED Final   Staphylococcus epidermidis DETECTED (A) NOT DETECTED Final    Comment: Methicillin (oxacillin) resistant coagulase negative staphylococcus. Possible blood culture contaminant  (unless isolated from more than one blood culture draw or clinical case suggests pathogenicity). No antibiotic treatment is indicated for blood  culture contaminants. CRITICAL RESULT CALLED TO, READ BACK BY AND VERIFIED WITH: PHARMD JUSTIN LEGGE ON 05/30/23 @ 1746 BY DRT    Staphylococcus lugdunensis NOT DETECTED NOT DETECTED Final   Streptococcus species NOT DETECTED NOT DETECTED Final   Streptococcus agalactiae NOT DETECTED NOT DETECTED Final   Streptococcus pneumoniae NOT DETECTED NOT DETECTED Final   Streptococcus pyogenes NOT DETECTED NOT DETECTED Final   A.calcoaceticus-baumannii NOT DETECTED NOT DETECTED Final   Bacteroides fragilis NOT DETECTED NOT DETECTED Final   Enterobacterales NOT DETECTED NOT DETECTED Final   Enterobacter cloacae complex NOT DETECTED NOT DETECTED Final   Escherichia coli NOT DETECTED NOT DETECTED Final   Klebsiella aerogenes NOT DETECTED NOT DETECTED Final   Klebsiella oxytoca NOT DETECTED NOT DETECTED Final   Klebsiella pneumoniae NOT DETECTED NOT DETECTED Final   Proteus species NOT DETECTED NOT DETECTED Final   Salmonella species NOT DETECTED NOT DETECTED Final   Serratia marcescens NOT DETECTED NOT DETECTED Final   Haemophilus influenzae NOT DETECTED NOT DETECTED Final   Neisseria meningitidis NOT DETECTED NOT DETECTED Final   Pseudomonas aeruginosa NOT DETECTED NOT DETECTED Final   Stenotrophomonas maltophilia NOT DETECTED NOT DETECTED Final   Candida albicans NOT DETECTED NOT DETECTED Final   Candida auris NOT DETECTED NOT DETECTED Final   Candida glabrata NOT DETECTED NOT DETECTED Final   Candida krusei NOT DETECTED NOT DETECTED Final   Candida parapsilosis NOT DETECTED NOT DETECTED Final   Candida tropicalis NOT DETECTED NOT DETECTED Final   Cryptococcus neoformans/gattii NOT DETECTED NOT DETECTED Final   Methicillin resistance mecA/C DETECTED (A) NOT DETECTED Final    Comment: CRITICAL RESULT CALLED TO, READ BACK BY AND VERIFIED WITH: PHARMD  JUSTIN LEGGE ON 05/30/23 @ 1746 BY DRT Performed at [facility], [address] [place], [place] [address]   Resp panel by RT-PCR (RSV, Flu A&B, Covid) Anterior Nasal Swab     Status: None  Collection Time: 05/29/23  3:29 PM   Specimen: Anterior Nasal Swab  Result Value Ref Range Status   SARS Coronavirus 2 by RT PCR NEGATIVE NEGATIVE Final    Comment: (NOTE) SARS-CoV-2 target nucleic acids are NOT DETECTED.  The SARS-CoV-2 RNA is generally detectable in upper respiratory specimens during the acute phase of infection. The lowest concentration of SARS-CoV-2 viral copies this assay can detect is 138 copies/mL. A negative result does not preclude SARS-Cov-2 infection and should not be used as the sole basis for treatment or other patient management decisions. A negative result may occur with  improper specimen collection/handling, submission of specimen other than nasopharyngeal swab, presence of viral mutation(s) within the areas targeted by this assay, and inadequate number of viral copies(<138 copies/mL). A negative result must be combined with clinical observations, patient history, and epidemiological information. The expected result is Negative.  Fact Sheet for Patients:  BloggerCourse.com  Fact Sheet for Healthcare Providers:  SeriousBroker.it  This test is no t yet approved or cleared by the [place] FDA and  has been authorized for detection and/or diagnosis of SARS-CoV-2 by FDA under an Emergency Use Authorization (EUA). This EUA will remain  in effect (meaning this test can be used) for the duration of the COVID-19 declaration under Section 564(b)(1) of the Act, 21 U.S.C.section 360bbb-3(b)(1), unless the authorization is terminated  or revoked sooner.       Influenza A by PCR NEGATIVE NEGATIVE Final   Influenza B by PCR NEGATIVE NEGATIVE Final    Comment: (NOTE) The Xpert Xpress SARS-CoV-2/FLU/RSV plus  assay is intended as an aid in the diagnosis of influenza from Nasopharyngeal swab specimens and should not be used as a sole basis for treatment. Nasal washings and aspirates are unacceptable for Xpert Xpress SARS-CoV-2/FLU/RSV testing.  Fact Sheet for Patients: BloggerCourse.com  Fact Sheet for Healthcare Providers: SeriousBroker.it  This test is not yet approved or cleared by the [place] FDA and has been authorized for detection and/or diagnosis of SARS-CoV-2 by FDA under an Emergency Use Authorization (EUA). This EUA will remain in effect (meaning this test can be used) for the duration of the COVID-19 declaration under Section 564(b)(1) of the Act, 21 U.S.C. section 360bbb-3(b)(1), unless the authorization is terminated or revoked.     Resp Syncytial Virus by PCR NEGATIVE NEGATIVE Final    Comment: (NOTE) Fact Sheet for Patients: BloggerCourse.com  Fact Sheet for Healthcare Providers: SeriousBroker.it  This test is not yet approved or cleared by the [place] FDA and has been authorized for detection and/or diagnosis of SARS-CoV-2 by FDA under an Emergency Use Authorization (EUA). This EUA will remain in effect (meaning this test can be used) for the duration of the COVID-19 declaration under Section 564(b)(1) of the Act, 21 U.S.C. section 360bbb-3(b)(1), unless the authorization is terminated or revoked.  Performed at [facility], [address] [place], [place] [address]   Blood Culture (routine x 2)     Status: None (Preliminary result)   Collection Time: 05/29/23  3:43 PM   Specimen: BLOOD  Result Value Ref Range Status   Specimen Description   Final    BLOOD BLOOD LEFT HAND Performed at [facility], [address] [place], [place] [address]    Special Requests   Final    BOTTLES DRAWN AEROBIC AND ANAEROBIC  Blood Culture adequate volume Performed at [facility], [address], [place], [place] [address]    Culture   Final  NO GROWTH 2 DAYS Performed at [facility], [address], [place], [place] [address]    Report Status PENDING  Incomplete  Urine Culture     Status: Abnormal (Preliminary result)   Collection Time: 05/29/23  6:43 PM   Specimen: Urine, Catheterized  Result Value Ref Range Status   Specimen Description URINE, CATHETERIZED  Final   Special Requests NONE  Final   Culture (A)  Final    >=100,000 COLONIES/mL STAPHYLOCOCCUS SIMULANS 20,000 COLONIES/mL ENTEROCOCCUS FAECALIS SUSCEPTIBILITIES TO FOLLOW    Report Status PENDING  Incomplete   Organism ID, Bacteria STAPHYLOCOCCUS SIMULANS (A)  Final      Susceptibility   Staphylococcus simulans - MIC*    CIPROFLOXACIN >=8 RESISTANT Resistant     GENTAMICIN <=0.5 SENSITIVE Sensitive     NITROFURANTOIN <=16 SENSITIVE Sensitive     OXACILLIN >=4 RESISTANT Resistant     TETRACYCLINE <=1 SENSITIVE Sensitive     VANCOMYCIN <=0.5 SENSITIVE Sensitive     TRIMETH/SULFA <=10 SENSITIVE Sensitive     CLINDAMYCIN RESISTANT Resistant     RIFAMPIN <=0.5 SENSITIVE Sensitive     Inducible Clindamycin Value in next row Resistant      POSITIVEPerformed at [facility], [address] [place], [place] [address]    * >=100,000 COLONIES/mL STAPHYLOCOCCUS SIMULANS     Radiology Studies: ECHOCARDIOGRAM COMPLETE  Result Date: 05/30/2023    ECHOCARDIOGRAM REPORT   Patient Name:   CHENEY PATMAN Date of Exam: 05/30/2023 Medical Rec #:  [id number]         Height:       62.0 in Accession #:    [id number]        Weight:       124.8 lb Date of Birth:  06-09-1946         BSA:          1.564 m Patient Age:    77 years          BP:           111/62 mmHg Patient Gender: F                 HR:           103 bpm. Exam Location:  Inpatient Procedure: 2D Echo, Cardiac Doppler and Color Doppler Indications:    a flutter   History:        Patient has prior history of Echocardiogram examinations, most                 recent 11/27/2019. CAD, COPD, Aortic Valve Disease; Risk                 Factors:Hypertension.  Sonographer:    Harriette Bouillon Referring Phys: [id number] SUBRINA SUNDIL  Sonographer Comments: Technically challenging study due to limited acoustic windows. IMPRESSIONS  1. Left ventricular ejection fraction, by estimation, is 60 to 65%. The left ventricle has normal function. The left ventricle has no regional wall motion abnormalities. Left ventricular diastolic function could not be evaluated.  2. Right ventricular systolic function is normal. The right ventricular size is normal.  3. The mitral valve is degenerative. No evidence of mitral valve regurgitation. Moderate mitral annular calcification.  4. The aortic valve was not well visualized. Aortic valve regurgitation is not visualized. Moderate aortic valve stenosis. Aortic valve mean gradient measures 14.0 mmHg. Aortic valve Vmax measures 2.39 m/s. DVI 0.3 and low SVI of 19. Findings most consistent with paradoxical low flow  low gradient moderate AS.  5. The inferior vena cava is normal in size with greater than 50% respiratory variability, suggesting right atrial pressure of 3 mmHg. FINDINGS  Left Ventricle: Left ventricular ejection fraction, by estimation, is 60 to 65%. The left ventricle has normal function. The left ventricle has no regional wall motion abnormalities. The left ventricular internal cavity size was normal in size. There is  no left ventricular hypertrophy. Left ventricular diastolic function could not be evaluated. Right Ventricle: The right ventricular size is normal. No increase in right ventricular wall thickness. Right ventricular systolic function is normal. Left Atrium: Left atrial size was normal in size. Right Atrium: Right atrial size was normal in size. Pericardium: There is no evidence of pericardial effusion. Mitral Valve: The mitral valve is  degenerative in appearance. There is moderate thickening of the mitral valve leaflet(s). There is mild calcification of the mitral valve leaflet(s). Moderate mitral annular calcification. No evidence of mitral valve regurgitation. Tricuspid Valve: The tricuspid valve is normal in structure. Tricuspid valve regurgitation is not demonstrated. No evidence of tricuspid stenosis. Aortic Valve: The aortic valve was not well visualized. Aortic valve regurgitation is not visualized. Moderate aortic stenosis is present. Aortic valve mean gradient measures 14.0 mmHg. Aortic valve peak gradient measures 22.9 mmHg. Aortic valve area, by  VTI measures 0.59 cm. Pulmonic Valve: The pulmonic valve was normal in structure. Pulmonic valve regurgitation is not visualized. No evidence of pulmonic stenosis. Aorta: The aortic root is normal in size and structure. Venous: The inferior vena cava is normal in size with greater than 50% respiratory variability, suggesting right atrial pressure of 3 mmHg. IAS/Shunts: No atrial level shunt detected by color flow Doppler.  LEFT VENTRICLE PLAX 2D LVIDd:         3.80 cm LVIDs:         2.50 cm LV PW:         1.00 cm LV IVS:        1.00 cm LVOT diam:     1.60 cm LV SV:         29 LV SV Index:   19 LVOT Area:     2.01 cm  IVC IVC diam: 1.80 cm LEFT ATRIUM         Index LA diam:    3.30 cm 2.11 cm/m  AORTIC VALVE AV Area (Vmax):    0.56 cm AV Area (Vmean):   0.55 cm AV Area (VTI):     0.59 cm AV Vmax:           239.20 cm/s AV Vmean:          171.200 cm/s AV VTI:            0.491 m AV Peak Grad:      22.9 mmHg AV Mean Grad:      14.0 mmHg LVOT Vmax:         67.00 cm/s LVOT Vmean:        47.000 cm/s LVOT VTI:          0.145 m LVOT/AV VTI ratio: 0.30  AORTA Ao Root diam: 2.60 cm  SHUNTS Systemic VTI:  0.14 m Systemic Diam: 1.60 cm Armanda Magic MD Electronically signed by Armanda Magic MD Signature Date/Time: 05/30/2023/3:02:36 PM    Final     Scheduled Meds:  aspirin  81 mg Oral BID WC    buPROPion  150 mg Oral Daily   cefTRIAXone (ROCEPHIN) IM  2 g Intramuscular Q24H   docusate  sodium  100 mg Oral BID   donepezil  5 mg Oral QHS   enoxaparin (LOVENOX) injection  40 mg Subcutaneous QHS   FLUoxetine  40 mg Oral Daily   fluticasone  2 spray Each Nare Daily   fluticasone furoate-vilanterol  1 puff Inhalation Daily   And   umeclidinium bromide  1 puff Inhalation Daily   ipratropium  0.5 mg Nebulization TID   memantine  10 mg Oral BID   montelukast  10 mg Oral Daily   pantoprazole  40 mg Oral Daily   rosuvastatin  20 mg Oral Daily   thiamine  100 mg Oral Daily   Continuous Infusions:   LOS: 3 days   Hughie Closs, MD [facility]  06/01/2023, 8:33 AM   *Please note that this is a verbal dictation therefore any spelling or grammatical errors are due to the "Dragon Medical One" system interpretation.  Please page via Amion and do not message via secure chat for urgent patient care matters. Secure chat can be used for non urgent patient care matters.  How to contact the [facility] [place] Attending or Consulting provider 7A - 7P or covering provider during after hours 7P -7A, for this patient?  Check the care team in [facility] and look for a) attending/consulting TRH provider listed and b) the [facility] team listed. Page or secure chat 7A-7P. Log into [email or web address] and use [place]'s universal password to access. If you do not have the password, please contact the hospital operator. Locate the [facility] provider you are looking for under [facility] and page to a number that you can be directly reached. If you still have difficulty reaching the provider, please page the [facility] (Director on Call) for the Hospitalists listed on amion for assistance.

## 2023-06-02 DIAGNOSIS — G9341 Metabolic encephalopathy: Secondary | ICD-10-CM | POA: Diagnosis not present

## 2023-06-02 DIAGNOSIS — N12 Tubulo-interstitial nephritis, not specified as acute or chronic: Secondary | ICD-10-CM | POA: Diagnosis not present

## 2023-06-02 LAB — COMPREHENSIVE METABOLIC PANEL
ALT: 13 U/L (ref 0–44)
AST: 18 U/L (ref 15–41)
Albumin: 2.4 g/dL — ABNORMAL LOW (ref 3.5–5.0)
Alkaline Phosphatase: 137 U/L — ABNORMAL HIGH (ref 38–126)
Anion gap: 4 — ABNORMAL LOW (ref 5–15)
BUN: 8 mg/dL (ref 8–23)
CO2: 29 mmol/L (ref 22–32)
Calcium: 8 mg/dL — ABNORMAL LOW (ref 8.9–10.3)
Chloride: 100 mmol/L (ref 98–111)
Creatinine, Ser: 0.78 mg/dL (ref 0.44–1.00)
GFR, Estimated: >60 mL/min (ref >60)
Glucose, Bld: 83 mg/dL (ref 70–99)
Potassium: 4.2 mmol/L (ref 3.5–5.1)
Sodium: 133 mmol/L — ABNORMAL LOW (ref 135–145)
Total Bilirubin: 0.3 mg/dL (ref <1.2)
Total Protein: 5.5 g/dL — ABNORMAL LOW (ref 6.5–8.1)

## 2023-06-02 LAB — URINE CULTURE: Culture: >=100000 — AB

## 2023-06-02 LAB — CBC
HCT: 29.3 % — ABNORMAL LOW (ref 36.0–46.0)
Hemoglobin: 8.6 g/dL — ABNORMAL LOW (ref 12.0–15.0)
MCH: 29.5 pg (ref 26.0–34.0)
MCHC: 29.4 g/dL — ABNORMAL LOW (ref 30.0–36.0)
MCV: 100.3 fL — ABNORMAL HIGH (ref 80.0–100.0)
Platelets: 272 10*3/uL (ref 150–400)
RBC: 2.92 MIL/uL — ABNORMAL LOW (ref 3.87–5.11)
RDW: 14.7 % (ref 11.5–15.5)
WBC: 6.1 10*3/uL (ref 4.0–10.5)
nRBC: 0 % (ref 0.0–0.2)

## 2023-06-02 LAB — GLUCOSE, CAPILLARY
Glucose-Capillary: 103 mg/dL — ABNORMAL HIGH (ref 70–99)
Glucose-Capillary: 130 mg/dL — ABNORMAL HIGH (ref 70–99)
Glucose-Capillary: 74 mg/dL (ref 70–99)
Glucose-Capillary: 84 mg/dL (ref 70–99)
Glucose-Capillary: 88 mg/dL (ref 70–99)
Glucose-Capillary: 92 mg/dL (ref 70–99)

## 2023-06-02 MED ORDER — HYDROCODONE-ACETAMINOPHEN 5-325 MG PO TABS: 1.0000 | ORAL_TABLET | ORAL | 0 refills | Status: DC | PRN

## 2023-06-02 NOTE — Plan of Care (Signed)
Problem: Education: Goal: Knowledge of General Education information will improve Description: Including pain rating scale, medication(s)/side effects and non-pharmacologic comfort measures Outcome: Progressing   Problem: Clinical Measurements: Goal: Ability to maintain clinical measurements within normal limits will improve Outcome: Progressing   Problem: Safety: Goal: Ability to remain free from injury will improve Outcome: Progressing

## 2023-06-02 NOTE — Progress Notes (Signed)
PROGRESS NOTE    Andrea Larson  EXB:[id number] DOB: 1946/05/30 DOA: 05/29/2023 PCP: Caesar Bookman, NP   Brief Narrative:  HPI:  Andrea Larson is a 77 y.o. female with medical history significant of essential hypertension, GAD, dementia, CKD stage IIIb, COPD, vitamin B12 deficiency,  hyperlipidemia, chronic hypoxic respiratory failure 2 L oxygen at bedtime and recent femoral fracture status post hip arthroplasty 05/29/2023 presented to emergency department from nursing home due to altered mentation.  Per nursing home report patient is alert but not completely oriented for last 2 days.  As well as patient is more lethargic.  Paramedics reported patient has unknown temperature at nursing facility.  Patient is a poor historian.  She denies any fever, chill, increasing urinary  urgency, frequency and dysuria.  However she is complaining about left-sided flank pain.  Did any chest pain, shortness of breath and palpitation.   Patient has been recently hospitalized 10/30 to 11/5 for mechanical fall and fracture of the right hip status post right sided hip arthroplasty.  Patient was discharged to nursing facility afterward.   ED Course:  At presentation to ED patient's vitals are hemodynamically stable.  Blood pressure 134/9012, heart rate 118 and O2 sat 97% on 3 L. CMP unremarkable.  Sodium 136, potassium 4.6, bicarb 28, blood glucose 101, creatinine 1.43, albumin 3.1 GFR 37 anion gap 9. CBC unremarkable with stable H&H.  Normal pro time and INR.  Normal APTT.  Blood cultures are pending.  Respiratory panel negative.  Lactic acid within normal range.  UA Strow appearance, increased specific gravity and ketone positive.   CT angiogram chest no acute pulmonary embolism, acute cardiopulmonary disease. CT abdomen normal finding.  Cholelithiasis.  No evidence of acute cholecystitis.  Periumbilical ventral hernia.  No bowel obstruction.  Left-sided pyelonephritis.   Head CT scan unremarkable.    ED physician reported that EMS reported patient had fever at nursing home however there is no actual record of the fever on the chart.  Dr. Suezanne Jacquet reported that patient has left-sided CVA tenderness.  At this point unclear etiology of source of infection.  However patient is afebrile and hemodynamically stable.  Normal lactic acid.  In the ED patient has been treated with sepsis protocol and clued IV bolus 2 L and currently on LR 150 cc/h, cefepime, metronidazole and vancomycin.  Pending urine culture and blood cultures.   Hospital list has been consulted for further evaluation management of altered mentation/acute metabolic encephalopathy/delirium and left-sided pyelonephritis.  Assessment & Plan:   Active Problems:   Acute metabolic encephalopathy   Pyelonephritis of left kidney   Hyperlipidemia   COPD (chronic obstructive pulmonary disease) (HCC)   Essential hypertension   Chronic kidney disease (CKD), stage III (moderate) (HCC)   Chronic hypoxic respiratory failure (HCC)   Dementia without behavioral disturbance (HCC)   GAD (generalized anxiety disorder)   Multifocal atrial tachycardia (HCC)   Mild aortic stenosis  Acute metabolic encephalopathy: Could be secondary to infection or worsening dementia however currently patient is fully alert and oriented.  CT head normal.  Ammonia normal.  Left-sided pyelonephritis At the time of admission, no leukocytosis, she was afebrile and sepsis was ruled out.  Interestingly, CT abdomen is consistent with possible left pyelonephritis but UA is not consistent with UTI.  She has been started on antibiotics for that.  Urine culture growing >=100,000 COLONIES/mL STAPHYLOCOCCUS SIMULANS  20,000 COLONIES/mL ENTEROCOCCUS FAECALIS.  Based on sensitivities, transition her from IV to oral Bactrim DS and will  continue for total of 5 days.  New onset atrial fibrillation ruled out/MAT ruled in: After admission, patient was found to have possibly new onset  atrial fibrillation with RVR, cardiology consulted, they reviewed her EKG and opined that she does not have atrial fibrillation and instead she has MAT.  Currently in sinus rhythm.  Hypoglycemia: Patient had profound hypoglycemia with blood sugar of 21 early morning 05/31/2023. No history of diabetes, not on any antiglycemic medications.  Hemoglobin A1c 4.8.  C-peptide slightly low.  Blood sugar has remained stable/controlled since then.  History of right-sided hip fracture status post arthroplasty 11/24 Pain controlled on current medications, seen by PT OT, SNF recommended.  TOC on board.   CKD 3A Currently creatinine better than his baseline.  Hypokalemia: Will replenish.   Hyperlipidemia -Continue Lipitor 20 mg daily   History of COPD Chronic hypoxic respiratory failure 2/3 L oxygen at baseline At baseline.  Continue home medications.   Dementia -Aricept 5 mg daily   Generalized anxiety disorder -Continue BuSpar 150 mg daily and ProAir 40 mg daily, holding Remeron in the setting of confusion/altered mentation.   Initial hypertension Intermittently soft blood pressure, continue to hold amlodipine.   Chronic constipation -Continue Colace 100 mg twice daily  DVT prophylaxis: enoxaparin (LOVENOX) injection 40 mg Start: 05/30/23 2200 SCDs Start: 05/29/23 2345   Code Status: Limited: Do not attempt resuscitation (DNR) -DNR-LIMITED -Do Not Intubate/DNI   Family Communication:  None present at bedside.   Status is: Inpatient Remains inpatient appropriate because: Medically stable, pending placement to SNF/awaiting insurance authorization   Estimated body mass index is 22.54 kg/m as calculated from the following:   Height as of this encounter: 5\' 2"  (1.575 m).   Weight as of this encounter: 55.9 kg.    Nutritional Assessment: Body mass index is 22.54 kg/m.Marland Kitchen Seen by dietician.  I agree with the assessment and plan as outlined below: Nutrition Status:        . Skin  Assessment: I have examined the patient's skin and I agree with the wound assessment as performed by the wound care RN as outlined below:    Consultants:  None  Procedures:  None  Antimicrobials:  Anti-infectives (From admission, onward)    Start     Dose/Rate Route Frequency Ordered Stop   06/01/23 1445  sulfamethoxazole-trimethoprim (BACTRIM DS) 800-160 MG per tablet 1 tablet        1 tablet Oral Every 12 hours 06/01/23 1357 06/06/23 0959   05/30/23 0000  cefTRIAXone (ROCEPHIN) injection 2 g  Status:  Discontinued        2 g Intramuscular Every 24 hours 05/29/23 2346 06/01/23 1357   05/29/23 1530  ceFEPIme (MAXIPIME) 2 g in sodium chloride 0.9 % 100 mL IVPB        2 g 200 mL/hr over 30 Minutes Intravenous  Once 05/29/23 1519 05/29/23 1656   05/29/23 1530  metroNIDAZOLE (FLAGYL) IVPB 500 mg        500 mg 100 mL/hr over 60 Minutes Intravenous  Once 05/29/23 1519 05/29/23 1739   05/29/23 1530  vancomycin (VANCOCIN) IVPB 1000 mg/200 mL premix        1,000 mg 200 mL/hr over 60 Minutes Intravenous  Once 05/29/23 1519 05/29/23 1907         Subjective: Patient seen and examined.  She has no complaints.  Objective: Vitals:   06/01/23 1940 06/02/23 0500 06/02/23 0505 06/02/23 0900  BP:   121/65   Pulse:   100  Resp:   17   Temp:   97.8 F (36.6 C)   TempSrc:   Oral   SpO2: 99%  100% 97%  Weight:  55.9 kg    Height:        Intake/Output Summary (Last 24 hours) at 06/02/2023 1247 Last data filed at 06/02/2023 1029 Gross per 24 hour  Intake 120 ml  Output 500 ml  Net -380 ml   Filed Weights   05/30/23 1926 06/01/23 0544 06/02/23 0500  Weight: 56.6 kg 56.7 kg 55.9 kg    Examination:  General exam: Appears calm and comfortable  Respiratory system: Clear to auscultation. Respiratory effort normal. Cardiovascular system: S1 & S2 heard, RRR. No JVD, murmurs, rubs, gallops or clicks. No pedal edema. Gastrointestinal system: Abdomen is nondistended, soft and  nontender. No organomegaly or masses felt. Normal bowel sounds heard. Central nervous system: Alert and oriented. No focal neurological deficits. Extremities: Symmetric 5 x 5 power. Skin: No rashes, lesions or ulcers.   Data Reviewed: I have personally reviewed following labs and imaging studies  CBC: Recent Labs  Lab 05/29/23 1528 05/29/23 1535 05/30/23 0500 05/31/23 0550 06/01/23 0603 06/02/23 0541  WBC 8.8  --  11.7* 8.4 6.2 6.1  NEUTROABS 7.0  --   --   --   --   --   HGB 9.7* 10.5* 9.2* 8.4* 8.1* 8.6*  HCT 31.3* 31.0* 30.6* 29.2* 27.5* 29.3*  MCV 96.6  --  97.5 101.0* 99.3 100.3*  PLT 449*  --  418* 350 307 272   Basic Metabolic Panel: Recent Labs  Lab 05/29/23 1528 05/29/23 1535 05/30/23 0500 05/31/23 0550 06/01/23 0603 06/02/23 0541  NA 136 138 137 139 138 133*  K 4.6 4.7 3.9 4.1 3.3* 4.2  CL 99 100 103 104 103 100  CO2 28  --  25 21* 29 29  GLUCOSE 101* 100* 86 30* 97 83  BUN 20 19 12 14 12 8   CREATININE 1.43* 1.50* 0.95 0.90 0.80 0.78  CALCIUM 8.4*  --  8.2* 8.3* 7.9* 8.0*   GFR: Estimated Creatinine Clearance: 45.1 mL/min (by C-G formula based on SCr of 0.78 mg/dL). Liver Function Tests: Recent Labs  Lab 05/29/23 1528 05/30/23 0500 05/31/23 0550 06/01/23 0603 06/02/23 0541  AST 21 25 21 18 18   ALT 14 14 13 12 13   ALKPHOS 177* 172* 144* 125 137*  BILITOT 0.4 0.4 0.9 0.4 0.3  PROT 6.7 6.3* 5.5* 5.3* 5.5*  ALBUMIN 3.1* 2.8* 2.5* 2.3* 2.4*   No results for input(s): "LIPASE", "AMYLASE" in the last 168 hours. Recent Labs  Lab 05/30/23 0500  AMMONIA 26   Coagulation Profile: Recent Labs  Lab 05/29/23 1528  INR 1.0   Cardiac Enzymes: No results for input(s): "CKTOTAL", "CKMB", "CKMBINDEX", "TROPONINI" in the last 168 hours. BNP (last 3 results) No results for input(s): "PROBNP" in the last 8760 hours. HbA1C: Recent Labs    05/31/23 0550  HGBA1C 4.8   CBG: Recent Labs  Lab 06/01/23 2012 06/01/23 2345 06/02/23 0425 06/02/23 0726  06/02/23 1110  GLUCAP 104* 89 84 74 92   Lipid Profile: No results for input(s): "CHOL", "HDL", "LDLCALC", "TRIG", "CHOLHDL", "LDLDIRECT" in the last 72 hours. Thyroid Function Tests: No results for input(s): "TSH", "T4TOTAL", "FREET4", "T3FREE", "THYROIDAB" in the last 72 hours. Anemia Panel: No results for input(s): "VITAMINB12", "FOLATE", "FERRITIN", "TIBC", "IRON", "RETICCTPCT" in the last 72 hours. Sepsis Labs: Recent Labs  Lab 05/29/23 1535  LATICACIDVEN 0.9    Recent Results (  from the past 240 hour(s))  Blood Culture (routine x 2)     Status: Abnormal   Collection Time: 05/29/23  3:28 PM   Specimen: BLOOD RIGHT ARM  Result Value Ref Range Status   Specimen Description   Final    BLOOD RIGHT ARM Performed at [facility], [address] [place], [place] [address]    Special Requests   Final    BOTTLES DRAWN AEROBIC AND ANAEROBIC Blood Culture adequate volume Performed at [facility], [address] [place], [place] [address]    Culture  Setup Time   Final    GRAM POSITIVE COCCI ANAEROBIC BOTTLE ONLY CRITICAL RESULT CALLED TO, READ BACK BY AND VERIFIED WITH: PHARMD JUSTIN LEGGE ON 05/30/23 @ 1746 BY DRT    Culture (A)  Final    STAPHYLOCOCCUS EPIDERMIDIS THE SIGNIFICANCE OF ISOLATING THIS ORGANISM FROM A SINGLE SET OF BLOOD CULTURES WHEN MULTIPLE SETS ARE DRAWN IS UNCERTAIN. PLEASE NOTIFY THE MICROBIOLOGY DEPARTMENT WITHIN ONE WEEK IF SPECIATION AND SENSITIVITIES ARE REQUIRED. Performed at [facility], [address], [place], [place] [address]    Report Status 06/01/2023 FINAL  Final  Blood Culture ID Panel (Reflexed)     Status: Abnormal   Collection Time: 05/29/23  3:28 PM  Result Value Ref Range Status   Enterococcus faecalis NOT DETECTED NOT DETECTED Final   Enterococcus Faecium NOT DETECTED NOT DETECTED Final   Listeria monocytogenes NOT DETECTED NOT DETECTED Final   Staphylococcus species DETECTED (A) NOT DETECTED Final     Comment: CRITICAL RESULT CALLED TO, READ BACK BY AND VERIFIED WITH: PHARMD JUSTIN LEGGE ON 05/30/23 @ 1746 BY DRT    Staphylococcus aureus (BCID) NOT DETECTED NOT DETECTED Final   Staphylococcus epidermidis DETECTED (A) NOT DETECTED Final    Comment: Methicillin (oxacillin) resistant coagulase negative staphylococcus. Possible blood culture contaminant (unless isolated from more than one blood culture draw or clinical case suggests pathogenicity). No antibiotic treatment is indicated for blood  culture contaminants. CRITICAL RESULT CALLED TO, READ BACK BY AND VERIFIED WITH: PHARMD JUSTIN LEGGE ON 05/30/23 @ 1746 BY DRT    Staphylococcus lugdunensis NOT DETECTED NOT DETECTED Final   Streptococcus species NOT DETECTED NOT DETECTED Final   Streptococcus agalactiae NOT DETECTED NOT DETECTED Final   Streptococcus pneumoniae NOT DETECTED NOT DETECTED Final   Streptococcus pyogenes NOT DETECTED NOT DETECTED Final   A.calcoaceticus-baumannii NOT DETECTED NOT DETECTED Final   Bacteroides fragilis NOT DETECTED NOT DETECTED Final   Enterobacterales NOT DETECTED NOT DETECTED Final   Enterobacter cloacae complex NOT DETECTED NOT DETECTED Final   Escherichia coli NOT DETECTED NOT DETECTED Final   Klebsiella aerogenes NOT DETECTED NOT DETECTED Final   Klebsiella oxytoca NOT DETECTED NOT DETECTED Final   Klebsiella pneumoniae NOT DETECTED NOT DETECTED Final   Proteus species NOT DETECTED NOT DETECTED Final   Salmonella species NOT DETECTED NOT DETECTED Final   Serratia marcescens NOT DETECTED NOT DETECTED Final   Haemophilus influenzae NOT DETECTED NOT DETECTED Final   Neisseria meningitidis NOT DETECTED NOT DETECTED Final   Pseudomonas aeruginosa NOT DETECTED NOT DETECTED Final   Stenotrophomonas maltophilia NOT DETECTED NOT DETECTED Final   Candida albicans NOT DETECTED NOT DETECTED Final   Candida auris NOT DETECTED NOT DETECTED Final   Candida glabrata NOT DETECTED NOT DETECTED Final   Candida  krusei NOT DETECTED NOT DETECTED Final   Candida parapsilosis NOT DETECTED NOT DETECTED Final   Candida tropicalis NOT DETECTED NOT DETECTED Final  Cryptococcus neoformans/gattii NOT DETECTED NOT DETECTED Final   Methicillin resistance mecA/C DETECTED (A) NOT DETECTED Final    Comment: CRITICAL RESULT CALLED TO, READ BACK BY AND VERIFIED WITH: PHARMD JUSTIN LEGGE ON 05/30/23 @ 1746 BY DRT Performed at [facility], [address] [place], [place] [address]   Resp panel by RT-PCR (RSV, Flu A&B, Covid) Anterior Nasal Swab     Status: None   Collection Time: 05/29/23  3:29 PM   Specimen: Anterior Nasal Swab  Result Value Ref Range Status   SARS Coronavirus 2 by RT PCR NEGATIVE NEGATIVE Final    Comment: (NOTE) SARS-CoV-2 target nucleic acids are NOT DETECTED.  The SARS-CoV-2 RNA is generally detectable in upper respiratory specimens during the acute phase of infection. The lowest concentration of SARS-CoV-2 viral copies this assay can detect is 138 copies/mL. A negative result does not preclude SARS-Cov-2 infection and should not be used as the sole basis for treatment or other patient management decisions. A negative result may occur with  improper specimen collection/handling, submission of specimen other than nasopharyngeal swab, presence of viral mutation(s) within the areas targeted by this assay, and inadequate number of viral copies(<138 copies/mL). A negative result must be combined with clinical observations, patient history, and epidemiological information. The expected result is Negative.  Fact Sheet for Patients:  BloggerCourse.com  Fact Sheet for Healthcare Providers:  SeriousBroker.it  This test is no t yet approved or cleared by the [place] FDA and  has been authorized for detection and/or diagnosis of SARS-CoV-2 by FDA under an Emergency Use Authorization (EUA). This EUA will remain  in effect (meaning  this test can be used) for the duration of the COVID-19 declaration under Section 564(b)(1) of the Act, 21 U.S.C.section 360bbb-3(b)(1), unless the authorization is terminated  or revoked sooner.       Influenza A by PCR NEGATIVE NEGATIVE Final   Influenza B by PCR NEGATIVE NEGATIVE Final    Comment: (NOTE) The Xpert Xpress SARS-CoV-2/FLU/RSV plus assay is intended as an aid in the diagnosis of influenza from Nasopharyngeal swab specimens and should not be used as a sole basis for treatment. Nasal washings and aspirates are unacceptable for Xpert Xpress SARS-CoV-2/FLU/RSV testing.  Fact Sheet for Patients: BloggerCourse.com  Fact Sheet for Healthcare Providers: SeriousBroker.it  This test is not yet approved or cleared by the [place] FDA and has been authorized for detection and/or diagnosis of SARS-CoV-2 by FDA under an Emergency Use Authorization (EUA). This EUA will remain in effect (meaning this test can be used) for the duration of the COVID-19 declaration under Section 564(b)(1) of the Act, 21 U.S.C. section 360bbb-3(b)(1), unless the authorization is terminated or revoked.     Resp Syncytial Virus by PCR NEGATIVE NEGATIVE Final    Comment: (NOTE) Fact Sheet for Patients: BloggerCourse.com  Fact Sheet for Healthcare Providers: SeriousBroker.it  This test is not yet approved or cleared by the [place] FDA and has been authorized for detection and/or diagnosis of SARS-CoV-2 by FDA under an Emergency Use Authorization (EUA). This EUA will remain in effect (meaning this test can be used) for the duration of the COVID-19 declaration under Section 564(b)(1) of the Act, 21 U.S.C. section 360bbb-3(b)(1), unless the authorization is terminated or revoked.  Performed at [facility], [address], [place], [place] [address]   Blood Culture  (routine x 2)     Status: None (Preliminary result)   Collection Time: 05/29/23  3:43 PM   Specimen:  BLOOD  Result Value Ref Range Status   Specimen Description   Final    BLOOD BLOOD LEFT HAND Performed at [facility], [address], [place], [place] [address]    Special Requests   Final    BOTTLES DRAWN AEROBIC AND ANAEROBIC Blood Culture adequate volume Performed at [facility], [address], [place], [place] [address]    Culture   Final    NO GROWTH 4 DAYS Performed at [facility], [address] [place], [place] [address]    Report Status PENDING  Incomplete  Urine Culture     Status: Abnormal   Collection Time: 05/29/23  6:43 PM   Specimen: Urine, Catheterized  Result Value Ref Range Status   Specimen Description   Final    URINE, CATHETERIZED Performed at [facility], [address], [place], [place] [address]    Special Requests   Final    NONE Performed at [facility], [address], [place], [place] [address]    Culture (A)  Final    >=100,000 COLONIES/mL STAPHYLOCOCCUS SIMULANS 20,000 COLONIES/mL ENTEROCOCCUS FAECALIS    Report Status 06/02/2023 FINAL  Final   Organism ID, Bacteria STAPHYLOCOCCUS SIMULANS (A)  Final   Organism ID, Bacteria ENTEROCOCCUS FAECALIS (A)  Final      Susceptibility   Enterococcus faecalis - MIC*    AMPICILLIN <=2 SENSITIVE Sensitive     NITROFURANTOIN <=16 SENSITIVE Sensitive     VANCOMYCIN 1 SENSITIVE Sensitive     * 20,000 COLONIES/mL ENTEROCOCCUS FAECALIS   Staphylococcus simulans - MIC*    CIPROFLOXACIN >=8 RESISTANT Resistant     GENTAMICIN <=0.5 SENSITIVE Sensitive     NITROFURANTOIN <=16 SENSITIVE Sensitive     OXACILLIN >=4 RESISTANT Resistant     TETRACYCLINE <=1 SENSITIVE Sensitive     VANCOMYCIN <=0.5 SENSITIVE Sensitive     TRIMETH/SULFA <=10 SENSITIVE Sensitive     CLINDAMYCIN RESISTANT Resistant     RIFAMPIN <=0.5 SENSITIVE  Sensitive     Inducible Clindamycin POSITIVE Resistant     * >=100,000 COLONIES/mL STAPHYLOCOCCUS SIMULANS     Radiology Studies: No results found.  Scheduled Meds:  aspirin  81 mg Oral BID WC   buPROPion  150 mg Oral Daily   docusate sodium  100 mg Oral BID   donepezil  5 mg Oral QHS   enoxaparin (LOVENOX) injection  40 mg Subcutaneous QHS   FLUoxetine  40 mg Oral Daily   fluticasone  2 spray Each Nare Daily   fluticasone furoate-vilanterol  1 puff Inhalation Daily   And   umeclidinium bromide  1 puff Inhalation Daily   ipratropium  0.5 mg Nebulization TID   memantine  10 mg Oral BID   montelukast  10 mg Oral Daily   pantoprazole  40 mg Oral Daily   rosuvastatin  20 mg Oral Daily   sulfamethoxazole-trimethoprim  1 tablet Oral Q12H   thiamine  100 mg Oral Daily   Continuous Infusions:   LOS: 4 days   Hughie Closs, MD [facility]  06/02/2023, 12:47 PM   *Please note that this is a verbal dictation therefore any spelling or grammatical errors are due to the "Dragon Medical One" system interpretation.  Please page via Amion and do not message via secure chat for urgent patient care matters. Secure chat can be used for non urgent patient care matters.  How to contact the [facility] Attending or Consulting provider 7A - 7P  or covering provider during after hours 7P -7A, for this patient?  Check the care team in [facility] and look for a) attending/consulting TRH provider listed and b) the [facility] team listed. Page or secure chat 7A-7P. Log into [email or web address] and use [place]'s universal password to access. If you do not have the password, please contact the hospital operator. Locate the [facility] provider you are looking for under [facility] and page to a number that you can be directly reached. If you still have difficulty reaching the provider, please page the [facility] (Director on Call) for the Hospitalists listed on amion for assistance.

## 2023-06-02 NOTE — TOC Progression Note (Signed)
Transition of Care [facility]) - Progression Note    Patient Details  Name: Andrea Larson MRN: [id number] Date of Birth: 07/27/1945  Transition of Care [facility]) CM/SW Contact  Adrian Prows, RN Phone Number: 06/02/2023, 12:40 PM  Clinical Narrative:    Sherron Monday w/ [place] [facility] [place]; she confirmed pt is current resident, and bed hold placed; she says new auth needed; ins auth initiated; awaiting decision.   Expected Discharge Plan:  (TBD- SNF VS ALF) Barriers to Discharge: Continued Medical Work up  Expected Discharge Plan and Services In-house Referral: Clinical Social Work Discharge Planning Services: CM Consult Post Acute Care Choice: Skilled Nursing Facility, Resumption of Svcs/PTA Provider Living arrangements for the past 2 months: Assisted Living Facility ([facility])                 DME Arranged: N/A DME Agency: NA                   Social Determinants of Health (SDOH) Interventions SDOH Screenings   Food Insecurity: No Food Insecurity (06/01/2023)  Housing: Low Risk  (06/01/2023)  Transportation Needs: No Transportation Needs (06/01/2023)  Utilities: Not At Risk (06/01/2023)  Depression (PHQ2-9): Low Risk  (02/11/2023)  Tobacco Use: Medium Risk (05/29/2023)    Readmission Risk Interventions    06/01/2023    4:43 PM 05/31/2023   12:42 PM 05/10/2023    2:54 PM  Readmission Risk Prevention Plan  Transportation Screening Complete Complete Complete  PCP or Specialist Appt within 5-7 Days Complete Complete Complete  Home Care Screening Complete Complete Complete  Medication Review (RN CM) Complete Complete Complete

## 2023-06-03 DIAGNOSIS — I499 Cardiac arrhythmia, unspecified: Secondary | ICD-10-CM | POA: Diagnosis not present

## 2023-06-03 DIAGNOSIS — M545 Low back pain, unspecified: Secondary | ICD-10-CM | POA: Diagnosis not present

## 2023-06-03 DIAGNOSIS — M25551 Pain in right hip: Secondary | ICD-10-CM | POA: Diagnosis not present

## 2023-06-03 DIAGNOSIS — I6523 Occlusion and stenosis of bilateral carotid arteries: Secondary | ICD-10-CM | POA: Diagnosis not present

## 2023-06-03 DIAGNOSIS — E785 Hyperlipidemia, unspecified: Secondary | ICD-10-CM | POA: Diagnosis not present

## 2023-06-03 DIAGNOSIS — R000 Tachycardia, unspecified: Secondary | ICD-10-CM | POA: Diagnosis not present

## 2023-06-03 DIAGNOSIS — R4182 Altered mental status, unspecified: Secondary | ICD-10-CM | POA: Diagnosis not present

## 2023-06-03 DIAGNOSIS — Z7951 Long term (current) use of inhaled steroids: Secondary | ICD-10-CM | POA: Diagnosis not present

## 2023-06-03 DIAGNOSIS — R350 Frequency of micturition: Secondary | ICD-10-CM | POA: Diagnosis not present

## 2023-06-03 DIAGNOSIS — S72041D Displaced fracture of base of neck of right femur, subsequent encounter for closed fracture with routine healing: Secondary | ICD-10-CM | POA: Diagnosis not present

## 2023-06-03 DIAGNOSIS — R9082 White matter disease, unspecified: Secondary | ICD-10-CM | POA: Diagnosis not present

## 2023-06-03 DIAGNOSIS — G9341 Metabolic encephalopathy: Secondary | ICD-10-CM | POA: Diagnosis not present

## 2023-06-03 DIAGNOSIS — R0682 Tachypnea, not elsewhere classified: Secondary | ICD-10-CM | POA: Diagnosis not present

## 2023-06-03 DIAGNOSIS — R278 Other lack of coordination: Secondary | ICD-10-CM | POA: Diagnosis not present

## 2023-06-03 DIAGNOSIS — M6281 Muscle weakness (generalized): Secondary | ICD-10-CM | POA: Diagnosis not present

## 2023-06-03 DIAGNOSIS — Z96642 Presence of left artificial hip joint: Secondary | ICD-10-CM | POA: Diagnosis not present

## 2023-06-03 DIAGNOSIS — M419 Scoliosis, unspecified: Secondary | ICD-10-CM | POA: Diagnosis not present

## 2023-06-03 DIAGNOSIS — N10 Acute tubulo-interstitial nephritis: Secondary | ICD-10-CM | POA: Diagnosis not present

## 2023-06-03 DIAGNOSIS — R0689 Other abnormalities of breathing: Secondary | ICD-10-CM | POA: Diagnosis not present

## 2023-06-03 DIAGNOSIS — I700 Atherosclerosis of aorta: Secondary | ICD-10-CM | POA: Diagnosis not present

## 2023-06-03 DIAGNOSIS — E559 Vitamin D deficiency, unspecified: Secondary | ICD-10-CM | POA: Diagnosis not present

## 2023-06-03 DIAGNOSIS — R410 Disorientation, unspecified: Secondary | ICD-10-CM | POA: Diagnosis not present

## 2023-06-03 DIAGNOSIS — R0602 Shortness of breath: Secondary | ICD-10-CM | POA: Diagnosis not present

## 2023-06-03 DIAGNOSIS — I10 Essential (primary) hypertension: Secondary | ICD-10-CM | POA: Diagnosis not present

## 2023-06-03 DIAGNOSIS — I959 Hypotension, unspecified: Secondary | ICD-10-CM | POA: Diagnosis not present

## 2023-06-03 DIAGNOSIS — Z7401 Bed confinement status: Secondary | ICD-10-CM | POA: Diagnosis not present

## 2023-06-03 DIAGNOSIS — M129 Arthropathy, unspecified: Secondary | ICD-10-CM | POA: Diagnosis not present

## 2023-06-03 DIAGNOSIS — N12 Tubulo-interstitial nephritis, not specified as acute or chronic: Secondary | ICD-10-CM | POA: Diagnosis not present

## 2023-06-03 DIAGNOSIS — Z79899 Other long term (current) drug therapy: Secondary | ICD-10-CM | POA: Diagnosis not present

## 2023-06-03 DIAGNOSIS — R531 Weakness: Secondary | ICD-10-CM | POA: Diagnosis not present

## 2023-06-03 DIAGNOSIS — F0390 Unspecified dementia without behavioral disturbance: Secondary | ICD-10-CM | POA: Diagnosis not present

## 2023-06-03 DIAGNOSIS — R2689 Other abnormalities of gait and mobility: Secondary | ICD-10-CM | POA: Diagnosis not present

## 2023-06-03 DIAGNOSIS — S32511D Fracture of superior rim of right pubis, subsequent encounter for fracture with routine healing: Secondary | ICD-10-CM | POA: Diagnosis not present

## 2023-06-03 DIAGNOSIS — F331 Major depressive disorder, recurrent, moderate: Secondary | ICD-10-CM | POA: Diagnosis not present

## 2023-06-03 DIAGNOSIS — W19XXXA Unspecified fall, initial encounter: Secondary | ICD-10-CM | POA: Diagnosis not present

## 2023-06-03 DIAGNOSIS — E162 Hypoglycemia, unspecified: Secondary | ICD-10-CM | POA: Diagnosis not present

## 2023-06-03 DIAGNOSIS — S3210XA Unspecified fracture of sacrum, initial encounter for closed fracture: Secondary | ICD-10-CM | POA: Diagnosis not present

## 2023-06-03 DIAGNOSIS — F411 Generalized anxiety disorder: Secondary | ICD-10-CM | POA: Diagnosis not present

## 2023-06-03 DIAGNOSIS — Z471 Aftercare following joint replacement surgery: Secondary | ICD-10-CM | POA: Diagnosis not present

## 2023-06-03 DIAGNOSIS — J449 Chronic obstructive pulmonary disease, unspecified: Secondary | ICD-10-CM | POA: Diagnosis not present

## 2023-06-03 DIAGNOSIS — G4700 Insomnia, unspecified: Secondary | ICD-10-CM | POA: Diagnosis not present

## 2023-06-03 DIAGNOSIS — M47816 Spondylosis without myelopathy or radiculopathy, lumbar region: Secondary | ICD-10-CM | POA: Diagnosis not present

## 2023-06-03 DIAGNOSIS — R509 Fever, unspecified: Secondary | ICD-10-CM | POA: Diagnosis not present

## 2023-06-03 DIAGNOSIS — Z7982 Long term (current) use of aspirin: Secondary | ICD-10-CM | POA: Diagnosis not present

## 2023-06-03 DIAGNOSIS — Z96641 Presence of right artificial hip joint: Secondary | ICD-10-CM | POA: Diagnosis not present

## 2023-06-03 DIAGNOSIS — S72031D Displaced midcervical fracture of right femur, subsequent encounter for closed fracture with routine healing: Secondary | ICD-10-CM | POA: Diagnosis not present

## 2023-06-03 DIAGNOSIS — S32501D Unspecified fracture of right pubis, subsequent encounter for fracture with routine healing: Secondary | ICD-10-CM | POA: Diagnosis not present

## 2023-06-03 DIAGNOSIS — M5489 Other dorsalgia: Secondary | ICD-10-CM | POA: Diagnosis not present

## 2023-06-03 DIAGNOSIS — J9611 Chronic respiratory failure with hypoxia: Secondary | ICD-10-CM | POA: Diagnosis not present

## 2023-06-03 LAB — GLUCOSE, CAPILLARY
Glucose-Capillary: 78 mg/dL (ref 70–99)
Glucose-Capillary: 80 mg/dL (ref 70–99)

## 2023-06-03 LAB — COMPREHENSIVE METABOLIC PANEL
ALT: 16 U/L (ref 0–44)
AST: 24 U/L (ref 15–41)
Albumin: 2.5 g/dL — ABNORMAL LOW (ref 3.5–5.0)
Alkaline Phosphatase: 147 U/L — ABNORMAL HIGH (ref 38–126)
Anion gap: 4 — ABNORMAL LOW (ref 5–15)
BUN: 8 mg/dL (ref 8–23)
CO2: 31 mmol/L (ref 22–32)
Calcium: 8.3 mg/dL — ABNORMAL LOW (ref 8.9–10.3)
Chloride: 100 mmol/L (ref 98–111)
Creatinine, Ser: 0.86 mg/dL (ref 0.44–1.00)
GFR, Estimated: >60 mL/min (ref >60)
Glucose, Bld: 91 mg/dL (ref 70–99)
Potassium: 4.9 mmol/L (ref 3.5–5.1)
Sodium: 135 mmol/L (ref 135–145)
Total Bilirubin: 0.2 mg/dL (ref <1.2)
Total Protein: 5.8 g/dL — ABNORMAL LOW (ref 6.5–8.1)

## 2023-06-03 LAB — CBC
HCT: 28.9 % — ABNORMAL LOW (ref 36.0–46.0)
Hemoglobin: 8.6 g/dL — ABNORMAL LOW (ref 12.0–15.0)
MCH: 29.2 pg (ref 26.0–34.0)
MCHC: 29.8 g/dL — ABNORMAL LOW (ref 30.0–36.0)
MCV: 98 fL (ref 80.0–100.0)
Platelets: 318 10*3/uL (ref 150–400)
RBC: 2.95 MIL/uL — ABNORMAL LOW (ref 3.87–5.11)
RDW: 14.5 % (ref 11.5–15.5)
WBC: 6.7 10*3/uL (ref 4.0–10.5)
nRBC: 0 % (ref 0.0–0.2)

## 2023-06-03 LAB — CULTURE, BLOOD (ROUTINE X 2)
Culture: NO GROWTH
Special Requests: ADEQUATE

## 2023-06-03 MED ORDER — SULFAMETHOXAZOLE-TRIMETHOPRIM 800-160 MG PO TABS: 1.0000 | ORAL_TABLET | Freq: Two times a day (BID) | ORAL | 0 refills | Status: AC

## 2023-06-03 MED ORDER — MELATONIN 5 MG PO TABS
5.0000 mg | ORAL_TABLET | Freq: Once | ORAL | Status: AC
  Administered 2023-06-03: 5 mg via ORAL
  Filled 2023-06-03: qty 1

## 2023-06-03 NOTE — TOC Progression Note (Addendum)
Transition of Care [facility]) - Progression Note    Patient Details  Name: Andrea Larson MRN: [id number] Date of Birth: 07-Dec-1945  Transition of Care [facility]) CM/SW Contact  Otelia Santee, LCSW Phone Number: 06/03/2023, 9:10 AM  Clinical Narrative:    Unable to locate insurance authorization request in [place]. CSW called [employer] and confirmed insurance Berkley Harvey was initiated. Insurance request currently pending decision.    Expected Discharge Plan:  (TBD- SNF VS ALF) Barriers to Discharge: Continued Medical Work up  Expected Discharge Plan and Services In-house Referral: Clinical Social Work Discharge Planning Services: CM Consult Post Acute Care Choice: Skilled Nursing Facility, Resumption of Svcs/PTA Provider Living arrangements for the past 2 months: Assisted Living Facility ([facility])                 DME Arranged: N/A DME Agency: NA                   Social Determinants of Health (SDOH) Interventions SDOH Screenings   Food Insecurity: No Food Insecurity (06/01/2023)  Housing: Low Risk  (06/01/2023)  Transportation Needs: No Transportation Needs (06/01/2023)  Utilities: Not At Risk (06/01/2023)  Depression (PHQ2-9): Low Risk  (02/11/2023)  Tobacco Use: Medium Risk (05/29/2023)    Readmission Risk Interventions    06/01/2023    4:43 PM 05/31/2023   12:42 PM 05/10/2023    2:54 PM  Readmission Risk Prevention Plan  Transportation Screening Complete Complete Complete  PCP or Specialist Appt within 5-7 Days Complete Complete Complete  Home Care Screening Complete Complete Complete  Medication Review (RN CM) Complete Complete Complete

## 2023-06-03 NOTE — Care Management Important Message (Signed)
Important Message  Patient Details IM Letter given. Name: Andrea Larson MRN: [id number] Date of Birth: Jul 03, 1946   Important Message Given:  Yes - Medicare IM     Caren Macadam 06/03/2023, 12:29 PM

## 2023-06-03 NOTE — Discharge Summary (Signed)
Physician Discharge Summary  Andrea Larson OZH:[id number] DOB: [birth year] DOA: 05/29/2023  PCP: Caesar Bookman, NP  Admit date: 05/29/2023 Discharge date: 06/03/2023 30 Day Unplanned Readmission Risk Score    Flowsheet Row ED to Hosp-Admission (Current) from 05/29/2023 in [facility]  30 Day Unplanned Readmission Risk Score (%) 21.17 Filed at 06/03/2023 0801       This score is the patient's risk of an unplanned readmission within 30 days of being discharged (0 -100%). The score is based on dignosis, age, lab data, medications, orders, and past utilization.   Low:  0-14.9   Medium: 15-21.9   High: 22-29.9   Extreme: 30 and above          Admitted From: SNF Disposition: SNF  Recommendations for Outpatient Follow-up:  Follow up with PCP in 1-2 weeks Please obtain BMP/CBC in one week Please follow up with your PCP on the following pending results: Unresulted Labs (From admission, onward)     Start     Ordered   05/31/23 0500  CBC  Daily,   R      05/30/23 0507   05/31/23 0500  Comprehensive metabolic panel  Daily,   R      05/30/23 0507              Home Health: None Equipment/Devices: None  Discharge Condition: Stable CODE STATUS: DNR Diet recommendation: Cardiac  Subjective: Seen and examined.  No complaints.  Brief/Interim Summary: Andrea Larson is a 77 y.o. female with medical history significant of essential hypertension, GAD, dementia, CKD stage IIIb, COPD, vitamin B12 deficiency,  hyperlipidemia, chronic hypoxic respiratory failure 2 L oxygen at bedtime and recent femoral fracture status post hip arthroplasty 05/29/2023 presented to emergency department from nursing home due to altered mentation.  Upon arrival to ED, she was hemodynamically stable, CTA chest ruled out PE UA was not that impressive for UTI however interestingly CT abdomen indicated left-sided pyelonephritis so she was started on antibiotics and  admitted under hospital service.  Details below.   Acute metabolic encephalopathy: Could be secondary to infection or worsening dementia however currently patient is fully alert and oriented.  CT head normal.  Ammonia normal.   Left-sided pyelonephritis At the time of admission, no leukocytosis, she was afebrile and sepsis was ruled out.  Interestingly, CT abdomen is consistent with possible left pyelonephritis but UA is not consistent with UTI.  She has been started on antibiotics for that.  Urine culture growing >=100,000 COLONIES/mL STAPHYLOCOCCUS SIMULANS  20,000 COLONIES/mL ENTEROCOCCUS FAECALIS.  Based on sensitivities, transitioned her from IV to oral Bactrim DS and will discharge on 4 more days.   New onset atrial fibrillation ruled out/MAT ruled in: After admission, patient was found to have possibly new onset atrial fibrillation with RVR, cardiology consulted, they reviewed her EKG and opined that she does not have atrial fibrillation and instead she has MAT.  Currently in sinus rhythm.   Hypoglycemia: Patient had profound hypoglycemia with blood sugar of 21 early morning 05/31/2023. No history of diabetes, not on any antiglycemic medications.  Hemoglobin A1c 4.8.  C-peptide slightly low.  Blood sugar has remained stable/controlled since then.   History of right-sided hip fracture status post arthroplasty 11/24 Pain controlled on current medications, seen by PT OT, SNF recommended.  Patient discharging to SNF today.   CKD 3A Currently creatinine better than his baseline.   Hypokalemia: Resolved.   Hyperlipidemia -Continue Lipitor 20 mg daily  History of COPD Chronic hypoxic respiratory failure 2/3 L oxygen at baseline At baseline.  Continue home medications.   Dementia -Aricept 5 mg daily   Generalized anxiety disorder -Continue BuSpar 150 mg daily and ProAir 40 mg daily, holding Remeron in the setting of confusion/altered mentation.   Initial hypertension Intermittently  soft blood pressure, continue to hold amlodipine.   Chronic constipation -Continue Colace 100 mg twice daily  Discharge plan was discussed with patient and/or family member and they verbalized understanding and agreed with it.  Discharge Diagnoses:  Active Problems:   Acute metabolic encephalopathy   Pyelonephritis of left kidney   Hyperlipidemia   COPD (chronic obstructive pulmonary disease) (HCC)   Essential hypertension   Chronic kidney disease (CKD), stage III (moderate) (HCC)   Chronic hypoxic respiratory failure (HCC)   Dementia without behavioral disturbance (HCC)   GAD (generalized anxiety disorder)   Multifocal atrial tachycardia (HCC)   Mild aortic stenosis    Discharge Instructions   Allergies as of 06/03/2023       Reactions   Lipitor [atorvastatin] Other (See Comments)   Myalgias        Medication List     STOP taking these medications    amLODipine 5 MG tablet Commonly known as: NORVASC   ondansetron 4 MG tablet Commonly known as: ZOFRAN       TAKE these medications    acetaminophen 325 MG tablet Commonly known as: TYLENOL Take 650 mg by mouth every 6 (six) hours as needed for mild pain or headache.   aspirin 81 MG chewable tablet Commonly known as: Aspirin Childrens Chew 1 tablet (81 mg total) by mouth 2 (two) times daily with a meal.   buPROPion 150 MG 24 hr tablet Commonly known as: WELLBUTRIN XL TAKE ONE TABLET BY MOUTH DAILY What changed: when to take this   clonazePAM 0.5 MG tablet Commonly known as: KLONOPIN Take 1 tablet (0.5 mg total) by mouth 2 (two) times daily as needed for up to 7 days for anxiety. What changed: when to take this   cyanocobalamin 1000 MCG tablet Commonly known as: VITAMIN B12 Take 1 tablet (1,000 mcg total) by mouth daily.   docusate sodium 100 MG capsule Commonly known as: COLACE Take 1 capsule (100 mg total) by mouth 2 (two) times daily.   donepezil 5 MG tablet Commonly known as: ARICEPT TAKE  ONE TABLET BY MOUTH AT BEDTIME   Ensure Active Liqd Take 1 Can by mouth daily. What changed: Another medication with the same name was removed. Continue taking this medication, and follow the directions you see here.   FLUoxetine 40 MG capsule Commonly known as: PROZAC TAKE ONE CAPSULE BY MOUTH DAILY What changed: when to take this   fluticasone 50 MCG/ACT nasal spray Commonly known as: FLONASE USE TWO SPRAYS in each nostril ONCE DAILY. What changed: See the new instructions.   Flutter Devi Use as directed   folic acid 1 MG tablet Commonly known as: FOLVITE TAKE ONE TABLET BY MOUTH DAILY What changed: when to take this   HYDROcodone-acetaminophen 5-325 MG tablet Commonly known as: NORCO/VICODIN Take 1 tablet by mouth every 4 (four) hours as needed for moderate pain (pain score 4-6).   ipratropium 0.02 % nebulizer solution Commonly known as: ATROVENT NEBULIZE CONTENTS OF 1 VIAL 3 TIMES A DAY What changed: See the new instructions.   levalbuterol 0.63 MG/3ML nebulizer solution Commonly known as: XOPENEX USE ONE vial via nebulization every SIX hours as needed FOR SHORTNESS OF  BREATH OR wheezing. What changed: See the new instructions.   melatonin 3 MG Tabs tablet Take 3 mg by mouth at bedtime.   memantine 10 MG tablet Commonly known as: NAMENDA TAKE ONE TABLET BY MOUTH TWICE DAILY What changed: when to take this   mirtazapine 15 MG tablet Commonly known as: REMERON TAKE ONE TABLET BY MOUTH AT BEDTIME   montelukast 10 MG tablet Commonly known as: SINGULAIR TAKE ONE TABLET BY MOUTH DAILY What changed: when to take this   multivitamin with minerals Tabs tablet Take 1 tablet by mouth daily.   omeprazole 20 MG capsule Commonly known as: PRILOSEC Take 2 capsules (40 mg total) by mouth daily as needed.   omeprazole 20 MG tablet Commonly known as: PRILOSEC OTC Take 40 mg by mouth daily.   polyethylene glycol 17 g packet Commonly known as: MIRALAX /  GLYCOLAX Take 17 g by mouth daily as needed for mild constipation.   rosuvastatin 20 MG tablet Commonly known as: CRESTOR TAKE ONE TABLET BY MOUTH DAILY What changed: when to take this   sulfamethoxazole-trimethoprim 800-160 MG tablet Commonly known as: BACTRIM DS Take 1 tablet by mouth every 12 (twelve) hours for 4 days.   thiamine 100 MG tablet Commonly known as: VITAMIN B1 Take 1 tablet (100 mg total) by mouth daily.   Trelegy Ellipta 200-62.5-25 MCG/ACT Aepb Generic drug: Fluticasone-Umeclidin-Vilant INHALE 1 PUFF INTO THE LUNGS ONCE DAILY AS DIRECTED. What changed: See the new instructions.        Follow-up Information     Ngetich, Dinah C, NP Follow up in 1 week(s).   Specialty: Family Medicine Contact information: [address] [place] [address] [phone]                Allergies  Allergen Reactions   Lipitor [Atorvastatin] Other (See Comments)    Myalgias    Consultations: Cardiology   Procedures/Studies: ECHOCARDIOGRAM COMPLETE  Result Date: 05/30/2023    ECHOCARDIOGRAM REPORT   Patient Name:   Andrea Larson Date of Exam: 05/30/2023 Medical Rec #:  [id number]         Height:       62.0 in Accession #:    [id number]        Weight:       124.8 lb Date of Birth:  08-07-1945         BSA:          1.564 m Patient Age:    77 years          BP:           111/62 mmHg Patient Gender: F                 HR:           103 bpm. Exam Location:  Inpatient Procedure: 2D Echo, Cardiac Doppler and Color Doppler Indications:    a flutter  History:        Patient has prior history of Echocardiogram examinations, most                 recent 11/27/2019. CAD, COPD, Aortic Valve Disease; Risk                 Factors:Hypertension.  Sonographer:    Harriette Bouillon Referring Phys: [id number] SUBRINA SUNDIL  Sonographer Comments: Technically challenging study due to limited acoustic windows. IMPRESSIONS  1. Left ventricular ejection fraction, by estimation, is 60 to 65%. The  left ventricle has normal function.  The left ventricle has no regional wall motion abnormalities. Left ventricular diastolic function could not be evaluated.  2. Right ventricular systolic function is normal. The right ventricular size is normal.  3. The mitral valve is degenerative. No evidence of mitral valve regurgitation. Moderate mitral annular calcification.  4. The aortic valve was not well visualized. Aortic valve regurgitation is not visualized. Moderate aortic valve stenosis. Aortic valve mean gradient measures 14.0 mmHg. Aortic valve Vmax measures 2.39 m/s. DVI 0.3 and low SVI of 19. 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  5. The inferior vena cava is normal in size with greater than 50% respiratory variability, suggesting right atrial pressure of 3 mmHg. FINDINGS  Left Ventricle: Left ventricular ejection fraction, by estimation, is 60 to 65%. The left ventricle has normal function. The left ventricle has no regional wall motion abnormalities. The left ventricular internal cavity size was normal in size. There is  no left ventricular hypertrophy. Left ventricular diastolic function could not be evaluated. Right Ventricle: The right ventricular size is normal. No increase in right ventricular wall thickness. Right ventricular systolic function is normal. Left Atrium: Left atrial size was normal in size. Right Atrium: Right atrial size was normal in size. Pericardium: There is no evidence of pericardial effusion. Mitral Valve: The mitral valve is degenerative in appearance. There is moderate thickening of the mitral valve leaflet(s). There is mild calcification of the mitral valve leaflet(s). Moderate mitral annular calcification. No evidence of mitral valve regurgitation. Tricuspid Valve: The tricuspid valve is normal in structure. Tricuspid valve regurgitation is not demonstrated. No evidence of tricuspid stenosis. Aortic Valve: The aortic valve was not well  visualized. Aortic valve regurgitation is not visualized. Moderate aortic stenosis is present. Aortic valve mean gradient measures 14.0 mmHg. Aortic valve peak gradient measures 22.9 mmHg. Aortic valve area, by  VTI measures 0.59 cm. Pulmonic Valve: The pulmonic valve was normal in structure. Pulmonic valve regurgitation is not visualized. No evidence of pulmonic stenosis. Aorta: The aortic root is normal in size and structure. Venous: The inferior vena cava is normal in size with greater than 50% respiratory variability, suggesting right atrial pressure of 3 mmHg. IAS/Shunts: No atrial level shunt detected by color flow Doppler.  LEFT VENTRICLE PLAX 2D LVIDd:         3.80 cm LVIDs:         2.50 cm LV PW:         1.00 cm LV IVS:        1.00 cm LVOT diam:     1.60 cm LV SV:         29 LV SV Index:   19 LVOT Area:     2.01 cm  IVC IVC diam: 1.80 cm LEFT ATRIUM         Index LA diam:    3.30 cm 2.11 cm/m  AORTIC VALVE AV Area (Vmax):    0.56 cm AV Area (Vmean):   0.55 cm AV Area (VTI):     0.59 cm AV Vmax:           239.20 cm/s AV Vmean:          171.200 cm/s AV VTI:            0.491 m AV Peak Grad:      22.9 mmHg AV Mean Grad:      14.0 mmHg LVOT Vmax:         67.00 cm/s LVOT Vmean:  47.000 cm/s LVOT VTI:          0.145 m LVOT/AV VTI ratio: 0.30  AORTA Ao Root diam: 2.60 cm  SHUNTS Systemic VTI:  0.14 m Systemic Diam: 1.60 cm Armanda Magic MD Electronically signed by Armanda Magic MD Signature Date/Time: 05/30/2023/3:02:36 PM    Final    CT Head Wo Contrast  Result Date: 05/29/2023 CLINICAL DATA:  Altered mental status. EXAM: CT HEAD WITHOUT CONTRAST TECHNIQUE: Contiguous axial images were obtained from the base of the skull through the vertex without intravenous contrast. RADIATION DOSE REDUCTION: This exam was performed according to the departmental dose-optimization program which includes automated exposure control, adjustment of the mA and/or kV according to patient size and/or use of iterative  reconstruction technique. COMPARISON:  Head CT dated 04/07/2021. FINDINGS: Brain: Mild age-related atrophy and chronic microvascular ischemic changes. There is no acute intracranial hemorrhage. No mass effect or midline shift. No extra-axial fluid collection. Vascular: No hyperdense vessel or unexpected calcification. Skull: Normal. Negative for fracture or focal lesion. Sinuses/Orbits: Mild mucoperiosteal thickening of paranasal sinuses. The mastoid air cells are clear. Other: None IMPRESSION: 1. No acute intracranial pathology. 2. Mild age-related atrophy and chronic microvascular ischemic changes. Electronically Signed   By: Elgie Collard M.D.   On: 05/29/2023 23:36   CT ABDOMEN PELVIS W CONTRAST  Addendum Date: 05/29/2023   ADDENDUM REPORT: 05/29/2023 19:21 ADDENDUM: After further review and comparison to today's chest CT, there are questionable areas of decreased perfusion in the left kidney, mostly upper and lower poles, but there is extensive respiratory motion which limits evaluation. Recommend clinical correlation for possible pyelonephritis. Electronically Signed   By: Charlett Nose M.D.   On: 05/29/2023 19:21   Result Date: 05/29/2023 CLINICAL DATA:  Abdominal pain, acute, nonlocalized EXAM: CT ABDOMEN AND PELVIS WITH CONTRAST TECHNIQUE: Multidetector CT imaging of the abdomen and pelvis was performed using the standard protocol following bolus administration of intravenous contrast. RADIATION DOSE REDUCTION: This exam was performed according to the departmental dose-optimization program which includes automated exposure control, adjustment of the mA and/or kV according to patient size and/or use of iterative reconstruction technique. CONTRAST:  80mL OMNIPAQUE IOHEXOL 350 MG/ML SOLN COMPARISON:  03/27/2019 FINDINGS: Lower chest: No acute abnormality. Coronary artery disease, aortic atherosclerosis. Hepatobiliary: Small layering gallstones within the gallbladder. No focal hepatic abnormality or  biliary ductal dilatation. Pancreas: No focal abnormality or ductal dilatation. Spleen: No focal abnormality.  Normal size. Adrenals/Urinary Tract: No adrenal abnormality. No focal renal abnormality. No stones or hydronephrosis. Urinary bladder decompressed. Stomach/Bowel: Sigmoid diverticulosis. No active diverticulitis. Stomach and small bowel decompressed, unremarkable. Vascular/Lymphatic: Diffuse aortoiliac atherosclerosis. No evidence of aneurysm or adenopathy. Reproductive: Uterus and adnexa unremarkable.  No mass. Other: No free fluid or free air. Periumbilical hernias noted containing fat on the left and fat and a portion of the anterior wall of the transverse colon on the right. No bowel obstruction. Musculoskeletal: No acute bony abnormality. IMPRESSION: No acute findings in the abdomen or pelvis. Diffuse aortoiliac atherosclerosis, coronary artery disease. Cholelithiasis.  No evidence of acute cholecystitis. Periumbilical ventral hernias containing fat and anterior wall of the transverse colon. No bowel obstruction. Electronically Signed: By: Charlett Nose M.D. On: 05/29/2023 19:13   CT Angio Chest PE W and/or Wo Contrast  Result Date: 05/29/2023 CLINICAL DATA:  Pulmonary embolism (PE) suspected, high prob. Altered mental status. EXAM: CT ANGIOGRAPHY CHEST WITH CONTRAST TECHNIQUE: Multidetector CT imaging of the chest was performed using the standard protocol during bolus administration of intravenous  contrast. Multiplanar CT image reconstructions and MIPs were obtained to evaluate the vascular anatomy. RADIATION DOSE REDUCTION: This exam was performed according to the departmental dose-optimization program which includes automated exposure control, adjustment of the mA and/or kV according to patient size and/or use of iterative reconstruction technique. CONTRAST:  80mL OMNIPAQUE IOHEXOL 350 MG/ML SOLN COMPARISON:  01/08/2023 FINDINGS: Cardiovascular: No filling defects in the pulmonary arteries to  suggest pulmonary emboli. Heart is normal size. Aorta is normal caliber. Coronary artery and aortic atherosclerosis. Mediastinum/Nodes: No mediastinal, hilar, or axillary adenopathy. Trachea and esophagus are unremarkable. Thyroid unremarkable. Lungs/Pleura: No confluent opacities or effusions. Upper Abdomen: No acute findings Musculoskeletal: No acute bony abnormality. Numerous moderate to severe compression fractures in the midthoracic spine, unchanged since prior study. Review of the MIP images confirms the above findings. IMPRESSION: No evidence of pulmonary embolus. No acute cardiopulmonary disease. Coronary artery disease. Aortic Atherosclerosis (ICD10-I70.0). Electronically Signed   By: Charlett Nose M.D.   On: 05/29/2023 19:17   DG Chest Port 1 View  Result Date: 05/29/2023 CLINICAL DATA:  Altered mental status. EXAM: PORTABLE CHEST 1 VIEW COMPARISON:  May 09, 2023. FINDINGS: The heart size and mediastinal contours are within normal limits. Both lungs are clear. The visualized skeletal structures are unremarkable. IMPRESSION: No active disease. Electronically Signed   By: Lupita Raider M.D.   On: 05/29/2023 18:11   DG Pelvis Portable  Result Date: 05/09/2023 CLINICAL DATA:  Right hip replacement.  [id number].  Postoperative. EXAM: PORTABLE PELVIS 1-2 VIEWS COMPARISON:  CT of the pelvis 05/07/2023 FINDINGS: Skin staples overlie the right hip region. The right femoral head and neck have been resected. A total right hip replacement has been inserted. No acute hardware complication is seen. Hardware alignment appears physiologic on this single view. Osteopenia. Recent fractures of the right superior and inferior pubic rami are again shown with mild displacement. No new fracture has become apparent. The proximal left femur is unremarkable. There is spurring of the SI joints and pubic symphysis. Patchy calcific plaques both superficial femoral arteries. Patchy soft tissue gas extends over the operative  bed on the right. IMPRESSION: 1. Status post total right hip replacement without evidence of acute hardware complication. 2. Recent fractures of the right superior and inferior pubic rami are again shown with mild displacement. 3. Osteopenia and degenerative change. 4. Atherosclerosis. Electronically Signed   By: Almira Bar M.D.   On: 05/09/2023 22:36   DG HIP UNILAT WITH PELVIS 1V RIGHT  Result Date: 05/09/2023 CLINICAL DATA:  Right hip arthroplasty. EXAM: DG HIP (WITH OR WITHOUT PELVIS) 1V RIGHT COMPARISON:  Pelvic CT dated 05/07/2023. FINDINGS: Six intraoperative fluoroscopic spot images of the right hip provided. The cumulative air Karma is 1.01 mGy. There has been interval placement of a right hip arthroplasty. IMPRESSION: Intraoperative fluoroscopic spot images of the right hip. Electronically Signed   By: Elgie Collard M.D.   On: 05/09/2023 21:41   DG C-Arm 1-60 Min-No Report  Result Date: 05/09/2023 Fluoroscopy was utilized by the requesting physician.  No radiographic interpretation.   DG CHEST PORT 1 VIEW  Result Date: 05/09/2023 CLINICAL DATA:  10028 Wheezing [id number] EXAM: PORTABLE CHEST 1 VIEW COMPARISON:  CXR 01/08/23 FINDINGS: No pleural effusion. No pneumothorax. Likely unchanged cardiac and mediastinal contours when accounting for differences in lung volumes. Compared to prior exam there are increased bilateral perihilar and interstitial opacities, which can be seen in the setting of atypical infection or bronchitis. Radiographically apparent displaced rib fractures. Visualized upper  abdomen unremarkable. IMPRESSION: Increased bilateral perihilar and interstitial opacities, which can be seen in the setting of atypical infection or bronchitis. There is clinically indicated, this could be further assessed with a chest CT. Electronically Signed   By: Lorenza Cambridge M.D.   On: 05/09/2023 13:14   CT PELVIS WO CONTRAST  Result Date: 05/08/2023 CLINICAL DATA:  Hip trauma fall EXAM: CT  PELVIS WITHOUT CONTRAST TECHNIQUE: Multidetector CT imaging of the pelvis was performed following the standard protocol without intravenous contrast. RADIATION DOSE REDUCTION: This exam was performed according to the departmental dose-optimization program which includes automated exposure control, adjustment of the mA and/or kV according to patient size and/or use of iterative reconstruction technique. COMPARISON:  05/07/2023 FINDINGS: Urinary Tract:  Urinary bladder is unremarkable Bowel: No acute bowel wall thickening. Diverticular disease of the sigmoid colon. Vascular/Lymphatic: Aortic atherosclerosis. No aneurysm. No suspicious lymph nodes Reproductive:  Uterus unremarkable.  No adnexal mass. Other:  Negative for pelvic effusion or free air. Musculoskeletal: Acute nondisplaced right superior and inferior pubic rami fractures. Acute mildly impacted subcapital right femoral neck fracture. No femoral head dislocation. Pubic symphysis appears intact. Mild asymmetric enlargement of the right operator muscles consistent with soft tissue injury. IMPRESSION: 1. Acute nondisplaced right superior and inferior pubic rami fractures. 2. Acute mildly impacted subcapital right femoral neck fracture. 3. Aortic atherosclerosis. Aortic Atherosclerosis (ICD10-I70.0). Electronically Signed   By: Jasmine Pang M.D.   On: 05/08/2023 00:27   CT Lumbar Spine Wo Contrast  Result Date: 05/07/2023 CLINICAL DATA:  Trauma EXAM: CT LUMBAR SPINE WITHOUT CONTRAST TECHNIQUE: Multidetector CT imaging of the lumbar spine was performed without intravenous contrast administration. Multiplanar CT image reconstructions were also generated. RADIATION DOSE REDUCTION: This exam was performed according to the departmental dose-optimization program which includes automated exposure control, adjustment of the mA and/or kV according to patient size and/or use of iterative reconstruction technique. COMPARISON:  None Available. FINDINGS: Segmentation: 5  lumbar type vertebrae. Alignment: Grade 1 retrolisthesis at L1-2 and L2-3. Vertebrae: Wedge compression fracture of T10 with 50% height loss. This is chronic. No acute fracture. Paraspinal and other soft tissues: Calcific aortic atherosclerosis. Disc levels: No bony spinal canal stenosis.  No neural impingement. IMPRESSION: 1. No acute fracture or static subluxation of the lumbar spine. 2. Chronic wedge compression fracture of T10 with 50% height loss. Aortic Atherosclerosis (ICD10-I70.0). Electronically Signed   By: Deatra Robinson M.D.   On: 05/07/2023 23:53   DG Pelvis 1-2 Views  Result Date: 05/07/2023 CLINICAL DATA:  Pain EXAM: PELVIS - 1-2 VIEW COMPARISON:  None Available. FINDINGS: The bones are osteopenic. Right hip is slightly rotated which limits evaluation. There are findings suspicious for nondisplaced fracture of the right superior pubic ramus. There is no dislocation. Soft tissues are within normal limits. IMPRESSION: Findings suspicious for nondisplaced fracture of the right superior pubic ramus. Limited evaluation of the right hip secondary to rotation. Recommend dedicated hip x-ray for further evaluation if clinically warranted. Electronically Signed   By: Darliss Cheney M.D.   On: 05/07/2023 22:30     Discharge Exam: Vitals:   06/03/23 0413 06/03/23 0852  BP: 118/61   Pulse: 84   Resp: 16   Temp: 98.1 F (36.7 C)   SpO2: 100% 98%   Vitals:   06/02/23 2051 06/03/23 0413 06/03/23 0500 06/03/23 0852  BP:  118/61    Pulse:  84    Resp:  16    Temp:  98.1 F (36.7 C)    TempSrc:  Oral    SpO2: 94% 100%  98%  Weight:   54.6 kg   Height:        General: Pt is alert, awake, not in acute distress Cardiovascular: RRR, S1/S2 +, no rubs, no gallops Respiratory: CTA bilaterally, no wheezing, no rhonchi Abdominal: Soft, NT, ND, bowel sounds + Extremities: no edema, no cyanosis    The results of significant diagnostics from this hospitalization (including imaging, microbiology,  ancillary and laboratory) are listed below for reference.     Microbiology: Recent Results (from the past 240 hour(s))  Blood Culture (routine x 2)     Status: Abnormal   Collection Time: 05/29/23  3:28 PM   Specimen: BLOOD RIGHT ARM  Result Value Ref Range Status   Specimen Description   Final    BLOOD RIGHT ARM Performed at [facility], [address], [place], [place] [address]    Special Requests   Final    BOTTLES DRAWN AEROBIC AND ANAEROBIC Blood Culture adequate volume Performed at [facility], [address], [place], [place] [address]    Culture  Setup Time   Final    GRAM POSITIVE COCCI ANAEROBIC BOTTLE ONLY CRITICAL RESULT CALLED TO, READ BACK BY AND VERIFIED WITH: PHARMD JUSTIN LEGGE ON 05/30/23 @ 1746 BY DRT    Culture (A)  Final    STAPHYLOCOCCUS EPIDERMIDIS THE SIGNIFICANCE OF ISOLATING THIS ORGANISM FROM A SINGLE SET OF BLOOD CULTURES WHEN MULTIPLE SETS ARE DRAWN IS UNCERTAIN. PLEASE NOTIFY THE MICROBIOLOGY DEPARTMENT WITHIN ONE WEEK IF SPECIATION AND SENSITIVITIES ARE REQUIRED. Performed at [facility], [address], [place], [place] [address]    Report Status 06/01/2023 FINAL  Final  Blood Culture ID Panel (Reflexed)     Status: Abnormal   Collection Time: 05/29/23  3:28 PM  Result Value Ref Range Status   Enterococcus faecalis NOT DETECTED NOT DETECTED Final   Enterococcus Faecium NOT DETECTED NOT DETECTED Final   Listeria monocytogenes NOT DETECTED NOT DETECTED Final   Staphylococcus species DETECTED (A) NOT DETECTED Final    Comment: CRITICAL RESULT CALLED TO, READ BACK BY AND VERIFIED WITH: PHARMD JUSTIN LEGGE ON 05/30/23 @ 1746 BY DRT    Staphylococcus aureus (BCID) NOT DETECTED NOT DETECTED Final   Staphylococcus epidermidis DETECTED (A) NOT DETECTED Final    Comment: Methicillin (oxacillin) resistant coagulase negative staphylococcus. Possible blood culture contaminant (unless isolated from more than one blood  culture draw or clinical case suggests pathogenicity). No antibiotic treatment is indicated for blood  culture contaminants. CRITICAL RESULT CALLED TO, READ BACK BY AND VERIFIED WITH: PHARMD JUSTIN LEGGE ON 05/30/23 @ 1746 BY DRT    Staphylococcus lugdunensis NOT DETECTED NOT DETECTED Final   Streptococcus species NOT DETECTED NOT DETECTED Final   Streptococcus agalactiae NOT DETECTED NOT DETECTED Final   Streptococcus pneumoniae NOT DETECTED NOT DETECTED Final   Streptococcus pyogenes NOT DETECTED NOT DETECTED Final   A.calcoaceticus-baumannii NOT DETECTED NOT DETECTED Final   Bacteroides fragilis NOT DETECTED NOT DETECTED Final   Enterobacterales NOT DETECTED NOT DETECTED Final   Enterobacter cloacae complex NOT DETECTED NOT DETECTED Final   Escherichia coli NOT DETECTED NOT DETECTED Final   Klebsiella aerogenes NOT DETECTED NOT DETECTED Final   Klebsiella oxytoca NOT DETECTED NOT DETECTED Final   Klebsiella pneumoniae NOT DETECTED NOT DETECTED Final   Proteus species NOT DETECTED NOT DETECTED Final   Salmonella species NOT DETECTED NOT DETECTED Final   Serratia marcescens NOT DETECTED NOT DETECTED Final  Haemophilus influenzae NOT DETECTED NOT DETECTED Final   Neisseria meningitidis NOT DETECTED NOT DETECTED Final   Pseudomonas aeruginosa NOT DETECTED NOT DETECTED Final   Stenotrophomonas maltophilia NOT DETECTED NOT DETECTED Final   Candida albicans NOT DETECTED NOT DETECTED Final   Candida auris NOT DETECTED NOT DETECTED Final   Candida glabrata NOT DETECTED NOT DETECTED Final   Candida krusei NOT DETECTED NOT DETECTED Final   Candida parapsilosis NOT DETECTED NOT DETECTED Final   Candida tropicalis NOT DETECTED NOT DETECTED Final   Cryptococcus neoformans/gattii NOT DETECTED NOT DETECTED Final   Methicillin resistance mecA/C DETECTED (A) NOT DETECTED Final    Comment: CRITICAL RESULT CALLED TO, READ BACK BY AND VERIFIED WITH: PHARMD JUSTIN LEGGE ON 05/30/23 @ 1746 BY  DRT Performed at [facility], [address] [place], [place] [address]   Resp panel by RT-PCR (RSV, Flu A&B, Covid) Anterior Nasal Swab     Status: None   Collection Time: 05/29/23  3:29 PM   Specimen: Anterior Nasal Swab  Result Value Ref Range Status   SARS Coronavirus 2 by RT PCR NEGATIVE NEGATIVE Final    Comment: (NOTE) SARS-CoV-2 target nucleic acids are NOT DETECTED.  The SARS-CoV-2 RNA is generally detectable in upper respiratory specimens during the acute phase of infection. The lowest concentration of SARS-CoV-2 viral copies this assay can detect is 138 copies/mL. A negative result does not preclude SARS-Cov-2 infection and should not be used as the sole basis for treatment or other patient management decisions. A negative result may occur with  improper specimen collection/handling, submission of specimen other than nasopharyngeal swab, presence of viral mutation(s) within the areas targeted by this assay, and inadequate number of viral copies(<138 copies/mL). A negative result must be combined with clinical observations, patient history, and epidemiological information. The expected result is Negative.  Fact Sheet for Patients:  BloggerCourse.com  Fact Sheet for Healthcare Providers:  SeriousBroker.it  This test is no t yet approved or cleared by the [place] FDA and  has been authorized for detection and/or diagnosis of SARS-CoV-2 by FDA under an Emergency Use Authorization (EUA). This EUA will remain  in effect (meaning this test can be used) for the duration of the COVID-19 declaration under Section 564(b)(1) of the Act, 21 U.S.C.section 360bbb-3(b)(1), unless the authorization is terminated  or revoked sooner.       Influenza A by PCR NEGATIVE NEGATIVE Final   Influenza B by PCR NEGATIVE NEGATIVE Final    Comment: (NOTE) The Xpert Xpress SARS-CoV-2/FLU/RSV plus assay is intended as an aid in the  diagnosis of influenza from Nasopharyngeal swab specimens and should not be used as a sole basis for treatment. Nasal washings and aspirates are unacceptable for Xpert Xpress SARS-CoV-2/FLU/RSV testing.  Fact Sheet for Patients: BloggerCourse.com  Fact Sheet for Healthcare Providers: SeriousBroker.it  This test is not yet approved or cleared by the [place] FDA and has been authorized for detection and/or diagnosis of SARS-CoV-2 by FDA under an Emergency Use Authorization (EUA). This EUA will remain in effect (meaning this test can be used) for the duration of the COVID-19 declaration under Section 564(b)(1) of the Act, 21 U.S.C. section 360bbb-3(b)(1), unless the authorization is terminated or revoked.     Resp Syncytial Virus by PCR NEGATIVE NEGATIVE Final    Comment: (NOTE) Fact Sheet for Patients: BloggerCourse.com  Fact Sheet for Healthcare Providers: SeriousBroker.it  This test is not yet approved or cleared by the [place] and has been authorized  for detection and/or diagnosis of SARS-CoV-2 by FDA under an Emergency Use Authorization (EUA). This EUA will remain in effect (meaning this test can be used) for the duration of the COVID-19 declaration under Section 564(b)(1) of the Act, 21 U.S.C. section 360bbb-3(b)(1), unless the authorization is terminated or revoked.  Performed at [facility], [address], [place], [place] [address]   Blood Culture (routine x 2)     Status: None   Collection Time: 05/29/23  3:43 PM   Specimen: BLOOD  Result Value Ref Range Status   Specimen Description   Final    BLOOD BLOOD LEFT HAND Performed at [facility], [address], [place], [place] [address]    Special Requests   Final    BOTTLES DRAWN AEROBIC AND ANAEROBIC Blood Culture adequate volume Performed at [facility], [address], [place], [place] [address]    Culture   Final    NO GROWTH 5 DAYS Performed at [facility], [address], [place], [place] [address]    Report Status 06/03/2023 FINAL  Final  Urine Culture     Status: Abnormal   Collection Time: 05/29/23  6:43 PM   Specimen: Urine, Catheterized  Result Value Ref Range Status   Specimen Description   Final    URINE, CATHETERIZED Performed at [facility], [address] [place], [place] [address]    Special Requests   Final    NONE Performed at [facility], [address], [place], [place] [address]    Culture (A)  Final    >=100,000 COLONIES/mL STAPHYLOCOCCUS SIMULANS 20,000 COLONIES/mL ENTEROCOCCUS FAECALIS    Report Status 06/02/2023 FINAL  Final   Organism ID, Bacteria STAPHYLOCOCCUS SIMULANS (A)  Final   Organism ID, Bacteria ENTEROCOCCUS FAECALIS (A)  Final      Susceptibility   Enterococcus faecalis - MIC*    AMPICILLIN <=2 SENSITIVE Sensitive     NITROFURANTOIN <=16 SENSITIVE Sensitive     VANCOMYCIN 1 SENSITIVE Sensitive     * 20,000 COLONIES/mL ENTEROCOCCUS FAECALIS   Staphylococcus simulans - MIC*    CIPROFLOXACIN >=8 RESISTANT Resistant     GENTAMICIN <=0.5 SENSITIVE Sensitive     NITROFURANTOIN <=16 SENSITIVE Sensitive     OXACILLIN >=4 RESISTANT Resistant     TETRACYCLINE <=1 SENSITIVE Sensitive     VANCOMYCIN <=0.5 SENSITIVE Sensitive     TRIMETH/SULFA <=10 SENSITIVE Sensitive     CLINDAMYCIN RESISTANT Resistant     RIFAMPIN <=0.5 SENSITIVE Sensitive     Inducible Clindamycin POSITIVE Resistant     * >=100,000 COLONIES/mL STAPHYLOCOCCUS SIMULANS     Labs: BNP (last 3 results) No results for input(s): "BNP" in the last 8760 hours. Basic Metabolic Panel: Recent Labs  Lab 05/30/23 0500 05/31/23 0550 06/01/23 0603 06/02/23 0541 06/03/23 0504  NA 137 139 138 133* 135  K 3.9 4.1 3.3* 4.2 4.9  CL 103 104 103 100 100  CO2 25 21* 29  29 31   GLUCOSE 86 30* 97 83 91  BUN 12 14 12 8 8   CREATININE 0.95 0.90 0.80 0.78 0.86  CALCIUM 8.2* 8.3* 7.9* 8.0* 8.3*   Liver Function Tests: Recent Labs  Lab 05/30/23 0500 05/31/23 0550 06/01/23 0603 06/02/23 0541 06/03/23 0504  AST 25 21 18 18 24   ALT 14 13 12 13 16   ALKPHOS 172* 144* 125 137* 147*  BILITOT 0.4 0.9 0.4 0.3 0.2  PROT 6.3* 5.5* 5.3* 5.5*  5.8*  ALBUMIN 2.8* 2.5* 2.3* 2.4* 2.5*   No results for input(s): "LIPASE", "AMYLASE" in the last 168 hours. Recent Labs  Lab 05/30/23 0500  AMMONIA 26   CBC: Recent Labs  Lab 05/29/23 1528 05/29/23 1535 05/30/23 0500 05/31/23 0550 06/01/23 0603 06/02/23 0541 06/03/23 0504  WBC 8.8  --  11.7* 8.4 6.2 6.1 6.7  NEUTROABS 7.0  --   --   --   --   --   --   HGB 9.7*   < > 9.2* 8.4* 8.1* 8.6* 8.6*  HCT 31.3*   < > 30.6* 29.2* 27.5* 29.3* 28.9*  MCV 96.6  --  97.5 101.0* 99.3 100.3* 98.0  PLT 449*  --  418* 350 307 272 318   < > = values in this interval not displayed.   Cardiac Enzymes: No results for input(s): "CKTOTAL", "CKMB", "CKMBINDEX", "TROPONINI" in the last 168 hours. BNP: Invalid input(s): "POCBNP" CBG: Recent Labs  Lab 06/02/23 1641 06/02/23 1959 06/02/23 2353 06/03/23 0415 06/03/23 0735  GLUCAP 103* 130* 88 78 80   D-Dimer No results for input(s): "DDIMER" in the last 72 hours. Hgb A1c No results for input(s): "HGBA1C" in the last 72 hours. Lipid Profile No results for input(s): "CHOL", "HDL", "LDLCALC", "TRIG", "CHOLHDL", "LDLDIRECT" in the last 72 hours. Thyroid function studies No results for input(s): "TSH", "T4TOTAL", "T3FREE", "THYROIDAB" in the last 72 hours.  Invalid input(s): "FREET3" Anemia work up No results for input(s): "VITAMINB12", "FOLATE", "FERRITIN", "TIBC", "IRON", "RETICCTPCT" in the last 72 hours. Urinalysis    Component Value Date/Time   COLORURINE STRAW (A) 05/29/2023 1843   APPEARANCEUR CLEAR 05/29/2023 1843   LABSPEC 1.031 (H) 05/29/2023 1843   PHURINE 7.0  05/29/2023 1843   GLUCOSEU NEGATIVE 05/29/2023 1843   GLUCOSEU 100 (A) 03/20/2021 1556   HGBUR NEGATIVE 05/29/2023 1843   HGBUR negative 06/20/2010 1406   BILIRUBINUR NEGATIVE 05/29/2023 1843   BILIRUBINUR Negative 04/03/2021 1536   KETONESUR 5 (A) 05/29/2023 1843   PROTEINUR NEGATIVE 05/29/2023 1843   UROBILINOGEN negative (A) 04/03/2021 1536   UROBILINOGEN 0.2 03/20/2021 1556   NITRITE NEGATIVE 05/29/2023 1843   LEUKOCYTESUR NEGATIVE 05/29/2023 1843   Sepsis Labs Recent Labs  Lab 05/31/23 0550 06/01/23 0603 06/02/23 0541 06/03/23 0504  WBC 8.4 6.2 6.1 6.7   Microbiology Recent Results (from the past 240 hour(s))  Blood Culture (routine x 2)     Status: Abnormal   Collection Time: 05/29/23  3:28 PM   Specimen: BLOOD RIGHT ARM  Result Value Ref Range Status   Specimen Description   Final    BLOOD RIGHT ARM Performed at [facility], [address], [place], [place] [address]    Special Requests   Final    BOTTLES DRAWN AEROBIC AND ANAEROBIC Blood Culture adequate volume Performed at [facility], [address], [place], [place] [address]    Culture  Setup Time   Final    GRAM POSITIVE COCCI ANAEROBIC BOTTLE ONLY CRITICAL RESULT CALLED TO, READ BACK BY AND VERIFIED WITH: PHARMD JUSTIN LEGGE ON 05/30/23 @ 1746 BY DRT    Culture (A)  Final    STAPHYLOCOCCUS EPIDERMIDIS THE SIGNIFICANCE OF ISOLATING THIS ORGANISM FROM A SINGLE SET OF BLOOD CULTURES WHEN MULTIPLE SETS ARE DRAWN IS UNCERTAIN. PLEASE NOTIFY THE MICROBIOLOGY DEPARTMENT WITHIN ONE WEEK IF SPECIATION AND SENSITIVITIES ARE REQUIRED. Performed at [facility], [address], [place], [place] [address]    Report Status 06/01/2023 FINAL  Final  Blood Culture ID Panel (Reflexed)     Status: Abnormal   Collection Time: 05/29/23  3:28 PM  Result Value Ref Range Status   Enterococcus faecalis NOT DETECTED NOT DETECTED Final   Enterococcus Faecium NOT DETECTED NOT DETECTED Final    Listeria monocytogenes NOT DETECTED NOT DETECTED Final   Staphylococcus species DETECTED (A) NOT DETECTED Final    Comment: CRITICAL RESULT CALLED TO, READ BACK BY AND VERIFIED WITH: PHARMD JUSTIN LEGGE ON 05/30/23 @ 1746 BY DRT    Staphylococcus aureus (BCID) NOT DETECTED NOT DETECTED Final   Staphylococcus epidermidis DETECTED (A) NOT DETECTED Final    Comment: Methicillin (oxacillin) resistant coagulase negative staphylococcus. Possible blood culture contaminant (unless isolated from more than one blood culture draw or clinical case suggests pathogenicity). No antibiotic treatment is indicated for blood  culture contaminants. CRITICAL RESULT CALLED TO, READ BACK BY AND VERIFIED WITH: PHARMD JUSTIN LEGGE ON 05/30/23 @ 1746 BY DRT    Staphylococcus lugdunensis NOT DETECTED NOT DETECTED Final   Streptococcus species NOT DETECTED NOT DETECTED Final   Streptococcus agalactiae NOT DETECTED NOT DETECTED Final   Streptococcus pneumoniae NOT DETECTED NOT DETECTED Final   Streptococcus pyogenes NOT DETECTED NOT DETECTED Final   A.calcoaceticus-baumannii NOT DETECTED NOT DETECTED Final   Bacteroides fragilis NOT DETECTED NOT DETECTED Final   Enterobacterales NOT DETECTED NOT DETECTED Final   Enterobacter cloacae complex NOT DETECTED NOT DETECTED Final   Escherichia coli NOT DETECTED NOT DETECTED Final   Klebsiella aerogenes NOT DETECTED NOT DETECTED Final   Klebsiella oxytoca NOT DETECTED NOT DETECTED Final   Klebsiella pneumoniae NOT DETECTED NOT DETECTED Final   Proteus species NOT DETECTED NOT DETECTED Final   Salmonella species NOT DETECTED NOT DETECTED Final   Serratia marcescens NOT DETECTED NOT DETECTED Final   Haemophilus influenzae NOT DETECTED NOT DETECTED Final   Neisseria meningitidis NOT DETECTED NOT DETECTED Final   Pseudomonas aeruginosa NOT DETECTED NOT DETECTED Final   Stenotrophomonas maltophilia NOT DETECTED NOT DETECTED Final   Candida albicans NOT DETECTED NOT DETECTED  Final   Candida auris NOT DETECTED NOT DETECTED Final   Candida glabrata NOT DETECTED NOT DETECTED Final   Candida krusei NOT DETECTED NOT DETECTED Final   Candida parapsilosis NOT DETECTED NOT DETECTED Final   Candida tropicalis NOT DETECTED NOT DETECTED Final   Cryptococcus neoformans/gattii NOT DETECTED NOT DETECTED Final   Methicillin resistance mecA/C DETECTED (A) NOT DETECTED Final    Comment: CRITICAL RESULT CALLED TO, READ BACK BY AND VERIFIED WITH: PHARMD JUSTIN LEGGE ON 05/30/23 @ 1746 BY DRT Performed at [facility], [address] [place], [place] [address]   Resp panel by RT-PCR (RSV, Flu A&B, Covid) Anterior Nasal Swab     Status: None   Collection Time: 05/29/23  3:29 PM   Specimen: Anterior Nasal Swab  Result Value Ref Range Status   SARS Coronavirus 2 by RT PCR NEGATIVE NEGATIVE Final    Comment: (NOTE) SARS-CoV-2 target nucleic acids are NOT DETECTED.  The SARS-CoV-2 RNA is generally detectable in upper respiratory specimens during the acute phase of infection. The lowest concentration of SARS-CoV-2 viral copies this assay can detect is 138 copies/mL. A negative result does not preclude SARS-Cov-2 infection and should not be used as the sole basis for treatment or other patient management decisions. A negative result may occur with  improper specimen collection/handling, submission of specimen other than nasopharyngeal swab, presence of viral mutation(s) within the areas targeted by this assay, and  inadequate number of viral copies(<138 copies/mL). A negative result must be combined with clinical observations, patient history, and epidemiological information. The expected result is Negative.  Fact Sheet for Patients:  BloggerCourse.com  Fact Sheet for Healthcare Providers:  SeriousBroker.it  This test is no t yet approved or cleared by the [place] FDA and  has been authorized for detection and/or  diagnosis of SARS-CoV-2 by FDA under an Emergency Use Authorization (EUA). This EUA will remain  in effect (meaning this test can be used) for the duration of the COVID-19 declaration under Section 564(b)(1) of the Act, 21 U.S.C.section 360bbb-3(b)(1), unless the authorization is terminated  or revoked sooner.       Influenza A by PCR NEGATIVE NEGATIVE Final   Influenza B by PCR NEGATIVE NEGATIVE Final    Comment: (NOTE) The Xpert Xpress SARS-CoV-2/FLU/RSV plus assay is intended as an aid in the diagnosis of influenza from Nasopharyngeal swab specimens and should not be used as a sole basis for treatment. Nasal washings and aspirates are unacceptable for Xpert Xpress SARS-CoV-2/FLU/RSV testing.  Fact Sheet for Patients: BloggerCourse.com  Fact Sheet for Healthcare Providers: SeriousBroker.it  This test is not yet approved or cleared by the [place] FDA and has been authorized for detection and/or diagnosis of SARS-CoV-2 by FDA under an Emergency Use Authorization (EUA). This EUA will remain in effect (meaning this test can be used) for the duration of the COVID-19 declaration under Section 564(b)(1) of the Act, 21 U.S.C. section 360bbb-3(b)(1), unless the authorization is terminated or revoked.     Resp Syncytial Virus by PCR NEGATIVE NEGATIVE Final    Comment: (NOTE) Fact Sheet for Patients: BloggerCourse.com  Fact Sheet for Healthcare Providers: SeriousBroker.it  This test is not yet approved or cleared by the [place] FDA and has been authorized for detection and/or diagnosis of SARS-CoV-2 by FDA under an Emergency Use Authorization (EUA). This EUA will remain in effect (meaning this test can be used) for the duration of the COVID-19 declaration under Section 564(b)(1) of the Act, 21 U.S.C. section 360bbb-3(b)(1), unless the authorization is terminated  or revoked.  Performed at [facility], [address] [place], [place] [address]   Blood Culture (routine x 2)     Status: None   Collection Time: 05/29/23  3:43 PM   Specimen: BLOOD  Result Value Ref Range Status   Specimen Description   Final    BLOOD BLOOD LEFT HAND Performed at [facility], [address] [place], [place] [address]    Special Requests   Final    BOTTLES DRAWN AEROBIC AND ANAEROBIC Blood Culture adequate volume Performed at [facility], [address], [place], [place] [address]    Culture   Final    NO GROWTH 5 DAYS Performed at [facility], [address], [place], [place] [address]    Report Status 06/03/2023 FINAL  Final  Urine Culture     Status: Abnormal   Collection Time: 05/29/23  6:43 PM   Specimen: Urine, Catheterized  Result Value Ref Range Status   Specimen Description   Final    URINE, CATHETERIZED Performed at [facility], [address] [place], [place] [address]    Special Requests   Final    NONE Performed at [facility], [address], [place], [place] [address]    Culture (A)  Final    >=100,000 COLONIES/mL STAPHYLOCOCCUS SIMULANS 20,000 COLONIES/mL ENTEROCOCCUS FAECALIS  Report Status 06/02/2023 FINAL  Final   Organism ID, Bacteria STAPHYLOCOCCUS SIMULANS (A)  Final   Organism ID, Bacteria ENTEROCOCCUS FAECALIS (A)  Final      Susceptibility   Enterococcus faecalis - MIC*    AMPICILLIN <=2 SENSITIVE Sensitive     NITROFURANTOIN <=16 SENSITIVE Sensitive     VANCOMYCIN 1 SENSITIVE Sensitive     * 20,000 COLONIES/mL ENTEROCOCCUS FAECALIS   Staphylococcus simulans - MIC*    CIPROFLOXACIN >=8 RESISTANT Resistant     GENTAMICIN <=0.5 SENSITIVE Sensitive     NITROFURANTOIN <=16 SENSITIVE Sensitive     OXACILLIN >=4 RESISTANT Resistant     TETRACYCLINE <=1 SENSITIVE Sensitive     VANCOMYCIN <=0.5 SENSITIVE Sensitive      TRIMETH/SULFA <=10 SENSITIVE Sensitive     CLINDAMYCIN RESISTANT Resistant     RIFAMPIN <=0.5 SENSITIVE Sensitive     Inducible Clindamycin POSITIVE Resistant     * >=100,000 COLONIES/mL STAPHYLOCOCCUS SIMULANS    FURTHER DISCHARGE INSTRUCTIONS:   Get Medicines reviewed and adjusted: Please take all your medications with you for your next visit with your Primary MD   Laboratory/radiological data: Please request your Primary MD to go over all hospital tests and procedure/radiological results at the follow up, please ask your Primary MD to get all Hospital records sent to his/her office.   In some cases, they will be blood work, cultures and biopsy results pending at the time of your discharge. Please request that your primary care M.D. goes through all the records of your hospital data and follows up on these results.   Also Note the following: If you experience worsening of your admission symptoms, develop shortness of breath, life threatening emergency, suicidal or homicidal thoughts you must seek medical attention immediately by calling 911 or calling your MD immediately  if symptoms less severe.   You must read complete instructions/literature along with all the possible adverse reactions/side effects for all the Medicines you take and that have been prescribed to you. Take any new Medicines after you have completely understood and accpet all the possible adverse reactions/side effects.    Do not drive when taking Pain medications or sleeping medications (Benzodaizepines)   Do not take more than prescribed Pain, Sleep and Anxiety Medications. It is not advisable to combine anxiety,sleep and pain medications without talking with your primary care practitioner   Special Instructions: If you have smoked or chewed Tobacco  in the last 2 yrs please stop smoking, stop any regular Alcohol  and or any Recreational drug use.   Wear Seat belts while driving.   Please note: You were cared for  by a hospitalist during your hospital stay. Once you are discharged, your primary care physician will handle any further medical issues. Please note that NO REFILLS for any discharge medications will be authorized once you are discharged, as it is imperative that you return to your primary care physician (or establish a relationship with a primary care physician if you do not have one) for your post hospital discharge needs so that they can reassess your need for medications and monitor your lab values  Time coordinating discharge: Over 30 minutes  SIGNED:   Hughie Closs, MD  [facility] 06/03/2023, 10:11 AM *Please note that this is a verbal dictation therefore any spelling or grammatical errors are due to the "Dragon Medical One" system interpretation. If 7PM-7AM, please contact night-coverage [email or web address]

## 2023-06-03 NOTE — Progress Notes (Signed)
Report called to RN at [facility]. All of her questions were addressed to her satisfaction. PTAR at bedside now to transport patient.

## 2023-06-03 NOTE — Progress Notes (Signed)
Physical Therapy Treatment Patient Details Name: Andrea Larson MRN: [id number] DOB: [birth year] Today's Date: 06/03/2023   History of Present Illness Andrea Larson is a 77 yr old female admitted to the hospital 05-29-23 with altered mental status. 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 PMH: HTN, GAD, dementia, CKD 3, COPD, vitamin B12 deficiencey, HLD, chronic hypoxic respiratory failure, recent R femur fracture s/p hemiarthroplasty (anterior approach) on 05-09-23.    PT Comments  Pt with gradual progression.  She was able to take a few steps with mod A, cues for technique and relaxation.  Only required mod A but assist of 2 helpful for pt's confidence - very anxious/fear of falling.  Continue POC. Patient will benefit from continued inpatient follow up therapy, <3 hours/day at d/c.     If plan is discharge home, recommend the following: A lot of help with walking and/or transfers;A lot of help with bathing/dressing/bathroom;Assistance with cooking/housework;Help with stairs or ramp for entrance;Assist for transportation   Can travel by private vehicle     No  Equipment Recommendations  None recommended by PT    Recommendations for Other Services       Precautions / Restrictions Precautions Precautions: Fall     Mobility  Bed Mobility Overal bed mobility: Needs Assistance Bed Mobility: Supine to Sit     Supine to sit: Min assist     General bed mobility comments: verbal cues for technique, increased time, cues for relaxation, min A to scoot forward    Transfers Overall transfer level: Needs assistance Equipment used: Rolling walker (2 wheels) Transfers: Sit to/from Stand Sit to Stand: Mod assist           General transfer comment: Increased time, cues for relaxation, breathing, and hand placement; mod A to rise     Ambulation/Gait Ambulation/Gait assistance: Mod assist Gait Distance (Feet): 4 Feet Assistive device: Rolling walker (2 wheels) Gait Pattern/deviations: Shuffle, Step-to pattern Gait velocity: decreased     General Gait Details: Assist for RW and balance; cues for relaxation anb breathing   Stairs             Wheelchair Mobility     Tilt Bed    Modified Rankin (Stroke Patients Only)       Balance Overall balance assessment: History of Falls, Needs assistance Sitting-balance support: Feet supported, No upper extremity supported Sitting balance-Leahy Scale: Good Sitting balance - Comments: Could balance at EOB but needs close supervision; sat edge of chair to brush teeth   Standing balance support: Bilateral upper extremity supported Standing balance-Leahy Scale: Poor Standing balance comment: RW and min/mod A                            Cognition Arousal: Alert Behavior During Therapy: Anxious Overall Cognitive Status: Impaired/Different from baseline Area of Impairment: Problem solving                 Orientation Level: Disoriented to, Situation       Safety/Judgement: Decreased awareness of safety, Decreased awareness of deficits Awareness: Emergent Problem Solving: Slow processing, Difficulty sequencing General Comments: Highly anxious requiring frequent cues for relaxation        Exercises General Exercises - Lower Extremity Ankle Circles/Pumps: AROM, Both, 10 reps, Supine Long Arc Quad: AROM, Both, 10 reps, Seated Heel Slides: AROM, Left, Right, 10 reps, Supine  General Comments General comments (skin integrity, edema, etc.): Pt on 2 L O2 with sats 95% throughout      Pertinent Vitals/Pain Pain Assessment Pain Assessment: No/denies pain    Home Living                          Prior Function            PT Goals (current goals can now be found in the care plan section) Progress towards PT goals:  Progressing toward goals    Frequency    Min 1X/week      PT Plan      Co-evaluation              AM-PAC PT "6 Clicks" Mobility   Outcome Measure  Help needed turning from your back to your side while in a flat bed without using bedrails?: A Lot Help needed moving from lying on your back to sitting on the side of a flat bed without using bedrails?: A Lot Help needed moving to and from a bed to a chair (including a wheelchair)?: A Lot Help needed standing up from a chair using your arms (e.g., wheelchair or bedside chair)?: A Lot Help needed to walk in hospital room?: Total Help needed climbing 3-5 steps with a railing? : Total 6 Click Score: 10    End of Session Equipment Utilized During Treatment: Gait belt Activity Tolerance: Other (comment) (limited by anxiousness) Patient left: with call bell/phone within reach;with chair alarm set;in chair Nurse Communication: Mobility status PT Visit Diagnosis: Unsteadiness on feet (R26.81);Muscle weakness (generalized) (M62.81);Difficulty in walking, not elsewhere classified (R26.2);History of falling (Z91.81)     Time: 1610-9604 PT Time Calculation (min) (ACUTE ONLY): 21 min  Charges:    $Therapeutic Activity: 8-22 mins PT General Charges $$ ACUTE PT VISIT: 1 Visit                     Anise Salvo, PT Acute Rehab Services [facility] [phone]    Rayetta Humphrey 06/03/2023, 11:59 AM

## 2023-06-03 NOTE — TOC Transition Note (Signed)
Transition of Care [facility]) - CM/SW Discharge Note   Patient Details  Name: Andrea Larson MRN: [id number] Date of Birth: 30-May-1946  [facility] of [facility]) CM/SW Contact:  Otelia Santee, LCSW Phone Number: 06/03/2023, 1:01 PM   Clinical Narrative:    Pt's insurance auth approved. Pt able to transfer to [facility] for SNF. Pt will be going to room 402. RN to call report to [phone]. PTAR called at 12:57pm. DC packet placed at RN station.    Final next level of care: Skilled Nursing Facility Barriers to Discharge: Barriers Resolved   Patient Goals and CMS Choice CMS Medicare.gov Compare Post Acute Care list provided to:: Patient Represenative (must comment) Choice offered to / list presented to : [facility] POA / Guardian  Discharge Placement     Existing PASRR number confirmed : 06/01/23          Patient chooses bed at: [facility] Patient to be transferred to facility by: PTAR Name of family member notified: Son, Chrissie Noa Patient and family notified of of transfer: 06/03/23  Discharge Plan and Services Additional resources added to the After Visit Summary for   In-house Referral: Clinical Social Work Discharge Planning Services: CM Consult Post Acute Care Choice: Skilled Nursing Facility, Resumption of Svcs/PTA Provider          DME Arranged: N/A DME Agency: NA                  Social Determinants of Health (SDOH) Interventions SDOH Screenings   Food Insecurity: No Food Insecurity (06/01/2023)  Housing: Low Risk  (06/01/2023)  Transportation Needs: No Transportation Needs (06/01/2023)  Utilities: Not At Risk (06/01/2023)  Depression (PHQ2-9): Low Risk  (02/11/2023)  Tobacco Use: Medium Risk (05/29/2023)     Readmission Risk Interventions    06/01/2023    4:43 PM 05/31/2023   12:42 PM 05/10/2023    2:54 PM  Readmission Risk Prevention Plan  Transportation Screening Complete Complete Complete  PCP or Specialist Appt within 5-7 Days Complete Complete  Complete  Home Care Screening Complete Complete Complete  Medication Review (RN CM) Complete Complete Complete

## 2023-06-03 NOTE — Consult Note (Signed)
[facility] Liaison Consult Note    06/03/2023  Andrea Larson 12/20/1945 [id number]  Primary Care Provider:  Caesar Bookman, NP, with [facility]  Insurance: [facility]  Patient was reviewed for less than 30 days readmission with rising risk score with a 5 day length of stay   Patient was screened for hospitalization and on behalf of [facility]  Care Coordination to assess for post hospital community care needs.  Patient is being considered for a skilled nursing facility level of care for post hospital transition.  Plan:  Patient needs will be met at the facility with care coordination needs if  returns to community.  For questions or referrals, please contact:   Charlesetta Shanks, RN, BSN, CCM [facility], Population Health, [facility] Liaison Direct Dial: [phone] or secure chat Email: [email or web address]

## 2023-06-05 ENCOUNTER — Other Ambulatory Visit: Payer: Self-pay

## 2023-06-05 ENCOUNTER — Emergency Department (HOSPITAL_COMMUNITY)
Admission: EM | Admit: 2023-06-05 | Discharge: 2023-06-06 | Disposition: A | Discharge status: Home / Self Care | Payer: Medicare HMO | Attending: Emergency Medicine | Admitting: Emergency Medicine

## 2023-06-05 ENCOUNTER — Emergency Department (HOSPITAL_COMMUNITY): Payer: Medicare HMO

## 2023-06-05 ENCOUNTER — Encounter (HOSPITAL_COMMUNITY): Payer: Self-pay

## 2023-06-05 DIAGNOSIS — J449 Chronic obstructive pulmonary disease, unspecified: Secondary | ICD-10-CM | POA: Insufficient documentation

## 2023-06-05 DIAGNOSIS — R509 Fever, unspecified: Secondary | ICD-10-CM | POA: Diagnosis not present

## 2023-06-05 DIAGNOSIS — R4182 Altered mental status, unspecified: Secondary | ICD-10-CM | POA: Diagnosis not present

## 2023-06-05 DIAGNOSIS — W19XXXA Unspecified fall, initial encounter: Secondary | ICD-10-CM | POA: Diagnosis not present

## 2023-06-05 DIAGNOSIS — M545 Low back pain, unspecified: Secondary | ICD-10-CM | POA: Insufficient documentation

## 2023-06-05 DIAGNOSIS — M129 Arthropathy, unspecified: Secondary | ICD-10-CM | POA: Diagnosis not present

## 2023-06-05 DIAGNOSIS — E785 Hyperlipidemia, unspecified: Secondary | ICD-10-CM | POA: Diagnosis not present

## 2023-06-05 DIAGNOSIS — Z7951 Long term (current) use of inhaled steroids: Secondary | ICD-10-CM | POA: Insufficient documentation

## 2023-06-05 DIAGNOSIS — I700 Atherosclerosis of aorta: Secondary | ICD-10-CM | POA: Diagnosis not present

## 2023-06-05 DIAGNOSIS — Z7982 Long term (current) use of aspirin: Secondary | ICD-10-CM | POA: Diagnosis not present

## 2023-06-05 DIAGNOSIS — Z471 Aftercare following joint replacement surgery: Secondary | ICD-10-CM | POA: Diagnosis not present

## 2023-06-05 DIAGNOSIS — M47816 Spondylosis without myelopathy or radiculopathy, lumbar region: Secondary | ICD-10-CM | POA: Diagnosis not present

## 2023-06-05 DIAGNOSIS — I10 Essential (primary) hypertension: Secondary | ICD-10-CM | POA: Diagnosis not present

## 2023-06-05 DIAGNOSIS — R0682 Tachypnea, not elsewhere classified: Secondary | ICD-10-CM | POA: Diagnosis not present

## 2023-06-05 DIAGNOSIS — Z96642 Presence of left artificial hip joint: Secondary | ICD-10-CM | POA: Diagnosis not present

## 2023-06-05 DIAGNOSIS — M25551 Pain in right hip: Secondary | ICD-10-CM | POA: Insufficient documentation

## 2023-06-05 DIAGNOSIS — R350 Frequency of micturition: Secondary | ICD-10-CM | POA: Insufficient documentation

## 2023-06-05 DIAGNOSIS — R0602 Shortness of breath: Secondary | ICD-10-CM | POA: Diagnosis not present

## 2023-06-05 DIAGNOSIS — Z79899 Other long term (current) drug therapy: Secondary | ICD-10-CM | POA: Diagnosis not present

## 2023-06-05 DIAGNOSIS — R410 Disorientation, unspecified: Secondary | ICD-10-CM | POA: Insufficient documentation

## 2023-06-05 DIAGNOSIS — I6523 Occlusion and stenosis of bilateral carotid arteries: Secondary | ICD-10-CM | POA: Diagnosis not present

## 2023-06-05 DIAGNOSIS — F0390 Unspecified dementia without behavioral disturbance: Secondary | ICD-10-CM | POA: Diagnosis not present

## 2023-06-05 DIAGNOSIS — S3210XA Unspecified fracture of sacrum, initial encounter for closed fracture: Secondary | ICD-10-CM | POA: Diagnosis not present

## 2023-06-05 DIAGNOSIS — R9082 White matter disease, unspecified: Secondary | ICD-10-CM | POA: Diagnosis not present

## 2023-06-05 DIAGNOSIS — M419 Scoliosis, unspecified: Secondary | ICD-10-CM | POA: Diagnosis not present

## 2023-06-05 DIAGNOSIS — R000 Tachycardia, unspecified: Secondary | ICD-10-CM | POA: Diagnosis not present

## 2023-06-05 DIAGNOSIS — I499 Cardiac arrhythmia, unspecified: Secondary | ICD-10-CM | POA: Diagnosis not present

## 2023-06-05 DIAGNOSIS — S32501D Unspecified fracture of right pubis, subsequent encounter for fracture with routine healing: Secondary | ICD-10-CM | POA: Diagnosis not present

## 2023-06-05 DIAGNOSIS — N12 Tubulo-interstitial nephritis, not specified as acute or chronic: Secondary | ICD-10-CM | POA: Diagnosis not present

## 2023-06-05 LAB — CBC WITH DIFFERENTIAL/PLATELET
Abs Immature Granulocytes: 0.05 10*3/uL (ref 0.00–0.07)
Basophils Absolute: 0.1 10*3/uL (ref 0.0–0.1)
Basophils Relative: 1 %
Eosinophils Absolute: 0.1 10*3/uL (ref 0.0–0.5)
Eosinophils Relative: 1 %
HCT: 30.3 % — ABNORMAL LOW (ref 36.0–46.0)
Hemoglobin: 9.3 g/dL — ABNORMAL LOW (ref 12.0–15.0)
Immature Granulocytes: 1 %
Lymphocytes Relative: 10 %
Lymphs Abs: 0.9 10*3/uL (ref 0.7–4.0)
MCH: 29.4 pg (ref 26.0–34.0)
MCHC: 30.7 g/dL (ref 30.0–36.0)
MCV: 95.9 fL (ref 80.0–100.0)
Monocytes Absolute: 1 10*3/uL (ref 0.1–1.0)
Monocytes Relative: 11 %
Neutro Abs: 7 10*3/uL (ref 1.7–7.7)
Neutrophils Relative %: 76 %
Platelets: 449 10*3/uL — ABNORMAL HIGH (ref 150–400)
RBC: 3.16 MIL/uL — ABNORMAL LOW (ref 3.87–5.11)
RDW: 14.6 % (ref 11.5–15.5)
WBC: 9.1 10*3/uL (ref 4.0–10.5)
nRBC: 0 % (ref 0.0–0.2)

## 2023-06-05 LAB — URINALYSIS, W/ REFLEX TO CULTURE (INFECTION SUSPECTED)
Bacteria, UA: NONE SEEN
Bilirubin Urine: NEGATIVE
Glucose, UA: NEGATIVE mg/dL
Hgb urine dipstick: NEGATIVE
Ketones, ur: 5 mg/dL — AB
Leukocytes,Ua: NEGATIVE
Nitrite: NEGATIVE
Protein, ur: 30 mg/dL — AB
Specific Gravity, Urine: 1.018 (ref 1.005–1.030)
pH: 6 (ref 5.0–8.0)

## 2023-06-05 LAB — CBG MONITORING, ED: Glucose-Capillary: 93 mg/dL (ref 70–99)

## 2023-06-05 LAB — HEPATIC FUNCTION PANEL
ALT: 16 U/L (ref 0–44)
AST: 23 U/L (ref 15–41)
Albumin: 2.7 g/dL — ABNORMAL LOW (ref 3.5–5.0)
Alkaline Phosphatase: 148 U/L — ABNORMAL HIGH (ref 38–126)
Bilirubin, Direct: <0.1 mg/dL (ref 0.0–0.2)
Total Bilirubin: 0.4 mg/dL (ref <1.2)
Total Protein: 6.1 g/dL — ABNORMAL LOW (ref 6.5–8.1)

## 2023-06-05 LAB — RAPID URINE DRUG SCREEN, HOSP PERFORMED
Amphetamines: NOT DETECTED
Barbiturates: NOT DETECTED
Benzodiazepines: NOT DETECTED
Cocaine: NOT DETECTED
Opiates: POSITIVE — AB
Tetrahydrocannabinol: NOT DETECTED

## 2023-06-05 LAB — BASIC METABOLIC PANEL
Anion gap: 10 (ref 5–15)
BUN: 10 mg/dL (ref 8–23)
CO2: 25 mmol/L (ref 22–32)
Calcium: 8 mg/dL — ABNORMAL LOW (ref 8.9–10.3)
Chloride: 101 mmol/L (ref 98–111)
Creatinine, Ser: 0.94 mg/dL (ref 0.44–1.00)
GFR, Estimated: >60 mL/min (ref >60)
Glucose, Bld: 66 mg/dL — ABNORMAL LOW (ref 70–99)
Potassium: 4.5 mmol/L (ref 3.5–5.1)
Sodium: 136 mmol/L (ref 135–145)

## 2023-06-05 LAB — ETHANOL: Alcohol, Ethyl (B): <10 mg/dL (ref <10)

## 2023-06-05 LAB — AMMONIA: Ammonia: 10 umol/L (ref 9–35)

## 2023-06-05 LAB — I-STAT CG4 LACTIC ACID, ED: Lactic Acid, Venous: 1.1 mmol/L (ref 0.5–1.9)

## 2023-06-05 MED ORDER — OXYCODONE HCL 5 MG PO TABS
5.0000 mg | ORAL_TABLET | Freq: Once | ORAL | Status: AC
  Administered 2023-06-05: 5 mg via ORAL
  Filled 2023-06-05: qty 1

## 2023-06-05 MED ORDER — SODIUM CHLORIDE 0.9 % IV BOLUS
1000.0000 mL | Freq: Once | INTRAVENOUS | Status: AC
  Administered 2023-06-05: 1000 mL via INTRAVENOUS

## 2023-06-05 NOTE — ED Notes (Signed)
This RN called [facility] x3. No answer at each call.

## 2023-06-05 NOTE — ED Notes (Signed)
Pt complains of constipation, PA made aware.

## 2023-06-05 NOTE — Discharge Instructions (Addendum)
You have been seen today for your complaint of confusion and disorientation. Your lab work was overall very reassuring. Your imaging was reassuring and showed no acute abnormalities. Follow up with: Your primary care provider in 1 week for reevaluation Please seek immediate medical care if you develop any of the following symptoms: Feel that you are not able to care for yourself. Develop severe headaches, repeated vomiting, seizures, blackouts, or slurred speech. Have increasing confusion, weakness, numbness, restlessness, or personality changes. Develop a loss of balance, have marked dizziness, feel uncoordinated, or fall. Develop severe anxiety, or you have delusions or hallucinations. At this time there does not appear to be the presence of an emergent medical condition, however there is always the potential for conditions to change. Please read and follow the below instructions.  Do not take your medicine if  develop an itchy rash, swelling in your mouth or lips, or difficulty breathing; call 911 and seek immediate emergency medical attention if this occurs.  You may review your lab tests and imaging results in their entirety on your MyChart account.  Please discuss all results of fully with your primary care provider and other specialist at your follow-up visit.  Note: Portions of this text may have been transcribed using voice recognition software. Every effort was made to ensure accuracy; however, inadvertent computerized transcription errors may still be present.

## 2023-06-05 NOTE — ED Notes (Signed)
Family at bedside. 

## 2023-06-05 NOTE — ED Notes (Signed)
PTAR called for transport back to [facility].

## 2023-06-05 NOTE — ED Provider Notes (Signed)
[place] EMERGENCY DEPARTMENT AT [facility] Provider Note   CSN: [id number] Arrival date & time: 06/05/23  1723     History  Chief Complaint  Patient presents with   Back Pain    BIB EMS from SNF for lower back pain and new worsing onset A fib. Pt had back sx last week. Pain meds not working at facility. New onset A fib past 2 days facility is aware and treating pt. Pts HR 130s pta. Pt was given bolus pta and HR 100s for EMS upon arrival. Wears 2L [place] PRN at bedtime     Andrea Larson is a 77 y.o. female.  With a extensive history including but not limited to COPD, hypertension, hyperlipidemia, arthritis, anxiety, depression, dementia presenting to the ED for evaluation of low back pain.  She reports pain to the low back and the right hip.  She states she fell a few days ago, does not know exactly when she fell.  She does not know how she fell or how she landed.  She does not know if she hit her head or lost consciousness.  Pain is minimal at rest, but worse with any type of movement.  She denies any chest pain.  She reports more shortness of breath than her baseline recently.  She reports some subjective fevers, no chills.  No nausea or vomiting.  No abdominal pain.  She is urinating more frequently than normal recently.   Back Pain      Home Medications Prior to Admission medications   Medication Sig Start Date End Date Taking? Authorizing Provider  acetaminophen (TYLENOL) 325 MG tablet Take 650 mg by mouth every 6 (six) hours as needed for mild pain or headache.    [provider]  aspirin (ASPIRIN CHILDRENS) 81 MG chewable tablet Chew 1 tablet (81 mg total) by mouth 2 (two) times daily with a meal. 05/10/23 06/24/23  Hill, Alain Honey, PA-C  buPROPion (WELLBUTRIN XL) 150 MG 24 hr tablet TAKE ONE TABLET BY MOUTH DAILY Patient taking differently: Take 150 mg by mouth in the morning. 10/29/22   Ngetich, Dinah C, NP  clonazePAM (KLONOPIN) 0.5 MG tablet Take 1  tablet (0.5 mg total) by mouth 2 (two) times daily as needed for up to 7 days for anxiety. Patient taking differently: Take 0.5 mg by mouth once. 05/14/23 06/05/23  Dorcas Carrow, MD  docusate sodium (COLACE) 100 MG capsule Take 1 capsule (100 mg total) by mouth 2 (two) times daily. 05/14/23   Dorcas Carrow, MD  donepezil (ARICEPT) 5 MG tablet TAKE ONE TABLET BY MOUTH AT BEDTIME Patient taking differently: Take 5 mg by mouth at bedtime. 10/29/22   Ngetich, Dinah C, NP  FLUoxetine (PROZAC) 40 MG capsule TAKE ONE CAPSULE BY MOUTH DAILY Patient taking differently: Take 40 mg by mouth in the morning. 07/30/22   Ngetich, Dinah C, NP  fluticasone (FLONASE) 50 MCG/ACT nasal spray USE TWO SPRAYS in each nostril ONCE DAILY. Patient taking differently: Place 2 sprays into both nostrils in the morning. 08/14/21   Corwin Levins, MD  folic acid (FOLVITE) 1 MG tablet TAKE ONE TABLET BY MOUTH DAILY Patient taking differently: Take 1 mg by mouth in the morning. 07/30/22   Ngetich, Dinah C, NP  HYDROcodone-acetaminophen (NORCO/VICODIN) 5-325 MG tablet Take 1 tablet by mouth every 4 (four) hours as needed for moderate pain (pain score 4-6). 06/02/23   Hughie Closs, MD  ipratropium (ATROVENT) 0.02 % nebulizer solution NEBULIZE CONTENTS OF 1  VIAL 3 TIMES A DAY Patient taking differently: Take 0.5 mg by nebulization 3 (three) times daily. 04/16/22   Ngetich, Dinah C, NP  levalbuterol (XOPENEX) 0.63 MG/3ML nebulizer solution USE ONE vial via nebulization every SIX hours as needed FOR SHORTNESS OF BREATH OR wheezing. Patient taking differently: Take 0.63 mg by nebulization every 6 (six) hours as needed for shortness of breath or wheezing. 06/23/21   Ngetich, Dinah C, NP  melatonin 3 MG TABS tablet Take 3 mg by mouth at bedtime.    [provider]  memantine (NAMENDA) 10 MG tablet TAKE ONE TABLET BY MOUTH TWICE DAILY Patient taking differently: Take 10 mg by mouth in the morning and at bedtime. 07/30/22   Ngetich,  Dinah C, NP  mirtazapine (REMERON) 15 MG tablet TAKE ONE TABLET BY MOUTH AT BEDTIME 07/30/22   Ngetich, Dinah C, NP  montelukast (SINGULAIR) 10 MG tablet TAKE ONE TABLET BY MOUTH DAILY Patient taking differently: Take 10 mg by mouth in the morning. 10/29/22   Ngetich, Dinah C, NP  Multiple Vitamin (MULTIVITAMIN WITH MINERALS) TABS tablet Take 1 tablet by mouth daily. 04/12/21   Rodolph Bong, MD  Nutritional Supplements (ENSURE ACTIVE) LIQD Take 1 Can by mouth daily.    [provider]  omeprazole (PRILOSEC OTC) 20 MG tablet Take 40 mg by mouth daily.    [provider]  omeprazole (PRILOSEC) 20 MG capsule Take 2 capsules (40 mg total) by mouth daily as needed. Patient not taking: Reported on 05/29/2023 10/31/22   Ngetich, Dinah C, NP  polyethylene glycol (MIRALAX / GLYCOLAX) 17 g packet Take 17 g by mouth daily as needed for mild constipation. 05/14/23   Dorcas Carrow, MD  Respiratory Therapy Supplies (FLUTTER) DEVI Use as directed 08/19/17   Chilton Greathouse, MD  rosuvastatin (CRESTOR) 20 MG tablet TAKE ONE TABLET BY MOUTH DAILY Patient taking differently: Take 20 mg by mouth at bedtime. 10/29/22   Ngetich, Dinah C, NP  sulfamethoxazole-trimethoprim (BACTRIM DS) 800-160 MG tablet Take 1 tablet by mouth every 12 (twelve) hours for 4 days. 06/03/23 06/07/23  Hughie Closs, MD  thiamine 100 MG tablet Take 1 tablet (100 mg total) by mouth daily. 04/17/21   Ngetich, Dinah C, NP  TRELEGY ELLIPTA 200-62.5-25 MCG/ACT AEPB INHALE 1 PUFF INTO THE LUNGS ONCE DAILY AS DIRECTED. Patient taking differently: Inhale 1 puff into the lungs in the morning. 08/30/22   Ngetich, Dinah C, NP  vitamin B-12 (CYANOCOBALAMIN) 1000 MCG tablet Take 1 tablet (1,000 mcg total) by mouth daily. 04/11/21   Rodolph Bong, MD      Allergies    Lipitor [atorvastatin]    Review of Systems   Review of Systems  Reason unable to perform ROS: dementia.  Musculoskeletal:  Positive for back pain.    Physical  Exam Updated Vital Signs BP 101/81   Pulse (!) 109   Temp 98 F (36.7 C) (Oral)   Resp (!) 23   Wt 54 kg   SpO2 94%   BMI 21.77 kg/m  Physical Exam Vitals and nursing note reviewed.  Constitutional:      General: She is not in acute distress.    Appearance: She is well-developed.     Comments: Resting comfortably in bed  HENT:     Head: Normocephalic and atraumatic.  Eyes:     Conjunctiva/sclera: Conjunctivae normal.  Cardiovascular:     Rate and Rhythm: Normal rate and regular rhythm.     Heart sounds: No murmur heard. Pulmonary:  Effort: No respiratory distress.     Breath sounds: Normal breath sounds.     Comments: Increased effort Abdominal:     Palpations: Abdomen is soft.     Tenderness: There is no abdominal tenderness. There is no guarding.  Musculoskeletal:        General: No swelling.     Cervical back: Neck supple.     Comments: Mild TTP to the midline L-spine, right hip  Skin:    General: Skin is warm and dry.     Capillary Refill: Capillary refill takes less than 2 seconds.  Neurological:     Mental Status: She is alert.  Psychiatric:        Mood and Affect: Mood normal.     ED Results / Procedures / Treatments   Labs (all labs ordered are listed, but only abnormal results are displayed) Labs Reviewed  BASIC METABOLIC PANEL - Abnormal; Notable for the following components:      Result Value   Glucose, Bld 66 (*)    Calcium 8.0 (*)    All other components within normal limits  CBC WITH DIFFERENTIAL/PLATELET - Abnormal; Notable for the following components:   RBC 3.16 (*)    Hemoglobin 9.3 (*)    HCT 30.3 (*)    Platelets 449 (*)    All other components within normal limits  URINALYSIS, W/ REFLEX TO CULTURE (INFECTION SUSPECTED) - Abnormal; Notable for the following components:   Ketones, ur 5 (*)    Protein, ur 30 (*)    All other components within normal limits  HEPATIC FUNCTION PANEL - Abnormal; Notable for the following components:    Total Protein 6.1 (*)    Albumin 2.7 (*)    Alkaline Phosphatase 148 (*)    All other components within normal limits  RAPID URINE DRUG SCREEN, HOSP PERFORMED - Abnormal; Notable for the following components:   Opiates POSITIVE (*)    All other components within normal limits  AMMONIA  ETHANOL  CBG MONITORING, ED  I-STAT CG4 LACTIC ACID, ED    EKG None  Radiology CT Head Wo Contrast  Result Date: 06/05/2023 CLINICAL DATA:  Mental status change, unknown cause. EXAM: CT HEAD WITHOUT CONTRAST TECHNIQUE: Contiguous axial images were obtained from the base of the skull through the vertex without intravenous contrast. RADIATION DOSE REDUCTION: This exam was performed according to the departmental dose-optimization program which includes automated exposure control, adjustment of the mA and/or kV according to patient size and/or use of iterative reconstruction technique. COMPARISON:  CT head without contrast 05/29/2023 FINDINGS: Brain: No acute infarct, hemorrhage, or mass lesion is present. Mild atrophy and white matter disease is stable. The ventricles are of normal size. No significant extraaxial fluid collection is present. The brainstem and cerebellum are within normal limits. Midline structures are within normal limits. Vascular: Atherosclerotic calcifications are present within the cavernous internal carotid arteries bilaterally and at the dural margin of the left vertebral artery. No hyperdense vessel is present. Skull: Calvarium is intact. No focal lytic or blastic lesions are present. No significant extracranial soft tissue lesion is present. Sinuses/Orbits: A fluid level is present in the left maxillary sinus. Chronic left maxillary wall thickening is present. The paranasal sinuses and mastoid air cells are otherwise clear. Bilateral lens replacements are noted. Globes and orbits are otherwise unremarkable. IMPRESSION: 1. No acute intracranial abnormality or significant interval change. 2.  Stable mild atrophy and white matter disease. This likely reflects the sequela of chronic microvascular ischemia. 3.  Fluid level in the left maxillary sinus with chronic left maxillary wall thickening. This likely reflects acute on chronic sinusitis. Electronically Signed   By: Marin Roberts M.D.   On: 06/05/2023 20:17   DG HIP UNILAT WITH PELVIS 2-3 VIEWS RIGHT  Result Date: 06/05/2023 CLINICAL DATA:  Fall EXAM: DG HIP (WITH OR WITHOUT PELVIS) 2-3V RIGHT COMPARISON:  CT pelvis 05/29/2023 FINDINGS: Again observed are healing fractures of the right pubic rami and of the sacrum. Right hip hemiarthroplasty observed. No periprosthetic fracture identified. IMPRESSION: 1. Early healing of fractures of the right pubic rami and of the sacrum. 2. Right hip hemiarthroplasty. Electronically Signed   By: Gaylyn Rong M.D.   On: 06/05/2023 19:42   DG HIP UNILAT WITH PELVIS 2-3 VIEWS LEFT  Result Date: 06/05/2023 CLINICAL DATA:  Fall EXAM: DG HIP (WITH OR WITHOUT PELVIS) 2-3V LEFT COMPARISON:  None Available. FINDINGS: No left hip fracture is identified. Indistinct healing fractures of the right pubic rami and sacrum. Contralateral hip hemiarthroplasty. IMPRESSION: 1. No left hip fracture is identified. 2. Healing fractures of the right pubic rami and sacrum. Electronically Signed   By: Gaylyn Rong M.D.   On: 06/05/2023 19:41   DG Chest 1 View  Result Date: 06/05/2023 CLINICAL DATA:  Shortness of breath EXAM: CHEST  1 VIEW COMPARISON:  05/29/2023 FINDINGS: The patient is rotated to the right on today's radiograph, reducing diagnostic sensitivity and specificity. Atherosclerotic calcification of the aortic arch. Degenerative glenohumeral arthropathy, right greater than left. Multiple midthoracic compression fractures as shown on chest CT from 05/29/2023. The lungs appear clear. IMPRESSION: 1. No acute findings. 2. Multiple midthoracic compression fractures as shown on chest CT from 05/29/2023. On  that CT examination, the right and left mainstem bronchi appear somewhat narrow/flattened, raising the possibility of bronchomalacia. 3. Degenerative glenohumeral arthropathy, right greater than left. Electronically Signed   By: Gaylyn Rong M.D.   On: 06/05/2023 19:40   DG Lumbar Spine Complete  Result Date: 06/05/2023 CLINICAL DATA:  Fall, hip pain after arthroplasty EXAM: LUMBAR SPINE - COMPLETE 4+ VIEW COMPARISON:  CT abdomen 05/29/2023 FINDINGS: Levoconvex lumbar scoliosis and bony demineralization. Mild superior endplate compression fracture at L5 is chronic. There is chronic grade 1 degenerative retrolisthesis at T12-L1, L1-2, and L2-3. Substantial multilevel degenerative facet arthropathy. Stable loss of disc height at the T12-L1 level. Atherosclerosis is present, including aortoiliac atherosclerotic disease. Right hip hemiarthroplasty. There is abnormal lucency in the right superior pubic ramus probably representing hyperemic healing response to the known pubic ramus fracture. The patient is also known to have a healing fracture the right inferior pubic ramus along with sacral fractures. IMPRESSION: 1. No acute lumbar spine findings. The patient has known healing fractures in the sacrum and right pubic rami. 2. Levoconvex lumbar scoliosis and bony demineralization. 3. Chronic mild superior endplate compression fracture at L5. 4. Substantial multilevel degenerative facet arthropathy. 5. Aortic Atherosclerosis (ICD10-I70.0). Electronically Signed   By: Gaylyn Rong M.D.   On: 06/05/2023 19:36    Procedures Procedures    Medications Ordered in ED Medications  sodium chloride 0.9 % bolus 1,000 mL (0 mLs Intravenous Stopped 06/05/23 2137)    ED Course/ Medical Decision Making/ A&P Clinical Course as of 06/05/23 2204  Wed Jun 05, 2023  1810 Attempted to contact nursing home, [facility], on numerous occasions without success [AS]  1839 Spoke with son, Chrissie Noa, regarding patient  after confirming relation.  He states that he visited her at noon today and she was  hallucinating and speaking to people who were not present.  States that the nursing home has informed him that her symptoms have continuously gotten worse.  He states that her blood pressure (heart rate?) was abnormal.  He specifically denies being informed of any new falls.  She has been bedridden for a few days now. [AS]  2109 Stable AMS, she is disoriented. Not aphasic.  No UTI, no leukocytosis.   [CC]    Clinical Course User Index [AS] Kelis Plasse, Edsel Petrin, PA-C [CC] Glyn Ade, MD                                 Medical Decision Making Amount and/or Complexity of Data Reviewed Labs: ordered. Radiology: ordered.  This patient presents to the ED for concern of back pain, shortness of breath, this involves an extensive number of treatment options, and is a complaint that carries with it a high risk of complications and morbidity.   The emergent differential diagnosis for back pain includes but is not limited to fracture, muscle strain, cauda equina, spinal stenosis. DDD, ankylosing spondylitis, acute ligamentous injury, disk herniation, spondylolisthesis, Epidural compression syndrome, metastatic cancer, transverse myelitis, vertebral osteomyelitis, diskitis, kidney stone, pyelonephritis, AAA, Perforated ulcer, Retrocecal appendicitis, pancreatitis, bowel obstruction, retroperitoneal hemorrhage or mass, meningitis.   The emergent differential diagnosis for shortness of breath includes, but is not limited to, Pulmonary edema, bronchoconstriction, Pneumonia, Pulmonary embolism, Pneumotherax/ Hemothorax, Dysrythmia, ACS.    My initial workup includes labs, imaging, EKG.  Patient declines pain medication  Additional history obtained from: Nursing notes from this visit. Previous records within EMR system ED to hospital admission on 05/07/2023 for closed fracture of right pubic ramus, he did a hospital  admission on 05/29/2023 for acute metabolic encephalopathy Admission on 1120, 2024.  Patient was found to have pyelonephritis of left kidney at that time.  Was discharged on 4 additional days of Bactrim. Family son provides the majority of the history EMS provides a portion of the history Echocardiogram in 05/30/2023 with an EF of 60 to 65%  I ordered, reviewed and interpreted labs which include: CBC, CMP, urinalysis, lactate, ammonia, ethanol.  Stable anemia with hemoglobin 9.3, no leukocytosis, mild hypoglycemia of 66 LFT within normal limits, urinalysis without evidence of UTI, normal lactic acid  I ordered imaging studies including x-ray chest, bilateral hips, lumbar spine, CT head I independently visualized and interpreted imaging which showed no acute findings I agree with the radiologist interpretation  Cardiac Monitoring:  The patient was maintained on a cardiac monitor.  I personally viewed and interpreted the cardiac monitored which showed an underlying rhythm of: Initially sinus tachycardia, improved to NSR after fluids  Afebrile, initially tachycardic but otherwise hemodynamically stable.  77 year old female presenting to the emergency department for evaluation of delirium.  She has been acting abnormal over the last couple of days.  Was recently admitted multiple times for metabolic encephalopathy and hip fracture.  Was diagnosed with pyelonephritis.  Is currently on antibiotics for this.  On exam, she is alert but disoriented.  Lab workup was overall unremarkable.  Mild hypoglycemia.  No evidence of UTI.  No significant electrolyte derangement.  Imaging is negative for acute abnormalities of the bilateral hips, lumbar spine, CT of the head.  Overall suspect delirium secondary to recent hospitalizations, urinary tract infection, hip replacement.  Do not believe patient would benefit from admission to hospital at this time, rather believe she would more benefit  from transfer back to her  skilled nursing facility with continuous monitoring.  I discussed this finding with the patient's son.  Patient's mentation appeared to improve during her stay in the emergency department after treatment with IV fluids as well.  Stable at discharge.  At this time there does not appear to be any evidence of an acute emergency medical condition and the patient appears stable for discharge with appropriate outpatient follow up. Diagnosis was discussed with patient who verbalizes understanding of care plan and is agreeable to discharge. I have discussed return precautions with patient and family who verbalizes understanding. Patient encouraged to follow-up with their PCP within 1 week. All questions answered.  Patient's case discussed with Dr. Doran Durand who agrees with plan to discharge with follow-up.   Note: Portions of this report may have been transcribed using voice recognition software. Every effort was made to ensure accuracy; however, inadvertent computerized transcription errors may still be present.        Final Clinical Impression(s) / ED Diagnoses Final diagnoses:  Confusion and disorientation    Rx / DC Orders ED Discharge Orders     None         Mora Bellman 06/05/23 2204    Glyn Ade, MD 06/05/23 2256

## 2023-06-17 DIAGNOSIS — F0390 Unspecified dementia without behavioral disturbance: Secondary | ICD-10-CM | POA: Diagnosis not present

## 2023-06-17 DIAGNOSIS — F331 Major depressive disorder, recurrent, moderate: Secondary | ICD-10-CM | POA: Diagnosis not present

## 2023-06-17 DIAGNOSIS — G4700 Insomnia, unspecified: Secondary | ICD-10-CM | POA: Diagnosis not present

## 2023-06-17 DIAGNOSIS — F411 Generalized anxiety disorder: Secondary | ICD-10-CM | POA: Diagnosis not present

## 2023-06-18 DIAGNOSIS — S72031D Displaced midcervical fracture of right femur, subsequent encounter for closed fracture with routine healing: Secondary | ICD-10-CM | POA: Diagnosis not present

## 2023-06-19 DIAGNOSIS — E559 Vitamin D deficiency, unspecified: Secondary | ICD-10-CM | POA: Diagnosis not present

## 2023-06-19 DIAGNOSIS — J449 Chronic obstructive pulmonary disease, unspecified: Secondary | ICD-10-CM | POA: Diagnosis not present

## 2023-06-19 DIAGNOSIS — I10 Essential (primary) hypertension: Secondary | ICD-10-CM | POA: Diagnosis not present

## 2023-06-19 DIAGNOSIS — E785 Hyperlipidemia, unspecified: Secondary | ICD-10-CM | POA: Diagnosis not present

## 2023-07-26 DIAGNOSIS — R2681 Unsteadiness on feet: Secondary | ICD-10-CM | POA: Diagnosis not present

## 2023-07-26 DIAGNOSIS — M62552 Muscle wasting and atrophy, not elsewhere classified, left thigh: Secondary | ICD-10-CM | POA: Diagnosis not present

## 2023-07-26 DIAGNOSIS — M62551 Muscle wasting and atrophy, not elsewhere classified, right thigh: Secondary | ICD-10-CM | POA: Diagnosis not present

## 2023-07-26 DIAGNOSIS — R2689 Other abnormalities of gait and mobility: Secondary | ICD-10-CM | POA: Diagnosis not present

## 2023-07-26 DIAGNOSIS — S32511S Fracture of superior rim of right pubis, sequela: Secondary | ICD-10-CM | POA: Diagnosis not present

## 2023-07-26 DIAGNOSIS — M6259 Muscle wasting and atrophy, not elsewhere classified, multiple sites: Secondary | ICD-10-CM | POA: Diagnosis not present

## 2023-07-26 DIAGNOSIS — R488 Other symbolic dysfunctions: Secondary | ICD-10-CM | POA: Diagnosis not present

## 2023-07-26 DIAGNOSIS — Z9181 History of falling: Secondary | ICD-10-CM | POA: Diagnosis not present

## 2023-08-01 DIAGNOSIS — M6259 Muscle wasting and atrophy, not elsewhere classified, multiple sites: Secondary | ICD-10-CM | POA: Diagnosis not present

## 2023-08-01 DIAGNOSIS — R2681 Unsteadiness on feet: Secondary | ICD-10-CM | POA: Diagnosis not present

## 2023-08-01 DIAGNOSIS — R488 Other symbolic dysfunctions: Secondary | ICD-10-CM | POA: Diagnosis not present

## 2023-08-01 DIAGNOSIS — S32511S Fracture of superior rim of right pubis, sequela: Secondary | ICD-10-CM | POA: Diagnosis not present

## 2023-08-01 DIAGNOSIS — M62552 Muscle wasting and atrophy, not elsewhere classified, left thigh: Secondary | ICD-10-CM | POA: Diagnosis not present

## 2023-08-01 DIAGNOSIS — M62551 Muscle wasting and atrophy, not elsewhere classified, right thigh: Secondary | ICD-10-CM | POA: Diagnosis not present

## 2023-08-01 DIAGNOSIS — R2689 Other abnormalities of gait and mobility: Secondary | ICD-10-CM | POA: Diagnosis not present

## 2023-08-01 DIAGNOSIS — Z9181 History of falling: Secondary | ICD-10-CM | POA: Diagnosis not present

## 2023-08-02 DIAGNOSIS — R2689 Other abnormalities of gait and mobility: Secondary | ICD-10-CM | POA: Diagnosis not present

## 2023-08-02 DIAGNOSIS — S32511S Fracture of superior rim of right pubis, sequela: Secondary | ICD-10-CM | POA: Diagnosis not present

## 2023-08-02 DIAGNOSIS — M62551 Muscle wasting and atrophy, not elsewhere classified, right thigh: Secondary | ICD-10-CM | POA: Diagnosis not present

## 2023-08-02 DIAGNOSIS — Z9181 History of falling: Secondary | ICD-10-CM | POA: Diagnosis not present

## 2023-08-02 DIAGNOSIS — M6259 Muscle wasting and atrophy, not elsewhere classified, multiple sites: Secondary | ICD-10-CM | POA: Diagnosis not present

## 2023-08-02 DIAGNOSIS — M62552 Muscle wasting and atrophy, not elsewhere classified, left thigh: Secondary | ICD-10-CM | POA: Diagnosis not present

## 2023-08-02 DIAGNOSIS — R488 Other symbolic dysfunctions: Secondary | ICD-10-CM | POA: Diagnosis not present

## 2023-08-02 DIAGNOSIS — R2681 Unsteadiness on feet: Secondary | ICD-10-CM | POA: Diagnosis not present

## 2023-08-05 DIAGNOSIS — M62552 Muscle wasting and atrophy, not elsewhere classified, left thigh: Secondary | ICD-10-CM | POA: Diagnosis not present

## 2023-08-05 DIAGNOSIS — Z9181 History of falling: Secondary | ICD-10-CM | POA: Diagnosis not present

## 2023-08-05 DIAGNOSIS — R2689 Other abnormalities of gait and mobility: Secondary | ICD-10-CM | POA: Diagnosis not present

## 2023-08-05 DIAGNOSIS — S32511S Fracture of superior rim of right pubis, sequela: Secondary | ICD-10-CM | POA: Diagnosis not present

## 2023-08-05 DIAGNOSIS — M62551 Muscle wasting and atrophy, not elsewhere classified, right thigh: Secondary | ICD-10-CM | POA: Diagnosis not present

## 2023-08-06 DIAGNOSIS — M6259 Muscle wasting and atrophy, not elsewhere classified, multiple sites: Secondary | ICD-10-CM | POA: Diagnosis not present

## 2023-08-06 DIAGNOSIS — R2681 Unsteadiness on feet: Secondary | ICD-10-CM | POA: Diagnosis not present

## 2023-08-06 DIAGNOSIS — R488 Other symbolic dysfunctions: Secondary | ICD-10-CM | POA: Diagnosis not present

## 2023-08-08 DIAGNOSIS — R2689 Other abnormalities of gait and mobility: Secondary | ICD-10-CM | POA: Diagnosis not present

## 2023-08-08 DIAGNOSIS — S32511S Fracture of superior rim of right pubis, sequela: Secondary | ICD-10-CM | POA: Diagnosis not present

## 2023-08-08 DIAGNOSIS — M62552 Muscle wasting and atrophy, not elsewhere classified, left thigh: Secondary | ICD-10-CM | POA: Diagnosis not present

## 2023-08-08 DIAGNOSIS — M62551 Muscle wasting and atrophy, not elsewhere classified, right thigh: Secondary | ICD-10-CM | POA: Diagnosis not present

## 2023-08-08 DIAGNOSIS — Z9181 History of falling: Secondary | ICD-10-CM | POA: Diagnosis not present

## 2023-08-09 DIAGNOSIS — R2681 Unsteadiness on feet: Secondary | ICD-10-CM | POA: Diagnosis not present

## 2023-08-09 DIAGNOSIS — M6259 Muscle wasting and atrophy, not elsewhere classified, multiple sites: Secondary | ICD-10-CM | POA: Diagnosis not present

## 2023-08-09 DIAGNOSIS — R488 Other symbolic dysfunctions: Secondary | ICD-10-CM | POA: Diagnosis not present

## 2023-08-12 DIAGNOSIS — M62552 Muscle wasting and atrophy, not elsewhere classified, left thigh: Secondary | ICD-10-CM | POA: Diagnosis not present

## 2023-08-12 DIAGNOSIS — M62551 Muscle wasting and atrophy, not elsewhere classified, right thigh: Secondary | ICD-10-CM | POA: Diagnosis not present

## 2023-08-12 DIAGNOSIS — Z9181 History of falling: Secondary | ICD-10-CM | POA: Diagnosis not present

## 2023-08-12 DIAGNOSIS — R2689 Other abnormalities of gait and mobility: Secondary | ICD-10-CM | POA: Diagnosis not present

## 2023-08-12 DIAGNOSIS — S32511S Fracture of superior rim of right pubis, sequela: Secondary | ICD-10-CM | POA: Diagnosis not present

## 2023-08-13 DIAGNOSIS — R2681 Unsteadiness on feet: Secondary | ICD-10-CM | POA: Diagnosis not present

## 2023-08-13 DIAGNOSIS — R488 Other symbolic dysfunctions: Secondary | ICD-10-CM | POA: Diagnosis not present

## 2023-08-13 DIAGNOSIS — M6259 Muscle wasting and atrophy, not elsewhere classified, multiple sites: Secondary | ICD-10-CM | POA: Diagnosis not present

## 2023-08-14 DIAGNOSIS — Z9181 History of falling: Secondary | ICD-10-CM | POA: Diagnosis not present

## 2023-08-14 DIAGNOSIS — S32511S Fracture of superior rim of right pubis, sequela: Secondary | ICD-10-CM | POA: Diagnosis not present

## 2023-08-14 DIAGNOSIS — M62551 Muscle wasting and atrophy, not elsewhere classified, right thigh: Secondary | ICD-10-CM | POA: Diagnosis not present

## 2023-08-14 DIAGNOSIS — M62552 Muscle wasting and atrophy, not elsewhere classified, left thigh: Secondary | ICD-10-CM | POA: Diagnosis not present

## 2023-08-14 DIAGNOSIS — R2689 Other abnormalities of gait and mobility: Secondary | ICD-10-CM | POA: Diagnosis not present

## 2023-08-15 DIAGNOSIS — R488 Other symbolic dysfunctions: Secondary | ICD-10-CM | POA: Diagnosis not present

## 2023-08-15 DIAGNOSIS — M6259 Muscle wasting and atrophy, not elsewhere classified, multiple sites: Secondary | ICD-10-CM | POA: Diagnosis not present

## 2023-08-15 DIAGNOSIS — R2681 Unsteadiness on feet: Secondary | ICD-10-CM | POA: Diagnosis not present

## 2023-08-16 DIAGNOSIS — R488 Other symbolic dysfunctions: Secondary | ICD-10-CM | POA: Diagnosis not present

## 2023-08-16 DIAGNOSIS — R2681 Unsteadiness on feet: Secondary | ICD-10-CM | POA: Diagnosis not present

## 2023-08-16 DIAGNOSIS — M6259 Muscle wasting and atrophy, not elsewhere classified, multiple sites: Secondary | ICD-10-CM | POA: Diagnosis not present

## 2023-08-19 ENCOUNTER — Telehealth: Payer: Self-pay | Admitting: Family

## 2023-08-19 DIAGNOSIS — Z9181 History of falling: Secondary | ICD-10-CM | POA: Diagnosis not present

## 2023-08-19 DIAGNOSIS — M62551 Muscle wasting and atrophy, not elsewhere classified, right thigh: Secondary | ICD-10-CM | POA: Diagnosis not present

## 2023-08-19 DIAGNOSIS — R2689 Other abnormalities of gait and mobility: Secondary | ICD-10-CM | POA: Diagnosis not present

## 2023-08-19 DIAGNOSIS — M62552 Muscle wasting and atrophy, not elsewhere classified, left thigh: Secondary | ICD-10-CM | POA: Diagnosis not present

## 2023-08-19 DIAGNOSIS — S32511S Fracture of superior rim of right pubis, sequela: Secondary | ICD-10-CM | POA: Diagnosis not present

## 2023-08-19 NOTE — Telephone Encounter (Signed)
 Called [facility] [phone] and LMOM to return call.

## 2023-08-19 NOTE — Telephone Encounter (Signed)
 RX Authorization form received from [employer] requesting refills on clonazepam  0.5 mg tablet one by mouth twice daily.records indicates taking once daily.Please call patient/facility to verify medication frequency.

## 2023-08-20 NOTE — Telephone Encounter (Signed)
Incoming fax received from [employer], requesting a refill on clonazepam to be sent to [phone] and to [facility]   Clonazepam take 1 by mouth twice daily scheduled (current med list states patient to take x 7 days)   I called Chrissie Noa (patients son) and left message requesting a call. Reason for call was to see if he had any insight on this request

## 2023-08-21 DIAGNOSIS — R2689 Other abnormalities of gait and mobility: Secondary | ICD-10-CM | POA: Diagnosis not present

## 2023-08-21 DIAGNOSIS — Z9181 History of falling: Secondary | ICD-10-CM | POA: Diagnosis not present

## 2023-08-21 DIAGNOSIS — M62552 Muscle wasting and atrophy, not elsewhere classified, left thigh: Secondary | ICD-10-CM | POA: Diagnosis not present

## 2023-08-21 DIAGNOSIS — S32511S Fracture of superior rim of right pubis, sequela: Secondary | ICD-10-CM | POA: Diagnosis not present

## 2023-08-21 DIAGNOSIS — M62551 Muscle wasting and atrophy, not elsewhere classified, right thigh: Secondary | ICD-10-CM | POA: Diagnosis not present

## 2023-08-22 DIAGNOSIS — R488 Other symbolic dysfunctions: Secondary | ICD-10-CM | POA: Diagnosis not present

## 2023-08-22 DIAGNOSIS — R2681 Unsteadiness on feet: Secondary | ICD-10-CM | POA: Diagnosis not present

## 2023-08-22 DIAGNOSIS — M6259 Muscle wasting and atrophy, not elsewhere classified, multiple sites: Secondary | ICD-10-CM | POA: Diagnosis not present

## 2023-08-23 DIAGNOSIS — R488 Other symbolic dysfunctions: Secondary | ICD-10-CM | POA: Diagnosis not present

## 2023-08-23 DIAGNOSIS — M6259 Muscle wasting and atrophy, not elsewhere classified, multiple sites: Secondary | ICD-10-CM | POA: Diagnosis not present

## 2023-08-23 DIAGNOSIS — R2681 Unsteadiness on feet: Secondary | ICD-10-CM | POA: Diagnosis not present

## 2023-08-26 DIAGNOSIS — M62552 Muscle wasting and atrophy, not elsewhere classified, left thigh: Secondary | ICD-10-CM | POA: Diagnosis not present

## 2023-08-26 DIAGNOSIS — S32511S Fracture of superior rim of right pubis, sequela: Secondary | ICD-10-CM | POA: Diagnosis not present

## 2023-08-26 DIAGNOSIS — Z9181 History of falling: Secondary | ICD-10-CM | POA: Diagnosis not present

## 2023-08-26 DIAGNOSIS — M62551 Muscle wasting and atrophy, not elsewhere classified, right thigh: Secondary | ICD-10-CM | POA: Diagnosis not present

## 2023-08-26 DIAGNOSIS — R2689 Other abnormalities of gait and mobility: Secondary | ICD-10-CM | POA: Diagnosis not present

## 2023-08-29 DIAGNOSIS — R2689 Other abnormalities of gait and mobility: Secondary | ICD-10-CM | POA: Diagnosis not present

## 2023-08-29 DIAGNOSIS — M62552 Muscle wasting and atrophy, not elsewhere classified, left thigh: Secondary | ICD-10-CM | POA: Diagnosis not present

## 2023-08-29 DIAGNOSIS — S32511S Fracture of superior rim of right pubis, sequela: Secondary | ICD-10-CM | POA: Diagnosis not present

## 2023-08-29 DIAGNOSIS — Z9181 History of falling: Secondary | ICD-10-CM | POA: Diagnosis not present

## 2023-08-29 DIAGNOSIS — M62551 Muscle wasting and atrophy, not elsewhere classified, right thigh: Secondary | ICD-10-CM | POA: Diagnosis not present

## 2023-08-30 DIAGNOSIS — R2681 Unsteadiness on feet: Secondary | ICD-10-CM | POA: Diagnosis not present

## 2023-08-30 DIAGNOSIS — R488 Other symbolic dysfunctions: Secondary | ICD-10-CM | POA: Diagnosis not present

## 2023-08-30 DIAGNOSIS — M6259 Muscle wasting and atrophy, not elsewhere classified, multiple sites: Secondary | ICD-10-CM | POA: Diagnosis not present

## 2023-09-02 DIAGNOSIS — M62552 Muscle wasting and atrophy, not elsewhere classified, left thigh: Secondary | ICD-10-CM | POA: Diagnosis not present

## 2023-09-02 DIAGNOSIS — R488 Other symbolic dysfunctions: Secondary | ICD-10-CM | POA: Diagnosis not present

## 2023-09-02 DIAGNOSIS — M6259 Muscle wasting and atrophy, not elsewhere classified, multiple sites: Secondary | ICD-10-CM | POA: Diagnosis not present

## 2023-09-02 DIAGNOSIS — M62551 Muscle wasting and atrophy, not elsewhere classified, right thigh: Secondary | ICD-10-CM | POA: Diagnosis not present

## 2023-09-02 DIAGNOSIS — Z9181 History of falling: Secondary | ICD-10-CM | POA: Diagnosis not present

## 2023-09-02 DIAGNOSIS — R2681 Unsteadiness on feet: Secondary | ICD-10-CM | POA: Diagnosis not present

## 2023-09-02 DIAGNOSIS — S32511S Fracture of superior rim of right pubis, sequela: Secondary | ICD-10-CM | POA: Diagnosis not present

## 2023-09-02 DIAGNOSIS — R2689 Other abnormalities of gait and mobility: Secondary | ICD-10-CM | POA: Diagnosis not present

## 2023-09-03 DIAGNOSIS — R2681 Unsteadiness on feet: Secondary | ICD-10-CM | POA: Diagnosis not present

## 2023-09-03 DIAGNOSIS — R488 Other symbolic dysfunctions: Secondary | ICD-10-CM | POA: Diagnosis not present

## 2023-09-03 DIAGNOSIS — M6259 Muscle wasting and atrophy, not elsewhere classified, multiple sites: Secondary | ICD-10-CM | POA: Diagnosis not present

## 2023-09-05 ENCOUNTER — Telehealth: Payer: Self-pay | Admitting: Emergency Medicine

## 2023-09-05 ENCOUNTER — Other Ambulatory Visit: Payer: Self-pay | Admitting: Emergency Medicine

## 2023-09-05 NOTE — Telephone Encounter (Signed)
 Someone from [facility] called and said patient ran out of medication for nausea and they would like an written order for her PRN for nausea.   Phone [phone] Fax [phone]

## 2023-09-10 DIAGNOSIS — R488 Other symbolic dysfunctions: Secondary | ICD-10-CM | POA: Diagnosis not present

## 2023-09-10 DIAGNOSIS — R2681 Unsteadiness on feet: Secondary | ICD-10-CM | POA: Diagnosis not present

## 2023-09-10 DIAGNOSIS — M6259 Muscle wasting and atrophy, not elsewhere classified, multiple sites: Secondary | ICD-10-CM | POA: Diagnosis not present

## 2023-09-11 DIAGNOSIS — M62551 Muscle wasting and atrophy, not elsewhere classified, right thigh: Secondary | ICD-10-CM | POA: Diagnosis not present

## 2023-09-11 DIAGNOSIS — M62552 Muscle wasting and atrophy, not elsewhere classified, left thigh: Secondary | ICD-10-CM | POA: Diagnosis not present

## 2023-09-11 DIAGNOSIS — S32511S Fracture of superior rim of right pubis, sequela: Secondary | ICD-10-CM | POA: Diagnosis not present

## 2023-09-11 DIAGNOSIS — R2689 Other abnormalities of gait and mobility: Secondary | ICD-10-CM | POA: Diagnosis not present

## 2023-09-11 DIAGNOSIS — Z9181 History of falling: Secondary | ICD-10-CM | POA: Diagnosis not present

## 2023-09-12 DIAGNOSIS — M2012 Hallux valgus (acquired), left foot: Secondary | ICD-10-CM | POA: Diagnosis not present

## 2023-09-12 DIAGNOSIS — R2681 Unsteadiness on feet: Secondary | ICD-10-CM | POA: Diagnosis not present

## 2023-09-12 DIAGNOSIS — M6259 Muscle wasting and atrophy, not elsewhere classified, multiple sites: Secondary | ICD-10-CM | POA: Diagnosis not present

## 2023-09-12 DIAGNOSIS — B351 Tinea unguium: Secondary | ICD-10-CM | POA: Diagnosis not present

## 2023-09-12 DIAGNOSIS — M62551 Muscle wasting and atrophy, not elsewhere classified, right thigh: Secondary | ICD-10-CM | POA: Diagnosis not present

## 2023-09-12 DIAGNOSIS — R488 Other symbolic dysfunctions: Secondary | ICD-10-CM | POA: Diagnosis not present

## 2023-09-12 DIAGNOSIS — M62552 Muscle wasting and atrophy, not elsewhere classified, left thigh: Secondary | ICD-10-CM | POA: Diagnosis not present

## 2023-09-12 DIAGNOSIS — R2689 Other abnormalities of gait and mobility: Secondary | ICD-10-CM | POA: Diagnosis not present

## 2023-09-12 DIAGNOSIS — M79675 Pain in left toe(s): Secondary | ICD-10-CM | POA: Diagnosis not present

## 2023-09-12 DIAGNOSIS — Z9181 History of falling: Secondary | ICD-10-CM | POA: Diagnosis not present

## 2023-09-12 DIAGNOSIS — S32511S Fracture of superior rim of right pubis, sequela: Secondary | ICD-10-CM | POA: Diagnosis not present

## 2023-09-16 DIAGNOSIS — S32511S Fracture of superior rim of right pubis, sequela: Secondary | ICD-10-CM | POA: Diagnosis not present

## 2023-09-16 DIAGNOSIS — M62552 Muscle wasting and atrophy, not elsewhere classified, left thigh: Secondary | ICD-10-CM | POA: Diagnosis not present

## 2023-09-16 DIAGNOSIS — Z9181 History of falling: Secondary | ICD-10-CM | POA: Diagnosis not present

## 2023-09-16 DIAGNOSIS — R2689 Other abnormalities of gait and mobility: Secondary | ICD-10-CM | POA: Diagnosis not present

## 2023-09-16 DIAGNOSIS — M62551 Muscle wasting and atrophy, not elsewhere classified, right thigh: Secondary | ICD-10-CM | POA: Diagnosis not present

## 2023-09-17 DIAGNOSIS — R2681 Unsteadiness on feet: Secondary | ICD-10-CM | POA: Diagnosis not present

## 2023-09-17 DIAGNOSIS — R488 Other symbolic dysfunctions: Secondary | ICD-10-CM | POA: Diagnosis not present

## 2023-09-17 DIAGNOSIS — M6259 Muscle wasting and atrophy, not elsewhere classified, multiple sites: Secondary | ICD-10-CM | POA: Diagnosis not present

## 2023-09-18 DIAGNOSIS — Z9181 History of falling: Secondary | ICD-10-CM | POA: Diagnosis not present

## 2023-09-18 DIAGNOSIS — R2689 Other abnormalities of gait and mobility: Secondary | ICD-10-CM | POA: Diagnosis not present

## 2023-09-18 DIAGNOSIS — S32511S Fracture of superior rim of right pubis, sequela: Secondary | ICD-10-CM | POA: Diagnosis not present

## 2023-09-18 DIAGNOSIS — M62551 Muscle wasting and atrophy, not elsewhere classified, right thigh: Secondary | ICD-10-CM | POA: Diagnosis not present

## 2023-09-18 DIAGNOSIS — M62552 Muscle wasting and atrophy, not elsewhere classified, left thigh: Secondary | ICD-10-CM | POA: Diagnosis not present

## 2023-09-20 DIAGNOSIS — R2681 Unsteadiness on feet: Secondary | ICD-10-CM | POA: Diagnosis not present

## 2023-09-20 DIAGNOSIS — R488 Other symbolic dysfunctions: Secondary | ICD-10-CM | POA: Diagnosis not present

## 2023-09-20 DIAGNOSIS — M6259 Muscle wasting and atrophy, not elsewhere classified, multiple sites: Secondary | ICD-10-CM | POA: Diagnosis not present

## 2023-09-23 DIAGNOSIS — R488 Other symbolic dysfunctions: Secondary | ICD-10-CM | POA: Diagnosis not present

## 2023-09-23 DIAGNOSIS — M6259 Muscle wasting and atrophy, not elsewhere classified, multiple sites: Secondary | ICD-10-CM | POA: Diagnosis not present

## 2023-09-23 DIAGNOSIS — R2681 Unsteadiness on feet: Secondary | ICD-10-CM | POA: Diagnosis not present

## 2023-09-24 DIAGNOSIS — S32511S Fracture of superior rim of right pubis, sequela: Secondary | ICD-10-CM | POA: Diagnosis not present

## 2023-09-24 DIAGNOSIS — R2689 Other abnormalities of gait and mobility: Secondary | ICD-10-CM | POA: Diagnosis not present

## 2023-09-24 DIAGNOSIS — Z9181 History of falling: Secondary | ICD-10-CM | POA: Diagnosis not present

## 2023-09-24 DIAGNOSIS — M62551 Muscle wasting and atrophy, not elsewhere classified, right thigh: Secondary | ICD-10-CM | POA: Diagnosis not present

## 2023-09-24 DIAGNOSIS — M62552 Muscle wasting and atrophy, not elsewhere classified, left thigh: Secondary | ICD-10-CM | POA: Diagnosis not present

## 2023-09-26 DIAGNOSIS — S32511S Fracture of superior rim of right pubis, sequela: Secondary | ICD-10-CM | POA: Diagnosis not present

## 2023-09-26 DIAGNOSIS — Z9181 History of falling: Secondary | ICD-10-CM | POA: Diagnosis not present

## 2023-09-26 DIAGNOSIS — M62551 Muscle wasting and atrophy, not elsewhere classified, right thigh: Secondary | ICD-10-CM | POA: Diagnosis not present

## 2023-09-26 DIAGNOSIS — M62552 Muscle wasting and atrophy, not elsewhere classified, left thigh: Secondary | ICD-10-CM | POA: Diagnosis not present

## 2023-09-26 DIAGNOSIS — R2689 Other abnormalities of gait and mobility: Secondary | ICD-10-CM | POA: Diagnosis not present

## 2023-09-30 ENCOUNTER — Ambulatory Visit (INDEPENDENT_AMBULATORY_CARE_PROVIDER_SITE_OTHER): Admitting: Adult Health

## 2023-09-30 ENCOUNTER — Ambulatory Visit: Payer: Self-pay | Admitting: Family

## 2023-09-30 ENCOUNTER — Encounter: Payer: Self-pay | Admitting: Adult Health

## 2023-09-30 VITALS — BP 122/78 | HR 62 | Resp 18 | Ht 62.0 in | Wt 112.4 lb

## 2023-09-30 DIAGNOSIS — M6259 Muscle wasting and atrophy, not elsewhere classified, multiple sites: Secondary | ICD-10-CM | POA: Diagnosis not present

## 2023-09-30 DIAGNOSIS — R488 Other symbolic dysfunctions: Secondary | ICD-10-CM | POA: Diagnosis not present

## 2023-09-30 DIAGNOSIS — R051 Acute cough: Secondary | ICD-10-CM | POA: Diagnosis not present

## 2023-09-30 DIAGNOSIS — J449 Chronic obstructive pulmonary disease, unspecified: Secondary | ICD-10-CM

## 2023-09-30 DIAGNOSIS — F03918 Unspecified dementia, unspecified severity, with other behavioral disturbance: Secondary | ICD-10-CM | POA: Diagnosis not present

## 2023-09-30 DIAGNOSIS — R2681 Unsteadiness on feet: Secondary | ICD-10-CM | POA: Diagnosis not present

## 2023-09-30 DIAGNOSIS — F418 Other specified anxiety disorders: Secondary | ICD-10-CM

## 2023-09-30 MED ORDER — GUAIFENESIN 100 MG/5ML PO LIQD: 10.0000 mL | Freq: Three times a day (TID) | ORAL | 0 refills | Status: AC

## 2023-09-30 NOTE — Telephone Encounter (Signed)
  Chief Complaint: cough  Symptoms: mucus +  Disposition: [] ED /[] Urgent Care (no appt availability in office) / [x] Appointment(In office/virtual)/ []  [facility]/ [] Home Care/ [] Refused Recommended Disposition /[]  Mobile Bus/ []  Follow-up with PCP Additional Notes: Resident Care Coordinator Chip Boer called on behalf of pt. Pt started with a cough 4 days ago.  Cough has turned productive with yellow mucus. Pt has COPD and wears 2L of oxygen at all times. Chip Boer stated pt is always SOB. Chip Boer denies fever and all other symptoms. Pt has appt today at 1500. Chip Boer needs to check with family for transportation, but said she will call back to cancel if needed.            Copied from CRM [id number]. Topic: Clinical - Red Word Triage >> Sep 30, 2023  2:02 PM Alona Bene A wrote: Red Word that prompted transfer to Nurse Triage: Cough - Consistent 4+days Reason for Disposition  [1] MILD difficulty breathing (e.g., minimal/no SOB at rest, SOB with walking, pulse <100) AND [2] still present when not coughing  Answer Assessment - Initial Assessment Questions 1. ONSET: "When did the cough begin?"      End of last week  2. SEVERITY: "How bad is the cough today?"      Wet cough  3. SPUTUM: "Describe the color of your sputum" (none, dry cough; clear, white, yellow, green)     Yellow  4. HEMOPTYSIS: "Are you coughing up any blood?" If so ask: "How much?" (flecks, streaks, tablespoons, etc.)     Denies  5. DIFFICULTY BREATHING: "Are you having difficulty breathing?" If Yes, ask: "How bad is it?" (e.g., mild, moderate, severe)    - MILD: No SOB at rest, mild SOB with walking, speaks normally in sentences, can lie down, no retractions, pulse < 100.    - MODERATE: SOB at rest, SOB with minimal exertion and prefers to sit, cannot lie down flat, speaks in phrases, mild retractions, audible wheezing, pulse 100-120.    - SEVERE: Very SOB at rest, speaks in single words, struggling to breathe,  sitting hunched forward, retractions, pulse > 120      Moderate  6. FEVER: "Do you have a fever?" If Yes, ask: "What is your temperature, how was it measured, and when did it start?"     Denies  7. CARDIAC HISTORY: "Do you have any history of heart disease?" (e.g., heart attack, congestive heart failure)      Not sure  8. LUNG HISTORY: "Do you have any history of lung disease?"  (e.g., pulmonary embolus, asthma, emphysema)     COPD  10. OTHER SYMPTOMS: "Do you have any other symptoms?" (e.g., runny nose, wheezing, chest pain)       Denies  Protocols used: Cough - Acute Productive-A-AH

## 2023-09-30 NOTE — Progress Notes (Unsigned)
 [facility] clinic  Provider:  Kenard Gower DNP  Code Status:  Limited DNR  Goals of Care:     06/05/2023   10:37 PM  Advanced Directives  Does Patient Have a Medical Advance Directive? No     Chief Complaint  Patient presents with   Acute Visit     cough   Discussed the use of AI scribe software for clinical note transcription with the patient, who gave verbal consent to proceed.  HPI: Patient is a 78 y.o. female with COPD who presents with a cough and thick yellowish mucus for one week.  She has been experiencing a cough with thick yellowish mucus for about a week. There is no fever, but she has a slightly sore throat. Her appetite is described as 'fine,' although she notes she does not eat a lot. Her weight fluctuates slightly, currently at 112.4 pounds. No current body aches beyond her usual experience. She tested negative for COVID-19 and influenza A and B.  She has a history of COPD and is currently using Xopenex nebulization every six hours as needed and Trelegy Ellipta inhalation, one puff in the morning. There is no wheezing noted, but ronchi are present, which is consistent with her COPD.  She has a history of dementia and is taking Aricept 5 mg once a day and memantine 10 mg twice a day.  For depression and appetite stimulation, she takes mirtazapine 15 mg at bedtime, bupropion 150 mg daily, and fluoxetine (Prozac).  She has received the initial COVID-19 vaccine and two boosters but missed the most recent one due to being in rehab. She has had COVID-19 two or three times previously.    Past Medical History:  Diagnosis Date   Allergic rhinitis    Allergic rhinitis, cause unspecified 09/21/2011   Aortic stenosis 08/23/2016   Arthritis    Asthma    Asthma 09/21/2011   Colon polyps    COPD (chronic obstructive pulmonary disease) (HCC)    mild    Coronary artery calcification 08/23/2016   Dementia (HCC)    Depression with anxiety    HTN (hypertension)     Hyperlipidemia    UTI (lower urinary tract infection)     Past Surgical History:  Procedure Laterality Date   DENTAL SURGERY     pilonidial cyst     TONSILLECTOMY     TOTAL HIP ARTHROPLASTY Right 05/09/2023   Procedure: HEMI HIP ARTHROPLASTY ANTERIOR APPROACH;  Surgeon: Samson Frederic, MD;  Location: [facility];  Service: Orthopedics;  Laterality: Right;    Allergies  Allergen Reactions   Lipitor [Atorvastatin] Other (See Comments)    Myalgias    Outpatient Encounter Medications as of 09/30/2023  Medication Sig   acetaminophen (TYLENOL) 325 MG tablet Take 650 mg by mouth every 6 (six) hours as needed for mild pain or headache.   buPROPion (WELLBUTRIN XL) 150 MG 24 hr tablet TAKE ONE TABLET BY MOUTH DAILY (Patient taking differently: Take 150 mg by mouth in the morning.)   docusate sodium (COLACE) 100 MG capsule Take 1 capsule (100 mg total) by mouth 2 (two) times daily.   donepezil (ARICEPT) 5 MG tablet TAKE ONE TABLET BY MOUTH AT BEDTIME (Patient taking differently: Take 5 mg by mouth at bedtime.)   FLUoxetine (PROZAC) 40 MG capsule TAKE ONE CAPSULE BY MOUTH DAILY (Patient taking differently: Take 40 mg by mouth in the morning.)   fluticasone (FLONASE) 50 MCG/ACT nasal spray USE TWO SPRAYS in each nostril ONCE DAILY. (Patient taking  differently: Place 2 sprays into both nostrils in the morning.)   folic acid (FOLVITE) 1 MG tablet TAKE ONE TABLET BY MOUTH DAILY (Patient taking differently: Take 1 mg by mouth in the morning.)   guaiFENesin (ROBITUSSIN) 100 MG/5ML liquid Take 10 mLs by mouth 3 (three) times daily for 14 days.   HYDROcodone-acetaminophen (NORCO/VICODIN) 5-325 MG tablet Take 1 tablet by mouth every 4 (four) hours as needed for moderate pain (pain score 4-6).   ipratropium (ATROVENT) 0.02 % nebulizer solution NEBULIZE CONTENTS OF 1 VIAL 3 TIMES A DAY (Patient taking differently: Take 0.5 mg by nebulization 3 (three) times daily.)   levalbuterol (XOPENEX) 0.63 MG/3ML nebulizer  solution USE ONE vial via nebulization every SIX hours as needed FOR SHORTNESS OF BREATH OR wheezing. (Patient taking differently: Take 0.63 mg by nebulization every 6 (six) hours as needed for shortness of breath or wheezing.)   melatonin 3 MG TABS tablet Take 3 mg by mouth at bedtime.   memantine (NAMENDA) 10 MG tablet TAKE ONE TABLET BY MOUTH TWICE DAILY (Patient taking differently: Take 10 mg by mouth in the morning and at bedtime.)   mirtazapine (REMERON) 15 MG tablet TAKE ONE TABLET BY MOUTH AT BEDTIME   montelukast (SINGULAIR) 10 MG tablet TAKE ONE TABLET BY MOUTH DAILY (Patient taking differently: Take 10 mg by mouth in the morning.)   Multiple Vitamin (MULTIVITAMIN WITH MINERALS) TABS tablet Take 1 tablet by mouth daily.   Nutritional Supplements (ENSURE ACTIVE) LIQD Take 1 Can by mouth daily.   omeprazole (PRILOSEC OTC) 20 MG tablet Take 40 mg by mouth daily.   omeprazole (PRILOSEC) 20 MG capsule Take 2 capsules (40 mg total) by mouth daily as needed.   polyethylene glycol (MIRALAX / GLYCOLAX) 17 g packet Take 17 g by mouth daily as needed for mild constipation.   Respiratory Therapy Supplies (FLUTTER) DEVI Use as directed   rosuvastatin (CRESTOR) 20 MG tablet TAKE ONE TABLET BY MOUTH DAILY (Patient taking differently: Take 20 mg by mouth at bedtime.)   thiamine 100 MG tablet Take 1 tablet (100 mg total) by mouth daily.   TRELEGY ELLIPTA 200-62.5-25 MCG/ACT AEPB INHALE 1 PUFF INTO THE LUNGS ONCE DAILY AS DIRECTED. (Patient taking differently: Inhale 1 puff into the lungs in the morning.)   vitamin B-12 (CYANOCOBALAMIN) 1000 MCG tablet Take 1 tablet (1,000 mcg total) by mouth daily.   clonazePAM (KLONOPIN) 0.5 MG tablet Take 1 tablet (0.5 mg total) by mouth 2 (two) times daily as needed for up to 7 days for anxiety. (Patient taking differently: Take 0.5 mg by mouth once.)   No facility-administered encounter medications on file as of 09/30/2023.    Review of Systems:  Review of Systems   Constitutional:  Negative for appetite change, chills, fatigue and fever.  HENT:  Negative for congestion, hearing loss, rhinorrhea and sore throat.   Eyes: Negative.   Respiratory:  Positive for cough. Negative for shortness of breath and wheezing.   Cardiovascular:  Negative for chest pain, palpitations and leg swelling.  Gastrointestinal:  Negative for abdominal pain, constipation, diarrhea, nausea and vomiting.  Genitourinary:  Negative for dysuria.  Musculoskeletal:  Negative for arthralgias, back pain and myalgias.  Skin:  Negative for color change, rash and wound.  Neurological:  Negative for dizziness, weakness and headaches.  Psychiatric/Behavioral:  Negative for behavioral problems. The patient is not nervous/anxious.     Health Maintenance  Topic Date Due   Medicare Annual Wellness (AWV)  Never done   Zoster  Vaccines- Shingrix (2 of 2) 03/22/2017   Lung Cancer Screening  05/28/2024   DTaP/Tdap/Td (5 - Td or Tdap) 02/27/2031   Pneumonia Vaccine 41+ Years old  Completed   INFLUENZA VACCINE  Completed   DEXA SCAN  Completed   HPV VACCINES  Aged Out   MAMMOGRAM  Discontinued   Colonoscopy  Discontinued   COVID-19 Vaccine  Discontinued   Hepatitis C Screening  Discontinued    Physical Exam: Vitals:   09/30/23 1501  BP: 122/78  Pulse: 62  Resp: 18  SpO2: 97%  Weight: 112 lb 6.4 oz (51 kg)  Height: 5\' 2"  (1.575 m)   Body mass index is 20.56 kg/m. Physical Exam Constitutional:      Appearance: Normal appearance.  HENT:     Head: Normocephalic and atraumatic.     Nose: Nose normal.     Mouth/Throat:     Mouth: Mucous membranes are moist.  Eyes:     Conjunctiva/sclera: Conjunctivae normal.  Cardiovascular:     Rate and Rhythm: Normal rate and regular rhythm.  Pulmonary:     Effort: Pulmonary effort is normal.     Breath sounds: Rhonchi present.     Comments: O2 @ 2L/min via [place] Abdominal:     General: Bowel sounds are normal.     Palpations: Abdomen is  soft.  Musculoskeletal:        General: Normal range of motion.     Cervical back: Normal range of motion.  Skin:    General: Skin is warm and dry.  Neurological:     General: No focal deficit present.     Mental Status: She is alert and oriented to person, place, and time.  Psychiatric:        Mood and Affect: Mood normal.        Behavior: Behavior normal.        Thought Content: Thought content normal.        Judgment: Judgment normal.    Labs reviewed: Basic Metabolic Panel: Recent Labs    06/02/23 0541 06/03/23 0504 06/05/23 1742  NA 133* 135 136  K 4.2 4.9 4.5  CL 100 100 101  CO2 29 31 25   GLUCOSE 83 91 66*  BUN 8 8 10   CREATININE 0.78 0.86 0.94  CALCIUM 8.0* 8.3* 8.0*   Liver Function Tests: Recent Labs    06/02/23 0541 06/03/23 0504 06/05/23 1914  AST 18 24 23   ALT 13 16 16   ALKPHOS 137* 147* 148*  BILITOT 0.3 0.2 0.4  PROT 5.5* 5.8* 6.1*  ALBUMIN 2.4* 2.5* 2.7*   No results for input(s): "LIPASE", "AMYLASE" in the last 8760 hours. Recent Labs    05/30/23 0500 06/05/23 1914  AMMONIA 26 10   CBC: Recent Labs    05/07/23 2058 05/09/23 0820 05/29/23 1528 05/29/23 1535 06/02/23 0541 06/03/23 0504 06/05/23 1742  WBC 14.9*   < > 8.8   < > 6.1 6.7 9.1  NEUTROABS 12.4*  --  7.0  --   --   --  7.0  HGB 13.4   < > 9.7*   < > 8.6* 8.6* 9.3*  HCT 41.9   < > 31.3*   < > 29.3* 28.9* 30.3*  MCV 91.7   < > 96.6   < > 100.3* 98.0 95.9  PLT 274   < > 449*   < > 272 318 449*   < > = values in this interval not displayed.   Lipid Panel: No results  for input(s): "CHOL", "HDL", "LDLCALC", "TRIG", "CHOLHDL", "LDLDIRECT" in the last 8760 hours. Lab Results  Component Value Date   HGBA1C 4.8 05/31/2023    Procedures since last visit: No results found.  Assessment/Plan  1. Acute cough (Primary) -  Cough with yellow mucus likely due to common cold, not bacterial infection. No fever, negative for COVID-19 and influenza. Related to COPD. - Prescribe  guaifenesin every 8 hours for two weeks. - Advise to monitor for fever and re-evaluate if symptoms worsen. - POC Influenza A/B - POC COVID-19 - guaiFENesin (ROBITUSSIN) 100 MG/5ML liquid; Take 10 mLs by mouth 3 (three) times daily for 14 days.  Dispense: 420 mL; Refill: 0  2. Chronic obstructive pulmonary disease, unspecified COPD type (HCC) -  COPD managed with Xopenex as needed and Trelegy Ellipta daily. No wheezing, presence of rhonchi, contributing to cough.  3. Dementia with behavioral disturbance (HCC) -  Dementia managed with Aricept and memantine.  4. Depression with anxiety -  Depression managed with mirtazapine, bupropion, and fluoxetine. Mirtazapine also used to increase appetite.      Labs/tests ordered:   POC Covid-19 and influenza A/B   Return if symptoms worsen or fail to improve.  Kenard Gower, NP

## 2023-09-30 NOTE — Telephone Encounter (Signed)
 Patient will see Monina at 3 pm today

## 2023-10-01 DIAGNOSIS — M62551 Muscle wasting and atrophy, not elsewhere classified, right thigh: Secondary | ICD-10-CM | POA: Diagnosis not present

## 2023-10-01 DIAGNOSIS — Z9181 History of falling: Secondary | ICD-10-CM | POA: Diagnosis not present

## 2023-10-01 DIAGNOSIS — R2689 Other abnormalities of gait and mobility: Secondary | ICD-10-CM | POA: Diagnosis not present

## 2023-10-01 DIAGNOSIS — M62552 Muscle wasting and atrophy, not elsewhere classified, left thigh: Secondary | ICD-10-CM | POA: Diagnosis not present

## 2023-10-01 DIAGNOSIS — S32511S Fracture of superior rim of right pubis, sequela: Secondary | ICD-10-CM | POA: Diagnosis not present

## 2023-10-07 DIAGNOSIS — M6259 Muscle wasting and atrophy, not elsewhere classified, multiple sites: Secondary | ICD-10-CM | POA: Diagnosis not present

## 2023-10-07 DIAGNOSIS — R488 Other symbolic dysfunctions: Secondary | ICD-10-CM | POA: Diagnosis not present

## 2023-10-07 DIAGNOSIS — R2681 Unsteadiness on feet: Secondary | ICD-10-CM | POA: Diagnosis not present

## 2023-10-07 LAB — POCT INFLUENZA A/B
Influenza A, POC: NEGATIVE
Influenza B, POC: NEGATIVE

## 2023-10-07 LAB — POC COVID19 BINAXNOW: SARS Coronavirus 2 Ag: NEGATIVE

## 2023-10-10 DIAGNOSIS — M6259 Muscle wasting and atrophy, not elsewhere classified, multiple sites: Secondary | ICD-10-CM | POA: Diagnosis not present

## 2023-10-10 DIAGNOSIS — R2681 Unsteadiness on feet: Secondary | ICD-10-CM | POA: Diagnosis not present

## 2023-10-10 DIAGNOSIS — R488 Other symbolic dysfunctions: Secondary | ICD-10-CM | POA: Diagnosis not present

## 2023-10-16 DIAGNOSIS — R2681 Unsteadiness on feet: Secondary | ICD-10-CM | POA: Diagnosis not present

## 2023-10-16 DIAGNOSIS — M6259 Muscle wasting and atrophy, not elsewhere classified, multiple sites: Secondary | ICD-10-CM | POA: Diagnosis not present

## 2023-10-16 DIAGNOSIS — R488 Other symbolic dysfunctions: Secondary | ICD-10-CM | POA: Diagnosis not present

## 2023-10-17 DIAGNOSIS — M6259 Muscle wasting and atrophy, not elsewhere classified, multiple sites: Secondary | ICD-10-CM | POA: Diagnosis not present

## 2023-10-17 DIAGNOSIS — R488 Other symbolic dysfunctions: Secondary | ICD-10-CM | POA: Diagnosis not present

## 2023-10-17 DIAGNOSIS — R2681 Unsteadiness on feet: Secondary | ICD-10-CM | POA: Diagnosis not present

## 2023-10-24 DIAGNOSIS — R488 Other symbolic dysfunctions: Secondary | ICD-10-CM | POA: Diagnosis not present

## 2023-10-24 DIAGNOSIS — M6259 Muscle wasting and atrophy, not elsewhere classified, multiple sites: Secondary | ICD-10-CM | POA: Diagnosis not present

## 2023-10-24 DIAGNOSIS — R2681 Unsteadiness on feet: Secondary | ICD-10-CM | POA: Diagnosis not present

## 2023-10-25 DIAGNOSIS — R2681 Unsteadiness on feet: Secondary | ICD-10-CM | POA: Diagnosis not present

## 2023-10-25 DIAGNOSIS — M6259 Muscle wasting and atrophy, not elsewhere classified, multiple sites: Secondary | ICD-10-CM | POA: Diagnosis not present

## 2023-10-25 DIAGNOSIS — R488 Other symbolic dysfunctions: Secondary | ICD-10-CM | POA: Diagnosis not present

## 2023-10-30 ENCOUNTER — Encounter: Payer: Medicare HMO | Admitting: Family

## 2023-10-30 DIAGNOSIS — M6259 Muscle wasting and atrophy, not elsewhere classified, multiple sites: Secondary | ICD-10-CM | POA: Diagnosis not present

## 2023-10-30 DIAGNOSIS — R2681 Unsteadiness on feet: Secondary | ICD-10-CM | POA: Diagnosis not present

## 2023-10-30 DIAGNOSIS — R488 Other symbolic dysfunctions: Secondary | ICD-10-CM | POA: Diagnosis not present

## 2023-10-30 NOTE — Progress Notes (Signed)
   This encounter was created in error - please disregard. No show

## 2023-10-31 DIAGNOSIS — R2681 Unsteadiness on feet: Secondary | ICD-10-CM | POA: Diagnosis not present

## 2023-10-31 DIAGNOSIS — M6259 Muscle wasting and atrophy, not elsewhere classified, multiple sites: Secondary | ICD-10-CM | POA: Diagnosis not present

## 2023-10-31 DIAGNOSIS — R488 Other symbolic dysfunctions: Secondary | ICD-10-CM | POA: Diagnosis not present

## 2023-11-05 DIAGNOSIS — R488 Other symbolic dysfunctions: Secondary | ICD-10-CM | POA: Diagnosis not present

## 2023-11-05 DIAGNOSIS — M6259 Muscle wasting and atrophy, not elsewhere classified, multiple sites: Secondary | ICD-10-CM | POA: Diagnosis not present

## 2023-11-05 DIAGNOSIS — R2681 Unsteadiness on feet: Secondary | ICD-10-CM | POA: Diagnosis not present

## 2023-11-07 DIAGNOSIS — R2681 Unsteadiness on feet: Secondary | ICD-10-CM | POA: Diagnosis not present

## 2023-11-07 DIAGNOSIS — R488 Other symbolic dysfunctions: Secondary | ICD-10-CM | POA: Diagnosis not present

## 2023-11-07 DIAGNOSIS — M6259 Muscle wasting and atrophy, not elsewhere classified, multiple sites: Secondary | ICD-10-CM | POA: Diagnosis not present

## 2023-11-11 DIAGNOSIS — R2681 Unsteadiness on feet: Secondary | ICD-10-CM | POA: Diagnosis not present

## 2023-11-11 DIAGNOSIS — M6259 Muscle wasting and atrophy, not elsewhere classified, multiple sites: Secondary | ICD-10-CM | POA: Diagnosis not present

## 2023-11-11 DIAGNOSIS — R488 Other symbolic dysfunctions: Secondary | ICD-10-CM | POA: Diagnosis not present

## 2023-11-13 DIAGNOSIS — R2681 Unsteadiness on feet: Secondary | ICD-10-CM | POA: Diagnosis not present

## 2023-11-13 DIAGNOSIS — R488 Other symbolic dysfunctions: Secondary | ICD-10-CM | POA: Diagnosis not present

## 2023-11-13 DIAGNOSIS — M6259 Muscle wasting and atrophy, not elsewhere classified, multiple sites: Secondary | ICD-10-CM | POA: Diagnosis not present

## 2023-12-03 ENCOUNTER — Telehealth: Payer: Self-pay | Admitting: Family

## 2023-12-03 NOTE — Telephone Encounter (Signed)
 Noted

## 2023-12-03 NOTE — Telephone Encounter (Signed)
 Paper work received from [place] at [facility] facility,Nurse reports patient was found on the floor around 8: 30 am.Patient stated to the Nurse that she had rolled off the bed and did not hit her head and had no pain.VS Temp 97.3,B/p 148/81,HR 114.POA notified. Will continue to monitor vital signs and Notify provider for any changes.Monitor vital signs and neuro check per facility policy.

## 2023-12-11 ENCOUNTER — Other Ambulatory Visit: Payer: Self-pay | Admitting: Family

## 2023-12-11 DIAGNOSIS — F1027 Alcohol dependence with alcohol-induced persisting dementia: Secondary | ICD-10-CM

## 2023-12-11 NOTE — Telephone Encounter (Unsigned)
 Copied from CRM [id number]. Topic: Clinical - Medication Refill >> Dec 11, 2023  4:04 PM Lenon Radar A wrote: Medication: clonazePAM  (KLONOPIN ) 0.5 MG tablet  Has the patient contacted their pharmacy? Yes (Agent: If no, request that the patient contact the pharmacy for the refill. If patient does not wish to contact the pharmacy document the reason why and proceed with request.) (Agent: If yes, when and what did the pharmacy advise?)  Spoke wit pharmacy. No new refill request was received.   This is the patient's preferred pharmacy:  [facility] - [address], [place] - [id number] [address] [place] [address] Phone: [phone] Fax: [phone]  Is this the correct pharmacy for this prescription? Yes If no, delete pharmacy and type the correct one.   Has the prescription been filled recently? No  Is the patient out of the medication? Yes  Has the patient been seen for an appointment in the last year OR does the patient have an upcoming appointment? Yes  Can we respond through MyChart? Yes  Agent: Please be advised that Rx refills may take up to 3 business days. We ask that you follow-up with your pharmacy.

## 2023-12-12 NOTE — Telephone Encounter (Signed)
 Please schedule appointment to sign contract for clonazepam  prior to refill as soon as possible.

## 2023-12-13 ENCOUNTER — Telehealth: Payer: Self-pay | Admitting: Family

## 2023-12-13 NOTE — Telephone Encounter (Signed)
 Form received from [facility] at [facility] states patient was found sitting on the floor at 8: 30 am by CNA.Patient stated rolled out of bed.she was assisted up by two CNA.No visible injuries noted.continue to monitor vital signs per facility policy and notify provider's office for any acute changes.

## 2023-12-16 ENCOUNTER — Encounter: Admitting: Family

## 2023-12-16 ENCOUNTER — Other Ambulatory Visit: Payer: Self-pay

## 2023-12-16 DIAGNOSIS — F1027 Alcohol dependence with alcohol-induced persisting dementia: Secondary | ICD-10-CM

## 2023-12-16 NOTE — Progress Notes (Signed)
   This encounter was created in error - please disregard. No show

## 2023-12-16 NOTE — Telephone Encounter (Signed)
 This telephone encounter was faxed/electronically routed to [facility]   Refer to chart review- Misc Reports- Chart review:Routing History for documentation

## 2023-12-16 NOTE — Telephone Encounter (Signed)
 Patient is requesting a refill of the following medications: Requested Prescriptions   Pending Prescriptions Disp Refills   clonazePAM  (KLONOPIN ) 0.5 MG tablet  0    Sig: Take 1 tablet (0.5 mg total) by mouth 2 (two) times daily.    Date of last refill: 05/14/2023  Refill amount: 14  Treatment agreement date: was to be signed today, patient was seen

## 2023-12-17 MED ORDER — CLONAZEPAM 0.5 MG PO TABS
0.5000 mg | ORAL_TABLET | Freq: Two times a day (BID) | ORAL | 0 refills | Status: DC
Start: 2023-12-17 — End: 2024-02-03

## 2023-12-17 NOTE — Telephone Encounter (Signed)
 Patient was had a no show for appointment.Please schedule appointment as soon as possible.will not refill medication prior to updating contact on next month.

## 2023-12-25 ENCOUNTER — Other Ambulatory Visit: Payer: Self-pay

## 2023-12-25 DIAGNOSIS — F1027 Alcohol dependence with alcohol-induced persisting dementia: Secondary | ICD-10-CM

## 2023-12-25 NOTE — Telephone Encounter (Signed)
 Patient is requesting a refill of the following medications: Requested Prescriptions   Pending Prescriptions Disp Refills   clonazePAM  (KLONOPIN ) 0.5 MG tablet 60 tablet 5    Sig: Take 1 tablet (0.5 mg total) by mouth 2 (two) times daily.    Date of last refill: 12/17/23  Refill amount: 30  Treatment agreement date: no treatment agreement on file, no pending appointment, and mychart appointment reminder sent to patient

## 2023-12-26 ENCOUNTER — Other Ambulatory Visit: Payer: Self-pay | Admitting: Family

## 2023-12-26 DIAGNOSIS — F1027 Alcohol dependence with alcohol-induced persisting dementia: Secondary | ICD-10-CM

## 2023-12-26 NOTE — Telephone Encounter (Signed)
 Vannie Gentile, RN to [facility] (Selected Message)     12/26/23 11:07 AM FYI: Carolynne Citron called from [facility] stating that they only received 14 day supply refill for this medication and receiving requests from continuing care facility to refill for next time. I advised that patient missed medication refill visit and she stated that she will contact the continuing care facility to let them know.

## 2023-12-26 NOTE — Telephone Encounter (Signed)
Message routed to Jessica Eubanks, NP due to PCP Ngetich, Dinah C, NP being out of office. 

## 2023-12-26 NOTE — Telephone Encounter (Signed)
 Copied from CRM [id number]. Topic: Clinical - Medication Refill >> Dec 26, 2023 10:33 AM Vivian Z wrote: Medication: clonazePAM  (KLONOPIN ) 0.5 MG tablet  Carolynne Citron called from [facility] stating that they only received 14 day supply refill for this medication and receiving requests from continuing care facility to refill for next time. I advised that patient missed medication refill visit and she stated that she will contact the continuing care facility to let them know.  Has the patient contacted their pharmacy? Yes (Agent: If no, request that the patient contact the pharmacy for the refill. If patient does not wish to contact the pharmacy document the reason why and proceed with request.) (Agent: If yes, when and what did the pharmacy advise?)  This is the patient's preferred pharmacy:  [facility] - [place], [place] - [address] [place] [address] Phone: [phone] Fax: [phone]  Is this the correct pharmacy for this prescription? Yes If no, delete pharmacy and type the correct one.   Has the prescription been filled recently? Yes  Is the patient out of the medication? No  Has the patient been seen for an appointment in the last year OR does the patient have an upcoming appointment? No  Can we respond through MyChart? No  Agent: Please be advised that Rx refills may take up to 3 business days. We ask that you follow-up with your pharmacy.

## 2024-01-08 ENCOUNTER — Telehealth: Payer: Self-pay | Admitting: Adult Health

## 2024-01-08 NOTE — Telephone Encounter (Signed)
 May have PT/OT evaluation and treatment as needed.

## 2024-01-08 NOTE — Telephone Encounter (Signed)
-----   Message from Donnal HERO sent at 01/08/2024 10:12 AM EDT ----- [facility] - MESSAGE

## 2024-01-09 NOTE — Telephone Encounter (Signed)
 I called [facility] at [facility] I was transferred to Nurse and she stated that she has these orders and has given them to her supervisor.

## 2024-01-13 ENCOUNTER — Telehealth: Payer: Self-pay | Admitting: Nurse Practitioner

## 2024-01-13 NOTE — Telephone Encounter (Signed)
 Original message came as a fax under Media.  Printed and faxed back to [facility] with Jessica's response.

## 2024-01-13 NOTE — Telephone Encounter (Signed)
 Continue post fall protocol at facility, would recommend in office visit for evaluation if any abnormal findings noted.

## 2024-01-13 NOTE — Telephone Encounter (Signed)
-----   Message from Donnal HERO sent at 01/13/2024 10:13 AM EDT ----- [facility] - MESSAGE

## 2024-01-13 NOTE — Telephone Encounter (Signed)
 I am not sure who to call in regards to this.  Not aware of the original message.  Please Advise.

## 2024-01-14 ENCOUNTER — Telehealth: Payer: Self-pay

## 2024-01-14 NOTE — Telephone Encounter (Signed)
 Copied from CRM [id number]. Topic: Referral - Question >> Jan 13, 2024  5:30 PM Mercer PEDLAR wrote: Reason for CRM: Andrea Larson calling from [facility] stating that they received an incomplete referral for home health for PT and OT and they need visit notes.   Fax: [phone] Callback: [phone]

## 2024-01-14 NOTE — Telephone Encounter (Signed)
 I called Andrea Larson at [facility] and let her know I cannot find a referral for home health services from our providers at [facility]. She said they have cleared that up and it was another ordering provider, not from our providers.

## 2024-01-23 ENCOUNTER — Telehealth: Payer: Self-pay | Admitting: *Deleted

## 2024-01-23 NOTE — Telephone Encounter (Signed)
 Message is from [place], [place] at [facility] [phone] Fax: [phone]  Please Advise.

## 2024-01-23 NOTE — Telephone Encounter (Signed)
 Mast, Man X, NP   (Selected Message)     01/23/24 12:44 PM Please see a provider ASAP for further evaluation and treatment.  Thanks.      Message Faxed back to [place] at [facility].   Please call the Office to schedule patient an appointment. [phone]

## 2024-02-03 ENCOUNTER — Ambulatory Visit: Admitting: Family

## 2024-02-03 ENCOUNTER — Encounter: Payer: Self-pay | Admitting: Family

## 2024-02-03 VITALS — BP 118/76 | HR 100 | Temp 97.8°F | Resp 18 | Ht 62.0 in | Wt 121.4 lb

## 2024-02-03 DIAGNOSIS — J449 Chronic obstructive pulmonary disease, unspecified: Secondary | ICD-10-CM | POA: Diagnosis not present

## 2024-02-03 DIAGNOSIS — I10 Essential (primary) hypertension: Secondary | ICD-10-CM

## 2024-02-03 DIAGNOSIS — F1027 Alcohol dependence with alcohol-induced persisting dementia: Secondary | ICD-10-CM | POA: Diagnosis not present

## 2024-02-03 DIAGNOSIS — E7800 Pure hypercholesterolemia, unspecified: Secondary | ICD-10-CM

## 2024-02-03 DIAGNOSIS — F418 Other specified anxiety disorders: Secondary | ICD-10-CM | POA: Diagnosis not present

## 2024-02-03 DIAGNOSIS — F03918 Unspecified dementia, unspecified severity, with other behavioral disturbance: Secondary | ICD-10-CM

## 2024-02-03 LAB — COMPLETE METABOLIC PANEL WITHOUT GFR
AG Ratio: 1.2 (calc) (ref 1.0–2.5)
ALT: 10 U/L (ref 6–29)
AST: 17 U/L (ref 10–35)
Albumin: 3.6 g/dL (ref 3.6–5.1)
Alkaline phosphatase (APISO): 99 U/L (ref 37–153)
BUN/Creatinine Ratio: 18 (calc) (ref 6–22)
BUN: 18 mg/dL (ref 7–25)
CO2: 26 mmol/L (ref 20–32)
Calcium: 8.8 mg/dL (ref 8.6–10.4)
Chloride: 101 mmol/L (ref 98–110)
Creat: 0.99 mg/dL — ABNORMAL HIGH (ref 0.60–0.95)
Globulin: 3 g/dL (ref 1.9–3.7)
Glucose, Bld: 92 mg/dL (ref 65–99)
Potassium: 5 mmol/L (ref 3.5–5.3)
Sodium: 137 mmol/L (ref 135–146)
Total Bilirubin: 0.2 mg/dL (ref 0.2–1.2)
Total Protein: 6.6 g/dL (ref 6.1–8.1)

## 2024-02-03 LAB — CBC WITH DIFFERENTIAL/PLATELET
Absolute Lymphocytes: 1515 {cells}/uL (ref 850–3900)
Absolute Monocytes: 720 {cells}/uL (ref 200–950)
Basophils Absolute: 38 {cells}/uL (ref 0–200)
Basophils Relative: 0.5 %
Eosinophils Absolute: 240 {cells}/uL (ref 15–500)
Eosinophils Relative: 3.2 %
HCT: 39.1 % (ref 35.0–45.0)
Hemoglobin: 12.1 g/dL (ref 11.7–15.5)
MCH: 27.4 pg (ref 27.0–33.0)
MCHC: 30.9 g/dL — ABNORMAL LOW (ref 32.0–36.0)
MCV: 88.7 fL (ref 80.0–100.0)
MPV: 10.3 fL (ref 7.5–12.5)
Monocytes Relative: 9.6 %
Neutro Abs: 4988 {cells}/uL (ref 1500–7800)
Neutrophils Relative %: 66.5 %
Platelets: 339 Thousand/uL (ref 140–400)
RBC: 4.41 Million/uL (ref 3.80–5.10)
RDW: 13.7 % (ref 11.0–15.0)
Total Lymphocyte: 20.2 %
WBC: 7.5 Thousand/uL (ref 3.8–10.8)

## 2024-02-03 LAB — TSH: TSH: 1 m[IU]/L (ref 0.40–4.50)

## 2024-02-03 LAB — LIPID PANEL
Cholesterol: 161 mg/dL (ref <200)
HDL: 63 mg/dL (ref >=50)
LDL Cholesterol (Calc): 74 mg/dL
Non-HDL Cholesterol (Calc): 98 mg/dL (ref <130)
Total CHOL/HDL Ratio: 2.6 (calc) (ref <5.0)
Triglycerides: 158 mg/dL — ABNORMAL HIGH (ref <150)

## 2024-02-03 MED ORDER — CLONAZEPAM 0.5 MG PO TABS: 0.5000 mg | ORAL_TABLET | Freq: Two times a day (BID) | ORAL | 3 refills | Status: AC | PRN

## 2024-02-06 ENCOUNTER — Ambulatory Visit: Payer: Self-pay | Admitting: Family

## 2024-02-09 NOTE — Progress Notes (Signed)
 Provider: Roxan Plough FNP-C   Frankee Gritz, Roxan BROCKS, NP  Patient Care Team: Woodrow Drab, Roxan BROCKS, NP as PCP - General (Family Medicine) Raford Riggs, MD as PCP - Cardiology (Cardiology)  Extended Emergency Contact Information Primary Emergency Contact: [facility] Address: [address]          [place], [place] [address] [place] Work Phone: [phone] Mobile Phone: [phone] Relation: Son  Code Status:  Full Code  Goals of care: Advanced Directive information    02/03/2024    2:26 PM  Advanced Directives  Does Patient Have a Medical Advance Directive? Yes  Type of Advance Directive Living will;Out of facility DNR (pink MOST or yellow form)  Does patient want to make changes to medical advance directive? No - Patient declined     Chief Complaint  Patient presents with   Medication Management    Treatment agreement     Discussed the use of AI scribe software for clinical note transcription with the patient, who gave verbal consent to proceed.  History of Present Illness   Andrea Larson is an 78 year old female with dementia who presents for medication follow-up. She is accompanied by her caregiver.  She has a history of dementia and has experienced falls on June 6th, July 1st, and July 6th. These falls occur approximately every couple of weeks, and she does not recall the incidents. Her caregiver has noted that she tends to get into bed on the edge, which may contribute to the falls.  She is prescribed clonazepam  0.5 mg, with consideration to use it as needed rather than on a strict twice daily schedule. She experienced hallucinations and worsening dementia symptoms when clonazepam  was abruptly stopped after hip surgery, which resolved upon resuming the medication.  Her current medication regimen includes Tylenol  as needed, multivitamin, B12, aspirin , Wellbutrin , docusate, donepezil , Ensure protein supplement, Prozac  40 mg daily,  Flonase  nasal spray, folic acid , an inhaler, melatonin 3 mg, and Namenda  10 mg twice daily. She is no longer taking thiamine  or Zofran , and she has not required Miralax  for constipation.  She has a history of alcohol  use but has not consumed alcohol  in several years. Her caregiver ensures she uses only one pharmacy for her medications. She is on 1.5 liters of oxygen  and uses a walker, although her steadiness varies. She participates in day programs and activities to help with her memory and sleep schedule, which has improved over the past six to nine months.    Past Medical History:  Diagnosis Date   Allergic rhinitis    Allergic rhinitis, cause unspecified 09/21/2011   Aortic stenosis 08/23/2016   Arthritis    Asthma    Asthma 09/21/2011   Colon polyps    COPD (chronic obstructive pulmonary disease) (HCC)    mild    Coronary artery calcification 08/23/2016   Dementia (HCC)    Depression with anxiety    HTN (hypertension)    Hyperlipidemia    UTI (lower urinary tract infection)    Past Surgical History:  Procedure Laterality Date   DENTAL SURGERY     pilonidial cyst     TONSILLECTOMY     TOTAL HIP ARTHROPLASTY Right 05/09/2023   Procedure: HEMI HIP ARTHROPLASTY ANTERIOR APPROACH;  Surgeon: Fidel Rogue, MD;  Location: [facility];  Service: Orthopedics;  Laterality: Right;    Allergies  Allergen Reactions   Lipitor [Atorvastatin ] Other (See Comments)    Myalgias    Allergies as of 02/03/2024  Reactions   Lipitor Geta.Gibbons ] Other (See Comments)   Myalgias        Medication List        Accurate as of February 03, 2024 11:59 PM. If you have any questions, ask your nurse or doctor.          STOP taking these medications    HYDROcodone -acetaminophen  5-325 MG tablet Commonly known as: NORCO/VICODIN Stopped by: Analissa Bayless C Dominica Kent   ondansetron  4 MG tablet Commonly known as: ZOFRAN  Stopped by: Agapito Hanway C Aidon Klemens   polyethylene glycol 17 g packet Commonly known as:  MIRALAX  / GLYCOLAX  Stopped by: Genice Kimberlin C Serenity Batley   thiamine  100 MG tablet Commonly known as: VITAMIN B1 Stopped by: Jaslyn Bansal C Irena Gaydos       TAKE these medications    acetaminophen  325 MG tablet Commonly known as: TYLENOL  Take 650 mg by mouth every 6 (six) hours as needed for mild pain or headache.   buPROPion  150 MG 24 hr tablet Commonly known as: WELLBUTRIN  XL TAKE ONE TABLET BY MOUTH DAILY What changed: when to take this   clonazePAM  0.5 MG tablet Commonly known as: KLONOPIN  Take 1 tablet (0.5 mg total) by mouth 2 (two) times daily as needed for anxiety. What changed:  when to take this reasons to take this Changed by: Brandin Dilday C Cobie Leidner   cyanocobalamin  1000 MCG tablet Commonly known as: VITAMIN B12 Take 1 tablet (1,000 mcg total) by mouth daily.   docusate sodium  100 MG capsule Commonly known as: COLACE Take 1 capsule (100 mg total) by mouth 2 (two) times daily.   donepezil  5 MG tablet Commonly known as: ARICEPT  TAKE ONE TABLET BY MOUTH AT BEDTIME   Ensure Active Liqd Take 1 Can by mouth daily.   FLUoxetine  40 MG capsule Commonly known as: PROZAC  TAKE ONE CAPSULE BY MOUTH DAILY   fluticasone  50 MCG/ACT nasal spray Commonly known as: FLONASE  USE TWO SPRAYS in each nostril ONCE DAILY.   Flutter Devi Use as directed   folic acid  1 MG tablet Commonly known as: FOLVITE  TAKE ONE TABLET BY MOUTH DAILY   ipratropium 0.02 % nebulizer solution Commonly known as: ATROVENT  NEBULIZE CONTENTS OF 1 VIAL 3 TIMES A DAY What changed: See the new instructions.   levalbuterol  0.63 MG/3ML nebulizer solution Commonly known as: XOPENEX  USE ONE vial via nebulization every SIX hours as needed FOR SHORTNESS OF BREATH OR wheezing. What changed: See the new instructions.   melatonin 3 MG Tabs tablet Take 3 mg by mouth at bedtime.   memantine  10 MG tablet Commonly known as: NAMENDA  TAKE ONE TABLET BY MOUTH TWICE DAILY What changed: when to take this   mirtazapine  15 MG  tablet Commonly known as: REMERON  TAKE ONE TABLET BY MOUTH AT BEDTIME   montelukast  10 MG tablet Commonly known as: SINGULAIR  TAKE ONE TABLET BY MOUTH DAILY   multivitamin with minerals Tabs tablet Take 1 tablet by mouth daily.   omeprazole  20 MG capsule Commonly known as: PRILOSEC  Take 2 capsules (40 mg total) by mouth daily as needed.   omeprazole  20 MG tablet Commonly known as: PRILOSEC  OTC Take 40 mg by mouth daily.   rosuvastatin  20 MG tablet Commonly known as: CRESTOR  TAKE ONE TABLET BY MOUTH DAILY What changed: when to take this   Trelegy Ellipta  200-62.5-25 MCG/ACT Aepb Generic drug: Fluticasone -Umeclidin-Vilant INHALE 1 PUFF INTO THE LUNGS ONCE DAILY AS DIRECTED. What changed: See the new instructions.        Review of Systems  Constitutional:  Negative  for appetite change, chills, fatigue, fever and unexpected weight change.  HENT:  Negative for congestion, dental problem, ear discharge, ear pain, facial swelling, hearing loss, nosebleeds, postnasal drip, rhinorrhea, sinus pressure, sinus pain, sneezing, sore throat, tinnitus and trouble swallowing.   Eyes:  Negative for pain, discharge, redness, itching and visual disturbance.  Respiratory:  Negative for cough, chest tightness, shortness of breath and wheezing.   Cardiovascular:  Negative for chest pain, palpitations and leg swelling.  Gastrointestinal:  Negative for abdominal distention, abdominal pain, blood in stool, constipation, diarrhea, nausea and vomiting.  Endocrine: Negative for cold intolerance, heat intolerance, polydipsia, polyphagia and polyuria.  Genitourinary:  Negative for difficulty urinating, dysuria, flank pain, frequency and urgency.  Musculoskeletal:  Negative for arthralgias, back pain, gait problem, joint swelling, myalgias, neck pain and neck stiffness.  Skin:  Negative for color change, pallor, rash and wound.  Neurological:  Negative for dizziness, syncope, speech difficulty, weakness,  light-headedness, numbness and headaches.  Hematological:  Does not bruise/bleed easily.  Psychiatric/Behavioral:  Negative for agitation, behavioral problems, confusion, hallucinations, self-injury, sleep disturbance and suicidal ideas. The patient is nervous/anxious.     Immunization History  Administered Date(s) Administered   Fluad Quad(high Dose 65+) 04/15/2019, 04/05/2020, 08/16/2021   Influenza Split 04/11/2011   Influenza Whole 03/31/2008   Influenza, High Dose Seasonal PF 04/29/2013, 03/18/2018, 04/04/2023   Influenza,inj,Quad PF,6+ Mos 05/07/2014   Influenza-Unspecified 05/13/2015, 04/19/2017   PFIZER(Purple Top)SARS-COV-2 Vaccination 08/01/2019, 08/23/2019, 04/25/2020   Pneumococcal Conjugate-13 05/13/2013   Pneumococcal Polysaccharide-23 03/31/2008, 08/19/2017   Td 02/10/2008   Tdap 12/18/2015, 09/22/2020, 02/26/2021   Zoster Recombinant(Shingrix) 01/25/2017   Zoster, Live 07/09/2005, 01/24/2006   Pertinent  Health Maintenance Due  Topic Date Due   INFLUENZA VACCINE  02/07/2024   DEXA SCAN  Completed   MAMMOGRAM  Discontinued   Colonoscopy  Discontinued      01/11/2023    2:03 PM 02/11/2023    3:57 PM 05/01/2023    2:23 PM 09/30/2023    3:13 PM 02/03/2024    2:24 PM  Fall Risk  Falls in the past year? 0 0 0 0 1  Was there an injury with Fall? 0 0 0 0 0  Fall Risk Category Calculator 0 0 0 0 1  Patient at Risk for Falls Due to History of fall(s) History of fall(s) History of fall(s) History of fall(s) No Fall Risks  Fall risk Follow up Falls evaluation completed Falls evaluation completed Falls evaluation completed Falls evaluation completed Falls evaluation completed   Functional Status Survey:    Vitals:   02/03/24 1437  BP: 118/76  Pulse: 100  Resp: 18  Temp: 97.8 F (36.6 C)  SpO2: 99%  Weight: 121 lb 6.4 oz (55.1 kg)  Height: 5' 2 (1.575 m)   Body mass index is 22.2 kg/m. Physical Exam  GENERAL: Alert, cooperative, well developed, no acute  distress. HEENT: Normocephalic, normal oropharynx, moist mucous membranes. CHEST: Wheezing in upper lungs. CARDIOVASCULAR: Normal heart rate and rhythm, S1 and S2 normal without murmurs. ABDOMEN: Soft, non-tender, non-distended, without organomegaly, normal bowel sounds. EXTREMITIES: Extremities normal, no cyanosis or edema. NEUROLOGICAL: Cranial nerves grossly intact, moves all extremities without gross  SKIN: No rash,no lesion or erythema   PSYCHIATRY/BEHAVIORAL: Mood stable    Labs reviewed: Recent Labs    06/03/23 0504 06/05/23 1742 02/03/24 1518  NA 135 136 137  K 4.9 4.5 5.0  CL 100 101 101  CO2 31 25 26   GLUCOSE 91 66* 92  BUN 8  10 18  CREATININE 0.86 0.94 0.99*  CALCIUM  8.3* 8.0* 8.8   Recent Labs    06/02/23 0541 06/03/23 0504 06/05/23 1914 02/03/24 1518  AST 18 24 23 17   ALT 13 16 16 10   ALKPHOS 137* 147* 148*  --   BILITOT 0.3 0.2 0.4 0.2  PROT 5.5* 5.8* 6.1* 6.6  ALBUMIN 2.4* 2.5* 2.7*  --    Recent Labs    05/29/23 1528 05/29/23 1535 06/03/23 0504 06/05/23 1742 02/03/24 1518  WBC 8.8   < > 6.7 9.1 7.5  NEUTROABS 7.0  --   --  7.0 4,988  HGB 9.7*   < > 8.6* 9.3* 12.1  HCT 31.3*   < > 28.9* 30.3* 39.1  MCV 96.6   < > 98.0 95.9 88.7  PLT 449*   < > 318 449* 339   < > = values in this interval not displayed.   Lab Results  Component Value Date   TSH 1.00 02/03/2024   Lab Results  Component Value Date   HGBA1C 4.8 05/31/2023   Lab Results  Component Value Date   CHOL 161 02/03/2024   HDL 63 02/03/2024   LDLCALC 74 02/03/2024   LDLDIRECT 124.8 09/21/2011   TRIG 158 (H) 02/03/2024   CHOLHDL 2.6 02/03/2024    Significant Diagnostic Results in last 30 days:  No results found.  Assessment/Plan  Depression with anxiety  She is here for follow-up on clonazepam  management. Her dementia and history of falls complicate medication management. Clonazepam , previously used regularly, is now considered for as-needed use due to concerns about  dependency and withdrawal symptoms. Past abrupt cessation led to hallucinations and worsening dementia, indicating withdrawal. The plan is to use clonazepam  as needed to manage anxiety and sleep disturbances, aligning with family preference to avoid daily dependency. She has been off clonazepam  for several weeks without issues. - Renew clonazepam  contract. - Prescribe clonazepam  as needed, up to twice daily. - Send prescription to [facility].  Falls She experienced falls on June 6th, July 1st, and July 6th, likely due to sleeping on the bed's edge after bathroom use. The bed is a box spring and mattress on the floor to reduce fall risk. Bed rails are considered, but concerns exist about her manipulating them, potentially increasing fall risk. Her family monitors her bed positioning to ensure she is centered on the mattress. - Monitor bed positioning to ensure she is centered on the mattress. - Consider bed rails if safe and feasible.  Dementia with behavioral disturbance Her dementia was previously exacerbated by alcohol  use and medication changes. Her schedule and participation in activities have improved over the past six to nine months, benefiting cognitive function. Participation in day programs and activities is encouraged to support memory and cognitive health. - Encourage participation in day programs and activities to support cognitive function.  COPD She exhibits wheezing in the upper lungs, noted during physical examination.  Essential Hypertension  - Blood pressure well controlled  - continue to monitor   Pure Hypercholesterolemia dietary modification and exercise as tolerated  General Health Maintenance She maintains her weight and her blood pressure is well-managed. She uses a walker for mobility, and her oxygen  is set at 1.5 liters, which she tolerates well. - Continue oxygen  therapy at 1.5 liters. - Encourage use of walker for mobility as needed.   Family/ staff  Communication: Reviewed plan of care with patient verbalized understanding   Labs/tests ordered:  - CBC with Differential/Platelet - CMP with eGFR([facility]) -  TSH - Lipid panel  Next Appointment : Return in about 6 months (around 08/05/2024) for medical mangement of chronic issues.SABRA   Spent 30 minutes of Face to face and non-face to face with patient  >50% time spent counseling; reviewing medical record; tests; labs; documentation and developing future plan of care.   Roxan JAYSON Plough, NP

## 2024-03-04 ENCOUNTER — Other Ambulatory Visit: Payer: Self-pay

## 2024-03-04 NOTE — Progress Notes (Signed)
 Updated pt med list to add aspirin  81 tablet that pt takes twice a day. Verified patient med list , papers for signature is in patients pcp Ngetich, Dinah C, NP Review and sign folder.

## 2024-03-05 DIAGNOSIS — F32A Depression, unspecified: Secondary | ICD-10-CM | POA: Diagnosis not present

## 2024-03-05 DIAGNOSIS — J4489 Other specified chronic obstructive pulmonary disease: Secondary | ICD-10-CM | POA: Diagnosis not present

## 2024-03-05 DIAGNOSIS — I350 Nonrheumatic aortic (valve) stenosis: Secondary | ICD-10-CM | POA: Diagnosis not present

## 2024-03-05 DIAGNOSIS — F0393 Unspecified dementia, unspecified severity, with mood disturbance: Secondary | ICD-10-CM | POA: Diagnosis not present

## 2024-03-05 DIAGNOSIS — I10 Essential (primary) hypertension: Secondary | ICD-10-CM | POA: Diagnosis not present

## 2024-03-05 DIAGNOSIS — F0392 Unspecified dementia, unspecified severity, with psychotic disturbance: Secondary | ICD-10-CM | POA: Diagnosis not present

## 2024-03-05 DIAGNOSIS — F0394 Unspecified dementia, unspecified severity, with anxiety: Secondary | ICD-10-CM | POA: Diagnosis not present

## 2024-03-11 ENCOUNTER — Telehealth: Payer: Self-pay

## 2024-03-11 DIAGNOSIS — F0394 Unspecified dementia, unspecified severity, with anxiety: Secondary | ICD-10-CM | POA: Diagnosis not present

## 2024-03-11 DIAGNOSIS — J4489 Other specified chronic obstructive pulmonary disease: Secondary | ICD-10-CM | POA: Diagnosis not present

## 2024-03-11 DIAGNOSIS — F32A Depression, unspecified: Secondary | ICD-10-CM | POA: Diagnosis not present

## 2024-03-11 DIAGNOSIS — F0393 Unspecified dementia, unspecified severity, with mood disturbance: Secondary | ICD-10-CM | POA: Diagnosis not present

## 2024-03-11 DIAGNOSIS — E7800 Pure hypercholesterolemia, unspecified: Secondary | ICD-10-CM | POA: Diagnosis not present

## 2024-03-11 DIAGNOSIS — F03918 Unspecified dementia, unspecified severity, with other behavioral disturbance: Secondary | ICD-10-CM | POA: Diagnosis not present

## 2024-03-11 DIAGNOSIS — F0392 Unspecified dementia, unspecified severity, with psychotic disturbance: Secondary | ICD-10-CM | POA: Diagnosis not present

## 2024-03-11 DIAGNOSIS — I10 Essential (primary) hypertension: Secondary | ICD-10-CM | POA: Diagnosis not present

## 2024-03-11 DIAGNOSIS — I350 Nonrheumatic aortic (valve) stenosis: Secondary | ICD-10-CM | POA: Diagnosis not present

## 2024-03-11 NOTE — Telephone Encounter (Signed)
 Copied from CRM #[id number]. Topic: Clinical - Home Health Verbal Orders >> Mar 11, 2024 11:39 AM Mercer PEDLAR wrote: Caller/Agency: [facility]  Callback Number: [phone] Service Requested: Physical Therapy Frequency: 1 week 9 Any new concerns about the patient? No   Spoke with Dorthea from [facility] to give permission to the okay for the Home Health Verbal Orders for through our [facility] policy.

## 2024-03-12 DIAGNOSIS — M79675 Pain in left toe(s): Secondary | ICD-10-CM | POA: Diagnosis not present

## 2024-03-13 ENCOUNTER — Telehealth: Payer: Self-pay | Admitting: *Deleted

## 2024-03-13 NOTE — Telephone Encounter (Signed)
 Melissa with [facility] notified, verbal orders given.

## 2024-03-13 NOTE — Telephone Encounter (Signed)
 Copied from CRM [id number]. Topic: Clinical - Home Health Verbal Orders >> Mar 13, 2024 12:26 PM Farrel B wrote: Caller/Agency: [facility] Number: [phone] Service Requested: Occupational Therapy Frequency: 1 week 2, 1 every 2 week 2, 1 week 1, 1 every 2 weeks 2  Any new concerns about the patient? Yes Requesting speech therapy evaluation due to decline in eating memory and safety has declined as well

## 2024-03-17 DIAGNOSIS — J4489 Other specified chronic obstructive pulmonary disease: Secondary | ICD-10-CM | POA: Diagnosis not present

## 2024-03-17 DIAGNOSIS — I350 Nonrheumatic aortic (valve) stenosis: Secondary | ICD-10-CM | POA: Diagnosis not present

## 2024-03-17 DIAGNOSIS — E7800 Pure hypercholesterolemia, unspecified: Secondary | ICD-10-CM | POA: Diagnosis not present

## 2024-03-17 DIAGNOSIS — F32A Depression, unspecified: Secondary | ICD-10-CM | POA: Diagnosis not present

## 2024-03-17 DIAGNOSIS — F0394 Unspecified dementia, unspecified severity, with anxiety: Secondary | ICD-10-CM | POA: Diagnosis not present

## 2024-03-17 DIAGNOSIS — F0392 Unspecified dementia, unspecified severity, with psychotic disturbance: Secondary | ICD-10-CM | POA: Diagnosis not present

## 2024-03-17 DIAGNOSIS — I10 Essential (primary) hypertension: Secondary | ICD-10-CM | POA: Diagnosis not present

## 2024-03-17 DIAGNOSIS — F03918 Unspecified dementia, unspecified severity, with other behavioral disturbance: Secondary | ICD-10-CM | POA: Diagnosis not present

## 2024-03-17 DIAGNOSIS — F0393 Unspecified dementia, unspecified severity, with mood disturbance: Secondary | ICD-10-CM | POA: Diagnosis not present

## 2024-03-23 DIAGNOSIS — I350 Nonrheumatic aortic (valve) stenosis: Secondary | ICD-10-CM | POA: Diagnosis not present

## 2024-03-24 ENCOUNTER — Telehealth: Payer: Self-pay

## 2024-03-24 DIAGNOSIS — F0393 Unspecified dementia, unspecified severity, with mood disturbance: Secondary | ICD-10-CM | POA: Diagnosis not present

## 2024-03-24 DIAGNOSIS — I10 Essential (primary) hypertension: Secondary | ICD-10-CM | POA: Diagnosis not present

## 2024-03-24 DIAGNOSIS — E7800 Pure hypercholesterolemia, unspecified: Secondary | ICD-10-CM | POA: Diagnosis not present

## 2024-03-24 DIAGNOSIS — F0394 Unspecified dementia, unspecified severity, with anxiety: Secondary | ICD-10-CM | POA: Diagnosis not present

## 2024-03-24 DIAGNOSIS — F32A Depression, unspecified: Secondary | ICD-10-CM | POA: Diagnosis not present

## 2024-03-24 DIAGNOSIS — F03918 Unspecified dementia, unspecified severity, with other behavioral disturbance: Secondary | ICD-10-CM | POA: Diagnosis not present

## 2024-03-24 DIAGNOSIS — J4489 Other specified chronic obstructive pulmonary disease: Secondary | ICD-10-CM | POA: Diagnosis not present

## 2024-03-24 DIAGNOSIS — I350 Nonrheumatic aortic (valve) stenosis: Secondary | ICD-10-CM | POA: Diagnosis not present

## 2024-03-24 DIAGNOSIS — F0392 Unspecified dementia, unspecified severity, with psychotic disturbance: Secondary | ICD-10-CM | POA: Diagnosis not present

## 2024-03-24 NOTE — Telephone Encounter (Signed)
 Call returned to [facility] and verbal orders authorized per [employer] standing order

## 2024-03-24 NOTE — Telephone Encounter (Signed)
 Copied from CRM [id number]. Topic: Clinical - Home Health Verbal Orders >> Mar 24, 2024 10:00 AM Miquel SAILOR wrote: Caller/Agency: Sari from [facility]  Callback Number: [phone] ok to leave detail message Service Requested: Speech Therapy Frequency: 1 week  7 week  Any new concerns about the patient? No

## 2024-03-26 ENCOUNTER — Telehealth: Payer: Self-pay | Admitting: Family

## 2024-03-26 NOTE — Telephone Encounter (Signed)
-----   Message from Donnal HERO sent at 03/26/2024  8:28 AM EDT ----- ABBOTSWOOD AT [facility] FOR PROVIDER

## 2024-03-26 NOTE — Telephone Encounter (Signed)
 Contacted patient caregiver regarding appointment. Left pt caregiver detailed message to set up an appointment. Will also be sending patient a Wellsite geologist regarding an appointment.

## 2024-03-26 NOTE — Telephone Encounter (Signed)
 Please schedule appointment to recheck high blood pressure and Fall episode.

## 2024-03-31 ENCOUNTER — Ambulatory Visit: Admitting: Family

## 2024-03-31 ENCOUNTER — Encounter: Payer: Self-pay | Admitting: Family

## 2024-03-31 VITALS — BP 122/74 | HR 94 | Temp 97.9°F | Resp 18 | Ht 62.0 in | Wt 116.0 lb

## 2024-03-31 DIAGNOSIS — J9611 Chronic respiratory failure with hypoxia: Secondary | ICD-10-CM | POA: Diagnosis not present

## 2024-03-31 DIAGNOSIS — Z23 Encounter for immunization: Secondary | ICD-10-CM | POA: Diagnosis not present

## 2024-03-31 DIAGNOSIS — R296 Repeated falls: Secondary | ICD-10-CM | POA: Diagnosis not present

## 2024-03-31 DIAGNOSIS — R634 Abnormal weight loss: Secondary | ICD-10-CM | POA: Diagnosis not present

## 2024-03-31 NOTE — Patient Instructions (Signed)
 1.Stop at check and schedule Annual Wellness Visit.

## 2024-04-01 DIAGNOSIS — F0394 Unspecified dementia, unspecified severity, with anxiety: Secondary | ICD-10-CM | POA: Diagnosis not present

## 2024-04-01 DIAGNOSIS — F03918 Unspecified dementia, unspecified severity, with other behavioral disturbance: Secondary | ICD-10-CM | POA: Diagnosis not present

## 2024-04-01 DIAGNOSIS — E7800 Pure hypercholesterolemia, unspecified: Secondary | ICD-10-CM | POA: Diagnosis not present

## 2024-04-01 DIAGNOSIS — F0392 Unspecified dementia, unspecified severity, with psychotic disturbance: Secondary | ICD-10-CM | POA: Diagnosis not present

## 2024-04-01 DIAGNOSIS — J4489 Other specified chronic obstructive pulmonary disease: Secondary | ICD-10-CM | POA: Diagnosis not present

## 2024-04-01 DIAGNOSIS — F32A Depression, unspecified: Secondary | ICD-10-CM | POA: Diagnosis not present

## 2024-04-01 DIAGNOSIS — F0393 Unspecified dementia, unspecified severity, with mood disturbance: Secondary | ICD-10-CM | POA: Diagnosis not present

## 2024-04-01 DIAGNOSIS — I350 Nonrheumatic aortic (valve) stenosis: Secondary | ICD-10-CM | POA: Diagnosis not present

## 2024-04-01 DIAGNOSIS — I10 Essential (primary) hypertension: Secondary | ICD-10-CM | POA: Diagnosis not present

## 2024-04-02 ENCOUNTER — Other Ambulatory Visit: Payer: Self-pay | Admitting: Family

## 2024-04-02 DIAGNOSIS — F0392 Unspecified dementia, unspecified severity, with psychotic disturbance: Secondary | ICD-10-CM | POA: Diagnosis not present

## 2024-04-02 DIAGNOSIS — F32A Depression, unspecified: Secondary | ICD-10-CM | POA: Diagnosis not present

## 2024-04-02 DIAGNOSIS — F0394 Unspecified dementia, unspecified severity, with anxiety: Secondary | ICD-10-CM | POA: Diagnosis not present

## 2024-04-02 DIAGNOSIS — I350 Nonrheumatic aortic (valve) stenosis: Secondary | ICD-10-CM | POA: Diagnosis not present

## 2024-04-02 DIAGNOSIS — E7800 Pure hypercholesterolemia, unspecified: Secondary | ICD-10-CM | POA: Diagnosis not present

## 2024-04-02 DIAGNOSIS — F0393 Unspecified dementia, unspecified severity, with mood disturbance: Secondary | ICD-10-CM | POA: Diagnosis not present

## 2024-04-02 DIAGNOSIS — I10 Essential (primary) hypertension: Secondary | ICD-10-CM | POA: Diagnosis not present

## 2024-04-02 DIAGNOSIS — F03918 Unspecified dementia, unspecified severity, with other behavioral disturbance: Secondary | ICD-10-CM | POA: Diagnosis not present

## 2024-04-02 MED ORDER — ASPIRIN 81 MG PO CHEW: 81.0000 mg | CHEWABLE_TABLET | Freq: Every day | ORAL | Status: AC

## 2024-04-04 DIAGNOSIS — F0394 Unspecified dementia, unspecified severity, with anxiety: Secondary | ICD-10-CM | POA: Diagnosis not present

## 2024-04-04 DIAGNOSIS — E7800 Pure hypercholesterolemia, unspecified: Secondary | ICD-10-CM | POA: Diagnosis not present

## 2024-04-04 DIAGNOSIS — I350 Nonrheumatic aortic (valve) stenosis: Secondary | ICD-10-CM | POA: Diagnosis not present

## 2024-04-04 DIAGNOSIS — F0392 Unspecified dementia, unspecified severity, with psychotic disturbance: Secondary | ICD-10-CM | POA: Diagnosis not present

## 2024-04-04 DIAGNOSIS — J4489 Other specified chronic obstructive pulmonary disease: Secondary | ICD-10-CM | POA: Diagnosis not present

## 2024-04-04 DIAGNOSIS — F0393 Unspecified dementia, unspecified severity, with mood disturbance: Secondary | ICD-10-CM | POA: Diagnosis not present

## 2024-04-04 DIAGNOSIS — F03918 Unspecified dementia, unspecified severity, with other behavioral disturbance: Secondary | ICD-10-CM | POA: Diagnosis not present

## 2024-04-04 DIAGNOSIS — F32A Depression, unspecified: Secondary | ICD-10-CM | POA: Diagnosis not present

## 2024-04-04 DIAGNOSIS — I10 Essential (primary) hypertension: Secondary | ICD-10-CM | POA: Diagnosis not present

## 2024-04-05 NOTE — Progress Notes (Signed)
 Provider: Roxan Plough FNP-C  Andron Marrazzo, Roxan BROCKS, NP  Patient Care Team: Conor Filsaime, Roxan BROCKS, NP as PCP - General (Family Medicine) Raford Riggs, MD as PCP - Cardiology (Cardiology)  Extended Emergency Contact Information Primary Emergency Contact: [facility] Address: [address]          [place], [place] [address] [place] Work Phone: [phone] Mobile Phone: [phone] Relation: Son  Code Status:  DNR Goals of care: Advanced Directive information    02/03/2024    2:26 PM  Advanced Directives  Does Patient Have a Medical Advance Directive? Yes  Type of Advance Directive Living will;Out of facility DNR (pink MOST or yellow form)  Does patient want to make changes to medical advance directive? No - Patient declined     Chief Complaint  Patient presents with   Fall    Discussed the use of AI scribe software for clinical note transcription with the patient, who gave verbal consent to proceed.  History of Present Illness   Andrea Larson is an 78 year old female who presents with a recent fall from bed.  She experienced a fall from her bed, discovered during a routine check by caregivers. She was found sitting on the floor with significant bruising on the side of her face but reported no pain. Emergency medical technicians checked her vitals, which were stable. She was agitated and refused to go to the emergency room, so she was not taken.  She has a history of falling out of bed, especially when animated during sleep. The bed is a queen size with the box spring on the floor and a regular mattress, which is not high, eliminating the need for a step stool.  She does not remember the fall and has no headache or dizziness. Her weight has decreased from 121 pounds to 116 pounds, raising concerns about her nutritional intake as she often postpones eating. She is reportedly receiving protein supplements, though their consistent  administration is uncertain.  No issues with urination, such as burning, itching, or increased frequency.  She has had physical therapy and occupational therapy in the past, but her current participation is uncertain. She uses a walker but sometimes moves short distances without it.   Past Medical History:  Diagnosis Date   Allergic rhinitis    Allergic rhinitis, cause unspecified 09/21/2011   Aortic stenosis 08/23/2016   Arthritis    Asthma    Asthma 09/21/2011   Colon polyps    COPD (chronic obstructive pulmonary disease) (HCC)    mild    Coronary artery calcification 08/23/2016   Dementia (HCC)    Depression with anxiety    HTN (hypertension)    Hyperlipidemia    UTI (lower urinary tract infection)    Past Surgical History:  Procedure Laterality Date   DENTAL SURGERY     pilonidial cyst     TONSILLECTOMY     TOTAL HIP ARTHROPLASTY Right 05/09/2023   Procedure: HEMI HIP ARTHROPLASTY ANTERIOR APPROACH;  Surgeon: Fidel Rogue, MD;  Location: [facility];  Service: Orthopedics;  Laterality: Right;    Allergies  Allergen Reactions   Lipitor [Atorvastatin ] Other (See Comments)    Myalgias    Outpatient Encounter Medications as of 03/31/2024  Medication Sig   acetaminophen  (TYLENOL ) 325 MG tablet Take 650 mg by mouth every 6 (six) hours as needed for mild pain or headache.   buPROPion  (WELLBUTRIN  XL) 150 MG 24 hr tablet TAKE ONE TABLET BY MOUTH DAILY (Patient taking differently:  Take 150 mg by mouth in the morning.)   clonazePAM  (KLONOPIN ) 0.5 MG tablet Take 1 tablet (0.5 mg total) by mouth 2 (two) times daily as needed for anxiety.   docusate sodium  (COLACE) 100 MG capsule Take 1 capsule (100 mg total) by mouth 2 (two) times daily.   donepezil  (ARICEPT ) 5 MG tablet TAKE ONE TABLET BY MOUTH AT BEDTIME (Patient taking differently: Take 5 mg by mouth at bedtime.)   FLUoxetine  (PROZAC ) 40 MG capsule TAKE ONE CAPSULE BY MOUTH DAILY   fluticasone  (FLONASE ) 50 MCG/ACT nasal spray USE  TWO SPRAYS in each nostril ONCE DAILY.   folic acid  (FOLVITE ) 1 MG tablet TAKE ONE TABLET BY MOUTH DAILY   ipratropium (ATROVENT ) 0.02 % nebulizer solution NEBULIZE CONTENTS OF 1 VIAL 3 TIMES A DAY (Patient taking differently: Take 0.5 mg by nebulization 3 (three) times daily.)   levalbuterol  (XOPENEX ) 0.63 MG/3ML nebulizer solution USE ONE vial via nebulization every SIX hours as needed FOR SHORTNESS OF BREATH OR wheezing. (Patient taking differently: Take 0.63 mg by nebulization as needed for shortness of breath or wheezing.)   melatonin 3 MG TABS tablet Take 3 mg by mouth at bedtime.   memantine  (NAMENDA ) 10 MG tablet TAKE ONE TABLET BY MOUTH TWICE DAILY (Patient taking differently: Take 10 mg by mouth in the morning and at bedtime.)   mirtazapine  (REMERON ) 15 MG tablet TAKE ONE TABLET BY MOUTH AT BEDTIME   montelukast  (SINGULAIR ) 10 MG tablet TAKE ONE TABLET BY MOUTH DAILY   Multiple Vitamin (MULTIVITAMIN WITH MINERALS) TABS tablet Take 1 tablet by mouth daily.   Nutritional Supplements (ENSURE ACTIVE) LIQD Take 1 Can by mouth daily.   omeprazole  (PRILOSEC  OTC) 20 MG tablet Take 40 mg by mouth daily.   Respiratory Therapy Supplies (FLUTTER) DEVI Use as directed   rosuvastatin  (CRESTOR ) 20 MG tablet TAKE ONE TABLET BY MOUTH DAILY (Patient taking differently: Take 20 mg by mouth at bedtime.)   thiamine  (VITAMIN B1) 100 MG tablet Take 100 mg by mouth daily.   TRELEGY ELLIPTA  200-62.5-25 MCG/ACT AEPB INHALE 1 PUFF INTO THE LUNGS ONCE DAILY AS DIRECTED. (Patient taking differently: Inhale 1 puff into the lungs in the morning.)   vitamin B-12 (CYANOCOBALAMIN ) 1000 MCG tablet Take 1 tablet (1,000 mcg total) by mouth daily.   [DISCONTINUED] aspirin  81 MG chewable tablet Chew 81 mg by mouth 2 (two) times daily.   omeprazole  (PRILOSEC ) 20 MG capsule Take 2 capsules (40 mg total) by mouth daily as needed. (Patient not taking: Reported on 03/31/2024)   No facility-administered encounter medications on file  as of 03/31/2024.    Review of Systems  Constitutional:  Negative for appetite change, chills, fatigue, fever and unexpected weight change.  HENT:  Negative for congestion, ear discharge, ear pain, hearing loss, nosebleeds, postnasal drip, rhinorrhea, sinus pressure, sinus pain, sneezing, sore throat, tinnitus and trouble swallowing.   Eyes:  Negative for pain, discharge, redness, itching and visual disturbance.  Respiratory:  Negative for cough, chest tightness, shortness of breath and wheezing.        On oxygen    Cardiovascular:  Negative for chest pain, palpitations and leg swelling.  Gastrointestinal:  Negative for abdominal distention, abdominal pain, blood in stool, constipation, diarrhea, nausea and vomiting.  Genitourinary:  Negative for difficulty urinating, dysuria, flank pain, frequency and urgency.  Musculoskeletal:  Positive for gait problem. Negative for arthralgias, back pain, joint swelling, myalgias, neck pain and neck stiffness.  Skin:  Negative for color change, pallor, rash and wound.  Neurological:  Negative for dizziness, syncope, speech difficulty, weakness, light-headedness, numbness and headaches.  Hematological:  Does not bruise/bleed easily.  Psychiatric/Behavioral:  Negative for agitation, behavioral problems, confusion, hallucinations and sleep disturbance. The patient is not nervous/anxious.     Immunization History  Administered Date(s) Administered   Fluad Quad(high Dose 65+) 04/15/2019, 04/05/2020, 08/16/2021   INFLUENZA, HIGH DOSE SEASONAL PF 04/29/2013, 03/18/2018, 04/04/2023, 03/31/2024   Influenza Split 04/11/2011   Influenza Whole 03/31/2008   Influenza,inj,Quad PF,6+ Mos 05/07/2014   Influenza-Unspecified 05/13/2015, 04/19/2017   PFIZER(Purple Top)SARS-COV-2 Vaccination 08/01/2019, 08/23/2019, 04/25/2020   Pneumococcal Conjugate-13 05/13/2013   Pneumococcal Polysaccharide-23 03/31/2008, 08/19/2017   Td 02/10/2008   Tdap 12/18/2015, 09/22/2020,  02/26/2021   Zoster Recombinant(Shingrix) 01/25/2017   Zoster, Live 07/09/2005, 01/24/2006   Pertinent  Health Maintenance Due  Topic Date Due   Influenza Vaccine  Completed   DEXA SCAN  Completed   Mammogram  Discontinued   Colonoscopy  Discontinued      02/11/2023    3:57 PM 05/01/2023    2:23 PM 09/30/2023    3:13 PM 02/03/2024    2:24 PM 03/31/2024    3:03 PM  Fall Risk  Falls in the past year? 0 0 0 1 1  Was there an injury with Fall? 0 0 0 0 1  Was there an injury with Fall? - Comments     bruises on side of face.  Fall Risk Category Calculator 0 0 0 1 2  Patient at Risk for Falls Due to History of fall(s) History of fall(s) History of fall(s) No Fall Risks History of fall(s)  Fall risk Follow up Falls evaluation completed Falls evaluation completed Falls evaluation completed Falls evaluation completed Falls evaluation completed   Functional Status Survey:    Vitals:   03/31/24 1510  BP: 122/74  Pulse: 94  Resp: 18  Temp: 97.9 F (36.6 C)  SpO2: 96%  Weight: 116 lb (52.6 kg)  Height: 5' 2 (1.575 m)   Body mass index is 21.22 kg/m. Physical Exam  VITALS: T- 97.9, SaO2- 96% MEASUREMENTS: Weight- 116. GENERAL: Alert, cooperative, well developed, no acute distress. HEENT: Normocephalic, normal oropharynx, moist mucous membranes. CHEST: Clear to auscultation bilaterally, no wheezes, rhonchi, or crackles. CARDIOVASCULAR: Normal heart rate and rhythm, S1 and S2 normal without murmurs. ABDOMEN: Soft, non-tender, non-distended, without organomegaly, normal bowel sounds. EXTREMITIES: No cyanosis or edema. MUSCULOSKELETAL: Knees normal. NEUROLOGICAL: Cranial nerves grossly intact, moves all extremities without gross motor or sensory deficit.   Labs reviewed: Recent Labs    06/03/23 0504 06/05/23 1742 02/03/24 1518  NA 135 136 137  K 4.9 4.5 5.0  CL 100 101 101  CO2 31 25 26   GLUCOSE 91 66* 92  BUN 8 10 18   CREATININE 0.86 0.94 0.99*  CALCIUM  8.3* 8.0* 8.8    Recent Labs    06/02/23 0541 06/03/23 0504 06/05/23 1914 02/03/24 1518  AST 18 24 23 17   ALT 13 16 16 10   ALKPHOS 137* 147* 148*  --   BILITOT 0.3 0.2 0.4 0.2  PROT 5.5* 5.8* 6.1* 6.6  ALBUMIN 2.4* 2.5* 2.7*  --    Recent Labs    05/29/23 1528 05/29/23 1535 06/03/23 0504 06/05/23 1742 02/03/24 1518  WBC 8.8   < > 6.7 9.1 7.5  NEUTROABS 7.0  --   --  7.0 4,988  HGB 9.7*   < > 8.6* 9.3* 12.1  HCT 31.3*   < > 28.9* 30.3* 39.1  MCV 96.6   < >  98.0 95.9 88.7  PLT 449*   < > 318 449* 339   < > = values in this interval not displayed.   Lab Results  Component Value Date   TSH 1.00 02/03/2024   Lab Results  Component Value Date   HGBA1C 4.8 05/31/2023   Lab Results  Component Value Date   CHOL 161 02/03/2024   HDL 63 02/03/2024   LDLCALC 74 02/03/2024   LDLDIRECT 124.8 09/21/2011   TRIG 158 (H) 02/03/2024   CHOLHDL 2.6 02/03/2024    Significant Diagnostic Results in last 30 days:  No results found.  Assessment/Plan  Recurrent falls Experienced a fall from bed resulting in significant facial bruising but no pain. Rolling out of bed due to being animated in sleep. Vital signs stable, no indication of UTI. Bed is low to minimize injury risk. Concerns about floor mat or hospital bed with rails due to tripping hazards. - Verify if physical therapy and occupational therapy are being provided. If not, send an order for PT/OT. - Encourage continued use of the walker for mobility. - Consider the use of a floor mat in the bedroom, but assess the risk of tripping.  Unsteady gait  Uses a walker but sometimes moves short distances without it, potentially contributing to falls. PT and OT were previously scheduled but need verification for current provision. - Verify current provision of physical therapy and occupational therapy. - Encourage consistent use of the walker for all movements.  Unintentional weight loss Weight decreased from 121 lbs to 116 lbs. Concern about  further weight loss leading to weakness. Appetite reportedly poor. Protein supplements provided but uncertain about consistent consumption. - Encourage increased food intake to prevent further weight loss. - Ensure consistent consumption of protein supplements.  Chronic hypoxemic respiratory failure on long-term oxygen  therapy Currently on 1.5 L/min of oxygen , improved from a previous requirement of 3 L/min. Oxygen  saturation well-managed at 96%. - Continue current oxygen  therapy at 1.5 L/min.  General Health Maintenance Flu vaccination administered. Shingles vaccination still due. - Administer shingles vaccine.  Follow-up Next appointment scheduled for August 05, 2024, for a six-month follow-up and blood work. Medicare visit required before the end of December 2025. - Schedule a Medicare visit before the end of December 2025, preferably as a video visit.   Family/ staff Communication: Reviewed plan of care with patient verbalized understanding   Labs/tests ordered: None   Next Appointment: Return for Annual wellness visit soon .   Total time: 20 minutes. Greater than 50% of total time spent doing patient education regarding Recurrent Falls,unsteady gait ,weight loss ,chronic hypoxia,health maintenance including symptom/medication management.   Roxan JAYSON Plough, NP

## 2024-04-07 DIAGNOSIS — F0393 Unspecified dementia, unspecified severity, with mood disturbance: Secondary | ICD-10-CM | POA: Diagnosis not present

## 2024-04-07 DIAGNOSIS — F0392 Unspecified dementia, unspecified severity, with psychotic disturbance: Secondary | ICD-10-CM | POA: Diagnosis not present

## 2024-04-07 DIAGNOSIS — I10 Essential (primary) hypertension: Secondary | ICD-10-CM | POA: Diagnosis not present

## 2024-04-07 DIAGNOSIS — I350 Nonrheumatic aortic (valve) stenosis: Secondary | ICD-10-CM | POA: Diagnosis not present

## 2024-04-07 DIAGNOSIS — F0394 Unspecified dementia, unspecified severity, with anxiety: Secondary | ICD-10-CM | POA: Diagnosis not present

## 2024-04-07 DIAGNOSIS — E7800 Pure hypercholesterolemia, unspecified: Secondary | ICD-10-CM | POA: Diagnosis not present

## 2024-04-07 DIAGNOSIS — F32A Depression, unspecified: Secondary | ICD-10-CM | POA: Diagnosis not present

## 2024-04-07 DIAGNOSIS — J4489 Other specified chronic obstructive pulmonary disease: Secondary | ICD-10-CM | POA: Diagnosis not present

## 2024-04-13 DIAGNOSIS — J4489 Other specified chronic obstructive pulmonary disease: Secondary | ICD-10-CM | POA: Diagnosis not present

## 2024-04-13 DIAGNOSIS — Z9181 History of falling: Secondary | ICD-10-CM

## 2024-04-13 DIAGNOSIS — F0392 Unspecified dementia, unspecified severity, with psychotic disturbance: Secondary | ICD-10-CM | POA: Diagnosis not present

## 2024-04-13 DIAGNOSIS — I10 Essential (primary) hypertension: Secondary | ICD-10-CM | POA: Diagnosis not present

## 2024-04-13 DIAGNOSIS — M1990 Unspecified osteoarthritis, unspecified site: Secondary | ICD-10-CM

## 2024-04-13 DIAGNOSIS — F0393 Unspecified dementia, unspecified severity, with mood disturbance: Secondary | ICD-10-CM | POA: Diagnosis not present

## 2024-04-13 DIAGNOSIS — E7800 Pure hypercholesterolemia, unspecified: Secondary | ICD-10-CM | POA: Diagnosis not present

## 2024-04-13 DIAGNOSIS — F03918 Unspecified dementia, unspecified severity, with other behavioral disturbance: Secondary | ICD-10-CM | POA: Diagnosis not present

## 2024-04-13 DIAGNOSIS — F32A Depression, unspecified: Secondary | ICD-10-CM | POA: Diagnosis not present

## 2024-04-13 DIAGNOSIS — F0394 Unspecified dementia, unspecified severity, with anxiety: Secondary | ICD-10-CM | POA: Diagnosis not present

## 2024-04-13 DIAGNOSIS — I2510 Atherosclerotic heart disease of native coronary artery without angina pectoris: Secondary | ICD-10-CM

## 2024-04-13 DIAGNOSIS — I350 Nonrheumatic aortic (valve) stenosis: Secondary | ICD-10-CM | POA: Diagnosis not present

## 2024-04-14 DIAGNOSIS — J4489 Other specified chronic obstructive pulmonary disease: Secondary | ICD-10-CM | POA: Diagnosis not present

## 2024-04-14 DIAGNOSIS — F0392 Unspecified dementia, unspecified severity, with psychotic disturbance: Secondary | ICD-10-CM | POA: Diagnosis not present

## 2024-04-14 DIAGNOSIS — F32A Depression, unspecified: Secondary | ICD-10-CM | POA: Diagnosis not present

## 2024-04-14 DIAGNOSIS — E7800 Pure hypercholesterolemia, unspecified: Secondary | ICD-10-CM | POA: Diagnosis not present

## 2024-04-14 DIAGNOSIS — F03918 Unspecified dementia, unspecified severity, with other behavioral disturbance: Secondary | ICD-10-CM | POA: Diagnosis not present

## 2024-04-14 DIAGNOSIS — I10 Essential (primary) hypertension: Secondary | ICD-10-CM | POA: Diagnosis not present

## 2024-04-14 DIAGNOSIS — F0394 Unspecified dementia, unspecified severity, with anxiety: Secondary | ICD-10-CM | POA: Diagnosis not present

## 2024-04-14 DIAGNOSIS — F0393 Unspecified dementia, unspecified severity, with mood disturbance: Secondary | ICD-10-CM | POA: Diagnosis not present

## 2024-04-16 DIAGNOSIS — F0393 Unspecified dementia, unspecified severity, with mood disturbance: Secondary | ICD-10-CM | POA: Diagnosis not present

## 2024-04-16 DIAGNOSIS — F32A Depression, unspecified: Secondary | ICD-10-CM | POA: Diagnosis not present

## 2024-04-16 DIAGNOSIS — E7800 Pure hypercholesterolemia, unspecified: Secondary | ICD-10-CM | POA: Diagnosis not present

## 2024-04-16 DIAGNOSIS — F0394 Unspecified dementia, unspecified severity, with anxiety: Secondary | ICD-10-CM | POA: Diagnosis not present

## 2024-04-16 DIAGNOSIS — F03918 Unspecified dementia, unspecified severity, with other behavioral disturbance: Secondary | ICD-10-CM | POA: Diagnosis not present

## 2024-04-16 DIAGNOSIS — I10 Essential (primary) hypertension: Secondary | ICD-10-CM | POA: Diagnosis not present

## 2024-04-16 DIAGNOSIS — I350 Nonrheumatic aortic (valve) stenosis: Secondary | ICD-10-CM | POA: Diagnosis not present

## 2024-04-16 DIAGNOSIS — F0392 Unspecified dementia, unspecified severity, with psychotic disturbance: Secondary | ICD-10-CM | POA: Diagnosis not present

## 2024-04-16 DIAGNOSIS — J4489 Other specified chronic obstructive pulmonary disease: Secondary | ICD-10-CM | POA: Diagnosis not present

## 2024-04-20 ENCOUNTER — Ambulatory Visit: Payer: Self-pay | Admitting: Family

## 2024-04-20 NOTE — Telephone Encounter (Signed)
 Message routed to PCP Ngetich, Roxan BROCKS, NP and Clinical Intake for tomorrow Bethany Boomer/CMA.

## 2024-04-20 NOTE — Telephone Encounter (Signed)
 FYI Only or Action Required?: Action required by provider: clinical question for provider. Please call pts son Andrea Larson back for recommendations on rash treatment.  Patient was last seen in primary care on 03/31/2024 by Ngetich, Roxan BROCKS, NP.  Called Nurse Triage reporting Rash.  Symptoms began several days ago.  Interventions attempted: Nothing.  Symptoms are: gradually worsening.  Triage Disposition: See PCP When Office is Open (Within 3 Days)  Patient/caregiver understands and will follow disposition?: No, wishes to speak with PCP  Pts son Andrea Larson (on [place]) returning missed call from [facility] provider requesting more information about pts rash. Per son, pt has dementia and is not a good historian. Son reports it is a red rash on pts left arm between the armpit and elbow crease. No open areas or drainage. Denies fever or pain. Reports moderate itching. No new changes to medications. Has not tried any topical treatments. Routing information to office for guidance on treatment. Advised to call back for worsening symptoms.

## 2024-04-20 NOTE — Telephone Encounter (Signed)
 FYI Only or Action Required?: Action required by provider: update on patient condition and requesting medication for rash under right axillary.  Patient was last seen in primary care on 03/31/2024 by Ngetich, Roxan BROCKS, NP.  Called Nurse Triage reporting Rash.  Symptoms began several days ago.  Interventions attempted: Other: unsure .  Symptoms are: gradually worsening.  Triage Disposition: See PCP When Office is Open (Within 3 Days)  Patient/caregiver understands and will follow disposition?: No, wishes to speak with PCP   [facility] # [phone] fax # [phone] Please advise if appt needed. Patient son out of town             Reason for Disposition  Localized rash present > 7 days    Present 3 days  Answer Assessment - Initial Assessment Questions Patient's care coordinator [facility] requesting medication for rash under patient's right axillary x 3 days red angry, severe itching. First call to patient and Unable to speak with patient due to dementia  and in [facility] at [facility] per patient's son on [place]. Son unaware of rash and unable to report what rash looked like  due going out of town.  Please advise if medication can be prescribed.  [facility] # [phone] , fax # [phone].     1. APPEARANCE of RASH: What does the rash look like? (e.g., blisters, dry flaky skin, red spots, redness, sores)     Per [facility] resident care coordinator, rash red angry under right axillary  2. LOCATION: Where is the rash located?      Right arm pit  3. NUMBER: How many spots are there?      Multiple rash  4. SIZE: How big are the spots? (e.g., inches, cm; or compare to size of pinhead, tip of pen, eraser, pea)      Angry red  5. ONSET: When did the rash start?      3 days ago  6. ITCHING: Does the rash itch? If Yes, ask: How bad is the itch?  (Scale 0-10; or none, mild, moderate, severe)    Severe itching  7. PAIN: Does the rash hurt? If Yes, ask:  How bad is the pain?  (Scale 0-10; or none, mild, moderate, severe)     Na 8. OTHER SYMPTOMS: Do you have any other symptoms? (e.g., fever)     No other sx  9. PREGNANCY: Is there any chance you are pregnant? When was your last menstrual period?     na  Protocols used: Rash or Redness - Localized-A-AH

## 2024-04-21 ENCOUNTER — Ambulatory Visit: Payer: Self-pay

## 2024-04-21 DIAGNOSIS — F32A Depression, unspecified: Secondary | ICD-10-CM | POA: Diagnosis not present

## 2024-04-21 NOTE — Telephone Encounter (Signed)
 Routing to PCP office for review and pt's son requesting callback or rx ordered for pt    FYI Only or Action Required?: Action required by provider: clinical question for provider.  Patient was last seen in primary care on 03/31/2024 by Ngetich, Roxan BROCKS, NP.  Called Nurse Triage reporting Advice Only.  Symptoms began 3-4 days ago.  Interventions attempted: Nothing.  Symptoms are: gradually worsening.  Triage Disposition: No disposition on file.  Patient/caregiver understands and will follow disposition?:     Copied from CRM [id number]. Topic: Clinical - Medical Advice >> Apr 21, 2024  3:57 PM Farrel B wrote: Reason for CRM: Andrea Larson [facility]) of patient and his spouse Ms. Andrea Larson called in regard to the rash [name] the patient is experiencing. They stated they wouldn't be home until later tonight, and was wondering about the rash on the patient. The relatives stated that its a great possiblity that she has scratched until she is now bleeding and wanted to know if its anyway the provider can call her in some cortizone cream until her appt on Friday she's been like this for the past few days and was very concerned please call Mr. Andrea [place] the son to advise him, an appt has been made but they either needed the cream or soon appt [phone] (M)Andrea Larson Reason for Disposition  [1] Caller requesting NON-URGENT health information AND [2] PCP's office is the best resource  Answer Assessment - Initial Assessment Questions 1. REASON FOR CALL: What is the main reason for your call? or How can I best help you?    Called pt's son back: son does not want to bring pt in to see PCP states it is a task to get her there: asked if there is anything I could recommend to help: explained to pain I am not able to offer any assistance with that: I could give care advice but due to not knowing actually what the pt has or what is causing the rash I choose not to provide  care advice - wouldn't want to irritate area even more, etc.  Son kept stating it is just a rash - offered son a video appt and he refused, offered to move appt up to Thursday and this was refused.  Son stated at nursing home they require a rx for stuff OTC as well and would need that from PCP - informed pt that PCP wanted pt to make an acute appt, also informed I would still send his request for medication.  Son stated he going to get some type of OTC ointment, stated would like something like a steroid to help, but he will buy something and get it applied to pt and if s/s better he will cancel Fridays appt  Protocols used: Information Only Call - No Triage-A-AH

## 2024-04-21 NOTE — Telephone Encounter (Signed)
 Let a detail voicemail in regards of Ngetich, Dinah C, NP response from the previous notes from Triage Nurse from Triage Notes. Please the son to make an appointment for an acute visit per Ngetich, Roxan BROCKS, NP instruction.  Message to [employer] and Ngetich, Dinah C, NP

## 2024-04-21 NOTE — Telephone Encounter (Signed)
 Please schedule appointment to evaluate rash.

## 2024-04-21 NOTE — Telephone Encounter (Signed)
 Waiting the response from Ngetich, Dinah C, NP and reroute the message to Ngetich, Roxan BROCKS, NP

## 2024-04-22 NOTE — Telephone Encounter (Signed)
 I left a detailed message for Andrea Larson informing her that we have availability with Dr.Veludandi at the moment if he would for his mother to be seen sooner than the 17 th

## 2024-04-22 NOTE — Telephone Encounter (Signed)
 I really recommend office visit to visualize type of rash prior to prescribing medication.

## 2024-04-24 ENCOUNTER — Ambulatory Visit: Admitting: Nurse Practitioner

## 2024-04-28 DIAGNOSIS — F0392 Unspecified dementia, unspecified severity, with psychotic disturbance: Secondary | ICD-10-CM | POA: Diagnosis not present

## 2024-04-30 DIAGNOSIS — J4489 Other specified chronic obstructive pulmonary disease: Secondary | ICD-10-CM | POA: Diagnosis not present

## 2024-04-30 DIAGNOSIS — F32A Depression, unspecified: Secondary | ICD-10-CM | POA: Diagnosis not present

## 2024-04-30 DIAGNOSIS — F03918 Unspecified dementia, unspecified severity, with other behavioral disturbance: Secondary | ICD-10-CM | POA: Diagnosis not present

## 2024-04-30 DIAGNOSIS — F0392 Unspecified dementia, unspecified severity, with psychotic disturbance: Secondary | ICD-10-CM | POA: Diagnosis not present

## 2024-04-30 DIAGNOSIS — I350 Nonrheumatic aortic (valve) stenosis: Secondary | ICD-10-CM | POA: Diagnosis not present

## 2024-04-30 DIAGNOSIS — F0393 Unspecified dementia, unspecified severity, with mood disturbance: Secondary | ICD-10-CM | POA: Diagnosis not present

## 2024-04-30 DIAGNOSIS — F0394 Unspecified dementia, unspecified severity, with anxiety: Secondary | ICD-10-CM | POA: Diagnosis not present

## 2024-04-30 DIAGNOSIS — E7800 Pure hypercholesterolemia, unspecified: Secondary | ICD-10-CM | POA: Diagnosis not present

## 2024-05-15 ENCOUNTER — Ambulatory Visit (INDEPENDENT_AMBULATORY_CARE_PROVIDER_SITE_OTHER): Admitting: Adult Health

## 2024-05-15 ENCOUNTER — Encounter: Payer: Self-pay | Admitting: Adult Health

## 2024-05-15 VITALS — BP 124/74 | HR 100 | Temp 97.9°F | Ht 62.0 in | Wt 112.4 lb

## 2024-05-15 DIAGNOSIS — B372 Candidiasis of skin and nail: Secondary | ICD-10-CM | POA: Diagnosis not present

## 2024-05-15 DIAGNOSIS — J449 Chronic obstructive pulmonary disease, unspecified: Secondary | ICD-10-CM

## 2024-05-15 DIAGNOSIS — F418 Other specified anxiety disorders: Secondary | ICD-10-CM | POA: Diagnosis not present

## 2024-05-15 DIAGNOSIS — F0390 Unspecified dementia without behavioral disturbance: Secondary | ICD-10-CM | POA: Diagnosis not present

## 2024-05-15 MED ORDER — NYSTATIN 100000 UNIT/GM EX CREA: 1.0000 | TOPICAL_CREAM | Freq: Two times a day (BID) | CUTANEOUS | 0 refills | Status: AC

## 2024-05-15 NOTE — Progress Notes (Signed)
 [facility] clinic  Provider:  Jereld Serum DNP  Code Status:  DNR  Goals of Care:     05/15/2024   10:13 AM  Advanced Directives  Does Patient Have a Medical Advance Directive? Yes  Type of Advance Directive Out of facility DNR (pink MOST or yellow form);Healthcare Power of Attorney  Does patient want to make changes to medical advance directive? No - Patient declined  Copy of Healthcare Power of Attorney in Chart? Yes - validated most recent copy scanned in chart (See row information)     Chief Complaint  Patient presents with   Rash    Located under right armpit area. Started last month, family put cortisone on it, cleared and came back.     Discussed the use of AI scribe software for clinical note transcription with the patient, who gave verbal consent to proceed.  HPI: Patient is a 78 y.o. female seen today for an acute visit for rash. She is accompanied by her daughter-in-law.  Discussed the use of AI scribe software for clinical note transcription with the patient, who gave verbal consent to proceed.  History of Present Illness   Andrea Larson is an 78 year old female with COPD and dementia who presents with a rash under her right armpit. She is accompanied by her daughter-in-law.  She has a rash under her right armpit that began over a month ago. Initially, the rash responded to cortisone cream, clearing up temporarily, but it has since returned and increased in size. The rash was itchy at the onset, though the itching has subsided somewhat with the use of cortisone cream.  She has a history of COPD and uses oxygen  continuously. She is on Trelegy 200/62.5-25 mcg/act inhaler, one puff daily in the morning. No shortness of breath or wheezing is reported.  She has dementia and is on Aricept  5 mg daily and Namenda  10 mg twice daily. Her son is her legal guardian due to her dementia diagnosis.  She has a history of depression with anxiety, for which she takes  Wellbutrin  150 mg daily and Clonazepam  0.5 mg twice daily as needed for anxiety. Her daughter-in-law notes that she has been stable with this medication regimen.  She had a hip replacement following a fall last fall and underwent rehabilitation at [facility]. She uses a walker and has limited mobility, walking only short distances within her assisted living facility.  Her nutritional intake includes drinking three Ensure or Boost drinks daily, as she has been losing weight steadily. She enjoys chocolate-flavored drinks and is encouraged to snack more to maintain her weight.    Past Medical History:  Diagnosis Date   Allergic rhinitis    Allergic rhinitis, cause unspecified 09/21/2011   Aortic stenosis 08/23/2016   Arthritis    Asthma    Asthma 09/21/2011   Colon polyps    COPD (chronic obstructive pulmonary disease) (HCC)    mild    Coronary artery calcification 08/23/2016   Dementia (HCC)    Depression with anxiety    HTN (hypertension)    Hyperlipidemia    UTI (lower urinary tract infection)     Past Surgical History:  Procedure Laterality Date   DENTAL SURGERY     pilonidial cyst     TONSILLECTOMY     TOTAL HIP ARTHROPLASTY Right 05/09/2023   Procedure: HEMI HIP ARTHROPLASTY ANTERIOR APPROACH;  Surgeon: Fidel Rogue, MD;  Location: [facility];  Service: Orthopedics;  Laterality: Right;    Allergies  Allergen Reactions  Lipitor [Atorvastatin ] Other (See Comments)    Myalgias    Outpatient Encounter Medications as of 05/15/2024  Medication Sig   acetaminophen  (TYLENOL ) 325 MG tablet Take 650 mg by mouth every 6 (six) hours as needed for mild pain or headache.   aspirin  81 MG chewable tablet Chew 1 tablet (81 mg total) by mouth daily.   buPROPion  (WELLBUTRIN  XL) 150 MG 24 hr tablet TAKE ONE TABLET BY MOUTH DAILY   clonazePAM  (KLONOPIN ) 0.5 MG tablet Take 1 tablet (0.5 mg total) by mouth 2 (two) times daily as needed for anxiety.   docusate sodium  (COLACE) 100 MG capsule Take  1 capsule (100 mg total) by mouth 2 (two) times daily.   donepezil  (ARICEPT ) 5 MG tablet TAKE ONE TABLET BY MOUTH AT BEDTIME   FLUoxetine  (PROZAC ) 40 MG capsule TAKE ONE CAPSULE BY MOUTH DAILY   fluticasone  (FLONASE ) 50 MCG/ACT nasal spray USE TWO SPRAYS in each nostril ONCE DAILY.   folic acid  (FOLVITE ) 1 MG tablet TAKE ONE TABLET BY MOUTH DAILY   ipratropium (ATROVENT ) 0.02 % nebulizer solution NEBULIZE CONTENTS OF 1 VIAL 3 TIMES A DAY (Patient taking differently: Take 0.5 mg by nebulization 3 (three) times daily.)   levalbuterol  (XOPENEX ) 0.63 MG/3ML nebulizer solution USE ONE vial via nebulization every SIX hours as needed FOR SHORTNESS OF BREATH OR wheezing. (Patient taking differently: Take 0.63 mg by nebulization as needed for shortness of breath or wheezing.)   melatonin 3 MG TABS tablet Take 3 mg by mouth at bedtime.   memantine  (NAMENDA ) 10 MG tablet TAKE ONE TABLET BY MOUTH TWICE DAILY (Patient taking differently: Take 10 mg by mouth in the morning and at bedtime.)   mirtazapine  (REMERON ) 15 MG tablet TAKE ONE TABLET BY MOUTH AT BEDTIME   montelukast  (SINGULAIR ) 10 MG tablet TAKE ONE TABLET BY MOUTH DAILY   Multiple Vitamin (MULTIVITAMIN WITH MINERALS) TABS tablet Take 1 tablet by mouth daily.   Nutritional Supplements (ENSURE ACTIVE) LIQD Take 1 Can by mouth daily.   nystatin cream (MYCOSTATIN) Apply 1 Application topically 2 (two) times daily for 21 days. Apply topically to right axilla rashes   omeprazole  (PRILOSEC  OTC) 20 MG tablet Take 40 mg by mouth daily.   Respiratory Therapy Supplies (FLUTTER) DEVI Use as directed   rosuvastatin  (CRESTOR ) 20 MG tablet TAKE ONE TABLET BY MOUTH DAILY   thiamine  (VITAMIN B1) 100 MG tablet Take 100 mg by mouth daily.   TRELEGY ELLIPTA  200-62.5-25 MCG/ACT AEPB INHALE 1 PUFF INTO THE LUNGS ONCE DAILY AS DIRECTED. (Patient taking differently: Inhale 1 puff into the lungs in the morning.)   vitamin B-12 (CYANOCOBALAMIN ) 1000 MCG tablet Take 1 tablet  (1,000 mcg total) by mouth daily.   omeprazole  (PRILOSEC ) 20 MG capsule Take 2 capsules (40 mg total) by mouth daily as needed. (Patient not taking: Reported on 05/15/2024)   No facility-administered encounter medications on file as of 05/15/2024.    Review of Systems:  Review of Systems  Constitutional:  Negative for appetite change, chills, fatigue and fever.  HENT:  Negative for congestion, hearing loss, rhinorrhea and sore throat.   Eyes: Negative.   Respiratory:  Negative for cough, shortness of breath and wheezing.   Cardiovascular:  Negative for chest pain, palpitations and leg swelling.  Gastrointestinal:  Negative for abdominal pain, constipation, diarrhea, nausea and vomiting.  Genitourinary:  Negative for dysuria.  Musculoskeletal:  Negative for arthralgias, back pain and myalgias.  Skin:  Negative for color change, rash and wound.  Neurological:  Negative for dizziness, weakness and headaches.  Psychiatric/Behavioral:  Negative for behavioral problems. The patient is not nervous/anxious.     Health Maintenance  Topic Date Due   Medicare Annual Wellness (AWV)  Never done   Zoster Vaccines- Shingrix (2 of 2) 03/22/2017   Lung Cancer Screening  05/28/2024   DTaP/Tdap/Td (5 - Td or Tdap) 02/27/2031   Pneumococcal Vaccine: 50+ Years  Completed   Influenza Vaccine  Completed   DEXA SCAN  Completed   Meningococcal B Vaccine  Aged Out   Mammogram  Discontinued   Colonoscopy  Discontinued   COVID-19 Vaccine  Discontinued   Hepatitis C Screening  Discontinued    Physical Exam: Vitals:   05/15/24 1017  BP: 124/74  Pulse: 100  Temp: 97.9 F (36.6 C)  TempSrc: Temporal  SpO2: 91%  Weight: 112 lb 6.4 oz (51 kg)  Height: 5' 2 (1.575 m)   Body mass index is 20.56 kg/m. Physical Exam Constitutional:      Appearance: Normal appearance.  HENT:     Head: Normocephalic and atraumatic.     Nose: Nose normal.     Mouth/Throat:     Mouth: Mucous membranes are moist.   Eyes:     Conjunctiva/sclera: Conjunctivae normal.  Cardiovascular:     Rate and Rhythm: Normal rate and regular rhythm.     Heart sounds: Murmur heard.  Pulmonary:     Effort: Pulmonary effort is normal.     Breath sounds: Normal breath sounds.  Abdominal:     General: Bowel sounds are normal.     Palpations: Abdomen is soft.  Musculoskeletal:        General: Normal range of motion.     Cervical back: Normal range of motion.  Skin:    General: Skin is warm and dry.     Findings: Rash present.     Comments: Right axilla erythematous rashes  Neurological:     General: No focal deficit present.     Mental Status: She is alert and oriented to person, place, and time.  Psychiatric:        Mood and Affect: Mood normal.        Behavior: Behavior normal.        Thought Content: Thought content normal.        Judgment: Judgment normal.     Labs reviewed: Basic Metabolic Panel: Recent Labs    06/03/23 0504 06/05/23 1742 02/03/24 1518  NA 135 136 137  K 4.9 4.5 5.0  CL 100 101 101  CO2 31 25 26   GLUCOSE 91 66* 92  BUN 8 10 18   CREATININE 0.86 0.94 0.99*  CALCIUM  8.3* 8.0* 8.8  TSH  --   --  1.00   Liver Function Tests: Recent Labs    06/02/23 0541 06/03/23 0504 06/05/23 1914 02/03/24 1518  AST 18 24 23 17   ALT 13 16 16 10   ALKPHOS 137* 147* 148*  --   BILITOT 0.3 0.2 0.4 0.2  PROT 5.5* 5.8* 6.1* 6.6  ALBUMIN 2.4* 2.5* 2.7*  --    No results for input(s): LIPASE, AMYLASE in the last 8760 hours. Recent Labs    05/30/23 0500 06/05/23 1914  AMMONIA 26 10   CBC: Recent Labs    05/29/23 1528 05/29/23 1535 06/03/23 0504 06/05/23 1742 02/03/24 1518  WBC 8.8   < > 6.7 9.1 7.5  NEUTROABS 7.0  --   --  7.0 4,988  HGB 9.7*   < > 8.6*  9.3* 12.1  HCT 31.3*   < > 28.9* 30.3* 39.1  MCV 96.6   < > 98.0 95.9 88.7  PLT 449*   < > 318 449* 339   < > = values in this interval not displayed.   Lipid Panel: Recent Labs    02/03/24 1518  CHOL 161  HDL 63   LDLCALC 74  TRIG 841*  CHOLHDL 2.6   Lab Results  Component Value Date   HGBA1C 4.8 05/31/2023    Procedures since last visit: No results found.  Assessment/Plan  1. Candidal skin infection (Primary) -  Recurrent yeast infection in right axilla due to moist environment. - Prescribed antifungal cream twice daily for two weeks, then as needed. - Instructed nursing staff to keep area dry during bathing. - nystatin cream (MYCOSTATIN); Apply 1 Application topically 2 (two) times daily for 21 days. Apply topically to right axilla rashes  Dispense: 42 g; Refill: 0  2. Chronic obstructive pulmonary disease, unspecified COPD type (HCC) -  Stable COPD with continuous oxygen  use, no wheezing or shortness of breath. - Continue current oxygen  therapy. - Continue Trelegy inhaler as prescribed.  3. Depression with anxiety -  Improvement in agitation with Wellbutrin  and Clonopin. - Continue Wellbutrin  150 mg daily. - Continue Clonazepam  0.5 mg twice daily as needed for anxiety.  4.  Dementia without behavioral disturbance -  Managed with Aricept  and Namenda . - Continue Aricept  5 mg daily. - Continue Namenda  10 mg twice daily.     Labs/tests ordered:  None   Return if symptoms worsen or fail to improve.  Katy Brickell Medina-Vargas, NP

## 2024-05-26 DIAGNOSIS — Z0279 Encounter for issue of other medical certificate: Secondary | ICD-10-CM

## 2024-06-11 ENCOUNTER — Encounter: Payer: Self-pay | Admitting: Family

## 2024-06-14 NOTE — Progress Notes (Signed)
   This encounter was created in error - please disregard. No show

## 2024-07-16 ENCOUNTER — Ambulatory Visit: Admitting: Family

## 2024-07-16 ENCOUNTER — Ambulatory Visit: Payer: Self-pay

## 2024-07-16 ENCOUNTER — Encounter: Payer: Self-pay | Admitting: Family

## 2024-07-16 VITALS — BP 118/88 | HR 89 | Temp 97.6°F | Resp 18 | Ht 62.0 in | Wt 114.8 lb

## 2024-07-16 DIAGNOSIS — R350 Frequency of micturition: Secondary | ICD-10-CM

## 2024-07-16 DIAGNOSIS — B372 Candidiasis of skin and nail: Secondary | ICD-10-CM | POA: Diagnosis not present

## 2024-07-16 NOTE — Telephone Encounter (Signed)
 [facility] - confused, agitated, possible UTI  FYI Only or Action Required?: FYI only for provider: appointment scheduled on 07/16/24.  Patient was last seen in primary care on 05/15/2024 by Medina-Vargas, Jereld BROCKS, NP.  Called Nurse Triage reporting Altered Mental Status.  Symptoms began several days ago.  Interventions attempted: Nothing.  Symptoms are: gradually worsening.  Triage Disposition: Call PCP Within 24 Hours  Patient/caregiver understands and will follow disposition?: Yes   Reason for Triage: possible uti, confused, agitated, son is wanting to speak with NT pt stays assisted living  Reason for Disposition  New or worsening agitation or behavior problem (e.g., change from patient's normal pattern)  Answer Assessment - Initial Assessment Questions 1. LEVEL OF CONSCIOUSNESS: How are they (the patient) acting right now? (e.g., alert-oriented, confused, lethargic, stuporous, comatose)     Very alert per son who saw her last night, but son reports the pt has been agitated and restless, more than usual  2. ONSET: When did the confusion start?  (e.g., minutes, hours, days)     2 days ago  3. PATTERN: Does this come and go, or has it been constant since it started?  Is it present now?     Constant  5. NARCOTIC MEDICINES: Have they been receiving any narcotic medications? (e.g., morphine , Vicodin)     Clonazepam   6. CAUSE: What do you think is causing the confusion?      Staff and son suspect UTI  7. OTHER SYMPTOMS: Are there any other symptoms? (e.g., difficulty breathing, fever, headache, weakness)     Denies  Answer Assessment - Initial Assessment Questions 7. SUPPORT: What type of support do you (the patient) have? Note: Document living circumstances and support (e.g., family, nursing home).     [facility] staff  Protocols used: Confusion - Delirium-A-AH, Dementia Symptoms and Questions-A-AH

## 2024-07-17 LAB — URINE CULTURE
MICRO NUMBER:: 17444005
SPECIMEN QUALITY:: ADEQUATE

## 2024-07-20 ENCOUNTER — Ambulatory Visit: Payer: Self-pay | Admitting: Family

## 2024-07-26 NOTE — Progress Notes (Signed)
 "  Provider: Rahil Passey FNP-C  Aithan Farrelly, Roxan BROCKS, NP  Patient Care Team: Charlet Harr, Roxan BROCKS, NP as PCP - General (Family Medicine) Raford Riggs, MD as PCP - Cardiology (Cardiology)  Extended Emergency Contact Information Primary Emergency Contact: [facility] Address: [address]          [place], [place] [address] [place] Work Phone: [phone] Mobile Phone: [phone] Relation: Son  Code Status:  DNR Goals of care: Advanced Directive information    06/14/2024    3:16 PM  Advanced Directives  Does Patient Have a Medical Advance Directive? Yes     Chief Complaint  Patient presents with   Agitation    Agitation/increased confusion, staff suspects     History of Present Illness   Andrea Larson is an 79 year old female with Alzheimer's dementia who presents with agitation and confusion.  She has been experiencing increased agitation and confusion, which is atypical for her usual behavior. Her daughter notes that this level of confusion is often indicative of a urinary tract infection (UTI) for her, although it is not confirmed at this time. She resides at [facility] and is not always accompanied by her family, which makes monitoring her symptoms challenging. No burning or pain during urination, and she does not experience frequent urination. She wears Depends due to methotrexate use but can make it to the toilet when needed. She recently urinated and defecated without issues. Her family notes that she has experienced similar episodes of confusion in the past, often associated with UTIs, and they have become adept at recognizing these patterns.  She has a history of a rash diagnosed as a yeast infection in October or November, which has reportedly returned. She previously used nystatin  powder cream for treatment. The rash is located on her right side, and her daughter notes that it has returned.  Her appetite is described as poor,  with her weight fluctuating slightly. She consumes Ensure daily to supplement her nutrition. Her weight has increased from 112 pounds in November to 114 pounds currently. She is on 1.5 liters of oxygen  and seems to be doing well with this level.   Past Medical History:  Diagnosis Date   Allergic rhinitis    Allergic rhinitis, cause unspecified 09/21/2011   Aortic stenosis 08/23/2016   Arthritis    Asthma    Asthma 09/21/2011   Colon polyps    COPD (chronic obstructive pulmonary disease) (HCC)    mild    Coronary artery calcification 08/23/2016   Dementia (HCC)    Depression with anxiety    HTN (hypertension)    Hyperlipidemia    UTI (lower urinary tract infection)    Past Surgical History:  Procedure Laterality Date   DENTAL SURGERY     pilonidial cyst     TONSILLECTOMY     TOTAL HIP ARTHROPLASTY Right 05/09/2023   Procedure: HEMI HIP ARTHROPLASTY ANTERIOR APPROACH;  Surgeon: Fidel Rogue, MD;  Location: [facility];  Service: Orthopedics;  Laterality: Right;    Allergies[1]  Outpatient Encounter Medications as of 07/16/2024  Medication Sig   acetaminophen  (TYLENOL ) 325 MG tablet Take 650 mg by mouth every 6 (six) hours as needed for mild pain or headache.   aspirin  81 MG chewable tablet Chew 1 tablet (81 mg total) by mouth daily.   buPROPion  (WELLBUTRIN  XL) 150 MG 24 hr tablet TAKE ONE TABLET BY MOUTH DAILY   clonazePAM  (KLONOPIN ) 0.5 MG tablet Take 1 tablet (0.5 mg total) by mouth 2 (  two) times daily as needed for anxiety.   docusate sodium  (COLACE) 100 MG capsule Take 1 capsule (100 mg total) by mouth 2 (two) times daily.   donepezil  (ARICEPT ) 5 MG tablet TAKE ONE TABLET BY MOUTH AT BEDTIME   FLUoxetine  (PROZAC ) 40 MG capsule TAKE ONE CAPSULE BY MOUTH DAILY   fluticasone  (FLONASE ) 50 MCG/ACT nasal spray USE TWO SPRAYS in each nostril ONCE DAILY.   folic acid  (FOLVITE ) 1 MG tablet TAKE ONE TABLET BY MOUTH DAILY   ipratropium (ATROVENT ) 0.02 % nebulizer solution NEBULIZE CONTENTS  OF 1 VIAL 3 TIMES A DAY (Patient taking differently: Take 0.5 mg by nebulization 3 (three) times daily.)   levalbuterol  (XOPENEX ) 0.63 MG/3ML nebulizer solution USE ONE vial via nebulization every SIX hours as needed FOR SHORTNESS OF BREATH OR wheezing. (Patient taking differently: Take 0.63 mg by nebulization as needed for shortness of breath or wheezing.)   melatonin 3 MG TABS tablet Take 3 mg by mouth at bedtime.   memantine  (NAMENDA ) 10 MG tablet TAKE ONE TABLET BY MOUTH TWICE DAILY (Patient taking differently: Take 10 mg by mouth in the morning and at bedtime.)   mirtazapine  (REMERON ) 15 MG tablet TAKE ONE TABLET BY MOUTH AT BEDTIME   montelukast  (SINGULAIR ) 10 MG tablet TAKE ONE TABLET BY MOUTH DAILY   Multiple Vitamin (MULTIVITAMIN WITH MINERALS) TABS tablet Take 1 tablet by mouth daily.   Nutritional Supplements (ENSURE ACTIVE) LIQD Take 1 Can by mouth daily.   omeprazole  (PRILOSEC  OTC) 20 MG tablet Take 40 mg by mouth daily.   omeprazole  (PRILOSEC ) 20 MG capsule Take 2 capsules (40 mg total) by mouth daily as needed.   Respiratory Therapy Supplies (FLUTTER) DEVI Use as directed   rosuvastatin  (CRESTOR ) 20 MG tablet TAKE ONE TABLET BY MOUTH DAILY   thiamine  (VITAMIN B1) 100 MG tablet Take 100 mg by mouth daily.   TRELEGY ELLIPTA  200-62.5-25 MCG/ACT AEPB INHALE 1 PUFF INTO THE LUNGS ONCE DAILY AS DIRECTED. (Patient taking differently: Inhale 1 puff into the lungs in the morning.)   vitamin B-12 (CYANOCOBALAMIN ) 1000 MCG tablet Take 1 tablet (1,000 mcg total) by mouth daily.   No facility-administered encounter medications on file as of 07/16/2024.    Review of Systems  Constitutional:  Negative for appetite change, chills, fatigue, fever and unexpected weight change.  HENT:  Negative for congestion, dental problem, ear discharge, ear pain, facial swelling, hearing loss, nosebleeds, postnasal drip, rhinorrhea, sinus pressure, sinus pain, sneezing, sore throat, tinnitus and trouble  swallowing.   Eyes:  Negative for pain, discharge, redness, itching and visual disturbance.  Respiratory:  Negative for chest tightness, shortness of breath and wheezing.        On oxygen  via nasal cannula   Cardiovascular:  Negative for chest pain, palpitations and leg swelling.  Gastrointestinal:  Negative for abdominal distention, abdominal pain, constipation, diarrhea, nausea and vomiting.  Endocrine: Negative for cold intolerance, heat intolerance, polydipsia, polyphagia and polyuria.  Genitourinary:  Negative for difficulty urinating, dysuria, flank pain, frequency and urgency.  Musculoskeletal:  Positive for gait problem. Negative for arthralgias, back pain, joint swelling, myalgias, neck pain and neck stiffness.  Skin:  Negative for color change, pallor, rash and wound.  Neurological:  Negative for dizziness, syncope, speech difficulty, weakness, light-headedness, numbness and headaches.  Hematological:  Does not bruise/bleed easily.  Psychiatric/Behavioral:  Positive for agitation. Negative for behavioral problems, confusion, hallucinations, self-injury, sleep disturbance and suicidal ideas. The patient is not nervous/anxious.     Immunization History  Administered  Date(s) Administered   Fluad Quad(high Dose 65+) 04/15/2019, 04/05/2020, 08/16/2021   INFLUENZA, HIGH DOSE SEASONAL PF 04/29/2013, 03/18/2018, 04/04/2023, 03/31/2024   Influenza Split 04/11/2011   Influenza Whole 03/31/2008   Influenza,inj,Quad PF,6+ Mos 05/07/2014   Influenza-Unspecified 05/13/2015, 04/19/2017   PFIZER(Purple Top)SARS-COV-2 Vaccination 08/01/2019, 08/23/2019, 04/25/2020   Pneumococcal Conjugate-13 05/13/2013   Pneumococcal Polysaccharide-23 03/31/2008, 08/19/2017   Td 02/10/2008   Tdap 12/18/2015, 09/22/2020, 02/26/2021   Zoster Recombinant(Shingrix) 01/25/2017   Zoster, Live 07/09/2005, 01/24/2006   Pertinent  Health Maintenance Due  Topic Date Due   Influenza Vaccine  Completed   Bone Density  Scan  Completed   Mammogram  Discontinued   Colonoscopy  Discontinued      09/30/2023    3:13 PM 02/03/2024    2:24 PM 03/31/2024    3:03 PM 06/14/2024    3:14 PM 06/14/2024    3:16 PM  Fall Risk  Falls in the past year? 0 1 1 0 --  Number of falls in past year - Comments    No show No show  Was there an injury with Fall? 0  0  1   --  Was there an injury with Fall? - Comments   bruises on side of face.   No show  Fall Risk Category Calculator 0 1 2    Patient at Risk for Falls Due to History of fall(s) No Fall Risks History of fall(s) Other (Comment) Other (Comment)  Patient at Risk for Falls Due to - Comments    No show No show  Fall risk Follow up Falls evaluation completed Falls evaluation completed Falls evaluation completed Follow up appointment      Data saved with a previous flowsheet row definition   Functional Status Survey:    Vitals:   07/16/24 1447 07/16/24 1514  BP: 118/88   Pulse: 96 89  Resp: 18   Temp: 97.6 F (36.4 C)   SpO2: 97%   Weight: 114 lb 12.8 oz (52.1 kg)   Height: 5' 2 (1.575 m)    Body mass index is 21 kg/m. Physical Exam  VITALS: T- 97.6, P- 96, BP- 118/88, SaO2- 97% MEASUREMENTS: Weight- 114. GENERAL: Alert, cooperative, well developed, no acute distress. HEENT: Normocephalic, normal oropharynx, moist mucous membranes. CHEST: Clear to auscultation bilaterally, no wheezes, rhonchi, or crackles. CARDIOVASCULAR: Normal heart rate and rhythm, S1 and S2 normal without murmurs. ABDOMEN: Soft, non-tender, non-distended, without organomegaly, normal bowel sounds. No costovertebral angle tenderness. EXTREMITIES: No cyanosis or edema. NEUROLOGICAL: Cranial nerves grossly intact, moves all extremities without gross motor or sensory deficit. SKIN: Skin dry without redness.   Labs reviewed: Recent Labs    02/03/24 1518  NA 137  K 5.0  CL 101  CO2 26  GLUCOSE 92  BUN 18  CREATININE 0.99*  CALCIUM  8.8   Recent Labs    02/03/24 1518  AST  17  ALT 10  BILITOT 0.2  PROT 6.6   Recent Labs    02/03/24 1518  WBC 7.5  NEUTROABS 4,988  HGB 12.1  HCT 39.1  MCV 88.7  PLT 339   Lab Results  Component Value Date   TSH 1.00 02/03/2024   Lab Results  Component Value Date   HGBA1C 4.8 05/31/2023   Lab Results  Component Value Date   CHOL 161 02/03/2024   HDL 63 02/03/2024   LDLCALC 74 02/03/2024   LDLDIRECT 124.8 09/21/2011   TRIG 158 (H) 02/03/2024   CHOLHDL 2.6 02/03/2024    Significant  Diagnostic Results in last 30 days:  No results found.  Assessment/Plan  Altered mental status in patient with Alzheimer's disease Increased agitation and confusion, possibly indicative of a urinary tract infection (UTI) as per past episodes. No fever, chills, or dysuria reported. Differential includes UTI due to past correlation with similar episodes. - Ordered urine analysis and culture to rule out UTI  Recurrent cutaneous candidiasis Recurrent yeast infection previously treated with nystatin  powder cream. Current examination shows no redness or rash, indicating resolution of the infection. - Ordered nystatin  powder cream for future use - Advised on maintaining dry skin and avoiding moist environments  General Health Maintenance Weight has increased slightly since last visit, indicating some improvement in nutritional status. Ensure protein drink is being consumed regularly. - Continue Ensure protein drink daily - Monitor weight and nutritional intake   Family/ staff Communication: Reviewed plan of care with patient and daughter verbalized understanding  Labs/tests ordered: None   Next Appointment: Return if symptoms worsen or fail to improve.  Total time: 24 minutes. Greater than 50% of total time spent doing patient education regarding Altered mental status in patient with Alzheimer's disease,Recurrent cutaneous candidiasis and health maintenance including symptom/medication management.   Deborra Phegley C Neiman Roots, NP     [1]  Allergies Allergen Reactions   Lipitor [Atorvastatin ] Other (See Comments)    Myalgias   "

## 2024-07-31 ENCOUNTER — Telehealth: Payer: Self-pay | Admitting: Family

## 2024-07-31 DIAGNOSIS — J449 Chronic obstructive pulmonary disease, unspecified: Secondary | ICD-10-CM

## 2024-07-31 MED ORDER — FLUTICASONE-SALMETEROL 250-50 MCG/ACT IN AEPB: 1.0000 | INHALATION_SPRAY | Freq: Two times a day (BID) | RESPIRATORY_TRACT | 3 refills | Status: AC

## 2024-07-31 NOTE — Telephone Encounter (Signed)
 Form received from patient's pharmacy states insurance does not cover Trelegy 200-62.5-25 inhaler requested order to be switched to another inhaler. Order given for Advair 250/50 mcg inhale one puff twice daily. Prescription send to pharmacy. Notify patient.

## 2024-07-31 NOTE — Telephone Encounter (Signed)
Noted. Paper work has been faxed.

## 2024-08-05 ENCOUNTER — Ambulatory Visit: Payer: Self-pay | Admitting: Family

## 2024-08-10 NOTE — Progress Notes (Signed)
   This encounter was created in error - please disregard. No show
